# Patient Record
Sex: Female | Born: 1950
Health system: Southern US, Community
[De-identification: ages and names within clinical notes are randomized; demographics above are authoritative.]

## PROBLEM LIST (undated history)

## (undated) DIAGNOSIS — M7660 Achilles tendinitis, unspecified leg: Secondary | ICD-10-CM

## (undated) DIAGNOSIS — M1990 Unspecified osteoarthritis, unspecified site: Secondary | ICD-10-CM

## (undated) DIAGNOSIS — F32A Depression, unspecified: Secondary | ICD-10-CM

## (undated) DIAGNOSIS — M545 Low back pain, unspecified: Secondary | ICD-10-CM

## (undated) DIAGNOSIS — K219 Gastro-esophageal reflux disease without esophagitis: Secondary | ICD-10-CM

## (undated) DIAGNOSIS — J45909 Unspecified asthma, uncomplicated: Secondary | ICD-10-CM

## (undated) DIAGNOSIS — G4733 Obstructive sleep apnea (adult) (pediatric): Secondary | ICD-10-CM

## (undated) DIAGNOSIS — E662 Morbid (severe) obesity with alveolar hypoventilation: Secondary | ICD-10-CM

## (undated) DIAGNOSIS — E785 Hyperlipidemia, unspecified: Secondary | ICD-10-CM

## (undated) DIAGNOSIS — I10 Essential (primary) hypertension: Secondary | ICD-10-CM

## (undated) DIAGNOSIS — Z8669 Personal history of other diseases of the nervous system and sense organs: Secondary | ICD-10-CM

## (undated) DIAGNOSIS — F329 Major depressive disorder, single episode, unspecified: Secondary | ICD-10-CM

## (undated) DIAGNOSIS — F419 Anxiety disorder, unspecified: Secondary | ICD-10-CM

## (undated) HISTORY — DX: Unspecified osteoarthritis, unspecified site: M19.90

## (undated) HISTORY — DX: Major depressive disorder, single episode, unspecified: F32.9

## (undated) HISTORY — DX: Morbid (severe) obesity with alveolar hypoventilation: E66.2

## (undated) HISTORY — DX: Low back pain, unspecified: M54.50

## (undated) HISTORY — DX: Unspecified asthma, uncomplicated: J45.909

## (undated) HISTORY — DX: Hyperlipidemia, unspecified: E78.5

## (undated) HISTORY — DX: Depression, unspecified: F32.A

## (undated) HISTORY — DX: Low back pain: M54.5

## (undated) HISTORY — DX: Achilles tendinitis, unspecified leg: M76.60

## (undated) HISTORY — DX: Anxiety disorder, unspecified: F41.9

## (undated) HISTORY — DX: Gastro-esophageal reflux disease without esophagitis: K21.9

## (undated) HISTORY — PX: TUBAL LIGATION: SHX77

## (undated) HISTORY — DX: Essential (primary) hypertension: I10

## (undated) HISTORY — DX: Obstructive sleep apnea (adult) (pediatric): G47.33

---

## 1998-04-27 ENCOUNTER — Ambulatory Visit (HOSPITAL_COMMUNITY): Admission: RE | Admit: 1998-04-27 | Discharge: 1998-04-27 | Payer: Self-pay | Admitting: Family Medicine

## 1998-05-01 ENCOUNTER — Ambulatory Visit (HOSPITAL_COMMUNITY): Admission: RE | Admit: 1998-05-01 | Discharge: 1998-05-01 | Payer: Self-pay | Admitting: Family Medicine

## 2000-11-12 ENCOUNTER — Emergency Department (HOSPITAL_COMMUNITY): Admission: EM | Admit: 2000-11-12 | Discharge: 2000-11-12 | Payer: Self-pay | Admitting: *Deleted

## 2001-06-20 ENCOUNTER — Other Ambulatory Visit: Admission: RE | Admit: 2001-06-20 | Discharge: 2001-06-20 | Payer: Self-pay | Admitting: Family Medicine

## 2002-05-22 ENCOUNTER — Encounter: Payer: Self-pay | Admitting: Family Medicine

## 2002-05-22 ENCOUNTER — Ambulatory Visit (HOSPITAL_COMMUNITY): Admission: RE | Admit: 2002-05-22 | Discharge: 2002-05-22 | Payer: Self-pay | Admitting: Family Medicine

## 2002-07-24 ENCOUNTER — Emergency Department (HOSPITAL_COMMUNITY): Admission: EM | Admit: 2002-07-24 | Discharge: 2002-07-24 | Payer: Self-pay | Admitting: *Deleted

## 2002-07-24 ENCOUNTER — Encounter: Payer: Self-pay | Admitting: Emergency Medicine

## 2004-07-25 HISTORY — PX: CARDIAC CATHETERIZATION: SHX172

## 2004-12-21 ENCOUNTER — Encounter (INDEPENDENT_AMBULATORY_CARE_PROVIDER_SITE_OTHER): Payer: Self-pay | Admitting: Internal Medicine

## 2004-12-21 ENCOUNTER — Ambulatory Visit (HOSPITAL_COMMUNITY): Admission: RE | Admit: 2004-12-21 | Discharge: 2004-12-21 | Payer: Self-pay | Admitting: Family Medicine

## 2005-03-16 ENCOUNTER — Ambulatory Visit: Payer: Self-pay | Admitting: Family Medicine

## 2005-03-24 ENCOUNTER — Encounter (INDEPENDENT_AMBULATORY_CARE_PROVIDER_SITE_OTHER): Payer: Self-pay | Admitting: Internal Medicine

## 2005-04-04 ENCOUNTER — Encounter (INDEPENDENT_AMBULATORY_CARE_PROVIDER_SITE_OTHER): Payer: Self-pay | Admitting: Internal Medicine

## 2005-04-04 ENCOUNTER — Ambulatory Visit: Payer: Self-pay | Admitting: *Deleted

## 2005-04-04 ENCOUNTER — Ambulatory Visit: Payer: Self-pay | Admitting: Family Medicine

## 2005-04-04 ENCOUNTER — Inpatient Hospital Stay (HOSPITAL_COMMUNITY): Admission: EM | Admit: 2005-04-04 | Discharge: 2005-04-08 | Payer: Self-pay | Admitting: Emergency Medicine

## 2005-04-07 ENCOUNTER — Encounter (INDEPENDENT_AMBULATORY_CARE_PROVIDER_SITE_OTHER): Payer: Self-pay | Admitting: Internal Medicine

## 2005-04-07 ENCOUNTER — Ambulatory Visit: Payer: Self-pay | Admitting: Cardiology

## 2005-04-13 ENCOUNTER — Ambulatory Visit: Payer: Self-pay | Admitting: *Deleted

## 2005-04-15 ENCOUNTER — Ambulatory Visit: Payer: Self-pay | Admitting: Family Medicine

## 2005-04-18 ENCOUNTER — Ambulatory Visit: Payer: Self-pay | Admitting: Critical Care Medicine

## 2005-05-10 ENCOUNTER — Ambulatory Visit: Payer: Self-pay | Admitting: Psychiatry

## 2005-05-15 ENCOUNTER — Ambulatory Visit: Admission: RE | Admit: 2005-05-15 | Discharge: 2005-05-15 | Payer: Self-pay | Admitting: Critical Care Medicine

## 2005-05-25 ENCOUNTER — Ambulatory Visit: Payer: Self-pay | Admitting: Pulmonary Disease

## 2005-05-26 ENCOUNTER — Ambulatory Visit: Payer: Self-pay | Admitting: Critical Care Medicine

## 2005-06-13 ENCOUNTER — Ambulatory Visit: Payer: Self-pay | Admitting: Psychiatry

## 2005-06-28 ENCOUNTER — Ambulatory Visit: Payer: Self-pay | Admitting: Critical Care Medicine

## 2005-07-04 ENCOUNTER — Ambulatory Visit: Payer: Self-pay | Admitting: Psychiatry

## 2005-08-05 ENCOUNTER — Ambulatory Visit: Payer: Self-pay | Admitting: Critical Care Medicine

## 2005-08-08 ENCOUNTER — Ambulatory Visit: Payer: Self-pay | Admitting: Pulmonary Disease

## 2005-08-08 ENCOUNTER — Encounter (INDEPENDENT_AMBULATORY_CARE_PROVIDER_SITE_OTHER): Payer: Self-pay | Admitting: Internal Medicine

## 2005-08-08 ENCOUNTER — Ambulatory Visit: Admission: RE | Admit: 2005-08-08 | Discharge: 2005-08-08 | Payer: Self-pay | Admitting: Pulmonary Disease

## 2005-08-17 ENCOUNTER — Ambulatory Visit: Payer: Self-pay | Admitting: Pulmonary Disease

## 2005-08-29 ENCOUNTER — Ambulatory Visit: Payer: Self-pay | Admitting: Psychiatry

## 2005-08-30 ENCOUNTER — Encounter (INDEPENDENT_AMBULATORY_CARE_PROVIDER_SITE_OTHER): Payer: Self-pay | Admitting: Internal Medicine

## 2005-08-30 ENCOUNTER — Emergency Department (HOSPITAL_COMMUNITY): Admission: EM | Admit: 2005-08-30 | Discharge: 2005-08-30 | Payer: Self-pay | Admitting: Emergency Medicine

## 2005-09-06 ENCOUNTER — Ambulatory Visit: Payer: Self-pay | Admitting: Family Medicine

## 2005-09-14 ENCOUNTER — Encounter (INDEPENDENT_AMBULATORY_CARE_PROVIDER_SITE_OTHER): Payer: Self-pay | Admitting: Internal Medicine

## 2005-09-17 ENCOUNTER — Encounter (INDEPENDENT_AMBULATORY_CARE_PROVIDER_SITE_OTHER): Payer: Self-pay | Admitting: Internal Medicine

## 2005-09-19 ENCOUNTER — Ambulatory Visit: Payer: Self-pay | Admitting: Orthopedic Surgery

## 2005-09-21 ENCOUNTER — Ambulatory Visit: Payer: Self-pay | Admitting: Critical Care Medicine

## 2005-10-05 ENCOUNTER — Ambulatory Visit: Payer: Self-pay | Admitting: Family Medicine

## 2005-11-03 ENCOUNTER — Encounter (INDEPENDENT_AMBULATORY_CARE_PROVIDER_SITE_OTHER): Payer: Self-pay | Admitting: Internal Medicine

## 2005-11-07 ENCOUNTER — Encounter (INDEPENDENT_AMBULATORY_CARE_PROVIDER_SITE_OTHER): Payer: Self-pay | Admitting: Internal Medicine

## 2005-11-15 ENCOUNTER — Ambulatory Visit: Payer: Self-pay | Admitting: Internal Medicine

## 2006-01-03 ENCOUNTER — Ambulatory Visit: Payer: Self-pay | Admitting: Internal Medicine

## 2006-01-09 ENCOUNTER — Ambulatory Visit: Payer: Self-pay | Admitting: Critical Care Medicine

## 2006-03-17 ENCOUNTER — Telehealth (INDEPENDENT_AMBULATORY_CARE_PROVIDER_SITE_OTHER): Payer: Self-pay | Admitting: Internal Medicine

## 2006-03-29 ENCOUNTER — Encounter (INDEPENDENT_AMBULATORY_CARE_PROVIDER_SITE_OTHER): Payer: Self-pay | Admitting: Internal Medicine

## 2006-03-30 ENCOUNTER — Ambulatory Visit (HOSPITAL_COMMUNITY): Admission: RE | Admit: 2006-03-30 | Discharge: 2006-03-30 | Payer: Self-pay | Admitting: Internal Medicine

## 2006-03-30 ENCOUNTER — Encounter (INDEPENDENT_AMBULATORY_CARE_PROVIDER_SITE_OTHER): Payer: Self-pay | Admitting: Internal Medicine

## 2006-04-04 ENCOUNTER — Ambulatory Visit: Payer: Self-pay | Admitting: Internal Medicine

## 2006-04-05 ENCOUNTER — Encounter (INDEPENDENT_AMBULATORY_CARE_PROVIDER_SITE_OTHER): Payer: Self-pay | Admitting: Internal Medicine

## 2006-04-10 ENCOUNTER — Telehealth (INDEPENDENT_AMBULATORY_CARE_PROVIDER_SITE_OTHER): Payer: Self-pay | Admitting: Internal Medicine

## 2006-04-18 ENCOUNTER — Ambulatory Visit: Payer: Self-pay | Admitting: Internal Medicine

## 2006-04-20 ENCOUNTER — Ambulatory Visit (HOSPITAL_COMMUNITY): Payer: Self-pay | Admitting: Psychiatry

## 2006-04-25 ENCOUNTER — Emergency Department (HOSPITAL_COMMUNITY): Admission: EM | Admit: 2006-04-25 | Discharge: 2006-04-25 | Payer: Self-pay | Admitting: Emergency Medicine

## 2006-04-27 ENCOUNTER — Telehealth (INDEPENDENT_AMBULATORY_CARE_PROVIDER_SITE_OTHER): Payer: Self-pay | Admitting: Internal Medicine

## 2006-08-28 ENCOUNTER — Encounter: Payer: Self-pay | Admitting: Internal Medicine

## 2006-08-28 DIAGNOSIS — F329 Major depressive disorder, single episode, unspecified: Secondary | ICD-10-CM

## 2006-08-28 DIAGNOSIS — M1990 Unspecified osteoarthritis, unspecified site: Secondary | ICD-10-CM

## 2006-08-28 DIAGNOSIS — E785 Hyperlipidemia, unspecified: Secondary | ICD-10-CM | POA: Insufficient documentation

## 2006-08-28 DIAGNOSIS — J439 Emphysema, unspecified: Secondary | ICD-10-CM

## 2006-08-28 DIAGNOSIS — I10 Essential (primary) hypertension: Secondary | ICD-10-CM | POA: Insufficient documentation

## 2006-08-28 DIAGNOSIS — F411 Generalized anxiety disorder: Secondary | ICD-10-CM

## 2006-08-28 DIAGNOSIS — K219 Gastro-esophageal reflux disease without esophagitis: Secondary | ICD-10-CM | POA: Insufficient documentation

## 2006-08-28 DIAGNOSIS — F419 Anxiety disorder, unspecified: Secondary | ICD-10-CM | POA: Insufficient documentation

## 2006-08-28 DIAGNOSIS — M545 Low back pain: Secondary | ICD-10-CM

## 2006-09-05 ENCOUNTER — Emergency Department (HOSPITAL_COMMUNITY): Admission: EM | Admit: 2006-09-05 | Discharge: 2006-09-05 | Payer: Self-pay | Admitting: Emergency Medicine

## 2006-12-05 ENCOUNTER — Encounter (INDEPENDENT_AMBULATORY_CARE_PROVIDER_SITE_OTHER): Payer: Self-pay | Admitting: Internal Medicine

## 2006-12-06 ENCOUNTER — Telehealth (INDEPENDENT_AMBULATORY_CARE_PROVIDER_SITE_OTHER): Payer: Self-pay | Admitting: Internal Medicine

## 2006-12-27 ENCOUNTER — Ambulatory Visit: Payer: Self-pay | Admitting: Critical Care Medicine

## 2007-01-04 ENCOUNTER — Ambulatory Visit: Payer: Self-pay | Admitting: Internal Medicine

## 2007-01-29 ENCOUNTER — Ambulatory Visit: Payer: Self-pay | Admitting: Pulmonary Disease

## 2007-02-21 ENCOUNTER — Ambulatory Visit: Payer: Self-pay | Admitting: Cardiovascular Disease

## 2007-02-22 ENCOUNTER — Ambulatory Visit (HOSPITAL_COMMUNITY): Admission: RE | Admit: 2007-02-22 | Discharge: 2007-02-22 | Payer: Self-pay | Admitting: Cardiovascular Disease

## 2007-02-22 ENCOUNTER — Ambulatory Visit: Payer: Self-pay | Admitting: Cardiology

## 2007-04-30 ENCOUNTER — Encounter (INDEPENDENT_AMBULATORY_CARE_PROVIDER_SITE_OTHER): Payer: Self-pay | Admitting: Internal Medicine

## 2007-06-21 ENCOUNTER — Emergency Department (HOSPITAL_COMMUNITY): Admission: EM | Admit: 2007-06-21 | Discharge: 2007-06-21 | Payer: Self-pay | Admitting: Emergency Medicine

## 2007-07-26 ENCOUNTER — Encounter: Payer: Self-pay | Admitting: Family Medicine

## 2007-10-29 ENCOUNTER — Encounter: Payer: Self-pay | Admitting: Internal Medicine

## 2007-10-29 ENCOUNTER — Ambulatory Visit: Payer: Self-pay | Admitting: Internal Medicine

## 2007-10-30 ENCOUNTER — Telehealth (INDEPENDENT_AMBULATORY_CARE_PROVIDER_SITE_OTHER): Payer: Self-pay | Admitting: *Deleted

## 2007-10-30 LAB — CONVERTED CEMR LAB
Albumin: 4 g/dL (ref 3.5–5.2)
BUN: 7 mg/dL (ref 6–23)
Calcium: 9.1 mg/dL (ref 8.4–10.5)
Creatinine, Ser: 0.76 mg/dL (ref 0.40–1.20)
Glucose, Bld: 102 mg/dL — ABNORMAL HIGH (ref 70–99)
HDL: 44 mg/dL (ref >39)
Sodium: 144 meq/L (ref 135–145)
Total CHOL/HDL Ratio: 6.8
Triglycerides: 213 mg/dL — ABNORMAL HIGH (ref <150)

## 2007-11-01 ENCOUNTER — Ambulatory Visit (HOSPITAL_COMMUNITY): Admission: RE | Admit: 2007-11-01 | Discharge: 2007-11-01 | Payer: Self-pay | Admitting: Internal Medicine

## 2007-11-06 ENCOUNTER — Telehealth (INDEPENDENT_AMBULATORY_CARE_PROVIDER_SITE_OTHER): Payer: Self-pay | Admitting: *Deleted

## 2007-11-12 ENCOUNTER — Encounter (INDEPENDENT_AMBULATORY_CARE_PROVIDER_SITE_OTHER): Payer: Self-pay | Admitting: Internal Medicine

## 2007-11-15 ENCOUNTER — Ambulatory Visit: Payer: Self-pay | Admitting: Internal Medicine

## 2007-11-20 ENCOUNTER — Telehealth (INDEPENDENT_AMBULATORY_CARE_PROVIDER_SITE_OTHER): Payer: Self-pay | Admitting: *Deleted

## 2007-11-30 ENCOUNTER — Ambulatory Visit: Payer: Self-pay | Admitting: Internal Medicine

## 2007-12-10 ENCOUNTER — Ambulatory Visit: Payer: Self-pay | Admitting: Internal Medicine

## 2007-12-11 ENCOUNTER — Encounter (INDEPENDENT_AMBULATORY_CARE_PROVIDER_SITE_OTHER): Payer: Self-pay | Admitting: Internal Medicine

## 2008-03-04 ENCOUNTER — Telehealth (INDEPENDENT_AMBULATORY_CARE_PROVIDER_SITE_OTHER): Payer: Self-pay | Admitting: *Deleted

## 2008-03-17 ENCOUNTER — Ambulatory Visit: Payer: Self-pay | Admitting: Internal Medicine

## 2008-03-18 ENCOUNTER — Ambulatory Visit: Payer: Self-pay | Admitting: Critical Care Medicine

## 2008-03-19 ENCOUNTER — Encounter: Payer: Self-pay | Admitting: Critical Care Medicine

## 2008-03-28 ENCOUNTER — Encounter: Payer: Self-pay | Admitting: Critical Care Medicine

## 2008-03-28 ENCOUNTER — Telehealth (INDEPENDENT_AMBULATORY_CARE_PROVIDER_SITE_OTHER): Payer: Self-pay | Admitting: *Deleted

## 2008-03-28 ENCOUNTER — Telehealth: Payer: Self-pay | Admitting: Critical Care Medicine

## 2008-03-28 ENCOUNTER — Ambulatory Visit: Payer: Self-pay | Admitting: Critical Care Medicine

## 2008-05-08 ENCOUNTER — Ambulatory Visit: Payer: Self-pay | Admitting: Critical Care Medicine

## 2008-05-08 DIAGNOSIS — G4733 Obstructive sleep apnea (adult) (pediatric): Secondary | ICD-10-CM

## 2008-06-03 ENCOUNTER — Ambulatory Visit: Payer: Self-pay | Admitting: Pulmonary Disease

## 2008-06-04 ENCOUNTER — Encounter (INDEPENDENT_AMBULATORY_CARE_PROVIDER_SITE_OTHER): Payer: Self-pay | Admitting: Internal Medicine

## 2008-06-09 ENCOUNTER — Ambulatory Visit: Admission: RE | Admit: 2008-06-09 | Discharge: 2008-06-09 | Payer: Self-pay | Admitting: Pulmonary Disease

## 2008-06-09 ENCOUNTER — Encounter: Payer: Self-pay | Admitting: Pulmonary Disease

## 2008-06-16 ENCOUNTER — Emergency Department (HOSPITAL_COMMUNITY): Admission: EM | Admit: 2008-06-16 | Discharge: 2008-06-16 | Payer: Self-pay | Admitting: Emergency Medicine

## 2008-06-16 ENCOUNTER — Ambulatory Visit: Payer: Self-pay | Admitting: Pulmonary Disease

## 2008-06-16 DIAGNOSIS — E662 Morbid (severe) obesity with alveolar hypoventilation: Secondary | ICD-10-CM | POA: Insufficient documentation

## 2008-06-16 DIAGNOSIS — E678 Other specified hyperalimentation: Secondary | ICD-10-CM

## 2008-06-17 ENCOUNTER — Ambulatory Visit: Payer: Self-pay | Admitting: Internal Medicine

## 2008-06-18 LAB — CONVERTED CEMR LAB
BUN: 9 mg/dL (ref 6–23)
CO2: 29 meq/L (ref 19–32)
Calcium: 9.2 mg/dL (ref 8.4–10.5)
Chloride: 99 meq/L (ref 96–112)
Creatinine, Ser: 0.91 mg/dL (ref 0.40–1.20)
Glucose, Bld: 86 mg/dL (ref 70–99)

## 2008-07-02 ENCOUNTER — Encounter: Payer: Self-pay | Admitting: Pulmonary Disease

## 2008-10-13 ENCOUNTER — Ambulatory Visit: Payer: Self-pay | Admitting: Internal Medicine

## 2008-10-13 DIAGNOSIS — B372 Candidiasis of skin and nail: Secondary | ICD-10-CM | POA: Insufficient documentation

## 2008-10-28 ENCOUNTER — Ambulatory Visit: Payer: Self-pay | Admitting: Internal Medicine

## 2008-10-28 DIAGNOSIS — M7660 Achilles tendinitis, unspecified leg: Secondary | ICD-10-CM

## 2009-01-12 ENCOUNTER — Ambulatory Visit: Payer: Self-pay | Admitting: Internal Medicine

## 2009-01-13 ENCOUNTER — Encounter (INDEPENDENT_AMBULATORY_CARE_PROVIDER_SITE_OTHER): Payer: Self-pay | Admitting: Internal Medicine

## 2009-01-13 LAB — CONVERTED CEMR LAB
AST: 13 units/L (ref 0–37)
BUN: 11 mg/dL (ref 6–23)
Calcium: 8.8 mg/dL (ref 8.4–10.5)
Creatinine, Ser: 0.86 mg/dL (ref 0.40–1.20)
HDL: 43 mg/dL (ref >39)
LDL Cholesterol: 158 mg/dL — ABNORMAL HIGH (ref 0–99)
Sodium: 142 meq/L (ref 135–145)
Total CHOL/HDL Ratio: 5.4
VLDL: 32 mg/dL (ref 0–40)

## 2009-06-08 ENCOUNTER — Telehealth (INDEPENDENT_AMBULATORY_CARE_PROVIDER_SITE_OTHER): Payer: Self-pay | Admitting: *Deleted

## 2009-07-15 ENCOUNTER — Ambulatory Visit: Payer: Self-pay | Admitting: Critical Care Medicine

## 2009-07-15 ENCOUNTER — Telehealth (INDEPENDENT_AMBULATORY_CARE_PROVIDER_SITE_OTHER): Payer: Self-pay | Admitting: *Deleted

## 2009-09-17 ENCOUNTER — Emergency Department (HOSPITAL_COMMUNITY): Admission: EM | Admit: 2009-09-17 | Discharge: 2009-09-17 | Payer: Self-pay | Admitting: Emergency Medicine

## 2009-11-03 ENCOUNTER — Telehealth (INDEPENDENT_AMBULATORY_CARE_PROVIDER_SITE_OTHER): Payer: Self-pay | Admitting: *Deleted

## 2009-12-01 ENCOUNTER — Ambulatory Visit: Payer: Self-pay | Admitting: Pulmonary Disease

## 2010-04-23 ENCOUNTER — Emergency Department (HOSPITAL_COMMUNITY)
Admission: EM | Admit: 2010-04-23 | Discharge: 2010-04-23 | Payer: Self-pay | Source: Home / Self Care | Admitting: Emergency Medicine

## 2010-07-25 ENCOUNTER — Emergency Department (HOSPITAL_COMMUNITY)
Admission: EM | Admit: 2010-07-25 | Discharge: 2010-07-25 | Payer: Self-pay | Source: Home / Self Care | Admitting: Emergency Medicine

## 2010-08-15 ENCOUNTER — Encounter: Payer: Self-pay | Admitting: Internal Medicine

## 2010-08-16 ENCOUNTER — Encounter: Payer: Self-pay | Admitting: Internal Medicine

## 2010-08-24 ENCOUNTER — Ambulatory Visit: Admit: 2010-08-24 | Payer: Self-pay | Admitting: Critical Care Medicine

## 2010-08-24 NOTE — Miscellaneous (Signed)
Summary: [facility] Mast   Imported By: Bubba Hales 12/08/2009 10:05:56  _____________________________________________________________________  External Attachment:    Type:   Image     Comment:   External Document

## 2010-08-24 NOTE — Letter (Signed)
Summary: rpc chart  rpc chart   Imported By: Eliezer Mccoy 05/10/2010 10:39:24  _____________________________________________________________________  External Attachment:    Type:   Image     Comment:   External Document

## 2010-08-24 NOTE — Letter (Signed)
Summary: CMN request for PAP/[facility]  CMN request for PAP/[facility]   Imported By: Phillis Knack 12/04/2009 08:53:05  _____________________________________________________________________  External Attachment:    Type:   Image     Comment:   External Document
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
## 2010-08-24 NOTE — Progress Notes (Signed)
Summary: Appt needed with Dr. Halford Chessman for OSA- pt returned call  Phone Note Outgoing Call   Call placed by: Francesca Jewett CMA,  November 03, 2009 8:03 AM Call placed to: Patient Summary of Call: Patient last seen 05/2008 for OSA and will need an appt with Dr. Halford Chessman. CMN received from [place].  ATC x1. Unable to leave a msg. Will try again later.Francesca Jewett [facility]  November 03, 2009 11:25 AM Initial call taken by: Francesca Jewett CMA,  November 03, 2009 8:03 AM  Follow-up for Phone Call        PT RETURNED CALL. I HAVE SCHEDULED A F/U APPT W/ VS FOR 12/01/09. Cooper Render, CNA  November 03, 2009 3:08 PM

## 2010-08-25 ENCOUNTER — Telehealth: Payer: Self-pay | Admitting: Critical Care Medicine

## 2010-09-01 NOTE — Progress Notes (Signed)
Summary: nos appt  Phone Note Call from Patient   Caller: [email or web address]  Call For: wright Summary of Call: Rsc nos from 1/31 to 3/14. Initial call taken by: Netta Neat,  August 25, 2010 9:34 AM

## 2010-10-06 ENCOUNTER — Encounter: Payer: Self-pay | Admitting: Critical Care Medicine

## 2010-10-06 ENCOUNTER — Ambulatory Visit (INDEPENDENT_AMBULATORY_CARE_PROVIDER_SITE_OTHER): Payer: Medicare Other | Admitting: Critical Care Medicine

## 2010-10-06 DIAGNOSIS — G4733 Obstructive sleep apnea (adult) (pediatric): Secondary | ICD-10-CM

## 2010-10-06 DIAGNOSIS — J449 Chronic obstructive pulmonary disease, unspecified: Secondary | ICD-10-CM

## 2010-10-06 DIAGNOSIS — J4489 Other specified chronic obstructive pulmonary disease: Secondary | ICD-10-CM

## 2010-10-06 NOTE — Patient Instructions (Addendum)
No change in medications. Return in        6 months        

## 2010-10-06 NOTE — Progress Notes (Unsigned)
  Subjective:    Patient ID: [name], female    DOB: 07/26/1950, 60 y.o.   MRN: [id number]  HPI [name] is a 60 year old African American female with a history of obstructive sleep apnea, chronic obstructive lung disease, asthmatic bronchitis.  No real chest pain.  Dyspnea is at baseline.  No real mucus.  Cough is dry.  No real wheeze.  No real edema.   Review of Systems ROS     Constitutional:   No weight loss, night sweats,  Fevers, chills, fatigue, lassitude. HEENT:   No headaches,  Difficulty swallowing,  Tooth/dental problems,  Sore throat,                No sneezing, itching, ear ache, nasal congestion, post nasal drip,   CV:  No chest pain,  Orthopnea, PND, swelling in lower extremities, anasarca, dizziness, palpitations  GI  No heartburn, indigestion, abdominal pain, nausea, vomiting, diarrhea, change in bowel habits, loss of appetite  Resp: Notes  shortness of breath with exertion but not  at rest.  No excess mucus, no productive cough,  Occ. non-productive cough,  No coughing up of blood.  No change in color of mucus.  No wheezing.  No chest wall deformity  Skin: no rash or lesions.  GU: no dysuria, change in color of urine, no urgency or frequency.  No flank pain.  MS:  Notes  joint pain  But no  swelling.  No decreased range of motion.  No back pain.  Psych:  No change in mood or affect. No depression or anxiety.  No memory loss.                    Objective:   Physical Exam   Gen: Pleasant, well-nourished, in no distress,  normal affect  ENT: No lesions,  mouth clear,  oropharynx clear, no postnasal drip  Neck: No JVD, no TMG, no carotid bruits  Lungs: No use of accessory muscles, no dullness to percussion, clear without rales or rhonchi  Cardiovascular: RRR, heart sounds normal, no murmur or gallops, no peripheral edema  Abdomen: soft and NT, no HSM,  BS normal  Musculoskeletal: No deformities, no cyanosis or  clubbing  Neuro: alert, non focal  Skin: Warm, no lesions or rashes      Assessment & Plan:

## 2010-10-11 ENCOUNTER — Emergency Department (HOSPITAL_COMMUNITY)
Admission: EM | Admit: 2010-10-11 | Discharge: 2010-10-12 | Disposition: A | Discharge status: Home / Self Care | Payer: Medicare Other | Attending: Emergency Medicine | Admitting: Emergency Medicine

## 2010-10-11 ENCOUNTER — Emergency Department (HOSPITAL_COMMUNITY): Payer: Medicare Other

## 2010-10-11 DIAGNOSIS — I10 Essential (primary) hypertension: Secondary | ICD-10-CM | POA: Insufficient documentation

## 2010-10-11 DIAGNOSIS — F411 Generalized anxiety disorder: Secondary | ICD-10-CM | POA: Insufficient documentation

## 2010-10-11 DIAGNOSIS — K219 Gastro-esophageal reflux disease without esophagitis: Secondary | ICD-10-CM | POA: Insufficient documentation

## 2010-10-11 DIAGNOSIS — M7989 Other specified soft tissue disorders: Secondary | ICD-10-CM | POA: Insufficient documentation

## 2010-10-11 DIAGNOSIS — L02619 Cutaneous abscess of unspecified foot: Secondary | ICD-10-CM | POA: Insufficient documentation

## 2010-10-11 DIAGNOSIS — L03119 Cellulitis of unspecified part of limb: Secondary | ICD-10-CM | POA: Insufficient documentation

## 2010-10-11 DIAGNOSIS — J449 Chronic obstructive pulmonary disease, unspecified: Secondary | ICD-10-CM | POA: Insufficient documentation

## 2010-10-11 DIAGNOSIS — Z862 Personal history of diseases of the blood and blood-forming organs and certain disorders involving the immune mechanism: Secondary | ICD-10-CM | POA: Insufficient documentation

## 2010-10-11 DIAGNOSIS — J4489 Other specified chronic obstructive pulmonary disease: Secondary | ICD-10-CM | POA: Insufficient documentation

## 2010-10-11 DIAGNOSIS — B353 Tinea pedis: Secondary | ICD-10-CM | POA: Insufficient documentation

## 2010-10-11 DIAGNOSIS — M79609 Pain in unspecified limb: Secondary | ICD-10-CM | POA: Insufficient documentation

## 2010-10-11 DIAGNOSIS — Z8639 Personal history of other endocrine, nutritional and metabolic disease: Secondary | ICD-10-CM | POA: Insufficient documentation

## 2010-10-11 LAB — DIFFERENTIAL
Basophils Absolute: 0.1 10*3/uL (ref 0.0–0.1)
Eosinophils Relative: 6 % — ABNORMAL HIGH (ref 0–5)
Lymphocytes Relative: 36 % (ref 12–46)
Lymphs Abs: 3.1 10*3/uL (ref 0.7–4.0)
Monocytes Absolute: 0.7 10*3/uL (ref 0.1–1.0)
Neutro Abs: 4.4 10*3/uL (ref 1.7–7.7)

## 2010-10-11 LAB — CBC
HCT: 39.6 % (ref 36.0–46.0)
Hemoglobin: 13.4 g/dL (ref 12.0–15.0)
MCHC: 33.8 g/dL (ref 30.0–36.0)
MCV: 86.7 fL (ref 78.0–100.0)
RDW: 14.3 % (ref 11.5–15.5)

## 2010-10-11 LAB — BASIC METABOLIC PANEL
Chloride: 104 mEq/L (ref 96–112)
GFR calc Af Amer: >60 mL/min (ref >60)
Potassium: 4 mEq/L (ref 3.5–5.1)
Sodium: 140 mEq/L (ref 135–145)

## 2010-10-12 NOTE — Assessment & Plan Note (Signed)
Summary: Pulmonary OV:  See EPIC note for documentation   Copy to:  Asencion Noble Primary Provider/Referring Provider:  Elsie Lincoln   History of Present Illness: 60 yo female with OSA, OHS on CPAP 7 cm and 2 liters oxygen.  She has a nasal mask.  She goes to bed at 10pm, and wakes up at 6 am.  She is using her mask for the whole night.  She uses the bathroom once at night.  She feels good in the morning.  She will sometimes get tired in the afternoon, but not as bad as before she started CPAP.  She is not having any problems with her mask.  October 06, 2010 3:27 PM See EPIC documentation for note today  Clinical Reports Reviewed:  PFT's:  03/28/2008: DLCO %Predicted:  32 FEF 25/75 %Predicted:  75 FEV1 %Predicted:  70 FVC %Predicted:  63 Post Spirometry FEF 25/75 %Predicted:  82 Post Spirometry FEV1 %Predicted:  67 Post Spirometry FVC %Predicted:  57 RV %Predicted:  48 TLC %Predicted:  59   Allergies: No Known Drug Allergies  Past Pulmonary History:  Pulmonary History: Pulmonary Function Test  Date: 03/28/2008 Height (in.): 65 Gender: Female  Pre-Spirometry  FVC     Value: 2.05 L/min   Pred: 3.23 L/min     % Pred: 63 % FEV1     Value: 1.69 L     Pred: 2.40 L     % Pred: 70 % FEV1/FVC   Value: 83 %     Pred: 74 %     FEF 25-75   Value: 2.02 L/min   Pred: 2.71 L/min     % Pred: 75 %  Post-Spirometry  FVC     Value: 1.85 L/min   Pred: 3.23 L/min     % Pred: 57 % FEV1     Value: 1.60 L     Pred: 2.40 L     % Pred: 67 % FEV1/FVC   Value: 87 %     Pred: 74 %     FEF 25-75   Value: 2.23 L/min   Pred: 2.71 L/min     % Pred: 82 %  Lung Volumes  TLC     Value: 3.03 L   % Pred: 59 % RV     Value: 0.92 L   % Pred: 48 % DLCO     Value: 9.0 %   % Pred: 32 % DLCO/VA   Value: 4.84 %   % Pred: 127 %  Comments:  severe restrictive defect and severe reduction in diffusing capacity   Other Orders: Est. Patient Level III [id number]) Spirometry w/Graph (94010)

## 2010-10-14 ENCOUNTER — Emergency Department (HOSPITAL_COMMUNITY): Payer: Medicare Other

## 2010-10-14 ENCOUNTER — Inpatient Hospital Stay (HOSPITAL_COMMUNITY)
Admission: RE | Admit: 2010-10-14 | Discharge: 2010-10-15 | DRG: 554 | Disposition: A | Discharge status: Home / Self Care | Payer: Medicare Other | Source: Ambulatory Visit | Attending: Internal Medicine | Admitting: Internal Medicine

## 2010-10-14 DIAGNOSIS — M109 Gout, unspecified: Principal | ICD-10-CM | POA: Diagnosis present

## 2010-10-14 DIAGNOSIS — J449 Chronic obstructive pulmonary disease, unspecified: Secondary | ICD-10-CM | POA: Diagnosis present

## 2010-10-14 DIAGNOSIS — I10 Essential (primary) hypertension: Secondary | ICD-10-CM | POA: Diagnosis present

## 2010-10-14 DIAGNOSIS — J4489 Other specified chronic obstructive pulmonary disease: Secondary | ICD-10-CM | POA: Diagnosis present

## 2010-10-14 DIAGNOSIS — K219 Gastro-esophageal reflux disease without esophagitis: Secondary | ICD-10-CM | POA: Diagnosis present

## 2010-10-14 DIAGNOSIS — F411 Generalized anxiety disorder: Secondary | ICD-10-CM | POA: Diagnosis present

## 2010-10-14 LAB — BASIC METABOLIC PANEL
BUN: 6 mg/dL (ref 6–23)
CO2: 30 mEq/L (ref 19–32)
Chloride: 102 mEq/L (ref 96–112)
Creatinine, Ser: 0.84 mg/dL (ref 0.4–1.2)
GFR calc Af Amer: >60 mL/min (ref >60)
Potassium: 3.8 mEq/L (ref 3.5–5.1)

## 2010-10-14 LAB — CBC
Hemoglobin: 12.9 g/dL (ref 12.0–15.0)
MCH: 28.7 pg (ref 26.0–34.0)
MCV: 87.3 fL (ref 78.0–100.0)
RBC: 4.5 MIL/uL (ref 3.87–5.11)
WBC: 8.7 10*3/uL (ref 4.0–10.5)

## 2010-10-14 LAB — DIFFERENTIAL
Lymphs Abs: 2.4 10*3/uL (ref 0.7–4.0)
Monocytes Relative: 10 % (ref 3–12)
Neutro Abs: 5 10*3/uL (ref 1.7–7.7)
Neutrophils Relative %: 58 % (ref 43–77)

## 2010-10-16 NOTE — H&P (Signed)
NAME:  [name], [name]             ACCOUNT NO.:  [id number]  MEDICAL RECORD NO.:  [id number]           PATIENT TYPE:  E  LOCATION:  APED                          FACILITY:  [facility]  PHYSICIAN:  Orvan Falconer, MD           DATE OF BIRTH:  February 01, 1951  DATE OF ADMISSION:  10/14/2010 DATE OF DISCHARGE:  LH                             HISTORY & PHYSICAL   PRIMARY CARE PHYSICIAN:  Sherrilee Gilles. Gerarda Fraction, MD  ADVANCE DIRECTIVE:  Full code.  REASON FOR ADMISSION:  Swelling of the right foot.  HISTORY OF PRESENT ILLNESS:  This is a 60 year old female with history of COPD, sleep apnea, hypertension, anxiety, gout, GERD, presented to the emergency room with complaint of right first MTP joint pain, swelling, and redness.  She was diagnosed with cellulitis by primary care physician and was given Augmentin.  She has been compliant with this medication, but the swelling has gone up.  The swelling has gotten to the ankle and extended toward the lateral malleolus as well.  Workup in emergency room showed normal white count of 8700, and that she is afebrile.  Her creatinine is normal at 0.84.  Her chest x-ray is clear. EKG showed normal sinus rhythm without any acute ST-T changes. Hospitalist was asked to admit the patient for suspicious of cellulitis.  PAST MEDICAL HISTORY:  COPD, hypertension and anxiety, GERD, gout, tendonitis, sleep apnea on 12-cm of CPAP machine.  FAMILY HISTORY:  Thyroid problem.  ALLERGIES:  No known drug allergies.  CURRENT MEDICATIONS:  Augmentin twice a day.  REVIEW OF SYSTEMS:  She has no fever, chills, nausea, vomiting, chest pain, or shortness of breath.  PHYSICAL EXAMINATION:  VITAL SIGNS:  Blood pressure 159/81, pulse of 71, respiratory rate of 20, temp 98.6. GENERAL:  She is alert and oriented and she is in no apparent distress. She has facial symmetry.  Speech is fluent.  Tongue is midline.  Uvula elevated with phonation. CARDIAC:  Revealed S1 and S2 regular.  I did  not hear any murmur, rub, or gallop. LUNGS:  Clear bilaterally without any rales. ABDOMEN:  Obese, nondistended, nontender. EXTREMITIES:  Show 1+ nonpitting edema.  She has swelling and pain with redness localized mainly of the first MTP joint.  She has swelling over the lateral malleolus and slight redness.  She has no severe erythema. The exam is consistent more with gout and cellulitis. NEUROLOGICAL AND PSYCHIATRIC:  Unremarkable as well.  OBJECTIVE FINDINGS:  Chest x-ray, low-volume without acute disease. Serum sodium 139, potassium 3.8, creatinine 0.84, glucose of 97, white count 8700, hemoglobin 12.9, platelet count of 245,000.  No uric acid performed.  IMPRESSION:  This is a 60 year old female who presented with acute swelling of the first metatarsophalangeal joint.  I think this is acute gout that is untreated.  The reason why Augmentin did not help her because I did not think she had cellulitis in the first place.  She was given vancomycin, but we will discontinue that.  We will treat her with indomethacin 50 mg t.i.d. with food along with Solu-Cortef 100 mg IV q.8 hours x3 dosages.  She will be given intravenous fluid.  Her blood pressures is elevated, and I will give her Diovan 160 mg as she had taken this before.  We will give low-dose colchicine at 0.3 mg b.i.d. Because of the acute nature of her disease, allopurinol will not be started at this time.  She is stable, will be admitted to [facility] Team 2.  She is a full code.     Orvan Falconer, MD     PL/MEDQ  D:  10/15/2010  T:  10/15/2010  Job:  [id number]  Electronically Signed by Orvan Falconer  on 10/16/2010 09:29:53 PM

## 2010-10-16 NOTE — Discharge Summary (Signed)
  NAME:  [name], [name]             ACCOUNT NO.:  [id number]  MEDICAL RECORD NO.:  [id number]           PATIENT TYPE:  I  LOCATION:  [id number]                          FACILITY:  [facility]  PHYSICIAN:  Doree Albee, M.D.DATE OF BIRTH:  01-04-1951  DATE OF ADMISSION:  10/14/2010 DATE OF DISCHARGE:  03/23/2012LH                              DISCHARGE SUMMARY   PRIMARY CARE PHYSICIAN:  Sherrilee Gilles. Gerarda Fraction, MD.  FINAL DISCHARGE DIAGNOSES: 1. Acute left first metatarsophalangeal joint gout. 2. Chronic obstructive pulmonary disease, stable. 3. Hypertension. 4. Anxiety. 5. Gastroesophageal reflux disease.  CONDITION ON DISCHARGE:  Stable.  MEDICATIONS ON DISCHARGE: 1. Colchicine 0.3 mg b.i.d. for 5 days. 2. Indomethacin 50 mg t.i.d. for 10 days. 3. Omeprazole 20 mg daily. 4. Aspirin 81 mg daily. 5. Continue and finish course of Augmentin 875 mg b.i.d. 6. Oxycodone/acetaminophen 5/325 one to two tablets every 6 hours     p.r.n.  HISTORY:  This 60 year old lady was admitted yesterday with swelling of the left foot and particularly the first MTP joint.  Please see initial history and physical examination done by Dr. Raynelle Chary.  HOSPITAL PROGRESS:  The patient was admitted and was put on intravenous steroids as well as indomethacin and colchicine.  Today in the morning, she feels much improved.  I agree that this looks like gout as opposed to cellulitis.  I think it is reasonable to finish the course of Augmentin that she was prescribed by the primary care physician, but also I am going to send her home on indomethacin and colchicine.  She says she does not tolerate steroids very well orally and this could certainly be an option if other two measures did not help.  PHYSICAL EXAMINATION:  GENERAL:  She looks well.  She is not in acute pain. VITAL SIGNS:  Temperature 98.1, blood pressure 145/78, pulse 80, saturation 96% on 2 liters of oxygen. HEART:  Heart sounds are present and  normal. LUNGS:  Lung fields clear. EXTREMITIES:  Examination of left foot shows typical gout in the first metatarsophalangeal joint.  LAB WORK:  Shows hemoglobin 12.9, white blood cell count 8.7, platelets 245.  Sodium 139, potassium 3.8, bicarbonate 30, BUN 6, creatinine 0.84.  DISPOSITION:  The patient is stable to be discharged home with the above medications and she will follow up with her primary care physician early next week.     Doree Albee, M.D.     NCG/MEDQ  D:  10/15/2010  T:  10/15/2010  Job:  [id number]  cc:   Sherrilee Gilles. Gerarda Fraction, MD Fax: [phone]  Electronically Signed by Hurshel Party M.D. on 10/16/2010 05:36:27 PM

## 2010-10-20 LAB — CULTURE, BLOOD (ROUTINE X 2)

## 2010-12-07 NOTE — Assessment & Plan Note (Signed)
[facility] OFFICE NOTE   NAME:Thinnes, MAJEL MELO                    MRN:          [id number]  DATE:02/21/2007                            DOB:          31-Aug-1950    REFERRING PHYSICIAN:  Burnett Harry. Joya Gaskins, MD, [facility]   [name] is a 60 year old patient referred by Dr. Joya Gaskins for chest  pain.   The patient sees Dr. Joya Gaskins for significant sleep apnea.  She has been  on home O2 over the last couple of years.  She has a very sedentary  lifestyle.  She is a previous smoker having quit in 2006.   The patient describes atypical chest pain.  It is sharp pain in the  center of her chest.  It is worse when she rotates her upper body and  can sometimes be worse when she coughs.   In regards to her breathing it is at baseline.  She is supposed to wear  her oxygen continuously but does not.  I do not have any recent PFT's or  ABG's on the patient.   The patient has previously had a Myoview in September 2006.   It was read as abnormal with moderate anterior wall defect.  It was done  with dobutamine due to the patient's COPD.  However, she then had a  followup heart catheterization done in September 2006 by Dr. Olevia Perches.   At the time she had totally normal coronary arteries with an EF 60%.   Note was made that she had moderate elevation in her PA pressures of  62/27 and with a wedge pressure of 20 indicating primary lung disease.   This has not been followed up.   The patient's chest pain has not been particularly progressive.  It has  been constant over the last two months.  It is not associated with  diaphoresis, syncope or palpitations.  There is no radiation.  There is  nothing that she can do to make it better other than stop the twisting  motion of her upper torso.   She has not tried nonsteroidals for it and does not take nitroglycerin.   REVIEW OF SYSTEMS:  Otherwise, negative.   PAST MEDICAL  HISTORY:  Primarily remarkable for sleep apnea and COPD,  previous smoking, hypertension, and previous tubal ligations.   ALLERGIES:  She has no known allergies.   SOCIAL HISTORY:  Her mother is still alive.  Her father died at age 79  of double pneumonia.  She had a brother who died in his 39s of diabetes.   The patient is happily married.  She has three children.  She is  extremely sedentary.  She was told not to do much exercise due to her  lung problems.  She primarily reads and watches TV.  She is  significantly overweight.  She quit smoking in 2006 and does not drink.   MEDICATIONS:  1. Atrovent 2 puffs q.i.d.  2. Nebulizers at home.  3. Oxygen at home.  She is not aware of the exact liters per minute.  4. Diovan 160.  5. Aspirin a day.   PHYSICAL  EXAMINATION:  GENERAL:  A middle-aged black female in no  distress.  Affect is appropriate.  VITAL SIGNS:  Weight is 266, respiratory rate 14, blood pressure 120/80,  pulse 72 and regular.  HEENT:  Normal.  NECK:  Supple.  There is no lymphadenopathy, no thyromegaly, no JVP  elevation, no bruits.  LUNGS:  Clear with no active wheezing and decreased diaphragmatic  motion.  HEART:  There is an S1, S2, distant heart sounds.  PMI is not palpable.  ABDOMEN:  Bowel sounds are positive.  There is no tenderness, no  hepatosplenomegaly, no hepatojugular reflux,  no AAA, no bruits.  EXTREMITIES:  Distal pulses are intact.  Femorals are +3 bilaterally and  deep.  There are no bruits.  PT's are +2.  There is trace lower  extremity edema bilaterally.  NEUROLOGICAL:  Nonfocal.  There are no focal abnormalities and no  muscular weakness.   Her EKG shows sinus rhythm with occasional PVC's, nonspecific ST-T wave  changes.   IMPRESSION:  1. Chest pain atypical not likely to be coronary disease, previously      normal catheterization in 2006.  I do not think that the patient      should have another stress test.  Her symptoms are very  typical.      She had a false positive Myoview before, and it had to be done with      dobutamine.  Clearly she is likely to have another false positive      Myoview, and I do not think she needs a heart catheterization.  2. Occasional premature ventricular contractions likely related to      chronic hypoxemia and her sleep apnea.  No evidence of malignant      arrhythmia.  No palpitations or syncope.  At this time I will just      watch this.  If she were to develop any palpitations she could have      a CardioNet.  3. Hypertension.  Continue low salt diet.  She needs to lose weight.      Continue Diovan at 160 a day.  Follow up with Dr. Jonna Munro, her      primary care M.D.  4. Sleep apnea and chronic obstructive pulmonary disease.  Follow up      with Dr. Joya Gaskins.  Continue home O2.  We will make sure that she      gets her Pneumovax and influenza vaccines.  5. Chest pain and dyspnea.  In regards to this I think it is      reasonable for her to have a 2D echocardiogram.  In particular I      would like to see if there are signs of increasing pulmonary      hypertension.  Her PA pressures were rather high during      catheterization in 2006 in the 60 range.  If there is any evidence      of cor pulmonale, I would not hesitate to refer her for repeat      heart catheterization as her pulmonary status may benefit from      trials of pulmonary vasodilators.  Outside of this, however, I do      not think that she needs any invasive evaluation for her atypical      chest pain.  6. Further recommendation will be based on the results of her      echocardiogram.     Collier Salina C. Johnsie Cancel, MD, [facility]  Electronically Signed  PCN/MedQ  DD: 02/21/2007  DT: 02/22/2007  Job #: [id number]   cc:   Burnett Harry. Joya Gaskins, MD, [facility]  Weston Settle, M.D.

## 2010-12-07 NOTE — Assessment & Plan Note (Signed)
[facility]                             PULMONARY OFFICE NOTE   NAME:[name], [name]                    MRN:          [id number]  DATE:01/29/2007                            DOB:          01-Jul-1951    I saw [name] today in followup for her obstructive sleep apnea.   I had last seen her on August 17, 2005, after she had undergone a CPAP  titration study and she was titrated to a CPAP setting of 7, with  reduction in apnea hypopnea index to 1.1. At this pressure setting, she  was observed in both REM sleep and supine sleep.   She says that she was doing well with this and her main problem with use  of her CPAP machine was that her home care company did not provide her  with a long enough tubing and with the arrangement of her bed furniture,  her machine would fall off of the bed or her mask would become  disconnected any time she tried to move. As a result, she stopped using  the machine approximately 6 months ago. She continued to use  supplemental oxygen at 2 liters at night, but still is having problems  with snoring and difficulty sleeping at night as well as excessive  daytime sleepiness during the day. In addition, she says she has gained  approximately 30 pounds. She said however, otherwise she is not having  any problems with using the mask or the machine itself, although she was  apparently not provided with a humidifier. She was using [employer] previously, but has since been switched to [employer] for  her oxygen supplies.   CURRENT MEDICATIONS:  1. Diovan 160 mg daily.  2. Lipitor once daily.  3. Oxygen 2 liters at night.  4. Protonix 40 mg daily.  5. Albuterol nebulizer every 4-6 hours as needed.   PHYSICAL EXAMINATION:  She is 272 pounds, blood pressure is 132/76,  heart rate is 74, temperature 99.3, oxygen saturation is 95% on room  air.  HEENT: There is no sinus tenderness. There is no nasal discharge. She  has a Mallampati 4 airway. There is no lymphadenopathy.  HEART: Was S1, S2.  CHEST: With fine rales throughout the bases, but no wheezing.  ABDOMEN: Obese, soft and nontender.  EXTREMITIES: There was 1+ leg edema.   IMPRESSION:  Severe obstructive sleep apnea. She had been on continuous  positive airway pressure (CPAP) therapy and apparently the only problem  she had was just not having adequate length for her tubing. The other  concern that I have right now that with her gaining weight her sleep may  be more severe. What I will do is re-establish her on continuous  positive airway pressure (CPAP) therapy with appropriate equipment as  well as with a heated humidifier. She appears to like the full face mask  and I will continue her on this for now. I will also have her undergo an  auto CPAP titration study for two weeks and then certainly if she would  need to have any further adjustments  in her CPAP setting. I had again  emphasized to her the importance of diet, exercise and weight reduction  as well as avoidance of alcohol and sedatives. Driving precautions were  re-emphasized to her as well.   I will followup with her in approximately 6-8 weeks.     Chesley Mires, MD  Electronically Signed    VS/MedQ  DD: 01/29/2007  DT: 01/30/2007  Job #: [id number]   cc:   Burnett Harry. Joya Gaskins, MD, [facility]  Nat Christen, M.D.

## 2010-12-07 NOTE — Assessment & Plan Note (Signed)
[facility]                             PULMONARY OFFICE NOTE   [name], [name]                    MRN:          [id number]  DATE:12/27/2006                            DOB:          [birth year]    Ms. Shipes is a 60 year old African American female with a history of  obstructive sleep apnea, chronic obstructive lung disease, asthmatic  bronchitis.   She is only on oxygen 2 liters at h.s. with CPAP at 7 cm h.s., Protonix  40 mg daily, Spiriva daily, Diovan 160 mg daily, Lipitor daily.   She is complaining of increased shortness of breath with exertion.   PHYSICAL EXAMINATION:  Temperature 98.0, blood pressure 120/86, pulse  70, saturation was 88% on room air.  CHEST: Showed distant breath sounds with prolonged expiratory phase. No  wheeze or rhonchi.  CARDIAC: Showed a regular rate and rhythm without S3. Normal S1, S2.  ABDOMEN: Protuberant.  EXTREMITIES: Showed no edema or clubbing.  SKIN: Was clear.   Spirometry showed significant restrictive defect with FEF 25/75, also  low at 47% predicted.   IMPRESSION:  Is that of combined restrictive defect due to obesity and  obstructive defect due to asthmatic bronchitis with chronic emphysema  and hypoxemia, severe sleep apnea.   PLAN:  Is to maintain continuous positive airway pressure (CPAP) as is  and increase oxygen to 2 liters 24 hours a day and begin Advair 250/50  one spray b.i.d. and discontinue Spiriva and see the patient back in  followup in six weeks.     Burnett Harry Joya Gaskins, MD, [facility]  Electronically Signed    PEW/MedQ  DD: 12/27/2006  DT: 12/27/2006  Job #: [id number]   cc:   Nat Christen, M.D.

## 2010-12-07 NOTE — Assessment & Plan Note (Signed)
[facility]                             PULMONARY OFFICE NOTE   NAME:[name], [name]                    MRN:          [id number]  DATE:01/04/2007                            DOB:          1951-04-09    HISTORY:  A 60 year old black female who is followed by Dr. Joya [name] with  a diagnosis of COPD with an asthmatic component.  She quit smoking in  September of 2006 and comes in today with PFTs done as recently as June  4 which indicate truncation of the expiratory loop with suspected GERD  as well per Dr. Bettina [name] notes.  However, she is not taking Protonix  consistently and when she remembers to take it, it is clearly well after  she has eaten.  She has increasing symptoms of sore throat and  congestion with a hacking cough that is minimally productive.  This  worsened after she started Advair, better since she stopped the Advair  and now is back on Spiriva.   She also reports daytime hypersomnolence.  She uses oxygen at bedtime  but feels headache and sluggishness when she wakes up and actually has  excessive daytime hypersomnolence.  She is supposed to be on CPAP but  can not tolerate any usage, oxygen 2 liters at bedtime instead.  She  denies any pleuritic pain, purulent sputum, chest pain, orthopnea, PND,  or leg swelling.   PHYSICAL EXAMINATION:  She is an obese, ambulatory black female who had  a difficult time answering any questions I asked her in a  straightforward fashion.  She is afebrile, normal vital signs, with a  weight of 269 pounds which is up about 20 pounds over the last year.  HEENT:  Remarkable for mild turbinate edema, oropharynx clear.  NECK:  Supple without cervical adenopathy, tenderness, trachea is  midline, no thyromegaly.  LUNGS:  Fields are completely clear bilaterally to auscultation and  percussion although inspiratory and expiratory time are quite short.  Regular rhythm without murmur, gallop, rub.  ABDOMEN:  Soft,  benign.  EXTREMITIES:  Warm without calf tenderness, cyanosis, clubbing, edema.   IMPRESSION:  1. I am not convinced that she actually has a reversible airflow      pattern based on her history, and also how vague she was about how      she takes her medicines.  I would rather her just use albuterol      p.r.n. and then tell us if she notices a difference before and      after and at this point stop all inhalers based on the most recent      pulmonary function test that was obtained on June 4 which shows a      normal flow volume pattern in the effort independent portion of the      curve and truncation in the early portion of the curve c/w and      upper airway source.  Miminizing her inhalers may improve her upper      airway dysfunction as well.  2. Her greatest concern presently is not dyspnea on exertion or cough,  but daytime hypersomnolence and is related to using oxygen at      bedtime without continuous positive airway pressure.  That was not      Dr. Bettina [name] intention and since she can not tolerate the      continuous positive airway pressure setting as they are I am going      to make an appointment for her to see Dr. Halford [name] as soon as possible      to try to troubleshoot what we can do to help this patient be more      compliant with her continuous positive airway pressure.   Of critical importance of course is weight loss which this patient has  not yet addressed having gained another 20 pounds over the last year.  I  will defer this issue, however, to her primary physician Dr. Truett [name].     [name]. [name] Novas, MD, [facility]  Electronically Signed    MBW/MedQ  DD: 01/04/2007  DT: 01/04/2007  Job #: [id number]   cc:   [name], M.D.

## 2010-12-07 NOTE — Procedures (Signed)
NAME:  [name], [name]             ACCOUNT NO.:  [id number]   MEDICAL RECORD NO.:  [id number]          PATIENT TYPE:  OUT   LOCATION:  SLEEP CENTER                 FACILITY:  [facility]   PHYSICIAN:  Chesley Mires, MD        DATE OF BIRTH:  03/05/1951   DATE OF STUDY:  06/09/2008                            NOCTURNAL POLYSOMNOGRAM   REFERRING PHYSICIAN:  Chesley Mires, MD   INDICATION FOR STUDY:  [name] is a 60 year old female who has a  previous history of obstructive sleep apnea.  She was not able to  tolerate CPAP therapy.  She is referred to the Sleep Lab for a repeat  CPAP titration study.   Height is 5 feet 5 inches, weight is 270 pounds, and BMI is 45.   EPWORTH SLEEPINESS SCORE:  16.   MEDICATIONS:  Ambien, Nexium, and albuterol.  The patient took an Ambien  on the night of study.   SLEEP ARCHITECTURE:  Total recording time was 510 minutes, total sleep  time was 380 minutes, sleep efficiency was 74%, sleep latency was 19  minutes, and REM latency was 69 minutes.  The patient was observed in  all stages of sleep and the patient slept in both the supine and non-  supine position.   RESPIRATORY DATA:  The average respiratory rate was 15.  The patient was  titrated from a CPAP pressure setting of 5 to 12 cm of water.  At a CPAP  pressure setting of 12 cm of water, the apnea-hypopnea index was reduced  to 1.1.  At this pressure setting, the patient was observed in REM sleep  and supine sleep.   OXYGEN DATA:  The baseline oxygenation was 93%.  The oxygen saturation  nadir was 80%.  The patient appeared to have episodes of oxygen  desaturation in the absence of respiratory events and as a result, she  was started on supplemental oxygen at 1 liter in addition to the CPAP at  12 cm of water.  With this combination, she appeared to have  stabilization of her oxygenation.   CARDIAC DATA:  The average heart rate was 69 and the rhythm strip showed  normal sinus rhythm with occasional  PVCs.   MOVEMENT-PARASOMNIA:  The patient had zero rest room trips.  The  periodic limb movement index was zero.   IMPRESSIONS-RECOMMENDATIONS:  The patient was titrated to a continuous  positive airway pressure setting of 12 cm of water with good control of  her obstructive sleep apnea.  At this pressure, she was observed in REM  sleep and supine sleep.   The patient had persistent episodes of oxygen desaturation in the  absence of respiratory events, particularly during REM sleep, which is  consistent with hypoventilation.  This was alleviated with combination  of CPAP therapy and supplemental oxygen at 1 liter.   In addition to diet, exercise, and weight reduction, the patient should  be started on CPAP at 12 cm of water with the use of 1 liter of  supplemental oxygen and then monitored for her clinical response.      Chesley Mires, MD  Diplomat, American Board of Sleep Medicine  Electronically Signed     VS/MEDQ  D:  06/16/2008 16:43:25  T:  06/17/2008 06:50:28  Job:  [id number]

## 2010-12-07 NOTE — Assessment & Plan Note (Signed)
[facility]                             PULMONARY OFFICE NOTE   NAME:[name], [name]                    MRN:          [id number]  DATE:12/27/2006                            DOB:          10-23-1950    CANCELLED     [place] Joya Gaskins, MD, [facility]  Electronically Signed    PEW/MedQ  DD: 12/28/2006  DT: 12/28/2006  Job #: [id number]

## 2010-12-07 NOTE — Procedures (Signed)
NAME:  [name], [name]             ACCOUNT NO.:  [id number]   MEDICAL RECORD NO.:  [id number]          PATIENT TYPE:  OUT   LOCATION:  RAD                           FACILITY:  [facility]   PHYSICIAN:  Cristopher Estimable. Lattie Haw, MD, FACCDATE OF BIRTH:  31-Jul-1950   DATE OF PROCEDURE:  DATE OF DISCHARGE:                                ECHOCARDIOGRAM   CLINICAL DATA:  This is a 60 year old woman with chest pain,  hypertension and dyspnea.  M-mode aorta 2.5, left atrium 4.3, septum  1.1, posterior wall 1.2, LV diastole 4.3, LV systole 3.2.   IMPRESSION:  1. Technically suboptimal, but adequate echocardiographic study.  2. Left atrial size at the upper limit of normal to mildly increased;      normal right atrium.  Right ventricle not ideally imaged, but is      grossly normal.  3. Normal pulmonic valve and proximal pulmonary artery.  4. Normal proximal ascending aorta; aortic valve is grossly normal.  5. Normal mitral valve.  6. Normal left ventricular size; not all myocardial segments      adequately imaged - no areas of akinesis nor dyskinesis.  Overall      LV systolic function appears normal.      Cristopher Estimable. Lattie Haw, MD, [facility]  Electronically Signed     RMR/MEDQ  D:  02/22/2007  T:  02/23/2007  Job:  [id number]

## 2010-12-10 NOTE — Consult Note (Signed)
NAME:  [name], [name]             ACCOUNT NO.:  [id number]   MEDICAL RECORD NO.:  [id number]          PATIENT TYPE:  INP   LOCATION:  A222                          FACILITY:  [facility]   PHYSICIAN:  Scarlett Presto, M.D.   DATE OF BIRTH:  1951-07-05   DATE OF CONSULTATION:  DATE OF DISCHARGE:                                   CONSULTATION   PRIMARY CARE PHYSICIAN:  Shann Medal. Kizzie Furnish, M.D.   CARDIOLOGIST:  Scarlett Presto, M.D.   CHIEF COMPLAINT:  COPD exacerbation with EKG changes.   HISTORY OF PRESENT ILLNESS:  This is a 60 year old African-American female  with a history of anxiety, hypertension, and increased shortness of breath  over the last 5 days.  She went to see her primary care physician this  morning and was sent here due to decreased oxygen saturation.  The patient's  oxygen saturation when she reached the ED were 89%.  The patient states that  she had been having some shortness of breath and left lower abdominal pain  for approximately 5 days and has complained of positive yellow and white  sputum production.   PAST MEDICAL HISTORY:  1.  COPD.  2.  Depression.  3.  Hypertension.  4.  Hypercholesterolemia.   The patient was prescribed to take a statin but had stopped it due to  muscle spasm.  The patient had an echocardiogram today which is pending.   MEDICATIONS AT HOME:  1.  Prozac 20 mg daily.  2.  Diovan, unknown dose.  3.  A statin which patient stopped taking some time ago.  4.  ASA 81 mg daily.   INPATIENT MEDICATIONS:  1.  Levaquin 750 mg IV daily.  2.  Solu-Medrol 60 mg IV q.8 h.  3.  Albuterol nebulizer 2.5 mg q.4 h. p.r.n.  4.  Atrovent nebulizer 0.5 mg q.4 h.  5.  Lovenox 40 mg subcutaneously daily.  6.  ASA 81 mg daily.  7.  Prozac 20 mg p.o. daily.  8.  Morphine 1 to 2 mg IV p.r.n. pain.   ALLERGIES:  No known drug allergies.   SOCIAL HISTORY:  The patient lives in [place] with her husband and is  disabled due to nerves. The patient states she  quit tobacco abuse but upon  questioning says that was today but was a previous heavy smoker.  The  patient denies any alcohol use and states that she has no specific diet.  The patient states that her exercise includes walking.  The patient denies  any recreational drugs or herbal medication use.   FAMILY HISTORY:  Includes heart trouble for her mother, also gallbladder  disorder.  Father deceased with several pneumonia.  Sibling: One brother  with diabetes mellitus.   REVIEW OF SYSTEMS:  Significant for chills, rash on her face, unknown cause.  Positive shortness of breath, PND, occasional palpitations, coughing, no  wheezing.  The patient also is postmenopausal.  The patient states she has  been having some weakness in her right leg and some depression.  MUSCULOSKELETAL:  Positive for arthritic pain which she states is in most of  her joints.  GI:  Positive for GERD symptoms and abdominal pain which she  states were for approximately 3 days.  The patient also states she has heat  and cold intolerance.  All other systems are negative except as noted.   PHYSICAL EXAMINATION:  VITAL SIGNS:  The patient is currently afebrile with  a pulse of 90, respiratory rate 22, saturation 87% on room air.  GENERAL:  Shortness of breath with breathing treatment upon exam.  HEENT:  Pupils equal, round, and reactive to light.  EOMI.  Sclerae clear.  NECK:  Supple without lymphadenopathy, JVD, bruit.  CARDIOVASCULAR:  S1, S2 without murmurs, rubs, or gallops.  Pulses 2+ and  equal without bruits.  LUNGS:  Bilaterally decreased breath sounds with some wheezing.  SKIN:  No rashes or lesions.  ABDOMEN:  Obese, nontender, soft, positive bowel sounds.  EXTREMITIES:  No clubbing or cyanosis.  Positive for 1+ edema bilaterally.  MUSCULOSKELETAL: No joint deformity, effusion.  No spine or CVA tenderness.  NEUROLOGIC:  Alert and oriented x3.  Cranial nerves II-XII grossly intact.  Strength 5/5 all  extremities.   Chest x-ray shows no active cardiopulmonary disease.   EKG: Normal sinus rhythm with a rate of 79 with nonspecific ST changes.   Labs show a white blood cell count of 8.9, hemoglobin 15, hematocrit 45,  platelet count 318.  Sodium 140, potassium 4.3, chloride 100, CO2 35, BUN 5,  creatinine 0.8, glucose 88, total bilirubin 0.6, AST 15, ALT 12, alkaline  phosphatase 74.  CK-MB and troponin point-of-care both negative.  D-dimer is  0.26.  BNP less than 30.  PT 13.7, INR 1.   ASSESSMENT:  1.  Chest pain, atypical.  The patient was status post chronic obstructive      pulmonary disease exacerbation, question related to the chronic      obstructive pulmonary disease exacerbation or related to heart.      Echocardiogram was pending.  Cardiac enzymes point-of-care negative.      Will cycle cardiac enzymes to rule out myocardial infarction.  Currently      on 81 mg aspirin and Lovenox.  If cardiac enzymes are negative, we plan      for dobutamine stress test in the a.m. which will be a 2-day stress test      due to patient's habitus.  2.  Tobacco abuse.  The patient needs tobacco abuse counseling.  3.  Chronic obstructive pulmonary disease exacerbation.  4.  Hyperlipidemia.  We will start patient back on Lipitor 20 mg nightly.  5.  Hypertension.  The patient remains on Diovan.     ______________________________  April Humphrey, NP      Scarlett Presto, M.D.  Electronically Signed    AH/MEDQ  D:  04/04/2005  T:  04/04/2005  Job:  [id number]   cc:   Shann Medal. Kizzie Furnish, MD  Fax: [phone]   Scarlett Presto, M.D.  Fax: [phone]

## 2010-12-10 NOTE — Cardiovascular Report (Signed)
NAME:  [name], [name]             ACCOUNT NO.:  [id number]   MEDICAL RECORD NO.:  [id number]          PATIENT TYPE:  INP   LOCATION:  2041                         FACILITY:  [facility]   PHYSICIAN:  Eustace Quail, M.D. [facility] DATE OF BIRTH:  Jan 11, 1951   DATE OF PROCEDURE:  04/07/2005  DATE OF DISCHARGE:                              CARDIAC CATHETERIZATION   CLINICAL HISTORY:  Ms. Semar is 60 years old and has known severe chronic  obstructive pulmonary disease.  She was admitted with chest pain as well as  shortness of breath.  She underwent a Cardiolite scan which suggested two  vessel ischemia.  Dr. Wilhemina Cash saw her in consultation and recommended  evaluation with coronary angiography.   PROCEDURE:  Left heart catheterization was performed percutaneously via the  right femoral artery using an arterial sheath and 6 French preformed  coronary catheters.  Right heart catheterization was performed  percutaneously via the right femoral vein using a venous sheath and Swan-  Ganz thermodilution catheter.  A distal aortogram was performed to rule out  renovascular causes for hypertension.  The right femoral artery was closed  with Angio-Seal at the end of the procedure.  The patient tolerated the  procedure well and left the laboratory in satisfactory condition.   RESULTS:  The right atrial pressure was 20 mean.  The pulmonary artery pressure was  62/27 with a mean of 44.  The pulmonary wedge pressure was 21 mean.  The  left ventricular pressure was 179/31.  The aortic pressure was 179/94 with a  mean of 127.  The cardiac output/cardiac index by FICK was 4/2  liters/minute/squared.   The left main coronary artery was free of significant disease.   The left anterior descending artery gave rise to two diagonal branches and a  septal perforator.  These and the LAD proper were free of significant  disease.   The circumflex artery gave rise to a ramus branch, a marginal branch, and  three  posterolateral branches.  These vessels were free of significant  disease.   The right coronary artery is a moderate size vessel that gave rise to a  right ventricular branch and a posterior descending branch.  These vessels  were free of significant disease.   The left ventriculogram performed in the RAO projection showed good wall  motion with no areas of hypokinesis.  The estimated ejection fraction was  60%.   A distal aortogram was performed which showed patent renal arteries and no  significant aortoiliac obstruction.   CONCLUSION:  1.  Normal coronary angiography and left ventricular wall motion.  2.  Moderate elevation of pulmonary artery pressure.   RECOMMENDATIONS:  In view of these findings, I think the recent chest pain  is probably noncardiac and the recent Cardiolite Myoview scan is probably  false positive reading.  Will plan reassurance.  I will plan continued  treatment of her pulmonary disease.  I will arrange follow up with Dr.  Wilhemina Cash in the next couple of weeks and then with Dr. Kizzie Furnish after that and Dr.  Wilhemina Cash and Dr. Kizzie Furnish can decide if further pulmonary consultation is  needed.           ______________________________  Eustace Quail, M.D. [facility]     BB/MEDQ  D:  04/07/2005  T:  04/07/2005  Job:  [id number]   cc:   Shann Medal. Kizzie Furnish, MD  Fax: [phone]   Scarlett Presto, M.D.  Fax: [phone]

## 2010-12-10 NOTE — Group Therapy Note (Signed)
NAME:  [name], [name]             ACCOUNT NO.:  [id number]   MEDICAL RECORD NO.:  [id number]          PATIENT TYPE:  INP   LOCATION:  A222                          FACILITY:  [facility]   PHYSICIAN:  Edward L. Luan Pulling, M.D.DATE OF BIRTH:  Jul 31, 1950   DATE OF PROCEDURE:  04/05/2005  DATE OF DISCHARGE:                                   PROGRESS NOTE   SUBJECTIVE:  [name] says she is feeling better.  She confirms that she  was not on any medications for breathing at home and I suspect that is  because at that point she really had not discussed this at length with her  doctor and the diagnosis has not been made.  She does say that she is  feeling better today and has no new complaints.   PHYSICAL EXAMINATION:  VITAL SIGNS:  Her physical examination shows that her  temperature is 97.1, pulse 78, respirations 20, blood pressure 144/89, O2  saturations 91% on 2 L.  CHEST:  Her chest is clearer.  HEART:  Regular.   On 28% O2, her pH was 7.32, pCO2 of 56, pO2 of 75.  She is better  oxygenated.  Her white count is 13,100.  Electrolytes are about the same.   ASSESSMENT:  She is improving.   PLAN:  I would continue with nebulizer treatments and her other treatments  at this point and I would go ahead and schedule her for a pulmonary function  test.  As mentioned previous, I would probably have that done as an  outpatient when she is more ambulatory and up and moving, but we do need to  confirm where she stands as far as her lung disease is concerned.      Edward L. Luan Pulling, M.D.  Electronically Signed     ELH/MEDQ  D:  04/05/2005  T:  04/05/2005  Job:  [id number]

## 2010-12-10 NOTE — Procedures (Signed)
NAME:  [name], [name]             ACCOUNT NO.:  [id number]   MEDICAL RECORD NO.:  [id number]          PATIENT TYPE:  OUT   LOCATION:  SLEEP CENTER                  FACILITY:  [facility]   PHYSICIAN:  Chesley Mires, M.D.      DATE OF BIRTH:  1950/11/23   DATE OF STUDY:  08/08/2005                              NOCTURNAL POLYSOMNOGRAM   REFERRING PHYSICIAN:  Dr. Chesley Mires.   INDICATION FOR STUDY:  The patient returns to the sleep lab for CPAP  titration.   EPWORTH SLEEPINESS SCORE:  16   MEDICATIONS:  Prozac and Spiriva.   SLEEP ARCHITECTURE:  Total recording time was 388.5 minutes.  Total sleep  time as 227.5 minutes.  Sleep efficiency was 59%, which is reduced.  The  patient was observed in all stages of sleep, although there was reduction in  the amount of slow wave sleep and REM sleep.  Sleep latency was 83.5  minutes, which was prolonged.  REM latency was 148 minutes after sleep  onset, which was within normal limits.  The patient was observed in both the  supine and non-supine positions.   RESPIRATORY DATA:  The patient was titrated from a CPAP respiratory setting  of 5 to 7 cmH20.  At 7 cmH20, the apnea/hypopnea index was reduced to 1.1  and the patient was observed in both the supine position and REM at this  pressure setting and oxygenation stabilized.  The patient had breakthrough  snoring towards the end of the study, although this appeared to be due to  mask leak.   CARDIAC DATA:  EKG shows a normal sinus rhythm.   MOVEMENT-PARASOMNIA:  The periodic limb movement index was 0.   IMPRESSIONS-RECOMMENDATIONS:  This was a continuous positive airway pressure  titration study at a continuous positive airway pressure setting of 7 cmH20.  The apnea/hypopnea index was reduced to 1.1.  Snoring was eliminated and  oxygenation stabilized, and the patient was observed in both rapid eye  movement sleep and the supine position at this pressure setting.  Therefore,  the patient should  be started on a continuous positive airway pressure of 7  and then monitored for clinical response.  Of note also is that the patient  had periods particularly in rapid eye movement sleep where her oxygenation  was below 90%, even without having other respiratory events;  therefore, clinical correlation would be required for this as well to  determine any underlying cause for persistent hypoxemia.      Chesley Mires, M.D.  Diplomat, [employer] of Sleep Medicine  Electronically Signed     VS/MEDQ  D:  08/10/2005 09:55:41  T:  08/10/2005 10:39:03  Job:  [id number]

## 2010-12-10 NOTE — H&P (Signed)
NAME:  [name], [name]             ACCOUNT NO.:  [id number]   MEDICAL RECORD NO.:  [id number]          PATIENT TYPE:  INP   LOCATION:  A222                          FACILITY:  [facility]   PHYSICIAN:  Audria Nine, M.D.DATE OF BIRTH:  1951-07-12   DATE OF ADMISSION:  04/04/2005  DATE OF DISCHARGE:  LH                                HISTORY & PHYSICAL   PRIMARY CARE PHYSICIAN:  Mitzi Hansen B. Kizzie Furnish, MD   ADMISSION DIAGNOSES:  1.  Chronic obstructive pulmonary disease exacerbation.  2.  Hypercarbic respiratory failure.  3.  Hypoxic respiratory failure.  4.  Poorly controlled hypertension.   CHIEF COMPLAINT:  Shortness of breath and cough of 5 days duration.   HISTORY OF PRESENT ILLNESS:  [name] is a 60 year old African-American  female who was referred by her primary care physician, Dr. Kizzie Furnish, to the  emergency room.  He was concerned about her shortness of breath and hypoxia  in the office with saturations in the mid 80s on pulse oximetry.  The  patient gives a 5-day history of shortness of breath that is worse with  exertion.  She is also having cough productive of yellowish to whitish  sputum.  She denies any hemoptysis.  She had no pleuritic chest pain.  The  patient reports no fevers but says she may have had some chills and rigors.  Over time, she seems to have been coughing more, particularly in the  morning, productive of sputum.  She also reports wheezing, worse recently  but has been happening over the past couple of years.  The patient has never  been given a formal diagnosis of emphysema or COPD as far as she is aware.  She has never had a pulmonary function test done.  She has not had any  nausea or vomiting or abdominal pain but does report some left flank pain  which is vaguely described.  She denies any frequency, urgency, or dysuria.  Her weight has been fairly stable.   Evaluation in the emergency room here revealed that she was in significant  shortness of breath  with hypoxia.  Blood gas  showed elevated PCO2. The  patient received albuterol and Atrovent nebulizers, Solu-Medrol, and a dose  of Levaquin with improvement in her symptoms.  The patient currently feels  slightly better than when she first came in.  She is being admitted now for  further evaluation and management.   REVIEW OF SYSTEMS:  A 10-point Review of Systems is otherwise negative  except as mentioned in History of Present Illness above.   PAST MEDICAL HISTORY:  1.  Anxiety.  2.  Depression.  3.  Hypertension.   PAST SURGICAL HISTORY:  None.   CURRENT MEDICATIONS:  1.  Prozac 20 mg p.o. daily.  2.  Diovan 160 mg daily.   ALLERGIES:  No known drug allergies.   FAMILY HISTORY:  Positive for diabetes in mother who died from  complications.  Father died from heart failure. She has numerous uncles and  aunts with diabetes.  She had another sibling who passed away in his 1s;  the cause of  death is unclear.   SOCIAL HISTORY:  She is married, has 2 children, lives with her husband.  She has been smoking since age 80 and smokes 5 to 6 cigarettes a day.  The  patient has approximately a 20 to 30-pack-year history of smoking.  She  denies alcohol and no illicit drug use.   She is on disability and does not work.  She is on disability because of  severe depression and anxiety symptoms.   PHYSICAL EXAMINATION:  GENERAL:  She is alert, in mild respiratory distress.  VITAL SIGNS:  Blood pressure 145/87, temperature 98 degrees F, pulse 80,  respiratory rate 22, oxygen saturation 86% on room air.  HEENT:  Normocephalic and atraumatic.  Oral mucosa was dry.  No exudates  were noted.  NECK:  Supple.  No JVD or lymphadenopathy.  LUNGS:  Bilateral expiratory wheezes with diffuse rhonchi.  HEART:  S1, S2 regular.  No S3, S4, gallops, or rubs.  ABDOMEN:  Soft, nontender.  Bowel sounds were positive.  No masses palpable.  EXTREMITIES:  Bilateral trace pitting edema.  No calf induration  or  tenderness was noted.  CNS:  Exam grossly intact with no focal deficits.   LABORATORY DATA:  Blood gas obtained on room air showed a pH of 7.324, PCO2  of 64.3, PO2 of 56, bicarbonate of 82.5.  This suggests a respiratory  acidosis which seems to be chronic with some metabolic alkalosis  compensation.   White blood cell count 8.9, hemoglobin 15, hematocrit 45, platelet count  218.  Eosinophils were 90%, basophils 2%.   D-dimer was negative at 0.26.  PT 13.7, INR 1.0.  Sodium 140, potassium 4.3,  chloride 100, CO2 35, glucose 88, BUN 5, creatinine 0.8.  Liver function  test was normal.  Urinalysis was negative.  Urine microscopy was also  unremarkable.   A 12-lead EKG shows normal sinus rhythm and T wave inversion in II, III, and  aVF and also V3 to V6.   Chest x-ray revealed no acute cardiopulmonary process.   Cardiac enzymes were negative.   ASSESSMENT AND PLAN:  [name] is a 60 year old African-American female  presenting with progressive shortness of breath, cough productive of  yellowish sputum, and wheezing of 5 days duration.  With her history of  smoking, symptoms, physical examination, and her blood gas, it is likely  that this represents chronic obstructive pulmonary disease, especially  chronic bronchitis.   The patient is in acute exacerbation at this time.  Will admit and continue  on Levaquin 750 mg IV daily.  Will also continue on steroid therapy., Medrol  60 mg IV q6h .  Will schedule her nebulizers for q.4 h. and q.2 h. p.r.n.   The patient will need followup evaluation of COPD, and I will request the  pulmonologist, Dr. Luan Pulling, to see her.   She will need a pulmonary function test down the line.   She also has abnormal EKG findings.  I am not sure of what significance they  are, but I will obtain a 2-dimensional echocardiogram on her and ask cardiology to see her.  If she has not had a recent lipid profile, I will  obtain one.   Deep vein  thrombosis prophylaxis will be with Lovenox and gastrointestinal  prophylaxis with Protonix.   I will monitor her closely on 2A and will institute BiPAP if her respiratory  status does not improve.  I will repeat a blood gas on her at 6 p.m.  It is noted that urinalysis is negative.  Her D-dimer is negative, and her B-  natruretic peptide was also negative, essentially ruling out possibility of  congestive heart failure and pulmonary embolism.   I will start her on aspirin 81 mg once a day.   I will resume her Diovan at this time and see if her blood pressure will  improve while in house.  If it does not, we may adjust the dose or introduce  an additional agent.   Code Status:  She is a full code.   I have discussed the above plan with her, and she verbalized full  understanding.  I will also resume her Prozac at this time.      Audria Nine, M.D.  Electronically Signed     AM/MEDQ  D:  04/04/2005  T:  04/04/2005  Job:  [id number]   cc:   Shann Medal. Kizzie Furnish, MD  Fax: [phone]

## 2010-12-10 NOTE — Procedures (Signed)
NAME:  [name], [name]             ACCOUNT NO.:  [id number]   MEDICAL RECORD NO.:  [id number]          PATIENT TYPE:  OUT   LOCATION:  SLEEP LAB                     FACILITY:  [facility]   PHYSICIAN:  Danton Sewer, M.D. [facility] DATE OF BIRTH:  12-21-1950   DATE OF STUDY:  05/15/2005                              NOCTURNAL POLYSOMNOGRAM   REFERRING PHYSICIAN:  Dr. Asencion Noble.   DATE OF STUDY:  May 15, 2005.   INDICATION FOR STUDY:  Hypersomnia with sleep apnea.   EPWORTH SCORE:  20.   SLEEP ARCHITECTURE:  The patient had total sleep time of 263 minutes with  decreased slow wave sleep and REM. Sleep onset latency was very prolonged at  71 minutes, and REM onset was normal. Sleep efficiency was very poor at 67%.   RESPIRATORY DATA:  The patient was found to have 86 hypopneas and 64 apneas  for a respiratory disturbance index of 34 events per hour. These occurred in  all position but were clearly worse during REM. There was very loud snoring  noted.   OXYGEN DATA:  The patient was found to have O2 desaturation as low as 59%  with the obstructive events. She noted that she was on 3 liters of nasal  cannula at home; however, the technician started her out at 1 liter and  increased to 2 liters because of the severe desaturations. These continued  to be an issue because of the patient's obstructive events even on 2 liters  of oxygen.   CARDIAC DATA:  No clinically significant cardiac arrhythmias.   MOVEMENT/PARASOMNIA:  None.   IMPRESSION/RECOMMENDATIONS:  1.  Moderate obstructive sleep apnea with a respiratory disturbance index of      34 events per hour and O2 desaturation as low as 59% during REM. Given      this degree of sleep apnea and the patient's obesity, I would highly      recommend weight loss coupled with C-PAP as the mainstay of treatment.  2.  Significant O2 desaturation associated with the patient's obstructive      events. It is unclear whether she will need  supplemental oxygen once she      is on optimal C-PAP. Clinical correlation is suggested.                                            ______________________________  Danton Sewer, M.D. [facility]  Diplomate, [employer] Board of Sleep  Medicine     KC/MEDQ  D:  05/25/2005 16:26:02  T:  05/25/2005 23:27:41  Job:  [id number]

## 2010-12-10 NOTE — Consult Note (Signed)
NAME:  [name], [name]             ACCOUNT NO.:  [id number]   MEDICAL RECORD NO.:  [id number]          PATIENT TYPE:  INP   LOCATION:  A222                          FACILITY:  [facility]   PHYSICIAN:  Edward L. Luan Pulling, M.D.DATE OF BIRTH:  1950-10-01   DATE OF CONSULTATION:  04/04/2005  DATE OF DISCHARGE:                                   CONSULTATION   MEDICAL CONSULTATION:   DATE OF CONSULTATION:  April 04, 2005   PRIMARY CARE PHYSICIAN:  Patient of the hospitalists.   REASON FOR CONSULTATION:  COPD.   HISTORY:  [name] is a 60 year old who has a history of COPD  longstanding.  She was in her usual state of fairly poor health at home,  went to see her regular physician Dr. Kizzie Furnish who evaluated her and felt that  she needed to be admitted and sent her to the hospital.  She was noted to  have hypoxia-hypercarbia and shortness of breath.  She said she had been  sick for about a week.  She has had some cough bringing up some discolored  sputum.  When she saw Dr. Kizzie Furnish, her O2 was in the mid 80s on a pulse  oximeter.  She has not had any fever but there is some question of chills.  She has not had any chest pain.  She has been wheezing and as mentioned been  short of breath.   PAST MEDICAL HISTORY:  Her past medical history is positive for  anxiety/depression, hypertension and COPD, although she says she is not sure  that anybody has ever told her that she had that.   MEDICATIONS ON ADMISSION:  1.  Prozac 20 mg daily.  2.  Diovan 160 mg daily.   ALLERGIES:  She has no known drug allergies.   FAMILY HISTORY:  Her mother died in her 0000000 of complications of diabetes;  her father died from heart failure; there are multiple other family members  who have had diabetes.   SOCIAL HISTORY:  She has about a 30 to 40 pack-year smoking history.  She  does not use any alcohol.  She is on disability because of her depression  and has not had any industrial exposures as far as she  knows.   PHYSICAL EXAMINATION:  GENERAL:  She is awake and alert, does not appear to  be in any distress now.  VITAL SIGNS:  Her temperature is 97.3, pulse is 90, respirations 22, blood  pressure 137/80, O2 saturation is 90% on 2 L.  HEENT:  Her pupils are reactive, her nose and throat are clear, her mucous  membranes are slightly dry.  CHEST:  Her chest is actually fairly clear and she does not look to be  laboring now.  Her heart is regular.  I do not hear a gallop.  ABDOMEN:  Her abdomen is soft without masses.  EXTREMITIES:  Extremities showed no edema.  CNS:  Central nervous system examination grossly intact.   LABORATORY WORK:  pH of 7.324, pCO2 of 64, pO2 of 56 on room air.  Her white  count is 8900, hemoglobin  15, neutrophils 44%, lymphocytes 40%, eosinophils  9%.  CO2 is 35 suggesting chronic CO2 retention.  Other electrolytes are  normal.  Chest x-ray read as no acute cardiopulmonary process.   ASSESSMENT:  She has chronic obstructive pulmonary disease exacerbation  clearly with hypoxic-hypercarbic respiratory failure.  She has improved  greatly since being admitted and overall has done better.  Apparently she  has not had a previous diagnosis of chronic obstructive pulmonary disease so  she may not be on much in the way of medications at home.  As best I can  tell the only medicines are those listed.  Considering that she may have a  remarkable improvement with treatment I would plan to continue treatment.  I  agree she is going to need a pulmonary function test probably as an  outpatient.      Edward L. Luan Pulling, M.D.  Electronically Signed     ELH/MEDQ  D:  04/04/2005  T:  04/04/2005  Job:  [id number]   cc:   Dr. Kizzie Furnish

## 2010-12-10 NOTE — Procedures (Signed)
NAME:  [name], [name]             ACCOUNT NO.:  [id number]   MEDICAL RECORD NO.:  [id number]          PATIENT TYPE:  INP   LOCATION:  A222                          FACILITY:  [facility]   PHYSICIAN:  Scarlett Presto, M.D.   DATE OF BIRTH:  05-12-1951   DATE OF PROCEDURE:  04/05/2005  DATE OF DISCHARGE:                                    STRESS TEST   DOBUTAMINE STRESS TEST:   TEST:  Dobutamine Myoview performed by April Humphrey, NP.   INDICATIONS:  Shortness of breath and weakness and fatigue.   SYMPTOMS:  The patient was extremely fatigued during the test and especially  with exercising.  Arrhythmia:  None noted during the test.  ST depression  during recovery.  Reason for stopping the test was that patient was unable  to tolerate and became hypotensive.   COMMENTS:  We titrated the dobutamine up to 40 mic.  Heart rate target was  not reached.  Atropine 0.5 mg was given.  The patient became hypotensive.  Heart rate reached 130-135.  Heart rate target was 140.  We injected the  tracer at that point.  We did notice ischemia more during the recovery  phase.  Resting heart rate was 76.  Resting systolic blood pressure is  128/88, target heart rate was 140, maximum predicted heart rate was 165,  percentile heart rate was 82, worst case ST slope was -5, worst case ST  level was -1.7, maximum heart rate was [id number], maximum systolic blood pressure  was 178/80, total time 119 minutes and 4 seconds.  Mets achieved was 1.     ______________________________  April Humphrey, NP      Scarlett Presto, M.D.  Electronically Signed    AH/MEDQ  D:  04/05/2005  T:  04/05/2005  Job:  [id number]

## 2010-12-10 NOTE — Discharge Summary (Signed)
NAME:  [name], [name]             ACCOUNT NO.:  [id number]   MEDICAL RECORD NO.:  [id number]          PATIENT TYPE:  INP   LOCATION:  2041                         FACILITY:  [facility]   PHYSICIAN:  Eustace Quail, M.D. [facility] DATE OF BIRTH:  1950/09/03   DATE OF ADMISSION:  04/04/2005  DATE OF DISCHARGE:  04/08/2005                                 DISCHARGE SUMMARY   PRIMARY CARDIOLOGIST:  Scarlett Presto, M.D.   PRIMARY CARE PHYSICIAN:  Shann Medal. Kizzie Furnish, M.D.   PRINCIPAL DIAGNOSIS:  Shortness of breath.   OTHER DIAGNOSES:  1.  Chronic obstructive pulmonary disease.  2.  Depression.  3.  Hypertension.  4.  Hypercholesterolemia.   PROCEDURE:  Left heart cardiac catheterization revealing normal coronary  arteries with EF of 60% and elevated pulmonary artery pressure.   ALLERGIES:  No known drug allergies.   HISTORY OF PRESENT ILLNESS:  A 60 year old African American female with  prior history of COPD, hypertension, hyperlipidemia, as well as anxiety who  saw her primary care physician on April 04, 2005 with complaints of  worsening shortness of breath.  She was noted to have decreased oxygen  saturations as well as yellow-white sputum for five days.  She was sent to  the [facility] Emergency Department, was saturating 89%.  She was admitted  to [facility] where she ruled out for MI and was treated for COPD  exacerbation with Levaquin, Solu-Medrol and nebulizers.  She was felt to  have an abnormal EKG and subsequently underwent a Myoview study which was  suspicious or suggestive of two-vessel ischemia with inferior LAD reversible  changes.  EF was 62%.  She was transferred to [facility]  September  13 for further evaluation and cardiac catheterization.   HOSPITAL COURSE:  Mrs. Funches underwent left heart cardiac catheterization  on September 14 by Dr. Eustace Quail revealing normal coronary arteries with  an EF of 60%.  Right heart pressures measured an RA of 20, PA 62/27  with a  mean of 44, PCWP of 22.  It was felt that she could be medically managed and  follow up with Dr. Wilhemina Cash in two weeks following discharge.  Following her  procedure, she did have some oozing at the groin site which was used for  catheterization and was advised that she maintain bed rest for the remainder  of the night.  On September 15, she was discharged home in satisfactory  condition.   LABORATORY DATA:  Hemoglobin 13.6, hematocrit 41, WBC 12.9, platelets 309.  Sodium 144, potassium 4.2, chloride 102, CO2 33, BUN 17, creatinine 0.9,  glucose 133, total bilirubin 0.7, alk phos 60, AST 14, ALT 12, total protein  7, albumin 3.4, calcium 8.8, hemoglobin A1C 6.  Total cholesterol 260,  triglycerides 91, HDL 60, LDL 138.   DISPOSITION:  The patient is being discharged home in good condition.   FOLLOW UP:  She will follow up with Dr. Scarlett Presto in [place] on  September 20 at 1:30 p.m.  She is asked to follow up with her primary care  physician, Dr. Kizzie Furnish, three to four weeks.  DISCHARGE MEDICATIONS:  1.  Prozac 20 mg daily.  2.  Aspirin 81 mg daily.  3.  Diovan 160 mg daily.  4.  Albuterol two puffs inhaled q.4h. p.r.n.  5.  Atrovent two puffs inhaled q.i.d.   OUTSTANDING LABORATORY STUDIES:  None.   Discharge encounter was 30 minutes.      Rogelia Mire, NP    ______________________________  Eustace Quail, M.D. [facility]    CRB/MEDQ  D:  06/29/2005  T:  06/30/2005  Job:  [id number]

## 2010-12-10 NOTE — Group Therapy Note (Signed)
NAME:  [name], [name]             ACCOUNT NO.:  [id number]   MEDICAL RECORD NO.:  [id number]          PATIENT TYPE:  INP   LOCATION:  A222                          FACILITY:  [facility]   PHYSICIAN:  Edward L. Luan Pulling, M.D.DATE OF BIRTH:  1950/10/23   DATE OF PROCEDURE:  04/06/2005  DATE OF DISCHARGE:                                   PROGRESS NOTE   SUBJECTIVE:  [name] says she is feeling much better and has been told  that she may be able to go home today. She has no other new complaints.   OBJECTIVE:  GENERAL:  Her physical examination today shows that her she is  awake and alert.  VITAL SIGNS:  Her temperature 98.3, pulse 68, respirations 20, blood  pressure 134/67, O2 saturation 95% on 2 liters.  CHEST:  Her chest is much clearer that it has been. She looks pretty  comfortable.   ASSESSMENT:  She seems to be doing better.   PLAN:  I think it is okay letter go home. She will need to have a pulmonary  function test probably done as an outpatient. She will need a nebulizer. She  may need O2 and then a follow-up.      Edward L. Luan Pulling, M.D.  Electronically Signed     ELH/MEDQ  D:  04/06/2005  T:  04/06/2005  Job:  [id number]

## 2010-12-10 NOTE — Procedures (Signed)
NAME:  [name], [name]             ACCOUNT NO.:  [id number]   MEDICAL RECORD NO.:  [id number]          PATIENT TYPE:  INP   LOCATION:  A222                          FACILITY:  [facility]   PHYSICIAN:  Scarlett Presto, M.D.   DATE OF BIRTH:  05/15/1951   DATE OF PROCEDURE:  04/04/2005  DATE OF DISCHARGE:                                  ECHOCARDIOGRAM   HISTORY OF PRESENT ILLNESS:  Mrs. Vandolah is a 60 year old African American  female with a COPD exacerbation and abnormal electrocardiogram.  Technical  quality of this study is difficult.   M-MODE TRACINGS:  Aorta 27 mm.   Left atrium 39 mm.   Septum 12 mm.   Posterior wall 13 mm.   Left ventricular diastolic dimension 44 mm.   Left ventricular systolic dimension 30 mm.   A 2D AND DOPPLER IMAGING:  The left ventricle is normal size with vigorous  LV systolic function, estimated ejection fraction 65-70%.  No obvious wall  motion abnormalities are seen.  There is mild left ventricular hypertrophy  which appears to be concentric.   Right ventricle is normal size with normal systolic function.   Both atria appear to be normal size.  Aortic valve is not well seen, but  does not appear to have significant stenosis or regurgitation.  The mitral  valve is morphologically unremarkable with no stenosis or regurgitation.  The tricuspid valve has trace regurgitation.   There is no obvious pericardial effusion.  The inferior vena cava and  ascending aorta were not well seen.      Scarlett Presto, M.D.  Electronically Signed     JH/MEDQ  D:  04/04/2005  T:  04/05/2005  Job:  [id number]

## 2010-12-22 ENCOUNTER — Emergency Department (HOSPITAL_COMMUNITY)
Admission: EM | Admit: 2010-12-22 | Discharge: 2010-12-22 | Disposition: A | Discharge status: Home / Self Care | Payer: Medicare Other | Attending: Emergency Medicine | Admitting: Emergency Medicine

## 2010-12-22 DIAGNOSIS — K219 Gastro-esophageal reflux disease without esophagitis: Secondary | ICD-10-CM | POA: Insufficient documentation

## 2010-12-22 DIAGNOSIS — J449 Chronic obstructive pulmonary disease, unspecified: Secondary | ICD-10-CM | POA: Insufficient documentation

## 2010-12-22 DIAGNOSIS — J4489 Other specified chronic obstructive pulmonary disease: Secondary | ICD-10-CM | POA: Insufficient documentation

## 2010-12-22 DIAGNOSIS — M7989 Other specified soft tissue disorders: Secondary | ICD-10-CM | POA: Insufficient documentation

## 2010-12-22 DIAGNOSIS — R609 Edema, unspecified: Secondary | ICD-10-CM | POA: Insufficient documentation

## 2010-12-22 DIAGNOSIS — I10 Essential (primary) hypertension: Secondary | ICD-10-CM | POA: Insufficient documentation

## 2010-12-22 LAB — HEPATIC FUNCTION PANEL
Alkaline Phosphatase: 77 U/L (ref 39–117)
Bilirubin, Direct: 0.1 mg/dL (ref 0.0–0.3)
Total Bilirubin: 0.3 mg/dL (ref 0.3–1.2)

## 2010-12-22 LAB — DIFFERENTIAL
Basophils Absolute: 0 10*3/uL (ref 0.0–0.1)
Eosinophils Relative: 0 % (ref 0–5)
Lymphocytes Relative: 12 % (ref 12–46)
Lymphs Abs: 1.4 10*3/uL (ref 0.7–4.0)
Monocytes Absolute: 0.6 10*3/uL (ref 0.1–1.0)
Monocytes Relative: 5 % (ref 3–12)

## 2010-12-22 LAB — CBC
HCT: 37.2 % (ref 36.0–46.0)
Hemoglobin: 12.3 g/dL (ref 12.0–15.0)
MCH: 28.5 pg (ref 26.0–34.0)
MCHC: 33.1 g/dL (ref 30.0–36.0)
MCV: 86.3 fL (ref 78.0–100.0)
RDW: 14 % (ref 11.5–15.5)

## 2010-12-22 LAB — BASIC METABOLIC PANEL
BUN: 14 mg/dL (ref 6–23)
CO2: 29 mEq/L (ref 19–32)
Calcium: 9.3 mg/dL (ref 8.4–10.5)
Creatinine, Ser: 0.82 mg/dL (ref 0.4–1.2)
GFR calc non Af Amer: >60 mL/min (ref >60)
Glucose, Bld: 212 mg/dL — ABNORMAL HIGH (ref 70–99)

## 2010-12-22 LAB — GLUCOSE, CAPILLARY: Glucose-Capillary: 214 mg/dL — ABNORMAL HIGH (ref 70–99)

## 2011-01-14 ENCOUNTER — Telehealth: Payer: Self-pay | Admitting: Critical Care Medicine

## 2011-01-14 MED ORDER — FUROSEMIDE 20 MG PO TABS: 20.0000 mg | ORAL_TABLET | Freq: Every day | ORAL | Status: DC

## 2011-01-14 NOTE — Telephone Encounter (Signed)
Spoke with pt and notified of recs per RA.  Pt verbalized understanding. Rx sent to pharmacy and appt with TP sched for 01/18/11 at 9:15 am.

## 2011-01-14 NOTE — Telephone Encounter (Signed)
Lasix 20 mg daily x 5ds OV next wk with TP or PW

## 2011-01-14 NOTE — Telephone Encounter (Signed)
Pt of PW-c/o bilateral ankle edema x 5 days, warm to touch, increased cough. Swelling goes down at night. Was on Lasix the first of the month x 5 days after leaving hospital. Offered to come in to be seen, pt declined stating she does not have the gas to come in. Advised she was informed her BP has been normal. NKDA. Please advise. Thanks. Will forward to "doc of the day".

## 2011-01-17 ENCOUNTER — Encounter: Payer: Self-pay | Admitting: Adult Health

## 2011-01-18 ENCOUNTER — Ambulatory Visit: Payer: Medicare Other | Admitting: Adult Health

## 2011-01-28 ENCOUNTER — Ambulatory Visit (INDEPENDENT_AMBULATORY_CARE_PROVIDER_SITE_OTHER): Payer: Medicare Other | Admitting: Adult Health

## 2011-01-28 ENCOUNTER — Encounter: Payer: Self-pay | Admitting: Adult Health

## 2011-01-28 DIAGNOSIS — J449 Chronic obstructive pulmonary disease, unspecified: Secondary | ICD-10-CM

## 2011-01-28 DIAGNOSIS — E678 Other specified hyperalimentation: Secondary | ICD-10-CM

## 2011-01-28 DIAGNOSIS — J4489 Other specified chronic obstructive pulmonary disease: Secondary | ICD-10-CM

## 2011-01-28 DIAGNOSIS — R609 Edema, unspecified: Secondary | ICD-10-CM

## 2011-01-28 MED ORDER — FUROSEMIDE 20 MG PO TABS: 20.0000 mg | ORAL_TABLET | Freq: Every day | ORAL | Status: DC

## 2011-01-28 NOTE — Patient Instructions (Addendum)
Restart Lasix 20mg  2 tabs daily for 3 days then 1 daily .  Low salt diet  Keep legs elevated.  Support hose  I will call with labs and xray results  We are setting you up for a ultrasound of your legs follow up Dr. Joya Gaskins  In 3-4 weeks and  As needed   Please contact office for sooner follow up if symptoms do not improve or worsen or seek emergency care   We are sending referral for primary care doctor in [place]

## 2011-01-28 NOTE — Assessment & Plan Note (Addendum)
Lower bilateral extremity edema suspect is related to venous insufficiency Will need to check xray , labs w/ cbc, bnp, bmet  Set up for venous doppler   Plan Restart Lasix 20mg  2 tabs daily for 3 days then 1 daily .  Low salt diet  Keep legs elevated.  Support hose  I will call with labs and xray results  We are setting you up for a ultrasound of your legs follow up Dr. Joya Gaskins  In 3-4 weeks and  As needed   Please contact office for sooner follow up if symptoms do not improve or worsen or seek emergency care

## 2011-01-28 NOTE — Progress Notes (Signed)
I agree with this plan of care and thank you

## 2011-01-28 NOTE — Assessment & Plan Note (Signed)
Appears compensated without flare  Cont on spiriva  follow up Dr. Joya Gaskins  In 4 weeks.

## 2011-01-28 NOTE — Assessment & Plan Note (Signed)
Cont on nocturnal CPAP  Check labs and xray to monitor for vol. Overload.

## 2011-01-28 NOTE — Progress Notes (Signed)
  Subjective:    Patient ID: [name], female    DOB: 1950-10-29, 60 y.o.   MRN: [id number]  HPI 60 -year-old [place] American female with a history of obstructive sleep apnea, chronic obstructive lung disease, asthmatic bronchitis.   01/28/2011 Acute OV  Complains of Both legs x 2  month- only took 5 days of lasix  From ER  . She states that her breathing is the same- no better or worse since last visit in march of this year. Seen in ER 12/22/10 for leg swelling. Given lasix for 5 days which helped but does not have any more lasix. Labs were essentially unremarkable. She does not have a PCP. Says her dyspnea is at baseline. Continues on Spiriva daily.  Has OSA/OHS on nocturnal CPAP. No calf pain, fever or redness.    Review of Systems Constitutional:   No  weight loss, night sweats,  Fevers, chills, fatigue, or  lassitude.  HEENT:   No headaches,  Difficulty swallowing,  Tooth/dental problems, or  Sore throat,                No sneezing, itching, ear ache, nasal congestion, post nasal drip,   CV:  No chest pain,  Orthopnea, PND,   anasarca, dizziness, palpitations, syncope.   GI  No heartburn, indigestion, abdominal pain, nausea, vomiting, diarrhea, change in bowel habits, loss of appetite, bloody stools.   Resp:   No excess mucus, no productive cough,  No non-productive cough,  No coughing up of blood.  No change in color of mucus.  No wheezing.  No chest wall deformity  Skin: no rash or lesions.  GU: no dysuria, change in color of urine, no urgency or frequency.  No flank pain, no hematuria   MS:  No joint pain or swelling.  No decreased range of motion.     Psych:  No change in mood or affect. No depression or anxiety.            Objective:   Physical Exam GEN: A/Ox3; pleasant , NAD, morbidly obese   HEENT:  [place]/AT,  EACs-clear, TMs-wnl, NOSE-clear, THROAT-clear, no lesions, no postnasal drip or exudate noted.   NECK:  Supple w/ fair ROM; no JVD; normal carotid  impulses w/o bruits; no thyromegaly or nodules palpated; no lymphadenopathy.  RESP  Clear  P & A; diminshed in bases w/o, wheezes/ rales/ or rhonchi.no accessory muscle use, no dullness to percussion  CARD:  RRR, no m/r/g  , 2+ peripheral edema, venous insufficiency changes pulses intact, no cyanosis or clubbing. Neg homans sign  GI:   Soft & nt; nml bowel sounds; no organomegaly or masses detected.  Musco: Warm bil, no deformities or joint swelling noted.   Neuro: alert, no focal deficits noted.    Skin: Warm, no lesions or rashes          Assessment & Plan:

## 2011-01-31 ENCOUNTER — Encounter (INDEPENDENT_AMBULATORY_CARE_PROVIDER_SITE_OTHER): Payer: Medicare Other | Admitting: *Deleted

## 2011-01-31 ENCOUNTER — Other Ambulatory Visit: Payer: Self-pay | Admitting: *Deleted

## 2011-01-31 ENCOUNTER — Encounter: Payer: Medicare Other | Admitting: *Deleted

## 2011-01-31 DIAGNOSIS — R609 Edema, unspecified: Secondary | ICD-10-CM

## 2011-02-03 ENCOUNTER — Telehealth: Payer: Self-pay | Admitting: Adult Health

## 2011-02-03 ENCOUNTER — Encounter: Payer: Self-pay | Admitting: Adult Health

## 2011-02-03 ENCOUNTER — Telehealth: Payer: Self-pay | Admitting: Critical Care Medicine

## 2011-02-03 NOTE — Telephone Encounter (Signed)
Spoke with Randall Hiss and he will refax form to triage fax #. Will await form, then forward msg to [place] so she may follow-up.

## 2011-02-03 NOTE — Telephone Encounter (Signed)
Crystal have you seen this? Please advise and if not I can get them to re-fax, thanks!

## 2011-02-03 NOTE — Telephone Encounter (Signed)
7.9.12 doppler results received > negative for DVT.  Also, pt did not go for either her labs or cxr that were ordered at 7.6.12 ov w/ TP.  ATC pt, line rang multiple times with NA and no option to LM.  WCB.

## 2011-02-03 NOTE — Telephone Encounter (Signed)
I don't think I've seen these.  I do not have them out here with me.

## 2011-02-04 NOTE — Telephone Encounter (Signed)
Form given to crystal. [place] Bing, CMA

## 2011-02-04 NOTE — Telephone Encounter (Signed)
Form signed by PW bc VS is out of  Office for several weeks.  However, it looks like pt sees Dr. Halford Chessman for sleep but has not seen him since 11/2009.  Pt will need to schedule f/u with Dr. Halford Chessman.

## 2011-02-04 NOTE — Telephone Encounter (Signed)
Form faxed by Alida.  Called [employer], spoke with Baxter Flattery.  She is aware form was completed by PW but pt sees VS for sleep.  She is aware pt will need f/u with [facility] for additional paperwork to be completed.  She verbalized understanding of this and will inform Randall Hiss.    Called pt's home # - NA and unable to leave message Called pt's cell # - [email or web address] to scheduled sleep f/u with Dr. Halford Chessman.

## 2011-02-07 NOTE — Telephone Encounter (Signed)
ATCx2. [place] Bing, CMA

## 2011-02-08 ENCOUNTER — Telehealth: Payer: Self-pay | Admitting: Critical Care Medicine

## 2011-02-08 NOTE — Telephone Encounter (Signed)
Duplicate message. Jennifer Castillo, CMA  

## 2011-02-08 NOTE — Telephone Encounter (Signed)
Pt appt set to see Dr. Halford Chessman on 03-14-11 at 10am. Pt aware.[place] Bing, CMA

## 2011-02-08 NOTE — Telephone Encounter (Signed)
I advised the pt of doppler results. I also advised she was supposed to get a cxr and labs done the day of her appt. Pt states she wasn't told that she needed them that day. I advised the order is placed and for her to come by and get them at her convenience. [place] Bing, CMA

## 2011-02-10 ENCOUNTER — Encounter: Payer: Medicare Other | Admitting: Cardiology

## 2011-03-01 ENCOUNTER — Ambulatory Visit: Payer: Medicare Other | Admitting: Critical Care Medicine

## 2011-03-14 ENCOUNTER — Ambulatory Visit: Payer: Medicare Other | Admitting: Pulmonary Disease

## 2011-03-15 ENCOUNTER — Ambulatory Visit: Payer: Medicare Other | Admitting: Critical Care Medicine

## 2011-03-21 ENCOUNTER — Encounter: Payer: Self-pay | Admitting: Family Medicine

## 2011-03-21 ENCOUNTER — Ambulatory Visit: Payer: Medicare Other | Admitting: Family Medicine

## 2011-03-31 ENCOUNTER — Ambulatory Visit: Payer: Medicare Other | Admitting: Pulmonary Disease

## 2011-04-01 ENCOUNTER — Ambulatory Visit (INDEPENDENT_AMBULATORY_CARE_PROVIDER_SITE_OTHER): Payer: Medicare Other | Admitting: Family Medicine

## 2011-04-01 ENCOUNTER — Other Ambulatory Visit: Payer: Self-pay | Admitting: Family Medicine

## 2011-04-01 ENCOUNTER — Encounter: Payer: Self-pay | Admitting: Family Medicine

## 2011-04-01 VITALS — BP 126/70 | HR 72 | Resp 16 | Ht 66.0 in | Wt 271.0 lb

## 2011-04-01 DIAGNOSIS — I10 Essential (primary) hypertension: Secondary | ICD-10-CM

## 2011-04-01 DIAGNOSIS — R7302 Impaired glucose tolerance (oral): Secondary | ICD-10-CM | POA: Insufficient documentation

## 2011-04-01 DIAGNOSIS — J449 Chronic obstructive pulmonary disease, unspecified: Secondary | ICD-10-CM

## 2011-04-01 DIAGNOSIS — Z1239 Encounter for other screening for malignant neoplasm of breast: Secondary | ICD-10-CM

## 2011-04-01 DIAGNOSIS — R7309 Other abnormal glucose: Secondary | ICD-10-CM

## 2011-04-01 DIAGNOSIS — E785 Hyperlipidemia, unspecified: Secondary | ICD-10-CM

## 2011-04-01 DIAGNOSIS — L219 Seborrheic dermatitis, unspecified: Secondary | ICD-10-CM | POA: Insufficient documentation

## 2011-04-01 DIAGNOSIS — Z139 Encounter for screening, unspecified: Secondary | ICD-10-CM

## 2011-04-01 MED ORDER — NYSTATIN-TRIAMCINOLONE 100000-0.1 UNIT/GM-% EX CREA: TOPICAL_CREAM | Freq: Two times a day (BID) | CUTANEOUS | Status: DC

## 2011-04-01 NOTE — Patient Instructions (Signed)
For your face -use the cream twice a day for the next 2 weeks  For your breathing if this gets worse call Dr. Joya Gaskins  Continue the other medications Get your flu shot done at the lung doctor! Get your labs done-- do not eat after midnight  Schedule a physical for a PAP Smear  We will set you up for a Mammogram

## 2011-04-01 NOTE — Progress Notes (Signed)
  Subjective:    Patient ID: [name], female    DOB: 09/01/1950, 60 y.o.   MRN: [id number]  HPI Pt here to establish care, medications and history   Pulmonary- Dr. Asencion Noble Sleep Apnea/COPD- Dr. Halford Chessman Previous PCP- [facility], prior Dr. Truett Mainland  Dry skin on face- always has dryness around nose area, this has been a problem for years, was using a cream which helped COPD- wears 1 liter at oxygen at bedtime, was on nebulizers and inhalers OSA- wears CPAP at bedtime   Leg swelling- taking Lasix , gets worse in warmer weather, no history of heart failure or liver failure, no MI  Hyperlipidemia- on pravastatin  HTN- no mediations currently  PAP Smear overdue Mammogram Overdue     Review of Systems    GEN- denies fatigue, fever, weight loss,weakness, recent illness HEENT- + eye drainage- clear, change in vision, nasal discharge, CVS- denies chest pain, palpitations, +leg swelling RESP- denies SOB, +cough- non productive  denies wheeze ABD- denies N/V, change in stools, abd pain GU- denies dysuria, hematuria, dribbling, incontinence MSK- + joint pain, occ muscle aches in back,        Objective:   Physical Exam  GEN- NAD, alert and oriented x3 HEENT- PERRL, EOMI, , MMM, oropharynx clear, endentulous top row  Neck- Supple, no thryomegaly, no carotid bruit CVS- RRR, no murmur RESP-CTAB, decreased BS at bases  EXT- trace pedal  edema Pulses- Radial, DP- 2+ Skin- erythematous rash on chin, cheeks, around nares and forehead, small maculopapular lesions on chin        Assessment & Plan:

## 2011-04-03 NOTE — Assessment & Plan Note (Signed)
Obtain A1C. 

## 2011-04-03 NOTE — Assessment & Plan Note (Addendum)
Continue statin, check FLP

## 2011-04-03 NOTE — Assessment & Plan Note (Signed)
F/u with Pulmonary

## 2011-04-03 NOTE — Assessment & Plan Note (Signed)
Trial of fungal/steroid cream pt was prescribed in the past

## 2011-04-03 NOTE — Assessment & Plan Note (Signed)
Goal < 140/90, will monitor, no meds currently

## 2011-04-04 ENCOUNTER — Ambulatory Visit (HOSPITAL_COMMUNITY): Admission: RE | Admit: 2011-04-04 | Payer: Medicare Other | Source: Ambulatory Visit

## 2011-04-12 ENCOUNTER — Telehealth: Payer: Self-pay | Admitting: *Deleted

## 2011-04-13 NOTE — Telephone Encounter (Signed)
Called patient, no answer 

## 2011-04-13 NOTE — Telephone Encounter (Signed)
Please let her know, the cream she had was on file, it has both a steroid and fungal agent. She should use this

## 2011-04-14 ENCOUNTER — Ambulatory Visit: Payer: Medicare Other | Admitting: Pulmonary Disease

## 2011-04-14 MED ORDER — FLUTICASONE PROPIONATE 0.05 % EX CREA: TOPICAL_CREAM | Freq: Two times a day (BID) | CUTANEOUS | Status: DC

## 2011-04-14 NOTE — Telephone Encounter (Signed)
Patient states she cannot use the nystatin cream on her face. States dermatology recommends the fluticasone cream. Will you write that one?

## 2011-04-14 NOTE — Telephone Encounter (Signed)
Sent flucticasone

## 2011-04-18 ENCOUNTER — Telehealth: Payer: Self-pay | Admitting: Family Medicine

## 2011-04-19 MED ORDER — FLUTICASONE PROPIONATE 0.05 % EX CREA: TOPICAL_CREAM | Freq: Two times a day (BID) | CUTANEOUS | Status: DC

## 2011-04-19 NOTE — Telephone Encounter (Signed)
Tried to call [facility] but was kept on hold for over 5 minutes. Resent the med again electronically

## 2011-05-03 LAB — BASIC METABOLIC PANEL
BUN: 10
Chloride: 104
Creatinine, Ser: 0.94
GFR calc Af Amer: >60
GFR calc non Af Amer: >60
Potassium: 3.9

## 2011-05-03 LAB — APTT: aPTT: 39 — ABNORMAL HIGH

## 2011-05-03 LAB — DIFFERENTIAL
Basophils Absolute: 0.1
Eosinophils Relative: 4
Lymphocytes Relative: 34
Monocytes Absolute: 0.7

## 2011-05-03 LAB — CBC
HCT: 36.5
Hemoglobin: 12.2
MCHC: 33.5
RDW: 15.2

## 2011-05-06 ENCOUNTER — Ambulatory Visit (HOSPITAL_COMMUNITY)
Admission: RE | Admit: 2011-05-06 | Discharge: 2011-05-06 | Disposition: A | Discharge status: Home / Self Care | Payer: Medicare Other | Source: Ambulatory Visit | Attending: Family Medicine | Admitting: Family Medicine

## 2011-05-06 DIAGNOSIS — Z1231 Encounter for screening mammogram for malignant neoplasm of breast: Secondary | ICD-10-CM | POA: Insufficient documentation

## 2011-05-06 DIAGNOSIS — Z139 Encounter for screening, unspecified: Secondary | ICD-10-CM

## 2011-05-07 LAB — LIPID PANEL
Cholesterol: 242 mg/dL — ABNORMAL HIGH (ref 0–200)
Triglycerides: 223 mg/dL — ABNORMAL HIGH (ref <150)
VLDL: 45 mg/dL — ABNORMAL HIGH (ref 0–40)

## 2011-05-07 LAB — HEMOGLOBIN A1C: Mean Plasma Glucose: 126 mg/dL — ABNORMAL HIGH (ref <117)

## 2011-05-07 LAB — TSH: TSH: 2.603 u[IU]/mL (ref 0.350–4.500)

## 2011-05-09 ENCOUNTER — Encounter: Payer: Medicare Other | Admitting: Family Medicine

## 2011-05-09 ENCOUNTER — Encounter: Payer: Self-pay | Admitting: Family Medicine

## 2011-05-12 ENCOUNTER — Ambulatory Visit: Payer: Medicare Other | Admitting: Pulmonary Disease

## 2012-01-12 ENCOUNTER — Encounter (HOSPITAL_COMMUNITY): Payer: Self-pay

## 2012-01-12 ENCOUNTER — Telehealth: Payer: Self-pay | Admitting: Family Medicine

## 2012-01-12 ENCOUNTER — Emergency Department (HOSPITAL_COMMUNITY)
Admission: EM | Admit: 2012-01-12 | Discharge: 2012-01-12 | Disposition: A | Discharge status: Home / Self Care | Payer: Medicare Other | Attending: Emergency Medicine | Admitting: Emergency Medicine

## 2012-01-12 DIAGNOSIS — I10 Essential (primary) hypertension: Secondary | ICD-10-CM | POA: Insufficient documentation

## 2012-01-12 DIAGNOSIS — E785 Hyperlipidemia, unspecified: Secondary | ICD-10-CM | POA: Insufficient documentation

## 2012-01-12 DIAGNOSIS — Z87891 Personal history of nicotine dependence: Secondary | ICD-10-CM | POA: Insufficient documentation

## 2012-01-12 DIAGNOSIS — K219 Gastro-esophageal reflux disease without esophagitis: Secondary | ICD-10-CM | POA: Insufficient documentation

## 2012-01-12 DIAGNOSIS — J449 Chronic obstructive pulmonary disease, unspecified: Secondary | ICD-10-CM | POA: Insufficient documentation

## 2012-01-12 DIAGNOSIS — M1990 Unspecified osteoarthritis, unspecified site: Secondary | ICD-10-CM | POA: Insufficient documentation

## 2012-01-12 DIAGNOSIS — J4489 Other specified chronic obstructive pulmonary disease: Secondary | ICD-10-CM | POA: Insufficient documentation

## 2012-01-12 DIAGNOSIS — G4733 Obstructive sleep apnea (adult) (pediatric): Secondary | ICD-10-CM | POA: Insufficient documentation

## 2012-01-12 DIAGNOSIS — M109 Gout, unspecified: Secondary | ICD-10-CM

## 2012-01-12 MED ORDER — COLCHICINE 0.6 MG PO TABS: 0.6000 mg | ORAL_TABLET | Freq: Every day | ORAL | Status: DC

## 2012-01-12 MED ORDER — COLCHICINE 0.6 MG PO TABS
1.2000 mg | ORAL_TABLET | Freq: Once | ORAL | Status: AC
  Administered 2012-01-12: 1.2 mg via ORAL
  Filled 2012-01-12: qty 2

## 2012-01-12 MED ORDER — INDOMETHACIN 50 MG PO CAPS: 50.0000 mg | ORAL_CAPSULE | Freq: Two times a day (BID) | ORAL | Status: DC

## 2012-01-12 MED ORDER — HYDROCODONE-ACETAMINOPHEN 7.5-325 MG PO TABS: 1.0000 | ORAL_TABLET | Freq: Four times a day (QID) | ORAL | Status: AC | PRN

## 2012-01-12 MED ORDER — MORPHINE SULFATE 4 MG/ML IJ SOLN
INTRAMUSCULAR | Status: AC
  Filled 2012-01-12: qty 2

## 2012-01-12 MED ORDER — MORPHINE SULFATE 2 MG/ML IJ SOLN
8.0000 mg | Freq: Once | INTRAMUSCULAR | Status: AC
  Administered 2012-01-12: 8 mg via INTRAMUSCULAR
  Filled 2012-01-12: qty 1

## 2012-01-12 NOTE — Discharge Instructions (Signed)
Arthritis - Gout Gout is caused by uric acid crystals forming in the joint. The big toe, foot, ankle, and knee are the joints most often affected.Often uric acid levels in the blood are also elevated. Symptoms develop rapidly, usually over hours. A joint fluid exam may be needed to prove the diagnosis. Acute gout episodes may follow a minor injury, surgery, illness or medication change. Gout occurs more often in men. It tends to be an inherited (passed down from a family member) condition. Your chance of having gout is increased if you take certain medications. These include water pills (diuretics), aspirin, niacin, and cyclosporine. Kidney disease and alcohol consumption may also increase your chances of getting gout. Months or years can pass between gout attacks.  Treatment includes ice, rest and raising the affected limb until the swelling and pain are better.Anti-inflammatory medicine usually brings about dramatic relief of pain, redness and swelling within 2 to 3 days. Other medications can also be effective. Increase your fluid intake. Do not drink alcohol or eat liver, sweetbreads, or sardines. Long-term gout management may require medicine to lower blood uric acid levels, or changing or stopping diuretics. Please see your caregiver if your condition is not better after 1 to 2 days of treatment.  SEEK IMMEDIATE MEDICAL CARE IF:  You have more serious symptoms such as a fever, skin rash, diarrhea, vomiting, or other joint pains. Document Released: 08/18/2004 Document Revised: 06/30/2011 Document Reviewed: 09/01/2008 [facility] Patient Information 2012 Jette.

## 2012-01-12 NOTE — Telephone Encounter (Signed)
Patient aware and will make appt for the first of the month when she has some money

## 2012-01-12 NOTE — Telephone Encounter (Signed)
States she can't come in until the first of the month when she gets paid and she couldn't afford the gout med sent in. Any other options?

## 2012-01-12 NOTE — Telephone Encounter (Signed)
Prescription for indomethacin sent instead, twice a day with food, if she can not get this try aleve twice a day it wont work as well but should help some

## 2012-01-12 NOTE — Telephone Encounter (Signed)
Please let her know she needs to come in for a visit, she has not been seen since September of 2013, please see if she can come in tomorrow. I will send the gout medicine

## 2012-01-12 NOTE — ED Notes (Signed)
Pt c/o rt foot pain since yesterday and denies any injury. Pt states she has a hx of gout.

## 2012-01-12 NOTE — ED Provider Notes (Signed)
Medical screening examination/treatment/procedure(s) were performed by non-physician practitioner and as supervising physician I was immediately available for consultation/collaboration.   Trisha Mangle, MD 01/12/12 [phone]

## 2012-01-12 NOTE — ED Provider Notes (Signed)
History     CSN: [id number]  Arrival date & time 01/12/12  1735   First MD Initiated Contact with Patient 01/12/12 1833      Chief Complaint  Patient presents with  . Gout    (Consider location/radiation/quality/duration/timing/severity/associated sxs/prior treatment) HPI Comments: Patient states she has a history of gout that usually bothers the right foot. Patient states that since June 19 she's been having increasing pain in the right first toe and right mid foot. She states this pain is similar to the gout attacks she has had in the past. The patient had medications called in for her gout earlier today. The patient states she has not been by to pick those up because of the expense. She states however she cannot take the pain and presents to the emergency department for assistance with this pain.  The history is provided by the patient.    Past Medical History  Diagnosis Date  . Obesity hypoventilation syndrome   . Obstructive sleep apnea   . Osteoarthritis   . Low back pain   . HTN (hypertension)   . Hyperlipidemia   . Hyperlipidemia   . GERD (gastroesophageal reflux disease)   . Depression   . COPD (chronic obstructive pulmonary disease)   . Anxiety   . Gout   . Achilles tendinitis     Past Surgical History  Procedure Date  . Cardiac catheterization 2006  . Tubal ligation     Family History  Problem Relation Age of Onset  . Heart disease Mother   . Thyroid disease Mother   . Heart failure Father   . Diabetes Brother   . Crohn's disease Daughter     History  Substance Use Topics  . Smoking status: Former Smoker -- 1.5 packs/day for 30 years    Types: Cigarettes    Quit date: 03/25/2005  . Smokeless tobacco: Never Used  . Alcohol Use: No    OB History    Grav Para Term Preterm Abortions TAB SAB Ect Mult Living                  Review of Systems  Allergies  Review of patient's allergies indicates no known allergies.  Home Medications    Current Outpatient Rx  Name Route Sig Dispense Refill  . ASPIRIN 81 MG PO TABS Oral Take 81 mg by mouth daily.      Marland Kitchen FLUTICASONE PROPIONATE 0.05 % EX CREA Topical Apply topically 2 (two) times daily. 30 g 0  . INDOMETHACIN 50 MG PO CAPS Oral Take 1 capsule (50 mg total) by mouth 2 (two) times daily with a meal. 20 capsule 0  . PRAVASTATIN SODIUM 40 MG PO TABS Oral Take 40 mg by mouth every morning.     Marland Kitchen COLCHICINE 0.6 MG PO TABS Oral Take 1 tablet (0.6 mg total) by mouth daily. 6 tablet 0  . HYDROCODONE-ACETAMINOPHEN 7.5-325 MG PO TABS Oral Take 1 tablet by mouth every 6 (six) hours as needed for pain. 20 tablet 0    BP 147/85  Pulse 64  Temp 98.5 F (36.9 C) (Oral)  Resp 16  Ht 5' 5.5" (1.664 m)  Wt 270 lb (122.471 kg)  BMI 44.25 kg/m2  SpO2 99%  Physical Exam  Nursing note and vitals reviewed. Constitutional: She is oriented to person, place, and time. She appears well-developed and well-nourished.  Non-toxic appearance.  HENT:  Head: Normocephalic.  Right Ear: Tympanic membrane and external ear normal.  Left Ear: Tympanic membrane  and external ear normal.  Eyes: EOM and lids are normal. Pupils are equal, round, and reactive to light.  Neck: Normal range of motion. Neck supple. Carotid bruit is not present.  Cardiovascular: Normal rate, regular rhythm, normal heart sounds, intact distal pulses and normal pulses.   Pulmonary/Chest: Breath sounds normal. No respiratory distress.  Abdominal: Soft. Bowel sounds are normal. There is no tenderness. There is no guarding.  Musculoskeletal: Normal range of motion.       Pain to palpation or movement of the first MP joint of the right foot. Pain with movement of the mid foot. Pain with attempted movement of the right ankle. No hot joints appreciated. No swelling or joint effusion appreciated.  Lymphadenopathy:       Head (right side): No submandibular adenopathy present.       Head (left side): No submandibular adenopathy present.     She has no cervical adenopathy.  Neurological: She is alert and oriented to person, place, and time. She has normal strength. No cranial nerve deficit or sensory deficit.  Skin: Skin is warm and dry.  Psychiatric: She has a normal mood and affect. Her speech is normal.    ED Course  Procedures (including critical care time)  Labs Reviewed - No data to display No results found.   1. Gout, arthritis       MDM  I have reviewed nursing notes, vital signs, and all appropriate lab and imaging results for this patient. Patient has history of gout, and feels that she is having another" gout attack". The patient was given an injection of morphine, and a dose of colchicine here in the department. Prescription for culture seen and Norco given to the patient. The patient is to followup with the physician office next week.       Lenox Ahr, [place] 01/12/12 1910

## 2012-01-12 NOTE — ED Notes (Signed)
Pt alert & oriented x4, stable gait. Pt given discharge instructions, paperwork & prescription(s). Patient instructed to stop at the registration desk to finish any additional paperwork. pt verbalized understanding. Pt left department w/ no further questions.  

## 2012-01-12 NOTE — Telephone Encounter (Signed)
Please advise 

## 2012-01-12 NOTE — ED Notes (Signed)
Pt has gout to right foot, pain since yesterday --pmd called med in for treatment but pt can't afford it

## 2012-01-13 NOTE — Telephone Encounter (Signed)
Patient aware.

## 2012-01-25 ENCOUNTER — Ambulatory Visit (INDEPENDENT_AMBULATORY_CARE_PROVIDER_SITE_OTHER): Payer: Medicare Other | Admitting: Family Medicine

## 2012-01-25 ENCOUNTER — Encounter: Payer: Self-pay | Admitting: Family Medicine

## 2012-01-25 VITALS — BP 126/88 | HR 78 | Resp 18 | Ht 66.0 in | Wt 264.0 lb

## 2012-01-25 DIAGNOSIS — E785 Hyperlipidemia, unspecified: Secondary | ICD-10-CM

## 2012-01-25 DIAGNOSIS — G4733 Obstructive sleep apnea (adult) (pediatric): Secondary | ICD-10-CM

## 2012-01-25 DIAGNOSIS — R2689 Other abnormalities of gait and mobility: Secondary | ICD-10-CM

## 2012-01-25 DIAGNOSIS — J449 Chronic obstructive pulmonary disease, unspecified: Secondary | ICD-10-CM

## 2012-01-25 DIAGNOSIS — E678 Other specified hyperalimentation: Secondary | ICD-10-CM

## 2012-01-25 DIAGNOSIS — W19XXXA Unspecified fall, initial encounter: Secondary | ICD-10-CM

## 2012-01-25 DIAGNOSIS — J4489 Other specified chronic obstructive pulmonary disease: Secondary | ICD-10-CM

## 2012-01-25 DIAGNOSIS — M109 Gout, unspecified: Secondary | ICD-10-CM

## 2012-01-25 DIAGNOSIS — R42 Dizziness and giddiness: Secondary | ICD-10-CM

## 2012-01-25 DIAGNOSIS — R7302 Impaired glucose tolerance (oral): Secondary | ICD-10-CM

## 2012-01-25 DIAGNOSIS — R7309 Other abnormal glucose: Secondary | ICD-10-CM

## 2012-01-25 DIAGNOSIS — R269 Unspecified abnormalities of gait and mobility: Secondary | ICD-10-CM

## 2012-01-25 DIAGNOSIS — I10 Essential (primary) hypertension: Secondary | ICD-10-CM

## 2012-01-25 LAB — LIPID PANEL
HDL: 40 mg/dL (ref >39)
LDL Cholesterol: 176 mg/dL — ABNORMAL HIGH (ref 0–99)
Total CHOL/HDL Ratio: 6.2 Ratio
VLDL: 31 mg/dL (ref 0–40)

## 2012-01-25 LAB — COMPREHENSIVE METABOLIC PANEL
ALT: 13 U/L (ref 0–35)
CO2: 30 mEq/L (ref 19–32)
Calcium: 9.4 mg/dL (ref 8.4–10.5)
Chloride: 102 mEq/L (ref 96–112)
Creat: 0.95 mg/dL (ref 0.50–1.10)
Total Protein: 8 g/dL (ref 6.0–8.3)

## 2012-01-25 LAB — CBC WITH DIFFERENTIAL/PLATELET
Basophils Relative: 1 % (ref 0–1)
Eosinophils Absolute: 0.3 10*3/uL (ref 0.0–0.7)
Eosinophils Relative: 5 % (ref 0–5)
Lymphs Abs: 3.1 10*3/uL (ref 0.7–4.0)
MCH: 28.5 pg (ref 26.0–34.0)
MCHC: 32.4 g/dL (ref 30.0–36.0)
MCV: 88 fL (ref 78.0–100.0)
Monocytes Absolute: 0.5 10*3/uL (ref 0.1–1.0)
Monocytes Relative: 8 % (ref 3–12)
WBC: 6.5 10*3/uL (ref 4.0–10.5)

## 2012-01-25 LAB — VITAMIN B12: Vitamin B-12: 450 pg/mL (ref 211–911)

## 2012-01-25 MED ORDER — ATORVASTATIN CALCIUM 20 MG PO TABS: 20.0000 mg | ORAL_TABLET | Freq: Every day | ORAL | Status: DC

## 2012-01-25 MED ORDER — COLCHICINE 0.6 MG PO TABS: 0.6000 mg | ORAL_TABLET | Freq: Every day | ORAL | Status: DC

## 2012-01-25 NOTE — Assessment & Plan Note (Signed)
Will restart colchicine once a day for gout pain

## 2012-01-25 NOTE — Patient Instructions (Signed)
Get the bloodwork done today We will schedule an MRI of your head Take the  Medicine for the gout F/U in 6 weeks

## 2012-01-25 NOTE — Progress Notes (Signed)
  Subjective:    Patient ID: [name], female    DOB: 1951/03/14, 61 y.o.   MRN: [id number]  HPI Patient presents for chronic routine followup. She has not been in the office since August of last year. She states she's had difficulty with transportation. She has not been on any of her medications since 2012 as well. She was recently evaluated in emergency room for gout of the right great toe. She completed 7 days worth of culture seen but continues to have pain and swelling. She also notes that she has fallen multiple times over the past month she feels like her balance is off. She occasionally has dizzy episodes. She's not taking any other herbs or vitamins over-the-counter. She continues to wear her oxygen and CPAP at night but otherwise her breathing has been very good.   Review of Systems - per above   GEN- denies fatigue, fever, weight loss,weakness, recent illness HEENT- denies eye drainage, change in vision, nasal discharge, CVS- denies chest pain, palpitations RESP- denies SOB, cough, wheeze ABD- denies N/V, change in stools, abd pain GU- denies dysuria, hematuria, dribbling, incontinence MSK- denies joint pain, muscle aches, injury Neuro- denies headache, +dizziness, denies  syncope, seizure activity      Objective:   Physical Exam GEN- NAD, alert and oriented x3, obese  HEENT- PERRL, EOMI, non injected sclera, pink conjunctiva, MMM, oropharynx clear, fundoscopic exam benign  Neck- Supple,  CVS- RRR, no murmur RESP-CTAB ABD-NABS,soft,NT,ND EXT- mild swelling Right great toe, TTP decreased ROM, no warmth, feet dirty  With debri on soles  Monofilament-normal exam, normal propioception Neuro-CNII-XII in tact, normal finger to nose, neg rhomberg, normal gait  Pulses- Radial, DP- 2+        Assessment & Plan:

## 2012-01-25 NOTE — Assessment & Plan Note (Signed)
Unchanged she continues his CPAP and benefits from this. CPAP obtained from [facility]

## 2012-01-25 NOTE — Assessment & Plan Note (Signed)
Her breathing has been stable. She's not on any medications currently.

## 2012-01-25 NOTE — Assessment & Plan Note (Signed)
Patient has had recurrent falls. However no signs of vertigo. She has not been seen in quite some time. I will start with baseline workup as well as A1c and B12. I will send for MRI of brain. Question if she is having hypoxic episodes as she does have COPD as well as hypoventilation syndrome. Neurological exam reassuring

## 2012-01-25 NOTE — Assessment & Plan Note (Signed)
Her last A1C was 6.0%, will repeat with her new symptoms of feeling off balance

## 2012-01-25 NOTE — Addendum Note (Signed)
Addended by: Vic Blackbird F on: 01/25/2012 11:19 PM   Modules accepted: Orders

## 2012-01-25 NOTE — Assessment & Plan Note (Signed)
She has not seen pulmonary recently. She's doing well on her CPAP.

## 2012-01-25 NOTE — Assessment & Plan Note (Signed)
Doing well without any antihypertensives I will check her labs

## 2012-01-25 NOTE — Assessment & Plan Note (Signed)
Check fasting lipid panel and I will decide on dose of pravastatin needed

## 2012-02-01 ENCOUNTER — Telehealth: Payer: Self-pay | Admitting: Family Medicine

## 2012-02-03 ENCOUNTER — Telehealth: Payer: Self-pay | Admitting: Family Medicine

## 2012-02-06 ENCOUNTER — Ambulatory Visit (HOSPITAL_COMMUNITY)
Admission: RE | Admit: 2012-02-06 | Discharge: 2012-02-06 | Disposition: A | Discharge status: Home / Self Care | Payer: Medicare Other | Source: Ambulatory Visit | Attending: Family Medicine | Admitting: Family Medicine

## 2012-02-06 ENCOUNTER — Ambulatory Visit (HOSPITAL_COMMUNITY): Payer: Medicare Other

## 2012-02-06 DIAGNOSIS — R2689 Other abnormalities of gait and mobility: Secondary | ICD-10-CM

## 2012-02-06 DIAGNOSIS — R42 Dizziness and giddiness: Secondary | ICD-10-CM

## 2012-02-06 DIAGNOSIS — W19XXXA Unspecified fall, initial encounter: Secondary | ICD-10-CM

## 2012-02-06 DIAGNOSIS — R269 Unspecified abnormalities of gait and mobility: Secondary | ICD-10-CM | POA: Insufficient documentation

## 2012-02-08 ENCOUNTER — Telehealth: Payer: Self-pay | Admitting: Family Medicine

## 2012-02-08 DIAGNOSIS — Q048 Other specified congenital malformations of brain: Secondary | ICD-10-CM

## 2012-02-08 DIAGNOSIS — R296 Repeated falls: Secondary | ICD-10-CM

## 2012-02-08 NOTE — Telephone Encounter (Signed)
Pt given results  

## 2012-02-28 ENCOUNTER — Ambulatory Visit (INDEPENDENT_AMBULATORY_CARE_PROVIDER_SITE_OTHER): Payer: Medicare Other | Admitting: Family Medicine

## 2012-02-28 ENCOUNTER — Encounter: Payer: Self-pay | Admitting: Family Medicine

## 2012-02-28 VITALS — BP 140/80 | HR 67 | Resp 16 | Ht 66.0 in | Wt 270.0 lb

## 2012-02-28 DIAGNOSIS — M549 Dorsalgia, unspecified: Secondary | ICD-10-CM

## 2012-02-28 MED ORDER — MELOXICAM 7.5 MG PO TABS: ORAL_TABLET | ORAL | Status: AC

## 2012-02-28 MED ORDER — CYCLOBENZAPRINE HCL 5 MG PO TABS: 5.0000 mg | ORAL_TABLET | Freq: Every evening | ORAL | Status: DC | PRN

## 2012-02-28 MED ORDER — KETOROLAC TROMETHAMINE 60 MG/2ML IM SOLN
60.0000 mg | Freq: Once | INTRAMUSCULAR | Status: AC
  Administered 2012-02-28: 60 mg via INTRAMUSCULAR

## 2012-02-28 NOTE — Patient Instructions (Addendum)
Take the anti-inflammatories- once a day Flexeril muscle relaxant at night If you do not improve in 4 weeks please call  F/U 3 months for regular appt   Back Pain, Adult Low back pain is very common. About 1 in 5 people have back pain. The cause of low back pain is rarely dangerous. The pain often gets better over time. About half of people with a sudden onset of back pain feel better in just 2 weeks. About 8 in 10 people feel better by 6 weeks.   CAUSES Some common causes of back pain include:  Strain of the muscles or ligaments supporting the spine.   Wear and tear (degeneration) of the spinal discs.   Arthritis.   Direct injury to the back.  DIAGNOSIS Most of the time, the direct cause of low back pain is not known. However, back pain can be treated effectively even when the exact cause of the pain is unknown. Answering your caregiver's questions about your overall health and symptoms is one of the most accurate ways to make sure the cause of your pain is not dangerous. If your caregiver needs more information, he or she may order lab work or imaging tests (X-rays or MRIs). However, even if imaging tests show changes in your back, this usually does not require surgery. HOME CARE INSTRUCTIONS For many people, back pain returns. Since low back pain is rarely dangerous, it is often a condition that people can learn to manage on their own.    Remain active. It is stressful on the back to sit or stand in one place. Do not sit, drive, or stand in one place for more than 30 minutes at a time. Take short walks on level surfaces as soon as pain allows. Try to increase the length of time you walk each day.   Do not stay in bed. Resting more than 1 or 2 days can delay your recovery.   Do not avoid exercise or work. Your body is made to move. It is not dangerous to be active, even though your back may hurt. Your back will likely heal faster if you return to being active before your pain is gone.    Pay attention to your body when you  bend and lift. Many people have less discomfort when lifting if they bend their knees, keep the load close to their bodies, and avoid twisting. Often, the most comfortable positions are those that put less stress on your recovering back.   Find a comfortable position to sleep. Use a firm mattress and lie on your side with your knees slightly bent. If you lie on your back, put a pillow under your knees.   Only take over-the-counter or prescription medicines as directed by your caregiver. Over-the-counter medicines to reduce pain and inflammation are often the most helpful. Your caregiver may prescribe muscle relaxant drugs. These medicines help dull your pain so you can more quickly return to your normal activities and healthy exercise.   Put ice on the injured area.   Put ice in a plastic bag.   Place a towel between your skin and the bag.   Leave the ice on for 15 to 20 minutes, 3 to 4 times a day for the first 2 to 3 days. After that, ice and heat may be alternated to reduce pain and spasms.   Ask your caregiver about trying back exercises and gentle massage. This may be of some benefit.   Avoid feeling anxious or stressed.  Stress increases muscle tension and can worsen back pain. It is important to recognize when you are anxious or stressed and learn ways to manage it. Exercise is a great option.  SEEK MEDICAL CARE IF:  You have pain that is not relieved with rest or medicine.   You have pain that does not improve in 1 week.   You have new symptoms.   You are generally not feeling well.  SEEK IMMEDIATE MEDICAL CARE IF:    You have pain that radiates from your back into your legs.   You develop new bowel or bladder control problems.   You have unusual weakness or numbness in your arms or legs.   You develop nausea or vomiting.   You develop abdominal pain.   You feel faint.  Document Released: 07/11/2005 Document Revised: 06/30/2011  Document Reviewed: 11/29/2010 [facility] Patient Information 2012 [place].

## 2012-03-02 NOTE — Telephone Encounter (Signed)
Patient is aware 

## 2012-03-04 DIAGNOSIS — M549 Dorsalgia, unspecified: Secondary | ICD-10-CM | POA: Insufficient documentation

## 2012-03-04 NOTE — Assessment & Plan Note (Signed)
Acute on chronic back pain, start NSAIDS, flexeril, if no improvement in 4 weeks, image

## 2012-03-04 NOTE — Progress Notes (Signed)
  Subjective:    Patient ID: [name], female    DOB: 1951-05-12, 61 y.o.   MRN: [id number]  HPI  Back pain x 24 hours, no specific injury, has had Back pain in the past, no meds taken, pain in lower back, no change in bowel or bladder, no weakness in legs,back pain 7/10    Review of Systems - per above     Objective:   Physical Exam GEN-NAD, alert and oriented x 3 Back- TTP lumbar spine and thoracic region, mild spasm in paraspinal, able to squat, pain with flexion and extension, able to walk on toes, neg SLR Hip- normal ER/IR Knee- good ROM,no effusion Neuro- motor equal bilat lower ext, sensation grossly in tact       Assessment & Plan:

## 2012-04-30 ENCOUNTER — Telehealth: Payer: Self-pay | Admitting: Family Medicine

## 2012-04-30 MED ORDER — FLUTICASONE PROPIONATE 0.05 % EX CREA: TOPICAL_CREAM | CUTANEOUS | Status: DC

## 2012-04-30 NOTE — Telephone Encounter (Signed)
Cream sent, please ask pt did she go to neurology appointment

## 2012-04-30 NOTE — Telephone Encounter (Signed)
Do you know what she is referring to or do I need to call and clarify?

## 2012-04-30 NOTE — Telephone Encounter (Signed)
Called pt no answer °

## 2012-05-01 NOTE — Telephone Encounter (Signed)
noted 

## 2012-05-01 NOTE — Telephone Encounter (Signed)
She said she had to reschedule her neurology appt

## 2012-05-14 ENCOUNTER — Other Ambulatory Visit: Payer: Self-pay | Admitting: Family Medicine

## 2012-05-14 DIAGNOSIS — IMO0001 Reserved for inherently not codable concepts without codable children: Secondary | ICD-10-CM

## 2012-05-22 ENCOUNTER — Ambulatory Visit (HOSPITAL_COMMUNITY)
Admission: RE | Admit: 2012-05-22 | Discharge: 2012-05-22 | Disposition: A | Discharge status: Home / Self Care | Payer: Medicare Other | Source: Ambulatory Visit | Attending: Family Medicine | Admitting: Family Medicine

## 2012-05-22 DIAGNOSIS — IMO0001 Reserved for inherently not codable concepts without codable children: Secondary | ICD-10-CM

## 2012-05-22 DIAGNOSIS — Z1231 Encounter for screening mammogram for malignant neoplasm of breast: Secondary | ICD-10-CM | POA: Insufficient documentation

## 2012-05-31 ENCOUNTER — Encounter: Payer: Self-pay | Admitting: Family Medicine

## 2012-05-31 ENCOUNTER — Ambulatory Visit: Payer: Medicare Other | Admitting: Family Medicine

## 2012-06-18 ENCOUNTER — Encounter: Payer: Self-pay | Admitting: Family Medicine

## 2012-06-18 ENCOUNTER — Telehealth: Payer: Self-pay | Admitting: Family Medicine

## 2012-06-18 ENCOUNTER — Ambulatory Visit (INDEPENDENT_AMBULATORY_CARE_PROVIDER_SITE_OTHER): Payer: Medicare Other | Admitting: Family Medicine

## 2012-06-18 VITALS — BP 162/80 | HR 70 | Resp 15 | Ht 66.0 in | Wt 270.1 lb

## 2012-06-18 DIAGNOSIS — J449 Chronic obstructive pulmonary disease, unspecified: Secondary | ICD-10-CM

## 2012-06-18 DIAGNOSIS — M549 Dorsalgia, unspecified: Secondary | ICD-10-CM

## 2012-06-18 DIAGNOSIS — I10 Essential (primary) hypertension: Secondary | ICD-10-CM

## 2012-06-18 DIAGNOSIS — J4489 Other specified chronic obstructive pulmonary disease: Secondary | ICD-10-CM

## 2012-06-18 DIAGNOSIS — E785 Hyperlipidemia, unspecified: Secondary | ICD-10-CM

## 2012-06-18 DIAGNOSIS — G4733 Obstructive sleep apnea (adult) (pediatric): Secondary | ICD-10-CM

## 2012-06-18 MED ORDER — PRAVASTATIN SODIUM 40 MG PO TABS: 40.0000 mg | ORAL_TABLET | Freq: Every evening | ORAL | Status: DC

## 2012-06-18 MED ORDER — LISINOPRIL 10 MG PO TABS: 10.0000 mg | ORAL_TABLET | Freq: Every day | ORAL | Status: DC

## 2012-06-18 NOTE — Assessment & Plan Note (Signed)
Much improved

## 2012-06-18 NOTE — Assessment & Plan Note (Signed)
BP has deteriorated from the past visit, will start her on lisinopril, advised to use [facility] for meds Will get [facility] to assist

## 2012-06-18 NOTE — Assessment & Plan Note (Signed)
She can not afford lipitor change back to pravastatin 40mg , per above

## 2012-06-18 NOTE — Progress Notes (Signed)
  Subjective:    Patient ID: [name], female    DOB: [birth year], 61 y.o.   MRN: [id number]  HPI  Patient here to follow chronic medical problems. Her back pain has improved since her last visit. She tells me today that she is unable to afford any of her medications. She's not been taking any medicines. She does have Medicare but is unable to afford to $3 co-pay. She still getting her oxygen however has not had money potatoes co-pays but it is still delivered. She's not followed up with her lung Doctor or any other specialist. She does get food stamps and this is the local food Banks. Has pain on side of left great toe  Review of Systems - per bove GEN- denies fatigue, fever, weight loss,weakness, recent illness HEENT- denies eye drainage, change in vision, nasal discharge, CVS- denies chest pain, palpitations RESP- denies SOB, cough, wheeze ABD- denies N/V, change in stools, abd pain GU- denies dysuria, hematuria, dribbling, incontinence MSK- +joint pain, muscle aches, injury Neuro- denies headache, dizziness, syncope, seizure activity       Objective:   Physical Exam  GEN- NAD, alert and oriented x3, obese  HEENT- PERRL, EOMI, non injected sclera, pink conjunctiva, MMM, oropharynx clear,  Neck- Supple,  CVS- RRR, no murmur RESP-CTAB ABD-NABS,soft,NT,ND EXT- TTP over left bunion Pulses- Radial, DP- 2+ Skin- dry flaky skin      Assessment & Plan:

## 2012-06-18 NOTE — Telephone Encounter (Signed)
Noted  

## 2012-06-18 NOTE — Patient Instructions (Addendum)
[facility] nurse will be calling to help with medications  Try to get the 2 pills- blood pressure and cholesterol, from [facility]- on $4 list Continue the aspirin If you have gout use 2 aleve  F/U 3 months

## 2012-06-18 NOTE — Assessment & Plan Note (Signed)
On CPAP , continues to benefit from this

## 2012-06-18 NOTE — Assessment & Plan Note (Signed)
Currently doing well, no hypoxia noted during day, continue oxygen, she will f/u with pulmonary when able, [facility] will be able to assist [place] with this

## 2012-07-13 ENCOUNTER — Ambulatory Visit: Payer: Medicare Other | Admitting: Critical Care Medicine

## 2012-07-19 ENCOUNTER — Emergency Department (HOSPITAL_COMMUNITY)
Admission: EM | Admit: 2012-07-19 | Discharge: 2012-07-19 | Disposition: A | Discharge status: Home / Self Care | Payer: Medicare Other | Attending: Emergency Medicine | Admitting: Emergency Medicine

## 2012-07-19 ENCOUNTER — Encounter (HOSPITAL_COMMUNITY): Payer: Self-pay | Admitting: Emergency Medicine

## 2012-07-19 ENCOUNTER — Emergency Department (HOSPITAL_COMMUNITY): Payer: Medicare Other

## 2012-07-19 DIAGNOSIS — Z8739 Personal history of other diseases of the musculoskeletal system and connective tissue: Secondary | ICD-10-CM | POA: Insufficient documentation

## 2012-07-19 DIAGNOSIS — M79646 Pain in unspecified finger(s): Secondary | ICD-10-CM

## 2012-07-19 DIAGNOSIS — E662 Morbid (severe) obesity with alveolar hypoventilation: Secondary | ICD-10-CM | POA: Insufficient documentation

## 2012-07-19 DIAGNOSIS — M2550 Pain in unspecified joint: Secondary | ICD-10-CM | POA: Insufficient documentation

## 2012-07-19 DIAGNOSIS — Z7982 Long term (current) use of aspirin: Secondary | ICD-10-CM | POA: Insufficient documentation

## 2012-07-19 DIAGNOSIS — Z8659 Personal history of other mental and behavioral disorders: Secondary | ICD-10-CM | POA: Insufficient documentation

## 2012-07-19 DIAGNOSIS — Z79899 Other long term (current) drug therapy: Secondary | ICD-10-CM | POA: Insufficient documentation

## 2012-07-19 DIAGNOSIS — E785 Hyperlipidemia, unspecified: Secondary | ICD-10-CM | POA: Insufficient documentation

## 2012-07-19 DIAGNOSIS — G4733 Obstructive sleep apnea (adult) (pediatric): Secondary | ICD-10-CM | POA: Insufficient documentation

## 2012-07-19 DIAGNOSIS — I10 Essential (primary) hypertension: Secondary | ICD-10-CM | POA: Insufficient documentation

## 2012-07-19 DIAGNOSIS — Z862 Personal history of diseases of the blood and blood-forming organs and certain disorders involving the immune mechanism: Secondary | ICD-10-CM | POA: Insufficient documentation

## 2012-07-19 DIAGNOSIS — Z8719 Personal history of other diseases of the digestive system: Secondary | ICD-10-CM | POA: Insufficient documentation

## 2012-07-19 DIAGNOSIS — J449 Chronic obstructive pulmonary disease, unspecified: Secondary | ICD-10-CM | POA: Insufficient documentation

## 2012-07-19 DIAGNOSIS — Z8639 Personal history of other endocrine, nutritional and metabolic disease: Secondary | ICD-10-CM | POA: Insufficient documentation

## 2012-07-19 DIAGNOSIS — M6740 Ganglion, unspecified site: Secondary | ICD-10-CM | POA: Insufficient documentation

## 2012-07-19 DIAGNOSIS — M1990 Unspecified osteoarthritis, unspecified site: Secondary | ICD-10-CM | POA: Insufficient documentation

## 2012-07-19 DIAGNOSIS — J4489 Other specified chronic obstructive pulmonary disease: Secondary | ICD-10-CM | POA: Insufficient documentation

## 2012-07-19 DIAGNOSIS — Z87891 Personal history of nicotine dependence: Secondary | ICD-10-CM | POA: Insufficient documentation

## 2012-07-19 DIAGNOSIS — M79609 Pain in unspecified limb: Secondary | ICD-10-CM | POA: Insufficient documentation

## 2012-07-19 MED ORDER — IBUPROFEN 800 MG PO TABS
800.0000 mg | ORAL_TABLET | Freq: Once | ORAL | Status: AC
  Administered 2012-07-19: 800 mg via ORAL
  Filled 2012-07-19: qty 1

## 2012-07-19 MED ORDER — HYDROCODONE-ACETAMINOPHEN 5-325 MG PO TABS: ORAL_TABLET | ORAL | Status: DC

## 2012-07-19 NOTE — ED Notes (Addendum)
Patient complaining of pain to left thumb, reports noticed knot in thumb this morning when she woke up. Denies injury.

## 2012-07-19 NOTE — ED Provider Notes (Signed)
History     CSN: [id number]  Arrival date & time 07/19/12  [id number]   First MD Initiated Contact with Patient 07/19/12 2104      Chief Complaint  Patient presents with  . Hand Pain    (Consider location/radiation/quality/duration/timing/severity/associated sxs/prior treatment) HPI Comments: Patient c/o pain and "knot" to her left thumb.  States she woke up this morning and noticed she was having pain to her thumb with movement and later noticed a raised area to the base of her thumb.  States the pain is worse with movement and resolves with rest.  She denies recent injury, redness, numbness or weakness of the thumb, or excessive warmth.  She also denies proximal tenderness.    Patient is a 61 y.o. female presenting with hand pain. The history is provided by the patient.  Hand Pain This is a new problem. The current episode started today. The problem occurs constantly. The problem has been unchanged. Associated symptoms include arthralgias. Pertinent negatives include no chills, fever, joint swelling, myalgias, nausea, neck pain, numbness, rash, swollen glands, visual change, vomiting or weakness. The symptoms are aggravated by bending (movement of the left thumb). She has tried nothing for the symptoms. The treatment provided no relief.    Past Medical History  Diagnosis Date  . Obesity hypoventilation syndrome   . Obstructive sleep apnea   . Osteoarthritis   . Low back pain   . HTN (hypertension)   . Hyperlipidemia   . Hyperlipidemia   . GERD (gastroesophageal reflux disease)   . Depression   . COPD (chronic obstructive pulmonary disease)   . Anxiety   . Gout   . Achilles tendinitis     Past Surgical History  Procedure Date  . Cardiac catheterization 2006  . Tubal ligation     Family History  Problem Relation Age of Onset  . Heart disease Mother   . Thyroid disease Mother   . Heart failure Father   . Diabetes Brother   . Crohn's disease Daughter     History    Substance Use Topics  . Smoking status: Former Smoker -- 1.5 packs/day for 30 years    Types: Cigarettes    Quit date: 03/25/2005  . Smokeless tobacco: Never Used  . Alcohol Use: No    OB History    Grav Para Term Preterm Abortions TAB SAB Ect Mult Living                  Review of Systems  Constitutional: Negative for fever and chills.  HENT: Negative for neck pain.   Gastrointestinal: Negative for nausea and vomiting.  Genitourinary: Negative for dysuria and difficulty urinating.  Musculoskeletal: Positive for arthralgias. Negative for myalgias and joint swelling.  Skin: Negative for color change, rash and wound.  Neurological: Negative for weakness and numbness.  All other systems reviewed and are negative.    Allergies  Review of patient's allergies indicates no known allergies.  Home Medications   Current Outpatient Rx  Name  Route  Sig  Dispense  Refill  . ASPIRIN 81 MG PO TABS   Oral   Take 81 mg by mouth daily.           Marland Kitchen FLUTICASONE PROPIONATE 0.05 % EX CREA      Apply to affected area 2 times daily   30 g   1   . LISINOPRIL 10 MG PO TABS   Oral   Take 1 tablet (10 mg total) by mouth daily.  30 tablet   4   . PRAVASTATIN SODIUM 40 MG PO TABS   Oral   Take 1 tablet (40 mg total) by mouth every evening.   30 tablet   4     BP 153/73  Pulse 64  Temp 97.9 F (36.6 C) (Oral)  Resp 18  Ht 5' 5.5" (1.664 m)  Wt 270 lb (122.471 kg)  BMI 44.25 kg/m2  SpO2 98%  Physical Exam  Nursing note and vitals reviewed. Constitutional: She is oriented to person, place, and time. She appears well-developed and well-nourished. No distress.  HENT:  Head: Normocephalic and atraumatic.  Cardiovascular: Normal rate, regular rhythm and normal heart sounds.   Pulmonary/Chest: Effort normal and breath sounds normal.  Musculoskeletal: She exhibits tenderness. She exhibits no edema.       Left hand: She exhibits tenderness. She exhibits normal range of  motion, no bony tenderness, normal two-point discrimination, normal capillary refill, no deformity, no laceration and no swelling. normal sensation noted. Normal strength noted.       Hands:      ttp at the base of the left thumb.  Small, movable papule present to same.  Radial pulse is brisk, distal sensation intact.  CR< 2 sec.  No bruising or bony deformity.  Patient has full ROM.  Neurological: She is alert and oriented to person, place, and time. She exhibits normal muscle tone. Coordination normal.  Skin: Skin is warm and dry.    ED Course  Procedures (including critical care time)  Labs Reviewed - No data to display Dg Finger Thumb Left  07/19/2012  *RADIOLOGY REPORT*  Clinical Data: Pain without trauma  LEFT THUMB 2+V  Comparison: None.  Findings: No radiodense foreign body. Negative for fracture, dislocation, or other acute abnormality.  Normal alignment and mineralization. No significant degenerative change.  Regional soft tissues unremarkable.  IMPRESSION:  Negative   Original Report Authenticated By: D. Wallace Going, MD       velcro thumb spica splint applied, pain improved, remains NV intact  MDM    Patient has a small palpable nodule in the proximal left thumb. No surrounding erythema, induration, or discoloration.  Likely ganglion cyst.   Pt agrees to follow-up with Dr. Aline Brochure.    Prescribed: norco #15   Bryn Perkin L. Braham, [place] 07/20/12 2154

## 2012-07-21 NOTE — ED Provider Notes (Signed)
Medical screening examination/treatment/procedure(s) were performed by non-physician practitioner and as supervising physician I was immediately available for consultation/collaboration.  Nat Christen, MD 07/21/12 0001

## 2012-07-30 ENCOUNTER — Telehealth: Payer: Self-pay | Admitting: Family Medicine

## 2012-07-31 ENCOUNTER — Telehealth: Payer: Self-pay

## 2012-07-31 ENCOUNTER — Ambulatory Visit: Payer: Medicare Other | Admitting: Critical Care Medicine

## 2012-07-31 NOTE — Telephone Encounter (Signed)
Rose from [facility] called- states that she has been out to see pt and BP has been very elevated (193/103) Pt refuses to get BP med because she doesn't have the money. [facility] called and got her help and her BP med was only $2.30 and nurse advised her to get it asap that it was very important to bring her BP down and pt still stated that she was not going to get it because she didn't have the money after the nurse had applied for assistance and was able to greatly reduce the price of med. She is currently on no meds. If she is still non compliant next week she will have to be discharged from [facility]

## 2012-08-01 NOTE — Telephone Encounter (Signed)
Velna Hatchet has spoke with the [facility] nurse about this patient

## 2012-08-06 NOTE — Telephone Encounter (Signed)
Noted, I have discussed with her importance of meds as well.

## 2012-08-08 ENCOUNTER — Ambulatory Visit: Payer: Medicare Other | Admitting: Orthopedic Surgery

## 2012-08-22 ENCOUNTER — Ambulatory Visit: Payer: Medicare Other | Admitting: Critical Care Medicine

## 2012-08-28 ENCOUNTER — Encounter: Payer: Self-pay | Admitting: Orthopedic Surgery

## 2012-08-28 ENCOUNTER — Ambulatory Visit (INDEPENDENT_AMBULATORY_CARE_PROVIDER_SITE_OTHER): Payer: Medicare Other | Admitting: Orthopedic Surgery

## 2012-08-28 VITALS — BP 150/96 | Ht 65.5 in | Wt 270.0 lb

## 2012-08-28 DIAGNOSIS — R2230 Localized swelling, mass and lump, unspecified upper limb: Secondary | ICD-10-CM

## 2012-08-28 DIAGNOSIS — R229 Localized swelling, mass and lump, unspecified: Secondary | ICD-10-CM

## 2012-08-28 NOTE — Patient Instructions (Addendum)
Left thumb surgery remove mass

## 2012-08-28 NOTE — H&P (Signed)
History and physical for surgical treatment of the left thumb removal of mass   Chief Complaint  Patient presents with  . Hand Pain    Left thumb pain, no injury. [facility] on 07-19-12.   The patient presents with a history of one month pain over the left thumb for which she went to the emergency room she had an x-ray and it did not show any major abnormalities she complains of a mass over the basilar joint of the left thumb with pain and tenderness some tingling and some catching. She is mainly having soreness but is interfering with her activities of daily living and she would like it removed  Review of systems includes a 14 system review she reported positive findings as redness and watering of the eyes COPD with wheezing and anxiety she reported all other systems as negative  Past Medical History  Diagnosis Date  . Obesity hypoventilation syndrome   . Obstructive sleep apnea   . Osteoarthritis   . Low back pain   . HTN (hypertension)   . Hyperlipidemia   . Hyperlipidemia   . GERD (gastroesophageal reflux disease)   . Depression   . COPD (chronic obstructive pulmonary disease)   . Anxiety   . Gout   . Achilles tendinitis     Past Surgical History  Procedure Date  . Cardiac catheterization 2006  . Tubal ligation     Family History  Problem Relation Age of Onset  . Heart disease Mother   . Thyroid disease Mother   . Heart failure Father   . Diabetes Brother   . Crohn's disease Daughter    History   Social History Narrative  . No narrative on file   Physical Exam(12)  Vital signs:   GENERAL: normal development   CDV: pulses are normal   Skin: normal  Lymph: nodes were not palpable/normal  Psychiatric: awake, alert and oriented  Neuro: normal sensation  Examination of the left thumb reveals a palpable mass in the subcutaneous region overlying the basilar joint of the thumb she also has tenderness and positive grind test in this area she has good  pinch normal extension power at the interphalangeal joint and metacarpophalangeal joint as well as basilar joint. Stability of the joint is normal although you can subluxate the base of the thumb out of the joint. Range of motion is otherwise normal  Impression Mass left thumb  Impression osteoarthritis basilar joint left thumb  The patient has been counseled that she will not get full pain relief but we can remove the mass she will have some pain at the basilar joint of the thumb and this will persist. She is still wanting to get the mass removed  We will arrange for excision of mass left thumb

## 2012-08-28 NOTE — Progress Notes (Signed)
Patient ID: [name], female   DOB: Feb 16, 1951, 62 y.o.   MRN: [id number] In the note section a full history and physical has been dictated in preparation for excision of mass left thumb  This is incorporated by reference

## 2012-09-05 ENCOUNTER — Telehealth: Payer: Self-pay | Admitting: Orthopedic Surgery

## 2012-09-05 NOTE — Telephone Encounter (Signed)
Per phone call to Cristopher Peru at [facility] at [phone]  No pre-authorization required for outpatient surgery for CPT 11420.

## 2012-09-06 ENCOUNTER — Encounter (HOSPITAL_COMMUNITY): Payer: Self-pay | Admitting: Pharmacy Technician

## 2012-09-07 NOTE — Patient Instructions (Addendum)
     [name]  09/07/2012   Your procedure is scheduled on:  09/14/2012  Report to [facility] at  94  AM.  Call this number if you have problems the morning of surgery: [phone]   Remember:   Do not eat food or drink liquids after midnight.   Take these medicines the morning of surgery with A SIP OF WATER: norco   Do not wear jewelry, make-up or nail polish.  Do not wear lotions, powders, or perfumes.  Do not shave 48 hours prior to surgery. Men may shave face and neck.  Do not bring valuables to the hospital.  Contacts, dentures or bridgework may not be worn into surgery.  Leave suitcase in the car. After surgery it may be brought to your room.  For patients admitted to the hospital, checkout time is 11:00 AM the day of discharge.   Patients discharged the day of surgery will not be allowed to drive  home.  Name and phone number of your driver: family  Special Instructions: Shower using CHG 2 nights before surgery and the night before surgery.  If you shower the day of surgery use CHG.  Use special wash - you have one bottle of CHG for all showers.  You should use approximately 1/3 of the bottle for each shower.   Please read over the following fact sheets that you were given: Pain Booklet, Coughing and Deep Breathing, MRSA Information, Surgical Site Infection Prevention, Anesthesia Post-op Instructions and Care and Recovery After Surgery  PATIENT INSTRUCTIONS POST-ANESTHESIA  IMMEDIATELY FOLLOWING SURGERY:  Do not drive or operate machinery for the first twenty four hours after surgery.  Do not make any important decisions for twenty four hours after surgery or while taking narcotic pain medications or sedatives.  If you develop intractable nausea and vomiting or a severe headache please notify your doctor immediately.  FOLLOW-UP:  Please make an appointment with your surgeon as instructed. You do not need to follow up with anesthesia unless specifically instructed to do  so.  WOUND CARE INSTRUCTIONS (if applicable):  Keep a dry clean dressing on the anesthesia/puncture wound site if there is drainage.  Once the wound has quit draining you may leave it open to air.  Generally you should leave the bandage intact for twenty four hours unless there is drainage.  If the epidural site drains for more than 36-48 hours please call the anesthesia department.  QUESTIONS?:  Please feel free to call your physician or the hospital operator if you have any questions, and they will be happy to assist you.

## 2012-09-10 ENCOUNTER — Encounter (HOSPITAL_COMMUNITY)
Admission: RE | Admit: 2012-09-10 | Discharge: 2012-09-10 | Disposition: A | Discharge status: Home / Self Care | Payer: Medicare Other | Source: Ambulatory Visit | Attending: Orthopedic Surgery | Admitting: Orthopedic Surgery

## 2012-09-10 ENCOUNTER — Encounter (HOSPITAL_COMMUNITY): Payer: Self-pay

## 2012-09-10 LAB — BASIC METABOLIC PANEL
BUN: 11 mg/dL (ref 6–23)
Calcium: 9.2 mg/dL (ref 8.4–10.5)
Chloride: 103 mEq/L (ref 96–112)
Creatinine, Ser: 0.93 mg/dL (ref 0.50–1.10)
GFR calc Af Amer: 74 mL/min — ABNORMAL LOW (ref >90)
GFR calc non Af Amer: 64 mL/min — ABNORMAL LOW (ref >90)

## 2012-09-10 LAB — SURGICAL PCR SCREEN: Staphylococcus aureus: NEGATIVE

## 2012-09-14 ENCOUNTER — Ambulatory Visit (HOSPITAL_COMMUNITY)
Admission: RE | Admit: 2012-09-14 | Discharge: 2012-09-14 | Disposition: A | Discharge status: Home / Self Care | Payer: Medicare Other | Source: Ambulatory Visit | Attending: Orthopedic Surgery | Admitting: Orthopedic Surgery

## 2012-09-14 ENCOUNTER — Encounter (HOSPITAL_COMMUNITY): Payer: Self-pay | Admitting: *Deleted

## 2012-09-14 ENCOUNTER — Encounter (HOSPITAL_COMMUNITY): Payer: Self-pay | Admitting: Anesthesiology

## 2012-09-14 ENCOUNTER — Encounter (HOSPITAL_COMMUNITY)
Admission: RE | Disposition: A | Discharge status: Home / Self Care | Payer: Self-pay | Source: Ambulatory Visit | Attending: Orthopedic Surgery

## 2012-09-14 ENCOUNTER — Ambulatory Visit (HOSPITAL_COMMUNITY): Payer: Medicare Other | Admitting: Anesthesiology

## 2012-09-14 DIAGNOSIS — J449 Chronic obstructive pulmonary disease, unspecified: Secondary | ICD-10-CM | POA: Insufficient documentation

## 2012-09-14 DIAGNOSIS — Z01810 Encounter for preprocedural cardiovascular examination: Secondary | ICD-10-CM | POA: Insufficient documentation

## 2012-09-14 DIAGNOSIS — I10 Essential (primary) hypertension: Secondary | ICD-10-CM | POA: Insufficient documentation

## 2012-09-14 DIAGNOSIS — M6740 Ganglion, unspecified site: Secondary | ICD-10-CM | POA: Insufficient documentation

## 2012-09-14 DIAGNOSIS — J4489 Other specified chronic obstructive pulmonary disease: Secondary | ICD-10-CM | POA: Insufficient documentation

## 2012-09-14 SURGERY — EXCISION METACARPAL MASS
Anesthesia: Regional | Site: Thumb | Laterality: Left | Wound class: Clean

## 2012-09-14 MED ORDER — DEXTROSE 5 % IV SOLN
3.0000 g | INTRAVENOUS | Status: DC
  Administered 2012-09-14: 3 g via INTRAVENOUS
  Filled 2012-09-14: qty 3000

## 2012-09-14 MED ORDER — CEFAZOLIN SODIUM 1-5 GM-% IV SOLN: 1.0000 g | Freq: Once | INTRAVENOUS | Status: DC

## 2012-09-14 MED ORDER — LIDOCAINE HCL (PF) 0.5 % IJ SOLN
INTRAMUSCULAR | Status: DC | PRN
  Administered 2012-09-14: 50 mL via INTRAVENOUS

## 2012-09-14 MED ORDER — ONDANSETRON HCL 4 MG/2ML IJ SOLN: 4.0000 mg | Freq: Once | INTRAMUSCULAR | Status: DC | PRN

## 2012-09-14 MED ORDER — FENTANYL CITRATE 0.05 MG/ML IJ SOLN
INTRAMUSCULAR | Status: AC
  Filled 2012-09-14: qty 2

## 2012-09-14 MED ORDER — MIDAZOLAM HCL 2 MG/2ML IJ SOLN
INTRAMUSCULAR | Status: AC
  Filled 2012-09-14: qty 2

## 2012-09-14 MED ORDER — CEFAZOLIN SODIUM 1-5 GM-% IV SOLN
INTRAVENOUS | Status: AC
  Filled 2012-09-14: qty 50

## 2012-09-14 MED ORDER — FENTANYL CITRATE 0.05 MG/ML IJ SOLN
INTRAMUSCULAR | Status: DC | PRN
  Administered 2012-09-14 (×2): 50 ug via INTRAVENOUS

## 2012-09-14 MED ORDER — PROPOFOL INFUSION 10 MG/ML OPTIME
INTRAVENOUS | Status: DC | PRN
  Administered 2012-09-14: 35 ug/kg/min via INTRAVENOUS

## 2012-09-14 MED ORDER — PROPOFOL 10 MG/ML IV EMUL
INTRAVENOUS | Status: AC
  Filled 2012-09-14: qty 20

## 2012-09-14 MED ORDER — HYDROCODONE-ACETAMINOPHEN 5-325 MG PO TABS: 1.0000 | ORAL_TABLET | Freq: Four times a day (QID) | ORAL | Status: DC | PRN

## 2012-09-14 MED ORDER — LACTATED RINGERS IV SOLN
INTRAVENOUS | Status: DC
  Administered 2012-09-14: 1000 mL via INTRAVENOUS

## 2012-09-14 MED ORDER — MIDAZOLAM HCL 2 MG/2ML IJ SOLN
1.0000 mg | INTRAMUSCULAR | Status: DC | PRN
  Administered 2012-09-14: 2 mg via INTRAVENOUS

## 2012-09-14 MED ORDER — CEFAZOLIN SODIUM-DEXTROSE 2-3 GM-% IV SOLR: 2.0000 g | Freq: Once | INTRAVENOUS | Status: DC

## 2012-09-14 MED ORDER — FENTANYL CITRATE 0.05 MG/ML IJ SOLN
25.0000 ug | INTRAMUSCULAR | Status: DC | PRN
  Administered 2012-09-14: 50 ug via INTRAVENOUS

## 2012-09-14 MED ORDER — LIDOCAINE HCL (PF) 0.5 % IJ SOLN
INTRAMUSCULAR | Status: AC
  Filled 2012-09-14: qty 50

## 2012-09-14 MED ORDER — CEFAZOLIN SODIUM-DEXTROSE 2-3 GM-% IV SOLR
INTRAVENOUS | Status: AC
  Filled 2012-09-14: qty 50

## 2012-09-14 MED ORDER — BUPIVACAINE HCL (PF) 0.25 % IJ SOLN
INTRAMUSCULAR | Status: DC | PRN
  Administered 2012-09-14: 10 mL

## 2012-09-14 MED ORDER — BUPIVACAINE HCL (PF) 0.25 % IJ SOLN
INTRAMUSCULAR | Status: AC
  Filled 2012-09-14: qty 30

## 2012-09-14 MED ORDER — CHLORHEXIDINE GLUCONATE 4 % EX LIQD: 60.0000 mL | Freq: Once | CUTANEOUS | Status: DC

## 2012-09-14 MED ORDER — SODIUM CHLORIDE 0.9 % IR SOLN
Status: DC | PRN
  Administered 2012-09-14: 1000 mL

## 2012-09-14 SURGICAL SUPPLY — 47 items
BAG HAMPER (MISCELLANEOUS) ×2 IMPLANT
BANDAGE ELASTIC 3 VELCRO ST LF (GAUZE/BANDAGES/DRESSINGS) ×2 IMPLANT
BANDAGE ESMARK 4X12 BL STRL LF (DISPOSABLE) IMPLANT
BANDAGE GAUZE ELAST BULKY 4 IN (GAUZE/BANDAGES/DRESSINGS) ×2 IMPLANT
BLADE SURG 15 STRL LF DISP TIS (BLADE) ×1 IMPLANT
BLADE SURG 15 STRL SS (BLADE) ×2
BNDG CMPR 12X4 ELC STRL LF (DISPOSABLE) ×1
BNDG COHESIVE 4X5 TAN NS LF (GAUZE/BANDAGES/DRESSINGS) ×2 IMPLANT
BNDG ESMARK 4X12 BLUE STRL LF (DISPOSABLE) ×2
CHLORAPREP W/TINT 26ML (MISCELLANEOUS) ×2 IMPLANT
CLOTH BEACON ORANGE TIMEOUT ST (SAFETY) ×2 IMPLANT
COVER LIGHT HANDLE STERIS (MISCELLANEOUS) ×4 IMPLANT
CUFF TOURNIQUET SINGLE 18IN (TOURNIQUET CUFF) ×2 IMPLANT
DECANTER SPIKE VIAL GLASS SM (MISCELLANEOUS) ×2 IMPLANT
DRESSING TELFA 8X3 (GAUZE/BANDAGES/DRESSINGS) ×1 IMPLANT
DRSG XEROFORM 1X8 (GAUZE/BANDAGES/DRESSINGS) ×2 IMPLANT
ELECT NDL TIP 2.8 STRL (NEEDLE) ×1 IMPLANT
ELECT NEEDLE TIP 2.8 STRL (NEEDLE) ×2 IMPLANT
ELECT REM PT RETURN 9FT ADLT (ELECTROSURGICAL) ×2
ELECTRODE REM PT RTRN 9FT ADLT (ELECTROSURGICAL) ×1 IMPLANT
FORMALIN 10 PREFIL 120ML (MISCELLANEOUS) ×2 IMPLANT
GLOVE BIOGEL PI IND STRL 7.0 (GLOVE) IMPLANT
GLOVE BIOGEL PI INDICATOR 7.0 (GLOVE) ×1
GLOVE OPTIFIT SS 6.5 STRL BRWN (GLOVE) ×1 IMPLANT
GLOVE SKINSENSE NS SZ8.0 LF (GLOVE) ×1
GLOVE SKINSENSE STRL SZ8.0 LF (GLOVE) ×1 IMPLANT
GLOVE SS N UNI LF 8.5 STRL (GLOVE) ×2 IMPLANT
GOWN STRL REIN XL XLG (GOWN DISPOSABLE) ×6 IMPLANT
HAND ALUMI LG (SOFTGOODS) IMPLANT
HAND ALUMI XLG (SOFTGOODS) IMPLANT
KIT ROOM TURNOVER APOR (KITS) ×2 IMPLANT
MANIFOLD NEPTUNE II (INSTRUMENTS) ×2 IMPLANT
NDL HYPO 21X1.5 SAFETY (NEEDLE) ×1 IMPLANT
NEEDLE HYPO 21X1.5 SAFETY (NEEDLE) ×2 IMPLANT
NS IRRIG 1000ML POUR BTL (IV SOLUTION) ×2 IMPLANT
PACK BASIC LIMB (CUSTOM PROCEDURE TRAY) ×2 IMPLANT
PAD ARMBOARD 7.5X6 YLW CONV (MISCELLANEOUS) ×2 IMPLANT
PAD CAST 4YDX4 CTTN HI CHSV (CAST SUPPLIES) IMPLANT
PADDING CAST COTTON 4X4 STRL (CAST SUPPLIES) ×2
SET BASIN LINEN APH (SET/KITS/TRAYS/PACK) ×2 IMPLANT
SPONGE GAUZE 4X4 12PLY (GAUZE/BANDAGES/DRESSINGS) ×2 IMPLANT
STRIP CLOSURE SKIN 1/2X4 (GAUZE/BANDAGES/DRESSINGS) ×2 IMPLANT
SUT ETHILON 3 0 FSL (SUTURE) ×2 IMPLANT
SUT MON AB 2-0 SH 27 (SUTURE) ×2
SUT MON AB 2-0 SH27 (SUTURE) ×1 IMPLANT
SUT PROLENE 3 0 PS 2 (SUTURE) ×1 IMPLANT
SYR CONTROL 10ML LL (SYRINGE) ×2 IMPLANT

## 2012-09-14 NOTE — Anesthesia Procedure Notes (Signed)
Anesthesia Regional Block:  Bier block (IV Regional)  Pre-Anesthetic Checklist: ,, timeout performed, Correct Patient, Correct Site, Correct Laterality, Correct Procedure,, site marked, surgical consent,, at surgeon's request Needles:  Injection technique: Single-shot  Needle Type: Other      Needle Gauge: 20 and 20 G    Additional Needles: Bier block (IV Regional)  Nerve Stimulator or Paresthesia:   Additional Responses:  Pulse checked post tourniquet inflation. IV NSL discontinued post injection. Narrative:  Start time: 09/14/2012 7:52 AM  Performed by: Personally  Resident/CRNA: Santiago Glad Mael Delap,CRNA  Bier block (IV Regional)

## 2012-09-14 NOTE — Transfer of Care (Signed)
Immediate Anesthesia Transfer of Care Note  Patient: [name]  Procedure(s) Performed: Procedure(s): EXCISION METACARPAL MASS (Left)  Patient Location: PACU  Anesthesia Type:Bier block  Level of Consciousness: awake, alert  and oriented  Airway & Oxygen Therapy: Patient Spontanous Breathing  Post-op Assessment: Report given to PACU RN  Post vital signs: Reviewed and stable  Complications: No apparent anesthesia complications

## 2012-09-14 NOTE — Anesthesia Postprocedure Evaluation (Signed)
  Anesthesia Post-op Note  Patient: [name]  Procedure(s) Performed: Procedure(s): EXCISION METACARPAL MASS (Left)  Patient Location: PACU  Anesthesia Type:Bier block  Level of Consciousness: awake, alert  and oriented  Airway and Oxygen Therapy: Patient Spontanous Breathing  Post-op Pain: none  Post-op Assessment: Post-op Vital signs reviewed, Patient's Cardiovascular Status Stable, Respiratory Function Stable, Patent Airway and No signs of Nausea or vomiting  Post-op Vital Signs: Reviewed and stable  Complications: No apparent anesthesia complications

## 2012-09-14 NOTE — H&P (View-Only) (Signed)
History and physical for surgical treatment of the left thumb removal of mass   Chief Complaint  Patient presents with  . Hand Pain    Left thumb pain, no injury. [facility] on 07-19-12.   The patient presents with a history of one month pain over the left thumb for which she went to the emergency room she had an x-ray and it did not show any major abnormalities she complains of a mass over the basilar joint of the left thumb with pain and tenderness some tingling and some catching. She is mainly having soreness but is interfering with her activities of daily living and she would like it removed  Review of systems includes a 14 system review she reported positive findings as redness and watering of the eyes COPD with wheezing and anxiety she reported all other systems as negative  Past Medical History  Diagnosis Date  . Obesity hypoventilation syndrome   . Obstructive sleep apnea   . Osteoarthritis   . Low back pain   . HTN (hypertension)   . Hyperlipidemia   . Hyperlipidemia   . GERD (gastroesophageal reflux disease)   . Depression   . COPD (chronic obstructive pulmonary disease)   . Anxiety   . Gout   . Achilles tendinitis     Past Surgical History  Procedure Date  . Cardiac catheterization 2006  . Tubal ligation     Family History  Problem Relation Age of Onset  . Heart disease Mother   . Thyroid disease Mother   . Heart failure Father   . Diabetes Brother   . Crohn's disease Daughter    History   Social History Narrative  . No narrative on file   Physical Exam(12)  Vital signs:   GENERAL: normal development   CDV: pulses are normal   Skin: normal  Lymph: nodes were not palpable/normal  Psychiatric: awake, alert and oriented  Neuro: normal sensation  Examination of the left thumb reveals a palpable mass in the subcutaneous region overlying the basilar joint of the thumb she also has tenderness and positive grind test in this area she has good  pinch normal extension power at the interphalangeal joint and metacarpophalangeal joint as well as basilar joint. Stability of the joint is normal although you can subluxate the base of the thumb out of the joint. Range of motion is otherwise normal  Impression Mass left thumb  Impression osteoarthritis basilar joint left thumb  The patient has been counseled that she will not get full pain relief but we can remove the mass she will have some pain at the basilar joint of the thumb and this will persist. She is still wanting to get the mass removed  We will arrange for excision of mass left thumb

## 2012-09-14 NOTE — Brief Op Note (Signed)
09/14/2012  8:31 AM  PATIENT:  [name]  62 y.o. female  PRE-OPERATIVE DIAGNOSIS:  mass left thumb  POST-OPERATIVE DIAGNOSIS:  ganglion cyst , left hand  PROCEDURE:  Procedure(s): EXCISION METACARPAL MASS (Left)  Findings 1.5 cm ganglion cyst appeared to be coming from the [facility] joint of the base of the left thumb  Details of procedure the patient was identified in the preop area and the surgical site was confirmed, chart update completed surgical site marked.  The patient was taken to the operating room for a Bier block.  The limb was then prepped and draped sterilely. A longitudinal incision was made over the mass is at the cutaneous tissue was divided bluntly the mass was encountered. The mass was consistent with a ganglion cyst. The thenar branch of the radial sensory nerve was protected the mass was excised. The wound was irrigated. Hemostasis was obtained with electrocautery. The wound was closed with 2-0 Monocryl interrupted sutures and a running 3-0 nylon suture. A radial sensory nerve block was performed. Sterile dressing applied. Tourniquet released. Color the hand normal. Patient taken recovery room.  SURGEON:  Surgeon(s) and Role:    * Carole Civil, MD - Primary  PHYSICIAN ASSISTANT:   ASSISTANTS: none   ANESTHESIA:   regional  EBL:  Total I/O In: 100 [I.V.:100] Out: 0   BLOOD ADMINISTERED:none  DRAINS: none   LOCAL MEDICATIONS USED:  0.25% MARCAINE   10cc plain   SPECIMEN:  Source of Specimen:  left thumb CMC JOINT  DISPOSITION OF SPECIMEN:  PATHOLOGY  COUNTS:  YES  TOURNIQUET:   Total Tourniquet Time Documented: Upper Arm (Left) - 38 minutes Total: Upper Arm (Left) - 38 minutes   DICTATION: .Viviann Spare Dictation  PLAN OF CARE: Discharge to home after PACU  PATIENT DISPOSITION:  PACU - hemodynamically stable.   Delay start of Pharmacological VTE agent (>24hrs) due to surgical blood loss or risk of bleeding: not applicable

## 2012-09-14 NOTE — Anesthesia Preprocedure Evaluation (Signed)
Anesthesia Evaluation  Patient identified by MRN, date of birth, ID band Patient awake    Reviewed: Allergy & Precautions, H&P , NPO status , Patient's Chart, lab work & pertinent test results  Airway Mallampati: II TM Distance: >3 FB Neck ROM: Full    Dental  (+) Edentulous Upper   Pulmonary sleep apnea , COPD   Pulmonary exam normal       Cardiovascular hypertension, Pt. on medications Rhythm:Regular Rate:Normal     Neuro/Psych PSYCHIATRIC DISORDERS Anxiety Depression  Neuromuscular disease    GI/Hepatic Neg liver ROS, GERD-  Medicated,  Endo/Other  Morbid obesity  Renal/GU negative Renal ROS     Musculoskeletal  (+) Arthritis -, Osteoarthritis,    Abdominal (+) + obese,  Abdomen: soft.    Peds  Hematology negative hematology ROS (+)   Anesthesia Other Findings   Reproductive/Obstetrics                           Anesthesia Physical Anesthesia Plan  ASA: III  Anesthesia Plan: Bier Block   Post-op Pain Management:    Induction:   Airway Management Planned: Nasal Cannula  Additional Equipment:   Intra-op Plan:   Post-operative Plan:   Informed Consent: I have reviewed the patients History and Physical, chart, labs and discussed the procedure including the risks, benefits and alternatives for the proposed anesthesia with the patient or authorized representative who has indicated his/her understanding and acceptance.     Plan Discussed with: CRNA  Anesthesia Plan Comments:         Anesthesia Quick Evaluation

## 2012-09-14 NOTE — Interval H&P Note (Signed)
History and Physical Interval Note:  09/14/2012 7:22 AM  [name]  has presented today for surgery, with the diagnosis of mass left thumb  The various methods of treatment have been discussed with the patient and family. After consideration of risks, benefits and other options for treatment, the patient has consented to  Procedure(s): EXCISION METACARPAL MASS (Left) as a surgical intervention .  The patient's history has been reviewed, patient examined, no change in status, stable for surgery.  I have reviewed the patient's chart and labs.  Questions were answered to the patient's satisfaction.     Arther Abbott

## 2012-09-14 NOTE — Op Note (Signed)
09/14/2012  8:31 AM  PATIENT:  [name]  62 y.o. female  PRE-OPERATIVE DIAGNOSIS:  mass left thumb  POST-OPERATIVE DIAGNOSIS:  ganglion cyst , left hand  PROCEDURE:  Procedure(s): EXCISION METACARPAL MASS (Left)  Findings 1.5 cm ganglion cyst appeared to be coming from the [facility] joint of the base of the left thumb  Details of procedure the patient was identified in the preop area and the surgical site was confirmed, chart update completed surgical site marked.  The patient was taken to the operating room for a Bier block.  The limb was then prepped and draped sterilely. A longitudinal incision was made over the mass is at the cutaneous tissue was divided bluntly the mass was encountered. The mass was consistent with a ganglion cyst. The thenar branch of the radial sensory nerve was protected the mass was excised. The wound was irrigated. Hemostasis was obtained with electrocautery. The wound was closed with 2-0 Monocryl interrupted sutures and a running 3-0 nylon suture. A radial sensory nerve block was performed. Sterile dressing applied. Tourniquet released. Color the hand normal. Patient taken recovery room.  SURGEON:  Surgeon(s) and Role:    * Carole Civil, MD - Primary  PHYSICIAN ASSISTANT:   ASSISTANTS: none   ANESTHESIA:   regional  EBL:  Total I/O In: 100 [I.V.:100] Out: 0   BLOOD ADMINISTERED:none  DRAINS: none   LOCAL MEDICATIONS USED:  0.25% MARCAINE   10cc plain   SPECIMEN:  Source of Specimen:  left thumb CMC JOINT  DISPOSITION OF SPECIMEN:  PATHOLOGY  COUNTS:  YES  TOURNIQUET:   Total Tourniquet Time Documented: Upper Arm (Left) - 38 minutes Total: Upper Arm (Left) - 38 minutes   DICTATION: .Viviann Spare Dictation  PLAN OF CARE: Discharge to home after PACU  PATIENT DISPOSITION:  PACU - hemodynamically stable.   Delay start of Pharmacological VTE agent (>24hrs) due to surgical blood loss or risk of bleeding: not applicable

## 2012-09-18 ENCOUNTER — Encounter: Payer: Self-pay | Admitting: Family Medicine

## 2012-09-18 ENCOUNTER — Ambulatory Visit (INDEPENDENT_AMBULATORY_CARE_PROVIDER_SITE_OTHER): Payer: Medicare Other | Admitting: Family Medicine

## 2012-09-18 VITALS — BP 160/82 | HR 78 | Resp 18 | Ht 66.0 in | Wt 281.1 lb

## 2012-09-18 DIAGNOSIS — J4489 Other specified chronic obstructive pulmonary disease: Secondary | ICD-10-CM

## 2012-09-18 DIAGNOSIS — J449 Chronic obstructive pulmonary disease, unspecified: Secondary | ICD-10-CM

## 2012-09-18 DIAGNOSIS — I10 Essential (primary) hypertension: Secondary | ICD-10-CM

## 2012-09-18 DIAGNOSIS — E785 Hyperlipidemia, unspecified: Secondary | ICD-10-CM

## 2012-09-18 MED ORDER — TIOTROPIUM BROMIDE MONOHYDRATE 18 MCG IN CAPS: 18.0000 ug | ORAL_CAPSULE | Freq: Every day | RESPIRATORY_TRACT | Status: DC

## 2012-09-18 MED ORDER — AMLODIPINE BESYLATE 5 MG PO TABS: 5.0000 mg | ORAL_TABLET | Freq: Every day | ORAL | Status: DC

## 2012-09-18 NOTE — Patient Instructions (Signed)
Start the new blood pressure medication norvasc once a day Try to get the spiriva  Check on the food stamps  F/U 1 month for blood pressure

## 2012-09-18 NOTE — Assessment & Plan Note (Signed)
I spent a considerable amount of time discussing with her the importance of blood pressure control and the possibility of heart attack, stroke kidney disease if her blood pressure is elevated. She states that she understood this but does not want to take medications that cause her leg cramps. I reviewed her medication she has been on lisinopril and Maxzide. Will try her on amlodipine, I will avoid beta blockers because she does have COPD

## 2012-09-18 NOTE — Assessment & Plan Note (Signed)
Was doing well per pt on Spiriva, she wants to go back on the medication, I am not sure she can afford it, states she has supplemental insurance now, previous copay $30 She hopefully will f/u with pulmonary Continues to use oxygen with CPAP at bedtime

## 2012-09-18 NOTE — Assessment & Plan Note (Signed)
She is not taking statin drug, though needed, will see if I can get her to be complaint with BP medications first

## 2012-09-18 NOTE — Progress Notes (Signed)
  Subjective:    Patient ID: [name], female    DOB: [birth year], 62 y.o.   MRN: [id number]  HPI  Patient here to followup high blood pressure. She was also found to have ganglion cyst of her left arm and is status post surgical removal. She's only taking her pain medication and aspirin at this time. Previously she states she would not take her blood pressure medications because she cannot afford them now she states every time she takes any blood pressure medicine he gives her leg cramps which is why she is noncompliant. She's been using her CPAP however is not using Spiriva because of cost but it did help her. She is scheduled to followup with her lung doctor.  [facility] is working with pt  Review of Systems  GEN- denies fatigue, fever, weight loss,weakness, recent illness HEENT- denies eye drainage, change in vision, nasal discharge, CVS- denies chest pain, palpitations RESP- denies SOB, cough, wheeze ABD- denies N/V, change in stools, abd pain GU- denies dysuria, hematuria, dribbling, incontinence MSK- denies joint pain, muscle aches, injury Neuro- denies headache, dizziness, syncope, seizure activity      Objective:   Physical Exam GEN- NAD, alert and oriented x3 HEENT- PERRL, EOMI, non injected sclera, pink conjunctiva, MMM, oropharynx clear CVS- RRR, no murmur RESP-CTAB EXT- No edema, left arm wrapped Pulses- Radial, DP- 2+        Assessment & Plan:

## 2012-09-20 ENCOUNTER — Ambulatory Visit (INDEPENDENT_AMBULATORY_CARE_PROVIDER_SITE_OTHER): Payer: Medicare Other | Admitting: Orthopedic Surgery

## 2012-09-20 VITALS — BP 140/78 | Ht 65.5 in | Wt 270.0 lb

## 2012-09-20 DIAGNOSIS — M6740 Ganglion, unspecified site: Secondary | ICD-10-CM

## 2012-09-20 NOTE — Progress Notes (Signed)
Patient ID: [name], female   DOB: Jun 06, 1951, 62 y.o.   MRN: [id number] Chief Complaint  Patient presents with  . Follow-up    post op #1 left thumb DOS 09/14/12    BP 140/78  Ht 5' 5.5" (1.664 m)  Wt 270 lb (122.471 kg)  BMI 44.23 kg/m2  Mass removed from left thumb suture line looks good path report states ganglion cyst  Followup for nurse to remove sutures

## 2012-09-26 ENCOUNTER — Ambulatory Visit (INDEPENDENT_AMBULATORY_CARE_PROVIDER_SITE_OTHER): Payer: Medicare Other | Admitting: Orthopedic Surgery

## 2012-09-26 VITALS — BP 180/100 | Ht 65.5 in | Wt 270.0 lb

## 2012-09-26 DIAGNOSIS — M6740 Ganglion, unspecified site: Secondary | ICD-10-CM

## 2012-09-26 NOTE — Progress Notes (Signed)
Patient ID: [name], female   DOB: [birth year], 62 y.o.   MRN: [id number] Chief Complaint  Patient presents with  . Follow-up    Sutures out    Sutures removed   Steri strips applied   Suture line seemed wet  1 wk wound check

## 2012-10-03 ENCOUNTER — Ambulatory Visit: Payer: Medicare Other | Admitting: Critical Care Medicine

## 2012-10-04 ENCOUNTER — Ambulatory Visit (INDEPENDENT_AMBULATORY_CARE_PROVIDER_SITE_OTHER): Payer: Medicare Other | Admitting: Orthopedic Surgery

## 2012-10-04 VITALS — Ht 65.5 in | Wt 273.0 lb

## 2012-10-04 DIAGNOSIS — M6740 Ganglion, unspecified site: Secondary | ICD-10-CM

## 2012-10-04 MED ORDER — CEPHALEXIN 500 MG PO CAPS: 500.0000 mg | ORAL_CAPSULE | Freq: Two times a day (BID) | ORAL | Status: DC

## 2012-10-04 NOTE — Progress Notes (Signed)
Patient ID: [name], female   DOB: 02-20-1951, 62 y.o.   MRN: [id number] Chief Complaint  Patient presents with  . Follow-up    1 week recheck on left thumb wound. DOS 09-14-12.    The patient's wound is slightly tender slightly swollen no erythema no drainage I think she should on antibiotic salve placed her on Keflex 500 mg twice a day for 10 days with a two-week followup check wound

## 2012-10-09 ENCOUNTER — Encounter: Payer: Self-pay | Admitting: Orthopedic Surgery

## 2012-10-15 ENCOUNTER — Ambulatory Visit: Payer: Medicare Other | Admitting: Family Medicine

## 2012-10-18 ENCOUNTER — Ambulatory Visit (INDEPENDENT_AMBULATORY_CARE_PROVIDER_SITE_OTHER): Payer: Medicare Other | Admitting: Orthopedic Surgery

## 2012-10-18 VITALS — BP 150/88 | Ht 65.5 in | Wt 273.0 lb

## 2012-10-18 DIAGNOSIS — M6740 Ganglion, unspecified site: Secondary | ICD-10-CM

## 2012-10-18 NOTE — Patient Instructions (Signed)
activities as tolerated 

## 2012-10-18 NOTE — Progress Notes (Signed)
Patient ID: [name], female   DOB: 1951/02/10, 62 y.o.   MRN: [id number] Chief Complaint  Patient presents with  . Follow-up    Post op left thumb surgery DOS 09/14/12    Ganglion cyst   BP 150/88  Ht 5' 5.5" (1.664 m)  Wt 273 lb (123.832 kg)  BMI 44.72 kg/m2  Recheck left thumb status post removal of ganglion cyst, we brought her back to check the wound she does complain of some numbness on the radial side of the thumb which is most likely sensory nerve related.  The wound is clean dry and intact  Patient can oppose the fingers and the thumb nicely.  Followup as needed

## 2013-02-25 ENCOUNTER — Ambulatory Visit (INDEPENDENT_AMBULATORY_CARE_PROVIDER_SITE_OTHER): Payer: Medicare Other | Admitting: Family Medicine

## 2013-02-25 ENCOUNTER — Encounter: Payer: Self-pay | Admitting: Family Medicine

## 2013-02-25 VITALS — BP 150/88 | HR 72 | Temp 97.4°F | Resp 18 | Ht 65.5 in | Wt 284.0 lb

## 2013-02-25 DIAGNOSIS — R7302 Impaired glucose tolerance (oral): Secondary | ICD-10-CM

## 2013-02-25 DIAGNOSIS — E785 Hyperlipidemia, unspecified: Secondary | ICD-10-CM

## 2013-02-25 DIAGNOSIS — R609 Edema, unspecified: Secondary | ICD-10-CM

## 2013-02-25 DIAGNOSIS — E669 Obesity, unspecified: Secondary | ICD-10-CM | POA: Insufficient documentation

## 2013-02-25 DIAGNOSIS — J449 Chronic obstructive pulmonary disease, unspecified: Secondary | ICD-10-CM

## 2013-02-25 DIAGNOSIS — B353 Tinea pedis: Secondary | ICD-10-CM

## 2013-02-25 DIAGNOSIS — R7309 Other abnormal glucose: Secondary | ICD-10-CM

## 2013-02-25 DIAGNOSIS — I10 Essential (primary) hypertension: Secondary | ICD-10-CM

## 2013-02-25 LAB — CBC WITH DIFFERENTIAL/PLATELET
Basophils Absolute: 0 10*3/uL (ref 0.0–0.1)
Eosinophils Absolute: 0.4 10*3/uL (ref 0.0–0.7)
Eosinophils Relative: 6 % — ABNORMAL HIGH (ref 0–5)
HCT: 38.9 % (ref 36.0–46.0)
MCH: 28.8 pg (ref 26.0–34.0)
MCV: 89 fL (ref 78.0–100.0)
Monocytes Absolute: 0.5 10*3/uL (ref 0.1–1.0)
Platelets: 261 10*3/uL (ref 150–400)
RDW: 14.7 % (ref 11.5–15.5)

## 2013-02-25 LAB — COMPREHENSIVE METABOLIC PANEL
ALT: 11 U/L (ref 0–35)
AST: 13 U/L (ref 0–37)
Albumin: 3.8 g/dL (ref 3.5–5.2)
Alkaline Phosphatase: 69 U/L (ref 39–117)
Potassium: 4.2 mEq/L (ref 3.5–5.3)
Sodium: 137 mEq/L (ref 135–145)
Total Protein: 7.1 g/dL (ref 6.0–8.3)

## 2013-02-25 LAB — HEMOGLOBIN A1C
Hgb A1c MFr Bld: 5.5 % (ref <5.7)
Mean Plasma Glucose: 111 mg/dL (ref <117)

## 2013-02-25 MED ORDER — CLOTRIMAZOLE-BETAMETHASONE 1-0.05 % EX CREA: TOPICAL_CREAM | Freq: Two times a day (BID) | CUTANEOUS | Status: DC

## 2013-02-25 MED ORDER — HYDROCHLOROTHIAZIDE 25 MG PO TABS: 25.0000 mg | ORAL_TABLET | Freq: Every day | ORAL | Status: DC

## 2013-02-25 NOTE — Patient Instructions (Addendum)
Start the new blood pressure once a day in the morning  Use the cream on your feet twice a day We will call with lab results F/U 4 weeks for blood pressure

## 2013-02-25 NOTE — Assessment & Plan Note (Signed)
Discussed proper foot care. Start her on Lotrisone

## 2013-02-25 NOTE — Progress Notes (Signed)
  Subjective:    Patient ID: [name], female    DOB: 07-Mar-1951, 62 y.o.   MRN: [id number]  HPI  Patient here to follow chronic medical problems. She's been followed by [facility]. She never started the amlodipine prescribed at her last visit for blood pressure but she states she does not need it. She denies any chest pain, shortness of breath. She does get some leg swelling throughout the day. She is due for fasting labs but is not fasting today. She's no longer using any inhalers has an appointment to see her pulmonologist next month. She's still using her CPAP machine  Review of Systems   GEN- denies fatigue, fever, weight loss,weakness, recent illness HEENT- denies eye drainage, change in vision, nasal discharge, CVS- denies chest pain, palpitations RESP- denies SOB, cough, wheeze ABD- denies N/V, change in stools, abd pain GU- denies dysuria, hematuria, dribbling, incontinence MSK- denies joint pain, muscle aches, injury Neuro- denies headache, dizziness, syncope, seizure activity      Objective:   Physical Exam GEN- NAD, alert and oriented x3, clothes are stained with food and dirt HEENT- PERRL, EOMI, non injected sclera, pink conjunctiva, MMM, oropharynx clear Neck- Supple, no JVD, no bruit CVS- RRR, no murmur RESP-CTAB ABD-NABS,soft,NT,ND, no hepatomegaly EXT- +pedal edema Skin- bilat feet hyperpigementation with peeling skin , maceration between toes bilat Pulses- Radial, DP- 2+        Assessment & Plan:

## 2013-02-25 NOTE — Assessment & Plan Note (Signed)
Recheck A1c 

## 2013-02-25 NOTE — Assessment & Plan Note (Signed)
Unfortunately I cannot get her to come in on a regular basis to have labs done. I will obtain a direct LDL today she does have known hyperlipidemia but is not on any medications because of noncompliance

## 2013-02-25 NOTE — Assessment & Plan Note (Signed)
Mild peripheral edema. She has had on and off for some time now. We'll try to treat with hydrochlorothiazide she was on Lasix in the past per cardiology note

## 2013-02-25 NOTE — Assessment & Plan Note (Signed)
Blood pressure elevated today. I will start her on hydrochlorothiazide this will also help with some of her leg swelling

## 2013-02-25 NOTE — Assessment & Plan Note (Signed)
She declines any medications will have her followup with pulmonary

## 2013-02-26 MED ORDER — SIMVASTATIN 10 MG PO TABS: 10.0000 mg | ORAL_TABLET | Freq: Every day | ORAL | Status: DC

## 2013-02-26 NOTE — Addendum Note (Signed)
Addended by: Vic Blackbird F on: 02/26/2013 10:18 PM   Modules accepted: Orders

## 2013-03-04 ENCOUNTER — Telehealth: Payer: Self-pay | Admitting: Family Medicine

## 2013-03-04 MED ORDER — POTASSIUM CHLORIDE ER 10 MEQ PO TBCR: 10.0000 meq | EXTENDED_RELEASE_TABLET | Freq: Every day | ORAL | Status: DC

## 2013-03-04 NOTE — Telephone Encounter (Signed)
Please have her try taking the HCTZ in the morning with potassium pill that I have sent, this should stop the cramping

## 2013-03-04 NOTE — Telephone Encounter (Signed)
No answer at this time

## 2013-03-05 NOTE — Telephone Encounter (Signed)
No answer at this time

## 2013-03-06 NOTE — Telephone Encounter (Signed)
Pt aware of message

## 2013-03-20 ENCOUNTER — Encounter: Payer: Self-pay | Admitting: Critical Care Medicine

## 2013-03-20 ENCOUNTER — Ambulatory Visit (INDEPENDENT_AMBULATORY_CARE_PROVIDER_SITE_OTHER): Payer: Medicare Other | Admitting: Critical Care Medicine

## 2013-03-20 VITALS — BP 138/90 | HR 87 | Temp 97.6°F | Ht 65.0 in | Wt 278.8 lb

## 2013-03-20 DIAGNOSIS — J438 Other emphysema: Secondary | ICD-10-CM

## 2013-03-20 DIAGNOSIS — J449 Chronic obstructive pulmonary disease, unspecified: Secondary | ICD-10-CM

## 2013-03-20 DIAGNOSIS — J439 Emphysema, unspecified: Secondary | ICD-10-CM

## 2013-03-20 MED ORDER — TIOTROPIUM BROMIDE MONOHYDRATE 18 MCG IN CAPS: 18.0000 ug | ORAL_CAPSULE | Freq: Every day | RESPIRATORY_TRACT | Status: DC

## 2013-03-20 NOTE — Assessment & Plan Note (Signed)
Gold stage B. COPD with prior FEV1 58% of predicted Exertional hypoxemia documented on current exam Need to resume bronchodilator therapy on schedule basis Plan Start spiriva Obtain lung function tests at [facility] to update lung function Stay on cpap  and oxygen 1L QHS

## 2013-03-20 NOTE — Patient Instructions (Addendum)
Start spiriva Obtain lung function tests at [facility] on cpap See patient care coordinator for pt assistance program for spiriva Return 4 months Remember flu vaccine this fall

## 2013-03-20 NOTE — Progress Notes (Signed)
  Subjective:    Patient ID: [name], female    DOB: [birth year], 62 y.o.   MRN: [id number]  HPI  61 -year-old [place] American female with a history of obstructive sleep apnea, chronic obstructive lung disease, asthmatic bronchitis.   03/20/2013 Chief Complaint  Patient presents with  . Follow-up    last seen 2012.  Has good days and bad days - has wheezing, chest tightness, nonprod cough, and tires easily.    Not seen since 2012. Has good and bad days.  Dyspnea with exertion.  Pt has gotten off all inhalers.   No real mucus, dry cough.  Gets tired easily. Occ chest pain PFTs 2012 FeV1 63% Gold B.  Pt on cpap 12 with 1L oxygen QHS  Review of Systems 11 pt ros neg except as above in hpi    Objective:   Physical Exam  Filed Vitals:   03/20/13 1029  BP: 138/90  Pulse: 87  Temp: 97.6 F (36.4 C)  TempSrc: Oral  Height: 5\' 5"  (1.651 m)  Weight: 278 lb 12.8 oz (126.463 kg)  SpO2: 96%    Gen: Pleasant, well-nourished, in no distress,  normal affect  ENT: No lesions,  mouth clear,  oropharynx clear, no postnasal drip  Neck: No JVD, no TMG, no carotid bruits  Lungs: No use of accessory muscles, no dullness to percussion, distant breath sounds without wheezes or rhonchi  Cardiovascular: RRR, heart sounds normal, no murmur or gallops, no peripheral edema  Abdomen: soft and NT, no HSM,  BS normal  Musculoskeletal: No deformities, no cyanosis or clubbing  Neuro: alert, non focal  Skin: Warm, no lesions or rashes  No results found.       Assessment & Plan:   COPD with emphysema gold stage C Gold stage B. COPD with prior FEV1 58% of predicted Exertional hypoxemia documented on current exam Need to resume bronchodilator therapy on schedule basis Plan Start spiriva Obtain lung function tests at [facility] to update lung function Stay on cpap  and oxygen 1L QHS   Updated Medication List Outpatient Encounter Prescriptions as of 03/20/2013  Medication Sig  Dispense Refill  . aspirin 325 MG tablet Take 325 mg by mouth as needed for pain.      . clotrimazole-betamethasone (LOTRISONE) cream Apply topically 2 (two) times daily. To feet for 2 weeks  45 g  0  . hydrochlorothiazide (HYDRODIURIL) 25 MG tablet Take 1 tablet (25 mg total) by mouth daily.  30 tablet  6  . NON FORMULARY CPAP and 1 lpm oxygen qhs      . potassium chloride (K-DUR) 10 MEQ tablet Take 1 tablet (10 mEq total) by mouth daily. With HCTZ  30 tablet  3  . tiotropium (SPIRIVA HANDIHALER) 18 MCG inhalation capsule Place 1 capsule (18 mcg total) into inhaler and inhale daily.  30 capsule  12  . tiotropium (SPIRIVA) 18 MCG inhalation capsule Place 1 capsule (18 mcg total) into inhaler and inhale daily.  20 capsule  0  . [DISCONTINUED] simvastatin (ZOCOR) 10 MG tablet Take 1 tablet (10 mg total) by mouth at bedtime.  30 tablet  3   No facility-administered encounter medications on file as of 03/20/2013.

## 2013-03-26 ENCOUNTER — Ambulatory Visit: Payer: Medicare Other | Admitting: Family Medicine

## 2013-03-27 ENCOUNTER — Ambulatory Visit (HOSPITAL_COMMUNITY)
Admission: RE | Admit: 2013-03-27 | Discharge: 2013-03-27 | Disposition: A | Discharge status: Home / Self Care | Payer: Medicare Other | Source: Ambulatory Visit | Attending: Critical Care Medicine | Admitting: Critical Care Medicine

## 2013-03-27 DIAGNOSIS — R0609 Other forms of dyspnea: Secondary | ICD-10-CM | POA: Insufficient documentation

## 2013-03-27 DIAGNOSIS — R0902 Hypoxemia: Secondary | ICD-10-CM | POA: Insufficient documentation

## 2013-03-27 DIAGNOSIS — J439 Emphysema, unspecified: Secondary | ICD-10-CM

## 2013-03-27 DIAGNOSIS — R0989 Other specified symptoms and signs involving the circulatory and respiratory systems: Secondary | ICD-10-CM | POA: Insufficient documentation

## 2013-03-27 MED ORDER — ALBUTEROL SULFATE (5 MG/ML) 0.5% IN NEBU
2.5000 mg | INHALATION_SOLUTION | Freq: Once | RESPIRATORY_TRACT | Status: AC
  Administered 2013-03-27: 2.5 mg via RESPIRATORY_TRACT

## 2013-04-02 ENCOUNTER — Ambulatory Visit (INDEPENDENT_AMBULATORY_CARE_PROVIDER_SITE_OTHER): Payer: Medicare Other | Admitting: Family Medicine

## 2013-04-02 VITALS — BP 138/90 | HR 80 | Temp 97.9°F | Resp 18 | Wt 278.0 lb

## 2013-04-02 DIAGNOSIS — J439 Emphysema, unspecified: Secondary | ICD-10-CM

## 2013-04-02 DIAGNOSIS — J438 Other emphysema: Secondary | ICD-10-CM

## 2013-04-02 DIAGNOSIS — I10 Essential (primary) hypertension: Secondary | ICD-10-CM

## 2013-04-02 NOTE — Patient Instructions (Addendum)
Continue current medications We will call [facility] about your spiriva and cholesterol pill F/U 4 months

## 2013-04-03 ENCOUNTER — Encounter: Payer: Self-pay | Admitting: Family Medicine

## 2013-04-03 NOTE — Assessment & Plan Note (Signed)
Improved BP, continue meds

## 2013-04-03 NOTE — Progress Notes (Signed)
  Subjective:    Patient ID: [name], female    DOB: February 17, 1951, 62 y.o.   MRN: [id number]  HPI Pt here to f/u HTN, last visit started on HCTZ, potassium called in due to leg cramps, no resolved Seen by pulmonary note reviewed, placed on Spiriva but unable to afford the copay of $45 a month   Review of Systems   GEN- denies fatigue, fever, weight loss,weakness, recent illness HEENT- denies eye drainage, change in vision, nasal discharge, CVS- denies chest pain, palpitations RESP- denies SOB, cough, wheeze Neuro- denies headache, dizziness, syncope, seizure activity      Objective:   Physical Exam  GEN- NAD, alert and oriented x3 CVS- RRR, no murmur RESP-CTAB EXT- trace pedal edema Pulses- Radial 2+       Assessment & Plan:

## 2013-04-03 NOTE — Assessment & Plan Note (Addendum)
Given samples of spiriva from office Will contact [facility] to see how they can assist pt, she was given some type of form that needs to be completed to help with assistance with Spiriva

## 2013-04-08 NOTE — Procedures (Signed)
NAME:  [name], [name]             ACCOUNT NO.:  [id number]  MEDICAL RECORD NO.:  [id number]  LOCATION:                            FACILITY:  [facility]  PHYSICIAN:  Milferd Ansell L. Luan Pulling, M.D.DATE OF BIRTH:  [birth year]  DATE OF PROCEDURE: DATE OF DISCHARGE:                           PULMONARY FUNCTION TEST   REASON FOR PULMONARY FUNCTION TESTING:  COPD.  1. Spirometry shows a moderate ventilatory defect without definite     airflow obstruction on spirometry. 2. Lung volumes show a mild reduction of total lung capacity. 3. DLCO is moderately reduced. 4. Airway resistance is normal. 5. There is significant bronchodilator improvement. 6. She may have something like an interstitial lung disease rather     than COPD and clinical correlation is suggested.     [name] L. Luan Pulling, M.D.     ELH/MEDQ  D:  04/04/2013  T:  04/05/2013  Job:  [id number]  cc:   Burnett Harry. Joya Gaskins, MD, FCCP [address] [place] [address]

## 2013-04-10 LAB — PULMONARY FUNCTION TEST

## 2013-04-27 ENCOUNTER — Emergency Department (HOSPITAL_COMMUNITY): Payer: Medicare Other

## 2013-04-27 ENCOUNTER — Encounter (HOSPITAL_COMMUNITY): Payer: Self-pay

## 2013-04-27 ENCOUNTER — Emergency Department (HOSPITAL_COMMUNITY)
Admission: EM | Admit: 2013-04-27 | Discharge: 2013-04-27 | Disposition: A | Discharge status: Home / Self Care | Payer: Medicare Other | Attending: Emergency Medicine | Admitting: Emergency Medicine

## 2013-04-27 DIAGNOSIS — Z8639 Personal history of other endocrine, nutritional and metabolic disease: Secondary | ICD-10-CM | POA: Insufficient documentation

## 2013-04-27 DIAGNOSIS — Z9861 Coronary angioplasty status: Secondary | ICD-10-CM | POA: Insufficient documentation

## 2013-04-27 DIAGNOSIS — M25572 Pain in left ankle and joints of left foot: Secondary | ICD-10-CM

## 2013-04-27 DIAGNOSIS — Z8659 Personal history of other mental and behavioral disorders: Secondary | ICD-10-CM | POA: Insufficient documentation

## 2013-04-27 DIAGNOSIS — J4489 Other specified chronic obstructive pulmonary disease: Secondary | ICD-10-CM | POA: Insufficient documentation

## 2013-04-27 DIAGNOSIS — M25476 Effusion, unspecified foot: Secondary | ICD-10-CM | POA: Insufficient documentation

## 2013-04-27 DIAGNOSIS — Z862 Personal history of diseases of the blood and blood-forming organs and certain disorders involving the immune mechanism: Secondary | ICD-10-CM | POA: Insufficient documentation

## 2013-04-27 DIAGNOSIS — J449 Chronic obstructive pulmonary disease, unspecified: Secondary | ICD-10-CM | POA: Insufficient documentation

## 2013-04-27 DIAGNOSIS — Z8719 Personal history of other diseases of the digestive system: Secondary | ICD-10-CM | POA: Insufficient documentation

## 2013-04-27 DIAGNOSIS — Z87891 Personal history of nicotine dependence: Secondary | ICD-10-CM | POA: Insufficient documentation

## 2013-04-27 DIAGNOSIS — M25473 Effusion, unspecified ankle: Secondary | ICD-10-CM | POA: Insufficient documentation

## 2013-04-27 DIAGNOSIS — Z7982 Long term (current) use of aspirin: Secondary | ICD-10-CM | POA: Insufficient documentation

## 2013-04-27 DIAGNOSIS — E662 Morbid (severe) obesity with alveolar hypoventilation: Secondary | ICD-10-CM | POA: Insufficient documentation

## 2013-04-27 DIAGNOSIS — M109 Gout, unspecified: Secondary | ICD-10-CM | POA: Insufficient documentation

## 2013-04-27 DIAGNOSIS — I10 Essential (primary) hypertension: Secondary | ICD-10-CM | POA: Insufficient documentation

## 2013-04-27 DIAGNOSIS — Z79899 Other long term (current) drug therapy: Secondary | ICD-10-CM | POA: Insufficient documentation

## 2013-04-27 DIAGNOSIS — M25579 Pain in unspecified ankle and joints of unspecified foot: Secondary | ICD-10-CM | POA: Insufficient documentation

## 2013-04-27 DIAGNOSIS — Z8669 Personal history of other diseases of the nervous system and sense organs: Secondary | ICD-10-CM | POA: Insufficient documentation

## 2013-04-27 MED ORDER — IBUPROFEN 800 MG PO TABS
800.0000 mg | ORAL_TABLET | Freq: Once | ORAL | Status: AC
  Administered 2013-04-27: 800 mg via ORAL
  Filled 2013-04-27: qty 1

## 2013-04-27 MED ORDER — HYDROCODONE-ACETAMINOPHEN 5-325 MG PO TABS: 1.0000 | ORAL_TABLET | ORAL | Status: DC | PRN

## 2013-04-27 MED ORDER — HYDROCODONE-ACETAMINOPHEN 5-325 MG PO TABS
1.0000 | ORAL_TABLET | Freq: Once | ORAL | Status: AC
  Administered 2013-04-27: 1 via ORAL
  Filled 2013-04-27: qty 1

## 2013-04-27 MED ORDER — IBUPROFEN 600 MG PO TABS: 600.0000 mg | ORAL_TABLET | Freq: Three times a day (TID) | ORAL | Status: DC | PRN

## 2013-04-27 NOTE — ED Notes (Signed)
I believe that I have tendonitis in my left ankle I have had it before and it feels the same per pt.

## 2013-04-30 ENCOUNTER — Encounter: Payer: Self-pay | Admitting: Family Medicine

## 2013-04-30 ENCOUNTER — Ambulatory Visit (INDEPENDENT_AMBULATORY_CARE_PROVIDER_SITE_OTHER): Payer: Medicare Other | Admitting: Family Medicine

## 2013-04-30 VITALS — BP 130/70 | HR 78 | Temp 97.5°F | Resp 18 | Wt 278.0 lb

## 2013-04-30 DIAGNOSIS — Z23 Encounter for immunization: Secondary | ICD-10-CM

## 2013-04-30 DIAGNOSIS — M109 Gout, unspecified: Secondary | ICD-10-CM

## 2013-04-30 DIAGNOSIS — M25572 Pain in left ankle and joints of left foot: Secondary | ICD-10-CM

## 2013-04-30 DIAGNOSIS — M25579 Pain in unspecified ankle and joints of unspecified foot: Secondary | ICD-10-CM

## 2013-04-30 MED ORDER — COLCHICINE 0.6 MG PO TABS: 0.6000 mg | ORAL_TABLET | Freq: Every day | ORAL | Status: DC

## 2013-04-30 NOTE — ED Provider Notes (Signed)
CSN: [id number]     Arrival date & time 04/27/13  2027 History   First MD Initiated Contact with Patient 04/27/13 2046     Chief Complaint  Patient presents with  . Ankle Pain   (Consider location/radiation/quality/duration/timing/severity/associated sxs/prior Treatment) Patient is a 62 y.o. female presenting with ankle pain. The history is provided by the patient.  Ankle Pain Location:  Ankle Injury: no   Ankle location:  L ankle Pain details:    Quality:  Aching   Radiates to:  Does not radiate   Severity:  Moderate   Onset quality:  Gradual   Duration:  2 days   Timing:  Constant   Progression:  Worsening Chronicity:  New Dislocation: no   Prior injury to area:  No Relieved by:  None tried Worsened by:  Activity and bearing weight Ineffective treatments: aspirin. Associated symptoms: swelling   Associated symptoms: no back pain, no fever, no numbness and no tingling     Past Medical History  Diagnosis Date  . Obesity hypoventilation syndrome   . Obstructive sleep apnea   . Osteoarthritis   . Low back pain   . HTN (hypertension)   . Hyperlipidemia   . GERD (gastroesophageal reflux disease)   . Depression   . COPD (chronic obstructive pulmonary disease)   . Anxiety   . Gout   . Achilles tendinitis    Past Surgical History  Procedure Laterality Date  . Cardiac catheterization  2006  . Tubal ligation     Family History  Problem Relation Age of Onset  . Heart disease Mother   . Thyroid disease Mother   . Heart failure Father   . Diabetes Brother   . Crohn's disease Daughter    History  Substance Use Topics  . Smoking status: Former Smoker -- 1.50 packs/day for 30 years    Types: Cigarettes    Quit date: 03/25/2005  . Smokeless tobacco: Never Used  . Alcohol Use: No   OB History   Grav Para Term Preterm Abortions TAB SAB Ect Mult Living                 Review of Systems  Constitutional: Negative for fever.  Musculoskeletal: Positive for joint  swelling and arthralgias. Negative for back pain.  Skin: Negative for wound.  Neurological: Negative for weakness and numbness.    Allergies  Review of patient's allergies indicates no known allergies.  Home Medications   Current Outpatient Rx  Name  Route  Sig  Dispense  Refill  . aspirin 325 MG tablet   Oral   Take 325 mg by mouth as needed for pain.         . clotrimazole-betamethasone (LOTRISONE) cream   Topical   Apply topically 2 (two) times daily. To feet for 2 weeks   45 g   0   . colchicine 0.6 MG tablet   Oral   Take 1 tablet (0.6 mg total) by mouth daily.   30 tablet   0   . hydrochlorothiazide (HYDRODIURIL) 25 MG tablet   Oral   Take 1 tablet (25 mg total) by mouth daily.   30 tablet   6   . HYDROcodone-acetaminophen (NORCO/VICODIN) 5-325 MG per tablet   Oral   Take 1 tablet by mouth every 4 (four) hours as needed for pain.   20 tablet   0   . ibuprofen (ADVIL,MOTRIN) 600 MG tablet   Oral   Take 1 tablet (600 mg total) by  mouth every 8 (eight) hours as needed for pain.   30 tablet   0   . NON FORMULARY      CPAP and 1 lpm oxygen qhs         . potassium chloride (K-DUR) 10 MEQ tablet   Oral   Take 1 tablet (10 mEq total) by mouth daily. With HCTZ   30 tablet   3   . tiotropium (SPIRIVA HANDIHALER) 18 MCG inhalation capsule   Inhalation   Place 1 capsule (18 mcg total) into inhaler and inhale daily.   30 capsule   12   . tiotropium (SPIRIVA) 18 MCG inhalation capsule   Inhalation   Place 1 capsule (18 mcg total) into inhaler and inhale daily.   20 capsule   0    BP 128/54  Pulse 70  Temp(Src) 98.5 F (36.9 C)  Resp 20  Ht 5' 5.5" (1.664 m)  Wt 270 lb (122.471 kg)  BMI 44.23 kg/m2  SpO2 100% Physical Exam  Nursing note and vitals reviewed. Constitutional: She appears well-developed and well-nourished.  HENT:  Head: Normocephalic.  Cardiovascular: Normal rate and intact distal pulses.  Exam reveals no decreased pulses.    Pulses:      Dorsalis pedis pulses are 2+ on the right side, and 2+ on the left side.       Posterior tibial pulses are 2+ on the right side, and 2+ on the left side.  Musculoskeletal: She exhibits edema and tenderness.       Left ankle: She exhibits swelling. She exhibits normal range of motion and normal pulse. Tenderness. Lateral malleolus tenderness found. No proximal fibula tenderness found. Achilles tendon normal.  Neurological: She is alert. No sensory deficit.  Skin: Skin is warm, dry and intact.    ED Course  Procedures (including critical care time) Labs Review Labs Reviewed - No data to display Imaging Review No results found.  MDM   1. Ankle joint pain, left    Suspect strain of joint, doubt gout flair given no erythema or increased warmth.  However,  She was prescribed ibuprofen in the event this is an early flare.  Hydrocodone.  Encouraged elevation,  Heat, asp provided which helped her pain.  F/u with pcp if sx not improving over the next week.  Patients labs and/or radiological studies were viewed and considered during the medical decision making and disposition process.     Evalee Jefferson, PA-C 04/30/13 2355

## 2013-04-30 NOTE — Progress Notes (Signed)
  Subjective:    Patient ID: [name], female    DOB: 10-28-1950, 62 y.o.   MRN: [id number]  HPI  Patient here to followup ER visit for left ankle pain. She woke up Saturday with severe ankle pain and swelling. She went to the emergency room x-ray was done which shows soft tissue swelling ligaments were intact and she has not had any injury. She was given anti-inflammatories as well as hydrocodone for pain which is helps some. She tried soaking in Epsom salts however this did not help. She does have a history of gout  Review of Systems  GEN- denies fatigue, fever, weight loss,weakness, recent illness MSK- + joint pain, muscle aches, injury        Objective:   Physical Exam  GEN-NAD,alert and oriented x 3 MSK- Left ankle, swelling at ankle joint, TTP over lateral malleous and posterior aspect of foot, Good ROM, ligaments in tact, Right ankle - trace pedal edema, good ROM      Assessment & Plan:

## 2013-04-30 NOTE — Assessment & Plan Note (Signed)
Based on symptoms I am concerned for gout, she is on HCTZ, which she has done very well with for BP and peripheral edema I will check Uric acid I am going to ahead and start colchicine which she has done well with in the past

## 2013-04-30 NOTE — Assessment & Plan Note (Signed)
Reviewed ER records and xray I will treat for gout based on history Continue hydrocodone

## 2013-04-30 NOTE — Patient Instructions (Addendum)
Flu shot given Uric acid pill  Colchicine - take 2 tablets x 1 dose today, then 1 tablet twice a day for gout for the next week  F/U as previous

## 2013-05-01 LAB — URIC ACID: Uric Acid, Serum: 11.7 mg/dL — ABNORMAL HIGH (ref 2.4–7.0)

## 2013-05-01 MED ORDER — LISINOPRIL 10 MG PO TABS: 10.0000 mg | ORAL_TABLET | Freq: Every day | ORAL | Status: DC

## 2013-05-01 NOTE — Addendum Note (Signed)
Addended by: Vic Blackbird F on: 05/01/2013 09:00 AM   Modules accepted: Orders

## 2013-05-03 NOTE — ED Provider Notes (Signed)
Medical screening examination/treatment/procedure(s) were performed by non-physician practitioner and as supervising physician I was immediately available for consultation/collaboration.  Nat Christen, MD 05/03/13 2237

## 2013-05-10 ENCOUNTER — Telehealth: Payer: Self-pay | Admitting: Family Medicine

## 2013-05-10 NOTE — Telephone Encounter (Signed)
She wants something called in for her cold

## 2013-06-03 ENCOUNTER — Emergency Department (HOSPITAL_COMMUNITY)
Admission: EM | Admit: 2013-06-03 | Discharge: 2013-06-03 | Disposition: A | Discharge status: Home / Self Care | Payer: Medicare Other | Attending: Emergency Medicine | Admitting: Emergency Medicine

## 2013-06-03 ENCOUNTER — Encounter (HOSPITAL_COMMUNITY): Payer: Self-pay | Admitting: Emergency Medicine

## 2013-06-03 DIAGNOSIS — M542 Cervicalgia: Secondary | ICD-10-CM | POA: Insufficient documentation

## 2013-06-03 DIAGNOSIS — R599 Enlarged lymph nodes, unspecified: Secondary | ICD-10-CM | POA: Insufficient documentation

## 2013-06-03 DIAGNOSIS — Z792 Long term (current) use of antibiotics: Secondary | ICD-10-CM | POA: Insufficient documentation

## 2013-06-03 DIAGNOSIS — Z8669 Personal history of other diseases of the nervous system and sense organs: Secondary | ICD-10-CM | POA: Insufficient documentation

## 2013-06-03 DIAGNOSIS — J4489 Other specified chronic obstructive pulmonary disease: Secondary | ICD-10-CM | POA: Insufficient documentation

## 2013-06-03 DIAGNOSIS — J449 Chronic obstructive pulmonary disease, unspecified: Secondary | ICD-10-CM | POA: Insufficient documentation

## 2013-06-03 DIAGNOSIS — R590 Localized enlarged lymph nodes: Secondary | ICD-10-CM

## 2013-06-03 DIAGNOSIS — Z8719 Personal history of other diseases of the digestive system: Secondary | ICD-10-CM | POA: Insufficient documentation

## 2013-06-03 DIAGNOSIS — Z9861 Coronary angioplasty status: Secondary | ICD-10-CM | POA: Insufficient documentation

## 2013-06-03 DIAGNOSIS — Z87891 Personal history of nicotine dependence: Secondary | ICD-10-CM | POA: Insufficient documentation

## 2013-06-03 DIAGNOSIS — M1990 Unspecified osteoarthritis, unspecified site: Secondary | ICD-10-CM | POA: Insufficient documentation

## 2013-06-03 DIAGNOSIS — I10 Essential (primary) hypertension: Secondary | ICD-10-CM | POA: Insufficient documentation

## 2013-06-03 DIAGNOSIS — F3289 Other specified depressive episodes: Secondary | ICD-10-CM | POA: Insufficient documentation

## 2013-06-03 DIAGNOSIS — K029 Dental caries, unspecified: Secondary | ICD-10-CM | POA: Insufficient documentation

## 2013-06-03 DIAGNOSIS — E662 Morbid (severe) obesity with alveolar hypoventilation: Secondary | ICD-10-CM | POA: Insufficient documentation

## 2013-06-03 DIAGNOSIS — F329 Major depressive disorder, single episode, unspecified: Secondary | ICD-10-CM | POA: Insufficient documentation

## 2013-06-03 DIAGNOSIS — IMO0002 Reserved for concepts with insufficient information to code with codable children: Secondary | ICD-10-CM | POA: Insufficient documentation

## 2013-06-03 DIAGNOSIS — Z79899 Other long term (current) drug therapy: Secondary | ICD-10-CM | POA: Insufficient documentation

## 2013-06-03 DIAGNOSIS — M109 Gout, unspecified: Secondary | ICD-10-CM | POA: Insufficient documentation

## 2013-06-03 DIAGNOSIS — F411 Generalized anxiety disorder: Secondary | ICD-10-CM | POA: Insufficient documentation

## 2013-06-03 LAB — BASIC METABOLIC PANEL
BUN: 11 mg/dL (ref 6–23)
Calcium: 9.4 mg/dL (ref 8.4–10.5)
Creatinine, Ser: 0.85 mg/dL (ref 0.50–1.10)
GFR calc Af Amer: 83 mL/min — ABNORMAL LOW (ref >90)
GFR calc non Af Amer: 71 mL/min — ABNORMAL LOW (ref >90)
Glucose, Bld: 80 mg/dL (ref 70–99)
Potassium: 3.8 mEq/L (ref 3.5–5.1)

## 2013-06-03 LAB — CBC WITH DIFFERENTIAL/PLATELET
Eosinophils Absolute: 0.5 10*3/uL (ref 0.0–0.7)
Eosinophils Relative: 6 % — ABNORMAL HIGH (ref 0–5)
HCT: 37 % (ref 36.0–46.0)
Hemoglobin: 12.2 g/dL (ref 12.0–15.0)
Lymphs Abs: 2.6 10*3/uL (ref 0.7–4.0)
MCH: 29.5 pg (ref 26.0–34.0)
MCV: 89.6 fL (ref 78.0–100.0)
Monocytes Absolute: 0.7 10*3/uL (ref 0.1–1.0)
Monocytes Relative: 8 % (ref 3–12)
RBC: 4.13 MIL/uL (ref 3.87–5.11)
RDW: 14.4 % (ref 11.5–15.5)

## 2013-06-03 MED ORDER — CLINDAMYCIN HCL 300 MG PO CAPS: 300.0000 mg | ORAL_CAPSULE | Freq: Four times a day (QID) | ORAL | Status: DC

## 2013-06-03 MED ORDER — CLINDAMYCIN HCL 150 MG PO CAPS
300.0000 mg | ORAL_CAPSULE | Freq: Once | ORAL | Status: AC
  Administered 2013-06-03: 300 mg via ORAL
  Filled 2013-06-03: qty 2

## 2013-06-03 NOTE — ED Notes (Signed)
Swollen lymphnode of rt side of neck, noticed it this morming, Tender to palp

## 2013-06-05 NOTE — ED Provider Notes (Signed)
CSN: [id number]     Arrival date & time 06/03/13  1740 History   First MD Initiated Contact with Patient 06/03/13 1748     Chief Complaint  Patient presents with  . Lymphadenopathy   (Consider location/radiation/quality/duration/timing/severity/associated sxs/prior Treatment) HPI Comments: [name] is a 62 y.o. female who presents to the Emergency Department complaining of swollen area to the right side of her neck.  States the area is tender to touch.  She also c/o a tender area to her right lower gums, but denies dental pain.    Nothing makes the symptoms better.  She denies difficulty swallowing, fever, chills, sore throat, or weight loss.   The history is provided by the patient.    Past Medical History  Diagnosis Date  . Obesity hypoventilation syndrome   . Obstructive sleep apnea   . Osteoarthritis   . Low back pain   . HTN (hypertension)   . Hyperlipidemia   . GERD (gastroesophageal reflux disease)   . Depression   . COPD (chronic obstructive pulmonary disease)   . Anxiety   . Gout   . Achilles tendinitis    Past Surgical History  Procedure Laterality Date  . Tubal ligation    . Cardiac catheterization  2006   Family History  Problem Relation Age of Onset  . Heart disease Mother   . Thyroid disease Mother   . Heart failure Father   . Diabetes Brother   . Crohn's disease Daughter    History  Substance Use Topics  . Smoking status: Former Smoker -- 1.50 packs/day for 30 years    Types: Cigarettes    Quit date: 03/25/2005  . Smokeless tobacco: Never Used  . Alcohol Use: No   OB History   Grav Para Term Preterm Abortions TAB SAB Ect Mult Living                 Review of Systems  Constitutional: Negative for fever, chills, activity change and appetite change.  HENT: Negative for congestion, dental problem, ear pain, facial swelling, sore throat, trouble swallowing and voice change.   Eyes: Negative for pain and visual disturbance.  Respiratory:  Negative for shortness of breath.   Musculoskeletal: Positive for neck pain. Negative for neck stiffness.  Neurological: Negative for dizziness, facial asymmetry and headaches.  Hematological: Positive for adenopathy.  All other systems reviewed and are negative.    Allergies  Review of patient's allergies indicates no known allergies.  Home Medications   Current Outpatient Rx  Name  Route  Sig  Dispense  Refill  . aspirin 325 MG tablet   Oral   Take 325 mg by mouth as needed for pain.         . clotrimazole-betamethasone (LOTRISONE) cream   Topical   Apply 1 application topically 2 (two) times daily as needed. To be applied to the feet as needed         . colchicine 0.6 MG tablet   Oral   Take 1 tablet (0.6 mg total) by mouth daily.   30 tablet   0   . fluticasone (CUTIVATE) 0.05 % cream   Topical   Apply 1 application topically daily.          Marland Kitchen HYDROcodone-acetaminophen (NORCO/VICODIN) 5-325 MG per tablet   Oral   Take 1 tablet by mouth every 4 (four) hours as needed for pain.   20 tablet   0   . ibuprofen (ADVIL,MOTRIN) 600 MG tablet   Oral  Take 1 tablet (600 mg total) by mouth every 8 (eight) hours as needed for pain.   30 tablet   0   . lisinopril (PRINIVIL,ZESTRIL) 10 MG tablet   Oral   Take 1 tablet (10 mg total) by mouth daily.   30 tablet   3   . NON FORMULARY      at bedtime. CPAP and 1 lpm oxygen qhs         . tiotropium (SPIRIVA) 18 MCG inhalation capsule   Inhalation   Place 1 capsule (18 mcg total) into inhaler and inhale daily.   20 capsule   0   . clindamycin (CLEOCIN) 300 MG capsule   Oral   Take 1 capsule (300 mg total) by mouth 4 (four) times daily.   28 capsule   0    BP 163/63  Pulse 70  Temp(Src) 98.7 F (37.1 C) (Oral)  Resp 18  Ht 5' 5.5" (1.664 m)  Wt 270 lb (122.471 kg)  BMI 44.23 kg/m2  SpO2 97% Physical Exam  Nursing note and vitals reviewed. Constitutional: She is oriented to person, place, and  time. She appears well-developed and well-nourished. No distress.  HENT:  Head: Normocephalic and atraumatic.  Right Ear: Tympanic membrane and ear canal normal.  Left Ear: Tympanic membrane and ear canal normal.  Mouth/Throat: Uvula is midline, oropharynx is clear and moist and mucous membranes are normal. No trismus in the jaw. Dental caries present. No dental abscesses or uvula swelling.  Multiple dental caries of the lower teeth with pustule present at the lower right lateral incisor.  No facial swelling, obvious dental abscess, trismus, or sublingual abnml.    Neck: Normal range of motion and phonation normal. Neck supple. No mass and no thyromegaly present.  Cardiovascular: Normal rate, regular rhythm and normal heart sounds.   No murmur heard. Pulmonary/Chest: Effort normal and breath sounds normal.  Musculoskeletal: Normal range of motion.  Lymphadenopathy:    She has no cervical adenopathy.       Right: No supraclavicular adenopathy present.       Left: No supraclavicular adenopathy present.  Nickel sized, single, firm, movable nodule to the right cervical area.  No erythema.    Neurological: She is alert and oriented to person, place, and time. She exhibits normal muscle tone. Coordination normal.  Skin: Skin is warm and dry.    ED Course  Procedures (including critical care time) Labs Review Labs Reviewed  CBC WITH DIFFERENTIAL - Abnormal; Notable for the following:    Eosinophils Relative 6 (*)    All other components within normal limits  BASIC METABOLIC PANEL - Abnormal; Notable for the following:    GFR calc non Af Amer 71 (*)    GFR calc Af Amer 83 (*)    All other components within normal limits   Imaging Review No results found.  EKG Interpretation   None       MDM   1. Dental caries   2. Lymphadenopathy, cervical     Patient is well appearing, VSS.  Has a single, firm nodule to right cervical region.  Labs wnml, pt has multiple dental caries and  possibly enlg lymph node secondary to dental dz.  Will prescribe clindamycin and pt also advised of importance of close f/u with her PMD if sx's not improving.  She verbalized understanding of care plan and agrees. She appears stable for d/c    Dondrea Clendenin L. Vanessa Dolores, PA-C 06/05/13 1146

## 2013-06-06 NOTE — ED Provider Notes (Signed)
Medical screening examination/treatment/procedure(s) were performed by non-physician practitioner and as supervising physician I was immediately available for consultation/collaboration.  EKG Interpretation   None       Rolland Porter, MD, Abram Sander   Janice Norrie, MD 06/06/13 [phone]

## 2013-06-28 ENCOUNTER — Telehealth: Payer: Self-pay | Admitting: *Deleted

## 2013-06-28 MED ORDER — FUROSEMIDE 40 MG PO TABS: 40.0000 mg | ORAL_TABLET | Freq: Every day | ORAL | Status: DC

## 2013-06-28 NOTE — Telephone Encounter (Signed)
Med refilled and pt aware  

## 2013-06-28 NOTE — Telephone Encounter (Signed)
Rose from [facility] called letting you know that she had went to visit Ms. Brazill and saw that she had plus 2 edema in her right leg and plus 3 in her left leg, wants to know what you wanted to do. States she was on HCTZ but was taken off it.

## 2013-06-28 NOTE — Telephone Encounter (Signed)
Start lasix 40mg  once a day for the next week, schedule her an appt for next week, she needs to be seen HCTZ was stopped because it caused her gout flares   Lasix 40 mg 1 po daily #30 R 1

## 2013-07-05 ENCOUNTER — Ambulatory Visit (INDEPENDENT_AMBULATORY_CARE_PROVIDER_SITE_OTHER): Payer: Medicare Other | Admitting: Family Medicine

## 2013-07-05 VITALS — BP 130/80 | HR 76 | Temp 98.0°F | Resp 18 | Ht 63.0 in | Wt 284.0 lb

## 2013-07-05 DIAGNOSIS — R600 Localized edema: Secondary | ICD-10-CM

## 2013-07-05 DIAGNOSIS — R0602 Shortness of breath: Secondary | ICD-10-CM

## 2013-07-05 DIAGNOSIS — I10 Essential (primary) hypertension: Secondary | ICD-10-CM

## 2013-07-05 DIAGNOSIS — R609 Edema, unspecified: Secondary | ICD-10-CM

## 2013-07-05 MED ORDER — HYDROCODONE-ACETAMINOPHEN 5-325 MG PO TABS: 1.0000 | ORAL_TABLET | ORAL | Status: DC | PRN

## 2013-07-05 NOTE — Patient Instructions (Signed)
Take water pill in the morning and 6pm, starting today- through Monday  We will all Monday to see how you legs are doing Handicap form completed Labs will be done- we will call with results

## 2013-07-06 ENCOUNTER — Encounter: Payer: Self-pay | Admitting: Family Medicine

## 2013-07-06 LAB — COMPREHENSIVE METABOLIC PANEL
ALT: 11 U/L (ref 0–35)
AST: 13 U/L (ref 0–37)
Albumin: 4.1 g/dL (ref 3.5–5.2)
Alkaline Phosphatase: 63 U/L (ref 39–117)
BUN: 14 mg/dL (ref 6–23)
Calcium: 9.2 mg/dL (ref 8.4–10.5)
Chloride: 103 mEq/L (ref 96–112)
Creat: 0.86 mg/dL (ref 0.50–1.10)
Glucose, Bld: 64 mg/dL — ABNORMAL LOW (ref 70–99)
Total Bilirubin: 0.4 mg/dL (ref 0.3–1.2)

## 2013-07-06 LAB — CBC WITH DIFFERENTIAL/PLATELET
Basophils Relative: 1 % (ref 0–1)
HCT: 38 % (ref 36.0–46.0)
Hemoglobin: 12.7 g/dL (ref 12.0–15.0)
Lymphs Abs: 2.9 10*3/uL (ref 0.7–4.0)
MCHC: 33.4 g/dL (ref 30.0–36.0)
MCV: 86.2 fL (ref 78.0–100.0)
Monocytes Absolute: 0.6 10*3/uL (ref 0.1–1.0)
Monocytes Relative: 10 % (ref 3–12)
Neutro Abs: 2.4 10*3/uL (ref 1.7–7.7)
Neutrophils Relative %: 38 % — ABNORMAL LOW (ref 43–77)
RBC: 4.41 MIL/uL (ref 3.87–5.11)

## 2013-07-06 LAB — BRAIN NATRIURETIC PEPTIDE: Brain Natriuretic Peptide: <2 pg/mL (ref 0.0–100.0)

## 2013-07-06 NOTE — Assessment & Plan Note (Signed)
In the past she was on Lasix for leg swelling. She will continue 40 mg. Over the weekend I will increase her to Lasix 40 mg twice a day. Her labs will be checked today. I will also obtain an echocardiogram she does not have any known cardiac history other she does have sleep apnea as well as COPD

## 2013-07-06 NOTE — Progress Notes (Signed)
   Subjective:    Patient ID: [name], female    DOB: [birth year], 62 y.o.   MRN: [id number]  HPI  Patient here secondary to significant leg swelling. Her [facility] nurse was at her home last week and noticed 2-3+ edema bilaterally. She was taken off her hydrochlorothiazide secondary to increased gouty attacks. She was started on lisinopril instead. She occasionally gets shortness of breath but denies chest pain. She said the swelling just came on her. She was seen in the emergency room a month ago secondary to dental caries with lymphadenopathy which resolved with use of antibiotics.  She's been on Lasix 40 mg for the past week and states that her legs are much improved though she still has some swelling. Review of Systems  GEN- denies fatigue, fever, weight loss,weakness, recent illness HEENT- denies eye drainage, change in vision, nasal discharge, CVS- denies chest pain, palpitations RESP- + SOB, denies cough, wheeze ABD- denies N/V, change in stools, abd pain GU- denies dysuria, hematuria, dribbling, incontinence MSK- denies joint pain, muscle aches, injury Neuro- denies headache, dizziness, syncope, seizure activity      Objective:   Physical Exam GEN- NAD, alert and oriented x3 HEENT- PERRL, EOMI, non injected sclera, pink conjunctiva, MMM, oropharynx clear Neck- Supple, no JVD CVS- RRR, no murmur RESP-CTAB EXT- 1+  Edema LLE, pedal edema RLE Pulses- Radial, DP- 2+        Assessment & Plan:

## 2013-07-06 NOTE — Assessment & Plan Note (Signed)
Blood pressure is well controlled 

## 2013-07-16 ENCOUNTER — Ambulatory Visit (HOSPITAL_COMMUNITY)
Admission: RE | Admit: 2013-07-16 | Discharge: 2013-07-16 | Disposition: A | Discharge status: Home / Self Care | Payer: Medicare Other | Source: Ambulatory Visit | Attending: Family Medicine | Admitting: Family Medicine

## 2013-07-16 DIAGNOSIS — R609 Edema, unspecified: Secondary | ICD-10-CM | POA: Insufficient documentation

## 2013-07-16 DIAGNOSIS — R0602 Shortness of breath: Secondary | ICD-10-CM

## 2013-07-16 DIAGNOSIS — Z6841 Body Mass Index (BMI) 40.0 and over, adult: Secondary | ICD-10-CM | POA: Insufficient documentation

## 2013-07-16 DIAGNOSIS — J449 Chronic obstructive pulmonary disease, unspecified: Secondary | ICD-10-CM | POA: Insufficient documentation

## 2013-07-16 DIAGNOSIS — R0609 Other forms of dyspnea: Secondary | ICD-10-CM | POA: Insufficient documentation

## 2013-07-16 DIAGNOSIS — I10 Essential (primary) hypertension: Secondary | ICD-10-CM | POA: Insufficient documentation

## 2013-07-16 DIAGNOSIS — E785 Hyperlipidemia, unspecified: Secondary | ICD-10-CM | POA: Insufficient documentation

## 2013-07-16 DIAGNOSIS — E669 Obesity, unspecified: Secondary | ICD-10-CM | POA: Insufficient documentation

## 2013-07-16 DIAGNOSIS — J4489 Other specified chronic obstructive pulmonary disease: Secondary | ICD-10-CM | POA: Insufficient documentation

## 2013-07-16 DIAGNOSIS — R0989 Other specified symptoms and signs involving the circulatory and respiratory systems: Secondary | ICD-10-CM | POA: Insufficient documentation

## 2013-07-16 NOTE — Progress Notes (Signed)
*  PRELIMINARY RESULTS* Echocardiogram 2D Echocardiogram has been performed.  [place], [place] 07/16/2013, 11:16 AM

## 2013-07-29 ENCOUNTER — Ambulatory Visit: Payer: Medicare Other | Admitting: Critical Care Medicine

## 2013-08-02 ENCOUNTER — Encounter: Payer: Self-pay | Admitting: Family Medicine

## 2013-08-02 ENCOUNTER — Ambulatory Visit (INDEPENDENT_AMBULATORY_CARE_PROVIDER_SITE_OTHER): Payer: Medicare HMO | Admitting: Family Medicine

## 2013-08-02 VITALS — BP 120/84 | HR 78 | Temp 98.3°F | Resp 18 | Ht 64.0 in | Wt 280.0 lb

## 2013-08-02 DIAGNOSIS — Z1211 Encounter for screening for malignant neoplasm of colon: Secondary | ICD-10-CM

## 2013-08-02 DIAGNOSIS — R609 Edema, unspecified: Secondary | ICD-10-CM

## 2013-08-02 DIAGNOSIS — J439 Emphysema, unspecified: Secondary | ICD-10-CM

## 2013-08-02 DIAGNOSIS — G4733 Obstructive sleep apnea (adult) (pediatric): Secondary | ICD-10-CM

## 2013-08-02 DIAGNOSIS — E785 Hyperlipidemia, unspecified: Secondary | ICD-10-CM

## 2013-08-02 DIAGNOSIS — I10 Essential (primary) hypertension: Secondary | ICD-10-CM

## 2013-08-02 DIAGNOSIS — J438 Other emphysema: Secondary | ICD-10-CM

## 2013-08-02 DIAGNOSIS — M1990 Unspecified osteoarthritis, unspecified site: Secondary | ICD-10-CM

## 2013-08-02 MED ORDER — FUROSEMIDE 40 MG PO TABS: 40.0000 mg | ORAL_TABLET | Freq: Every day | ORAL | Status: DC

## 2013-08-02 MED ORDER — HYDROCODONE-ACETAMINOPHEN 5-325 MG PO TABS: 1.0000 | ORAL_TABLET | ORAL | Status: DC | PRN

## 2013-08-02 NOTE — Progress Notes (Signed)
   Subjective:    Patient ID: [name], female    DOB: [birth year], 63 y.o.   MRN: [id number]  HPI  Patient here to followup leg edema. She was seen 4 weeks ago started on Lasix she has lost 4 pounds since that visit. Her recent cell labs were unremarkable she had 2-D echo which did not show a significant heart failure. She requests a refill on her pain medication which she uses for her foot as well as her back.  COPD-she is unable to afford her Spiriva therefore has been out recently. She has a followup appointment on January 19 with pulmonary. She does continue to get short of breath with minimal exertion at times. She is using her CPAP for her obstructive sleep apnea  She agrees to colonoscopy  Review of Systems  GEN- denies fatigue, fever, weight loss,weakness, recent illness HEENT- denies eye drainage, change in vision, nasal discharge, CVS- denies chest pain, palpitations RESP- denies SOB, cough, wheeze ABD- denies N/V, change in stools, abd pain GU- denies dysuria, hematuria, dribbling, incontinence MSK- + joint pain, muscle aches, injury Neuro- denies headache, dizziness, syncope, seizure activity      Objective:   Physical Exam GEN- NAD, alert and oriented x3 HEENT- PERRL, EOMI, non injected sclera, pink conjunctiva, MMM, oropharynx clear Neck- Supple,  CVS- RRR, no murmur RESP-CTAB EXT- Pedal edema Pulses- Radial, DP- 2+        Assessment & Plan:

## 2013-08-02 NOTE — Patient Instructions (Signed)
Continue current medications Follow-up with Lung doctor Referral for colonoscopy  F/U 3 months for Physical with PAP Smear

## 2013-08-02 NOTE — Assessment & Plan Note (Signed)
Well controlled with medication change

## 2013-08-02 NOTE — Assessment & Plan Note (Signed)
Uncontrolled, but declines medications

## 2013-08-02 NOTE — Assessment & Plan Note (Signed)
Leg edema is much improved. She will continue Lasix 40 mg once a day.

## 2013-08-02 NOTE — Assessment & Plan Note (Signed)
Spiriva given in office, f/u pulmonary

## 2013-08-02 NOTE — Assessment & Plan Note (Signed)
Hydrocodone refilled.  

## 2013-08-02 NOTE — Assessment & Plan Note (Signed)
F/u with pulmonary she continues to benefit from CPAP Use

## 2013-08-05 ENCOUNTER — Encounter (INDEPENDENT_AMBULATORY_CARE_PROVIDER_SITE_OTHER): Payer: Self-pay | Admitting: *Deleted

## 2013-08-12 ENCOUNTER — Encounter: Payer: Self-pay | Admitting: Critical Care Medicine

## 2013-08-12 ENCOUNTER — Ambulatory Visit (INDEPENDENT_AMBULATORY_CARE_PROVIDER_SITE_OTHER): Payer: Medicare HMO | Admitting: Critical Care Medicine

## 2013-08-12 VITALS — BP 136/78 | HR 75 | Temp 97.6°F | Ht 65.5 in | Wt 280.6 lb

## 2013-08-12 DIAGNOSIS — J438 Other emphysema: Secondary | ICD-10-CM

## 2013-08-12 DIAGNOSIS — J439 Emphysema, unspecified: Secondary | ICD-10-CM

## 2013-08-12 MED ORDER — TIOTROPIUM BROMIDE MONOHYDRATE 18 MCG IN CAPS: 18.0000 ug | ORAL_CAPSULE | Freq: Every day | RESPIRATORY_TRACT | Status: DC

## 2013-08-12 NOTE — Progress Notes (Signed)
Subjective:    Patient ID: [name], female    DOB: Jul 27, 1950, 63 y.o.   MRN: [id number]  HPI   5 -year-old [place] American female with a history of obstructive sleep apnea, chronic obstructive lung disease, asthmatic bronchitis.   08/12/2013 Chief Complaint  Patient presents with  . 4 month follow up    Breathing is unchanged.  Has DOE,  some wheezing, chest tightness occas, and nonprod cough.    Has good and bad days. Cough is dry.  Notes doe and wheezing. Notes some chest tightness.  On cpap at night with oxygen.   Pt denies any significant sore throat, nasal congestion or excess secretions, fever, chills, sweats, unintended weight loss, pleurtic or exertional chest pain, orthopnea PND, or leg swelling Pt denies any increase in rescue therapy over baseline, denies waking up needing it or having any early am or nocturnal exacerbations of coughing/wheezing/or dyspnea. Pt also denies any obvious fluctuation in symptoms with  weather or environmental change or other alleviating or aggravating factors    Review of Systems  11 pt ros neg except as above in hpi    Objective:   Physical Exam   Filed Vitals:   08/12/13 1124  BP: 136/78  Pulse: 75  Temp: 97.6 F (36.4 C)  TempSrc: Oral  Height: 5' 5.5" (1.664 m)  Weight: 280 lb 9.6 oz (127.279 kg)  SpO2: 95%    Gen: Pleasant, well-nourished, in no distress,  normal affect  ENT: No lesions,  mouth clear,  oropharynx clear, no postnasal drip  Neck: No JVD, no TMG, no carotid bruits  Lungs: No use of accessory muscles, no dullness to percussion, distant breath sounds without wheezes or rhonchi  Cardiovascular: RRR, heart sounds normal, no murmur or gallops, no peripheral edema  Abdomen: soft and NT, no HSM,  BS normal  Musculoskeletal: No deformities, no cyanosis or clubbing  Neuro: alert, non focal  Skin: Warm, no lesions or rashes  No results found.       Assessment & Plan:   COPD with emphysema  gold stage B Gold B Copd stable at present Stable PFTs Plan No change in inhaled or maintenance medications. Return in  6 months    Updated Medication List Outpatient Encounter Prescriptions as of 08/12/2013  Medication Sig  . aspirin 325 MG tablet Take 325 mg by mouth daily.   . clotrimazole-betamethasone (LOTRISONE) cream Apply 1 application topically 2 (two) times daily as needed. To be applied to the feet as needed  . colchicine 0.6 MG tablet Take 0.6 mg by mouth daily as needed.  . fluticasone (CUTIVATE) 0.05 % cream Apply 1 application topically daily.   . furosemide (LASIX) 40 MG tablet Take 1 tablet (40 mg total) by mouth daily.  Marland Kitchen HYDROcodone-acetaminophen (NORCO/VICODIN) 5-325 MG per tablet Take 1 tablet by mouth every 4 (four) hours as needed.  Marland Kitchen lisinopril (PRINIVIL,ZESTRIL) 10 MG tablet Take 1 tablet (10 mg total) by mouth daily.  . NON FORMULARY at bedtime. CPAP and 1 lpm oxygen qhs  . tiotropium (SPIRIVA) 18 MCG inhalation capsule Place 1 capsule (18 mcg total) into inhaler and inhale daily.  . [DISCONTINUED] colchicine 0.6 MG tablet Take 1 tablet (0.6 mg total) by mouth daily.  . [DISCONTINUED] tiotropium (SPIRIVA) 18 MCG inhalation capsule Place 1 capsule (18 mcg total) into inhaler and inhale daily.  . [DISCONTINUED] ibuprofen (ADVIL,MOTRIN) 600 MG tablet Take 1 tablet (600 mg total) by mouth every 8 (eight) hours as needed for pain.

## 2013-08-12 NOTE — Patient Instructions (Signed)
Stay on Spiriva Return 6 months

## 2013-08-12 NOTE — Assessment & Plan Note (Signed)
Gold B Copd stable at present Stable PFTs Plan No change in inhaled or maintenance medications. Return in  6 months

## 2013-09-03 ENCOUNTER — Emergency Department (HOSPITAL_COMMUNITY)
Admission: EM | Admit: 2013-09-03 | Discharge: 2013-09-03 | Disposition: A | Discharge status: Home / Self Care | Payer: Medicare HMO | Attending: Emergency Medicine | Admitting: Emergency Medicine

## 2013-09-03 ENCOUNTER — Encounter (HOSPITAL_COMMUNITY): Payer: Self-pay | Admitting: Emergency Medicine

## 2013-09-03 ENCOUNTER — Other Ambulatory Visit: Payer: Self-pay | Admitting: Family Medicine

## 2013-09-03 ENCOUNTER — Emergency Department (HOSPITAL_COMMUNITY): Payer: Medicare HMO

## 2013-09-03 DIAGNOSIS — G4733 Obstructive sleep apnea (adult) (pediatric): Secondary | ICD-10-CM | POA: Insufficient documentation

## 2013-09-03 DIAGNOSIS — Z79899 Other long term (current) drug therapy: Secondary | ICD-10-CM | POA: Insufficient documentation

## 2013-09-03 DIAGNOSIS — Z8659 Personal history of other mental and behavioral disorders: Secondary | ICD-10-CM | POA: Insufficient documentation

## 2013-09-03 DIAGNOSIS — E785 Hyperlipidemia, unspecified: Secondary | ICD-10-CM | POA: Insufficient documentation

## 2013-09-03 DIAGNOSIS — I10 Essential (primary) hypertension: Secondary | ICD-10-CM | POA: Insufficient documentation

## 2013-09-03 DIAGNOSIS — M25579 Pain in unspecified ankle and joints of unspecified foot: Secondary | ICD-10-CM

## 2013-09-03 DIAGNOSIS — IMO0002 Reserved for concepts with insufficient information to code with codable children: Secondary | ICD-10-CM | POA: Insufficient documentation

## 2013-09-03 DIAGNOSIS — E662 Morbid (severe) obesity with alveolar hypoventilation: Secondary | ICD-10-CM | POA: Insufficient documentation

## 2013-09-03 DIAGNOSIS — M109 Gout, unspecified: Secondary | ICD-10-CM | POA: Insufficient documentation

## 2013-09-03 DIAGNOSIS — Z8719 Personal history of other diseases of the digestive system: Secondary | ICD-10-CM | POA: Insufficient documentation

## 2013-09-03 DIAGNOSIS — Z7982 Long term (current) use of aspirin: Secondary | ICD-10-CM | POA: Insufficient documentation

## 2013-09-03 DIAGNOSIS — M1990 Unspecified osteoarthritis, unspecified site: Secondary | ICD-10-CM | POA: Insufficient documentation

## 2013-09-03 DIAGNOSIS — J449 Chronic obstructive pulmonary disease, unspecified: Secondary | ICD-10-CM | POA: Insufficient documentation

## 2013-09-03 DIAGNOSIS — Z9889 Other specified postprocedural states: Secondary | ICD-10-CM | POA: Insufficient documentation

## 2013-09-03 DIAGNOSIS — J4489 Other specified chronic obstructive pulmonary disease: Secondary | ICD-10-CM | POA: Insufficient documentation

## 2013-09-03 DIAGNOSIS — Z87891 Personal history of nicotine dependence: Secondary | ICD-10-CM | POA: Insufficient documentation

## 2013-09-03 MED ORDER — HYDROCODONE-ACETAMINOPHEN 5-325 MG PO TABS: 2.0000 | ORAL_TABLET | ORAL | Status: DC | PRN

## 2013-09-03 MED ORDER — HYDROCODONE-ACETAMINOPHEN 5-325 MG PO TABS
2.0000 | ORAL_TABLET | Freq: Once | ORAL | Status: AC
  Administered 2013-09-03: 2 via ORAL
  Filled 2013-09-03: qty 2

## 2013-09-03 MED ORDER — COLCHICINE 0.6 MG PO TABS
1.2000 mg | ORAL_TABLET | Freq: Once | ORAL | Status: AC
  Administered 2013-09-03: 1.2 mg via ORAL
  Filled 2013-09-03: qty 2

## 2013-09-03 NOTE — Discharge Instructions (Signed)
Ankle Pain Ankle pain is a common symptom. The bones, cartilage, tendons, and muscles of the ankle joint perform a lot of work each day. The ankle joint holds your body weight and allows you to move around. Ankle pain can occur on either side or back of 1 or both ankles. Ankle pain may be sharp and burning or dull and aching. There may be tenderness, stiffness, redness, or warmth around the ankle. The pain occurs more often when a person walks or puts pressure on the ankle. CAUSES  There are many reasons ankle pain can develop. It is important to work with your caregiver to identify the cause since many conditions can impact the bones, cartilage, muscles, and tendons. Causes for ankle pain include:  Injury, including a break (fracture), sprain, or strain often due to a fall, sports, or a high-impact activity.  Swelling (inflammation) of a tendon (tendonitis).  Achilles tendon rupture.  Ankle instability after repeated sprains and strains.  Poor foot alignment.  Pressure on a nerve (tarsal tunnel syndrome).  Arthritis in the ankle or the lining of the ankle.  Crystal formation in the ankle (gout or pseudogout). DIAGNOSIS  A diagnosis is based on your medical history, your symptoms, results of your physical exam, and results of diagnostic tests. Diagnostic tests may include X-ray exams or a computerized magnetic scan (magnetic resonance imaging, MRI). TREATMENT  Treatment will depend on the cause of your ankle pain and may include:  Keeping pressure off the ankle and limiting activities.  Using crutches or other walking support (a cane or brace).  Using rest, ice, compression, and elevation.  Participating in physical therapy or home exercises.  Wearing shoe inserts or special shoes.  Losing weight.  Taking medications to reduce pain or swelling or receiving an injection.  Undergoing surgery. HOME CARE INSTRUCTIONS   Only take over-the-counter or prescription medicines for  pain, discomfort, or fever as directed by your caregiver.  Put ice on the injured area.  Put ice in a plastic bag.  Place a towel between your skin and the bag.  Leave the ice on for 15-20 minutes at a time, 03-04 times a day.  Keep your leg raised (elevated) when possible to lessen swelling.  Avoid activities that cause ankle pain.  Follow specific exercises as directed by your caregiver.  Record how often you have ankle pain, the location of the pain, and what it feels like. This information may be helpful to you and your caregiver.  Ask your caregiver about returning to work or sports and whether you should drive.  Follow up with your caregiver for further examination, therapy, or testing as directed. SEEK MEDICAL CARE IF:   Pain or swelling continues or worsens beyond 1 week.  You have an oral temperature above 102 F (38.9 C).  You are feeling unwell or have chills.  You are having an increasingly difficult time with walking.  You have loss of sensation or other new symptoms.  You have questions or concerns. MAKE SURE YOU:   Understand these instructions.  Will watch your condition.  Will get help right away if you are not doing well or get worse. Document Released: 12/29/2009 Document Revised: 10/03/2011 Document Reviewed: 12/29/2009 [facility] Patient Information 2014 [place]. Gout Gout is an inflammatory arthritis caused by a buildup of uric acid crystals in the joints. Uric acid is a chemical that is normally present in the blood. When the level of uric acid in the blood is too high it can  form crystals that deposit in your joints and tissues. This causes joint redness, soreness, and swelling (inflammation). Repeat attacks are common. Over time, uric acid crystals can form into masses (tophi) near a joint, destroying bone and causing disfigurement. Gout is treatable and often preventable. CAUSES  The disease begins with elevated levels of uric acid in the  blood. Uric acid is produced by your body when it breaks down a naturally found substance called purines. Certain foods you eat, such as meats and fish, contain high amounts of purines. Causes of an elevated uric acid level include:  Being passed down from parent to child (heredity).  Diseases that cause increased uric acid production (such as obesity, psoriasis, and certain cancers).  Excessive alcohol use.  Diet, especially diets rich in meat and seafood.  Medicines, including certain cancer-fighting medicines (chemotherapy), water pills (diuretics), and aspirin.  Chronic kidney disease. The kidneys are no longer able to remove uric acid well.  Problems with metabolism. Conditions strongly associated with gout include:  Obesity.  High blood pressure.  High cholesterol.  Diabetes. Not everyone with elevated uric acid levels gets gout. It is not understood why some people get gout and others do not. Surgery, joint injury, and eating too much of certain foods are some of the factors that can lead to gout attacks. SYMPTOMS   An attack of gout comes on quickly. It causes intense pain with redness, swelling, and warmth in a joint.  Fever can occur.  Often, only one joint is involved. Certain joints are more commonly involved:  Base of the big toe.  Knee.  Ankle.  Wrist.  Finger. Without treatment, an attack usually goes away in a few days to weeks. Between attacks, you usually will not have symptoms, which is different from many other forms of arthritis. DIAGNOSIS  Your caregiver will suspect gout based on your symptoms and exam. In some cases, tests may be recommended. The tests may include:  Blood tests.  Urine tests.  X-rays.  Joint fluid exam. This exam requires a needle to remove fluid from the joint (arthrocentesis). Using a microscope, gout is confirmed when uric acid crystals are seen in the joint fluid. TREATMENT  There are two phases to gout treatment:  treating the sudden onset (acute) attack and preventing attacks (prophylaxis).  Treatment of an Acute Attack.  Medicines are used. These include anti-inflammatory medicines or steroid medicines.  An injection of steroid medicine into the affected joint is sometimes necessary.  The painful joint is rested. Movement can worsen the arthritis.  You may use warm or cold treatments on painful joints, depending which works best for you.  Treatment to Prevent Attacks.  If you suffer from frequent gout attacks, your caregiver may advise preventive medicine. These medicines are started after the acute attack subsides. These medicines either help your kidneys eliminate uric acid from your body or decrease your uric acid production. You may need to stay on these medicines for a very long time.  The early phase of treatment with preventive medicine can be associated with an increase in acute gout attacks. For this reason, during the first few months of treatment, your caregiver may also advise you to take medicines usually used for acute gout treatment. Be sure you understand your caregiver's directions. Your caregiver may make several adjustments to your medicine dose before these medicines are effective.  Discuss dietary treatment with your caregiver or dietitian. Alcohol and drinks high in sugar and fructose and foods such as meat, poultry,  and seafood can increase uric acid levels. Your caregiver or dietician can advise you on drinks and foods that should be limited. HOME CARE INSTRUCTIONS   Do not take aspirin to relieve pain. This raises uric acid levels.  Only take over-the-counter or prescription medicines for pain, discomfort, or fever as directed by your caregiver.  Rest the joint as much as possible. When in bed, keep sheets and blankets off painful areas.  Keep the affected joint raised (elevated).  Apply warm or cold treatments to painful joints. Use of warm or cold treatments depends on  which works best for you.  Use crutches if the painful joint is in your leg.  Drink enough fluids to keep your urine clear or pale yellow. This helps your body get rid of uric acid. Limit alcohol, sugary drinks, and fructose drinks.  Follow your dietary instructions. Pay careful attention to the amount of protein you eat. Your daily diet should emphasize fruits, vegetables, whole grains, and fat-free or low-fat milk products. Discuss the use of coffee, vitamin C, and cherries with your caregiver or dietician. These may be helpful in lowering uric acid levels.  Maintain a healthy body weight. SEEK MEDICAL CARE IF:   You develop diarrhea, vomiting, or any side effects from medicines.  You do not feel better in 24 hours, or you are getting worse. SEEK IMMEDIATE MEDICAL CARE IF:   Your joint becomes suddenly more tender, and you have chills or a fever. MAKE SURE YOU:   Understand these instructions.  Will watch your condition.  Will get help right away if you are not doing well or get worse. Document Released: 07/08/2000 Document Revised: 11/05/2012 Document Reviewed: 02/22/2012 [facility] Patient Information 2014 [place].   Follow-up with primary MD as needed Rest and elevate ankle Take meds as prescribed

## 2013-09-03 NOTE — ED Provider Notes (Signed)
CSN: [id number]     Arrival date & time 09/03/13  1648 History   First MD Initiated Contact with Patient 09/03/13 1723     Chief Complaint  Patient presents with  . Foot Pain     (Consider location/radiation/quality/duration/timing/severity/associated sxs/prior Treatment) Patient is a 63 y.o. female presenting with ankle pain. No language interpreter was used.  Ankle Pain Location:  Ankle Injury: no   Ankle location:  R ankle Pain details:    Quality:  Aching   Radiates to:  Does not radiate Associated symptoms: swelling   Associated symptoms: no back pain, no fatigue, no fever, no numbness and no tingling   Associated symptoms comment:  Difficulty bearing weight on right foot  Pt is a 63 year old female who presents with right ankle and foot pain for the last 24 hours. She denies any injury. She reports that she has had swelling and difficulty bearing weight on her right foot. She denies numbness or tingling in her foot. She reports a history of gout and reports that she usually has flare-ups in her toes.   Past Medical History  Diagnosis Date  . Obesity hypoventilation syndrome   . Obstructive sleep apnea   . Osteoarthritis   . Low back pain   . HTN (hypertension)   . Hyperlipidemia   . GERD (gastroesophageal reflux disease)   . Depression   . COPD (chronic obstructive pulmonary disease)   . Anxiety   . Gout   . Achilles tendinitis    Past Surgical History  Procedure Laterality Date  . Tubal ligation    . Cardiac catheterization  2006   Family History  Problem Relation Age of Onset  . Heart disease Mother   . Thyroid disease Mother   . Heart failure Father   . Diabetes Brother   . Crohn's disease Daughter    History  Substance Use Topics  . Smoking status: Former Smoker -- 1.50 packs/day for 30 years    Types: Cigarettes    Quit date: 03/25/2005  . Smokeless tobacco: Never Used  . Alcohol Use: No   OB History   Grav Para Term Preterm Abortions TAB SAB  Ect Mult Living   3 3 3       3      Review of Systems  Constitutional: Negative for fever, chills and fatigue.  Musculoskeletal: Positive for arthralgias and joint swelling. Negative for back pain.  All other systems reviewed and are negative.      Allergies  Review of patient's allergies indicates no known allergies.  Home Medications   Current Outpatient Rx  Name  Route  Sig  Dispense  Refill  . aspirin 325 MG tablet   Oral   Take 325 mg by mouth daily.          . clotrimazole-betamethasone (LOTRISONE) cream   Topical   Apply 1 application topically 2 (two) times daily as needed. To be applied to the feet as needed         . colchicine 0.6 MG tablet      TAKE 1 TABLET BY MOUTH EVERY DAY   30 tablet   1   . fluticasone (CUTIVATE) 0.05 % cream   Topical   Apply 1 application topically daily.          . furosemide (LASIX) 40 MG tablet   Oral   Take 1 tablet (40 mg total) by mouth daily.   30 tablet   3   . HYDROcodone-acetaminophen (NORCO/VICODIN)  5-325 MG per tablet   Oral   Take 1 tablet by mouth every 4 (four) hours as needed.   45 tablet   0   . lisinopril (PRINIVIL,ZESTRIL) 10 MG tablet   Oral   Take 1 tablet (10 mg total) by mouth daily.   30 tablet   3   . NON FORMULARY      at bedtime. CPAP and 1 lpm oxygen qhs         . tiotropium (SPIRIVA) 18 MCG inhalation capsule   Inhalation   Place 1 capsule (18 mcg total) into inhaler and inhale daily.   30 capsule   6    BP 152/61  Pulse 72  Temp(Src) 97.7 F (36.5 C) (Oral)  Resp 20  Ht 5\' 5"  (1.651 m)  Wt 270 lb (122.471 kg)  BMI 44.93 kg/m2  SpO2 100% Physical Exam  Nursing note and vitals reviewed. Constitutional: She is oriented to person, place, and time. She appears well-developed and well-nourished. No distress.  HENT:  Head: Normocephalic and atraumatic.  Eyes: EOM are normal.  Neck: Normal range of motion. Neck supple.  Cardiovascular: Normal rate, regular rhythm and  normal heart sounds.   Pulmonary/Chest: Effort normal and breath sounds normal. No respiratory distress. She has no wheezes.  Musculoskeletal:       Right ankle: She exhibits decreased range of motion and swelling. She exhibits no deformity. Tenderness. Lateral malleolus tenderness found.  Tenderness to right lateral malleolus and anterior foot. TTP of right great toe. Mild edema and warmth, no erythema. Limited ROM due to pain and discomfort. No numbness or tingling. 2+ pulses, capillary refill <3secs.    Neurological: She is alert and oriented to person, place, and time.  Skin: Skin is warm and dry.  Psychiatric: Her behavior is normal. Judgment and thought content normal.    ED Course  Procedures (including critical care time) Labs Review Labs Reviewed - No data to display Imaging Review No results found.  EKG Interpretation   None       MDM   Final diagnoses:  Ankle pain  Gout    Difficulty with weight bearing right ankle/foot. Warmth in ankle and TTP in ankle and right great toe. Probable beginning of gout flare-up. Colchicine given here in ER and pt reports that she has some at home as well. Elevate foot at home and wear ace wrap for support. Hydrocodone prescription for more intense pain. No numbness or tingling, good strength and sensation. Pulses 2+. Decreased ROM due to pain. No erythema or signs of cellulitis.     Elisha Headland, NP 09/03/13 1757

## 2013-09-03 NOTE — Telephone Encounter (Signed)
ok 

## 2013-09-03 NOTE — ED Notes (Signed)
Pt seen and evaluated by EDNP for initial assessment. 

## 2013-09-03 NOTE — ED Notes (Signed)
Patient c/o right foot pain that started yesterday. Patient denies any known injury.  Large amount of swelling noted in right ankle. Patient denies hx of CHF or diabetes.

## 2013-09-03 NOTE — Telephone Encounter (Signed)
?   Ok to refill, last refill 04/2013, last ov 08/02/13

## 2013-09-05 NOTE — ED Provider Notes (Signed)
Medical screening examination/treatment/procedure(s) were performed by non-physician practitioner and as supervising physician I was immediately available for consultation/collaboration.  EKG Interpretation   None        Mervin Kung, MD 09/05/13 [phone]

## 2013-09-06 ENCOUNTER — Other Ambulatory Visit: Payer: Self-pay | Admitting: *Deleted

## 2013-09-11 ENCOUNTER — Telehealth: Payer: Self-pay | Admitting: *Deleted

## 2013-09-11 DIAGNOSIS — Z1231 Encounter for screening mammogram for malignant neoplasm of breast: Secondary | ICD-10-CM

## 2013-09-12 MED FILL — Hydrocodone-Acetaminophen Tab 5-325 MG: ORAL | Qty: 6 | Status: AC

## 2013-09-12 NOTE — Telephone Encounter (Signed)
Encounter for Mammogram

## 2013-09-16 ENCOUNTER — Encounter (INDEPENDENT_AMBULATORY_CARE_PROVIDER_SITE_OTHER): Payer: Self-pay | Admitting: *Deleted

## 2013-09-16 ENCOUNTER — Other Ambulatory Visit (INDEPENDENT_AMBULATORY_CARE_PROVIDER_SITE_OTHER): Payer: Self-pay | Admitting: *Deleted

## 2013-09-16 ENCOUNTER — Telehealth (INDEPENDENT_AMBULATORY_CARE_PROVIDER_SITE_OTHER): Payer: Self-pay | Admitting: *Deleted

## 2013-09-16 DIAGNOSIS — Z1211 Encounter for screening for malignant neoplasm of colon: Secondary | ICD-10-CM

## 2013-09-16 MED ORDER — PEG 3350-KCL-NA BICARB-NACL 420 G PO SOLR
4000.0000 mL | Freq: Once | ORAL | Status: DC
Start: 2013-09-16 — End: 2013-09-30

## 2013-09-16 NOTE — Telephone Encounter (Signed)
Patient needs trilyte 

## 2013-09-20 ENCOUNTER — Ambulatory Visit (HOSPITAL_COMMUNITY): Payer: Medicare HMO

## 2013-09-26 ENCOUNTER — Ambulatory Visit (HOSPITAL_COMMUNITY)
Admission: RE | Admit: 2013-09-26 | Discharge: 2013-09-26 | Disposition: A | Discharge status: Home / Self Care | Payer: Medicare HMO | Source: Ambulatory Visit | Attending: Family Medicine | Admitting: Family Medicine

## 2013-09-26 DIAGNOSIS — Z1231 Encounter for screening mammogram for malignant neoplasm of breast: Secondary | ICD-10-CM | POA: Insufficient documentation

## 2013-09-30 ENCOUNTER — Ambulatory Visit (INDEPENDENT_AMBULATORY_CARE_PROVIDER_SITE_OTHER): Payer: Medicare HMO | Admitting: Family Medicine

## 2013-09-30 ENCOUNTER — Encounter: Payer: Self-pay | Admitting: Family Medicine

## 2013-09-30 VITALS — BP 136/78 | HR 88 | Temp 98.4°F | Resp 18 | Ht 64.0 in | Wt 278.0 lb

## 2013-09-30 DIAGNOSIS — M549 Dorsalgia, unspecified: Secondary | ICD-10-CM

## 2013-09-30 DIAGNOSIS — E785 Hyperlipidemia, unspecified: Secondary | ICD-10-CM

## 2013-09-30 DIAGNOSIS — M25559 Pain in unspecified hip: Secondary | ICD-10-CM

## 2013-09-30 DIAGNOSIS — L720 Epidermal cyst: Secondary | ICD-10-CM

## 2013-09-30 DIAGNOSIS — L723 Sebaceous cyst: Secondary | ICD-10-CM

## 2013-09-30 LAB — LIPID PANEL
Cholesterol: 300 mg/dL — ABNORMAL HIGH (ref 0–200)
HDL: 45 mg/dL (ref >39)
LDL CALC: 218 mg/dL — AB (ref 0–99)
Total CHOL/HDL Ratio: 6.7 Ratio
Triglycerides: 186 mg/dL — ABNORMAL HIGH (ref <150)
VLDL: 37 mg/dL (ref 0–40)

## 2013-09-30 MED ORDER — HYDROCODONE-ACETAMINOPHEN 5-325 MG PO TABS: 1.0000 | ORAL_TABLET | ORAL | Status: DC | PRN

## 2013-09-30 MED ORDER — METHOCARBAMOL 500 MG PO TABS: 500.0000 mg | ORAL_TABLET | Freq: Two times a day (BID) | ORAL | Status: DC | PRN

## 2013-09-30 NOTE — Patient Instructions (Signed)
Referral to surgery for cyst removal Get Xray of Back and HIp Use pain medication and muscle relaxer F/u as previous

## 2013-09-30 NOTE — Progress Notes (Signed)
Patient ID: [name], female   DOB: [birth year], 63 y.o.   MRN: [id number]   Subjective:    Patient ID: [name], female    DOB: 1950/10/04, 63 y.o.   MRN: [id number]  Patient presents for R side pain  patient here with right lower back pain and hip pain. She states this is been going on for the past 3 weeks. No change in bowel or bladder. Denies any dysuria or abdominal pain. No recent falls , OTC NSAIDS did not help She also has a cyst that has been on her back for some time but has been increasing in size it is uncomfortable wearing her bra lays on it.    Review Of Systems:  GEN- denies fatigue, fever, weight loss,weakness, recent illness HEENT- denies eye drainage, change in vision, nasal discharge, CVS- denies chest pain, palpitations RESP- denies SOB, cough, wheeze ABD- denies N/V, change in stools, abd pain GU- denies dysuria, hematuria, dribbling, incontinence MSK-+ joint pain, muscle aches, injury Neuro- denies headache, dizziness, syncope, seizure activity      Objective:    BP 136/78  Pulse 88  Temp(Src) 98.4 F (36.9 C)  Resp 18  Ht 5\' 4"  (1.626 m)  Wt 278 lb (126.1 kg)  BMI 47.70 kg/m2 GEN- NAD, alert and oriented x3 CVS- RRR, no murmur RESP-CTAB ABD-NABS,soft,NT,ND MSK- Spine TTP lumbar spine and right paraspinals, neg SLR, pain with ROM Right Hip Neuro- Strength equal bilat LE, DTR symmetric, normal tone EXT- trace edema Skin- large epidermal cyst center of back- lower thoracic region Pulses- Radial 2+        Assessment & Plan:      Problem List Items Addressed This Visit   HYPERLIPIDEMIA - Primary   Relevant Medications      ZETIA 10 MG PO TABS   Other Relevant Orders      Lipid panel (Completed)   Epidermal cyst     Increasing size there does not appear to be acutely infected. We'll send to Gen. surgery to have this removed    Back pain     No red flags on exam but I am concerned that she has osteoarthritis of the spine  likely some in her right hip as well. I will obtain images. I've given her hydrocodone to use as well as muscle relaxer short-term    Relevant Medications      methocarbamol (ROBAXIN) tablet      HYDROcodone-acetaminophen (NORCO/VICODIN) 5-325 MG per tablet   Other Relevant Orders      DG Lumbar Spine Complete      DG Hip Bilateral W/Pelvis    Other Visit Diagnoses   Hip pain        Relevant Orders       DG Hip Bilateral W/Pelvis       Note: This dictation was prepared with Dragon dictation along with smaller phrase technology. Any transcriptional errors that result from this process are unintentional.

## 2013-10-01 DIAGNOSIS — L720 Epidermal cyst: Secondary | ICD-10-CM | POA: Insufficient documentation

## 2013-10-01 MED ORDER — EZETIMIBE 10 MG PO TABS: 10.0000 mg | ORAL_TABLET | Freq: Every day | ORAL | Status: DC

## 2013-10-01 NOTE — Assessment & Plan Note (Signed)
Increasing size there does not appear to be acutely infected. We'll send to Gen. surgery to have this removed

## 2013-10-01 NOTE — Assessment & Plan Note (Signed)
No red flags on exam but I am concerned that she has osteoarthritis of the spine likely some in her right hip as well. I will obtain images. I've given her hydrocodone to use as well as muscle relaxer short-term

## 2013-10-08 ENCOUNTER — Telehealth: Payer: Self-pay | Admitting: *Deleted

## 2013-10-08 ENCOUNTER — Telehealth: Payer: Self-pay | Admitting: Family Medicine

## 2013-10-08 NOTE — Telephone Encounter (Signed)
Okay to send referral for Podiatry --- Nail

## 2013-10-08 NOTE — Telephone Encounter (Signed)
Change to to simvastatin 20mg  at bedtime

## 2013-10-08 NOTE — Telephone Encounter (Signed)
Call back number is [phone] The medication that was called in for her cholesterol was 135$ and it is to expensive for the pt can she have something else

## 2013-10-08 NOTE — Telephone Encounter (Signed)
Pt states has a toenail that needs to be removed, has been done before but now it has come back real thick and wants to know if you can refer her podiatry someone in [place] who can remove her nail..please advise!

## 2013-10-09 MED ORDER — SIMVASTATIN 20 MG PO TABS: 20.0000 mg | ORAL_TABLET | Freq: Every day | ORAL | Status: DC

## 2013-10-09 NOTE — Telephone Encounter (Signed)
Call placed to patient and patient made aware.   Prescription sent to pharmacy.  

## 2013-10-10 ENCOUNTER — Other Ambulatory Visit: Payer: Self-pay | Admitting: Family Medicine

## 2013-10-10 DIAGNOSIS — L600 Ingrowing nail: Secondary | ICD-10-CM

## 2013-10-10 NOTE — Telephone Encounter (Signed)
Referral placed.

## 2013-10-11 ENCOUNTER — Telehealth (INDEPENDENT_AMBULATORY_CARE_PROVIDER_SITE_OTHER): Payer: Self-pay | Admitting: *Deleted

## 2013-10-11 NOTE — Telephone Encounter (Signed)
  Procedure: tcs  Reason/Indication:  screening  Has patient had this procedure before?  no  If so, when, by whom and where?    Is there a family history of colon cancer?  no  Who?  What age when diagnosed?    Is patient diabetic?   no      Does patient have prosthetic heart valve?  no  Do you have a pacemaker?  no  Has patient ever had endocarditis? no  Has patient had joint replacement within last 12 months?  no  Does patient tend to be constipated or take laxatives? ocassionally  Is patient on Coumadin, Plavix and/or Aspirin? yes  Medications: see epic  Allergies: nkda  Medication Adjustment: asa 2 days  Procedure date & time: 11/07/13

## 2013-10-14 NOTE — Telephone Encounter (Signed)
agree

## 2013-10-18 ENCOUNTER — Telehealth (INDEPENDENT_AMBULATORY_CARE_PROVIDER_SITE_OTHER): Payer: Self-pay | Admitting: *Deleted

## 2013-10-18 NOTE — Telephone Encounter (Signed)
[employer] auth# [id number] -- [place] -- 11/07/13 - 02/05/14

## 2013-10-23 ENCOUNTER — Encounter (HOSPITAL_COMMUNITY): Payer: Self-pay

## 2013-10-29 ENCOUNTER — Ambulatory Visit (INDEPENDENT_AMBULATORY_CARE_PROVIDER_SITE_OTHER): Payer: Commercial Managed Care - HMO

## 2013-10-29 VITALS — BP 119/65 | HR 69 | Resp 16 | Ht 65.5 in | Wt 270.0 lb

## 2013-10-29 DIAGNOSIS — M7660 Achilles tendinitis, unspecified leg: Secondary | ICD-10-CM

## 2013-10-29 DIAGNOSIS — M79673 Pain in unspecified foot: Secondary | ICD-10-CM

## 2013-10-29 DIAGNOSIS — M79609 Pain in unspecified limb: Secondary | ICD-10-CM

## 2013-10-29 DIAGNOSIS — R609 Edema, unspecified: Secondary | ICD-10-CM

## 2013-10-29 DIAGNOSIS — M7730 Calcaneal spur, unspecified foot: Secondary | ICD-10-CM

## 2013-10-29 DIAGNOSIS — B351 Tinea unguium: Secondary | ICD-10-CM

## 2013-10-29 DIAGNOSIS — L600 Ingrowing nail: Secondary | ICD-10-CM

## 2013-10-29 MED ORDER — HYDROCODONE-ACETAMINOPHEN 5-325 MG PO TABS: 1.0000 | ORAL_TABLET | ORAL | Status: DC | PRN

## 2013-10-29 NOTE — Progress Notes (Signed)
Subjective:    Patient ID: [name], female    DOB: 1950-08-15, 63 y.o.   MRN: [id number]  HPI Comments: "I have a heel problem and a toenail that is hurting me."  Patient c/o aching posterior heel left for about 1 week. The area is swollen and extremely painful when pressed. Tried soaking-helped some. PCP gave pain meds. Keeps covered with bandaid to cushion. Shoes rub and make walking uncomfortable.  Also, c/o thick, log, discolored toenail, 1st right, for several weeks. She had the toenail removed before, but now has grown back and pressing into her 2nd toe. Keeps bandaid over to protect 2nd toe.  Foot Pain Associated symptoms include a rash.      Review of Systems  HENT: Positive for sinus pressure.   Eyes: Positive for itching.  Respiratory: Positive for wheezing.   Cardiovascular: Positive for leg swelling.  Musculoskeletal: Positive for back pain and gait problem.  Skin: Positive for rash.  All other systems reviewed and are negative.       Objective:   Physical Exam Lower extremity exam as follows pedal pulses palpable DP plus one over 4 PT plus one over 4 bilateral Refill time 3 seconds all digits skin temperature warm turgor normal there is mild edema +1 bilateral no rubor pallor mild varicosities noted neurologically epicritic and proprioceptive sensations intact and symmetric bilateral there is pain on palpation posterior heel area and left heel there is prominence the Achilles tendon and retrocalcaneal hypertrophy noted on the left foot. Right foot has thickening proptosis and lateral deviation of the hallux nail plate patient previously avulsed nail procedures the nails thick brittle crumbly friable discolored and darkened consistent with onychomycosis the right hallux is painful tender symptomatic and growing into the adjacent second digit to. X-rays revealed this time well-developed retrocalcaneal spur no signs of fracture or a small inferior to heal spurring  of the left foot no osseous abnormalities no other cyst or tumors noted on palpation left there is some intact Achilles tendon strength there is no sign of rupture are definite tenderness on direct lateral compression Achilles tendon insertion and retrocalcaneal spur osteophyte. Patient been taking medication for pain thoroughly wearing a pair slip on sandals cannot tolerate anything of the heel the remainder orthopedic exam unremarkable rectus foot mild digital contractures noted on the right foot there is again thickening proptosis of nails normal plantar response DTRs not listed       Assessment & Plan:  Assessment #1 Achilles tendinitis/tendinosis retrocalcaneal spurring left heel. Painful tender symptomatic been going on for several weeks and proptosis the past at this time patient placed in air fracture walker for mobilization also prescription for hydrocodone for pain is also dispensed did recommend warm compress ice pack to posterior heel area 2 or 3 times every day as instructed.  Assessment #2 ingrowing nail with onychomycosis right great toenail all remaining nails also show thickening. The proptosis incurvation friability of nails debridement this time over the right hallux nail painful tender symptomatic no signs of secondary infection however there severe deformity of nail painful tender with enclosed shoe wear and ambulation my recommendation per patient request systems nail excision and phenol matricectomy a local anesthetic block administered total of 3 cc 50-50 mixture of 2% Xylocaine plain and 0.5% Marcaine plain to the right great toe. Betadine prep performed the nail plate is avulsed phenol matricectomy followed by alcohol wash Silvadene cream and gauze dressing applied to the right great toe. Will do daily soaking  in Betadine or Epsom salts as instructed Neosporin and Band-Aid application daily after soaking also recommended undergo acetaminophen for pain I get soaking his stressed and  apply Neosporin or Polysporin for lumbar equipment recheck in 2 weeks for followup and nail check  Harriet Masson DPM

## 2013-10-29 NOTE — Patient Instructions (Signed)
ICE INSTRUCTIONS  Apply ice or cold pack to the affected area at least 3 times a day for 10-15 minutes each time.  You should also use ice after prolonged activity or vigorous exercise.  Do not apply ice longer than 20 minutes at one time.  Always keep a cloth between your skin and the ice pack to prevent burns.  Being consistent and following these instructions will help control your symptoms.  We suggest you purchase a gel ice pack because they are reusable and do bit leak.  Some of them are designed to wrap around the area.  Use the method that works best for you.  Here are some other suggestions for icing.   Use a frozen bag of peas or corn-inexpensive and molds well to your body, usually stays frozen for 10 to 20 minutes.  Wet a towel with cold water and squeeze out the excess until it's damp.  Place in a bag in the freezer for 20 minutes. Then remove and use.  Recommend alternate hot and cold compresses to put to the back of your left heel every day apply warm compress for 10 minutes the ice pack for 10 minutes repeat 2 or 3 times every evening     Betadine Soak Instructions  Purchase an 8 oz. bottle of BETADINE solution (Povidone)  THE DAY AFTER THE PROCEDURE  Place 1 tablespoon of betadine solution in a quart of warm tap water.  Submerge your foot or feet with outer bandage intact for the initial soak; this will allow the bandage to become moist and wet for easy lift off.  Once you remove your bandage, continue to soak in the solution for 20 minutes.  This soak should be done twice a day.  Next, remove your foot or feet from solution, blot dry the affected area and cover.  You may use a band aid large enough to cover the area or use gauze and tape.  Apply other medications to the area as directed by the doctor such as cortisporin otic solution (ear drops) or neosporin.  IF YOUR SKIN BECOMES IRRITATED WHILE USING THESE INSTRUCTIONS, IT IS OKAY TO SWITCH TO EPSOM SALTS AND WATER OR WHITE  VINEGAR AND WATER.

## 2013-10-30 ENCOUNTER — Other Ambulatory Visit: Payer: Self-pay | Admitting: Family Medicine

## 2013-10-31 NOTE — Telephone Encounter (Signed)
Refill appropriate and filled per protocol. 

## 2013-11-01 ENCOUNTER — Other Ambulatory Visit: Payer: Medicare HMO | Admitting: Family Medicine

## 2013-11-06 ENCOUNTER — Emergency Department (HOSPITAL_COMMUNITY)
Admission: EM | Admit: 2013-11-06 | Discharge: 2013-11-06 | Disposition: A | Discharge status: Home / Self Care | Payer: Medicare HMO | Attending: Emergency Medicine | Admitting: Emergency Medicine

## 2013-11-06 ENCOUNTER — Encounter (INDEPENDENT_AMBULATORY_CARE_PROVIDER_SITE_OTHER): Payer: Self-pay | Admitting: *Deleted

## 2013-11-06 ENCOUNTER — Emergency Department (HOSPITAL_COMMUNITY): Payer: Medicare HMO

## 2013-11-06 ENCOUNTER — Telehealth: Payer: Self-pay | Admitting: *Deleted

## 2013-11-06 ENCOUNTER — Encounter (HOSPITAL_COMMUNITY): Payer: Self-pay | Admitting: Emergency Medicine

## 2013-11-06 DIAGNOSIS — Z8719 Personal history of other diseases of the digestive system: Secondary | ICD-10-CM | POA: Insufficient documentation

## 2013-11-06 DIAGNOSIS — E662 Morbid (severe) obesity with alveolar hypoventilation: Secondary | ICD-10-CM | POA: Insufficient documentation

## 2013-11-06 DIAGNOSIS — Z87891 Personal history of nicotine dependence: Secondary | ICD-10-CM | POA: Insufficient documentation

## 2013-11-06 DIAGNOSIS — M109 Gout, unspecified: Secondary | ICD-10-CM | POA: Insufficient documentation

## 2013-11-06 DIAGNOSIS — IMO0002 Reserved for concepts with insufficient information to code with codable children: Secondary | ICD-10-CM | POA: Insufficient documentation

## 2013-11-06 DIAGNOSIS — I10 Essential (primary) hypertension: Secondary | ICD-10-CM | POA: Insufficient documentation

## 2013-11-06 DIAGNOSIS — J4489 Other specified chronic obstructive pulmonary disease: Secondary | ICD-10-CM | POA: Insufficient documentation

## 2013-11-06 DIAGNOSIS — M1990 Unspecified osteoarthritis, unspecified site: Secondary | ICD-10-CM | POA: Insufficient documentation

## 2013-11-06 DIAGNOSIS — E785 Hyperlipidemia, unspecified: Secondary | ICD-10-CM | POA: Insufficient documentation

## 2013-11-06 DIAGNOSIS — Z8659 Personal history of other mental and behavioral disorders: Secondary | ICD-10-CM | POA: Insufficient documentation

## 2013-11-06 DIAGNOSIS — Z7982 Long term (current) use of aspirin: Secondary | ICD-10-CM | POA: Insufficient documentation

## 2013-11-06 DIAGNOSIS — M7730 Calcaneal spur, unspecified foot: Secondary | ICD-10-CM | POA: Insufficient documentation

## 2013-11-06 DIAGNOSIS — J449 Chronic obstructive pulmonary disease, unspecified: Secondary | ICD-10-CM | POA: Insufficient documentation

## 2013-11-06 DIAGNOSIS — Z79899 Other long term (current) drug therapy: Secondary | ICD-10-CM | POA: Insufficient documentation

## 2013-11-06 MED ORDER — DICLOFENAC SODIUM 75 MG PO TBEC: 75.0000 mg | DELAYED_RELEASE_TABLET | Freq: Two times a day (BID) | ORAL | Status: DC

## 2013-11-06 MED ORDER — OXYCODONE-ACETAMINOPHEN 5-325 MG PO TABS: 1.0000 | ORAL_TABLET | Freq: Four times a day (QID) | ORAL | Status: DC | PRN

## 2013-11-06 MED ORDER — OXYCODONE-ACETAMINOPHEN 5-325 MG PO TABS
1.0000 | ORAL_TABLET | Freq: Once | ORAL | Status: AC
  Administered 2013-11-06: 1 via ORAL
  Filled 2013-11-06: qty 1

## 2013-11-06 MED ORDER — KETOROLAC TROMETHAMINE 60 MG/2ML IM SOLN
60.0000 mg | Freq: Once | INTRAMUSCULAR | Status: AC
  Administered 2013-11-06: 60 mg via INTRAMUSCULAR
  Filled 2013-11-06: qty 2

## 2013-11-06 NOTE — Telephone Encounter (Signed)
Returned call to patient.   States that her R foot is swollen. States that foot has started swelling over weekend.   Reports that she has been seen by podiatrist for L foot heel spurs, but now she is having pain in R foot.   Reports that she is in a great deal of pain and has used ice pack and pain medication to ease pain. States that ice and medication help a little bit.   MD please advise.

## 2013-11-06 NOTE — Discharge Instructions (Signed)
Heel Spur A heel spur is a hook of bone that can form on the calcaneus (the heel bone and the largest bone of the foot). Heel spurs are often associated with plantar fasciitis and usually come in people who have had the problem for an extended period of time. The cause of the relationship is unknown. The pain associated with them is thought to be caused by an inflammation (soreness and redness) of the plantar fascia rather than the spur itself. The plantar fascia is a thick fibrous like tissue that runs from the calcaneus (heel bone) to the ball of the foot. This strong, tight tissue helps maintain the arch of your foot. It helps distribute the weight across your foot as you walk or run. Stresses placed on the plantar fascia can be tremendous. When it is inflamed normal activities become painful. Pain is worse in the morning after sleeping. After sleeping the plantar fascia is tight. The first movements stretch the fascia and this causes pain. As the tendon loosens, the pain usually gets better. It often returns with too much standing or walking.  About 70% of patients with plantar fasciitis have a heel spur. About half of people without foot pain also have heel spurs. DIAGNOSIS  The diagnosis of a heel spur is made by X-ray. The X-ray shows a hook of bone protruding from the bottom of the calcaneus at the point where the plantar fascia is attached to the heel bone.  TREATMENT  It is necessary to find out what is causing the stretching of the plantar fascia. If the cause is over-pronation (flat feet), orthotics and proper foot ware may help.  Stretching exercises, losing weight, wearing shoes that have a cushioned heel that absorbs shock, and elevating the heel with the use of a heel cradle, heel cup, or orthotics may all help. Heel cradles and heel cups provide extra comfort and cushion to the heel, and reduce the amount of shock to the sore area. AVOIDING THE PAIN OF PLANTAR FASCIITIS AND HEEL  SPURS  Consult a sports medicine professional before beginning a new exercise program.  Walking programs offer a good workout. There is a lower chance of overuse injuries common to the runners. There is less impact and less jarring of the joints.  Begin all new exercise programs slowly. If problems or pains develop, decrease the amount of time or distance until you are at a comfortable level.  Wear good shoes and replace them regularly.  Stretch your foot and the heel cords at the back of the ankle (Achilles tendons) both before and after exercise.  Run or exercise on even surfaces that are not hard. For example, asphalt is better than pavement.  Do not run barefoot on hard surfaces.  If using a treadmill, vary the incline.  Do not continue to workout if you have foot or joint problems. Seek professional help if they do not improve. HOME CARE INSTRUCTIONS   Avoid activities that cause you pain until you recover.  Use ice or cold packs to the problem or painful areas after working out.  Only take over-the-counter or prescription medicines for pain, discomfort, or fever as directed by your caregiver.  Soft shoe inserts or athletic shoes with air or gel sole cushions may be helpful.  If problems continue or become more severe, consult a sports medicine caregiver. Cortisone is a potent anti-inflammatory medication that may be injected into the painful area. You can discuss this treatment with your caregiver. MAKE SURE YOU:  Understand these instructions.  Will watch your condition.  Will get help right away if you are not doing well or get worse. Document Released: 08/17/2005 Document Revised: 10/03/2011 Document Reviewed: 10/19/2005 [facility] Patient Information 2014 [place].

## 2013-11-06 NOTE — Telephone Encounter (Signed)
Please have pt, take lasix twice a day for the next 3 days, to help with swelling She can also take aleve or ibuprofen with food twice a day  Keep taking pain medication Make appt with me or foot doctor if not better

## 2013-11-06 NOTE — Telephone Encounter (Signed)
Message copied by Sheral Flow on Wed Nov 06, 2013 10:11 AM ------      Message from: Devoria Glassing      Created: Wed Nov 06, 2013  9:07 AM       Rose from [facility] calling to talk with you about this patient please call her at [phone] ------

## 2013-11-06 NOTE — Telephone Encounter (Signed)
Returned call to [facility] with [facility]   Reports that patient called in regards to bowel prep for colonoscopy this morning. Reports that she is unsure of process. Advised to call GI in regards to prep, but generally bowel prep needs more than one day.   Also reports that she has increased pain and swelling to R heel.   Advised that patient would need to call office to discuss foot issues.

## 2013-11-06 NOTE — ED Provider Notes (Signed)
CSN: [id number]     Arrival date & time 11/06/13  35 History   First MD Initiated Contact with Patient 11/06/13 1616     Chief Complaint  Patient presents with  . Foot Pain     (Consider location/radiation/quality/duration/timing/severity/associated sxs/prior Treatment) Patient is a 63 y.o. female presenting with lower extremity pain. The history is provided by the patient.  Foot Pain This is a new problem. Episode onset: 4 days. The problem occurs constantly. The problem has been gradually worsening. Associated symptoms include arthralgias. Pertinent negatives include no chills, fever, joint swelling, nausea, numbness, rash, sore throat, vomiting or weakness. The symptoms are aggravated by standing and walking. She has tried NSAIDs for the symptoms. The treatment provided moderate relief.    Patient reports pain to her right heel for four days.  States pain is worse with weight bearing.  She reports hx of heel spur to the left foot and states this pain is similar. She also has mild swelling to her foot and states pain radiates to the front of her foot when standing.  She has taken OTC medications w/o relief.  She denies redness, open wounds, rash or calf pain.    Past Medical History  Diagnosis Date  . Obesity hypoventilation syndrome   . Obstructive sleep apnea   . Osteoarthritis   . Low back pain   . HTN (hypertension)   . Hyperlipidemia   . GERD (gastroesophageal reflux disease)   . Depression   . COPD (chronic obstructive pulmonary disease)   . Anxiety   . Gout   . Achilles tendinitis    Past Surgical History  Procedure Laterality Date  . Tubal ligation    . Cardiac catheterization  2006   Family History  Problem Relation Age of Onset  . Heart disease Mother   . Thyroid disease Mother   . Heart failure Father   . Diabetes Brother   . Crohn's disease Daughter    History  Substance Use Topics  . Smoking status: Former Smoker -- 1.50 packs/day for 30 years   Types: Cigarettes    Quit date: 03/25/2005  . Smokeless tobacco: Never Used  . Alcohol Use: No   OB History   Grav Para Term Preterm Abortions TAB SAB Ect Mult Living   3 3 3       3      Review of Systems  Constitutional: Negative for fever and chills.  HENT: Negative for sore throat.   Gastrointestinal: Negative for nausea and vomiting.  Genitourinary: Negative for dysuria and difficulty urinating.  Musculoskeletal: Positive for arthralgias. Negative for back pain and joint swelling.       Right heel pain  Skin: Negative for color change, rash and wound.  Neurological: Negative for weakness and numbness.  All other systems reviewed and are negative.     Allergies  Review of patient's allergies indicates no known allergies.  Home Medications   Prior to Admission medications   Medication Sig Start Date End Date Taking? Authorizing Provider  aspirin 325 MG tablet Take 325 mg by mouth daily.     Historical Provider, MD  clotrimazole-betamethasone (LOTRISONE) cream Apply 1 application topically 2 (two) times daily as needed. To be applied to the feet as needed    Historical Provider, MD  colchicine 0.6 MG tablet TAKE 1 TABLET BY MOUTH EVERY DAY 09/03/13   Alycia Rossetti, MD  diclofenac (VOLTAREN) 75 MG EC tablet Take 1 tablet (75 mg total) by mouth 2 (two) times  daily. Take with food 11/06/13   Daevon Holdren L. Lijah Bourque, PA-C  fluticasone (CUTIVATE) 0.05 % cream Apply 1 application topically daily.  03/28/13   Historical Provider, MD  furosemide (LASIX) 40 MG tablet Take 1 tablet (40 mg total) by mouth daily. 08/02/13   Alycia Rossetti, MD  HYDROcodone-acetaminophen (NORCO/VICODIN) 5-325 MG per tablet Take 1 tablet by mouth every 4 (four) hours as needed for moderate pain. 10/29/13   Harriet Masson, DPM  lisinopril (PRINIVIL,ZESTRIL) 10 MG tablet TAKE 1 TABLET BY MOUTH DAILY    Alycia Rossetti, MD  methocarbamol (ROBAXIN) 500 MG tablet Take 1 tablet (500 mg total) by mouth 2 (two) times daily  as needed for muscle spasms. 09/30/13   Alycia Rossetti, MD  NON FORMULARY at bedtime. CPAP and 1 lpm oxygen qhs    Historical Provider, MD  oxyCODONE-acetaminophen (PERCOCET/ROXICET) 5-325 MG per tablet Take 1 tablet by mouth every 6 (six) hours as needed for severe pain. 11/06/13   Xzayvion Vaeth L. Ryla Cauthon, PA-C  simvastatin (ZOCOR) 20 MG tablet Take 1 tablet (20 mg total) by mouth at bedtime. 10/09/13   Alycia Rossetti, MD  tiotropium (SPIRIVA) 18 MCG inhalation capsule Place 1 capsule (18 mcg total) into inhaler and inhale daily. 08/12/13   Elsie Stain, MD   BP 126/56  Pulse 69  Temp(Src) 98.2 F (36.8 C) (Oral)  Resp 18  Ht 5\' 6"  (1.676 m)  Wt 281 lb (127.461 kg)  BMI 45.38 kg/m2  SpO2 98% Physical Exam  Nursing note and vitals reviewed. Constitutional: She is oriented to person, place, and time. She appears well-developed and well-nourished. No distress.  HENT:  Head: Normocephalic and atraumatic.  Cardiovascular: Normal rate, regular rhythm, normal heart sounds and intact distal pulses.   No murmur heard. Pulmonary/Chest: Effort normal and breath sounds normal. No respiratory distress.  Musculoskeletal: She exhibits edema and tenderness.  Localized ttp of the posterior right foot at insertion of the Achilles tendon.  No step off deformity of the tendon.  mild to moderate STS of the dorsal foot.  ROM is preserved.  DP pulse is palpable , distal sensation intact.  No erythema, abrasion, bruising or bony deformity.  No proximal tenderness. No calf pain or edema  Neurological: She is alert and oriented to person, place, and time. She exhibits normal muscle tone. Coordination normal.  Skin: Skin is warm and dry.    ED Course  Procedures (including critical care time) Labs Review Labs Reviewed - No data to display  Imaging Review Dg Foot Complete Right  11/06/2013   CLINICAL DATA:  Right foot pain distally and at plantar surface for 3 days, no known injury  EXAM: RIGHT FOOT COMPLETE -  3+ VIEW  COMPARISON:  04/23/2010  FINDINGS: Mild osseous demineralization.  Degenerative changes at first MTP joint with joint space narrowing and spur formation.  Remain joint spaces preserved.  Large Achilles insertion calcaneal spur.  Diffuse soft tissue swelling greatest at dorsum of foot.  No definite fracture, dislocation or bone destruction.  IMPRESSION: Degenerative changes first MTP joint.  Large Achilles insertion calcaneal spur.  Soft tissue swelling without acute osseous findings.   Electronically Signed   By: Lavonia Dana M.D.   On: 11/06/2013 14:50     EKG Interpretation None      MDM   Final diagnoses:  Heel spur    Patient with ttp at the site of the calcaneal spur.  NV intact.  No clinical signs of cellulitis of foot.  Pt sees [facility] and has a previously scheduled appt for later this month.   Post-op shoe applied.  Pt agrees to elevate, ice and close f/u with podiatry  Rx's for percocet #15 and diclofenac.  Pt appears stable for d/c  Brookelle Pellicane L. Vanessa Ferguson, PA-C 11/06/13 1715

## 2013-11-06 NOTE — Telephone Encounter (Signed)
Message copied by Sheral Flow on Wed Nov 06, 2013 10:45 AM ------      Message from: Devoria Glassing      Created: Wed Nov 06, 2013 10:28 AM       Patient is returning your call [phone] ------

## 2013-11-06 NOTE — Telephone Encounter (Signed)
Call placed to patient. No answer. 

## 2013-11-06 NOTE — ED Notes (Signed)
Pt co rt foot swelling with pain x4 days, pain started in back of heel. Denies injury.

## 2013-11-06 NOTE — ED Notes (Signed)
Pt c/o pain to r heel and top of foot.  Swelling noted to both feet but r worse than left.  Denies injury. Pedal pulse present.  Pt reports had toenail removed from r great toe a few days ago and says has not caused her any problems.  Yellowish color noted to nail bed of great toe.  Pt requesting to stay in wheelchair, states hurts to put weight on foot.

## 2013-11-07 NOTE — ED Provider Notes (Signed)
Medical screening examination/treatment/procedure(s) were performed by non-physician practitioner and as supervising physician I was immediately available for consultation/collaboration.   EKG Interpretation None        Tanna Furry, MD 11/07/13 0028

## 2013-11-11 NOTE — Telephone Encounter (Signed)
Placed call to patient and informed her of MD recommendation.   Patient states that swelling is caused by bone spurs in heels and she is taking anti-inflammatory NSAID's.  States she will F/U with podiatrist on 11/19/2013.

## 2013-11-12 ENCOUNTER — Ambulatory Visit (INDEPENDENT_AMBULATORY_CARE_PROVIDER_SITE_OTHER): Payer: Commercial Managed Care - HMO

## 2013-11-12 VITALS — BP 161/77 | HR 73 | Resp 18 | Ht 65.5 in | Wt 280.0 lb

## 2013-11-12 DIAGNOSIS — R609 Edema, unspecified: Secondary | ICD-10-CM

## 2013-11-12 DIAGNOSIS — M79673 Pain in unspecified foot: Secondary | ICD-10-CM

## 2013-11-12 DIAGNOSIS — M79609 Pain in unspecified limb: Secondary | ICD-10-CM

## 2013-11-12 DIAGNOSIS — M7660 Achilles tendinitis, unspecified leg: Secondary | ICD-10-CM

## 2013-11-12 DIAGNOSIS — M7730 Calcaneal spur, unspecified foot: Secondary | ICD-10-CM

## 2013-11-12 NOTE — Progress Notes (Signed)
   Subjective:    Patient ID: [name], female    DOB: 1951/05/23, 63 y.o.   MRN: [id number]  HPI Comments: N heel pain L right posterior heel D 11/03/2013 Sunday O C swelling, and pain A worse in the morning [place], prescribed Voltaren, injected pain medication into buttock, and gave a surgical shoe.  Foot Pain      Review of Systems  All other systems reviewed and are negative.  no new systemic findings or changes noted     Objective:   Physical Exam Neurovascular status is intact and unchanged pedal pulses palpable epicritic and progressive sensations intact right hallux nail that has healed and resolved there is no discharge or drainage doing well the left posterior heel has improved no pain or effusion symptoms noted hours and she was here last and developed heel pain of her right heel now posterior right heel areas painful symptomatic presents with copies of x-rays from [facility] depicting a large posterior spur and osteophyte this is consistent correlates with a similar spurring on her left heel. At this time there is pain on palpation pain on dorsiflexion of the foot she is wearing a Darco shoe however I advised her she can use her air fracture boot there was using her left foot to immobilize the right one is well to maintain the boot for the next 3-4 weeks as instructed. She to remove it and were Darco shoe only for driving otherwise maintain the cam walker at all other times.       Assessment & Plan:  Assessments #1 resolved are improved Achilles tendinitis and retrocalcaneal spurring left heel next  Assessment #2 is improved her resolving AP nail procedure right great toe no discharge or drainage no pain tenderness discomfort may discontinue treatments of the nail at this time.  Assessment #3 new problem of Achilles tendinitis and retrocalcaneal spurring of the right posterior heel patient is seen at any time hospital and given  a steroid injection and anti-inflammatory patient is also placed in a Darco shoe at this time my recommendation is to use the air fracture boot which she currently has for her left foot using on her right foot to mobilize the right heel and Achilles tendon patient will be recheck in one month if fails to resolve or improve maintain pain medications as needed patient was asked to share planus/indications plenty of medication at home followup in one month if no improvement  Harriet Masson DPM

## 2013-11-12 NOTE — Patient Instructions (Signed)
Achilles Tendinitis Achilles tendinitis is inflammation of the tough, cord-like band that attaches the lower muscles of your leg to your heel (Achilles tendon). It is usually caused by overusing the tendon and joint involved.  CAUSES Achilles tendinitis can happen because of:  A sudden increase in exercise or activity (such as running).  Doing the same exercises or activities (such as jumping) over and over.  Not warming up calf muscles before exercising.  Exercising in shoes that are worn out or not made for exercise.  Having arthritis or a bone growth on the back of the heel bone. This can rub against the tendon and hurt the tendon. SIGNS AND SYMPTOMS The most common symptoms are:  Pain in the back of the leg, just above the heel. The pain usually gets worse with exercise and better with rest.  Stiffness or soreness in the back of the leg, especially in the morning.  Swelling of the skin over the Achilles tendon.  Trouble standing on tiptoe. Sometimes, an Achilles tendon tears (ruptures). Symptoms of an Achilles tendon rupture can include:  Sudden, severe pain in the back of the leg.  Trouble putting weight on the foot or walking normally. DIAGNOSIS Achilles tendinitis will be diagnosed based on symptoms and a physical examination. An X-ray may be done to check if another condition is causing your symptoms. An MRI may be ordered if your health care provider suspects you may have completely torn your tendon, which is called an Achilles tendon rupture.  TREATMENT  Achilles tendinitis usually gets better over time. It can take weeks to months to heal completely. Treatment focuses on treating the symptoms and helping the injury heal. HOME CARE INSTRUCTIONS   Rest your Achilles tendon and avoid activities that cause pain.  Apply ice to the injured area:  Put ice in a plastic bag.  Place a towel between your skin and the bag.  Leave the ice on for 20 minutes, 2 3 times a  day  Try to avoid using the tendon (other than gentle range of motion) while the tendon is painful. Do not resume use until instructed by your health care provider. Then begin use gradually. Do not increase use to the point of pain. If pain does develop, decrease use and continue the above measures. Gradually increase activities that do not cause discomfort until you achieve normal use.  Do exercises to make your calf muscles stronger and more flexible. Your health care provider or physical therapist can recommend exercises for you to do.  Wrap your ankle with an elastic bandage or other wrap. This can help keep your tendon from moving too much. Your health care provider will show you how to wrap your ankle correctly.  Only take over-the-counter or prescription medicines for pain, discomfort, or fever as directed by your health care provider. SEEK MEDICAL CARE IF:   Your pain and swelling increase or pain is uncontrolled with medicines.  You develop new, unexplained symptoms or your symptoms get worse.  You are unable to move your toes or foot.  You develop warmth and swelling in your foot.  You have an unexplained temperature. MAKE SURE YOU:   Understand these instructions.  Will watch your condition.  Will get help right away if you are not doing well or get worse. Document Released: 04/20/2005 Document Revised: 05/01/2013 Document Reviewed: 02/20/2013 [facility] Patient Information 2014 [place].  Maintain ice pack to the back of the heel and maintain air fracture boot for the right heel  for 3-4 weeks until the right foot: Is down and improves just like the left

## 2013-11-19 ENCOUNTER — Encounter: Payer: Self-pay | Admitting: Family Medicine

## 2013-11-19 ENCOUNTER — Ambulatory Visit (INDEPENDENT_AMBULATORY_CARE_PROVIDER_SITE_OTHER): Payer: Commercial Managed Care - HMO | Admitting: Family Medicine

## 2013-11-19 VITALS — BP 136/90 | HR 74 | Temp 98.1°F | Resp 18 | Ht 64.0 in | Wt 290.0 lb

## 2013-11-19 DIAGNOSIS — I10 Essential (primary) hypertension: Secondary | ICD-10-CM

## 2013-11-19 DIAGNOSIS — Z124 Encounter for screening for malignant neoplasm of cervix: Secondary | ICD-10-CM

## 2013-11-19 DIAGNOSIS — M1990 Unspecified osteoarthritis, unspecified site: Secondary | ICD-10-CM

## 2013-11-19 DIAGNOSIS — Z0000 Encounter for general adult medical examination without abnormal findings: Secondary | ICD-10-CM

## 2013-11-19 DIAGNOSIS — E785 Hyperlipidemia, unspecified: Secondary | ICD-10-CM

## 2013-11-19 DIAGNOSIS — Z780 Asymptomatic menopausal state: Secondary | ICD-10-CM

## 2013-11-19 LAB — CBC WITH DIFFERENTIAL/PLATELET
BASOS PCT: 1 % (ref 0–1)
Basophils Absolute: 0.1 10*3/uL (ref 0.0–0.1)
Eosinophils Absolute: 0.5 10*3/uL (ref 0.0–0.7)
Eosinophils Relative: 7 % — ABNORMAL HIGH (ref 0–5)
HCT: 37.6 % (ref 36.0–46.0)
Hemoglobin: 12.2 g/dL (ref 12.0–15.0)
Lymphocytes Relative: 35 % (ref 12–46)
Lymphs Abs: 2.4 10*3/uL (ref 0.7–4.0)
MCH: 28.2 pg (ref 26.0–34.0)
MCHC: 32.4 g/dL (ref 30.0–36.0)
MCV: 87 fL (ref 78.0–100.0)
Monocytes Absolute: 0.5 10*3/uL (ref 0.1–1.0)
Monocytes Relative: 8 % (ref 3–12)
NEUTROS PCT: 49 % (ref 43–77)
Neutro Abs: 3.3 10*3/uL (ref 1.7–7.7)
PLATELETS: 271 10*3/uL (ref 150–400)
RBC: 4.32 MIL/uL (ref 3.87–5.11)
RDW: 14.7 % (ref 11.5–15.5)
WBC: 6.8 10*3/uL (ref 4.0–10.5)

## 2013-11-19 LAB — BASIC METABOLIC PANEL
BUN: 11 mg/dL (ref 6–23)
CO2: 31 meq/L (ref 19–32)
CREATININE: 0.85 mg/dL (ref 0.50–1.10)
Calcium: 8.7 mg/dL (ref 8.4–10.5)
Chloride: 103 mEq/L (ref 96–112)
Glucose, Bld: 86 mg/dL (ref 70–99)
Potassium: 4.5 mEq/L (ref 3.5–5.3)
SODIUM: 142 meq/L (ref 135–145)

## 2013-11-19 MED ORDER — HYDROCODONE-ACETAMINOPHEN 7.5-325 MG PO TABS: 1.0000 | ORAL_TABLET | Freq: Four times a day (QID) | ORAL | Status: DC | PRN

## 2013-11-19 NOTE — Assessment & Plan Note (Signed)
Blood pressure looks okay continue current medications 

## 2013-11-19 NOTE — Progress Notes (Signed)
Patient ID: [name], female   DOB: [birth year], 63 y.o.   MRN: [id number] Subjective:   Patient presents for Medicare Annual/Subsequent preventive examination.  No specific concerns today. She reports her pain medications be refilled she was seen by the emergency room as well as podiatry for her heel pain she is noted to have large heel spurs bilaterally which they're holding off on surgery secondary to risks and complications from surgery. She is taking hydrocodone as prescribed has one pill left. She's taking all of her other medications with exception of the simvastatin states she does not have enough money to get this at this time. Colonoscopy has been scheduled for June her mammogram has been done. She's not had a bone density     Review Past Medical/Family/Social: Per EMR   Risk Factors  Current exercise habits: None Dietary issues discussed: Yes  Cardiac risk factors: Obesity (BMI >= 30 kg/m2).   Depression Screen  (Note: if answer to either of the following is "Yes", a more complete depression screening is indicated)  Over the past two weeks, have you felt down, depressed or hopeless? No Over the past two weeks, have you felt little interest or pleasure in doing things? No Have you lost interest or pleasure in daily life? No Do you often feel hopeless? No Do you cry easily over simple problems? No   Activities of Daily Living  In your present state of health, do you have any difficulty performing the following activities?:  Driving? No  Managing money? No  Feeding yourself? No  Getting from bed to chair? No  Climbing a flight of stairs? YES  Preparing food and eating?: No  Bathing or showering? No  Getting dressed: No  Getting to the toilet? No  Using the toilet:No  Moving around from place to place: No  In the past year have you fallen or had a near fall?:No  Are you sexually active? No  Do you have more than one partner? No   Hearing Difficulties: No  Do  you often ask people to speak up or repeat themselves? No  Do you experience ringing or noises in your ears? No Do you have difficulty understanding soft or whispered voices? No  Do you feel that you have a problem with memory? No Do you often misplace items? No  Do you feel safe at home? Yes  Cognitive Testing  Alert? Yes Normal Appearance?Yes  Oriented to person? Yes Place? Yes  Time? Yes  Recall of three objects? Yes  Can perform simple calculations? Yes  Displays appropriate judgment?Yes  Can read the correct time from a watch face?Yes   List the Names of Other Physician/Practitioners you currently use: Podiatry    Screening Tests / Date Colonoscopy   -pending                  Zostavax - script given Mammogram UTD Tetanus/tdap- UTD  ROS: GEN- denies fatigue, fever, weight loss,weakness, recent illness HEENT- denies eye drainage, change in vision, nasal discharge, CVS- denies chest pain, palpitations RESP- denies SOB, cough, wheeze ABD- denies N/V, change in stools, abd pain GU- denies dysuria, hematuria, dribbling, incontinence MSK- + joint pain, muscle aches, injury Neuro- denies headache, dizziness, syncope, seizure activity   GEN- NAD, alert and oriented x3 HEENT- PERRL, EOMI, non injected sclera, pink conjunctiva, MMM, oropharynx clear, TM clear bilat Neck- supple, no thyromegaly Breast- normal symmetry, no nipple inversion,no nipple drainage, no nodules or lumps felt Nodes- no axillary nodes  CVS- RRR, no murmur RESP-CTAB ABD-NABS,soft,NT,ND GU- normal external genitalia, vaginal mucosa pink and moist, cervix visualized no growth- atrophy noted, no blood form os, minimal thin clear discharge, no CMT, no ovarian masses, uterus difficult to palpate due to habitus, urethra normal position, rectum normal tone no external lesions, FOBT neg EXT- No edema Pulses- Radial, DP- 2+     Assessment:    Annual wellness medicare exam   Plan:    During the course of  the visit the patient was educated and counseled about appropriate screening and preventive services including:  Screening mammography  Colorectal cancer screening  Shingles vaccine. Prescription given to that she can get the vaccine at the pharmacy or Medicare part D.    Diet review for nutrition referral? Yes ____ Not Indicated __x__  Patient Instructions (the written plan) was given to the patient.  Medicare Attestation  I have personally reviewed:  The patient's medical and social history  Their use of alcohol, tobacco or illicit drugs  Their current medications and supplements  The patient's functional ability including ADLs,fall risks, home safety risks, cognitive, and hearing and visual impairment  Diet and physical activities  Evidence for depression or mood disorders  The patient's weight, height, BMI, and visual acuity have been recorded in the chart. I have made referrals, counseling, and provided education to the patient based on review of the above and I have provided the patient with a written personalized care plan for preventive services.

## 2013-11-19 NOTE — Assessment & Plan Note (Signed)
Finances are difficult for her chart he hasTHN working with her. Advised her to start a cholesterol

## 2013-11-19 NOTE — Patient Instructions (Signed)
I recommend eye visit once a year I recommend dental visit every 6 months Goal is to  Exercise 30 minutes 5 days a week We will send a letter with lab results  Colonoscopy to be done Bone Density to be set up Start the cholesterol medication- simvastatin F/U 4 months

## 2013-11-19 NOTE — Assessment & Plan Note (Signed)
Colonoscopy pending Bone Density to be done Given script for shingles vaccine

## 2013-11-19 NOTE — Assessment & Plan Note (Signed)
Heel spurs, OA, chronic back pain Given script for hydrocodone 7.5mg  prn

## 2013-11-20 LAB — PAP THINPREP ASCUS RFLX HPV RFLX TYPE

## 2013-11-21 ENCOUNTER — Encounter: Payer: Self-pay | Admitting: *Deleted

## 2013-11-26 ENCOUNTER — Other Ambulatory Visit: Payer: Self-pay | Admitting: Family Medicine

## 2013-11-26 ENCOUNTER — Other Ambulatory Visit: Payer: Self-pay | Admitting: *Deleted

## 2013-11-26 MED ORDER — FLUTICASONE PROPIONATE 0.05 % EX CREA: 1.0000 "application " | TOPICAL_CREAM | Freq: Every day | CUTANEOUS | Status: DC

## 2013-11-26 NOTE — Telephone Encounter (Signed)
Refill appropriate and filled per protocol. 

## 2013-11-27 ENCOUNTER — Telehealth: Payer: Self-pay | Admitting: Critical Care Medicine

## 2013-11-27 DIAGNOSIS — J439 Emphysema, unspecified: Secondary | ICD-10-CM

## 2013-11-27 NOTE — Telephone Encounter (Signed)
Attempted to call pt but no answer and no machine.  Will try back later.

## 2013-11-28 NOTE — Telephone Encounter (Signed)
I spoke with the pt and she states that [employer] is no longer accepting her insurance. I spoke with [place] [facility] and she states [facility] will accept [employer]. Verified with the pt that she wears 1 liter oxygen at bedtime. Order sent. [place] Bing, CMA

## 2013-12-03 ENCOUNTER — Other Ambulatory Visit: Payer: Self-pay | Admitting: Family Medicine

## 2013-12-03 ENCOUNTER — Telehealth: Payer: Self-pay | Admitting: Critical Care Medicine

## 2013-12-03 NOTE — Telephone Encounter (Signed)
Ok to refill?  Prescribed on 09/30/2013 for short term relief of back pain.

## 2013-12-03 NOTE — Telephone Encounter (Signed)
Spoke with Wa[place]a. She is faxing this over to triage. Will await fax

## 2013-12-03 NOTE — Telephone Encounter (Signed)
Spoke with pt. She reports [name] is going to send [place] a fax to sign for her O2.  I called [facility] to see what they are needing. Received message the office is currently closed and received on call rep. I advised [facility] after lunch

## 2013-12-03 NOTE — Telephone Encounter (Signed)
okay

## 2013-12-03 NOTE — Telephone Encounter (Signed)
Fax has been received and placed in PW look at. Will forward to [place] and PW so they are aware.

## 2013-12-03 NOTE — Telephone Encounter (Signed)
Prescription sent to pharmacy.

## 2013-12-04 NOTE — Telephone Encounter (Signed)
noted 

## 2013-12-05 NOTE — Telephone Encounter (Signed)
Form signed by PW.  I have faxed it back to [place]. Pt aware. Arbie Cookey with [facility] aware.

## 2013-12-10 ENCOUNTER — Telehealth (INDEPENDENT_AMBULATORY_CARE_PROVIDER_SITE_OTHER): Payer: Self-pay | Admitting: *Deleted

## 2013-12-10 NOTE — Telephone Encounter (Signed)
agree

## 2013-12-10 NOTE — Telephone Encounter (Signed)
  Procedure: tcs  Reason/Indication:  screening  Has patient had this procedure before?  no  If so, when, by whom and where?    Is there a family history of colon cancer?  no  Who?  What age when diagnosed?    Is patient diabetic?   no      Does patient have prosthetic heart valve?  no  Do you have a pacemaker?  no  Has patient ever had endocarditis? no  Has patient had joint replacement within last 12 months?  no  Does patient tend to be constipated or take laxatives? ocassionally  Is patient on Coumadin, Plavix and/or Aspirin? yes  Medications: see EPIC  Allergies: nkda  Medication Adjustment: asa 2 days  Procedure date & time: 12/25/13 at 730

## 2013-12-24 ENCOUNTER — Other Ambulatory Visit (INDEPENDENT_AMBULATORY_CARE_PROVIDER_SITE_OTHER): Payer: Self-pay | Admitting: *Deleted

## 2013-12-24 DIAGNOSIS — Z1211 Encounter for screening for malignant neoplasm of colon: Secondary | ICD-10-CM

## 2013-12-25 ENCOUNTER — Encounter (HOSPITAL_COMMUNITY): Payer: Self-pay

## 2013-12-25 ENCOUNTER — Telehealth: Payer: Self-pay | Admitting: Family Medicine

## 2013-12-25 ENCOUNTER — Ambulatory Visit (HOSPITAL_COMMUNITY)
Admission: RE | Admit: 2013-12-25 | Discharge: 2013-12-25 | Disposition: A | Discharge status: Home / Self Care | Payer: Medicare HMO | Source: Ambulatory Visit | Attending: Internal Medicine | Admitting: Internal Medicine

## 2013-12-25 ENCOUNTER — Encounter (HOSPITAL_COMMUNITY)
Admission: RE | Disposition: A | Discharge status: Home / Self Care | Payer: Self-pay | Source: Ambulatory Visit | Attending: Internal Medicine

## 2013-12-25 DIAGNOSIS — I10 Essential (primary) hypertension: Secondary | ICD-10-CM | POA: Insufficient documentation

## 2013-12-25 DIAGNOSIS — J4489 Other specified chronic obstructive pulmonary disease: Secondary | ICD-10-CM | POA: Insufficient documentation

## 2013-12-25 DIAGNOSIS — K620 Anal polyp: Secondary | ICD-10-CM | POA: Insufficient documentation

## 2013-12-25 DIAGNOSIS — Z6841 Body Mass Index (BMI) 40.0 and over, adult: Secondary | ICD-10-CM | POA: Insufficient documentation

## 2013-12-25 DIAGNOSIS — Z87891 Personal history of nicotine dependence: Secondary | ICD-10-CM | POA: Insufficient documentation

## 2013-12-25 DIAGNOSIS — D126 Benign neoplasm of colon, unspecified: Secondary | ICD-10-CM

## 2013-12-25 DIAGNOSIS — K621 Rectal polyp: Secondary | ICD-10-CM

## 2013-12-25 DIAGNOSIS — M1990 Unspecified osteoarthritis, unspecified site: Secondary | ICD-10-CM | POA: Insufficient documentation

## 2013-12-25 DIAGNOSIS — J449 Chronic obstructive pulmonary disease, unspecified: Secondary | ICD-10-CM | POA: Insufficient documentation

## 2013-12-25 DIAGNOSIS — F329 Major depressive disorder, single episode, unspecified: Secondary | ICD-10-CM | POA: Insufficient documentation

## 2013-12-25 DIAGNOSIS — Z1211 Encounter for screening for malignant neoplasm of colon: Secondary | ICD-10-CM

## 2013-12-25 DIAGNOSIS — Z7982 Long term (current) use of aspirin: Secondary | ICD-10-CM | POA: Insufficient documentation

## 2013-12-25 DIAGNOSIS — G4733 Obstructive sleep apnea (adult) (pediatric): Secondary | ICD-10-CM | POA: Insufficient documentation

## 2013-12-25 DIAGNOSIS — Z79899 Other long term (current) drug therapy: Secondary | ICD-10-CM | POA: Insufficient documentation

## 2013-12-25 DIAGNOSIS — E662 Morbid (severe) obesity with alveolar hypoventilation: Secondary | ICD-10-CM | POA: Insufficient documentation

## 2013-12-25 DIAGNOSIS — E785 Hyperlipidemia, unspecified: Secondary | ICD-10-CM | POA: Insufficient documentation

## 2013-12-25 DIAGNOSIS — F411 Generalized anxiety disorder: Secondary | ICD-10-CM | POA: Insufficient documentation

## 2013-12-25 DIAGNOSIS — K219 Gastro-esophageal reflux disease without esophagitis: Secondary | ICD-10-CM | POA: Insufficient documentation

## 2013-12-25 DIAGNOSIS — F3289 Other specified depressive episodes: Secondary | ICD-10-CM | POA: Insufficient documentation

## 2013-12-25 DIAGNOSIS — M109 Gout, unspecified: Secondary | ICD-10-CM | POA: Insufficient documentation

## 2013-12-25 HISTORY — PX: COLONOSCOPY: SHX5424

## 2013-12-25 SURGERY — COLONOSCOPY
Anesthesia: Moderate Sedation

## 2013-12-25 MED ORDER — MEPERIDINE HCL 50 MG/ML IJ SOLN
INTRAMUSCULAR | Status: AC
  Filled 2013-12-25: qty 1

## 2013-12-25 MED ORDER — SODIUM CHLORIDE 0.9 % IV SOLN
INTRAVENOUS | Status: DC
  Administered 2013-12-25: 08:00:00 via INTRAVENOUS

## 2013-12-25 MED ORDER — MIDAZOLAM HCL 5 MG/5ML IJ SOLN
INTRAMUSCULAR | Status: DC | PRN
  Administered 2013-12-25: 1 mg via INTRAVENOUS
  Administered 2013-12-25: 2 mg via INTRAVENOUS

## 2013-12-25 MED ORDER — SIMETHICONE 40 MG/0.6ML PO SUSP
ORAL | Status: AC
  Filled 2013-12-25: qty 0.6

## 2013-12-25 MED ORDER — STERILE WATER FOR IRRIGATION IR SOLN
Status: DC | PRN
  Administered 2013-12-25: 08:00:00

## 2013-12-25 MED ORDER — HYDROCODONE-ACETAMINOPHEN 7.5-325 MG PO TABS: 1.0000 | ORAL_TABLET | Freq: Four times a day (QID) | ORAL | Status: DC | PRN

## 2013-12-25 MED ORDER — MIDAZOLAM HCL 5 MG/5ML IJ SOLN
INTRAMUSCULAR | Status: DC
Start: 2013-12-25 — End: 2013-12-25
  Filled 2013-12-25: qty 10

## 2013-12-25 MED ORDER — MEPERIDINE HCL 50 MG/ML IJ SOLN
INTRAMUSCULAR | Status: DC | PRN
  Administered 2013-12-25: 25 mg via INTRAVENOUS
  Administered 2013-12-25: 15 mg via INTRAVENOUS

## 2013-12-25 NOTE — Telephone Encounter (Signed)
Prescription printed and patient made aware to come to office to pick up.  

## 2013-12-25 NOTE — H&P (Signed)
[name] is an 63 y.o. female.   Chief Complaint: Patient is  here for colonoscopy. HPI: Patient is 63 -year-old [place] American female who is here for screening colonoscopy. Patient denies rectal bleeding or change in her bowel habits. She has sporadic periumblical but it does not last long. This is patient's first exam. Family history is negative for CRC.  Past Medical History  Diagnosis Date  . Obesity hypoventilation syndrome   . Obstructive sleep apnea   . Osteoarthritis   . Low back pain   . HTN (hypertension)   . Hyperlipidemia   . GERD (gastroesophageal reflux disease)   . Depression   . COPD (chronic obstructive pulmonary disease)   . Anxiety   . Gout   . Achilles tendinitis     Past Surgical History  Procedure Laterality Date  . Tubal ligation    . Cardiac catheterization  2006    Family History  Problem Relation Age of Onset  . Heart disease Mother   . Thyroid disease Mother   . Heart failure Father   . Diabetes Brother   . Crohn's disease Daughter    Social History:  reports that she quit smoking about 8 years ago. Her smoking use included Cigarettes. She has a 45 pack-year smoking history. She has never used smokeless tobacco. She reports that she does not drink alcohol or use illicit drugs.  Allergies: No Known Allergies  Medications Prior to Admission  Medication Sig Dispense Refill  . aspirin 325 MG tablet Take 325 mg by mouth daily.       . colchicine 0.6 MG tablet TAKE 1 TABLET BY MOUTH EVERY DAY  30 tablet  1  . fluticasone (CUTIVATE) 0.05 % cream Apply 1 application topically daily.  30 g  2  . furosemide (LASIX) 40 MG tablet Take 1 tablet (40 mg total) by mouth daily.  30 tablet  3  . HYDROcodone-acetaminophen (NORCO) 7.5-325 MG per tablet Take 1 tablet by mouth every 6 (six) hours as needed for moderate pain.  45 tablet  0  . lisinopril (PRINIVIL,ZESTRIL) 10 MG tablet TAKE 1 TABLET BY MOUTH DAILY  30 tablet  5  . methocarbamol (ROBAXIN)  500 MG tablet TAKE 1 TABLET BY MOUTH TWICE DAILY AS NEEDED FOR MUSCLE SPASMS  30 tablet  0  . NON FORMULARY at bedtime. CPAP and 1 lpm oxygen qhs      . tiotropium (SPIRIVA) 18 MCG inhalation capsule Place 1 capsule (18 mcg total) into inhaler and inhale daily.  30 capsule  6  . clotrimazole-betamethasone (LOTRISONE) cream Apply 1 application topically 2 (two) times daily as needed. To be applied to the feet as needed      . simvastatin (ZOCOR) 20 MG tablet Take 1 tablet (20 mg total) by mouth at bedtime.  90 tablet  3    No results found for this or any previous visit (from the past 48 hour(s)). No results found.  ROS  Blood pressure 138/65, pulse 90, temperature 98.5 F (36.9 C), temperature source Oral, resp. rate 21, height 5\' 6"  (1.676 m), weight 280 lb (127.007 kg), SpO2 95.00%. Physical Exam  Constitutional:  Well-developed obese African American female in NAD  HENT:  Mouth/Throat: Oropharynx is clear and moist.  Eyes: Conjunctivae are normal. No scleral icterus.  Neck: No thyromegaly present.  Cardiovascular: Normal rate, regular rhythm and normal heart sounds.   No murmur heard. Respiratory: Effort normal and breath sounds normal.  GI: Soft. She exhibits no distension  and no mass. There is no tenderness.  Musculoskeletal: Edema:  trace edema around ankles.  Lymphadenopathy:    She has no cervical adenopathy.  Neurological: She is alert.  Skin: Skin is warm and dry.     Assessment/Plan Average risk screening colonoscopy.  Rogene Houston 12/25/2013, 7:43 AM

## 2013-12-25 NOTE — Telephone Encounter (Signed)
Ok to refill??  Last office visit/ refill 11/19/2013.

## 2013-12-25 NOTE — Discharge Instructions (Signed)
Resume aspirin on 12/27/2013. Resume other medications as before. Resume usual diet. No driving for 24 hours. Physician will call with biopsy results.  Colonoscopy Care After These instructions give you information on caring for yourself after your procedure. Your doctor may also give you more specific instructions. Call your doctor if you have any problems or questions after your procedure. HOME CARE  Take it easy for the next 24 hours.  Rest.  Walk or use warm packs on your belly (abdomen) if you have belly cramping or gas.  Do not drive for 24 hours.  You may shower.  Do not sign important papers or use machinery for 24 hours.  Drink enough fluids to keep your pee (urine) clear or pale yellow.  Resume your normal diet. Avoid heavy or fried foods.  Avoid alcohol.  Continue taking your normal medicines.  Only take medicine as told by your doctor. Do not take aspirin. If you had growths (polyps) removed:  Do not take aspirin.  Do not drink alcohol for 7 days or as told by your doctor.  Eat a soft diet for 24 hours. GET HELP RIGHT AWAY IF:  You have a fever.  You pass clumps of tissue (blood clots) or fill the toilet with blood.  You have belly pain that gets worse and medicine does not help.  Your belly is puffy (swollen).  You feel sick to your stomach (nauseous) or throw up (vomit). MAKE SURE YOU:  Understand these instructions.  Will watch your condition.  Will get help right away if you are not doing well or get worse. Document Released: 08/13/2010 Document Revised: 10/03/2011 Document Reviewed: 03/18/2013 [facility] Patient Information 2014 [place].   Colon Polyps Polyps are lumps of extra tissue growing inside the body. Polyps can grow in the large intestine (colon). Most colon polyps are noncancerous (benign). However, some colon polyps can become cancerous over time. Polyps that are larger than a pea may be harmful. To be safe, caregivers  remove and test all polyps. CAUSES  Polyps form when mutations in the genes cause your cells to grow and divide even though no more tissue is needed. RISK FACTORS There are a number of risk factors that can increase your chances of getting colon polyps. They include:  Being older than 50 years.  Family history of colon polyps or colon cancer.  Long-term colon diseases, such as colitis or Crohn disease.  Being overweight.  Smoking.  Being inactive.  Drinking too much alcohol. SYMPTOMS  Most small polyps do not cause symptoms. If symptoms are present, they may include:  Blood in the stool. The stool may look dark red or black.  Constipation or diarrhea that lasts longer than 1 week. DIAGNOSIS People often do not know they have polyps until their caregiver finds them during a regular checkup. Your caregiver can use 4 tests to check for polyps:  Digital rectal exam. The caregiver wears gloves and feels inside the rectum. This test would find polyps only in the rectum.  Barium enema. The caregiver puts a liquid called barium into your rectum before taking X-rays of your colon. Barium makes your colon look white. Polyps are dark, so they are easy to see in the X-ray pictures.  Sigmoidoscopy. A thin, flexible tube (sigmoidoscope) is placed into your rectum. The sigmoidoscope has a light and tiny camera in it. The caregiver uses the sigmoidoscope to look at the last third of your colon.  Colonoscopy. This test is like sigmoidoscopy, but the caregiver  looks at the entire colon. This is the most common method for finding and removing polyps. TREATMENT  Any polyps will be removed during a sigmoidoscopy or colonoscopy. The polyps are then tested for cancer. PREVENTION  To help lower your risk of getting more colon polyps:  Eat plenty of fruits and vegetables. Avoid eating fatty foods.  Do not smoke.  Avoid drinking alcohol.  Exercise every day.  Lose weight if recommended by your  caregiver.  Eat plenty of calcium and folate. Foods that are rich in calcium include milk, cheese, and broccoli. Foods that are rich in folate include chickpeas, kidney beans, and spinach. HOME CARE INSTRUCTIONS Keep all follow-up appointments as directed by your caregiver. You may need periodic exams to check for polyps. SEEK MEDICAL CARE IF: You notice bleeding during a bowel movement. Document Released: 04/06/2004 Document Revised: 10/03/2011 Document Reviewed: 09/20/2011 [facility] Patient Information 2014 [place].

## 2013-12-25 NOTE — Telephone Encounter (Signed)
[phone]  Pt is needing a refill on HYDROcodone-acetaminophen (NORCO) 7.5-325 MG per tablet

## 2013-12-25 NOTE — Op Note (Addendum)
COLONOSCOPY PROCEDURE REPORT  PATIENT:  [name]  MR#:  [id number] Birthdate:  09/25/1950, 63 y.o., female Endoscopist:  Dr. Rogene Houston, MD Referred By:  Dr. Vic Blackbird, MD Procedure Date: 12/25/2013  Procedure:   Colonoscopy  Indications: Patient is 63 year old African female who is undergoing average risk screening colonoscopy.  Informed Consent:  The procedure and risks were reviewed with the patient and informed consent was obtained.  Medications:  Demerol 37.5 mg IV Versed 3 mg IV  Description of procedure:  After a digital rectal exam was performed, that colonoscope was advanced from the anus through the rectum and colon to the area of the cecum, ileocecal valve and appendiceal orifice. The cecum was deeply intubated. These structures were well-seen and photographed for the record. From the level of the cecum and ileocecal valve, the scope was slowly and cautiously withdrawn. The mucosal surfaces were carefully surveyed utilizing scope tip to flexion to facilitate fold flattening as needed. The scope was pulled down into the rectum where a thorough exam including retroflexion was performed.  Findings:   Prep fair to satisfactory. Two small polyps were cold snared and submitted together. One was located at splenic flexure and the second one and descending colon. Edge of p[olyp removed  at descending colon removed using cold biopsy forceps. 6 mm polyp cold snared rectosigmoid junction. Normal rectal mucosa and anal rectal junction.   Therapeutic/Diagnostic Maneuvers Performed:  See above  Complications:  None  Cecal Withdrawal Time:  19 minutes  Impression:  Examination performed to cecum. Two small polyps removed using cold snare and submitted together(splenic flexure and descending colon). 6 mm polyp cold snared from rectosigmoid junction.  Recommendations:  Standard instructions given. I will contact patient with biopsy results and further  recommendations.  Rogene Houston  12/25/2013 8:26 AM  CC: Dr. Vic Blackbird, MD & Dr. Rayne Du ref. provider found

## 2013-12-25 NOTE — Telephone Encounter (Signed)
Okay to refill? 

## 2013-12-26 ENCOUNTER — Encounter (HOSPITAL_COMMUNITY): Payer: Self-pay | Admitting: Internal Medicine

## 2014-01-14 ENCOUNTER — Telehealth: Payer: Self-pay | Admitting: *Deleted

## 2014-01-14 NOTE — Telephone Encounter (Signed)
Tell her to stop the lisinopril for 1 week, call us if the cough improves, then we will try another blood pressure medication Also could be allergies

## 2014-01-14 NOTE — Telephone Encounter (Signed)
Call placed to patient to inquire about cough.   Reports that she has had non-productive cough x1 month. States that she is abel to cough up thick sputum sometimes, but it is always clear. Denies any other S/Sx of infection or acute distress.   She believes one of her BP medications is causing the cough and would like to change the medication.   MD please advise.

## 2014-01-14 NOTE — Telephone Encounter (Signed)
Message copied by Sheral Flow on Tue Jan 14, 2014 10:22 AM ------      Message from: Devoria Glassing      Created: Tue Jan 14, 2014  9:48 AM       [phone]      PATIENT IS CALLING TO GET HER BLOOD PRESSURE MED CHANGED, SHE SAID IT IS MAKING HER COUGH A LOT  ------

## 2014-01-14 NOTE — Telephone Encounter (Signed)
Call placed to patient and patient made aware.  

## 2014-01-20 ENCOUNTER — Telehealth: Payer: Self-pay | Admitting: *Deleted

## 2014-01-20 ENCOUNTER — Encounter: Payer: Self-pay | Admitting: Family Medicine

## 2014-01-20 NOTE — Telephone Encounter (Signed)
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 Pt will get PNA shot at next visit.

## 2014-01-22 ENCOUNTER — Telehealth: Payer: Self-pay | Admitting: *Deleted

## 2014-01-22 MED ORDER — FUROSEMIDE 40 MG PO TABS: 40.0000 mg | ORAL_TABLET | Freq: Every day | ORAL | Status: DC

## 2014-01-22 MED ORDER — METHOCARBAMOL 500 MG PO TABS: ORAL_TABLET | ORAL | Status: DC

## 2014-01-22 NOTE — Telephone Encounter (Signed)
Call placed to patient.   States that her cough has resolved since she has been off of the Lisinopril.   Requested MD to prescribe new type of HTN medication.   States that she has no way to monitor BP, and she is not interested in obtaining a BP cuff.   MD please advise.

## 2014-01-22 NOTE — Telephone Encounter (Signed)
Message copied by Sheral Flow on Wed Jan 22, 2014 11:43 AM ------      Message from: [name]      Created: Wed Jan 22, 2014 11:23 AM      Regarding: Medication       Contact: [phone]       Pt is needing to speak to you about her medications that she is on and what she was taken off of  ------

## 2014-01-22 NOTE — Telephone Encounter (Signed)
Call returned to patient.   No answer, no VM.

## 2014-01-23 MED ORDER — LOSARTAN POTASSIUM 50 MG PO TABS: 50.0000 mg | ORAL_TABLET | Freq: Every day | ORAL | Status: DC

## 2014-01-23 NOTE — Telephone Encounter (Signed)
Call placed to patient and patient made aware.   Prescription sent to pharmacy.  

## 2014-01-23 NOTE — Telephone Encounter (Signed)
Recommend losartan 50 mg poqday recheck bp here in 1 month with labs.

## 2014-01-29 ENCOUNTER — Telehealth: Payer: Self-pay | Admitting: Family Medicine

## 2014-01-29 DIAGNOSIS — J439 Emphysema, unspecified: Secondary | ICD-10-CM

## 2014-01-29 MED ORDER — HYDROCODONE-ACETAMINOPHEN 7.5-325 MG PO TABS: 1.0000 | ORAL_TABLET | Freq: Four times a day (QID) | ORAL | Status: DC | PRN

## 2014-01-29 NOTE — Telephone Encounter (Signed)
Okay to do another referral- COPD,OSA Okay to refill meds

## 2014-01-29 NOTE — Telephone Encounter (Signed)
Call returned to patient.   States that she needs new referral to Pulmonologist d/t [employer]. Has been seeing Dr. Joya Gaskins across from [facility].   Also requested refill on Hydrocodone.   Ok to refill??  Last office visit 11/19/2013.  Last refill 12/25/2013.

## 2014-01-29 NOTE — Telephone Encounter (Signed)
Call placed to patient and patient made aware.  Prescription printed and patient made aware to come to office to pick up.

## 2014-01-29 NOTE — Telephone Encounter (Signed)
Patient needs refill on her pain medication and has some questions about if she needs a referral too please call her back at [phone]

## 2014-02-06 ENCOUNTER — Emergency Department (HOSPITAL_COMMUNITY): Payer: Medicare HMO

## 2014-02-06 ENCOUNTER — Encounter (HOSPITAL_COMMUNITY): Payer: Self-pay | Admitting: Emergency Medicine

## 2014-02-06 ENCOUNTER — Emergency Department (HOSPITAL_COMMUNITY)
Admission: EM | Admit: 2014-02-06 | Discharge: 2014-02-06 | Disposition: A | Discharge status: Home / Self Care | Payer: Medicare HMO | Attending: Emergency Medicine | Admitting: Emergency Medicine

## 2014-02-06 DIAGNOSIS — I10 Essential (primary) hypertension: Secondary | ICD-10-CM | POA: Insufficient documentation

## 2014-02-06 DIAGNOSIS — J4489 Other specified chronic obstructive pulmonary disease: Secondary | ICD-10-CM | POA: Insufficient documentation

## 2014-02-06 DIAGNOSIS — Z87891 Personal history of nicotine dependence: Secondary | ICD-10-CM | POA: Insufficient documentation

## 2014-02-06 DIAGNOSIS — Z79899 Other long term (current) drug therapy: Secondary | ICD-10-CM | POA: Insufficient documentation

## 2014-02-06 DIAGNOSIS — Z8659 Personal history of other mental and behavioral disorders: Secondary | ICD-10-CM | POA: Insufficient documentation

## 2014-02-06 DIAGNOSIS — IMO0002 Reserved for concepts with insufficient information to code with codable children: Secondary | ICD-10-CM | POA: Insufficient documentation

## 2014-02-06 DIAGNOSIS — M25579 Pain in unspecified ankle and joints of unspecified foot: Secondary | ICD-10-CM | POA: Insufficient documentation

## 2014-02-06 DIAGNOSIS — E662 Morbid (severe) obesity with alveolar hypoventilation: Secondary | ICD-10-CM | POA: Insufficient documentation

## 2014-02-06 DIAGNOSIS — M25572 Pain in left ankle and joints of left foot: Secondary | ICD-10-CM

## 2014-02-06 DIAGNOSIS — Z8719 Personal history of other diseases of the digestive system: Secondary | ICD-10-CM | POA: Insufficient documentation

## 2014-02-06 DIAGNOSIS — J449 Chronic obstructive pulmonary disease, unspecified: Secondary | ICD-10-CM | POA: Insufficient documentation

## 2014-02-06 DIAGNOSIS — M109 Gout, unspecified: Secondary | ICD-10-CM | POA: Insufficient documentation

## 2014-02-06 MED ORDER — KETOROLAC TROMETHAMINE 60 MG/2ML IM SOLN
60.0000 mg | Freq: Once | INTRAMUSCULAR | Status: AC
  Administered 2014-02-06: 60 mg via INTRAMUSCULAR
  Filled 2014-02-06: qty 2

## 2014-02-06 MED ORDER — NAPROXEN 500 MG PO TABS: 500.0000 mg | ORAL_TABLET | Freq: Two times a day (BID) | ORAL | Status: DC

## 2014-02-06 MED ORDER — PREDNISONE 10 MG PO TABS: ORAL_TABLET | ORAL | Status: DC

## 2014-02-06 NOTE — ED Notes (Signed)
Patient with no complaints at this time. Respirations even and unlabored. Skin warm/dry. Discharge instructions reviewed with patient at this time. Patient given opportunity to voice concerns/ask questions. Patient discharged at this time and left Emergency Department via wheelchair. 

## 2014-02-06 NOTE — ED Provider Notes (Signed)
CSN: [id number]     Arrival date & time 02/06/14  0849 History   First MD Initiated Contact with Patient 02/06/14 [phone]     No chief complaint on file.    (Consider location/radiation/quality/duration/timing/severity/associated sxs/prior Treatment) Patient is a 63 y.o. female presenting with lower extremity pain. The history is provided by the patient.  Foot Pain This is a recurrent problem. The current episode started in the past 7 days. The problem occurs constantly. The problem has been gradually worsening. Associated symptoms include arthralgias. Pertinent negatives include no chills, fever, numbness, rash, vomiting or weakness. The symptoms are aggravated by standing and walking. She has tried nothing for the symptoms. The treatment provided no relief.   Patient with hx of bilateral, recurrent foot pain secondary to heel spurs.  Patient reports pain to dorsal left foot for several days.  She states that she normally has pain to the heel area, but pain to the dorsal foot is new.  She also noticed swelling and bruising to the top of her foot.  She denies known injury, numbness or weakness to her foot.  She also denies any pain or swelling proximal to the ankle.  Pt is not a diabetic and has seen [facility] [place] previously.     Past Medical History  Diagnosis Date  . Obesity hypoventilation syndrome   . Obstructive sleep apnea   . Osteoarthritis   . Low back pain   . HTN (hypertension)   . Hyperlipidemia   . GERD (gastroesophageal reflux disease)   . Depression   . COPD (chronic obstructive pulmonary disease)   . Anxiety   . Gout   . Achilles tendinitis    Past Surgical History  Procedure Laterality Date  . Tubal ligation    . Cardiac catheterization  2006  . Colonoscopy N/A 12/25/2013    Procedure: COLONOSCOPY;  Surgeon: Rogene Houston, MD;  Location: [facility];  Service: Endoscopy;  Laterality: N/A;  730-rescheduled Ann notified pt   Family History  Problem Relation Age  of Onset  . Heart disease Mother   . Thyroid disease Mother   . Heart failure Father   . Diabetes Brother   . Crohn's disease Daughter    History  Substance Use Topics  . Smoking status: Former Smoker -- 1.50 packs/day for 30 years    Types: Cigarettes    Quit date: 03/25/2005  . Smokeless tobacco: Never Used  . Alcohol Use: No   OB History   Grav Para Term Preterm Abortions TAB SAB Ect Mult Living   3 3 3       3      Review of Systems  Constitutional: Negative for fever and chills.  Gastrointestinal: Negative for vomiting.  Genitourinary: Negative for dysuria and difficulty urinating.  Musculoskeletal: Positive for arthralgias. Negative for back pain.       Swelling and pain to the top of the left foot and ankle.  Skin: Negative for color change, rash and wound.  Neurological: Negative for dizziness, weakness and numbness.  All other systems reviewed and are negative.     Allergies  Review of patient's allergies indicates no known allergies.  Home Medications   Prior to Admission medications   Medication Sig Start Date End Date Taking? Authorizing Provider  aspirin 325 MG tablet Take 325 mg by mouth daily.     Historical Provider, MD  clotrimazole-betamethasone (LOTRISONE) cream Apply 1 application topically 2 (two) times daily as needed. To be applied to the feet  as needed    Historical Provider, MD  colchicine 0.6 MG tablet TAKE 1 TABLET BY MOUTH EVERY DAY 09/03/13   Alycia Rossetti, MD  fluticasone (CUTIVATE) 0.05 % cream Apply 1 application topically daily. 11/26/13   Alycia Rossetti, MD  furosemide (LASIX) 40 MG tablet Take 1 tablet (40 mg total) by mouth daily. 01/22/14   Alycia Rossetti, MD  HYDROcodone-acetaminophen (NORCO) 7.5-325 MG per tablet Take 1 tablet by mouth every 6 (six) hours as needed for moderate pain. 01/29/14   Alycia Rossetti, MD  losartan (COZAAR) 50 MG tablet Take 1 tablet (50 mg total) by mouth daily. 01/23/14   Susy Frizzle, MD   methocarbamol (ROBAXIN) 500 MG tablet TAKE 1 TABLET BY MOUTH TWICE DAILY AS NEEDED FOR MUSCLE SPASMS 01/22/14   Alycia Rossetti, MD  NON FORMULARY at bedtime. CPAP and 1 lpm oxygen qhs    Historical Provider, MD  simvastatin (ZOCOR) 20 MG tablet Take 1 tablet (20 mg total) by mouth at bedtime. 10/09/13   Alycia Rossetti, MD  tiotropium (SPIRIVA) 18 MCG inhalation capsule Place 1 capsule (18 mcg total) into inhaler and inhale daily. 08/12/13   Elsie Stain, MD   There were no vitals taken for this visit. Physical Exam  Nursing note and vitals reviewed. Constitutional: She is oriented to person, place, and time. She appears well-developed and well-nourished. No distress.  HENT:  Head: Normocephalic and atraumatic.  Cardiovascular: Normal rate, regular rhythm, normal heart sounds and intact distal pulses.   No murmur heard. Pulmonary/Chest: Effort normal and breath sounds normal. No respiratory distress.  Musculoskeletal: She exhibits edema and tenderness.  Localized ttp of the left dorsal foot and distal ankle.  Small area of bruising also present.  ROM is preserved.  DP pulse is brisk,distal sensation intact.  No erythema, excessive warmth,  abrasion, or bony deformity.  No proximal tenderness.  Compartments of the left LE are soft  Neurological: She is alert and oriented to person, place, and time. She exhibits normal muscle tone. Coordination normal.  Skin: Skin is warm and dry.    ED Course  Procedures (including critical care time) Labs Review Labs Reviewed - No data to display  Imaging Review Dg Ankle Complete Left  02/06/2014   CLINICAL DATA:  Pain and swelling.  EXAM: LEFT ANKLE COMPLETE - 3+ VIEW  COMPARISON:  Left foot 02/06/2014  FINDINGS: Areas of lucency throughout the soft tissues could represent edema. The left ankle is located without an acute fracture. There is spurring along the plantar aspect of the calcaneus and along the insertion of the Achilles tendon. Degenerative  changes along the dorsal aspect of the midfoot. Patient may have a flatfoot deformity.  IMPRESSION: No acute bone abnormality within the left ankle.  Degenerative changes in the foot and spurring at the calcaneus.   Electronically Signed   By: Markus Daft M.D.   On: 02/06/2014 09:39   Dg Foot Complete Left  02/06/2014   CLINICAL DATA:  Left foot pain and swelling  EXAM: LEFT FOOT - COMPLETE 3+ VIEW  COMPARISON:  None.  FINDINGS: No acute fracture or dislocation is noted. A well corticated bony density is noted adjacent to the first cuneiform consistent with prior avulsion fracture. Calcaneal spurs are noted. Degenerative changes in the tarsal bones are seen. Generalized soft tissue swelling is seen  IMPRESSION: Soft tissue changes without acute bony abnormality.   Electronically Signed   By: Linus Mako.D.  On: 02/06/2014 09:40     EKG Interpretation None      MDM   Final diagnoses:  Arthralgia of foot, left    In review of the patient's medical charts, pt had hydrocodone 7.5 mg tablets, #45 refilled on 01/29/14 by PMD.  She had XR of left foot in April by [facility] that showed significant arthropathy retrocalcaneal spurring osteophytes as well as dorsal spurring first metatarsal and cuneiforms.    Pt is feeling better after IM Toradol  Today's XR results reviewed and discussed with pt.  She requests a post op shoe for comfort.  No concerning sx's for fx or infectious process.  I have advised pt to use her cane for walking, elevate her foot when possible and arrange f/u with her podiatrist for recheck. Pt agrees to plan and appears stable for d/c      Loyce Klasen L. Ercell Razon, PA-C 02/06/14 1019

## 2014-02-06 NOTE — ED Notes (Signed)
Pt c/o pain in top of left foot for the past few days.  Denies injury.  Foot and ankle swollen bilaterally.  Pt reports has chronic pain in r foot from bone spurs.

## 2014-02-06 NOTE — Discharge Instructions (Signed)

## 2014-02-07 NOTE — ED Provider Notes (Signed)
Medical screening examination/treatment/procedure(s) were performed by non-physician practitioner and as supervising physician I was immediately available for consultation/collaboration.   EKG Interpretation None       Babette Relic, MD 02/07/14 1757

## 2014-02-14 ENCOUNTER — Emergency Department (HOSPITAL_COMMUNITY)
Admission: EM | Admit: 2014-02-14 | Discharge: 2014-02-14 | Disposition: A | Discharge status: Home / Self Care | Payer: Medicare HMO | Attending: Emergency Medicine | Admitting: Emergency Medicine

## 2014-02-14 ENCOUNTER — Encounter (HOSPITAL_COMMUNITY): Payer: Self-pay | Admitting: Emergency Medicine

## 2014-02-14 DIAGNOSIS — Z87891 Personal history of nicotine dependence: Secondary | ICD-10-CM | POA: Insufficient documentation

## 2014-02-14 DIAGNOSIS — J449 Chronic obstructive pulmonary disease, unspecified: Secondary | ICD-10-CM | POA: Diagnosis not present

## 2014-02-14 DIAGNOSIS — Z79899 Other long term (current) drug therapy: Secondary | ICD-10-CM | POA: Insufficient documentation

## 2014-02-14 DIAGNOSIS — Z8719 Personal history of other diseases of the digestive system: Secondary | ICD-10-CM | POA: Diagnosis not present

## 2014-02-14 DIAGNOSIS — Z7982 Long term (current) use of aspirin: Secondary | ICD-10-CM | POA: Diagnosis not present

## 2014-02-14 DIAGNOSIS — J4489 Other specified chronic obstructive pulmonary disease: Secondary | ICD-10-CM | POA: Insufficient documentation

## 2014-02-14 DIAGNOSIS — Z791 Long term (current) use of non-steroidal anti-inflammatories (NSAID): Secondary | ICD-10-CM | POA: Insufficient documentation

## 2014-02-14 DIAGNOSIS — M109 Gout, unspecified: Secondary | ICD-10-CM | POA: Insufficient documentation

## 2014-02-14 DIAGNOSIS — M25579 Pain in unspecified ankle and joints of unspecified foot: Secondary | ICD-10-CM | POA: Insufficient documentation

## 2014-02-14 DIAGNOSIS — IMO0002 Reserved for concepts with insufficient information to code with codable children: Secondary | ICD-10-CM | POA: Diagnosis not present

## 2014-02-14 DIAGNOSIS — Z8659 Personal history of other mental and behavioral disorders: Secondary | ICD-10-CM | POA: Diagnosis not present

## 2014-02-14 DIAGNOSIS — I10 Essential (primary) hypertension: Secondary | ICD-10-CM | POA: Diagnosis not present

## 2014-02-14 MED ORDER — SULFAMETHOXAZOLE-TMP DS 800-160 MG PO TABS
1.0000 | ORAL_TABLET | Freq: Once | ORAL | Status: AC
  Administered 2014-02-14: 1 via ORAL
  Filled 2014-02-14: qty 1

## 2014-02-14 MED ORDER — HYDROMORPHONE HCL PF 1 MG/ML IJ SOLN
1.0000 mg | Freq: Once | INTRAMUSCULAR | Status: AC
  Administered 2014-02-14: 1 mg via INTRAMUSCULAR
  Filled 2014-02-14: qty 1

## 2014-02-14 MED ORDER — SULFAMETHOXAZOLE-TMP DS 800-160 MG PO TABS: 1.0000 | ORAL_TABLET | Freq: Two times a day (BID) | ORAL | Status: DC

## 2014-02-14 MED ORDER — KETOROLAC TROMETHAMINE 60 MG/2ML IM SOLN
60.0000 mg | Freq: Once | INTRAMUSCULAR | Status: AC
  Administered 2014-02-14: 60 mg via INTRAMUSCULAR
  Filled 2014-02-14: qty 2

## 2014-02-14 MED ORDER — NAPROXEN 500 MG PO TABS: 500.0000 mg | ORAL_TABLET | Freq: Two times a day (BID) | ORAL | Status: DC

## 2014-02-14 MED ORDER — HYDROCODONE-ACETAMINOPHEN 7.5-325 MG PO TABS: 1.0000 | ORAL_TABLET | Freq: Four times a day (QID) | ORAL | Status: DC | PRN

## 2014-02-14 NOTE — ED Provider Notes (Signed)
CSN: [id number]     Arrival date & time 02/14/14  1411 History   First MD Initiated Contact with Patient 02/14/14 1431     Chief Complaint  Patient presents with  . Foot Pain     (Consider location/radiation/quality/duration/timing/severity/associated sxs/prior Treatment) HPI Comments: [name] is a 63 y.o. Female presenting with left foot pain which flared up again since her last visit here on 7/16.  She has a history of gout and also has a heel spur and bunion on this foot causing chronic pain for which she is followed at the [facility].  Her pain is worst at her left mtp joint and is worsened with weight bearing and palpation.  She has redness and swelling across her foot dorsum which is mildly tender.  She denies any new injury.  She has been using warm epsom salt soaks without relief but has dried her skin.  She denies fevers or chills. She takes colchicine daily and recently ran out of her hydrocodone.     The history is provided by the patient.    Past Medical History  Diagnosis Date  . Obesity hypoventilation syndrome   . Obstructive sleep apnea   . Osteoarthritis   . Low back pain   . HTN (hypertension)   . Hyperlipidemia   . GERD (gastroesophageal reflux disease)   . Depression   . COPD (chronic obstructive pulmonary disease)   . Anxiety   . Gout   . Achilles tendinitis    Past Surgical History  Procedure Laterality Date  . Tubal ligation    . Cardiac catheterization  2006  . Colonoscopy N/A 12/25/2013    Procedure: COLONOSCOPY;  Surgeon: Rogene Houston, MD;  Location: [facility];  Service: Endoscopy;  Laterality: N/A;  730-rescheduled Ann notified pt   Family History  Problem Relation Age of Onset  . Heart disease Mother   . Thyroid disease Mother   . Heart failure Father   . Diabetes Brother   . Crohn's disease Daughter    History  Substance Use Topics  . Smoking status: Former Smoker -- 1.50 packs/day for 30 years    Types:  Cigarettes    Quit date: 03/25/2005  . Smokeless tobacco: Never Used  . Alcohol Use: No   OB History   Grav Para Term Preterm Abortions TAB SAB Ect Mult Living   3 3 3       3      Review of Systems  Constitutional: Negative for fever.  Musculoskeletal: Positive for arthralgias and joint swelling. Negative for myalgias.  Skin: Positive for color change.  Neurological: Negative for weakness and numbness.      Allergies  Review of patient's allergies indicates no known allergies.  Home Medications   Prior to Admission medications   Medication Sig Start Date End Date Taking? Authorizing Provider  aspirin EC 325 MG tablet Take 325 mg by mouth daily.   Yes Historical Provider, MD  colchicine 0.6 MG tablet Take 0.6 mg by mouth daily.   Yes Historical Provider, MD  furosemide (LASIX) 40 MG tablet Take 1 tablet (40 mg total) by mouth daily. 01/22/14  Yes Alycia Rossetti, MD  HYDROcodone-acetaminophen (NORCO) 7.5-325 MG per tablet Take 1 tablet by mouth every 6 (six) hours as needed for moderate pain. 01/29/14  Yes Alycia Rossetti, MD  losartan (COZAAR) 50 MG tablet Take 1 tablet (50 mg total) by mouth daily. 01/23/14  Yes Susy Frizzle, MD  methocarbamol (ROBAXIN)  500 MG tablet Take 500 mg by mouth 2 (two) times daily as needed for muscle spasms.   Yes Historical Provider, MD  naproxen (NAPROSYN) 500 MG tablet Take 1 tablet (500 mg total) by mouth 2 (two) times daily. 02/06/14  Yes Tammy L. Triplett, PA-C  clotrimazole-betamethasone (LOTRISONE) cream Apply 1 application topically 2 (two) times daily as needed. To be applied to the feet as needed    Historical Provider, MD  fluticasone (CUTIVATE) 0.05 % cream Apply 1 application topically daily. 11/26/13   Alycia Rossetti, MD  HYDROcodone-acetaminophen (NORCO) 7.5-325 MG per tablet Take 1 tablet by mouth every 6 (six) hours as needed for moderate pain. 02/14/14   Evalee Jefferson, PA-C  naproxen (NAPROSYN) 500 MG tablet Take 1 tablet (500 mg total)  by mouth 2 (two) times daily. 02/14/14   Evalee Jefferson, PA-C  NON FORMULARY at bedtime. CPAP and 1 lpm oxygen qhs    Historical Provider, MD  sulfamethoxazole-trimethoprim (BACTRIM DS) 800-160 MG per tablet Take 1 tablet by mouth 2 (two) times daily. 02/14/14   Evalee Jefferson, PA-C   BP 123/62  Pulse 93  Temp(Src) 98.1 F (36.7 C) (Oral)  Resp 18  SpO2 95% Physical Exam  Constitutional: She appears well-developed and well-nourished.  HENT:  Head: Atraumatic.  Neck: Normal range of motion.  Cardiovascular:  Pulses equal bilaterally  Musculoskeletal: She exhibits tenderness.       Left foot: She exhibits tenderness and swelling. She exhibits normal capillary refill and no deformity.  ttp with edema medial dorsal left foot and along left great toe at mtp joint.  Dorsal foot is mildly tender and erythematous.  She has cracked fissure between great and 2nd toes.  Dark thickened great toenail c/w onychomycosis,  Toe and distal foot scaly skin which she states is from dry skin from frequent epsom salt soaks.  Dorsal pedis pulse intact.  Distal sensation normal.   Neurological: She is alert. She has normal strength. She displays normal reflexes. No sensory deficit.  Skin: Skin is warm and dry.  Psychiatric: She has a normal mood and affect.    ED Course  Procedures (including critical care time) Labs Review Labs Reviewed - No data to display  Imaging Review No results found.   EKG Interpretation None      MDM   Final diagnoses:  Acute gout of left foot, unspecified cause    Patient with chronic foot pain secondary to heel spurs, but also with history of gout.  Suspect gouty flare, although patient has risk factors for cellulitis given scaly, cracked skin and hyperkeratotic nail changes consistent with onychomycosis.  She will be prescribed naproxen, encouraged to continue taking colchicine,  Will add bactrim for possible early cellulitis.  Pt is not diabetic.  She was advised elevation,   Warm compresses.  Prescribed hydrocodone 7.5 mg on 01/29/14, has run out of this medicine.  Prescribed a few for her, but advised she needs to see her pcp for further pain management, and for consideration of tx of her fungal toenail infection.  She expresses she has appt in 2 weeks with her.  Advised closer f/u if she develops any worsened sx.     Evalee Jefferson, PA-C 02/14/14 [phone]

## 2014-02-14 NOTE — Discharge Instructions (Signed)
Gout Gout is an inflammatory arthritis caused by a buildup of uric acid crystals in the joints. Uric acid is a chemical that is normally present in the blood. When the level of uric acid in the blood is too high it can form crystals that deposit in your joints and tissues. This causes joint redness, soreness, and swelling (inflammation). Repeat attacks are common. Over time, uric acid crystals can form into masses (tophi) near a joint, destroying bone and causing disfigurement. Gout is treatable and often preventable. CAUSES  The disease begins with elevated levels of uric acid in the blood. Uric acid is produced by your body when it breaks down a naturally found substance called purines. Certain foods you eat, such as meats and fish, contain high amounts of purines. Causes of an elevated uric acid level include:  Being passed down from parent to child (heredity).  Diseases that cause increased uric acid production (such as obesity, psoriasis, and certain cancers).  Excessive alcohol use.  Diet, especially diets rich in meat and seafood.  Medicines, including certain cancer-fighting medicines (chemotherapy), water pills (diuretics), and aspirin.  Chronic kidney disease. The kidneys are no longer able to remove uric acid well.  Problems with metabolism. Conditions strongly associated with gout include:  Obesity.  High blood pressure.  High cholesterol.  Diabetes. Not everyone with elevated uric acid levels gets gout. It is not understood why some people get gout and others do not. Surgery, joint injury, and eating too much of certain foods are some of the factors that can lead to gout attacks. SYMPTOMS   An attack of gout comes on quickly. It causes intense pain with redness, swelling, and warmth in a joint.  Fever can occur.  Often, only one joint is involved. Certain joints are more commonly involved:  Base of the big toe.  Knee.  Ankle.  Wrist.  Finger. Without  treatment, an attack usually goes away in a few days to weeks. Between attacks, you usually will not have symptoms, which is different from many other forms of arthritis. DIAGNOSIS  Your caregiver will suspect gout based on your symptoms and exam. In some cases, tests may be recommended. The tests may include:  Blood tests.  Urine tests.  X-rays.  Joint fluid exam. This exam requires a needle to remove fluid from the joint (arthrocentesis). Using a microscope, gout is confirmed when uric acid crystals are seen in the joint fluid. TREATMENT  There are two phases to gout treatment: treating the sudden onset (acute) attack and preventing attacks (prophylaxis).  Treatment of an Acute Attack.  Medicines are used. These include anti-inflammatory medicines or steroid medicines.  An injection of steroid medicine into the affected joint is sometimes necessary.  The painful joint is rested. Movement can worsen the arthritis.  You may use warm or cold treatments on painful joints, depending which works best for you.  Treatment to Prevent Attacks.  If you suffer from frequent gout attacks, your caregiver may advise preventive medicine. These medicines are started after the acute attack subsides. These medicines either help your kidneys eliminate uric acid from your body or decrease your uric acid production. You may need to stay on these medicines for a very long time.  The early phase of treatment with preventive medicine can be associated with an increase in acute gout attacks. For this reason, during the first few months of treatment, your caregiver may also advise you to take medicines usually used for acute gout treatment. Be sure you  understand your caregiver's directions. Your caregiver may make several adjustments to your medicine dose before these medicines are effective.  Discuss dietary treatment with your caregiver or dietitian. Alcohol and drinks high in sugar and fructose and foods  such as meat, poultry, and seafood can increase uric acid levels. Your caregiver or dietitian can advise you on drinks and foods that should be limited. HOME CARE INSTRUCTIONS   Do not take aspirin to relieve pain. This raises uric acid levels.  Only take over-the-counter or prescription medicines for pain, discomfort, or fever as directed by your caregiver.  Rest the joint as much as possible. When in bed, keep sheets and blankets off painful areas.  Keep the affected joint raised (elevated).  Apply warm or cold treatments to painful joints. Use of warm or cold treatments depends on which works best for you.  Use crutches if the painful joint is in your leg.  Drink enough fluids to keep your urine clear or pale yellow. This helps your body get rid of uric acid. Limit alcohol, sugary drinks, and fructose drinks.  Follow your dietary instructions. Pay careful attention to the amount of protein you eat. Your daily diet should emphasize fruits, vegetables, whole grains, and fat-free or low-fat milk products. Discuss the use of coffee, vitamin C, and cherries with your caregiver or dietitian. These may be helpful in lowering uric acid levels.  Maintain a healthy body weight. SEEK MEDICAL CARE IF:   You develop diarrhea, vomiting, or any side effects from medicines.  You do not feel better in 24 hours, or you are getting worse. SEEK IMMEDIATE MEDICAL CARE IF:   Your joint becomes suddenly more tender, and you have chills or a fever. MAKE SURE YOU:   Understand these instructions.  Will watch your condition.  Will get help right away if you are not doing well or get worse. Document Released: 07/08/2000 Document Revised: 11/25/2013 Document Reviewed: 02/22/2012 [facility] Patient Information 2015 [place], [place]. This information is not intended to replace advice given to you by your health care provider. Make sure you discuss any questions you have with your health care provider.   I  suspect you have a flare up of your gout, but cannot rule a possible skin infection, so you are being given an antibiotic as well.  Elevate and apply warm compresses as much as possible.  You may take the hydrocodone prescribed for pain relief.  This will make you drowsy - do not drive within 4 hours of taking this medication.  Take your entire course of the antibiotics.  Discuss with your doctor being treated for your toenail fungal infection.

## 2014-02-15 NOTE — ED Provider Notes (Signed)
Medical screening examination/treatment/procedure(s) were performed by non-physician practitioner and as supervising physician I was immediately available for consultation/collaboration.   Dorie Rank, MD 02/15/14 [phone]

## 2014-02-26 ENCOUNTER — Ambulatory Visit (INDEPENDENT_AMBULATORY_CARE_PROVIDER_SITE_OTHER): Payer: Commercial Managed Care - HMO | Admitting: Family Medicine

## 2014-02-26 ENCOUNTER — Encounter: Payer: Self-pay | Admitting: Family Medicine

## 2014-02-26 VITALS — BP 144/86 | HR 72 | Temp 98.0°F | Resp 14 | Ht 65.0 in | Wt 278.0 lb

## 2014-02-26 DIAGNOSIS — R609 Edema, unspecified: Secondary | ICD-10-CM

## 2014-02-26 DIAGNOSIS — M1990 Unspecified osteoarthritis, unspecified site: Secondary | ICD-10-CM

## 2014-02-26 DIAGNOSIS — M7732 Calcaneal spur, left foot: Secondary | ICD-10-CM

## 2014-02-26 DIAGNOSIS — I10 Essential (primary) hypertension: Secondary | ICD-10-CM

## 2014-02-26 DIAGNOSIS — M10072 Idiopathic gout, left ankle and foot: Secondary | ICD-10-CM

## 2014-02-26 DIAGNOSIS — J438 Other emphysema: Secondary | ICD-10-CM

## 2014-02-26 DIAGNOSIS — E785 Hyperlipidemia, unspecified: Secondary | ICD-10-CM

## 2014-02-26 DIAGNOSIS — M109 Gout, unspecified: Secondary | ICD-10-CM

## 2014-02-26 DIAGNOSIS — G4733 Obstructive sleep apnea (adult) (pediatric): Secondary | ICD-10-CM

## 2014-02-26 DIAGNOSIS — R7302 Impaired glucose tolerance (oral): Secondary | ICD-10-CM

## 2014-02-26 DIAGNOSIS — R7309 Other abnormal glucose: Secondary | ICD-10-CM

## 2014-02-26 DIAGNOSIS — M7730 Calcaneal spur, unspecified foot: Secondary | ICD-10-CM

## 2014-02-26 DIAGNOSIS — J439 Emphysema, unspecified: Secondary | ICD-10-CM

## 2014-02-26 LAB — BASIC METABOLIC PANEL
BUN: 17 mg/dL (ref 6–23)
CHLORIDE: 102 meq/L (ref 96–112)
CO2: 29 mEq/L (ref 19–32)
Calcium: 9.2 mg/dL (ref 8.4–10.5)
Creat: 1.23 mg/dL — ABNORMAL HIGH (ref 0.50–1.10)
GLUCOSE: 84 mg/dL (ref 70–99)
Potassium: 4.8 mEq/L (ref 3.5–5.3)
SODIUM: 137 meq/L (ref 135–145)

## 2014-02-26 LAB — HEMOGLOBIN A1C
HEMOGLOBIN A1C: 5.9 % — AB (ref <5.7)
Mean Plasma Glucose: 123 mg/dL — ABNORMAL HIGH (ref <117)

## 2014-02-26 LAB — URIC ACID: Uric Acid, Serum: 7.9 mg/dL — ABNORMAL HIGH (ref 2.4–7.0)

## 2014-02-26 MED ORDER — NIACIN ER (ANTIHYPERLIPIDEMIC) 500 MG PO TBCR: 500.0000 mg | EXTENDED_RELEASE_TABLET | Freq: Every day | ORAL | Status: DC

## 2014-02-26 MED ORDER — ALLOPURINOL 100 MG PO TABS: 100.0000 mg | ORAL_TABLET | Freq: Every day | ORAL | Status: DC

## 2014-02-26 MED ORDER — CLOTRIMAZOLE-BETAMETHASONE 1-0.05 % EX CREA: 1.0000 "application " | TOPICAL_CREAM | Freq: Two times a day (BID) | CUTANEOUS | Status: DC | PRN

## 2014-02-26 MED ORDER — HYDROCODONE-ACETAMINOPHEN 7.5-325 MG PO TABS: 1.0000 | ORAL_TABLET | Freq: Four times a day (QID) | ORAL | Status: DC | PRN

## 2014-02-26 NOTE — Assessment & Plan Note (Signed)
Blood pressure elevated however no medications taken today for now we will continue the low started as well as the Lasix

## 2014-02-26 NOTE — Assessment & Plan Note (Signed)
Trial of Niaspan for cholesterol she declines statin drugs she is unable to afford Zetia

## 2014-02-26 NOTE — Assessment & Plan Note (Signed)
Compression hose for 20-30 mmhg

## 2014-02-26 NOTE — Patient Instructions (Signed)
Start allopurinol once a day for gout Pain medication refilled Use the niaspan at bedtime for cholesterol We will cal with labs Bone Density to be set up  Referral to Pulmonary and Foot doctor Cream refilled F/U 3 months

## 2014-02-26 NOTE — Progress Notes (Signed)
Patient ID: [name], female   DOB: 1951/01/15, 63 y.o.   MRN: [id number]   Subjective:    Patient ID: [name], female    DOB: 1950/12/20, 63 y.o.   MRN: [id number]  Patient presents for Foot pain  patient here to followup ER visit for foot pain she has known heel spurs and arthritis in her foot she needs a new referral to podiatry she also has known underlying gout. She was treated for possible cellulitis that she had a lot of ear edema and warmth across the top of her left foot as well this has resolved she still has some soreness especially when she walks. Her x-ray showed the arthritis and the bone spurs.  COPD her COPD has been stable but she requires a new referral to go back to her lung Dr.    Review Of Systems:  GEN- denies fatigue, fever, weight loss,weakness, recent illness HEENT- denies eye drainage, change in vision, nasal discharge, CVS- denies chest pain, palpitations RESP- denies SOB, cough, wheeze ABD- denies N/V, change in stools, abd pain GU- denies dysuria, hematuria, dribbling, incontinence MSK- + joint pain, muscle aches, injury Neuro- denies headache, dizziness, syncope, seizure activity       Objective:    BP 144/86  Pulse 72  Temp(Src) 98 F (36.7 C) (Oral)  Resp 14  Ht 5\' 5"  (1.651 m)  Wt 278 lb (126.1 kg)  BMI 46.26 kg/m2 GEN- NAD, alert and oriented x3 HEENT- PERRL, EOMI, non injected sclera, pink conjunctiva, MMM, oropharynx clear CVS- RRR, no murmur RESP-CTAB EXT- bilat pedal  Edema, TTP top of left foot, no erythema, TTP left heel, mild swelling of left great toe Pulses- Radial, DP- 2+        Assessment & Plan:      Problem List Items Addressed This Visit   OSTEOARTHRITIS     Pain medication refills Referral back to podiatry    Relevant Medications      allopurinol (ZYLOPRIM) tablet      HYDROcodone-acetaminophen (NORCO) 7.5-325 MG per tablet   HYPERTENSION      Blood pressure elevated however no medications  taken today for now we will continue the low started as well as the Lasix    Relevant Medications      niacin (NIASPAN) CR tablet   HYPERLIPIDEMIA   Relevant Medications      niacin (NIASPAN) CR tablet   Gout - Primary     She's had severe episodes of gout here recently. She agrees to start allopurinol once a day. We'll use a culture seen for breakthrough episodes    Relevant Orders      Uric Acid (Completed)      Basic metabolic panel (Completed)   Glucose intolerance (impaired glucose tolerance)   Relevant Orders      Hemoglobin A1c   Edema     Compression hose for 20-30 mmhg    COPD with emphysema gold stage B      Note: This dictation was prepared with Dragon dictation along with smaller phrase technology. Any transcriptional errors that result from this process are unintentional.

## 2014-02-26 NOTE — Assessment & Plan Note (Signed)
Pain medication refills Referral back to podiatry

## 2014-02-26 NOTE — Assessment & Plan Note (Signed)
She's had severe episodes of gout here recently. She agrees to start allopurinol once a day. We'll use a culture seen for breakthrough episodes

## 2014-03-03 ENCOUNTER — Other Ambulatory Visit: Payer: Self-pay | Admitting: Family Medicine

## 2014-03-03 NOTE — Telephone Encounter (Signed)
okay

## 2014-03-03 NOTE — Telephone Encounter (Signed)
Ok to refill??  Last office visit 11/29/2013.

## 2014-03-04 NOTE — Telephone Encounter (Signed)
Prescription sent to pharmacy.

## 2014-03-12 ENCOUNTER — Emergency Department (HOSPITAL_COMMUNITY)
Admission: EM | Admit: 2014-03-12 | Discharge: 2014-03-12 | Disposition: A | Discharge status: Home / Self Care | Payer: Medicare HMO | Attending: Emergency Medicine | Admitting: Emergency Medicine

## 2014-03-12 ENCOUNTER — Encounter (HOSPITAL_COMMUNITY): Payer: Self-pay | Admitting: Emergency Medicine

## 2014-03-12 DIAGNOSIS — Z79899 Other long term (current) drug therapy: Secondary | ICD-10-CM | POA: Insufficient documentation

## 2014-03-12 DIAGNOSIS — J449 Chronic obstructive pulmonary disease, unspecified: Secondary | ICD-10-CM | POA: Insufficient documentation

## 2014-03-12 DIAGNOSIS — IMO0002 Reserved for concepts with insufficient information to code with codable children: Secondary | ICD-10-CM | POA: Diagnosis not present

## 2014-03-12 DIAGNOSIS — Z8659 Personal history of other mental and behavioral disorders: Secondary | ICD-10-CM | POA: Diagnosis not present

## 2014-03-12 DIAGNOSIS — Z87891 Personal history of nicotine dependence: Secondary | ICD-10-CM | POA: Insufficient documentation

## 2014-03-12 DIAGNOSIS — Z862 Personal history of diseases of the blood and blood-forming organs and certain disorders involving the immune mechanism: Secondary | ICD-10-CM | POA: Diagnosis not present

## 2014-03-12 DIAGNOSIS — Z8719 Personal history of other diseases of the digestive system: Secondary | ICD-10-CM | POA: Diagnosis not present

## 2014-03-12 DIAGNOSIS — Z8639 Personal history of other endocrine, nutritional and metabolic disease: Secondary | ICD-10-CM | POA: Insufficient documentation

## 2014-03-12 DIAGNOSIS — Z7982 Long term (current) use of aspirin: Secondary | ICD-10-CM | POA: Diagnosis not present

## 2014-03-12 DIAGNOSIS — M79672 Pain in left foot: Secondary | ICD-10-CM

## 2014-03-12 DIAGNOSIS — M79609 Pain in unspecified limb: Secondary | ICD-10-CM | POA: Insufficient documentation

## 2014-03-12 DIAGNOSIS — M1990 Unspecified osteoarthritis, unspecified site: Secondary | ICD-10-CM | POA: Diagnosis not present

## 2014-03-12 DIAGNOSIS — M109 Gout, unspecified: Secondary | ICD-10-CM | POA: Insufficient documentation

## 2014-03-12 DIAGNOSIS — Z791 Long term (current) use of non-steroidal anti-inflammatories (NSAID): Secondary | ICD-10-CM | POA: Insufficient documentation

## 2014-03-12 DIAGNOSIS — J4489 Other specified chronic obstructive pulmonary disease: Secondary | ICD-10-CM | POA: Insufficient documentation

## 2014-03-12 DIAGNOSIS — I10 Essential (primary) hypertension: Secondary | ICD-10-CM | POA: Diagnosis not present

## 2014-03-12 MED ORDER — SULFAMETHOXAZOLE-TRIMETHOPRIM 800-160 MG PO TABS: 1.0000 | ORAL_TABLET | Freq: Two times a day (BID) | ORAL | Status: DC

## 2014-03-12 MED ORDER — NAPROXEN 500 MG PO TABS: 500.0000 mg | ORAL_TABLET | Freq: Two times a day (BID) | ORAL | Status: DC

## 2014-03-12 MED ORDER — HYDROMORPHONE HCL PF 1 MG/ML IJ SOLN
1.0000 mg | Freq: Once | INTRAMUSCULAR | Status: AC
  Administered 2014-03-12: 1 mg via INTRAMUSCULAR
  Filled 2014-03-12: qty 1

## 2014-03-12 MED ORDER — KETOROLAC TROMETHAMINE 60 MG/2ML IM SOLN
60.0000 mg | Freq: Once | INTRAMUSCULAR | Status: AC
  Administered 2014-03-12: 60 mg via INTRAMUSCULAR
  Filled 2014-03-12: qty 2

## 2014-03-12 NOTE — ED Notes (Signed)
PA at bedside. Pt reporting that she has an appt with "foot doctor" in September.

## 2014-03-12 NOTE — ED Provider Notes (Signed)
CSN: [id number]     Arrival date & time 03/12/14  1008 History   First MD Initiated Contact with Patient 03/12/14 1015     Chief Complaint  Patient presents with  . Foot Pain     (Consider location/radiation/quality/duration/timing/severity/associated sxs/prior Treatment) HPI  [name] is a 63 y.o. female who presents to the Emergency Department complaining of recurrent left foot pain that began yesterday.  C/o pain mainly to the dorsal foot and medial left great toe.  Pain is worse with weight bearing and improves at rest.  Patient has h/o frequent gout flares, but states this pain is different and she has been taking allopurinol since she saw her PMD on 02/26/14 and was told that her "gout level" was elevated.  She denies fever, chills, N/V, increased swelling of her foot, pain proximal to the foot, or recent injury.  She states that last time she had this, she had an infection to her foot.  She reports having an appt with her foot doctor in early September  Past Medical History  Diagnosis Date  . Obesity hypoventilation syndrome   . Obstructive sleep apnea   . Osteoarthritis   . Low back pain   . HTN (hypertension)   . Hyperlipidemia   . GERD (gastroesophageal reflux disease)   . Depression   . COPD (chronic obstructive pulmonary disease)   . Anxiety   . Gout   . Achilles tendinitis    Past Surgical History  Procedure Laterality Date  . Tubal ligation    . Cardiac catheterization  2006  . Colonoscopy N/A 12/25/2013    Procedure: COLONOSCOPY;  Surgeon: Rogene Houston, MD;  Location: [facility];  Service: Endoscopy;  Laterality: N/A;  730-rescheduled Ann notified pt   Family History  Problem Relation Age of Onset  . Heart disease Mother   . Thyroid disease Mother   . Heart failure Father   . Diabetes Brother   . Crohn's disease Daughter    History  Substance Use Topics  . Smoking status: Former Smoker -- 1.50 packs/day for 30 years    Types: Cigarettes   Quit date: 03/25/2005  . Smokeless tobacco: Never Used  . Alcohol Use: No   OB History   Grav Para Term Preterm Abortions TAB SAB Ect Mult Living   3 3 3       3      Review of Systems  Constitutional: Negative for fever and chills.  Respiratory: Negative for shortness of breath.   Cardiovascular: Negative for chest pain.  Gastrointestinal: Negative for nausea and vomiting.  Genitourinary: Negative for dysuria and difficulty urinating.  Musculoskeletal: Positive for arthralgias and joint swelling. Negative for back pain and neck pain.  Skin: Negative for color change and wound.  Psychiatric/Behavioral: The patient is not nervous/anxious.   All other systems reviewed and are negative.     Allergies  Review of patient's allergies indicates no known allergies.  Home Medications   Prior to Admission medications   Medication Sig Start Date End Date Taking? Authorizing Provider  allopurinol (ZYLOPRIM) 100 MG tablet Take 1 tablet (100 mg total) by mouth daily. 02/26/14   Alycia Rossetti, MD  aspirin EC 325 MG tablet Take 325 mg by mouth daily.    Historical Provider, MD  clotrimazole-betamethasone (LOTRISONE) cream Apply 1 application topically 2 (two) times daily as needed. To be applied to the feet as needed 02/26/14   Alycia Rossetti, MD  colchicine 0.6 MG tablet Take 0.6  mg by mouth daily.    Historical Provider, MD  fluticasone (CUTIVATE) 0.05 % cream Apply 1 application topically daily. 11/26/13   Alycia Rossetti, MD  furosemide (LASIX) 40 MG tablet Take 1 tablet (40 mg total) by mouth daily. 01/22/14   Alycia Rossetti, MD  HYDROcodone-acetaminophen (NORCO) 7.5-325 MG per tablet Take 1 tablet by mouth every 6 (six) hours as needed for moderate pain. 02/26/14   Alycia Rossetti, MD  losartan (COZAAR) 50 MG tablet Take 1 tablet (50 mg total) by mouth daily. 01/23/14   Susy Frizzle, MD  methocarbamol (ROBAXIN) 500 MG tablet TAKE 1 TABLET BY MOUTH TWICE DAILY AS NEEDED FOR MUSCLE SPASMS     Alycia Rossetti, MD  naproxen (NAPROSYN) 500 MG tablet Take 1 tablet (500 mg total) by mouth 2 (two) times daily. 02/06/14   Carin Shipp L. Jonee Lamore, PA-C  niacin (NIASPAN) 500 MG CR tablet Take 1 tablet (500 mg total) by mouth at bedtime. For cholesterol 02/26/14   Alycia Rossetti, MD  NON FORMULARY at bedtime. CPAP and 1 lpm oxygen qhs    Historical Provider, MD   BP 139/64  Pulse 83  Temp(Src) 98.2 F (36.8 C) (Oral)  Resp 22  Ht 5\' 6"  (1.676 m)  Wt 270 lb (122.471 kg)  BMI 43.60 kg/m2  SpO2 94% Physical Exam  Nursing note and vitals reviewed. Constitutional: She is oriented to person, place, and time. She appears well-developed and well-nourished. No distress.  HENT:  Head: Normocephalic and atraumatic.  Cardiovascular: Normal rate, regular rhythm, normal heart sounds and intact distal pulses.   No murmur heard. Pulmonary/Chest: Effort normal and breath sounds normal. No respiratory distress. She exhibits no tenderness.  Musculoskeletal: She exhibits edema and tenderness.  Mild erythema and edema of the medial aspect of the left great toe and dorsal foot.  hind foot is dry and scaly and pt has chronic appearing fungal infection to the toenails.  DP pulse is brisk, distal sensation intact.  No abrasion, bruising or bony deformity or open wounds to the foot.Marland Kitchen  No proximal tenderness or edema  Neurological: She is alert and oriented to person, place, and time. She exhibits normal muscle tone. Coordination normal.  Skin: Skin is warm and dry.    ED Course  Procedures (including critical care time) Labs Review Labs Reviewed - No data to display  Imaging Review No results found.   EKG Interpretation None      MDM   Final diagnoses:  Foot pain, left    Previous ed charts reviewed.    Pt is well appearing. Non-toxic  Has frequent ed visits for chronic foot pain and gout.  Has appt with her podiatrist early September.   ttp of the great toe with area of localized erythema.  I  will treat with naprosyn and bactrim.  Pain improved prior to d/c.  Pt has vicodin and allopurinol at home.  Appears stable for d/c    Nesreen Albano L. Vanessa [place], PA-C 03/13/14 2219

## 2014-03-12 NOTE — ED Notes (Addendum)
Pt states left foot pain since yesterday. No known injury. Pt states unable to bear weight. Pt states last time this happened, she had an ifection in her foot. Pt also states she has a podiatrist. Pt also has hx of gout.

## 2014-03-14 NOTE — ED Provider Notes (Signed)
Medical screening examination/treatment/procedure(s) were performed by non-physician practitioner and as supervising physician I was immediately available for consultation/collaboration.   EKG Interpretation None        Courtney F Horton, MD 03/14/14 1446 

## 2014-03-24 ENCOUNTER — Ambulatory Visit: Payer: Commercial Managed Care - HMO | Admitting: Family Medicine

## 2014-03-27 ENCOUNTER — Telehealth: Payer: Self-pay | Admitting: Family Medicine

## 2014-03-27 NOTE — Telephone Encounter (Signed)
[phone]  Pt is needing a refill on her HYDROcodone-acetaminophen (NORCO) 7.5-325 MG per tablet

## 2014-03-27 NOTE — Telephone Encounter (Signed)
Ok to refill??  Last office visit/ refill 02/26/2014.

## 2014-03-28 ENCOUNTER — Ambulatory Visit (INDEPENDENT_AMBULATORY_CARE_PROVIDER_SITE_OTHER): Payer: Medicare HMO | Admitting: Podiatry

## 2014-03-28 ENCOUNTER — Ambulatory Visit (INDEPENDENT_AMBULATORY_CARE_PROVIDER_SITE_OTHER): Payer: Medicare HMO

## 2014-03-28 VITALS — BP 209/99 | HR 66 | Resp 17 | Ht 66.0 in | Wt 280.0 lb

## 2014-03-28 DIAGNOSIS — M19079 Primary osteoarthritis, unspecified ankle and foot: Secondary | ICD-10-CM

## 2014-03-28 DIAGNOSIS — M79609 Pain in unspecified limb: Secondary | ICD-10-CM

## 2014-03-28 DIAGNOSIS — M79673 Pain in unspecified foot: Secondary | ICD-10-CM

## 2014-03-28 DIAGNOSIS — M79672 Pain in left foot: Secondary | ICD-10-CM

## 2014-03-28 DIAGNOSIS — B351 Tinea unguium: Secondary | ICD-10-CM

## 2014-03-28 MED ORDER — HYDROCODONE-ACETAMINOPHEN 7.5-325 MG PO TABS: 1.0000 | ORAL_TABLET | Freq: Four times a day (QID) | ORAL | Status: DC | PRN

## 2014-03-28 NOTE — Telephone Encounter (Signed)
Prescription printed and patient made aware to come to office to pick up.  

## 2014-03-28 NOTE — Telephone Encounter (Signed)
Okay to refill? 

## 2014-03-28 NOTE — Progress Notes (Signed)
   Subjective:    Patient ID: [name], female    DOB: 10/17/1950, 63 y.o.   MRN: [id number]  HPI Comments: [age]-year-old female presents the office today with complaints of left foot pain which has been ongoing for approximately one month. He states that she went to the emergency room last month for the foot pain for which she was prescribed Bactrim and naproxen. She states that the outside aspect of her left foot was swollen, red, painful. She states that the pain has improved as well as swelling. Also states that the redness is no longer present and his left a darkened area over the area. She states that she completed the course of the Bactrim however she does not take naproxen as it upsets her stomach. She is able to ambulate without difficulty. He is no injury to the area. Also states that she has had a history of gout in the left big toe for which he takes allopurinol for. She was last seen a couple months ago in the office by Dr. Blenda Mounts for retrocalcaneal exostosis and Achilles tendinitis. Today she has no complaints of pain over these areas.Lastly, she states she has noticed discoloration and thickening to her nails which are symptomatic in shoegear. Denies any drainage, erythema from the area.  No other complaints at this time. Denies any systemic complaints such as fevers, chills, nausea, vomiting.  Foot Pain Associated symptoms include arthralgias and fatigue.      Review of Systems  Constitutional: Positive for fatigue.  Musculoskeletal: Positive for arthralgias, back pain and gait problem.  All other systems reviewed and are negative.      Objective:   Physical Exam AAO x3, NAD ambulating with a cane DP/PT pulses palpable b/l. CRT < 3sec To sensation intact with Derrel Nip monofilament, vibratory sensation intact, Achilles tendon reflex intact. Left foot mild edema over the lateral aspect. There is slightly darkened area from the resolving cellulitis spot. There is  currently no erythema, cellulitis, fluctuance, crepitus, open lesions. There is no increased warmth to the area. Mild tenderness over the dorsal midfoot. No pinpoint bony tenderness and no pain with vibratory sensation over the area. Foot exostosis as well as retrocalcaneal exostosis.  No pain along the calcaneus or along the Achilles tendon. ROM decreased in the ankle/subtalar joint, no pain with ROM.  Crease in medial arch height bilaterally, point is present to Nails are hypertrophic, dystrophic, elongated without any evidence of surrounding erythema or drainage. No leg pain/warmth/edema        Assessment & Plan:  63 year old female with likely result infection as the patient responded to antibiotics, midfoot pain over the dorsal exostosis -Currently the patient states that the areas which she went to the emergency room for are improved. -X-Rays were obtained and reviewed with the patient. -Conservative versus surgical intervention were discussed including alternatives, risks, complications. -Recommended supportive shoe gear and orthotics to help support her foot structure. -Nails sharply debrided [id number] without complications. -Discussed with her at great length that if her foot were to get more swollen, painful, red call the office immediately. -Followup as needed. Call with any questions or concerns or any change in symptoms.

## 2014-04-01 ENCOUNTER — Emergency Department (HOSPITAL_COMMUNITY)
Admission: EM | Admit: 2014-04-01 | Discharge: 2014-04-01 | Disposition: A | Discharge status: Home / Self Care | Payer: Medicare HMO | Attending: Emergency Medicine | Admitting: Emergency Medicine

## 2014-04-01 ENCOUNTER — Encounter (HOSPITAL_COMMUNITY): Payer: Self-pay | Admitting: Emergency Medicine

## 2014-04-01 ENCOUNTER — Emergency Department (HOSPITAL_COMMUNITY): Payer: Medicare HMO

## 2014-04-01 DIAGNOSIS — J4489 Other specified chronic obstructive pulmonary disease: Secondary | ICD-10-CM | POA: Insufficient documentation

## 2014-04-01 DIAGNOSIS — Z79899 Other long term (current) drug therapy: Secondary | ICD-10-CM | POA: Insufficient documentation

## 2014-04-01 DIAGNOSIS — Z7982 Long term (current) use of aspirin: Secondary | ICD-10-CM | POA: Insufficient documentation

## 2014-04-01 DIAGNOSIS — M109 Gout, unspecified: Secondary | ICD-10-CM | POA: Diagnosis not present

## 2014-04-01 DIAGNOSIS — IMO0002 Reserved for concepts with insufficient information to code with codable children: Secondary | ICD-10-CM | POA: Diagnosis not present

## 2014-04-01 DIAGNOSIS — M1990 Unspecified osteoarthritis, unspecified site: Secondary | ICD-10-CM | POA: Insufficient documentation

## 2014-04-01 DIAGNOSIS — Z87891 Personal history of nicotine dependence: Secondary | ICD-10-CM | POA: Insufficient documentation

## 2014-04-01 DIAGNOSIS — I10 Essential (primary) hypertension: Secondary | ICD-10-CM | POA: Insufficient documentation

## 2014-04-01 DIAGNOSIS — M25569 Pain in unspecified knee: Secondary | ICD-10-CM | POA: Insufficient documentation

## 2014-04-01 DIAGNOSIS — Z8719 Personal history of other diseases of the digestive system: Secondary | ICD-10-CM | POA: Insufficient documentation

## 2014-04-01 DIAGNOSIS — Z8659 Personal history of other mental and behavioral disorders: Secondary | ICD-10-CM | POA: Diagnosis not present

## 2014-04-01 DIAGNOSIS — Z8669 Personal history of other diseases of the nervous system and sense organs: Secondary | ICD-10-CM | POA: Diagnosis not present

## 2014-04-01 DIAGNOSIS — Z9889 Other specified postprocedural states: Secondary | ICD-10-CM | POA: Diagnosis not present

## 2014-04-01 DIAGNOSIS — E662 Morbid (severe) obesity with alveolar hypoventilation: Secondary | ICD-10-CM | POA: Diagnosis not present

## 2014-04-01 DIAGNOSIS — Z791 Long term (current) use of non-steroidal anti-inflammatories (NSAID): Secondary | ICD-10-CM | POA: Diagnosis not present

## 2014-04-01 DIAGNOSIS — J449 Chronic obstructive pulmonary disease, unspecified: Secondary | ICD-10-CM | POA: Insufficient documentation

## 2014-04-01 DIAGNOSIS — M25561 Pain in right knee: Secondary | ICD-10-CM

## 2014-04-01 MED ORDER — PREDNISONE 10 MG PO TABS: 20.0000 mg | ORAL_TABLET | Freq: Two times a day (BID) | ORAL | Status: DC

## 2014-04-01 MED ORDER — OXYCODONE-ACETAMINOPHEN 5-325 MG PO TABS
1.0000 | ORAL_TABLET | Freq: Once | ORAL | Status: AC
  Administered 2014-04-01: 1 via ORAL
  Filled 2014-04-01: qty 1

## 2014-04-01 NOTE — ED Notes (Signed)
Right knee pain x 5 days.  Denies injury.

## 2014-04-01 NOTE — Discharge Instructions (Signed)
Continue your hydrocodone for pain and take the medication we give you as directed. Follow up with your doctor. Return here as needed.

## 2014-04-01 NOTE — ED Provider Notes (Signed)
CSN: [id number]     Arrival date & time 04/01/14  0746 History   First MD Initiated Contact with Patient 04/01/14 [phone]     Chief Complaint  Patient presents with  . Knee Pain     (Consider location/radiation/quality/duration/timing/severity/associated sxs/prior Treatment) Patient is a 63 y.o. female presenting with knee pain. The history is provided by the patient.  Knee Pain Location:  Knee Time since incident:  5 days Injury: no   Knee location:  R knee Pain details:    Quality:  Throbbing   Radiates to:  Does not radiate   Severity:  Severe   Onset quality:  Gradual   Timing:  Constant   Progression:  Worsening Chronicity:  New Dislocation: no   Foreign body present:  No foreign bodies Prior injury to area:  No Relieved by:  Nothing Worsened by:  Bearing weight and activity Ineffective treatments:  NSAIDs (hydrocodone) Associated symptoms: decreased ROM and swelling   Risk factors: obesity    [name] is a 63 y.o. female who presents to the ED with right knee pain that started 5 days ago. She does not remember any injury to the knee. She does have a history of gout but it is usually in her feet.  Past Medical History  Diagnosis Date  . Obesity hypoventilation syndrome   . Obstructive sleep apnea   . Osteoarthritis   . Low back pain   . HTN (hypertension)   . Hyperlipidemia   . GERD (gastroesophageal reflux disease)   . Depression   . COPD (chronic obstructive pulmonary disease)   . Anxiety   . Gout   . Achilles tendinitis    Past Surgical History  Procedure Laterality Date  . Tubal ligation    . Cardiac catheterization  2006  . Colonoscopy N/A 12/25/2013    Procedure: COLONOSCOPY;  Surgeon: Rogene Houston, MD;  Location: [facility];  Service: Endoscopy;  Laterality: N/A;  730-rescheduled Ann notified pt   Family History  Problem Relation Age of Onset  . Heart disease Mother   . Thyroid disease Mother   . Heart failure Father   . Diabetes  Brother   . Crohn's disease Daughter    History  Substance Use Topics  . Smoking status: Former Smoker -- 1.50 packs/day for 30 years    Types: Cigarettes    Quit date: 03/25/2005  . Smokeless tobacco: Never Used  . Alcohol Use: No   OB History   Grav Para Term Preterm Abortions TAB SAB Ect Mult Living   3 3 3       3      Review of Systems Negative except as stated in HPI   Allergies  Review of patient's allergies indicates no known allergies.  Home Medications   Prior to Admission medications   Medication Sig Start Date End Date Taking? Authorizing Provider  allopurinol (ZYLOPRIM) 100 MG tablet Take 1 tablet (100 mg total) by mouth daily. 02/26/14   Alycia Rossetti, MD  aspirin EC 325 MG tablet Take 325 mg by mouth daily.    Historical Provider, MD  clotrimazole-betamethasone (LOTRISONE) cream Apply 1 application topically 2 (two) times daily as needed. To be applied to the feet as needed 02/26/14   Alycia Rossetti, MD  fluticasone (CUTIVATE) 0.05 % cream Apply 1 application topically daily. 11/26/13   Alycia Rossetti, MD  furosemide (LASIX) 40 MG tablet Take 1 tablet (40 mg total) by mouth daily. 01/22/14   Modena Nunnery  [place], MD  HYDROcodone-acetaminophen (NORCO) 7.5-325 MG per tablet Take 1 tablet by mouth every 6 (six) hours as needed for moderate pain. 03/28/14   Alycia Rossetti, MD  losartan (COZAAR) 50 MG tablet Take 1 tablet (50 mg total) by mouth daily. 01/23/14   Susy Frizzle, MD  methocarbamol (ROBAXIN) 500 MG tablet Take 500 mg by mouth 2 (two) times daily as needed for muscle spasms.    Historical Provider, MD  naproxen (NAPROSYN) 500 MG tablet Take 1 tablet (500 mg total) by mouth 2 (two) times daily. 03/12/14   Tammy L. Triplett, PA-C  NON FORMULARY at bedtime. CPAP and 1 lpm oxygen qhs    Historical Provider, MD  sulfamethoxazole-trimethoprim (SEPTRA DS) 800-160 MG per tablet Take 1 tablet by mouth 2 (two) times daily. For 10 days 03/12/14   Tammy L. Triplett, PA-C    BP 165/78  Pulse 71  Temp(Src) 98.3 F (36.8 C) (Oral)  Resp 18  Ht 5\' 5"  (1.651 m)  Wt 275 lb (124.739 kg)  BMI 45.76 kg/m2  SpO2 96% Physical Exam  Nursing note and vitals reviewed. Constitutional: She is oriented to person, place, and time. She appears well-developed and well-nourished.  HENT:  Head: Normocephalic.  Eyes: EOM are normal.  Neck: Neck supple.  Cardiovascular: Normal rate.   Pulmonary/Chest: Effort normal.  Musculoskeletal:       Right knee: She exhibits swelling. She exhibits no deformity, no laceration and no erythema. Decreased range of motion: due to pain. Tenderness found.       Legs: Pedal pulse 2+, adequate circulation, good touch sensation. Pain with palpation of the anterior knee and with flexion.   Neurological: She is alert and oriented to person, place, and time. No cranial nerve deficit.  Skin: Skin is warm and dry.  Psychiatric: She has a normal mood and affect. Her behavior is normal.    ED Course  Procedures  Pain management, knee immobilizer, follow up with PCP MDM  Dg Knee Complete 4 Views Right  04/01/2014   CLINICAL DATA:  Right knee pain  EXAM: RIGHT KNEE - COMPLETE 4+ VIEW  COMPARISON:  None.  FINDINGS: There is no evidence of fracture, dislocation, or joint effusion. Minimal osteoarthritic changes of the medial and lateral femorotibial compartments. Enthesopathic changes at the quadriceps tendon insertion and patellar tendon origin. Soft tissues are unremarkable. There is peripheral vascular atherosclerotic disease.  IMPRESSION: No acute osseous injury of the right knee.   Electronically Signed   By: Kathreen Devoid   On: 04/01/2014 08:29   63 y.o. female with right knee pain without known injury. Hx of gout. Will treat with steroids and knee immobilizer. She will follow up with her PCP. Stable for discharge without neurovascular deficits.  Discussed with the patient and all questioned fully answered.    Medication List    TAKE these  medications       predniSONE 10 MG tablet  Commonly known as:  DELTASONE  Take 2 tablets (20 mg total) by mouth 2 (two) times daily with a meal.      ASK your doctor about these medications       allopurinol 100 MG tablet  Commonly known as:  ZYLOPRIM  Take 1 tablet (100 mg total) by mouth daily.     aspirin EC 325 MG tablet  Take 325 mg by mouth daily.     clotrimazole-betamethasone cream  Commonly known as:  LOTRISONE  Apply 1 application topically 2 (two) times daily as  needed. To be applied to the feet as needed     fluticasone 0.05 % cream  Commonly known as:  CUTIVATE  Apply 1 application topically daily.     furosemide 40 MG tablet  Commonly known as:  LASIX  Take 1 tablet (40 mg total) by mouth daily.     HYDROcodone-acetaminophen 7.5-325 MG per tablet  Commonly known as:  NORCO  Take 1 tablet by mouth every 6 (six) hours as needed for moderate pain.     losartan 50 MG tablet  Commonly known as:  COZAAR  Take 1 tablet (50 mg total) by mouth daily.     methocarbamol 500 MG tablet  Commonly known as:  ROBAXIN  Take 500 mg by mouth 2 (two) times daily as needed for muscle spasms.     naproxen 500 MG tablet  Commonly known as:  NAPROSYN  Take 1 tablet (500 mg total) by mouth 2 (two) times daily.     NON FORMULARY  at bedtime. CPAP and 1 lpm oxygen qhs     sulfamethoxazole-trimethoprim 800-160 MG per tablet  Commonly known as:  SEPTRA DS  Take 1 tablet by mouth 2 (two) times daily. For 10 days            [facility], [place] 04/01/14 [id number]

## 2014-04-02 ENCOUNTER — Ambulatory Visit (INDEPENDENT_AMBULATORY_CARE_PROVIDER_SITE_OTHER): Payer: Medicare HMO | Admitting: Critical Care Medicine

## 2014-04-02 ENCOUNTER — Other Ambulatory Visit: Payer: Self-pay | Admitting: Family Medicine

## 2014-04-02 ENCOUNTER — Encounter: Payer: Self-pay | Admitting: Critical Care Medicine

## 2014-04-02 VITALS — BP 146/80 | HR 71 | Temp 97.6°F | Ht 65.5 in | Wt 273.6 lb

## 2014-04-02 DIAGNOSIS — Z23 Encounter for immunization: Secondary | ICD-10-CM

## 2014-04-02 DIAGNOSIS — J438 Other emphysema: Secondary | ICD-10-CM

## 2014-04-02 DIAGNOSIS — J432 Centrilobular emphysema: Secondary | ICD-10-CM

## 2014-04-02 MED ORDER — TIOTROPIUM BROMIDE MONOHYDRATE 18 MCG IN CAPS: 18.0000 ug | ORAL_CAPSULE | Freq: Every day | RESPIRATORY_TRACT | Status: DC

## 2014-04-02 NOTE — Progress Notes (Signed)
Subjective:    Patient ID: [name], female    DOB: 1950-09-02, 63 y.o.   MRN: [id number]  HPI   63 -year-old [place] American female with a history of obstructive sleep apnea, chronic obstructive lung disease, asthmatic bronchitis.   04/02/2014 Chief Complaint  Patient presents with  . Follow-up    Coughing more on some days than others - nonprod.  SOB is at baseline.  No wheezing or chest tightness/pain.  Pt has been to ED 5 times in 6 months.  Going d/t pain in R knee, also L foot pain and gout. PCP is [facility].  Sees foot Dr who says go to ED.  Notes more cough some days.  Now on pain med and pred high dose for knee pain Not now on any inhalers . Cannot afford spiriva.    Review of Systems  11 pt ros neg except as above in hpi    Objective:   Physical Exam   Filed Vitals:   04/02/14 1102  BP: 146/80  Pulse: 71  Temp: 97.6 F (36.4 C)  TempSrc: Oral  Height: 5' 5.5" (1.664 m)  Weight: 273 lb 9.6 oz (124.104 kg)  SpO2: 95%    Gen: Pleasant, obese in no distress,  normal affect  ENT: No lesions,  mouth clear,  oropharynx clear, no postnasal drip  Neck: No JVD, no TMG, no carotid bruits  Lungs: No use of accessory muscles, no dullness to percussion, distant breath sounds without wheezes or rhonchi  Cardiovascular: RRR, heart sounds normal, no murmur or gallops, no peripheral edema  Abdomen: soft and NT, no HSM,  BS normal  Musculoskeletal: No deformities, no cyanosis or clubbing  Neuro: alert, non focal  Skin: Warm, no lesions or rashes  No results found.       Assessment & Plan:   COPD with emphysema gold stage B COPD gold stage B.  patient has not been using inhaled medications on regular basis Plan Flu vaccine was given Resume Spiriva , use samples Stay on cpap and oxygen at night Return 6 months    Morbid obesity We discussed dietary options at this visit Updated Medication List Outpatient Encounter Prescriptions as of 04/02/2014   Medication Sig  . allopurinol (ZYLOPRIM) 100 MG tablet Take 1 tablet (100 mg total) by mouth daily.  Marland Kitchen aspirin EC 325 MG tablet Take 325 mg by mouth daily.  . clotrimazole-betamethasone (LOTRISONE) cream Apply 1 application topically 2 (two) times daily as needed. To be applied to the feet as needed  . fluticasone (CUTIVATE) 0.05 % cream Apply 1 application topically daily.  . furosemide (LASIX) 40 MG tablet Take 1 tablet (40 mg total) by mouth daily.  Marland Kitchen HYDROcodone-acetaminophen (NORCO) 7.5-325 MG per tablet Take 1 tablet by mouth every 6 (six) hours as needed for moderate pain.  Marland Kitchen losartan (COZAAR) 50 MG tablet Take 1 tablet (50 mg total) by mouth daily.  . methocarbamol (ROBAXIN) 500 MG tablet Take 500 mg by mouth 2 (two) times daily as needed for muscle spasms.  . naproxen (NAPROSYN) 500 MG tablet Take 1 tablet (500 mg total) by mouth 2 (two) times daily.  . NON FORMULARY at bedtime. CPAP and 1 lpm oxygen qhs  . predniSONE (DELTASONE) 10 MG tablet Take 2 tablets (20 mg total) by mouth 2 (two) times daily with a meal.  . tiotropium (SPIRIVA HANDIHALER) 18 MCG inhalation capsule Place 1 capsule (18 mcg total) into inhaler and inhale daily.  . [DISCONTINUED] sulfamethoxazole-trimethoprim (SEPTRA DS) 800-160  MG per tablet Take 1 tablet by mouth 2 (two) times daily. For 10 days

## 2014-04-02 NOTE — Telephone Encounter (Signed)
ok 

## 2014-04-02 NOTE — ED Provider Notes (Signed)
Medical screening examination/treatment/procedure(s) were conducted as a shared visit with non-physician practitioner(s) and myself.  I personally evaluated the patient during the encounter.   EKG Interpretation None     X-ray right knee shows no fracture. Will treat for inflammatory flare. Referral to orthopedics.  Nat Christen, MD 04/02/14 [phone]

## 2014-04-02 NOTE — Patient Instructions (Signed)
Flu vaccine was given Resume Spiriva , use samples Stay on cpap and oxygen at night Return 6 months

## 2014-04-02 NOTE — Telephone Encounter (Signed)
?   OK to Refill  

## 2014-04-03 NOTE — Telephone Encounter (Signed)
Prescription sent to pharmacy.

## 2014-04-03 NOTE — Assessment & Plan Note (Signed)
COPD gold stage B.  patient has not been using inhaled medications on regular basis Plan Flu vaccine was given Resume Spiriva , use samples Stay on cpap and oxygen at night Return 6 months

## 2014-04-09 ENCOUNTER — Ambulatory Visit (INDEPENDENT_AMBULATORY_CARE_PROVIDER_SITE_OTHER): Payer: Medicare HMO | Admitting: Family Medicine

## 2014-04-09 ENCOUNTER — Encounter: Payer: Self-pay | Admitting: Family Medicine

## 2014-04-09 VITALS — BP 132/68 | HR 72 | Temp 97.7°F | Resp 16 | Ht 64.5 in | Wt 278.0 lb

## 2014-04-09 DIAGNOSIS — M1711 Unilateral primary osteoarthritis, right knee: Secondary | ICD-10-CM

## 2014-04-09 DIAGNOSIS — R7302 Impaired glucose tolerance (oral): Secondary | ICD-10-CM

## 2014-04-09 DIAGNOSIS — M1710 Unilateral primary osteoarthritis, unspecified knee: Secondary | ICD-10-CM

## 2014-04-09 DIAGNOSIS — R7309 Other abnormal glucose: Secondary | ICD-10-CM

## 2014-04-09 DIAGNOSIS — M179 Osteoarthritis of knee, unspecified: Secondary | ICD-10-CM | POA: Insufficient documentation

## 2014-04-09 MED ORDER — FUROSEMIDE 40 MG PO TABS: 40.0000 mg | ORAL_TABLET | Freq: Every day | ORAL | Status: DC

## 2014-04-09 MED ORDER — ALLOPURINOL 100 MG PO TABS: 100.0000 mg | ORAL_TABLET | Freq: Every day | ORAL | Status: DC

## 2014-04-09 MED ORDER — NAPROXEN 500 MG PO TABS: 500.0000 mg | ORAL_TABLET | Freq: Two times a day (BID) | ORAL | Status: DC

## 2014-04-09 NOTE — Assessment & Plan Note (Signed)
Gout is much improved with allopurinol , no recent flares

## 2014-04-09 NOTE — Assessment & Plan Note (Signed)
Mild osteoarthritis of the right knee. Discuss possible steroid injection by her pain is much improved now she would like to hold off. We will hold orthopedics as well as I do not think there is a lot they can do at this time. Weight loss would benefit her. She will continue the Naprosyn as needed

## 2014-04-09 NOTE — Progress Notes (Signed)
Patient ID: [name], female   DOB: [birth year], 63 y.o.   MRN: [id number]   Subjective:    Patient ID: [name], female    DOB: 1950-09-29, 63 y.o.   MRN: [id number]  Patient presents for Hospital F/U and Referral    Patient here for hospital followup she was seen secondary to right knee pain which she's had on and off x-ray was done which showed mild arthritis she does not have any significant effusion she was given prednisone for a few days chart he had hydrocodone as well as some Naprosyn. She started taking the Naprosyn this morning states that her knee pain is much improved she does not have any pain. She was wondering if she needed a referral to orthopedist but will like to hold off. She was seen by her pulmonologist yesterday she is back on her Spiriva for her COPD.  Gallagher gout has been under very good control she's not had any problems with her foot recently she is taking the allopurinol as prescribed. She does state that she would like to be elicited in a [employer] research study and they will be sending me some paperwork    Review Of Systems:  GEN- denies fatigue, fever, weight loss,weakness, recent illness HEENT- denies eye drainage, change in vision, nasal discharge, CVS- denies chest pain, palpitations RESP- denies SOB, cough, wheeze ABD- denies N/V, change in stools, abd pain GU- denies dysuria, hematuria, dribbling, incontinence MSK- + joint pain, muscle aches, injury Neuro- denies headache, dizziness, syncope, seizure activity       Objective:    BP 132/68  Pulse 72  Temp(Src) 97.7 F (36.5 C) (Oral)  Resp 16  Ht 5' 4.5" (1.638 m)  Wt 278 lb (126.1 kg)  BMI 47.00 kg/m2 GEN- NAD, alert and oriented x3 CVS- RRR, no murmur RESP-CTAB MSK- Bilat knee normal inspection, no effusion, Right knee- NT, Fair ROM EXT- trace pedal edema Pulses- Radial, DP- 2+        Assessment & Plan:      Problem List Items Addressed This Visit   None      Note: This dictation was prepared with Dragon dictation along with smaller phrase technology. Any transcriptional errors that result from this process are unintentional.

## 2014-04-09 NOTE — Patient Instructions (Signed)
Continue naprosyn twice a day for your knee Continue all other medications Call if you have knee pain again F/U November as scheduled

## 2014-04-11 ENCOUNTER — Telehealth: Payer: Self-pay | Admitting: *Deleted

## 2014-04-11 NOTE — Telephone Encounter (Signed)
Pt has appt for DEXA scan on Sept 22 at 330pm at [facility] in [place] pt is aware.

## 2014-04-15 ENCOUNTER — Other Ambulatory Visit (HOSPITAL_COMMUNITY): Payer: Medicare HMO

## 2014-04-17 ENCOUNTER — Ambulatory Visit (HOSPITAL_COMMUNITY)
Admission: RE | Admit: 2014-04-17 | Discharge: 2014-04-17 | Disposition: A | Discharge status: Home / Self Care | Payer: Medicare HMO | Source: Ambulatory Visit | Attending: Family Medicine | Admitting: Family Medicine

## 2014-04-17 DIAGNOSIS — Z780 Asymptomatic menopausal state: Secondary | ICD-10-CM

## 2014-04-21 ENCOUNTER — Telehealth: Payer: Self-pay | Admitting: Family Medicine

## 2014-04-21 ENCOUNTER — Encounter: Payer: Self-pay | Admitting: *Deleted

## 2014-04-21 NOTE — Telephone Encounter (Signed)
Call placed to patient. No answer. No VM.  

## 2014-04-21 NOTE — Telephone Encounter (Signed)
Patient left message with [place] regarding [employer] not covering one of her prescriptions, i did not understand which medication it was please call her back at [phone]

## 2014-04-22 ENCOUNTER — Telehealth: Payer: Self-pay | Admitting: Family Medicine

## 2014-04-22 MED ORDER — HYDROCODONE-ACETAMINOPHEN 7.5-325 MG PO TABS: 1.0000 | ORAL_TABLET | Freq: Four times a day (QID) | ORAL | Status: DC | PRN

## 2014-04-22 NOTE — Telephone Encounter (Signed)
Ok to refill??  Last office visit 04/09/2014.  Last refill 03/28/2014.

## 2014-04-22 NOTE — Telephone Encounter (Signed)
Prescription printed and patient made aware to come to office to pick up.  

## 2014-04-22 NOTE — Telephone Encounter (Signed)
Okay to refill? 

## 2014-04-22 NOTE — Telephone Encounter (Signed)
Call returned to patient.   Reports that she received letter from [facility] stating that Robaxin is no longer covered by insurance.   MD please advise.

## 2014-04-22 NOTE — Telephone Encounter (Signed)
[phone]  Pt is needing a refill on HYDROcodone-acetaminophen (NORCO) 7.5-325 MG per tablet

## 2014-04-23 ENCOUNTER — Ambulatory Visit (INDEPENDENT_AMBULATORY_CARE_PROVIDER_SITE_OTHER): Payer: Medicare HMO | Admitting: Family Medicine

## 2014-04-23 ENCOUNTER — Encounter: Payer: Self-pay | Admitting: *Deleted

## 2014-04-23 ENCOUNTER — Encounter: Payer: Self-pay | Admitting: Family Medicine

## 2014-04-23 VITALS — BP 138/84 | HR 68 | Temp 97.6°F | Resp 14 | Ht 64.5 in | Wt 278.0 lb

## 2014-04-23 DIAGNOSIS — M1990 Unspecified osteoarthritis, unspecified site: Secondary | ICD-10-CM

## 2014-04-23 MED ORDER — TIZANIDINE HCL 4 MG PO TABS: 4.0000 mg | ORAL_TABLET | Freq: Two times a day (BID) | ORAL | Status: DC | PRN

## 2014-04-23 NOTE — Progress Notes (Signed)
Patient ID: [name], female   DOB: 1950/10/24, 63 y.o.   MRN: [id number]   Subjective:    Patient ID: [name], female    DOB: 06-17-1951, 63 y.o.   MRN: [id number]  Patient presents for R Knee Pain  patient here with recurrent right knee pain. She was in the ER about 2 weeks ago I saw her for followup visit x-ray chest some arthritis in the medial and lateral compartments. She was doing fairly well the Naprosyn but last night she had increased pain even while she was sleeping she did not notice any significant swelling but the knee is been stiff and she would like to try the steroid injection.     Review Of Systems:  GEN- denies fatigue, fever, weight loss,weakness, recent illness HEENT- denies eye drainage, change in vision, nasal discharge, CVS- denies chest pain, palpitations RESP- denies SOB, cough, wheeze MSK- + joint pain, muscle aches, injury Neuro- denies headache, dizziness, syncope, seizure activity       Objective:    BP 138/84  Pulse 68  Temp(Src) 97.6 F (36.4 C) (Oral)  Resp 14  Ht 5' 4.5" (1.638 m)  Wt 278 lb (126.1 kg)  BMI 47.00 kg/m2 GEN- NAD, alert and oriented x3 MSK- Bilat knee normal inspection, no effusion, Right knee- NT, Fair ROM, no effusion, no warmth EXT- trace pedal edema  Procedure- Knee injection- Right Knee Procedure explained to patient questions answered benefits and risks discussed Verbal consent obtained. Antiseptic-Betadine Injection- Kenalog 40mg - 1 ml, Lidocaine 1%42ml  , Marcaine 1% 66ml Minimal blood loss Patient tolerated procedure well Bandage applied         Assessment & Plan:      Problem List Items Addressed This Visit   None      Note: This dictation was prepared with Dragon dictation along with smaller phrase technology. Any transcriptional errors that result from this process are unintentional.

## 2014-04-23 NOTE — Telephone Encounter (Signed)
See if she still needs muscle relaxer, they may cover Zanaflex 4mg  BID prn  Medicare has now taken most muscle relaxers off of the formulary, if they dont cover zanaflex she has to pay out of pocket

## 2014-04-23 NOTE — Assessment & Plan Note (Signed)
OA of knee- steroid injection, pain meds refilled

## 2014-04-23 NOTE — Telephone Encounter (Signed)
Patient in office to pick up prescription and have knee injection.   Reports that she continues to require muscle relaxant.   Zanaflex sent to pharmacy.

## 2014-04-23 NOTE — Patient Instructions (Signed)
F/U as previous Use ICE Take pain medication  Knee Injection Joint injections are shots. Your caregiver will place a needle into your knee joint. The needle is used to put medicine into the joint. These shots can be used to help treat different painful knee conditions such as osteoarthritis, bursitis, local flare-ups of rheumatoid arthritis, and pseudogout. Anti-inflammatory medicines such as corticosteroids and anesthetics are the most common medicines used for joint and soft tissue injections.  PROCEDURE  The skin over the kneecap will be cleaned with an antiseptic solution.  Your caregiver will inject a small amount of a local anesthetic (a medicine like Novocaine) just under the skin in the area that was cleaned.  After the area becomes numb, a second injection is done. This second injection usually includes an anesthetic and an anti-inflammatory medicine called a steroid or cortisone. The needle is carefully placed in between the kneecap and the knee, and the medicine is injected into the joint space.  After the injection is done, the needle is removed. Your caregiver may place a bandage over the injection site. The whole procedure takes no more than a couple of minutes. BEFORE THE PROCEDURE  Wash all of the skin around the entire knee area. Try to remove any loose, scaling skin. There is no other specific preparation necessary unless advised otherwise by your caregiver. LET YOUR CAREGIVER KNOW ABOUT:   Allergies.  Medications taken including herbs, eye drops, over the counter medications, and creams.  Use of steroids (by mouth or creams).  Possible pregnancy, if applicable.  Previous problems with anesthetics or Novocaine.  History of blood clots (thrombophlebitis).  History of bleeding or blood problems.  Previous surgery.  Other health problems. RISKS AND COMPLICATIONS Side effects from cortisone shots are rare. They include:   Slight bruising of the skin.  Shrinkage  of the normal fatty tissue under the skin where the shot was given.  Increase in pain after the shot.  Infection.  Weakening of tendons or tendon rupture.  Allergic reaction to the medicine.  Diabetics may have a temporary increase in their blood sugar after a shot.  Cortisone can temporarily weaken the immune system. While receiving these shots, you should not get certain vaccines. Also, avoid contact with anyone who has chickenpox or measles. Especially if you have never had these diseases or have not been previously immunized. Your immune system may not be strong enough to fight off the infection while the cortisone is in your system. AFTER THE PROCEDURE   You can go home after the procedure.  You may need to put ice on the joint 15-20 minutes every 3 or 4 hours until the pain goes away.  You may need to put an elastic bandage on the joint. HOME CARE INSTRUCTIONS   Only take over-the-counter or prescription medicines for pain, discomfort, or fever as directed by your caregiver.  You should avoid stressing the joint. Unless advised otherwise, avoid activities that put a lot of pressure on a knee joint, such as:  Jogging.  Bicycling.  Recreational climbing.  Hiking.  Laying down and elevating the leg/knee above the level of your heart can help to minimize swelling. SEEK MEDICAL CARE IF:   You have repeated or worsening swelling.  There is drainage from the puncture area.  You develop red streaking that extends above or below the site where the needle was inserted. SEEK IMMEDIATE MEDICAL CARE IF:   You develop a fever.  You have pain that gets worse even though  you are taking pain medicine.  The area is red and warm, and you have trouble moving the joint. MAKE SURE YOU:   Understand these instructions.  Will watch your condition.  Will get help right away if you are not doing well or get worse. Document Released: 10/02/2006 Document Revised: 10/03/2011 Document  Reviewed: 06/29/2007 [facility] Patient Information 2015 [place], [place]. This information is not intended to replace advice given to you by your health care provider. Make sure you discuss any questions you have with your health care provider.

## 2014-05-07 ENCOUNTER — Telehealth: Payer: Self-pay | Admitting: Critical Care Medicine

## 2014-05-07 DIAGNOSIS — J432 Centrilobular emphysema: Secondary | ICD-10-CM

## 2014-05-07 NOTE — Telephone Encounter (Signed)
Pt states she received a letter from [facility] stating she is required to have a blood gas study done plus an Oxygen test-pt uses uses O2 at night. Pt aware that I will send message to PW to approval patient having ABG done and order ONO.

## 2014-05-08 NOTE — Telephone Encounter (Signed)
Attempted to call pt but no machine and no answer.  Will try back later 

## 2014-05-08 NOTE — Telephone Encounter (Signed)
i am ok for abg on RA and ONO on RA

## 2014-05-08 NOTE — Telephone Encounter (Signed)
Spoke with patient-- aware of rec's per PW Orders placed.   Will send to [facility] to ensure patient gets scheduled for both tests-- ONO & ABG . Please advise thanks.

## 2014-05-08 NOTE — Telephone Encounter (Signed)
Pt returned call [phone]

## 2014-05-09 ENCOUNTER — Telehealth: Payer: Self-pay | Admitting: Family Medicine

## 2014-05-09 NOTE — Telephone Encounter (Signed)
Pt called and appt scheduled

## 2014-05-09 NOTE — Telephone Encounter (Signed)
Patient states she is having a menstrual cycle.  started with moderately heavy vaginal bleeding yesterday has seen a few small clots.  Denies any pain or discomfort.  Please advise.

## 2014-05-09 NOTE — Telephone Encounter (Signed)
Please schedule for a visit for GYN exam

## 2014-05-11 ENCOUNTER — Emergency Department (HOSPITAL_COMMUNITY)
Admission: EM | Admit: 2014-05-11 | Discharge: 2014-05-12 | Disposition: A | Discharge status: Home / Self Care | Payer: Medicare HMO | Attending: Emergency Medicine | Admitting: Emergency Medicine

## 2014-05-11 ENCOUNTER — Emergency Department (HOSPITAL_COMMUNITY): Payer: Medicare HMO

## 2014-05-11 ENCOUNTER — Encounter (HOSPITAL_COMMUNITY): Payer: Self-pay | Admitting: Emergency Medicine

## 2014-05-11 DIAGNOSIS — Z87891 Personal history of nicotine dependence: Secondary | ICD-10-CM | POA: Diagnosis not present

## 2014-05-11 DIAGNOSIS — N939 Abnormal uterine and vaginal bleeding, unspecified: Secondary | ICD-10-CM | POA: Diagnosis not present

## 2014-05-11 DIAGNOSIS — F329 Major depressive disorder, single episode, unspecified: Secondary | ICD-10-CM | POA: Diagnosis not present

## 2014-05-11 DIAGNOSIS — J449 Chronic obstructive pulmonary disease, unspecified: Secondary | ICD-10-CM | POA: Diagnosis not present

## 2014-05-11 DIAGNOSIS — E662 Morbid (severe) obesity with alveolar hypoventilation: Secondary | ICD-10-CM | POA: Insufficient documentation

## 2014-05-11 DIAGNOSIS — I10 Essential (primary) hypertension: Secondary | ICD-10-CM | POA: Insufficient documentation

## 2014-05-11 DIAGNOSIS — M109 Gout, unspecified: Secondary | ICD-10-CM | POA: Diagnosis not present

## 2014-05-11 DIAGNOSIS — Z8719 Personal history of other diseases of the digestive system: Secondary | ICD-10-CM | POA: Diagnosis not present

## 2014-05-11 DIAGNOSIS — K429 Umbilical hernia without obstruction or gangrene: Secondary | ICD-10-CM | POA: Diagnosis not present

## 2014-05-11 DIAGNOSIS — D259 Leiomyoma of uterus, unspecified: Secondary | ICD-10-CM

## 2014-05-11 DIAGNOSIS — M1990 Unspecified osteoarthritis, unspecified site: Secondary | ICD-10-CM | POA: Insufficient documentation

## 2014-05-11 DIAGNOSIS — Z79899 Other long term (current) drug therapy: Secondary | ICD-10-CM | POA: Diagnosis not present

## 2014-05-11 DIAGNOSIS — Z9889 Other specified postprocedural states: Secondary | ICD-10-CM | POA: Diagnosis not present

## 2014-05-11 DIAGNOSIS — E785 Hyperlipidemia, unspecified: Secondary | ICD-10-CM | POA: Diagnosis not present

## 2014-05-11 DIAGNOSIS — F419 Anxiety disorder, unspecified: Secondary | ICD-10-CM | POA: Diagnosis not present

## 2014-05-11 DIAGNOSIS — Z7982 Long term (current) use of aspirin: Secondary | ICD-10-CM | POA: Diagnosis not present

## 2014-05-11 LAB — CBC WITH DIFFERENTIAL/PLATELET
BASOS ABS: 0 10*3/uL (ref 0.0–0.1)
BASOS PCT: 0 % (ref 0–1)
Band Neutrophils: 0 % (ref 0–10)
Blasts: 0 %
Eosinophils Absolute: 0.1 10*3/uL (ref 0.0–0.7)
Eosinophils Relative: 1 % (ref 0–5)
HEMATOCRIT: 37.6 % (ref 36.0–46.0)
HEMOGLOBIN: 12.8 g/dL (ref 12.0–15.0)
Lymphocytes Relative: 34 % (ref 12–46)
Lymphs Abs: 3.9 10*3/uL (ref 0.7–4.0)
MCH: 29.7 pg (ref 26.0–34.0)
MCHC: 34 g/dL (ref 30.0–36.0)
MCV: 87.2 fL (ref 78.0–100.0)
METAMYELOCYTES PCT: 0 %
MONO ABS: 0.3 10*3/uL (ref 0.1–1.0)
MONOS PCT: 3 % (ref 3–12)
MYELOCYTES: 0 %
Neutro Abs: 7.3 10*3/uL (ref 1.7–7.7)
Neutrophils Relative %: 62 % (ref 43–77)
Platelets: 287 10*3/uL (ref 150–400)
Promyelocytes Absolute: 0 %
RBC: 4.31 MIL/uL (ref 3.87–5.11)
RDW: 14.6 % (ref 11.5–15.5)
WBC: 11.6 10*3/uL — AB (ref 4.0–10.5)
nRBC: 0 /100 WBC

## 2014-05-11 LAB — BASIC METABOLIC PANEL
ANION GAP: 11 (ref 5–15)
BUN: 14 mg/dL (ref 6–23)
CHLORIDE: 101 meq/L (ref 96–112)
CO2: 28 mEq/L (ref 19–32)
CREATININE: 0.89 mg/dL (ref 0.50–1.10)
Calcium: 9.6 mg/dL (ref 8.4–10.5)
GFR calc non Af Amer: 67 mL/min — ABNORMAL LOW (ref >90)
GFR, EST AFRICAN AMERICAN: 78 mL/min — AB (ref >90)
Glucose, Bld: 101 mg/dL — ABNORMAL HIGH (ref 70–99)
POTASSIUM: 4.3 meq/L (ref 3.7–5.3)
Sodium: 140 mEq/L (ref 137–147)

## 2014-05-11 MED ORDER — IOHEXOL 300 MG/ML  SOLN
100.0000 mL | Freq: Once | INTRAMUSCULAR | Status: AC | PRN
  Administered 2014-05-11: 100 mL via INTRAVENOUS

## 2014-05-11 MED ORDER — MEGESTROL ACETATE 40 MG PO TABS
40.0000 mg | ORAL_TABLET | Freq: Once | ORAL | Status: AC
  Administered 2014-05-12: 40 mg via ORAL
  Filled 2014-05-11: qty 1

## 2014-05-11 MED ORDER — MEGESTROL ACETATE 40 MG PO TABS
ORAL_TABLET | ORAL | Status: AC
  Filled 2014-05-11: qty 1

## 2014-05-11 MED ORDER — MEGESTROL ACETATE 20 MG PO TABS: ORAL_TABLET | ORAL | Status: DC

## 2014-05-11 MED ORDER — IOHEXOL 300 MG/ML  SOLN
50.0000 mL | Freq: Once | INTRAMUSCULAR | Status: AC | PRN
  Administered 2014-05-11: 50 mL via ORAL

## 2014-05-11 NOTE — ED Provider Notes (Signed)
CSN: [id number]     Arrival date & time 05/11/14  2015 History   First MD Initiated Contact with Patient 05/11/14 2047     Chief Complaint  Patient presents with  . Vaginal Bleeding     (Consider location/radiation/quality/duration/timing/severity/associated sxs/prior Treatment) Patient is a 63 y.o. female presenting with vaginal bleeding. The history is provided by the patient.  Vaginal Bleeding Quality:  Bright red Severity:  Moderate Onset quality:  Gradual Duration:  4 days Timing:  Constant Progression:  Worsening Chronicity:  New Menstrual history:  Postmenopausal Possible pregnancy: no   Context: spontaneously   Relieved by:  None tried Worsened by:  Nothing tried Ineffective treatments:  None tried Associated symptoms: abdominal pain    [name] is a 63 y.o. female who presents to the ED with vaginal bleeding that started 4 days ago. She states the bleeding is heavier than a period. She had a similar episode about a month ago but other than that has not had a period in 15 years. She did have a pap smear with her PCP a few months ago that was normal. She has felt a little tired but denies fever, chills, nausea, vomiting or other problems. She does report abdominal pain.   Past Medical History  Diagnosis Date  . Obesity hypoventilation syndrome   . Obstructive sleep apnea   . Osteoarthritis   . Low back pain   . HTN (hypertension)   . Hyperlipidemia   . GERD (gastroesophageal reflux disease)   . Depression   . COPD (chronic obstructive pulmonary disease)   . Anxiety   . Gout   . Achilles tendinitis    Past Surgical History  Procedure Laterality Date  . Tubal ligation    . Cardiac catheterization  2006  . Colonoscopy N/A 12/25/2013    Procedure: COLONOSCOPY;  Surgeon: Rogene Houston, MD;  Location: [facility];  Service: Endoscopy;  Laterality: N/A;  730-rescheduled Ann notified pt   Family History  Problem Relation Age of Onset  . Heart disease  Mother   . Thyroid disease Mother   . Heart failure Father   . Diabetes Brother   . Crohn's disease Daughter    History  Substance Use Topics  . Smoking status: Former Smoker -- 1.50 packs/day for 30 years    Types: Cigarettes    Quit date: 03/25/2005  . Smokeless tobacco: Never Used  . Alcohol Use: No   OB History   Grav Para Term Preterm Abortions TAB SAB Ect Mult Living   3 3 3       3      Review of Systems  Gastrointestinal: Positive for abdominal pain.  Genitourinary: Positive for vaginal bleeding.  all other systems negative    Allergies  Review of patient's allergies indicates no known allergies.  Home Medications   Prior to Admission medications   Medication Sig Start Date End Date Taking? Authorizing Provider  allopurinol (ZYLOPRIM) 100 MG tablet Take 1 tablet (100 mg total) by mouth daily. 04/09/14   Alycia Rossetti, MD  aspirin EC 325 MG tablet Take 325 mg by mouth daily.    Historical Provider, MD  clotrimazole-betamethasone (LOTRISONE) cream Apply 1 application topically 2 (two) times daily as needed. To be applied to the feet as needed 02/26/14   Alycia Rossetti, MD  fluticasone (CUTIVATE) 0.05 % cream Apply 1 application topically daily. 11/26/13   Alycia Rossetti, MD  furosemide (LASIX) 40 MG tablet Take 1 tablet (40 mg  total) by mouth daily. 04/09/14   Alycia Rossetti, MD  HYDROcodone-acetaminophen (NORCO) 7.5-325 MG per tablet Take 1 tablet by mouth every 6 (six) hours as needed for moderate pain. 04/22/14   Alycia Rossetti, MD  losartan (COZAAR) 50 MG tablet Take 1 tablet (50 mg total) by mouth daily. 01/23/14   Susy Frizzle, MD  naproxen (NAPROSYN) 500 MG tablet Take 1 tablet (500 mg total) by mouth 2 (two) times daily. 04/09/14   Alycia Rossetti, MD  NON FORMULARY at bedtime. CPAP and 1 lpm oxygen qhs    Historical Provider, MD  tiotropium (SPIRIVA HANDIHALER) 18 MCG inhalation capsule Place 1 capsule (18 mcg total) into inhaler and inhale daily. 04/02/14    Elsie Stain, MD  tiZANidine (ZANAFLEX) 4 MG tablet Take 1 tablet (4 mg total) by mouth 2 (two) times daily as needed for muscle spasms. 04/23/14   Alycia Rossetti, MD   BP 134/65  Pulse 80  Temp(Src) 98.2 F (36.8 C) (Oral)  Resp 16  Ht 5\' 6"  (1.676 m)  Wt 275 lb (124.739 kg)  BMI 44.41 kg/m2  SpO2 96% Physical Exam  Nursing note and vitals reviewed. Constitutional: She is oriented to person, place, and time. She appears well-developed and well-nourished. No distress.  HENT:  Head: Normocephalic and atraumatic.  Eyes: Conjunctivae and EOM are normal.  Neck: Neck supple.  Cardiovascular: Normal rate.   Pulmonary/Chest: Effort normal.  Abdominal: Soft. Bowel sounds are normal. There is tenderness in the right lower quadrant and left lower quadrant. There is no CVA tenderness. A hernia is present.  Umbilical hernia  Genitourinary:  External genitalia without lesions, moderate blood vaginal vault. Cervix without lesions. Tender on exam. Uterus enlarged and irregular.    Musculoskeletal: Normal range of motion.  Neurological: She is alert and oriented to person, place, and time. No cranial nerve deficit.  Skin: Skin is warm and dry.  Psychiatric: She has a normal mood and affect. Her behavior is normal.    ED Course  Procedures (including critical care time) Labs Review Results for orders placed during the hospital encounter of 05/11/14 (from the past 24 hour(s))  CBC WITH DIFFERENTIAL     Status: Abnormal   Collection Time    05/11/14  9:01 PM      Result Value Ref Range   WBC 11.6 (*) 4.0 - 10.5 K/uL   RBC 4.31  3.87 - 5.11 MIL/uL   Hemoglobin 12.8  12.0 - 15.0 g/dL   HCT 37.6  36.0 - 46.0 %   MCV 87.2  78.0 - 100.0 fL   MCH 29.7  26.0 - 34.0 pg   MCHC 34.0  30.0 - 36.0 g/dL   RDW 14.6  11.5 - 15.5 %   Platelets 287  150 - 400 K/uL   Neutrophils Relative % 62  43 - 77 %   Lymphocytes Relative 34  12 - 46 %   Monocytes Relative 3  3 - 12 %   Eosinophils Relative 1   0 - 5 %   Basophils Relative 0  0 - 1 %   Band Neutrophils 0  0 - 10 %   Metamyelocytes Relative 0     Myelocytes 0     Promyelocytes Absolute 0     Blasts 0     nRBC 0  0 /100 WBC   Neutro Abs 7.3  1.7 - 7.7 K/uL   Lymphs Abs 3.9  0.7 - 4.0 K/uL  Monocytes Absolute 0.3  0.1 - 1.0 K/uL   Eosinophils Absolute 0.1  0.0 - 0.7 K/uL   Basophils Absolute 0.0  0.0 - 0.1 K/uL  BASIC METABOLIC PANEL     Status: Abnormal   Collection Time    05/11/14  9:01 PM      Result Value Ref Range   Sodium 140  137 - 147 mEq/L   Potassium 4.3  3.7 - 5.3 mEq/L   Chloride 101  96 - 112 mEq/L   CO2 28  19 - 32 mEq/L   Glucose, Bld 101 (*) 70 - 99 mg/dL   BUN 14  6 - 23 mg/dL   Creatinine, Ser 0.89  0.50 - 1.10 mg/dL   Calcium 9.6  8.4 - 10.5 mg/dL   GFR calc non Af Amer 67 (*) >90 mL/min   GFR calc Af Amer 78 (*) >90 mL/min   Anion gap 11  5 - 15    Ct Abdomen Pelvis W Contrast  05/11/2014   CLINICAL DATA:  Large amount of vaginal bleeding for 4 days. Similar episode a month ago. No pain.  EXAM: CT ABDOMEN AND PELVIS WITH CONTRAST  TECHNIQUE: Multidetector CT imaging of the abdomen and pelvis was performed using the standard protocol following bolus administration of intravenous contrast.  CONTRAST:  38mL OMNIPAQUE IOHEXOL 300 MG/ML SOLN, 159mL OMNIPAQUE IOHEXOL 300 MG/ML SOLN  COMPARISON:  None.  FINDINGS: Minimal atelectasis in the lung bases. Cystic lesion in the subcutaneous fat over the lower chest likely represent a sebaceous cyst.  Diffuse fatty infiltration of the liver. The gallbladder, spleen, pancreas, adrenal glands, kidneys, abdominal aorta, inferior vena cava, and retroperitoneal lymph nodes are unremarkable. Mild prominence of gonadal veins bilaterally. Stomach, small bowel, and colon are unremarkable for degree of distention. Contrast material flows through to the colon without evidence of obstruction. No free air or free fluid in the abdomen. Moderate-sized umbilical hernia containing fat.   Pelvis: Enlarged nodular appearance of the uterus with calcification consistent with fibroids. The endometrium appears somewhat prominent and may be fluid-filled. Consider ultrasound for further evaluation. No abnormal adnexal masses or pelvic lymphadenopathy. Appendix is normal. Rectosigmoid colon is decompressed. No evidence of diverticulitis. No free or loculated pelvic fluid collections. Degenerative changes in the lumbar spine. No destructive bone lesions.  IMPRESSION: Diffuse fatty infiltration of the liver. Moderate-sized umbilical hernia containing fat. No bowel obstruction. Fibroid uterus. Endometrium appears prominent and may be fluid filled. Consider ultrasound for evaluation.   Electronically Signed   By: Lucienne Capers M.D.   On: 05/11/2014 23:19   Consult with Dr. Ihor Dow. Will start patient on Megace and have her follow up with GYN.  MDM  63 y.o. female with abnormal vaginal bleeding x 4 days and abdominal cramping. Will start on Megace and she will call Dr. Johnnye Sima office for a follow up appointment. She will return for worsening symptoms. Stable for discharge and is not orthostatic or anemic. I have reviewed this patient's vital signs, nurses notes, appropriate labs and imaging.  I have discussed findings and plan of care with the patient and she voices understanding and agrees with the plan.    Medication List    TAKE these medications       megestrol 20 MG tablet  Commonly known as:  MEGACE  - Take two tablets (40 mg) twice a day for three days,   - then take two tablets (40 mg) once a day      ASK your doctor about  these medications       allopurinol 100 MG tablet  Commonly known as:  ZYLOPRIM  Take 1 tablet (100 mg total) by mouth daily.     aspirin EC 325 MG tablet  Take 325 mg by mouth daily.     clotrimazole-betamethasone cream  Commonly known as:  LOTRISONE  Apply 1 application topically 2 (two) times daily as needed. To be applied to the feet as  needed     fluticasone 0.05 % cream  Commonly known as:  CUTIVATE  Apply 1 application topically daily.     furosemide 40 MG tablet  Commonly known as:  LASIX  Take 1 tablet (40 mg total) by mouth daily.     HYDROcodone-acetaminophen 7.5-325 MG per tablet  Commonly known as:  NORCO  Take 1 tablet by mouth every 6 (six) hours as needed for moderate pain.     losartan 50 MG tablet  Commonly known as:  COZAAR  Take 1 tablet (50 mg total) by mouth daily.     naproxen 500 MG tablet  Commonly known as:  NAPROSYN  Take 1 tablet (500 mg total) by mouth 2 (two) times daily.     NON FORMULARY  at bedtime. CPAP and 1 lpm oxygen qhs     tiotropium 18 MCG inhalation capsule  Commonly known as:  SPIRIVA HANDIHALER  Place 1 capsule (18 mcg total) into inhaler and inhale daily.     tiZANidine 4 MG tablet  Commonly known as:  ZANAFLEX  Take 1 tablet (4 mg total) by mouth 2 (two) times daily as needed for muscle spasms.          [facility] Bunnie Pion, [place] 05/12/14 [phone]

## 2014-05-11 NOTE — ED Notes (Signed)
Pt reports bright red vaginal bleeding that started last Thursday, states is passing some clots as well, denies pain

## 2014-05-11 NOTE — Discharge Instructions (Signed)
Call Dr. Johnnye Sima office tomorrow for a follow up appointment.   Abnormal Uterine Bleeding Abnormal uterine bleeding can affect women at various stages in life, including teenagers, women in their reproductive years, pregnant women, and women who have reached menopause. Several kinds of uterine bleeding are considered abnormal, including:  Bleeding or spotting between periods.   Bleeding after sexual intercourse.   Bleeding that is heavier or more than normal.   Periods that last longer than usual.  Bleeding after menopause.  Many cases of abnormal uterine bleeding are minor and simple to treat, while others are more serious. Any type of abnormal bleeding should be evaluated by your health care provider. Treatment will depend on the cause of the bleeding. HOME CARE INSTRUCTIONS Monitor your condition for any changes. The following actions may help to alleviate any discomfort you are experiencing:  Avoid the use of tampons and douches as directed by your health care provider.  Change your pads frequently. You should get regular pelvic exams and Pap tests. Keep all follow-up appointments for diagnostic tests as directed by your health care provider.  SEEK MEDICAL CARE IF:   Your bleeding lasts more than 1 week.   You feel dizzy at times.  SEEK IMMEDIATE MEDICAL CARE IF:   You pass out.   You are changing pads every 15 to 30 minutes.   You have abdominal pain.  You have a fever.   You become sweaty or weak.   You are passing large blood clots from the vagina.   You start to feel nauseous and vomit. MAKE SURE YOU:   Understand these instructions.  Will watch your condition.  Will get help right away if you are not doing well or get worse. Document Released: 07/11/2005 Document Revised: 07/16/2013 Document Reviewed: 02/07/2013 [facility] Patient Information 2015 [place], [place]. This information is not intended to replace advice given to you by your health care  provider. Make sure you discuss any questions you have with your health care provider.  Fibroids Fibroids are lumps (tumors) that can occur any place in a woman's body. These lumps are not cancerous. Fibroids vary in size, weight, and where they grow. HOME CARE  Do not take aspirin.  Write down the number of pads or tampons you use during your period. Tell your doctor. This can help determine the best treatment for you. GET HELP RIGHT AWAY IF:  You have pain in your lower belly (abdomen) that is not helped with medicine.  You have cramps that are not helped with medicine.  You have more bleeding between or during your period.  You feel lightheaded or pass out (faint).  Your lower belly pain gets worse. MAKE SURE YOU:  Understand these instructions.  Will watch your condition.  Will get help right away if you are not doing well or get worse. Document Released: 08/13/2010 Document Revised: 10/03/2011 Document Reviewed: 08/13/2010 [facility] Patient Information 2015 [place], [place]. This information is not intended to replace advice given to you by your health care provider. Make sure you discuss any questions you have with your health care provider.

## 2014-05-12 ENCOUNTER — Ambulatory Visit (HOSPITAL_COMMUNITY)
Admission: RE | Admit: 2014-05-12 | Discharge: 2014-05-12 | Disposition: A | Discharge status: Home / Self Care | Payer: Medicare HMO | Source: Ambulatory Visit | Attending: Critical Care Medicine | Admitting: Critical Care Medicine

## 2014-05-12 ENCOUNTER — Telehealth: Payer: Self-pay | Admitting: *Deleted

## 2014-05-12 DIAGNOSIS — J432 Centrilobular emphysema: Secondary | ICD-10-CM | POA: Insufficient documentation

## 2014-05-12 LAB — BLOOD GAS, ARTERIAL
ACID-BASE EXCESS: 2.4 mmol/L — AB (ref 0.0–2.0)
Bicarbonate: 26.7 mEq/L — ABNORMAL HIGH (ref 20.0–24.0)
Drawn by: 25788
O2 SAT: 92.2 %
PCO2 ART: 43.7 mmHg (ref 35.0–45.0)
PO2 ART: 66.8 mmHg — AB (ref 80.0–100.0)
Patient temperature: 37
TCO2: 23.3 mmol/L (ref 0–100)
pH, Arterial: 7.404 (ref 7.350–7.450)

## 2014-05-12 NOTE — ED Provider Notes (Signed)
Medical screening examination/treatment/procedure(s) were conducted as a shared visit with non-physician practitioner(s) or resident  and myself.  I personally evaluated the patient during the encounter and agree with the findings.   I have personally reviewed any xrays and/ or EKG's with the provider and I agree with interpretation.     Patient presents to ED with vaginal bleeding for past 4 days. Reports similar prior episode a month ago for 2 days. Denies pain, syncope. No history of hysterectomy, no blood thinners. No fevers or chills. Abdomen soft, no focal tenderness, no guarding, patient well-appearing.  Discussed screening blood work, CT abdomen pelvis to look for signs of new vaginal bleeding and close outpatient followup with obstetrics and gynecology.  Vaginal bleeding, Fibroids   Mariea Clonts, MD 05/12/14 [phone]

## 2014-05-12 NOTE — Telephone Encounter (Signed)
Pt called stating that pt was in ED over weekend and they stated that she needed to see her OB/GYN for follow up for vaginal bleeding, orginally pt had appt already set up for Wednesday oct 21 at [facility], pts husband has requested her to be seen sooner than Wednesday, I called [facility] to see if can get patient a sooner appointment than wedneday. Appointment has been made for Tuesday Oct 21 at 3p with Dr. Elonda Husky. Pt has been notified and agrees with this appt.

## 2014-05-13 ENCOUNTER — Encounter: Payer: Self-pay | Admitting: Obstetrics & Gynecology

## 2014-05-13 ENCOUNTER — Ambulatory Visit: Payer: Self-pay | Admitting: Family Medicine

## 2014-05-13 ENCOUNTER — Ambulatory Visit (INDEPENDENT_AMBULATORY_CARE_PROVIDER_SITE_OTHER): Payer: Medicare HMO

## 2014-05-13 ENCOUNTER — Ambulatory Visit (INDEPENDENT_AMBULATORY_CARE_PROVIDER_SITE_OTHER): Payer: Medicare HMO | Admitting: Obstetrics & Gynecology

## 2014-05-13 ENCOUNTER — Other Ambulatory Visit: Payer: Self-pay | Admitting: Obstetrics & Gynecology

## 2014-05-13 VITALS — BP 122/80 | Ht 66.0 in | Wt 274.0 lb

## 2014-05-13 DIAGNOSIS — R938 Abnormal findings on diagnostic imaging of other specified body structures: Secondary | ICD-10-CM

## 2014-05-13 DIAGNOSIS — N950 Postmenopausal bleeding: Secondary | ICD-10-CM

## 2014-05-13 DIAGNOSIS — R9389 Abnormal findings on diagnostic imaging of other specified body structures: Secondary | ICD-10-CM

## 2014-05-13 NOTE — Progress Notes (Signed)
Patient ID: [name], female   DOB: [birth year], 63 y.o.   MRN: [id number] Pt went in to ER with post menopausal bleeding was placed on megestrol at my request Had CT which revealed a thickened endometrium Here today for a endometrial sampling biopsy  Endometrial Biopsy Procedure Note  Pre-operative Diagnosis: Post menopausal bleeding and thickened endometrium  Post-operative Diagnosis: same  Indications: postmenopausal bleeding and thickened endometrium  Procedure Details   Urine pregnancy test was not done.  The risks (including infection, bleeding, pain, and uterine perforation) and benefits of the procedure were explained to the patient and Written informed consent was obtained.  Antibiotic prophylaxis against endocarditis was not indicated.   The patient was placed in the dorsal lithotomy position.  Bimanual exam showed the uterus to be in the neutral position.  A Graves' speculum inserted in the vagina, and the cervix prepped with povidone iodine.  Endocervical curettage with a Kevorkian curette was not performed.   A sharp tenaculum was applied to the anterior lip of the cervix for stabilization.  A sterile uterine sound was used to sound the uterus to a depth of 6cm.  A Pipelle endometrial aspirator was used to sample the endometrium.  Sample was sent for pathologic examination.  Condition: Stable  Complications: None  Plan:  The patient was advised to call for any fever or for prolonged or severe pain or bleeding. She was advised to use OTC ibuprofen as needed for mild to moderate pain. She was advised to avoid vaginal intercourse for 48 hours or until the bleeding has completely stopped.  Attending Physician Documentation: I was present for or participated in the entire procedure, including opening and closing.  We will see her back in 1 week to go over her results

## 2014-05-14 ENCOUNTER — Ambulatory Visit: Payer: Medicare HMO | Admitting: Obstetrics and Gynecology

## 2014-05-19 ENCOUNTER — Telehealth: Payer: Self-pay | Admitting: Critical Care Medicine

## 2014-05-19 DIAGNOSIS — J449 Chronic obstructive pulmonary disease, unspecified: Secondary | ICD-10-CM

## 2014-05-19 NOTE — Telephone Encounter (Signed)
Order faxed to [facility] to repeat ONO on cpap and oxygen at 2 liters. Rhonda J Cobb

## 2014-05-19 NOTE — Telephone Encounter (Signed)
Please put in order for this thanks libby

## 2014-05-19 NOTE — Telephone Encounter (Signed)
Called, spoke with pt.  Informed her of below results and recs per Dr. Joya Gaskins.  She verbalized understanding and is aware to use 2 lpm o2 and cpap during repeat ONO.  She voiced no further questions or concerns at this time and is to call back if anything further is needed.  PCCs, order is in.  Thank you.

## 2014-05-19 NOTE — Telephone Encounter (Signed)
RA ONO abnormal sats low 70% range Pt needs to stay on oxygen but need to repeat ono on 2L cpap

## 2014-05-20 ENCOUNTER — Ambulatory Visit: Payer: Medicare HMO | Admitting: Obstetrics & Gynecology

## 2014-05-20 ENCOUNTER — Other Ambulatory Visit: Payer: Self-pay | Admitting: Obstetrics & Gynecology

## 2014-05-20 MED ORDER — MEGESTROL ACETATE 40 MG PO TABS: 40.0000 mg | ORAL_TABLET | Freq: Every day | ORAL | Status: DC

## 2014-05-22 ENCOUNTER — Telehealth: Payer: Self-pay | Admitting: Family Medicine

## 2014-05-22 NOTE — Telephone Encounter (Signed)
Okay to refill? 

## 2014-05-22 NOTE — Telephone Encounter (Signed)
[phone]  Pt is needing a refill on HYDROcodone-acetaminophen (NORCO) 7.5-325 MG per tablet

## 2014-05-22 NOTE — Telephone Encounter (Signed)
Ok to refill??  Last office visit 04/23/2014.  Last refill 04/22/2014.

## 2014-05-23 MED ORDER — HYDROCODONE-ACETAMINOPHEN 7.5-325 MG PO TABS: 1.0000 | ORAL_TABLET | Freq: Four times a day (QID) | ORAL | Status: DC | PRN

## 2014-05-23 NOTE — Telephone Encounter (Signed)
Rx printed and pt aware

## 2014-05-26 ENCOUNTER — Encounter: Payer: Self-pay | Admitting: Obstetrics & Gynecology

## 2014-06-02 ENCOUNTER — Ambulatory Visit (INDEPENDENT_AMBULATORY_CARE_PROVIDER_SITE_OTHER): Payer: Medicare HMO | Admitting: Family Medicine

## 2014-06-02 ENCOUNTER — Encounter: Payer: Self-pay | Admitting: Family Medicine

## 2014-06-02 VITALS — BP 126/70 | HR 72 | Temp 97.2°F | Resp 16 | Ht 66.0 in | Wt 270.0 lb

## 2014-06-02 DIAGNOSIS — J432 Centrilobular emphysema: Secondary | ICD-10-CM

## 2014-06-02 DIAGNOSIS — N950 Postmenopausal bleeding: Secondary | ICD-10-CM | POA: Insufficient documentation

## 2014-06-02 DIAGNOSIS — I10 Essential (primary) hypertension: Secondary | ICD-10-CM

## 2014-06-02 LAB — CBC
HCT: 35.3 % — ABNORMAL LOW (ref 36.0–46.0)
Hemoglobin: 11.4 g/dL — ABNORMAL LOW (ref 12.0–15.0)
MCH: 28.4 pg (ref 26.0–34.0)
MCHC: 32.3 g/dL (ref 30.0–36.0)
MCV: 88 fL (ref 78.0–100.0)
Platelets: 340 10*3/uL (ref 150–400)
RBC: 4.01 MIL/uL (ref 3.87–5.11)
RDW: 15.1 % (ref 11.5–15.5)
WBC: 8.8 10*3/uL (ref 4.0–10.5)

## 2014-06-02 NOTE — Progress Notes (Signed)
Patient ID: [name], female   DOB: 1951-05-15, 63 y.o.   MRN: [id number]   Subjective:    Patient ID: [name], female    DOB: Dec 16, 1950, 63 y.o.   MRN: [id number]  Patient presents for 3 month F/U and Hosopital F/U patient here to follow-up hospital visit. She was in the ER secondary to postmenopausal bleeding she went to the ER today coming in for an office visit. She was started on Megace she was seen by GYN ultrasound was done which showed some small fibroids as well as a very thickened endometrium she had endometrial biopsy but I did not have the results for this. She was supposed to follow-up with GYN however she cannot afford the $40 co-pay therefore has not. She is still taking Megace not sure she is on 20 mg of the 40 mg. She denies any significant cramping with the bleeding she does take her muscle relaxer which helps. She was also seen by pulmonary and she had a home oxygen study done which shows that she does need her CPAP   Review Of Systems:  GEN- denies fatigue, fever, weight loss,weakness, recent illness HEENT- denies eye drainage, change in vision, nasal discharge, CVS- denies chest pain, palpitations RESP- denies SOB, cough, wheeze ABD- denies N/V, change in stools, abd pain GU- denies dysuria, hematuria, dribbling, incontinence MSK- + joint pain, muscle aches, injury Neuro- denies headache, dizziness, syncope, seizure activity       Objective:    BP 126/70 mmHg  Pulse 72  Temp(Src) 97.2 F (36.2 C) (Oral)  Resp 16  Ht 5\' 6"  (1.676 m)  Wt 270 lb (122.471 kg)  BMI 43.60 kg/m2 GEN- NAD, alert and oriented x3 HEENT- PERRL, EOMI, non injected sclera, pink conjunctiva, MMM, oropharynx clear CVS- RRR, no murmur RESP-occasional wheeze, otherwise clear EXT- No edema Pulses- Radial 2+        Assessment & Plan:      Problem List Items Addressed This Visit    None    Visit Diagnoses    Post-menopausal bleeding    -  Primary    Relevant  Orders       CBC       Note: This dictation was prepared with Dragon dictation along with smaller phrase technology. Any transcriptional errors that result from this process are unintentional.

## 2014-06-02 NOTE — Assessment & Plan Note (Signed)
Followed by pulmonary. She is using Spiriva taken for a sample of this in the office today she has some difficulty keeping her inhaler secondary to the cost Her flu shot is up-to-date

## 2014-06-02 NOTE — Assessment & Plan Note (Signed)
Postmenopausal bleeding noted. She was seen by GYN however unable to afford continue seeing them. I will follow with her office regarding the results of her endometrial biopsy and see what the next step is. For now she will continue on the Megace 40 mg once a day this is starting to slow her bleeding and she is now down to mostly spotting. I will check a CBC today

## 2014-06-02 NOTE — Assessment & Plan Note (Signed)
Her blood pressure looks goodoff of medication she is on Lasix for leg swelling

## 2014-06-02 NOTE — Patient Instructions (Signed)
Continue the megace 40mg  once a day  We will call with pathology results from your biopsy We will call with lab results F/U 3 months

## 2014-06-05 ENCOUNTER — Telehealth: Payer: Self-pay | Admitting: Critical Care Medicine

## 2014-06-05 DIAGNOSIS — J439 Emphysema, unspecified: Secondary | ICD-10-CM

## 2014-06-05 NOTE — Telephone Encounter (Signed)
Received ONO results from 05/26/2014. Pt had ONO on 2lpm with cpap. Per Dr. Joya Gaskins, pt needs to increase o2 to 4lpm with cpap.   He would like to repeat ONO on the 4lpm AND cpap.  Called, spoke with pt.  Discussed ONO results and recs per Dr. Joya Gaskins.  She verbalized understanding and is in agreement with plan.  Pt aware order placed for repeat ONO and will receive a call from DME to schedule this.  Pt is to call office if she doesn't hear from DME in the next 2-3 days or if she has any further questions or concerns.

## 2014-06-09 ENCOUNTER — Telehealth: Payer: Self-pay | Admitting: *Deleted

## 2014-06-09 ENCOUNTER — Telehealth: Payer: Self-pay | Admitting: Critical Care Medicine

## 2014-06-09 NOTE — Telephone Encounter (Signed)
Spoke with the pt  She is calling to check the status of ONO with [facility]  I see order, but can not tell if anything has been done with it Will forward to [facility] to take care of this Thanks

## 2014-06-10 NOTE — Telephone Encounter (Signed)
Spoke to [email or web address]  she is going to call pt today, pt is aware Joellen Jersey

## 2014-06-10 NOTE — Telephone Encounter (Signed)
i signed them off from my in basket already, it isn't in my in basket still, like a couple of weeks ago, they were normal but with proliferative changes meaning she needs to remain of the progesterone and see me back in 6 months  Probably need to callo pathology and have them put them in or send so we can scan in

## 2014-06-10 NOTE — Telephone Encounter (Addendum)
Pt informed of WNL results from endometrial biopsy from 05/14/2014. Pt to f/u with Dr. Elonda Husky in 6 mos. Pt verbalized understanding.

## 2014-06-11 ENCOUNTER — Telehealth: Payer: Self-pay | Admitting: Family Medicine

## 2014-06-11 NOTE — Telephone Encounter (Signed)
Call placed to patient.   Reports that she has noted small amount of brownish red colored blood to stools.   Denies weakness or pain.   States that she only sees blood in the morning, and it resolves during the day.   MD made aware and advised to schedule for OV.  Appointment scheduled.

## 2014-06-11 NOTE — Telephone Encounter (Signed)
[phone] Patient is calling to talk about blood still being in her stool

## 2014-06-13 ENCOUNTER — Telehealth: Payer: Self-pay | Admitting: Family Medicine

## 2014-06-13 NOTE — Telephone Encounter (Signed)
Call pt her biopsy results were benign, I spoke with GYN She needs to continue the Megace 40mg  once a day for the next 3 months then we will try to lower her dose

## 2014-06-13 NOTE — Telephone Encounter (Signed)
-----   Message from Florian Buff, MD sent at 06/12/2014 10:46 AM EST ----- Regarding: RE: Post menopausal bleeding  HEY TC  Yeah I don't know exactly why it did not get digitally placed, but in any event i have a note which reflects that it returned as benign but with proliferative changes which will require on ggoing suppression with progesterone agents.  I would do megace for 3-6 months then try to switch over to medroxyprogesterone 10 mg daily thereafter to see if that is enough to keep her from bleeding.  Sonogram needs to be repeated in a year to evaluate the endometrial stripe.  Thanks  Lurena Joiner ----- Message -----    From: Alycia Rossetti, MD    Sent: 06/02/2014  11:14 AM      To: Alycia Rossetti, MD, Florian Buff, MD Subject: Post menopausal bleeding                        Hello,   You saw Blinda after and ER visit for post menopausal bleeding, [place] and endometrial biopsy was done? I dont see the results of the biopsy, however she is unable to afford to continue seeing you. Is there anything I can do to assist you with her.She is still on Megace 40mg  once a day.   -- [facility]

## 2014-06-13 NOTE — Telephone Encounter (Signed)
Call placed to patient. No answer. No VM.  

## 2014-06-16 ENCOUNTER — Other Ambulatory Visit: Payer: Self-pay | Admitting: Family Medicine

## 2014-06-16 NOTE — Telephone Encounter (Signed)
Continue taking the hormone at 40mg  of megace, keep the appt

## 2014-06-16 NOTE — Telephone Encounter (Signed)
Call placed to patient and patient made aware.   States that she continues to have slight spotting. Reports that she thought she was having some rectal bleeding last week and scheduled an appointment to see MD.   States that upon further investigation, the spotting was vaginal.   Requested MD to advise.

## 2014-06-17 ENCOUNTER — Encounter: Payer: Self-pay | Admitting: Obstetrics & Gynecology

## 2014-06-17 NOTE — Telephone Encounter (Signed)
Refill appropriate and filled per protocol. 

## 2014-06-17 NOTE — Telephone Encounter (Signed)
Call placed to patient and patient made aware.  

## 2014-06-18 ENCOUNTER — Other Ambulatory Visit: Payer: Self-pay | Admitting: Family Medicine

## 2014-06-18 ENCOUNTER — Ambulatory Visit (INDEPENDENT_AMBULATORY_CARE_PROVIDER_SITE_OTHER): Payer: Medicare HMO | Admitting: Family Medicine

## 2014-06-18 ENCOUNTER — Encounter: Payer: Self-pay | Admitting: Family Medicine

## 2014-06-18 VITALS — BP 126/74 | HR 72 | Temp 98.4°F | Resp 16 | Ht 66.0 in | Wt 270.0 lb

## 2014-06-18 DIAGNOSIS — N950 Postmenopausal bleeding: Secondary | ICD-10-CM

## 2014-06-18 LAB — WET PREP FOR TRICH, YEAST, CLUE
Trich, Wet Prep: NONE SEEN
Yeast Wet Prep HPF POC: NONE SEEN

## 2014-06-18 MED ORDER — HYDROCODONE-ACETAMINOPHEN 7.5-325 MG PO TABS: 1.0000 | ORAL_TABLET | Freq: Four times a day (QID) | ORAL | Status: DC | PRN

## 2014-06-18 MED ORDER — METRONIDAZOLE 500 MG PO TABS: 500.0000 mg | ORAL_TABLET | Freq: Two times a day (BID) | ORAL | Status: DC

## 2014-06-18 NOTE — Progress Notes (Signed)
Patient ID: [name], female   DOB: 1950-12-15, 63 y.o.   MRN: [id number]   Subjective:    Patient ID: [name], female    DOB: 1950-10-21, 63 y.o.   MRN: [id number]  Patient presents for F/U Bleeding  Patient here with bleeding. She has had recent vaginal bleeding seen by GYN she had thickened endometrium Biopsy done which showed benign findings. She's been placed on Megace 40 mg which she states the bleeding has decreased since starting the medication. She'll also noted to have 2 fibroids. She called last week finished thought she was having rectal bleeding and severe vaginal bleeding she felt a knot come up on her right side and in her left that went away and then she noticed some bleeding in her underpants that she thought it may of been rectal. She does not have any history of any hemorrhoidal pain. Denies any abdominal pain currently.  She also asked for samples of Spiriva  we only have Spiriva respimat therefore this was given to her with instructions to puffs once a day   Review Of Systems:  GEN- denies fatigue, fever, weight loss,weakness, recent illness HEENT- denies eye drainage, change in vision, nasal discharge, CVS- denies chest pain, palpitations RESP- denies SOB, cough, wheeze ABD- denies N/V, change in stools, abd pain GU- denies dysuria, hematuria, dribbling, incontinence MSK- denies joint pain, muscle aches, injury Neuro- denies headache, dizziness, syncope, seizure activity       Objective:    BP 126/74 mmHg  Pulse 72  Temp(Src) 98.4 F (36.9 C) (Oral)  Resp 16  Ht 5\' 6"  (1.676 m)  Wt 270 lb (122.471 kg)  BMI 43.60 kg/m2 GEN- NAD, alert and oriented x3 CVS- RRR, no murmur RESP-CTAB ABD-NABS,soft,NT,ND GU- normal external genitalia, vaginal mucosa pink and moist, cervix visualized no growth, + dark brown blood form os, + discharge, no CMT, no ovarian masses, uterus normal size Rectum- normal tone, Soft stool in vault, FOBT negative        Assessment & Plan:      Problem List Items Addressed This Visit    Post-menopausal bleeding - Primary   Relevant Orders      WET PREP FOR [name], YEAST, CLUE      Note: This dictation was prepared with Dragon dictation along with smaller phrase technology. Any transcriptional errors that result from this process are unintentional.

## 2014-06-18 NOTE — Assessment & Plan Note (Signed)
seen by GYN, no rectal involvement, discharge noted, wet prep done. Continue Megace, repeat US in 6 months

## 2014-06-18 NOTE — Progress Notes (Signed)
No answer at either mobile or cell number, will try again later.

## 2014-06-18 NOTE — Patient Instructions (Signed)
Continue megace as prescribed once a day Spiriva Respimat take 2 puffs once a day  F/U as previous

## 2014-07-21 ENCOUNTER — Other Ambulatory Visit: Payer: Self-pay | Admitting: Family Medicine

## 2014-07-21 MED ORDER — HYDROCODONE-ACETAMINOPHEN 7.5-325 MG PO TABS: 1.0000 | ORAL_TABLET | Freq: Four times a day (QID) | ORAL | Status: DC | PRN

## 2014-07-21 NOTE — Telephone Encounter (Signed)
Patient's husband has appointment with dr Buelah Manis today and would like to know if she could pick up her pain medication when she is here today  [phone]

## 2014-07-21 NOTE — Telephone Encounter (Signed)
Refill done.  

## 2014-07-21 NOTE — Telephone Encounter (Signed)
Ok to refill??  Last office visit/ refill 06/18/2014.

## 2014-08-22 ENCOUNTER — Telehealth: Payer: Self-pay | Admitting: Family Medicine

## 2014-08-22 MED ORDER — HYDROCODONE-ACETAMINOPHEN 7.5-325 MG PO TABS: 1.0000 | ORAL_TABLET | Freq: Four times a day (QID) | ORAL | Status: DC | PRN

## 2014-08-22 NOTE — Telephone Encounter (Signed)
[phone]  PT is needing a refill on HYDROcodone-acetaminophen (NORCO) 7.5-325 MG per tablet

## 2014-08-22 NOTE — Telephone Encounter (Signed)
rx ready and pt aware 

## 2014-08-22 NOTE — Telephone Encounter (Signed)
Okay to refill? 

## 2014-08-22 NOTE — Telephone Encounter (Signed)
LRF 12/28 #45.  LOV 06/18/14.  OK refill?

## 2014-08-28 ENCOUNTER — Other Ambulatory Visit: Payer: Self-pay | Admitting: Family Medicine

## 2014-08-28 DIAGNOSIS — Z1231 Encounter for screening mammogram for malignant neoplasm of breast: Secondary | ICD-10-CM

## 2014-09-09 ENCOUNTER — Ambulatory Visit (INDEPENDENT_AMBULATORY_CARE_PROVIDER_SITE_OTHER): Payer: Medicare HMO | Admitting: Family Medicine

## 2014-09-09 ENCOUNTER — Encounter: Payer: Self-pay | Admitting: Family Medicine

## 2014-09-09 ENCOUNTER — Ambulatory Visit: Payer: Medicare HMO | Admitting: Family Medicine

## 2014-09-09 VITALS — BP 160/88 | HR 78 | Temp 98.6°F | Resp 18 | Ht 66.0 in | Wt 272.0 lb

## 2014-09-09 DIAGNOSIS — I10 Essential (primary) hypertension: Secondary | ICD-10-CM

## 2014-09-09 DIAGNOSIS — F4321 Adjustment disorder with depressed mood: Secondary | ICD-10-CM | POA: Diagnosis not present

## 2014-09-09 DIAGNOSIS — J439 Emphysema, unspecified: Secondary | ICD-10-CM | POA: Diagnosis not present

## 2014-09-09 MED ORDER — LORAZEPAM 1 MG PO TABS: 1.0000 mg | ORAL_TABLET | Freq: Two times a day (BID) | ORAL | Status: DC | PRN

## 2014-09-09 NOTE — Patient Instructions (Signed)
Take the ativan twice a day, you can cut it in half and take a whole tablet before bedtime Continue all other medications F/U 4 weeks- Fasting

## 2014-09-09 NOTE — Progress Notes (Signed)
Patient ID: [name], female   DOB: 06/28/1951, 64 y.o.   MRN: [id number]   Subjective:    Patient ID: [name], female    DOB: 08/15/1950, 64 y.o.   MRN: [id number]  Patient presents for 3 month F/U and Anxiety  patient here for follow-up. Unfortunately her husband passed away yesterday morning. She has had a very rough past few days because of his severe illness which resulted in his passing. She has not slept well the past couple weeks since he has been in and out of the hospital. She does have some support at home from her daughter and her son. She needs something to help calm her nerves. The funeral be this Saturday. She has been taking all of her medicines as prescribed except she is out of her Spiriva and request sample of this.    Review Of Systems:  GEN- denies fatigue, fever, weight loss,weakness, recent illness HEENT- denies eye drainage, change in vision, nasal discharge, CVS- denies chest pain, palpitations RESP- denies SOB, cough, wheeze ABD- denies N/V, change in stools, abd pain GU- denies dysuria, hematuria, dribbling, incontinence MSK- + joint pain, muscle aches, injury Neuro- denies headache, dizziness, syncope, seizure activity       Objective:    BP 160/88 mmHg  Pulse 78  Temp(Src) 98.6 F (37 C) (Oral)  Resp 18  Ht 5\' 6"  (1.676 m)  Wt 272 lb (123.378 kg)  BMI 43.92 kg/m2 GEN- NAD, alert and oriented x3 CVS- RRR, no murmur RESP-few scattered wheeze Psych- tearful, grief stricken, not overly anxious, no SI EXT- No edema Pulses- Radial 2+        Assessment & Plan:      Problem List Items Addressed This Visit    None    Visit Diagnoses    Grief reaction    -  Primary    Will give ativan 1mg , she can take break in half if needed, support for family       Note: This dictation was prepared with Dragon dictation along with smaller phrase technology. Any transcriptional errors that result from this process are unintentional.

## 2014-09-09 NOTE — Assessment & Plan Note (Signed)
She was given 2 boxes of Spiriva capsules

## 2014-09-09 NOTE — Assessment & Plan Note (Signed)
Bp is elevated son today however typically well-controlled I think this is due to her grief I will not change her medication today

## 2014-09-19 ENCOUNTER — Telehealth: Payer: Self-pay | Admitting: Family Medicine

## 2014-09-19 MED ORDER — HYDROCODONE-ACETAMINOPHEN 7.5-325 MG PO TABS: 1.0000 | ORAL_TABLET | Freq: Four times a day (QID) | ORAL | Status: DC | PRN

## 2014-09-19 NOTE — Telephone Encounter (Signed)
Okay to refill? 

## 2014-09-19 NOTE — Telephone Encounter (Signed)
Patient would like refill on her hydrocodone  [phone]

## 2014-09-19 NOTE — Telephone Encounter (Signed)
Prescription printed and patient made aware to come to office to pick up.  

## 2014-09-19 NOTE — Telephone Encounter (Signed)
Ok to refill??  Last office visit 09/08/2014.  Last refill 08/22/2014.

## 2014-09-29 ENCOUNTER — Ambulatory Visit (HOSPITAL_COMMUNITY)
Admission: RE | Admit: 2014-09-29 | Discharge: 2014-09-29 | Disposition: A | Discharge status: Home / Self Care | Payer: Medicare HMO | Source: Ambulatory Visit | Attending: Family Medicine | Admitting: Family Medicine

## 2014-09-29 DIAGNOSIS — Z1231 Encounter for screening mammogram for malignant neoplasm of breast: Secondary | ICD-10-CM | POA: Diagnosis not present

## 2014-10-07 ENCOUNTER — Ambulatory Visit (INDEPENDENT_AMBULATORY_CARE_PROVIDER_SITE_OTHER): Payer: Medicare HMO | Admitting: Family Medicine

## 2014-10-07 ENCOUNTER — Encounter: Payer: Self-pay | Admitting: Family Medicine

## 2014-10-07 VITALS — BP 168/96 | HR 88 | Temp 98.1°F | Resp 20 | Wt 270.0 lb

## 2014-10-07 DIAGNOSIS — J439 Emphysema, unspecified: Secondary | ICD-10-CM | POA: Diagnosis not present

## 2014-10-07 DIAGNOSIS — I10 Essential (primary) hypertension: Secondary | ICD-10-CM

## 2014-10-07 DIAGNOSIS — E785 Hyperlipidemia, unspecified: Secondary | ICD-10-CM

## 2014-10-07 DIAGNOSIS — R7302 Impaired glucose tolerance (oral): Secondary | ICD-10-CM

## 2014-10-07 LAB — CBC WITH DIFFERENTIAL/PLATELET
BASOS PCT: 1 % (ref 0–1)
Basophils Absolute: 0.1 10*3/uL (ref 0.0–0.1)
EOS ABS: 0.4 10*3/uL (ref 0.0–0.7)
EOS PCT: 5 % (ref 0–5)
HCT: 40.6 % (ref 36.0–46.0)
Hemoglobin: 13.3 g/dL (ref 12.0–15.0)
LYMPHS ABS: 2.7 10*3/uL (ref 0.7–4.0)
Lymphocytes Relative: 34 % (ref 12–46)
MCH: 28.6 pg (ref 26.0–34.0)
MCHC: 32.8 g/dL (ref 30.0–36.0)
MCV: 87.3 fL (ref 78.0–100.0)
MONOS PCT: 8 % (ref 3–12)
MPV: 9.1 fL (ref 8.6–12.4)
Monocytes Absolute: 0.6 10*3/uL (ref 0.1–1.0)
Neutro Abs: 4.1 10*3/uL (ref 1.7–7.7)
Neutrophils Relative %: 52 % (ref 43–77)
Platelets: 307 10*3/uL (ref 150–400)
RBC: 4.65 MIL/uL (ref 3.87–5.11)
RDW: 15 % (ref 11.5–15.5)
WBC: 7.8 10*3/uL (ref 4.0–10.5)

## 2014-10-07 LAB — COMPREHENSIVE METABOLIC PANEL
ALT: 11 U/L (ref 0–35)
AST: 12 U/L (ref 0–37)
Albumin: 3.6 g/dL (ref 3.5–5.2)
Alkaline Phosphatase: 70 U/L (ref 39–117)
BILIRUBIN TOTAL: 0.6 mg/dL (ref 0.2–1.2)
BUN: 9 mg/dL (ref 6–23)
CO2: 27 mEq/L (ref 19–32)
CREATININE: 1.01 mg/dL (ref 0.50–1.10)
Calcium: 9 mg/dL (ref 8.4–10.5)
Chloride: 104 mEq/L (ref 96–112)
GLUCOSE: 97 mg/dL (ref 70–99)
Potassium: 4.5 mEq/L (ref 3.5–5.3)
Sodium: 138 mEq/L (ref 135–145)
Total Protein: 7.3 g/dL (ref 6.0–8.3)

## 2014-10-07 LAB — HEMOGLOBIN A1C
Hgb A1c MFr Bld: 5.9 % — ABNORMAL HIGH (ref <5.7)
Mean Plasma Glucose: 123 mg/dL — ABNORMAL HIGH (ref <117)

## 2014-10-07 LAB — LIPID PANEL
Cholesterol: 257 mg/dL — ABNORMAL HIGH (ref 0–200)
HDL: 41 mg/dL — ABNORMAL LOW (ref >=46)
LDL CALC: 187 mg/dL — AB (ref 0–99)
Total CHOL/HDL Ratio: 6.3 Ratio
Triglycerides: 145 mg/dL (ref <150)
VLDL: 29 mg/dL (ref 0–40)

## 2014-10-07 MED ORDER — HYDROCODONE-ACETAMINOPHEN 7.5-325 MG PO TABS: 1.0000 | ORAL_TABLET | Freq: Four times a day (QID) | ORAL | Status: DC | PRN

## 2014-10-07 NOTE — Assessment & Plan Note (Signed)
Continue with her current dose It did provide her with the sample. she now has Medicaid as well as some supplements to help with her medication so hopefully she'll be to keep her inhalers on a regular basis

## 2014-10-07 NOTE — Assessment & Plan Note (Signed)
Blood pressure is well-controlled and change medication

## 2014-10-07 NOTE — Assessment & Plan Note (Signed)
Check lipid panel  

## 2014-10-07 NOTE — Progress Notes (Signed)
Patient ID: [name], female   DOB: Sep 07, 1950, 64 y.o.   MRN: [id number]   Subjective:    Patient ID: [name], female    DOB: 13-Sep-1950, 64 y.o.   MRN: [id number]  Patient presents for 1 month follow up  patient is here for interim follow-up on her chronic medical problems in for fasting labs. Her husband passed away at her last visit. She's been using Ativan at bedtime to help her sleep. She has some support from her children. She states in general she is doing okay.   she is taking all of her medications as prescribed and she has no specific concerns today    Review Of Systems:  GEN- denies fatigue, fever, weight loss,weakness, recent illness HEENT- denies eye drainage, change in vision, nasal discharge, CVS- denies chest pain, palpitations RESP- denies SOB, cough, wheeze ABD- denies N/V, change in stools, abd pain GU- denies dysuria, hematuria, dribbling, incontinence MSK- denies joint pain, muscle aches, injury Neuro- denies headache, dizziness, syncope, seizure activity       Objective:    BP 168/96 mmHg  Pulse 88  Temp(Src) 98.1 F (36.7 C) (Oral)  Resp 20  Wt 270 lb (122.471 kg) GEN- NAD, alert and oriented x3 HEENT- PERRL, EOMI, non injected sclera, pink conjunctiva, MMM, oropharynx clear Neck- Supple CVS- RRR, no murmur RESP-CTAB EXT- No edema Pulses- Radial, DP- 2+        Assessment & Plan:      Problem List Items Addressed This Visit      Unprioritized   Morbid obesity   Hyperlipidemia   Relevant Orders   Lipid panel   Glucose intolerance (impaired glucose tolerance)   Relevant Orders   Hemoglobin A1c   Essential hypertension - Primary   Relevant Orders   CBC with Differential/Platelet   Comprehensive metabolic panel      Note: This dictation was prepared with Dragon dictation along with smaller phrase technology. Any transcriptional errors that result from this process are unintentional.

## 2014-10-07 NOTE — Patient Instructions (Signed)
Continue current medications We will call with lab results  F/U 3 months  

## 2014-10-07 NOTE — Assessment & Plan Note (Signed)
We'll recheck her A1c she does have obesity is higher risk for developing diabetes mellitus. Of note regarding her grief she can continue the Ativan at bedtime. We also sent a shingles vaccine to the pharmacy

## 2014-10-08 ENCOUNTER — Telehealth: Payer: Self-pay | Admitting: Family Medicine

## 2014-10-08 NOTE — Telephone Encounter (Signed)
[facility] called and order given for pt to receive Zostavax.

## 2014-10-10 ENCOUNTER — Other Ambulatory Visit: Payer: Self-pay | Admitting: *Deleted

## 2014-10-10 MED ORDER — ATORVASTATIN CALCIUM 10 MG PO TABS: 10.0000 mg | ORAL_TABLET | Freq: Every day | ORAL | Status: DC

## 2014-10-24 ENCOUNTER — Encounter: Payer: Self-pay | Admitting: Family Medicine

## 2014-11-12 ENCOUNTER — Other Ambulatory Visit: Payer: Self-pay | Admitting: Family Medicine

## 2014-11-12 ENCOUNTER — Encounter: Payer: Self-pay | Admitting: Critical Care Medicine

## 2014-11-12 ENCOUNTER — Ambulatory Visit (INDEPENDENT_AMBULATORY_CARE_PROVIDER_SITE_OTHER): Payer: Medicare HMO | Admitting: Critical Care Medicine

## 2014-11-12 VITALS — BP 122/78 | HR 92 | Temp 98.1°F | Ht 65.0 in | Wt 276.4 lb

## 2014-11-12 DIAGNOSIS — J439 Emphysema, unspecified: Secondary | ICD-10-CM

## 2014-11-12 DIAGNOSIS — Z23 Encounter for immunization: Secondary | ICD-10-CM | POA: Diagnosis not present

## 2014-11-12 MED ORDER — TIOTROPIUM BROMIDE MONOHYDRATE 18 MCG IN CAPS: 18.0000 ug | ORAL_CAPSULE | Freq: Every day | RESPIRATORY_TRACT | Status: DC

## 2014-11-12 NOTE — Progress Notes (Signed)
  Subjective:    Patient ID: [name], female    DOB: [birth year], 64 y.o.   MRN: [id number]  HPI  63 y.o. African American female with a history of obstructive sleep apnea, chronic obstructive lung disease, asthmatic bronchitis.   11/12/2014 Chief Complaint  Patient presents with  . Follow-up    lost husband in February 2016, feeling very fatigued. slight cough.   Pt lost her husband 08/2014. Also lost sister in [place], and cousin all at once. Pt with min cough.  Pt still with dyspnea, feels tired.  No real wheeze.  No mucus to expectorate. Now getting spiriva with medicaid. On oxygen with cpap at [facility].  This patient continues to use and benefit from her oxygen therapy and has re qualified at this visit for continued oxygen therapy   Review of Systems 11 pt ros neg except as above in hpi    Objective:   Physical Exam  Filed Vitals:   11/12/14 1013  BP: 122/78  Pulse: 92  Temp: 98.1 F (36.7 C)  TempSrc: Oral  Height: 5\' 5"  (1.651 m)  Weight: 276 lb 6.4 oz (125.374 kg)  SpO2: 95%    Gen: Pleasant, obese in no distress,  normal affect  ENT: No lesions,  mouth clear,  oropharynx clear, no postnasal drip  Neck: No JVD, no TMG, no carotid bruits  Lungs: No use of accessory muscles, no dullness to percussion, distant breath sounds without wheezes or rhonchi  Cardiovascular: RRR, heart sounds normal, no murmur or gallops, no peripheral edema  Abdomen: soft and NT, no HSM,  BS normal  Musculoskeletal: No deformities, no cyanosis or clubbing  Neuro: alert, non focal  Skin: Warm, no lesions or rashes  No results found.       Assessment & Plan:   COPD with emphysema gold stage B Gold B copd stable Plan No change in inhaled or maintenance medications. prevnar 13

## 2014-11-12 NOTE — Assessment & Plan Note (Signed)
Gold B copd stable Plan No change in inhaled or maintenance medications. prevnar 13

## 2014-11-12 NOTE — Patient Instructions (Signed)
No change in medications. Return in           6 months Refills on spiriva sent Prevnar 13 vaccine given

## 2014-11-13 NOTE — Telephone Encounter (Signed)
LRF 09/09/14 #45 + 1.  LOV 10/07/14  OK refill?

## 2014-11-14 ENCOUNTER — Telehealth: Payer: Self-pay | Admitting: Family Medicine

## 2014-11-14 MED ORDER — HYDROCODONE-ACETAMINOPHEN 7.5-325 MG PO TABS: 1.0000 | ORAL_TABLET | Freq: Four times a day (QID) | ORAL | Status: DC | PRN

## 2014-11-14 NOTE — Telephone Encounter (Signed)
Ok to refill Hydrocodone??  Last office visit/ refill 10/07/2014.   Returned call to patient.   Reports that she would like to refill Ativan x1 month, but would like to only use at bedtime.   Ok to refill Ativan??

## 2014-11-14 NOTE — Telephone Encounter (Signed)
Okay to refill give 3 

## 2014-11-14 NOTE — Telephone Encounter (Signed)
rx called in

## 2014-11-14 NOTE — Telephone Encounter (Signed)
Ativan has been called to pharmacy.   Prescription printed and patient made aware to come to office to pick up.

## 2014-11-14 NOTE — Telephone Encounter (Signed)
Okay to refill both I already recived something from Shelby about the ativan

## 2014-11-14 NOTE — Telephone Encounter (Signed)
Patient is calling to get refill on hydrocodone and also wants to ask questions about her anxiety medication  [phone]

## 2014-11-24 ENCOUNTER — Encounter: Payer: Self-pay | Admitting: Family Medicine

## 2014-12-15 ENCOUNTER — Telehealth: Payer: Self-pay | Admitting: Family Medicine

## 2014-12-15 MED ORDER — HYDROCODONE-ACETAMINOPHEN 7.5-325 MG PO TABS: 1.0000 | ORAL_TABLET | Freq: Four times a day (QID) | ORAL | Status: DC | PRN

## 2014-12-15 NOTE — Telephone Encounter (Signed)
Patient returned call and made aware.

## 2014-12-15 NOTE — Telephone Encounter (Signed)
Patient calling to get refill on hydrocodone  [phone]

## 2014-12-15 NOTE — Telephone Encounter (Signed)
Okay to refill? 

## 2014-12-15 NOTE — Telephone Encounter (Signed)
Prescription printed.   Call placed to patient. No answer. No VM. 

## 2014-12-15 NOTE — Telephone Encounter (Signed)
Ok to refill??  Last office visit 10/07/2014.  Last refill 11/14/2014.

## 2014-12-19 ENCOUNTER — Other Ambulatory Visit: Payer: Self-pay | Admitting: Family Medicine

## 2014-12-19 NOTE — Telephone Encounter (Signed)
Lorazepam was just refilled last month + 2 refills.  pharmacy called and order clarified

## 2015-01-09 ENCOUNTER — Ambulatory Visit: Payer: Medicare HMO | Admitting: Family Medicine

## 2015-01-13 ENCOUNTER — Other Ambulatory Visit: Payer: Self-pay | Admitting: Family Medicine

## 2015-01-13 NOTE — Telephone Encounter (Signed)
Refill appropriate and filled per protocol. 

## 2015-01-16 ENCOUNTER — Telehealth: Payer: Self-pay | Admitting: Family Medicine

## 2015-01-16 MED ORDER — HYDROCODONE-ACETAMINOPHEN 7.5-325 MG PO TABS: 1.0000 | ORAL_TABLET | Freq: Four times a day (QID) | ORAL | Status: DC | PRN

## 2015-01-16 NOTE — Telephone Encounter (Signed)
Ok to refill??  Last office visit 10/07/2014.  Last refill 12/15/2014.

## 2015-01-16 NOTE — Telephone Encounter (Signed)
Patient calling to get refill on her hydrocodone  [phone]

## 2015-01-16 NOTE — Telephone Encounter (Signed)
Prescription printed and patient made aware to come to office to pick up.   Patient states that she will pick up on Monday.

## 2015-01-16 NOTE — Telephone Encounter (Signed)
Okay to refill? 

## 2015-02-16 ENCOUNTER — Telehealth: Payer: Self-pay | Admitting: Family Medicine

## 2015-02-16 ENCOUNTER — Other Ambulatory Visit: Payer: Self-pay | Admitting: Family Medicine

## 2015-02-16 MED ORDER — HYDROCODONE-ACETAMINOPHEN 7.5-325 MG PO TABS: 1.0000 | ORAL_TABLET | Freq: Four times a day (QID) | ORAL | Status: DC | PRN

## 2015-02-16 NOTE — Telephone Encounter (Signed)
Patient calling for refill on her hydrocodone  [phone]

## 2015-02-16 NOTE — Telephone Encounter (Signed)
Prescription printed and patient made aware to come to office to pick up on 02/16/2015 after 2pm.   Patient states that she has no money to pay bill, but she does have Medicare and Medicaid and is concerned that she should not have a bill at all.   Advised to discuss with office manager when she comes to pick up prescription.

## 2015-02-16 NOTE — Telephone Encounter (Signed)
Medication filled x1 with no refills.   Requires office visit before any further refills can be given.   Call placed to patient and patient made aware.

## 2015-02-16 NOTE — Telephone Encounter (Signed)
Okay to refill reschedule appt

## 2015-02-16 NOTE — Telephone Encounter (Signed)
Ok to refill??  Last office visit 10/07/2014. (of note, NS on 01/09/2015)  Last refill 01/16/2015.

## 2015-02-24 ENCOUNTER — Ambulatory Visit (INDEPENDENT_AMBULATORY_CARE_PROVIDER_SITE_OTHER): Payer: Medicare HMO | Admitting: Family Medicine

## 2015-02-24 ENCOUNTER — Encounter: Payer: Self-pay | Admitting: Family Medicine

## 2015-02-24 VITALS — BP 130/78 | HR 76 | Temp 98.6°F | Resp 12 | Ht 65.0 in | Wt 274.0 lb

## 2015-02-24 DIAGNOSIS — M159 Polyosteoarthritis, unspecified: Secondary | ICD-10-CM

## 2015-02-24 DIAGNOSIS — M150 Primary generalized (osteo)arthritis: Secondary | ICD-10-CM

## 2015-02-24 DIAGNOSIS — F32A Depression, unspecified: Secondary | ICD-10-CM

## 2015-02-24 DIAGNOSIS — I10 Essential (primary) hypertension: Secondary | ICD-10-CM | POA: Diagnosis not present

## 2015-02-24 DIAGNOSIS — F329 Major depressive disorder, single episode, unspecified: Secondary | ICD-10-CM | POA: Diagnosis not present

## 2015-02-24 DIAGNOSIS — M7730 Calcaneal spur, unspecified foot: Secondary | ICD-10-CM | POA: Diagnosis not present

## 2015-02-24 DIAGNOSIS — E785 Hyperlipidemia, unspecified: Secondary | ICD-10-CM | POA: Diagnosis not present

## 2015-02-24 DIAGNOSIS — M8949 Other hypertrophic osteoarthropathy, multiple sites: Secondary | ICD-10-CM

## 2015-02-24 LAB — CBC WITH DIFFERENTIAL/PLATELET
Basophils Absolute: 0.1 10*3/uL (ref 0.0–0.1)
Basophils Relative: 1 % (ref 0–1)
Eosinophils Absolute: 0.5 10*3/uL (ref 0.0–0.7)
Eosinophils Relative: 6 % — ABNORMAL HIGH (ref 0–5)
HCT: 36.9 % (ref 36.0–46.0)
Hemoglobin: 12.5 g/dL (ref 12.0–15.0)
LYMPHS PCT: 38 % (ref 12–46)
Lymphs Abs: 3 10*3/uL (ref 0.7–4.0)
MCH: 30.4 pg (ref 26.0–34.0)
MCHC: 33.9 g/dL (ref 30.0–36.0)
MCV: 89.8 fL (ref 78.0–100.0)
MPV: 9.2 fL (ref 8.6–12.4)
Monocytes Absolute: 0.6 10*3/uL (ref 0.1–1.0)
Monocytes Relative: 8 % (ref 3–12)
Neutro Abs: 3.8 10*3/uL (ref 1.7–7.7)
Neutrophils Relative %: 47 % (ref 43–77)
Platelets: 278 10*3/uL (ref 150–400)
RBC: 4.11 MIL/uL (ref 3.87–5.11)
RDW: 15.3 % (ref 11.5–15.5)
WBC: 8 10*3/uL (ref 4.0–10.5)

## 2015-02-24 LAB — COMPREHENSIVE METABOLIC PANEL
ALT: 26 U/L (ref 6–29)
AST: 12 U/L (ref 10–35)
Albumin: 3.6 g/dL (ref 3.6–5.1)
Alkaline Phosphatase: 88 U/L (ref 33–130)
BILIRUBIN TOTAL: 0.7 mg/dL (ref 0.2–1.2)
BUN: 11 mg/dL (ref 7–25)
CHLORIDE: 106 mmol/L (ref 98–110)
CO2: 27 mmol/L (ref 20–31)
CREATININE: 0.88 mg/dL (ref 0.50–0.99)
Calcium: 8.9 mg/dL (ref 8.6–10.4)
Glucose, Bld: 93 mg/dL (ref 70–99)
POTASSIUM: 4.5 mmol/L (ref 3.5–5.3)
Sodium: 140 mmol/L (ref 135–146)
Total Protein: 6.7 g/dL (ref 6.1–8.1)

## 2015-02-24 LAB — LIPID PANEL
CHOL/HDL RATIO: 4.6 ratio (ref <=5.0)
CHOLESTEROL: 152 mg/dL (ref 125–200)
HDL: 33 mg/dL — ABNORMAL LOW (ref >=46)
LDL CALC: 94 mg/dL (ref <130)
Triglycerides: 127 mg/dL (ref <150)
VLDL: 25 mg/dL (ref <30)

## 2015-02-24 MED ORDER — LORAZEPAM 1 MG PO TABS: 1.0000 mg | ORAL_TABLET | Freq: Two times a day (BID) | ORAL | Status: DC | PRN

## 2015-02-24 MED ORDER — TRAZODONE HCL 50 MG PO TABS: 25.0000 mg | ORAL_TABLET | Freq: Every evening | ORAL | Status: DC | PRN

## 2015-02-24 NOTE — Assessment & Plan Note (Signed)
OA multiple joints, including knees, also has Heels spurs which due to finances and she is a widow unable to undergo a surgery, with 8 week recuperation Continue Norco, will try to get her an Aide to assist with cleaning, cooking, things around the home, unable to stand long periods of time

## 2015-02-24 NOTE — Progress Notes (Signed)
Patient ID: [name], female   DOB: [birth year], 64 y.o.   MRN: [id number]   Subjective:    Patient ID: [name], female    DOB: 12-08-1950, 64 y.o.   MRN: [id number]  Patient presents for Medication Review/ Refill  Pt here to f/u medications, no particular concerns. She is living alone since husband died, her children help with some lawn work otherwise she is on her own. She does get food stamps. Her chronic pain keeps her from doing things around the home. She asks for an Aide to help her with cooking and cleaning. She is taking all her medications, but still does not sleep well at night. She starts thinking about "Fred".  Doing well with Spiriva, also using CPAP as prescribed     Review Of Systems:  GEN- denies fatigue, fever, weight loss,weakness, recent illness HEENT- denies eye drainage, change in vision, nasal discharge, CVS- denies chest pain, palpitations RESP- denies SOB, cough, wheeze ABD- denies N/V, change in stools, abd pain GU- denies dysuria, hematuria, dribbling, incontinence MSK- +joint pain, muscle aches, injury Neuro- denies headache, dizziness, syncope, seizure activity       Objective:    BP 130/78 mmHg  Pulse 76  Temp(Src) 98.6 F (37 C) (Oral)  Resp 12  Ht 5\' 5"  (1.651 m)  Wt 274 lb (124.286 kg)  BMI 45.60 kg/m2 GEN- NAD, alert and oriented x3 HEENT- PERRL, EOMI, non injected sclera, pink conjunctiva, MMM, oropharynx clear Neck- Supple, no thyromegaly CVS- RRR, no murmur RESP-CTAB EXT- trace pedal edema Skin- thickned toenails, dry flaky feet, dirt on soles bilat  Pulses- Radial, DP- 2+        Assessment & Plan:      Problem List Items Addressed This Visit    None      Note: This dictation was prepared with Dragon dictation along with smaller phrase technology. Any transcriptional errors that result from this process are unintentional.

## 2015-02-24 NOTE — Patient Instructions (Signed)
New pill trazodone to help with sleep and depression- take 1/2 tablet first Continue all other meds We will call with lab results PCS form to help you get an Aide F/U 3 months

## 2015-02-24 NOTE — Assessment & Plan Note (Signed)
Well controlled 

## 2015-02-24 NOTE — Assessment & Plan Note (Signed)
Depressed mood since husband passed, also with insomnia, she will continue ativan as needed, but I will add Trazodone, to decrease use of benzo, as this is now becoming a long term medication

## 2015-03-14 ENCOUNTER — Other Ambulatory Visit: Payer: Self-pay | Admitting: Family Medicine

## 2015-03-16 ENCOUNTER — Other Ambulatory Visit: Payer: Self-pay | Admitting: Family Medicine

## 2015-03-16 NOTE — Telephone Encounter (Signed)
Refill appropriate and filled per protocol. 

## 2015-03-19 ENCOUNTER — Telehealth: Payer: Self-pay | Admitting: Family Medicine

## 2015-03-19 MED ORDER — HYDROCODONE-ACETAMINOPHEN 7.5-325 MG PO TABS: 1.0000 | ORAL_TABLET | Freq: Four times a day (QID) | ORAL | Status: DC | PRN

## 2015-03-19 NOTE — Telephone Encounter (Signed)
Prescription printed and patient made aware to come to office to pick up on 03/20/2015.

## 2015-03-19 NOTE — Telephone Encounter (Signed)
Patient called requesting prescription on HYDROcodone-acetaminophen ([place]) 7.5-325 MG

## 2015-03-19 NOTE — Telephone Encounter (Signed)
Okay to refill? 

## 2015-03-19 NOTE — Telephone Encounter (Signed)
Ok to refill??  Last office visit 02/24/2015.  Last refill 02/16/2015.

## 2015-03-23 ENCOUNTER — Telehealth: Payer: Self-pay | Admitting: Family Medicine

## 2015-03-23 NOTE — Telephone Encounter (Signed)
Routed disability forms to sabrina on 03-23-15

## 2015-03-24 NOTE — Telephone Encounter (Signed)
I received Disability forms on this pt and I contacted her and pt states that she has been on disability since "33" for bleeding disorders, rare type of arthritis, Depression,etc (says Dr. Keturah Barre knows rest).  Pt is aware of the 20 dollar form fee  I filled out provider information and routed to Dr. Buelah Manis.

## 2015-03-26 NOTE — Telephone Encounter (Signed)
Received ppw back on desk, contacted pt and is aware are ready and states she will come to office to pick up forms and to pay the form fee.  Pt copy is in folder up front, copies have been made.

## 2015-04-07 ENCOUNTER — Telehealth: Payer: Self-pay | Admitting: *Deleted

## 2015-04-07 ENCOUNTER — Other Ambulatory Visit: Payer: Self-pay | Admitting: *Deleted

## 2015-04-07 MED ORDER — HYDROCODONE-ACETAMINOPHEN 7.5-325 MG PO TABS: 1.0000 | ORAL_TABLET | Freq: Four times a day (QID) | ORAL | Status: DC | PRN

## 2015-04-07 NOTE — Telephone Encounter (Signed)
Okay to refill? 

## 2015-04-07 NOTE — Telephone Encounter (Signed)
Prescription printed and patient made aware to come to office to pick up on 04/10/2015.

## 2015-04-07 NOTE — Addendum Note (Signed)
Addended by: Sheral Flow on: 04/07/2015 05:20 PM   Modules accepted: Orders

## 2015-04-07 NOTE — Telephone Encounter (Signed)
Noted prescription for Norco is due.   Ok to refill??  Last office visit 02/24/2015  Last refill 03/19/2015.

## 2015-04-24 ENCOUNTER — Other Ambulatory Visit: Payer: Self-pay | Admitting: Family Medicine

## 2015-05-05 NOTE — Telephone Encounter (Signed)
I have refiled the 2 office visits to Medicaid. Patient doesn't have a balance here.

## 2015-05-11 ENCOUNTER — Ambulatory Visit: Payer: Medicare HMO | Admitting: Pulmonary Disease

## 2015-05-14 ENCOUNTER — Ambulatory Visit (INDEPENDENT_AMBULATORY_CARE_PROVIDER_SITE_OTHER): Payer: Medicare HMO | Admitting: Pulmonary Disease

## 2015-05-14 ENCOUNTER — Telehealth: Payer: Self-pay | Admitting: Pulmonary Disease

## 2015-05-14 ENCOUNTER — Encounter: Payer: Self-pay | Admitting: Pulmonary Disease

## 2015-05-14 VITALS — BP 140/80 | HR 75 | Ht 65.0 in | Wt 271.2 lb

## 2015-05-14 DIAGNOSIS — J984 Other disorders of lung: Secondary | ICD-10-CM

## 2015-05-14 DIAGNOSIS — J45909 Unspecified asthma, uncomplicated: Secondary | ICD-10-CM | POA: Diagnosis not present

## 2015-05-14 DIAGNOSIS — E662 Morbid (severe) obesity with alveolar hypoventilation: Secondary | ICD-10-CM | POA: Diagnosis not present

## 2015-05-14 DIAGNOSIS — Z23 Encounter for immunization: Secondary | ICD-10-CM

## 2015-05-14 DIAGNOSIS — Z9989 Dependence on other enabling machines and devices: Secondary | ICD-10-CM

## 2015-05-14 DIAGNOSIS — K219 Gastro-esophageal reflux disease without esophagitis: Secondary | ICD-10-CM

## 2015-05-14 DIAGNOSIS — G4733 Obstructive sleep apnea (adult) (pediatric): Secondary | ICD-10-CM

## 2015-05-14 MED ORDER — RANITIDINE HCL 150 MG PO TABS: 150.0000 mg | ORAL_TABLET | Freq: Every day | ORAL | Status: DC

## 2015-05-14 NOTE — Patient Instructions (Signed)
1. Continue taking your Spiriva as prescribed. 2. I'm starting you on Zantac at night to help with your heartburn. Please avoid eating or drinking excessively within 2-3 hours of bedtime. 3. We will be performing breathing tests at your follow-up appointment to see how your lungs are working. 4. I will see you back in 3 months but please call my office if you have any new breathing problems before your next appointment.

## 2015-05-14 NOTE — Telephone Encounter (Signed)
Called and spoke to pt. Pt needing 3 month OV with [facility]or, with 6MWT and PFT. OV and 6MWT scheduled for 08/11/2015 and PFT scheduled at [facility] on 08/07/15 at 1000. Pt verbalized understanding and denied any further questions or concerns at this time.

## 2015-05-14 NOTE — Progress Notes (Signed)
Subjective:    Patient ID: [name], female    DOB: [birth year], 64 y.o.   MRN: [id number]  C.C.:  Follow-up for Asthma, Restrictive Lung Disease, OSA/OHS, & GERD.  HPI Asthma: She reports no new dyspnea. Does have occasional dyspnea on exertion. Has no stairs in her home. She reports she walks intermittently for exercise and hasn't really measured how far she can go. She reports a mild, intermittent cough that is nonproductive. She denies any history of recurrent bronchitis or pneumonia. Currently on Spiriva once daily. No wheezing. No exacerbations since last appointment. She does feel that Spiriva has helped her breathing significantly. She does routinely have sinus congestion, especially in the Spring.  Restrictive Lung Disease: Mild based on previous lung volumes. Likely secondary to obesity. Patient has never had CT imaging of her pulmonary parenchyma. No rashes or bruising.   OSA/OHS:  Currently on CPAP therapy religiously. She uses a nasal mask. Denies any daytime napping. She reports good quality of sleep.  GERD:  She denies any regular medication. She reports once a week symptoms of reflux. She does wake up with morning brash water taste 2-3 mornings a week.  Review of Systems  She reports intermittent chest tightness that is very brief. No frank pleurisy. No fever, chills, or sweats. She reports pain mostly in her feet from bone spurs & gout. Denies any hand stiffness.   No Known Allergies  Current Outpatient Prescriptions on File Prior to Visit  Medication Sig Dispense Refill  . allopurinol (ZYLOPRIM) 100 MG tablet TAKE 1 TABLET BY MOUTH EVERY DAY 30 tablet 0  . aspirin EC 325 MG tablet Take 325 mg by mouth daily.    Marland Kitchen atorvastatin (LIPITOR) 10 MG tablet TAKE 1 TABLET BY MOUTH EVERY DAY 30 tablet 3  . clotrimazole-betamethasone (LOTRISONE) cream Apply 1 application topically 2 (two) times daily as needed. To be applied to the feet as needed 45 g 3  . fluticasone  (CUTIVATE) 0.05 % cream APPLY EXTERNALLY TO THE AFFECTED AREA DAILY 30 g 3  . furosemide (LASIX) 40 MG tablet Take 1 tablet (40 mg total) by mouth daily. 90 tablet 1  . HYDROcodone-acetaminophen (NORCO) 7.5-325 MG per tablet Take 1 tablet by mouth every 6 (six) hours as needed for moderate pain. 45 tablet 0  . LORazepam (ATIVAN) 1 MG tablet Take 1 tablet (1 mg total) by mouth 2 (two) times daily as needed. for anxiety 45 tablet 2  . megestrol (MEGACE) 40 MG tablet Take 1 tablet (40 mg total) by mouth daily. 30 tablet 11  . naproxen (NAPROSYN) 500 MG tablet Take 1 tablet (500 mg total) by mouth 2 (two) times daily. 60 tablet 3  . NON FORMULARY at bedtime. CPAP and 1 lpm oxygen qhs    . tiotropium (SPIRIVA HANDIHALER) 18 MCG inhalation capsule Place 1 capsule (18 mcg total) into inhaler and inhale daily. 30 capsule 11  . traZODone (DESYREL) 50 MG tablet TAKE 1/2 TO 1 TABLET(25 TO 50 MG) BY MOUTH AT BEDTIME AS NEEDED FOR SLEEP 90 tablet 3   No current facility-administered medications on file prior to visit.    Past Medical History  Diagnosis Date  . Obesity hypoventilation syndrome ([place])   . Obstructive sleep apnea   . Osteoarthritis   . Low back pain   . HTN (hypertension)   . Hyperlipidemia   . GERD (gastroesophageal reflux disease)   . Depression   . Anxiety   . Gout   . Achilles tendinitis   .  Asthma     Past Surgical History  Procedure Laterality Date  . Tubal ligation    . Cardiac catheterization  2006  . Colonoscopy N/A 12/25/2013    Procedure: COLONOSCOPY;  Surgeon: Rogene Houston, MD;  Location: [facility];  Service: Endoscopy;  Laterality: N/A;  730-rescheduled Ann notified pt    Family History  Problem Relation Age of Onset  . Heart disease Mother   . Thyroid disease Mother   . Heart failure Father   . Lung disease Father   . Diabetes Brother   . Crohn's disease Daughter     Social History   Social History  . Marital Status: Married    Spouse Name: N/A    . Number of Children: N/A  . Years of Education: N/A   Occupational History  . disabled    Social History Main Topics  . Smoking status: Former Smoker -- 2.00 packs/day for 36 years    Types: Cigarettes    Start date: 06/25/1969    Quit date: 03/25/2005  . Smokeless tobacco: Never Used  . Alcohol Use: No  . Drug Use: No  . Sexual Activity: No   Other Topics Concern  . None   Social History Narrative   Originally from [place]. She has always lived in [place]. 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 No pets currently. No bird, mold, or hot tub exposure.    Objective:   Physical Exam Blood pressure 140/80, pulse 75, height 5\' 5"  (1.651 m), weight 271 lb 3.2 oz (123.016 kg), SpO2 98 %. General:  Awake. Alert. No acute distress. Morbidly obese female Integument: Warm and dry. No rash on exposed skin. No bruising. HEENT:  Moist mucus membranes. No oral ulcers. No scleral injection or icterus. PERRL. Cardiovascular:  Regular rate & rhythm. Trace edema lower extremities. Pulmonary:  Good aeration & clear to auscultation bilaterally. Symmetric chest wall expansion. No accessory muscle use on room air . Abdomen: Soft. Normal bowel sounds. Protuberant. Musculoskeletal:  Normal bulk and tone. Hand grip strength 5/5 bilaterally. No joint deformity or effusion appreciated.  PFT 04/10/13:  FVC 1.61 L (59%) FEV1 1.14 L (66%) FEV1/FVC 0.88 FEF 25-75 2.08 L (102%) positive bronchodilator response TLC 4.10 L (77%) RV 97% ERV 43% DLCO uncorrected 57%   LABS 02/24/15 CBC: 8.0/12.5/36.9/278 BMP: 140/4.5/106/27/11/0.88/93/8.9 LFT: 3.6/6.7/0.7/88/12/26  05/12/14 ABG:  7.40 / 44 / 66 / 92%    Assessment & Plan:   64 year old morbidly obese female with known underlying OSA/OHS. Patient reports excellent compliance with home CPAP therapy and quality of sleep. I reviewed the patient's prior pulmonary function testing which did show mild restriction as well as a  significant bronchodilator response but no evidence of airways obstruction. This would be more consistent with underlying asthma and given her history of seasonal allergies I suspect she represents more of the asthmatic phenotype. She denies any prior history of recurrent bronchitis or infection. The patient did also previously had a substantially decreased carbon dioxide diffusion capacity. Her pulmonary function testing will need to be reassessed to establish where she is at in terms of airways obstruction. She seems to have a symptomatic response to treatment with Spiriva and I will be continuing the inhaler at this time. She does have ongoing intermittent reflux which will likely benefit from medication therapy. I instructed patient to contact my office if she had any new breathing problems before next appointment.   1. Mild, intermittent asthma: Continuing Spiriva at this  time given symptomatic benefit. Checking full pulmonary function testing as well as 6 minute walk test at next appointment. 2. OSA/OHS: Continuing on CPAP therapy indefinitely. We'll controlled. 3. Restrictive lung disease: Mild on prior lung volumes from 2014. Repeating full pulmonary function testing along with 6 prolonged test at follow-up appointment. Holding on further chest imaging with CT scanning until this is complete. Suspect secondary to morbid obesity. 4. GERD: Initiating treatment with Zantac 150 mg by mouth daily at bedtime. Recommended avoiding excessive by mouth intake within 2-3 hours of bedtime. 5. Health maintenance: Patient received Prevnar vaccine at last appointment. Administering influenza vaccine today. 6. Follow-up: Patient will return to clinic in 3 months or sooner if needed.

## 2015-05-19 ENCOUNTER — Telehealth: Payer: Self-pay | Admitting: Family Medicine

## 2015-05-19 MED ORDER — HYDROCODONE-ACETAMINOPHEN 7.5-325 MG PO TABS: 1.0000 | ORAL_TABLET | Freq: Four times a day (QID) | ORAL | Status: DC | PRN

## 2015-05-19 NOTE — Telephone Encounter (Signed)
Prescription printed and patient made aware to come to office to pick up on 05/19/2015 after 2pm.

## 2015-05-19 NOTE — Telephone Encounter (Signed)
Pharmacy: pick up   Medication: HYDROcodone-acetaminophen (NORCO) 7.5-325 MG  Qty:  Sig: Take 1 tablet by mouth every 6 (six) hours as needed for moderate pain.  Physician: Dr. Buelah Manis  Patient's phone number: [phone]

## 2015-05-19 NOTE — Telephone Encounter (Signed)
Okay to refill? 

## 2015-05-21 ENCOUNTER — Other Ambulatory Visit: Payer: Self-pay | Admitting: Obstetrics & Gynecology

## 2015-05-22 ENCOUNTER — Other Ambulatory Visit: Payer: Self-pay | Admitting: Family Medicine

## 2015-05-22 MED ORDER — MEGESTROL ACETATE 40 MG PO TABS: 40.0000 mg | ORAL_TABLET | Freq: Every day | ORAL | Status: DC

## 2015-05-22 NOTE — Telephone Encounter (Signed)
Okay to refill? 

## 2015-05-22 NOTE — Telephone Encounter (Signed)
Given by OBGYN for postmenopausal bleeding.   Ok to refill?

## 2015-05-22 NOTE — Telephone Encounter (Signed)
Prescription sent to pharmacy.

## 2015-05-22 NOTE — Telephone Encounter (Signed)
PHARMACY: [facility]  MEDICATION: MEGESTROL   QTY:    SIG:    PHYSICIAN: [place]   PT. PHONE #: [phone]

## 2015-05-29 ENCOUNTER — Ambulatory Visit (INDEPENDENT_AMBULATORY_CARE_PROVIDER_SITE_OTHER): Payer: Medicare HMO | Admitting: Family Medicine

## 2015-05-29 ENCOUNTER — Encounter: Payer: Self-pay | Admitting: Family Medicine

## 2015-05-29 VITALS — BP 140/82 | HR 88 | Temp 97.4°F | Resp 16 | Ht 65.0 in | Wt 272.0 lb

## 2015-05-29 DIAGNOSIS — F32A Depression, unspecified: Secondary | ICD-10-CM

## 2015-05-29 DIAGNOSIS — F329 Major depressive disorder, single episode, unspecified: Secondary | ICD-10-CM

## 2015-05-29 DIAGNOSIS — I10 Essential (primary) hypertension: Secondary | ICD-10-CM | POA: Diagnosis not present

## 2015-05-29 DIAGNOSIS — M159 Polyosteoarthritis, unspecified: Secondary | ICD-10-CM

## 2015-05-29 DIAGNOSIS — M150 Primary generalized (osteo)arthritis: Secondary | ICD-10-CM | POA: Diagnosis not present

## 2015-05-29 DIAGNOSIS — M8949 Other hypertrophic osteoarthropathy, multiple sites: Secondary | ICD-10-CM

## 2015-05-29 NOTE — Assessment & Plan Note (Signed)
Trazodone has helped significantly, will continue with 1 tablet at bedtime, d/c ativan

## 2015-05-29 NOTE — Progress Notes (Signed)
Patient ID: [name], female   DOB: [birth year], 64 y.o.   MRN: [id number]   Subjective:    Patient ID: [name], female    DOB: 07-23-1951, 64 y.o.   MRN: [id number]  Patient presents for 3 month F/U follow-up medications. She was seen by her pulmonologist recently who is concerned that she may have restrictive lung disease is due to pulmonary emphysema she has some testing that is coming up ( reviewed Dr. Milinda Hirschfeld Note). She is still on her inhalers as well as her CPAP. She states that her breathing is good right now.  She is still taking pain medicine as needed for her heel spurs and osteoarthritis she also has a nurse aide with personal care services and this is helped her mood especially with her husband dying a few months ago. She is taking the trazodone and this helped with her mood as well as her sleep. She is no longer requiring Ativan    Review Of Systems:  GEN- denies fatigue, fever, weight loss,weakness, recent illness HEENT- denies eye drainage, change in vision, nasal discharge, CVS- denies chest pain, palpitations RESP- denies SOB, cough, wheeze ABD- denies N/V, change in stools, abd pain GU- denies dysuria, hematuria, dribbling, incontinence MSK- + joint pain, muscle aches, injury Neuro- denies headache, dizziness, syncope, seizure activity       Objective:    BP 140/82 mmHg  Pulse 88  Temp(Src) 97.4 F (36.3 C) (Oral)  Resp 16  Ht 5\' 5"  (1.651 m)  Wt 272 lb (123.378 kg)  BMI 45.26 kg/m2 GEN- NAD, alert and oriented x3 HEENT- PERRL, EOMI, non injected sclera, pink conjunctiva, MMM, oropharynx clear CVS- RRR, no murmur RESP-CTAB EXT- trace edema Psych- smiling, normal affect and mood, not depressed appearing  Pulses- Radial, DP- 2+        Assessment & Plan:      Problem List Items Addressed This Visit    Osteoarthritis - Primary   Morbid obesity ([place])   Essential hypertension   Depression      Note: This dictation was prepared with  Dragon dictation along with smaller phrase technology. Any transcriptional errors that result from this process are unintentional.

## 2015-05-29 NOTE — Assessment & Plan Note (Signed)
Controlled with lasix, discussed salt restriction, avoiding fried foods Normal labs 3 months ago

## 2015-05-29 NOTE — Assessment & Plan Note (Signed)
Continue with hydrocodone for this and her heel spurs

## 2015-05-29 NOTE — Patient Instructions (Signed)
Continue current medications F/U 4 months

## 2015-06-12 ENCOUNTER — Telehealth: Payer: Self-pay | Admitting: *Deleted

## 2015-06-12 MED ORDER — HYDROCODONE-ACETAMINOPHEN 7.5-325 MG PO TABS: 1.0000 | ORAL_TABLET | Freq: Four times a day (QID) | ORAL | Status: DC | PRN

## 2015-06-12 NOTE — Telephone Encounter (Signed)
Prescription printed and patient made aware to come to office to pick up on 06/15/2015. 

## 2015-06-12 NOTE — Telephone Encounter (Signed)
okay

## 2015-06-12 NOTE — Telephone Encounter (Signed)
Noted prescription for Norco due.  Ok to refill??  Last office visit 05/29/2015.  Last refill 05/19/2015.

## 2015-07-14 ENCOUNTER — Encounter: Payer: Self-pay | Admitting: Family Medicine

## 2015-07-14 ENCOUNTER — Ambulatory Visit (INDEPENDENT_AMBULATORY_CARE_PROVIDER_SITE_OTHER): Payer: Medicare HMO | Admitting: Family Medicine

## 2015-07-14 VITALS — BP 132/74 | HR 78 | Temp 98.0°F | Resp 14 | Ht 65.0 in | Wt 264.0 lb

## 2015-07-14 DIAGNOSIS — I10 Essential (primary) hypertension: Secondary | ICD-10-CM | POA: Diagnosis not present

## 2015-07-14 DIAGNOSIS — M1711 Unilateral primary osteoarthritis, right knee: Secondary | ICD-10-CM

## 2015-07-14 DIAGNOSIS — E785 Hyperlipidemia, unspecified: Secondary | ICD-10-CM | POA: Diagnosis not present

## 2015-07-14 DIAGNOSIS — R7302 Impaired glucose tolerance (oral): Secondary | ICD-10-CM

## 2015-07-14 DIAGNOSIS — Z0000 Encounter for general adult medical examination without abnormal findings: Secondary | ICD-10-CM | POA: Diagnosis not present

## 2015-07-14 LAB — LIPID PANEL
CHOL/HDL RATIO: 4.5 ratio (ref <=5.0)
CHOLESTEROL: 157 mg/dL (ref 125–200)
HDL: 35 mg/dL — ABNORMAL LOW (ref >=46)
LDL Cholesterol: 94 mg/dL (ref <130)
Triglycerides: 139 mg/dL (ref <150)
VLDL: 28 mg/dL (ref <30)

## 2015-07-14 LAB — CBC WITH DIFFERENTIAL/PLATELET
Basophils Absolute: 0.1 10*3/uL (ref 0.0–0.1)
Basophils Relative: 1 % (ref 0–1)
Eosinophils Absolute: 0.4 10*3/uL (ref 0.0–0.7)
Eosinophils Relative: 5 % (ref 0–5)
HEMATOCRIT: 39.7 % (ref 36.0–46.0)
HEMOGLOBIN: 13 g/dL (ref 12.0–15.0)
LYMPHS PCT: 38 % (ref 12–46)
Lymphs Abs: 2.7 10*3/uL (ref 0.7–4.0)
MCH: 29.8 pg (ref 26.0–34.0)
MCHC: 32.7 g/dL (ref 30.0–36.0)
MCV: 91.1 fL (ref 78.0–100.0)
MONO ABS: 0.8 10*3/uL (ref 0.1–1.0)
MONOS PCT: 11 % (ref 3–12)
MPV: 9.6 fL (ref 8.6–12.4)
NEUTROS PCT: 45 % (ref 43–77)
Neutro Abs: 3.2 10*3/uL (ref 1.7–7.7)
Platelets: 288 10*3/uL (ref 150–400)
RBC: 4.36 MIL/uL (ref 3.87–5.11)
RDW: 14.2 % (ref 11.5–15.5)
WBC: 7.2 10*3/uL (ref 4.0–10.5)

## 2015-07-14 LAB — COMPREHENSIVE METABOLIC PANEL
ALBUMIN: 4 g/dL (ref 3.6–5.1)
ALT: 9 U/L (ref 6–29)
AST: 11 U/L (ref 10–35)
Alkaline Phosphatase: 84 U/L (ref 33–130)
BILIRUBIN TOTAL: 0.7 mg/dL (ref 0.2–1.2)
BUN: 11 mg/dL (ref 7–25)
CO2: 24 mmol/L (ref 20–31)
Calcium: 9.2 mg/dL (ref 8.6–10.4)
Chloride: 103 mmol/L (ref 98–110)
Creat: 0.9 mg/dL (ref 0.50–0.99)
Glucose, Bld: 85 mg/dL (ref 70–99)
Potassium: 4.4 mmol/L (ref 3.5–5.3)
Sodium: 138 mmol/L (ref 135–146)
Total Protein: 7.2 g/dL (ref 6.1–8.1)

## 2015-07-14 LAB — HEMOGLOBIN A1C
HEMOGLOBIN A1C: 5.5 % (ref <5.7)
MEAN PLASMA GLUCOSE: 111 mg/dL (ref <117)

## 2015-07-14 MED ORDER — HYDROCODONE-ACETAMINOPHEN 7.5-325 MG PO TABS: 1.0000 | ORAL_TABLET | Freq: Four times a day (QID) | ORAL | Status: DC | PRN

## 2015-07-14 NOTE — Assessment & Plan Note (Signed)
Well controlled 

## 2015-07-14 NOTE — Patient Instructions (Signed)
Continue current medications We will call with lab results  F/U 4 months  

## 2015-07-14 NOTE — Assessment & Plan Note (Signed)
Check A1c her weight is improving

## 2015-07-14 NOTE — Assessment & Plan Note (Signed)
Pain medication refilled

## 2015-07-14 NOTE — Progress Notes (Signed)
Patient ID: [name], female   DOB: 11/21/1950, 64 y.o.   MRN: [id number] Subjective:   Patient presents for Medicare Annual/Subsequent preventive examination.  Patient here for wellness exam. She has no specific concerns today. Her medications are reviewed she did request a refill on her hydrocodone which she uses for her arthritis knee pain for pain. She still has had a CNA which helps her out throughout the week. Review Past Medical/Family/Social: Per EMR   Risk Factors  Current exercise habits: None  Dietary issues discussed: Yes  Cardiac risk factors: Obesity (BMI >= 30 kg/m2). HTn, glucose intolerance hyperlipidemia  Depression Screen --- current treatment for depression with trazodone (Note: if answer to either of the following is "Yes", a more complete depression screening is indicated)  Over the past two weeks, have you felt down, depressed or hopeless? No Over the past two weeks, have you felt little interest or pleasure in doing things? No Have you lost interest or pleasure in daily life? No Do you often feel hopeless? No Do you cry easily over simple problems? No   Activities of Daily Living  In your present state of health, do you have any difficulty performing the following activities?:  Driving? No  Managing money? No  Feeding yourself? No  Getting from bed to chair? No  Climbing a flight of stairs? yes  Preparing food and eating?:Yes Bathing or showering? No  Getting dressed: No  Getting to the toilet? No  Using the toilet:No  Moving around from place to place: No  In the past year have you fallen or had a near fall? yes Are you sexually active? No  Do you have more than one partner? No   Hearing Difficulties: No  Do you often ask people to speak up or repeat themselves? No  Do you experience ringing or noises in your ears? No Do you have difficulty understanding soft or whispered voices? No  Do you feel that you have a problem with memory? Yes Do you  often misplace items? No  Do you feel safe at home? Yes  Cognitive Testing  Alert? Yes Normal Appearance?Yes  Oriented to person? Yes Place? Yes  Time? Yes  Recall of three objects? Yes  Can perform simple calculations? Yes  Displays appropriate judgment?Yes  Can read the correct time from a watch face?Yes   List the Names of Other Physician/Practitioners you currently use:  Pulmonary   Screening Tests / Date Colonoscopy       UTD              Zostavax  - unable to afford  Mammogram  UTD Influenza Vaccine  UTD Tetanus/tdap UTD Pneumonia- UTD  ROS: GEN- denies fatigue, fever, weight loss,weakness, recent illness HEENT- denies eye drainage, change in vision, nasal discharge, CVS- denies chest pain, palpitations RESP- denies SOB, cough, wheeze ABD- denies N/V, change in stools, abd pain GU- denies dysuria, hematuria, dribbling, incontinence MSK- denies joint pain, muscle aches, injury Neuro- denies headache, dizziness, syncope, seizure activity  PHYSICAL: GEN- NAD, alert and oriented x3 HEENT- PERRL, EOMI, non injected sclera, pink conjunctiva, MMM, oropharynx clear Neck- Supple, no thryomegaly, no bruit  CVS- RRR, no murmur RESP-CTAB ABD-NABS, soft, NT,ND EXT- Trace pedal edema Pulses- Radial 2+   Assessment:    Annual wellness medicare exam   Plan:    During the course of the visit the patient was educated and counseled about appropriate screening and preventive services including:  .   CPE done- improved  weight loss, immunizations UTD, fasting labs today  Diet review for nutrition referral? Yes ____ Not Indicated __x__  Patient Instructions (the written plan) was given to the patient.  Medicare Attestation  I have personally reviewed:  The patient's medical and social history  Their use of alcohol, tobacco or illicit drugs  Their current medications and supplements  The patient's functional ability including ADLs,fall risks, home safety risks, cognitive,  and hearing and visual impairment  Diet and physical activities  Evidence for depression or mood disorders  The patient's weight, height, BMI, and visual acuity have been recorded in the chart. I have made referrals, counseling, and provided education to the patient based on review of the above and I have provided the patient with a written personalized care plan for preventive services.

## 2015-07-15 ENCOUNTER — Encounter: Payer: Self-pay | Admitting: *Deleted

## 2015-07-16 ENCOUNTER — Other Ambulatory Visit: Payer: Self-pay | Admitting: Family Medicine

## 2015-07-16 NOTE — Telephone Encounter (Signed)
Refill appropriate and filled per protocol. 

## 2015-08-03 ENCOUNTER — Emergency Department (HOSPITAL_COMMUNITY)
Admission: EM | Admit: 2015-08-03 | Discharge: 2015-08-03 | Disposition: A | Discharge status: Home / Self Care | Payer: Medicare HMO | Attending: Emergency Medicine | Admitting: Emergency Medicine

## 2015-08-03 ENCOUNTER — Encounter (HOSPITAL_COMMUNITY): Payer: Self-pay | Admitting: Emergency Medicine

## 2015-08-03 ENCOUNTER — Other Ambulatory Visit: Payer: Self-pay | Admitting: Family Medicine

## 2015-08-03 DIAGNOSIS — F329 Major depressive disorder, single episode, unspecified: Secondary | ICD-10-CM | POA: Diagnosis not present

## 2015-08-03 DIAGNOSIS — R000 Tachycardia, unspecified: Secondary | ICD-10-CM | POA: Insufficient documentation

## 2015-08-03 DIAGNOSIS — J45909 Unspecified asthma, uncomplicated: Secondary | ICD-10-CM | POA: Insufficient documentation

## 2015-08-03 DIAGNOSIS — K219 Gastro-esophageal reflux disease without esophagitis: Secondary | ICD-10-CM | POA: Insufficient documentation

## 2015-08-03 DIAGNOSIS — F419 Anxiety disorder, unspecified: Secondary | ICD-10-CM | POA: Insufficient documentation

## 2015-08-03 DIAGNOSIS — L02212 Cutaneous abscess of back [any part, except buttock]: Secondary | ICD-10-CM | POA: Insufficient documentation

## 2015-08-03 DIAGNOSIS — Z87891 Personal history of nicotine dependence: Secondary | ICD-10-CM | POA: Diagnosis not present

## 2015-08-03 DIAGNOSIS — I10 Essential (primary) hypertension: Secondary | ICD-10-CM | POA: Diagnosis not present

## 2015-08-03 DIAGNOSIS — M109 Gout, unspecified: Secondary | ICD-10-CM | POA: Diagnosis not present

## 2015-08-03 DIAGNOSIS — E662 Morbid (severe) obesity with alveolar hypoventilation: Secondary | ICD-10-CM | POA: Diagnosis not present

## 2015-08-03 DIAGNOSIS — Z79899 Other long term (current) drug therapy: Secondary | ICD-10-CM | POA: Diagnosis not present

## 2015-08-03 DIAGNOSIS — Z7982 Long term (current) use of aspirin: Secondary | ICD-10-CM | POA: Insufficient documentation

## 2015-08-03 DIAGNOSIS — M1990 Unspecified osteoarthritis, unspecified site: Secondary | ICD-10-CM | POA: Insufficient documentation

## 2015-08-03 DIAGNOSIS — E785 Hyperlipidemia, unspecified: Secondary | ICD-10-CM | POA: Insufficient documentation

## 2015-08-03 DIAGNOSIS — Z7952 Long term (current) use of systemic steroids: Secondary | ICD-10-CM | POA: Diagnosis not present

## 2015-08-03 MED ORDER — SULFAMETHOXAZOLE-TRIMETHOPRIM 800-160 MG PO TABS: 1.0000 | ORAL_TABLET | Freq: Two times a day (BID) | ORAL | Status: AC

## 2015-08-03 MED ORDER — LIDOCAINE-EPINEPHRINE (PF) 1 %-1:200000 IJ SOLN
10.0000 mL | Freq: Once | INTRAMUSCULAR | Status: AC
  Administered 2015-08-03: 10 mL via INTRADERMAL
  Filled 2015-08-03: qty 10

## 2015-08-03 MED ORDER — POVIDONE-IODINE 10 % EX SOLN
CUTANEOUS | Status: AC
  Filled 2015-08-03: qty 118

## 2015-08-03 NOTE — ED Notes (Signed)
Wound covered with nonadherent dressing and 4x4

## 2015-08-03 NOTE — ED Notes (Signed)
Pt reports abscess on left mid back x1year, abscess burst on Friday evening, pt unable to tell nature of pus.  Pt was to see Dr. Arnoldo Morale on 1/24 to be seen for this.  Pt alert and oriented.

## 2015-08-03 NOTE — ED Provider Notes (Signed)
CSN: [id number]     Arrival date & time 08/03/15  1011 History  By signing my name below, I, Emmanuella Mensah, attest that this documentation has been prepared under the direction and in the presence of Elnora Morrison, MD. Electronically Signed: Judithann Sauger, ED Scribe. 08/03/2015. 12:17 PM.    Chief Complaint  Patient presents with  . Abscess   The history is provided by the patient. No language interpreter was used.   HPI Comments: [name] is a 65 y.o. female who presents to the Emergency Department complaining of a persistent moderate burning area of swelling onset one year ago. She states that the area began draining 3 days ago but is not sure of the content of the drainage. No alleviating factors noted. She states that she has informed her PCP about these symptoms but nothing was done at that time. Pt denies any fever or chills.      Past Medical History  Diagnosis Date  . Obesity hypoventilation syndrome ([place])   . Obstructive sleep apnea   . Osteoarthritis   . Low back pain   . HTN (hypertension)   . Hyperlipidemia   . GERD (gastroesophageal reflux disease)   . Depression   . Anxiety   . Gout   . Achilles tendinitis   . Asthma    Past Surgical History  Procedure Laterality Date  . Tubal ligation    . Cardiac catheterization  2006  . Colonoscopy N/A 12/25/2013    Procedure: COLONOSCOPY;  Surgeon: Rogene Houston, MD;  Location: [facility];  Service: Endoscopy;  Laterality: N/A;  730-rescheduled Ann notified pt   Family History  Problem Relation Age of Onset  . Heart disease Mother   . Thyroid disease Mother   . Heart failure Father   . Lung disease Father   . Diabetes Brother   . Crohn's disease Daughter    Social History  Substance Use Topics  . Smoking status: Former Smoker -- 2.00 packs/day for 36 years    Types: Cigarettes    Start date: 06/25/1969    Quit date: 03/25/2005  . Smokeless tobacco: Never Used  . Alcohol Use: No   OB History     Gravida Para Term Preterm AB TAB SAB Ectopic Multiple Living   3 3 3       3      Review of Systems  Constitutional: Negative for fever and chills.  Skin:       Burning area of swelling  All other systems reviewed and are negative.     Allergies  Review of patient's allergies indicates no known allergies.  Home Medications   Prior to Admission medications   Medication Sig Start Date End Date Taking? Authorizing Provider  allopurinol (ZYLOPRIM) 100 MG tablet TAKE 1 TABLET BY MOUTH EVERY DAY 03/16/15  Yes Alycia Rossetti, MD  aspirin EC 325 MG tablet Take 325 mg by mouth daily.   Yes Historical Provider, MD  atorvastatin (LIPITOR) 10 MG tablet TAKE 1 TABLET BY MOUTH EVERY DAY 07/16/15  Yes Alycia Rossetti, MD  fluticasone (CUTIVATE) 0.05 % cream APPLY EXTERNALLY TO THE AFFECTED AREA DAILY 03/16/15  Yes Alycia Rossetti, MD  HYDROcodone-acetaminophen (NORCO) 7.5-325 MG tablet Take 1 tablet by mouth every 6 (six) hours as needed for moderate pain. 07/14/15  Yes Alycia Rossetti, MD  megestrol (MEGACE) 40 MG tablet Take 1 tablet (40 mg total) by mouth daily. 05/22/15  Yes Alycia Rossetti, MD  NON Mervyn Gay  at bedtime. CPAP and 1 lpm oxygen qhs   Yes Historical Provider, MD  ranitidine (ZANTAC) 150 MG tablet Take 1 tablet (150 mg total) by mouth at bedtime. 05/14/15  Yes Javier Glazier, MD  tiotropium (SPIRIVA HANDIHALER) 18 MCG inhalation capsule Place 1 capsule (18 mcg total) into inhaler and inhale daily. 11/12/14  Yes Elsie Stain, MD  traZODone (DESYREL) 50 MG tablet TAKE 1/2 TO 1 TABLET(25 TO 50 MG) BY MOUTH AT BEDTIME AS NEEDED FOR SLEEP 04/24/15  Yes Alycia Rossetti, MD  clotrimazole-betamethasone (LOTRISONE) cream Apply 1 application topically 2 (two) times daily as needed. To be applied to the feet as needed Patient not taking: Reported on 08/03/2015 02/26/14   Alycia Rossetti, MD  furosemide (LASIX) 40 MG tablet Take 1 tablet (40 mg total) by mouth daily. Patient not taking:  Reported on 08/03/2015 04/09/14   Alycia Rossetti, MD  naproxen (NAPROSYN) 500 MG tablet Take 1 tablet (500 mg total) by mouth 2 (two) times daily. Patient not taking: Reported on 08/03/2015 04/09/14   Alycia Rossetti, MD   BP 156/60 mmHg  Pulse 100  Temp(Src) 97.6 F (36.4 C) (Oral)  Resp 18  Wt 264 lb (119.75 kg)  SpO2 99% Physical Exam  Constitutional: She is oriented to person, place, and time. She appears well-developed and well-nourished. No distress.  HENT:  Head: Normocephalic and atraumatic.  Mouth/Throat: Oropharynx is clear and moist.  Eyes: Conjunctivae and EOM are normal.  Neck: Neck supple. No tracheal deviation present.  Cardiovascular: Normal rate.   Mild tachycardia  Pulmonary/Chest: Effort normal. No respiratory distress.  Musculoskeletal: Normal range of motion.  Neurological: She is alert and oriented to person, place, and time.  Skin: Skin is warm and dry.  4 cm diameter and 2.5 cm deep swelling on left paraspinal lower thoracic region, tender, mild warmth, no significant swelling, mild induration  Psychiatric: She has a normal mood and affect. Her behavior is normal.  Nursing note and vitals reviewed.   ED Course  Procedures (including critical care time) EMERGENCY DEPARTMENT US SOFT TISSUE INTERPRETATION "Study: Limited Soft Tissue Ultrasound"  INDICATIONS: Pain and Soft tissue infection Multiple views of the body part were obtained in real-time with a multi-frequency linear probe PERFORMED BY:  Myself IMAGES ARCHIVED?: Yes SIDE:Left BODY PART:Upper back FINDINGS: Abcess present INTERPRETATION:  Abcess present   CPT: Neck [id number]  Upper extremity [id number]  Axilla [id number]  Chest wall [id number]  Beast [id number]  Upper back [id number]  Lower back [id number]  Abdominal wall [id number]  Pelvic wall [id number]  Lower extremity [id number]  Other soft tissue [id number]   DIAGNOSTIC STUDIES: Oxygen Saturation is 99% on RA, normal by my  interpretation.    COORDINATION OF CARE: 10:24 AM- Pt advised of plan for treatment and pt agrees. Pt will receive an I&D for the abscess on her back.   INCISION AND DRAINAGE PROCEDURE NOTE: Patient identification was confirmed and verbal consent was obtained. This procedure was performed by Elnora Morrison, MD at 12:00 PM. Site: left paraspinal lower thoracic region Sterile procedures observed Needle size: 18 gauge Anesthetic used (type and amt): 1% Xylocaine  Blade size: 11 Drainage: 10 cc Complexity: Complex No packing used Site anesthetized, incision made over site, wound drained and explored loculations, rinsed with copious amounts of normal saline, wound packed with sterile gauze, covered with dry, sterile dressing.  Pt tolerated procedure well without complications.  Instructions for care discussed verbally and pt provided with additional written instructions for  homecare and f/u.   MDM   Final diagnoses:  None    Clinical abscess, ultrasound performed to confirm size. Incision and drainage without difficulty significant. On drainage. Discussed close follow-up outpatient and reasons to return.  Results and differential diagnosis were discussed with the patient/parent/guardian. Xrays were independently reviewed by myself.  Close follow up outpatient was discussed, comfortable with the plan.   Medications  povidone-iodine (BETADINE) 10 % external solution (not administered)  lidocaine-EPINEPHrine (XYLOCAINE-EPINEPHrine) 1 %-1:200000 (PF) injection 10 mL (10 mLs Intradermal Given by Other 08/03/15 1227)    Filed Vitals:   08/03/15 1020 08/03/15 1229  BP: 156/60 167/73  Pulse: 100 79  Temp: 97.6 F (36.4 C) 98.3 F (36.8 C)  TempSrc: Oral Oral  Resp: 18 16  Weight: 264 lb (119.75 kg)   SpO2: 99% 98%    Final diagnoses:  Back abscess    \  Elnora Morrison, MD 08/03/15 1254

## 2015-08-03 NOTE — Discharge Instructions (Signed)
Soak your abscess once to twice daily. Take home pain medicines as needed. Start taking Bactrim antibiotic if he develop redness, worsening pain, fevers or other concerns.  If you were given medicines take as directed.  If you are on coumadin or contraceptives realize their levels and effectiveness is altered by many different medicines.  If you have any reaction (rash, tongues swelling, other) to the medicines stop taking and see a physician.    If your blood pressure was elevated in the ER make sure you follow up for management with a primary doctor or return for chest pain, shortness of breath or stroke symptoms.  Please follow up as directed and return to the ER or see a physician for new or worsening symptoms.  Thank you. Filed Vitals:   08/03/15 1020 08/03/15 1229  BP: 156/60 167/73  Pulse: 100 79  Temp: 97.6 F (36.4 C) 98.3 F (36.8 C)  TempSrc: Oral Oral  Resp: 18 16  Weight: 264 lb (119.75 kg)   SpO2: 99% 98%

## 2015-08-03 NOTE — Telephone Encounter (Signed)
Ok to refill?  Of note, patient also has Norco.

## 2015-08-03 NOTE — Telephone Encounter (Signed)
okay

## 2015-08-03 NOTE — Telephone Encounter (Signed)
Prescription sent to pharmacy.

## 2015-08-07 ENCOUNTER — Encounter (HOSPITAL_COMMUNITY): Payer: Medicare HMO

## 2015-08-10 ENCOUNTER — Ambulatory Visit (HOSPITAL_COMMUNITY)
Admission: RE | Admit: 2015-08-10 | Discharge: 2015-08-10 | Disposition: A | Discharge status: Home / Self Care | Payer: Medicare HMO | Source: Ambulatory Visit | Attending: Pulmonary Disease | Admitting: Pulmonary Disease

## 2015-08-10 DIAGNOSIS — J45909 Unspecified asthma, uncomplicated: Secondary | ICD-10-CM | POA: Diagnosis present

## 2015-08-10 LAB — PULMONARY FUNCTION TEST
DL/VA % PRED: 102 %
DL/VA: 5.04 ml/min/mmHg/L
DLCO unc % pred: 37 %
DLCO unc: 9.71 ml/min/mmHg
FEF 25-75 POST: 1.49 L/s
FEF 25-75 Pre: 2.05 L/sec
FEF2575-%CHANGE-POST: -27 %
FEF2575-%PRED-POST: 78 %
FEF2575-%Pred-Pre: 107 %
FEV1-%Change-Post: -7 %
FEV1-%PRED-PRE: 76 %
FEV1-%Pred-Post: 70 %
FEV1-PRE: 1.55 L
FEV1-Post: 1.44 L
FEV1FVC-%CHANGE-POST: 0 %
FEV1FVC-%PRED-PRE: 111 %
FEV6-%CHANGE-POST: -6 %
FEV6-%Pred-Post: 66 %
FEV6-%Pred-Pre: 71 %
FEV6-Post: 1.66 L
FEV6-Pre: 1.78 L
FEV6FVC-%Pred-Post: 103 %
FEV6FVC-%Pred-Pre: 103 %
FVC-%Change-Post: -6 %
FVC-%PRED-POST: 63 %
FVC-%PRED-PRE: 68 %
FVC-POST: 1.66 L
FVC-PRE: 1.78 L
POST FEV1/FVC RATIO: 86 %
POST FEV6/FVC RATIO: 100 %
PRE FEV1/FVC RATIO: 87 %
Pre FEV6/FVC Ratio: 100 %
RV % pred: 81 %
RV: 1.76 L
TLC % pred: 72 %
TLC: 3.76 L

## 2015-08-10 MED ORDER — ALBUTEROL SULFATE (2.5 MG/3ML) 0.083% IN NEBU
2.5000 mg | INHALATION_SOLUTION | Freq: Once | RESPIRATORY_TRACT | Status: AC
  Administered 2015-08-10: 2.5 mg via RESPIRATORY_TRACT

## 2015-08-11 ENCOUNTER — Encounter: Payer: Self-pay | Admitting: Pulmonary Disease

## 2015-08-11 ENCOUNTER — Ambulatory Visit (INDEPENDENT_AMBULATORY_CARE_PROVIDER_SITE_OTHER): Payer: Medicare HMO | Admitting: Pulmonary Disease

## 2015-08-11 VITALS — BP 122/70 | HR 77 | Ht 65.0 in | Wt 264.0 lb

## 2015-08-11 DIAGNOSIS — K219 Gastro-esophageal reflux disease without esophagitis: Secondary | ICD-10-CM

## 2015-08-11 DIAGNOSIS — J984 Other disorders of lung: Secondary | ICD-10-CM

## 2015-08-11 DIAGNOSIS — Z9989 Dependence on other enabling machines and devices: Secondary | ICD-10-CM

## 2015-08-11 DIAGNOSIS — R0600 Dyspnea, unspecified: Secondary | ICD-10-CM | POA: Diagnosis not present

## 2015-08-11 DIAGNOSIS — G4733 Obstructive sleep apnea (adult) (pediatric): Secondary | ICD-10-CM

## 2015-08-11 DIAGNOSIS — J4520 Mild intermittent asthma, uncomplicated: Secondary | ICD-10-CM

## 2015-08-11 MED ORDER — ALBUTEROL SULFATE HFA 108 (90 BASE) MCG/ACT IN AERS: 2.0000 | INHALATION_SPRAY | RESPIRATORY_TRACT | Status: DC | PRN

## 2015-08-11 MED ORDER — FLUTICASONE PROPIONATE 50 MCG/ACT NA SUSP: 1.0000 | Freq: Two times a day (BID) | NASAL | Status: DC

## 2015-08-11 NOTE — Patient Instructions (Signed)
1. You can stop using Spiriva. 2. We are sending you in a prescription for an albuterol inhaler to use as needed for any coughing or wheezing. 3. Continue using your other medications including Zantac (Ranitidine) as prescribed. 4. I'm starting you on Flonase to help with your sinus drainage & congestion. Remember to angle the tip away form the bridge of your nose to prevent nose bleeds. 5. We will do breathing tests at your next appointment to monitor your asthma. 6. I will see you back in 3 months but please call if you have any new breathing problems before then.  TESTS ORDERED: 1. Spirometry with bronchodilator challenge at next appointment

## 2015-08-11 NOTE — Progress Notes (Signed)
Subjective:    Patient ID: [name], female    DOB: 1951-02-06, 65 y.o.   MRN: [id number]  C.C.:  Follow-up for Asthma, Restrictive Lung Disease, OSA/OHS, & GERD.  HPI Asthma: Mild cough at present due to mild sinus drainage. Denies any wheezing. Currently is using Spiriva alone. Denies any rescue inhaler at home. She does wake up at night with some coughing but again seems to be due to her sinus drainage. She reports no change in her baseline dyspnea. No exacerbations since last appointment.   Restrictive Lung Disease: Mild based on lung volumes. Likely secondary to obesity. No evidence of desaturation on 6 minute walk test today.  OSA/OHS:  Currently on CPAP therapy. She is using nasal pillows. She is religiously using her device but reports it seems to be making her congestion worse.  GERD:  Started on Zantac at last appointment after counseling. Reports her reflux has improved significantly. No morning brash water taste.  Review of Systems  She denies any sinus pressure or pain. Denies any fever or sweats. Rare, mild chill. No chest pain or pressure.   No Known Allergies  Current Outpatient Prescriptions on File Prior to Visit  Medication Sig Dispense Refill  . allopurinol (ZYLOPRIM) 100 MG tablet TAKE 1 TABLET BY MOUTH EVERY DAY 30 tablet 0  . aspirin EC 325 MG tablet Take 325 mg by mouth daily.    Marland Kitchen atorvastatin (LIPITOR) 10 MG tablet TAKE 1 TABLET BY MOUTH EVERY DAY 90 tablet 3  . clotrimazole-betamethasone (LOTRISONE) cream Apply 1 application topically 2 (two) times daily as needed. To be applied to the feet as needed 45 g 3  . fluticasone (CUTIVATE) 0.05 % cream APPLY EXTERNALLY TO THE AFFECTED AREA DAILY 30 g 3  . HYDROcodone-acetaminophen (NORCO) 7.5-325 MG tablet Take 1 tablet by mouth every 6 (six) hours as needed for moderate pain. 45 tablet 0  . megestrol (MEGACE) 40 MG tablet Take 1 tablet (40 mg total) by mouth daily. 30 tablet 11  . naproxen (NAPROSYN) 500 MG  tablet TAKE 1 TABLET BY MOUTH TWICE DAILY (Patient taking differently: TAKE 1 TABLET BY MOUTH TWICE DAILY as needed) 60 tablet 0  . NON FORMULARY at bedtime. CPAP and 1 lpm oxygen qhs    . ranitidine (ZANTAC) 150 MG tablet Take 1 tablet (150 mg total) by mouth at bedtime. 30 tablet 3  . tiotropium (SPIRIVA HANDIHALER) 18 MCG inhalation capsule Place 1 capsule (18 mcg total) into inhaler and inhale daily. 30 capsule 11  . traZODone (DESYREL) 50 MG tablet TAKE 1/2 TO 1 TABLET(25 TO 50 MG) BY MOUTH AT BEDTIME AS NEEDED FOR SLEEP 90 tablet 3   No current facility-administered medications on file prior to visit.    Past Medical History  Diagnosis Date  . Obesity hypoventilation syndrome ([place])   . Obstructive sleep apnea   . Osteoarthritis   . Low back pain   . HTN (hypertension)   . Hyperlipidemia   . GERD (gastroesophageal reflux disease)   . Depression   . Anxiety   . Gout   . Achilles tendinitis   . Asthma     Past Surgical History  Procedure Laterality Date  . Tubal ligation    . Cardiac catheterization  2006  . Colonoscopy N/A 12/25/2013    Procedure: COLONOSCOPY;  Surgeon: Rogene Houston, MD;  Location: [facility];  Service: Endoscopy;  Laterality: N/A;  730-rescheduled Ann notified pt    Family History  Problem Relation  Age of Onset  . Heart disease Mother   . Thyroid disease Mother   . Heart failure Father   . Lung disease Father   . Diabetes Brother   . Crohn's disease Daughter     Social History   Social History  . Marital Status: Widowed    Spouse Name: N/A  . Number of Children: N/A  . Years of Education: N/A   Occupational History  . disabled    Social History Main Topics  . Smoking status: Former Smoker -- 2.00 packs/day for 36 years    Types: Cigarettes    Start date: 06/25/1969    Quit date: 03/25/2005  . Smokeless tobacco: Never Used  . Alcohol Use: No  . Drug Use: No  . Sexual Activity: No   Other Topics Concern  . None   Social  History Narrative   Originally from [place]. She has always lived in [place]. 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 No pets currently. No bird, mold, or hot tub exposure.    Objective:   Physical Exam BP 122/70 mmHg  Pulse 77  Ht 5\' 5"  (1.651 m)  Wt 264 lb (119.75 kg)  BMI 43.93 kg/m2  SpO2 97% General:  Obese female. No distress.Awake. Integument: Warm and dry. No rash on exposed skin. Healing skin abscess with dressing on her back. HEENT:  Moist mucus membranes. No oral ulcers. Moderate bilateral nasal turbinate swelling left greater than right. Cardiovascular:  Regular rate & rhythm. No edema. Normal S1 & S2. Pulmonary:  Good aeration & clear to auscultation bilaterally. Symmetric chest wall expansion. No accessory muscle use on room air . Abdomen: Soft. Normal bowel sounds. Protuberant. Musculoskeletal:  Normal bulk and tone. No joint deformity or effusion appreciated.  PFT 08/10/15:  FVC 1.78 L (68%) FEV1 1.55 L (76%) FEV1/FVC 0.87 FEF 25-75 2.05 L (107%) negative bronchodilator response TLC 3.76 L (72%) RV 81% ERV 17% DLCO uncorrected 37% 04/10/13:  FVC 1.61 L (59%) FEV1 1.14 L (66%) FEV1/FVC 0.88 FEF 25-75 2.08 L (102%) positive bronchodilator response TLC 4.10 L (77%) RV 97% ERV 43% DLCO uncorrected 57%   6MWT 08/10/14:  Walked 247.5 meters / Baseline Sat 100% on RA / Nadir Sat 97% on RA  LABS 02/24/15 CBC: 8.0/12.5/36.9/278 BMP: 140/4.5/106/27/11/0.88/93/8.9 LFT: 3.6/6.7/0.7/88/12/26  05/12/14 ABG:  7.40 / 44 / 66 / 92%    Assessment & Plan:   65 year old with underlying mild,intermittent asthma.Symptomatically the patient seems to be well-controlled at this time. Her improvement in spirometry today and resolution of significant bronchodilator response are likely secondary to better control of her reflux. I believe it is reasonable to consider removing Spiriva from her inhaler regimen. I do feel she may benefit from nasal steroid therapy given  the turbinate swelling and problems with her nasal pillows for CPAP therapy.i instructed the patient to tact my office if she had any new breathing problems before her next appointment. In reviewing her lung volume tests today she continues to exhibit mild restriction without a significant desaturation on her 6 minute walk test likely owing to her central obesity.  1. Mild, intermittent asthma: Stopping Spiriva. Prescribing Albuterol inhaler to use as needed. Repeat spirometry with bronchodilator challenge at next appointment to ensure maximal bronchodilation.  2. Restrictive lung disease: Mild. Likely secondary to obesity. No further imaging or testing at this time. 3. OSA/OHS: Continuing on CPAP therapy indefinitely. Starting Flonase 1 spray bid to help with nasal turbinate swelling. 4. GERD: Well-controlled.  Continuing Zantac qhs. 5. Health maintenance: Patient received Prevnar & Influenza Vaccines 2016. 6. Follow-up: Patient will return to clinic in 3 months or sooner if needed.

## 2015-08-11 NOTE — Progress Notes (Signed)
PFT & walk test reviewed.

## 2015-08-17 ENCOUNTER — Telehealth: Payer: Self-pay | Admitting: Family Medicine

## 2015-08-17 MED ORDER — HYDROCODONE-ACETAMINOPHEN 7.5-325 MG PO TABS: 1.0000 | ORAL_TABLET | Freq: Four times a day (QID) | ORAL | Status: DC | PRN

## 2015-08-17 NOTE — Telephone Encounter (Signed)
Patient requesting refill on her HYDROcodone-acetaminophen [facility]) 7.5-325  Call back # [phone]

## 2015-08-17 NOTE — Telephone Encounter (Signed)
Okay to refill? 

## 2015-08-17 NOTE — Telephone Encounter (Signed)
Prescription printed and patient made aware to come to office to pick up on 08/18/2015. 

## 2015-08-17 NOTE — Telephone Encounter (Signed)
Ok to refill??  Last office visit/ refill 07/14/2015. 

## 2015-08-27 ENCOUNTER — Other Ambulatory Visit: Payer: Self-pay | Admitting: Family Medicine

## 2015-08-27 DIAGNOSIS — Z1231 Encounter for screening mammogram for malignant neoplasm of breast: Secondary | ICD-10-CM

## 2015-09-08 ENCOUNTER — Other Ambulatory Visit: Payer: Self-pay | Admitting: Family Medicine

## 2015-09-08 NOTE — Telephone Encounter (Signed)
Refill appropriate and filled per protocol. 

## 2015-09-10 NOTE — Patient Instructions (Signed)
Your procedure is scheduled on: 09/17/2015  Report to [facility] at   740  AM.  Call this number if you have problems the morning of surgery: [phone]   Do not eat food or drink liquids :After Midnight.      Take these medicines the morning of surgery with A SIP OF WATER: allopurinol, hydrocodone, zantac.   Do not wear jewelry, make-up or nail polish.  Do not wear lotions, powders, or perfumes. You may wear deodorant.  Do not shave 48 hours prior to surgery.  Do not bring valuables to the hospital.  Contacts, dentures or bridgework may not be worn into surgery.  Leave suitcase in the car. After surgery it may be brought to your room.  For patients admitted to the hospital, checkout time is 11:00 AM the day of discharge.   Patients discharged the day of surgery will not be allowed to drive home.  :     Please read over the following fact sheets that you were given: Coughing and Deep Breathing, Surgical Site Infection Prevention, Anesthesia Post-op Instructions and Care and Recovery After Surgery    Cataract A cataract is a clouding of the lens of the eye. When a lens becomes cloudy, vision is reduced based on the degree and nature of the clouding. Many cataracts reduce vision to some degree. Some cataracts make people more near-sighted as they develop. Other cataracts increase glare. Cataracts that are ignored and become worse can sometimes look white. The white color can be seen through the pupil. CAUSES   Aging. However, cataracts may occur at any age, even in newborns.   Certain drugs.   Trauma to the eye.   Certain diseases such as diabetes.   Specific eye diseases such as chronic inflammation inside the eye or a sudden attack of a rare form of glaucoma.   Inherited or acquired medical problems.  SYMPTOMS   Gradual, progressive drop in vision in the affected eye.   Severe, rapid visual loss. This most often happens when trauma is the cause.  DIAGNOSIS  To detect a  cataract, an eye doctor examines the lens. Cataracts are best diagnosed with an exam of the eyes with the pupils enlarged (dilated) by drops.  TREATMENT  For an early cataract, vision may improve by using different eyeglasses or stronger lighting. If that does not help your vision, surgery is the only effective treatment. A cataract needs to be surgically removed when vision loss interferes with your everyday activities, such as driving, reading, or watching TV. A cataract may also have to be removed if it prevents examination or treatment of another eye problem. Surgery removes the cloudy lens and usually replaces it with a substitute lens (intraocular lens, IOL).  At a time when both you and your doctor agree, the cataract will be surgically removed. If you have cataracts in both eyes, only one is usually removed at a time. This allows the operated eye to heal and be out of danger from any possible problems after surgery (such as infection or poor wound healing). In rare cases, a cataract may be doing damage to your eye. In these cases, your caregiver may advise surgical removal right away. The vast majority of people who have cataract surgery have better vision afterward. HOME CARE INSTRUCTIONS  If you are not planning surgery, you may be asked to do the following:  Use different eyeglasses.   Use stronger or brighter lighting.   Ask your eye doctor about reducing your  medicine dose or changing medicines if it is thought that a medicine caused your cataract. Changing medicines does not make the cataract go away on its own.   Become familiar with your surroundings. Poor vision can lead to injury. Avoid bumping into things on the affected side. You are at a higher risk for tripping or falling.   Exercise extreme care when driving or operating machinery.   Wear sunglasses if you are sensitive to bright light or experiencing problems with glare.  SEEK IMMEDIATE MEDICAL CARE IF:   You have a  worsening or sudden vision loss.   You notice redness, swelling, or increasing pain in the eye.   You have a fever.  Document Released: 07/11/2005 Document Revised: 06/30/2011 Document Reviewed: 03/04/2011 [facility] Patient Information 2012 [place].PATIENT INSTRUCTIONS POST-ANESTHESIA  IMMEDIATELY FOLLOWING SURGERY:  Do not drive or operate machinery for the first twenty four hours after surgery.  Do not make any important decisions for twenty four hours after surgery or while taking narcotic pain medications or sedatives.  If you develop intractable nausea and vomiting or a severe headache please notify your doctor immediately.  FOLLOW-UP:  Please make an appointment with your surgeon as instructed. You do not need to follow up with anesthesia unless specifically instructed to do so.  WOUND CARE INSTRUCTIONS (if applicable):  Keep a dry clean dressing on the anesthesia/puncture wound site if there is drainage.  Once the wound has quit draining you may leave it open to air.  Generally you should leave the bandage intact for twenty four hours unless there is drainage.  If the epidural site drains for more than 36-48 hours please call the anesthesia department.  QUESTIONS?:  Please feel free to call your physician or the hospital operator if you have any questions, and they will be happy to assist you.

## 2015-09-11 ENCOUNTER — Other Ambulatory Visit: Payer: Self-pay | Admitting: *Deleted

## 2015-09-11 MED ORDER — RANITIDINE HCL 150 MG PO TABS: 150.0000 mg | ORAL_TABLET | Freq: Every day | ORAL | Status: DC

## 2015-09-14 ENCOUNTER — Encounter (HOSPITAL_COMMUNITY)
Admission: RE | Admit: 2015-09-14 | Discharge: 2015-09-14 | Disposition: A | Discharge status: Home / Self Care | Payer: Medicare HMO | Source: Ambulatory Visit | Attending: Ophthalmology | Admitting: Ophthalmology

## 2015-09-14 ENCOUNTER — Encounter (HOSPITAL_COMMUNITY): Payer: Self-pay

## 2015-09-14 ENCOUNTER — Other Ambulatory Visit: Payer: Self-pay

## 2015-09-14 DIAGNOSIS — Z791 Long term (current) use of non-steroidal anti-inflammatories (NSAID): Secondary | ICD-10-CM | POA: Diagnosis not present

## 2015-09-14 DIAGNOSIS — Z01810 Encounter for preprocedural cardiovascular examination: Secondary | ICD-10-CM | POA: Diagnosis not present

## 2015-09-14 DIAGNOSIS — M1991 Primary osteoarthritis, unspecified site: Secondary | ICD-10-CM | POA: Diagnosis not present

## 2015-09-14 DIAGNOSIS — Z7982 Long term (current) use of aspirin: Secondary | ICD-10-CM | POA: Diagnosis not present

## 2015-09-14 DIAGNOSIS — R0681 Apnea, not elsewhere classified: Secondary | ICD-10-CM | POA: Diagnosis not present

## 2015-09-14 DIAGNOSIS — Z7951 Long term (current) use of inhaled steroids: Secondary | ICD-10-CM | POA: Diagnosis not present

## 2015-09-14 DIAGNOSIS — Z6841 Body Mass Index (BMI) 40.0 and over, adult: Secondary | ICD-10-CM | POA: Diagnosis not present

## 2015-09-14 DIAGNOSIS — Z79899 Other long term (current) drug therapy: Secondary | ICD-10-CM | POA: Diagnosis not present

## 2015-09-14 DIAGNOSIS — K219 Gastro-esophageal reflux disease without esophagitis: Secondary | ICD-10-CM | POA: Diagnosis not present

## 2015-09-14 DIAGNOSIS — F418 Other specified anxiety disorders: Secondary | ICD-10-CM | POA: Diagnosis not present

## 2015-09-14 DIAGNOSIS — H25812 Combined forms of age-related cataract, left eye: Secondary | ICD-10-CM | POA: Diagnosis present

## 2015-09-14 DIAGNOSIS — J449 Chronic obstructive pulmonary disease, unspecified: Secondary | ICD-10-CM | POA: Diagnosis not present

## 2015-09-14 DIAGNOSIS — I10 Essential (primary) hypertension: Secondary | ICD-10-CM | POA: Diagnosis not present

## 2015-09-14 NOTE — Pre-Procedure Instructions (Signed)
Patient given information to sign up for my chart at home. 

## 2015-09-16 MED ORDER — CYCLOPENTOLATE-PHENYLEPHRINE OP SOLN OPTIME - NO CHARGE
OPHTHALMIC | Status: AC
  Filled 2015-09-16: qty 2

## 2015-09-16 MED ORDER — LIDOCAINE HCL (PF) 1 % IJ SOLN
INTRAMUSCULAR | Status: AC
  Filled 2015-09-16: qty 2

## 2015-09-16 MED ORDER — PHENYLEPHRINE HCL 2.5 % OP SOLN
OPHTHALMIC | Status: AC
  Filled 2015-09-16: qty 15

## 2015-09-16 MED ORDER — LIDOCAINE HCL 3.5 % OP GEL
OPHTHALMIC | Status: AC
  Filled 2015-09-16: qty 1

## 2015-09-16 MED ORDER — TETRACAINE HCL 0.5 % OP SOLN
OPHTHALMIC | Status: AC
  Filled 2015-09-16: qty 4

## 2015-09-16 MED ORDER — NEOMYCIN-POLYMYXIN-DEXAMETH 3.5-10000-0.1 OP SUSP
OPHTHALMIC | Status: AC
  Filled 2015-09-16: qty 5

## 2015-09-17 ENCOUNTER — Encounter (HOSPITAL_COMMUNITY): Payer: Self-pay | Admitting: *Deleted

## 2015-09-17 ENCOUNTER — Telehealth: Payer: Self-pay | Admitting: Family Medicine

## 2015-09-17 ENCOUNTER — Ambulatory Visit (HOSPITAL_COMMUNITY): Payer: Medicare HMO | Admitting: Anesthesiology

## 2015-09-17 ENCOUNTER — Ambulatory Visit (HOSPITAL_COMMUNITY)
Admission: RE | Admit: 2015-09-17 | Discharge: 2015-09-17 | Disposition: A | Discharge status: Home / Self Care | Payer: Medicare HMO | Source: Ambulatory Visit | Attending: Ophthalmology | Admitting: Ophthalmology

## 2015-09-17 ENCOUNTER — Encounter (HOSPITAL_COMMUNITY)
Admission: RE | Disposition: A | Discharge status: Home / Self Care | Payer: Self-pay | Source: Ambulatory Visit | Attending: Ophthalmology

## 2015-09-17 DIAGNOSIS — Z791 Long term (current) use of non-steroidal anti-inflammatories (NSAID): Secondary | ICD-10-CM | POA: Insufficient documentation

## 2015-09-17 DIAGNOSIS — Z7982 Long term (current) use of aspirin: Secondary | ICD-10-CM | POA: Insufficient documentation

## 2015-09-17 DIAGNOSIS — I10 Essential (primary) hypertension: Secondary | ICD-10-CM | POA: Diagnosis not present

## 2015-09-17 DIAGNOSIS — R0681 Apnea, not elsewhere classified: Secondary | ICD-10-CM | POA: Insufficient documentation

## 2015-09-17 DIAGNOSIS — M1991 Primary osteoarthritis, unspecified site: Secondary | ICD-10-CM | POA: Insufficient documentation

## 2015-09-17 DIAGNOSIS — J449 Chronic obstructive pulmonary disease, unspecified: Secondary | ICD-10-CM | POA: Diagnosis not present

## 2015-09-17 DIAGNOSIS — Z6841 Body Mass Index (BMI) 40.0 and over, adult: Secondary | ICD-10-CM | POA: Insufficient documentation

## 2015-09-17 DIAGNOSIS — Z01810 Encounter for preprocedural cardiovascular examination: Secondary | ICD-10-CM | POA: Diagnosis not present

## 2015-09-17 DIAGNOSIS — Z7951 Long term (current) use of inhaled steroids: Secondary | ICD-10-CM | POA: Insufficient documentation

## 2015-09-17 DIAGNOSIS — F418 Other specified anxiety disorders: Secondary | ICD-10-CM | POA: Insufficient documentation

## 2015-09-17 DIAGNOSIS — Z79899 Other long term (current) drug therapy: Secondary | ICD-10-CM | POA: Insufficient documentation

## 2015-09-17 DIAGNOSIS — H25812 Combined forms of age-related cataract, left eye: Secondary | ICD-10-CM | POA: Diagnosis not present

## 2015-09-17 DIAGNOSIS — K219 Gastro-esophageal reflux disease without esophagitis: Secondary | ICD-10-CM | POA: Insufficient documentation

## 2015-09-17 HISTORY — PX: CATARACT EXTRACTION W/PHACO: SHX586

## 2015-09-17 SURGERY — PHACOEMULSIFICATION, CATARACT, WITH IOL INSERTION
Anesthesia: Monitor Anesthesia Care | Site: Eye | Laterality: Left

## 2015-09-17 MED ORDER — FENTANYL CITRATE (PF) 100 MCG/2ML IJ SOLN
INTRAMUSCULAR | Status: AC
  Filled 2015-09-17: qty 2

## 2015-09-17 MED ORDER — EPINEPHRINE HCL 1 MG/ML IJ SOLN
INTRAMUSCULAR | Status: AC
  Filled 2015-09-17: qty 1

## 2015-09-17 MED ORDER — TETRACAINE HCL 0.5 % OP SOLN
1.0000 [drp] | OPHTHALMIC | Status: AC
  Administered 2015-09-17 (×3): 1 [drp] via OPHTHALMIC

## 2015-09-17 MED ORDER — CYCLOPENTOLATE-PHENYLEPHRINE 0.2-1 % OP SOLN
1.0000 [drp] | OPHTHALMIC | Status: AC
  Administered 2015-09-17 (×3): 1 [drp] via OPHTHALMIC

## 2015-09-17 MED ORDER — BSS IO SOLN
INTRAOCULAR | Status: DC | PRN
  Administered 2015-09-17: 15 mL

## 2015-09-17 MED ORDER — LACTATED RINGERS IV SOLN
INTRAVENOUS | Status: DC
  Administered 2015-09-17: 09:00:00 via INTRAVENOUS

## 2015-09-17 MED ORDER — NEOMYCIN-POLYMYXIN-DEXAMETH 3.5-10000-0.1 OP SUSP
OPHTHALMIC | Status: DC | PRN
  Administered 2015-09-17: 2 [drp] via OPHTHALMIC

## 2015-09-17 MED ORDER — POVIDONE-IODINE 5 % OP SOLN
OPHTHALMIC | Status: DC | PRN
  Administered 2015-09-17: 1 via OPHTHALMIC

## 2015-09-17 MED ORDER — FENTANYL CITRATE (PF) 100 MCG/2ML IJ SOLN
25.0000 ug | INTRAMUSCULAR | Status: AC
  Administered 2015-09-17 (×2): 25 ug via INTRAVENOUS

## 2015-09-17 MED ORDER — PHENYLEPHRINE HCL 2.5 % OP SOLN
1.0000 [drp] | OPHTHALMIC | Status: AC
Start: 2015-09-17 — End: 2015-09-17
  Administered 2015-09-17 (×3): 1 [drp] via OPHTHALMIC

## 2015-09-17 MED ORDER — EPINEPHRINE HCL 1 MG/ML IJ SOLN
INTRAOCULAR | Status: DC | PRN
  Administered 2015-09-17: 500 mL

## 2015-09-17 MED ORDER — LIDOCAINE HCL (PF) 1 % IJ SOLN
INTRAMUSCULAR | Status: DC | PRN
  Administered 2015-09-17: .6 mL

## 2015-09-17 MED ORDER — MIDAZOLAM HCL 2 MG/2ML IJ SOLN
INTRAMUSCULAR | Status: AC
Start: 2015-09-17 — End: 2015-09-17
  Filled 2015-09-17: qty 2

## 2015-09-17 MED ORDER — LIDOCAINE HCL 3.5 % OP GEL
1.0000 "application " | Freq: Once | OPHTHALMIC | Status: AC
  Administered 2015-09-17: 1 via OPHTHALMIC

## 2015-09-17 MED ORDER — PROVISC 10 MG/ML IO SOLN
INTRAOCULAR | Status: DC | PRN
  Administered 2015-09-17: 0.85 mL via INTRAOCULAR

## 2015-09-17 MED ORDER — MIDAZOLAM HCL 2 MG/2ML IJ SOLN
1.0000 mg | INTRAMUSCULAR | Status: DC | PRN
  Administered 2015-09-17: 2 mg via INTRAVENOUS

## 2015-09-17 SURGICAL SUPPLY — 14 items
CLOTH BEACON ORANGE TIMEOUT ST (SAFETY) ×2 IMPLANT
EYE SHIELD UNIVERSAL CLEAR (GAUZE/BANDAGES/DRESSINGS) ×2 IMPLANT
GLOVE BIOGEL PI IND STRL 6.5 (GLOVE) IMPLANT
GLOVE BIOGEL PI IND STRL 7.0 (GLOVE) IMPLANT
GLOVE BIOGEL PI INDICATOR 6.5 (GLOVE) ×2
GLOVE BIOGEL PI INDICATOR 7.0 (GLOVE) ×4
GOWN STRL REUS W/ TWL LRG LVL3 (GOWN DISPOSABLE) IMPLANT
GOWN STRL REUS W/TWL LRG LVL3 (GOWN DISPOSABLE) ×3
PAD ARMBOARD 7.5X6 YLW CONV (MISCELLANEOUS) ×2 IMPLANT
SIGHTPATH CAT PROC W REG LENS (Ophthalmic Related) ×3 IMPLANT
SYRINGE LUER LOK 1CC (MISCELLANEOUS) ×2 IMPLANT
TAPE SURG TRANSPORE 1 IN (GAUZE/BANDAGES/DRESSINGS) IMPLANT
TAPE SURGICAL TRANSPORE 1 IN (GAUZE/BANDAGES/DRESSINGS) ×2
WATER STERILE IRR 250ML POUR (IV SOLUTION) ×2 IMPLANT

## 2015-09-17 NOTE — Anesthesia Postprocedure Evaluation (Signed)
Anesthesia Post Note  Patient: [name]  Procedure(s) Performed: Procedure(s) (LRB): CATARACT EXTRACTION PHACO AND INTRAOCULAR LENS PLACEMENT LEFT EYE cde=8.58 (Left)  Patient location during evaluation: Short Stay Anesthesia Type: MAC Level of consciousness: awake and alert, oriented and patient cooperative Pain management: pain level controlled Vital Signs Assessment: post-procedure vital signs reviewed and stable Respiratory status: respiratory function stable Cardiovascular status: blood pressure returned to baseline Postop Assessment: no signs of nausea or vomiting Anesthetic complications: no    Last Vitals:  Filed Vitals:   09/17/15 0850 09/17/15 0855  BP: 140/69 154/67  Pulse:    Resp: 18 18    Last Pain: There were no vitals filed for this visit.               Dionte Blaustein J

## 2015-09-17 NOTE — H&P (Signed)
I have reviewed the H&P, the patient was re-examined, and I have identified no interval changes in medical condition and plan of care since the history and physical of record  

## 2015-09-17 NOTE — Transfer of Care (Signed)
Immediate Anesthesia Transfer of Care Note  Patient: [name]  Procedure(s) Performed: Procedure(s): CATARACT EXTRACTION PHACO AND INTRAOCULAR LENS PLACEMENT LEFT EYE cde=8.58 (Left)  Patient Location: Short Stay  Anesthesia Type:MAC  Level of Consciousness: awake, alert , oriented and patient cooperative  Airway & Oxygen Therapy: Patient Spontanous Breathing  Post-op Assessment: Report given to RN, Post -op Vital signs reviewed and stable and Patient moving all extremities  Post vital signs: Reviewed and stable  Last Vitals:  Filed Vitals:   09/17/15 0850 09/17/15 0855  BP: 140/69 154/67  Pulse:    Resp: 18 18    Complications: No apparent anesthesia complications

## 2015-09-17 NOTE — Telephone Encounter (Signed)
Pt is calling for a refill of Hydrocodone. [phone]

## 2015-09-17 NOTE — Op Note (Signed)
Date of Admission: 09/17/2015  Date of Surgery: 09/17/2015   Pre-Op Dx: Cataract Left Eye  Post-Op Dx: Senile Combined Cataract Left  Eye,  Dx Code [id number]  Surgeon: Tonny Branch, M.D.  Assistants: None  Anesthesia: Topical with MAC  Indications: Painless, progressive loss of vision with compromise of daily activities.  Surgery: Cataract Extraction with Intraocular lens Implant Left Eye  Discription: The patient had dilating drops and viscous lidocaine placed into the Left eye in the pre-op holding area. After transfer to the operating room, a time out was performed. The patient was then prepped and draped. Beginning with a 93 degree blade a paracentesis port was made at the surgeon's 2 o'clock position. The anterior chamber was then filled with 1% non-preserved lidocaine. This was followed by filling the anterior chamber with Provisc.  A 2.73mm keratome blade was used to make a clear corneal incision at the temporal limbus.  A bent cystatome needle was used to create a continuous tear capsulotomy. Hydrodissection was performed with balanced salt solution on a Fine canula. The lens nucleus was then removed using the phacoemulsification handpiece. Residual cortex was removed with the I&A handpiece. The anterior chamber and capsular bag were refilled with Provisc. A posterior chamber intraocular lens was placed into the capsular bag with it's injector. The implant was positioned with the Kuglan hook. The Provisc was then removed from the anterior chamber and capsular bag with the I&A handpiece. Stromal hydration of the main incision and paracentesis port was performed with BSS on a Fine canula. The wounds were tested for leak which was negative. The patient tolerated the procedure well. There were no operative complications. The patient was then transferred to the recovery room in stable condition.  Complications: None  Specimen: None  EBL: None  Prosthetic device: Hoya iSert 250, power 20.0 D, SN  [id number].

## 2015-09-17 NOTE — Anesthesia Preprocedure Evaluation (Signed)
Anesthesia Evaluation  Patient identified by MRN, date of birth, ID band Patient awake    Reviewed: Allergy & Precautions, H&P , NPO status , Patient's Chart, lab work & pertinent test results  Airway Mallampati: II  TM Distance: >3 FB Neck ROM: Full    Dental  (+) Edentulous Upper   Pulmonary asthma , sleep apnea , COPD, former smoker,    Pulmonary exam normal        Cardiovascular hypertension, Pt. on medications  Rhythm:Regular Rate:Normal     Neuro/Psych PSYCHIATRIC DISORDERS Anxiety Depression  Neuromuscular disease    GI/Hepatic Neg liver ROS, GERD  Medicated,  Endo/Other  Morbid obesity  Renal/GU negative Renal ROS     Musculoskeletal  (+) Arthritis , Osteoarthritis,    Abdominal (+) + obese,  Abdomen: soft.    Peds  Hematology negative hematology ROS (+)   Anesthesia Other Findings   Reproductive/Obstetrics                             Anesthesia Physical Anesthesia Plan  ASA: III  Anesthesia Plan: MAC   Post-op Pain Management:    Induction: Intravenous  Airway Management Planned: Nasal Cannula  Additional Equipment:   Intra-op Plan:   Post-operative Plan:   Informed Consent: I have reviewed the patients History and Physical, chart, labs and discussed the procedure including the risks, benefits and alternatives for the proposed anesthesia with the patient or authorized representative who has indicated his/her understanding and acceptance.     Plan Discussed with:   Anesthesia Plan Comments:         Anesthesia Quick Evaluation

## 2015-09-17 NOTE — Anesthesia Preprocedure Evaluation (Deleted)
Anesthesia Evaluation    Airway        Dental   Pulmonary former smoker,           Cardiovascular hypertension,      Neuro/Psych    GI/Hepatic   Endo/Other    Renal/GU      Musculoskeletal   Abdominal   Peds  Hematology   Anesthesia Other Findings   Reproductive/Obstetrics                             Anesthesia Physical Anesthesia Plan Anesthesia Quick Evaluation                                  Anesthesia Evaluation  Patient identified by MRN, date of birth, ID band Patient awake    Reviewed: Allergy & Precautions, H&P , NPO status , Patient's Chart, lab work & pertinent test results  Airway Mallampati: II TM Distance: >3 FB Neck ROM: Full    Dental  (+) Edentulous Upper   Pulmonary sleep apnea , COPD   Pulmonary exam normal       Cardiovascular hypertension, Pt. on medications Rhythm:Regular Rate:Normal     Neuro/Psych PSYCHIATRIC DISORDERS Anxiety Depression  Neuromuscular disease    GI/Hepatic Neg liver ROS, GERD-  Medicated,  Endo/Other  Morbid obesity  Renal/GU negative Renal ROS     Musculoskeletal  (+) Arthritis -, Osteoarthritis,    Abdominal (+) + obese,  Abdomen: soft.    Peds  Hematology negative hematology ROS (+)   Anesthesia Other Findings   Reproductive/Obstetrics                           Anesthesia Physical Anesthesia Plan  ASA: III  Anesthesia Plan: Bier Block   Post-op Pain Management:    Induction:   Airway Management Planned: Nasal Cannula  Additional Equipment:   Intra-op Plan:   Post-operative Plan:   Informed Consent: I have reviewed the patients History and Physical, chart, labs and discussed the procedure including the risks, benefits and alternatives for the proposed anesthesia with the patient or authorized representative who has indicated his/her understanding and acceptance.      Plan Discussed with: CRNA  Anesthesia Plan Comments:         Anesthesia Quick Evaluation

## 2015-09-17 NOTE — Discharge Instructions (Signed)

## 2015-09-18 ENCOUNTER — Encounter (HOSPITAL_COMMUNITY): Payer: Self-pay | Admitting: Ophthalmology

## 2015-09-18 MED ORDER — HYDROCODONE-ACETAMINOPHEN 7.5-325 MG PO TABS: 1.0000 | ORAL_TABLET | Freq: Four times a day (QID) | ORAL | Status: DC | PRN

## 2015-09-18 NOTE — Telephone Encounter (Signed)
Okay to refill? 

## 2015-09-18 NOTE — Telephone Encounter (Signed)
Ok to refill??  Last office visit 07/14/2015.  Last refill 08/17/2015.

## 2015-09-18 NOTE — Telephone Encounter (Signed)
Prescription printed and patient made aware to come to office to pick up after 2pm on 09/18/2015.

## 2015-09-28 ENCOUNTER — Encounter: Payer: Self-pay | Admitting: Family Medicine

## 2015-09-28 ENCOUNTER — Ambulatory Visit (INDEPENDENT_AMBULATORY_CARE_PROVIDER_SITE_OTHER): Payer: Medicare HMO | Admitting: Family Medicine

## 2015-09-28 VITALS — BP 148/68 | HR 78 | Temp 98.7°F | Resp 16 | Ht 65.0 in | Wt 262.0 lb

## 2015-09-28 DIAGNOSIS — E785 Hyperlipidemia, unspecified: Secondary | ICD-10-CM | POA: Diagnosis not present

## 2015-09-28 DIAGNOSIS — I10 Essential (primary) hypertension: Secondary | ICD-10-CM | POA: Diagnosis not present

## 2015-09-28 MED ORDER — AMLODIPINE BESYLATE 5 MG PO TABS: 5.0000 mg | ORAL_TABLET | Freq: Every day | ORAL | Status: DC

## 2015-09-28 NOTE — Assessment & Plan Note (Signed)
Review last set of labs the patient at bedside cholesterol is at goal

## 2015-09-28 NOTE — Progress Notes (Signed)
Patient ID: [name], female   DOB: 08-Sep-1950, 65 y.o.   MRN: [id number]    Subjective:    Patient ID: [name], female    DOB: May 03, 1951, 65 y.o.   MRN: [id number]  Patient presents for 4 month F/U  Patient for follow-up on her chronic medical problems. She's no particular concerns today. I noted that her blood pressures have been increasing between my last office visit in her specialist visits. She's not on any blood pressure medication. She still takes her pain medication as needed for her arthritis and her chronic foot issues.  She was seen by pulmonary for her restrictive lung disease and asthma bronchitis she has set up to see have another pulmonary function test next month she was also taken off of Spiriva so she only has albuterol as needed.  Review Of Systems:  GEN- denies fatigue, fever, weight loss,weakness, recent illness HEENT- denies eye drainage, change in vision, nasal discharge, CVS- denies chest pain, palpitations RESP- denies SOB, cough, wheeze ABD- denies N/V, change in stools, abd pain GU- denies dysuria, hematuria, dribbling, incontinence MSK-+jjoint pain, muscle aches, injury Neuro- denies headache, dizziness, syncope, seizure activity       Objective:    BP 148/68 mmHg  Pulse 78  Temp(Src) 98.7 F (37.1 C) (Oral)  Resp 16  Ht 5\' 5"  (1.651 m)  Wt 262 lb (118.842 kg)  BMI 43.60 kg/m2 GEN- NAD, alert and oriented x3 HEENT- PERRL, EOMI, non injected sclera, pink conjunctiva, MMM, oropharynx clear Neck- Supple, no thyromegaly CVS- RRR, no murmur RESP-CTAB EXT- No edema Pulses- Radial, DP- 2+        Assessment & Plan:      Problem List Items Addressed This Visit    Morbid obesity ([place])   Hyperlipidemia    Review last set of labs the patient at bedside cholesterol is at goal      Relevant Medications   amLODipine (NORVASC) 5 MG tablet   Essential hypertension - Primary    She had a recent fasting labs which looked good. Her  blood pressure continues to be elevated. Because of her gout we will IUs hydrochlorothiazide. In the past she also had cough with ACE inhibitor. Will put her on amlodipine 5 mg once a day she will follow-up in one month for blood pressure recheck      Relevant Medications   amLODipine (NORVASC) 5 MG tablet      Note: This dictation was prepared with Dragon dictation along with smaller phrase technology. Any transcriptional errors that result from this process are unintentional.

## 2015-09-28 NOTE — Assessment & Plan Note (Signed)
She had a recent fasting labs which looked good. Her blood pressure continues to be elevated. Because of her gout we will IUs hydrochlorothiazide. In the past she also had cough with ACE inhibitor. Will put her on amlodipine 5 mg once a day she will follow-up in one month for blood pressure recheck

## 2015-09-28 NOTE — Patient Instructions (Signed)
Start the blood pressure pill- amlodipine once a day  Continue current medications F/U 4 weeks for blood pressure check

## 2015-10-01 ENCOUNTER — Ambulatory Visit (HOSPITAL_COMMUNITY)
Admission: RE | Admit: 2015-10-01 | Discharge: 2015-10-01 | Disposition: A | Discharge status: Home / Self Care | Payer: Medicare HMO | Source: Ambulatory Visit | Attending: Family Medicine | Admitting: Family Medicine

## 2015-10-01 DIAGNOSIS — Z1231 Encounter for screening mammogram for malignant neoplasm of breast: Secondary | ICD-10-CM | POA: Diagnosis present

## 2015-10-16 ENCOUNTER — Telehealth: Payer: Self-pay | Admitting: Family Medicine

## 2015-10-16 MED ORDER — HYDROCODONE-ACETAMINOPHEN 7.5-325 MG PO TABS: 1.0000 | ORAL_TABLET | Freq: Four times a day (QID) | ORAL | Status: DC | PRN

## 2015-10-16 NOTE — Telephone Encounter (Signed)
Okay to refill? 

## 2015-10-16 NOTE — Telephone Encounter (Signed)
Prescription request HYDROcodone-acetaminophen (NORCO) 7.5-325 MG tablet  CB# [phone]

## 2015-10-16 NOTE — Telephone Encounter (Signed)
Prescription printed and patient made aware to come to office to pick up after 2 pm on 10/16/2015.

## 2015-10-16 NOTE — Telephone Encounter (Signed)
Ok to refill??  Last office visit 09/28/2015.  Last refill 09/18/2015.

## 2015-10-26 ENCOUNTER — Encounter: Payer: Self-pay | Admitting: Family Medicine

## 2015-10-26 ENCOUNTER — Ambulatory Visit (INDEPENDENT_AMBULATORY_CARE_PROVIDER_SITE_OTHER): Payer: Medicare HMO | Admitting: Family Medicine

## 2015-10-26 VITALS — BP 142/86 | HR 80 | Temp 98.2°F | Resp 16 | Ht 65.0 in | Wt 262.0 lb

## 2015-10-26 DIAGNOSIS — I10 Essential (primary) hypertension: Secondary | ICD-10-CM

## 2015-10-26 MED ORDER — AMLODIPINE BESYLATE 10 MG PO TABS: 10.0000 mg | ORAL_TABLET | Freq: Every day | ORAL | Status: DC

## 2015-10-26 MED ORDER — RANITIDINE HCL 150 MG PO TABS: 150.0000 mg | ORAL_TABLET | Freq: Every day | ORAL | Status: DC

## 2015-10-26 MED ORDER — ALLOPURINOL 100 MG PO TABS: 100.0000 mg | ORAL_TABLET | Freq: Every day | ORAL | Status: DC

## 2015-10-26 NOTE — Assessment & Plan Note (Signed)
BP still elevated, on recheck 148/90 Increase norvasc to 10mg  once a day

## 2015-10-26 NOTE — Progress Notes (Signed)
Patient ID: [name], female   DOB: 01-13-1951, 65 y.o.   MRN: [id number]   Subjective:    Patient ID: [name], female    DOB: [birth year], 65 y.o.   MRN: [id number]  Patient presents for 4 week F/U  Pt here for intermin F/U on BP, last visit started on norvasc 5mg  once a day . No SE of medication, feels well no concerns. Has not checked BP at home     Review Of Systems:  GEN- denies fatigue, fever, weight loss,weakness, recent illness HEENT- denies eye drainage, change in vision, nasal discharge, CVS- denies chest pain, palpitations RESP- denies SOB, cough, wheeze ABD- denies N/V, change in stools, abd pain Neuro- denies headache, dizziness, syncope, seizure activity       Objective:    BP 142/86 mmHg  Pulse 80  Temp(Src) 98.2 F (36.8 C) (Oral)  Resp 16  Ht 5\' 5"  (1.651 m)  Wt 262 lb (118.842 kg)  BMI 43.60 kg/m2 GEN- NAD, alert and oriented x3 CVS- RRR, no murmur RESP-CTAB EXT- No edema Pulses- Radial - 2+        Assessment & Plan:      Problem List Items Addressed This Visit    Essential hypertension - Primary    BP still elevated, on recheck 148/90 Increase norvasc to 10mg  once a day       Relevant Medications   amLODipine (NORVASC) 10 MG tablet      Note: This dictation was prepared with Dragon dictation along with smaller phrase technology. Any transcriptional errors that result from this process are unintentional.

## 2015-10-26 NOTE — Patient Instructions (Addendum)
Take 2 of your 5mg  amlodipine for your blood pressure, then get then new prescription Continue all other medications F/U 3 months

## 2015-11-17 ENCOUNTER — Telehealth: Payer: Self-pay | Admitting: Family Medicine

## 2015-11-17 MED ORDER — HYDROCODONE-ACETAMINOPHEN 7.5-325 MG PO TABS: 1.0000 | ORAL_TABLET | Freq: Four times a day (QID) | ORAL | Status: DC | PRN

## 2015-11-17 NOTE — Telephone Encounter (Signed)
Prescription printed and patient made aware to come to office to pick up after 4pm on 11/17/2015.

## 2015-11-17 NOTE — Telephone Encounter (Signed)
Okay to refill? 

## 2015-11-17 NOTE — Telephone Encounter (Signed)
PATIENT WOULD LIKE RX OF HER HYDROCODONE  [phone]

## 2015-11-17 NOTE — Telephone Encounter (Signed)
Ok to refill??  Last office visit 10/26/2015.  Last refill 10/16/2015.

## 2015-11-19 ENCOUNTER — Ambulatory Visit (INDEPENDENT_AMBULATORY_CARE_PROVIDER_SITE_OTHER): Payer: Medicare HMO | Admitting: Pulmonary Disease

## 2015-11-19 ENCOUNTER — Encounter: Payer: Self-pay | Admitting: Pulmonary Disease

## 2015-11-19 VITALS — BP 158/76 | HR 79 | Ht 65.0 in | Wt 262.0 lb

## 2015-11-19 DIAGNOSIS — E662 Morbid (severe) obesity with alveolar hypoventilation: Secondary | ICD-10-CM | POA: Diagnosis not present

## 2015-11-19 DIAGNOSIS — J4520 Mild intermittent asthma, uncomplicated: Secondary | ICD-10-CM

## 2015-11-19 DIAGNOSIS — G4733 Obstructive sleep apnea (adult) (pediatric): Secondary | ICD-10-CM | POA: Diagnosis not present

## 2015-11-19 DIAGNOSIS — J45909 Unspecified asthma, uncomplicated: Secondary | ICD-10-CM

## 2015-11-19 DIAGNOSIS — K219 Gastro-esophageal reflux disease without esophagitis: Secondary | ICD-10-CM

## 2015-11-19 DIAGNOSIS — J984 Other disorders of lung: Secondary | ICD-10-CM | POA: Diagnosis not present

## 2015-11-19 LAB — PULMONARY FUNCTION TEST
FEF 25-75 Post: 1.86 L/sec
FEF 25-75 Pre: 1.78 L/sec
FEF2575-%Change-Post: 4 %
FEF2575-%PRED-PRE: 93 %
FEF2575-%Pred-Post: 97 %
FEV1-%Change-Post: 0 %
FEV1-%PRED-PRE: 71 %
FEV1-%Pred-Post: 70 %
FEV1-POST: 1.43 L
FEV1-PRE: 1.44 L
FEV1FVC-%CHANGE-POST: 4 %
FEV1FVC-%Pred-Pre: 109 %
FEV6-%CHANGE-POST: -5 %
FEV6-%PRED-POST: 63 %
FEV6-%PRED-PRE: 67 %
FEV6-POST: 1.59 L
FEV6-PRE: 1.68 L
FEV6FVC-%PRED-POST: 103 %
FEV6FVC-%PRED-PRE: 103 %
FVC-%Change-Post: -5 %
FVC-%PRED-POST: 61 %
FVC-%Pred-Pre: 64 %
FVC-POST: 1.59 L
FVC-Pre: 1.68 L
POST FEV6/FVC RATIO: 100 %
PRE FEV1/FVC RATIO: 86 %
Post FEV1/FVC ratio: 90 %
Pre FEV6/FVC Ratio: 100 %

## 2015-11-19 NOTE — Progress Notes (Signed)
Subjective:    Patient ID: [name], female    DOB: [birth year], 65 y.o.   MRN: [id number]  C.C.:  Follow-up for Mild, Intermittent Asthma, Mild Restrictive Lung Disease, OSA/OHS, & GERD.  HPI Mild, Intermittent Asthma: Spiriva stopped at last appointment. She does feel like her breathing is doing better since stopping Spiriva. Reports coughing or wheezing once a week at most. No nocturnal awakenings with any breathing problems. She reports she uses her rescue inhaler maybe once or twice a day at most during times of high pollen.   Mild Restrictive Lung Disease: Likely secondary to obesity. No need for further testing.  OSA/OHS:  Currently on CPAP with nasal pillows. Started on Flonase at last appointment to help with nasal turbinate swelling. She reports she continues to use her CPAP nightly without any problems. Reports less sinus congestion since starting the Flonase. She reports good quality of sleep.  GERD:  No reflux, dyspepsia, or morning brash water taste. Reports she is compliant with her Zantac qhs.  Hypertension:  Denies any headaches. No focal vision loss. No focal weakness, numbness or tingling. No edema. Patient has not be checking her BP at home. Her Norvasc was increased from 5 to 10mg  daily at her last PCP appointment on 4/3. She denies any missed doses of her medication.  Initial blood pressure today was 200/100 but on recheck after resting by nurse had returned to a more normal range.  Review of Systems  No chest pain, tightness, or pressure. No fever, chills, or sweats. No fever, chills, or sweats.   No Known Allergies  Current Outpatient Prescriptions on File Prior to Visit  Medication Sig Dispense Refill  . albuterol (PROVENTIL HFA;VENTOLIN HFA) 108 (90 Base) MCG/ACT inhaler Inhale 2 puffs into the lungs every 4 (four) hours as needed for wheezing or shortness of breath. 1 Inhaler 3  . allopurinol (ZYLOPRIM) 100 MG tablet Take 1 tablet (100 mg total) by mouth  daily. 90 tablet 3  . amLODipine (NORVASC) 10 MG tablet Take 1 tablet (10 mg total) by mouth daily. 90 tablet 1  . aspirin EC 81 MG tablet Take 81 mg by mouth daily.    Marland Kitchen atorvastatin (LIPITOR) 10 MG tablet TAKE 1 TABLET BY MOUTH EVERY DAY 90 tablet 3  . clotrimazole-betamethasone (LOTRISONE) cream Apply 1 application topically 2 (two) times daily as needed. To be applied to the feet as needed 45 g 3  . fluticasone (CUTIVATE) 0.05 % cream APPLY EXTERNALLY TO THE AFFECTED AREA DAILY 30 g 3  . fluticasone (FLONASE) 50 MCG/ACT nasal spray Place 1 spray into both nostrils 2 (two) times daily. 16 g 3  . HYDROcodone-acetaminophen (NORCO) 7.5-325 MG tablet Take 1 tablet by mouth every 6 (six) hours as needed for moderate pain. 45 tablet 0  . megestrol (MEGACE) 40 MG tablet Take 1 tablet (40 mg total) by mouth daily. 30 tablet 11  . naproxen (NAPROSYN) 500 MG tablet TAKE 1 TABLET BY MOUTH TWICE DAILY (Patient taking differently: TAKE 1 TABLET BY MOUTH TWICE DAILY as needed) 60 tablet 0  . NON FORMULARY at bedtime. CPAP and 1 lpm oxygen qhs    . ranitidine (ZANTAC) 150 MG tablet Take 1 tablet (150 mg total) by mouth at bedtime. 90 tablet 3  . traZODone (DESYREL) 50 MG tablet TAKE 1/2 TO 1 TABLET(25 TO 50 MG) BY MOUTH AT BEDTIME AS NEEDED FOR SLEEP 90 tablet 3   No current facility-administered medications on file prior to visit.  Past Medical History  Diagnosis Date  . Obesity hypoventilation syndrome ([place])   . Obstructive sleep apnea   . Osteoarthritis   . Low back pain   . HTN (hypertension)   . Hyperlipidemia   . GERD (gastroesophageal reflux disease)   . Depression   . Anxiety   . Gout   . Achilles tendinitis   . Asthma     Past Surgical History  Procedure Laterality Date  . Tubal ligation    . Cardiac catheterization  2006  . Colonoscopy N/A 12/25/2013    Procedure: COLONOSCOPY;  Surgeon: Rogene Houston, MD;  Location: [facility];  Service: Endoscopy;  Laterality: N/A;   730-rescheduled Ann notified pt  . Cataract extraction w/phaco Left 09/17/2015    Procedure: CATARACT EXTRACTION PHACO AND INTRAOCULAR LENS PLACEMENT LEFT EYE cde=8.58;  Surgeon: Tonny Branch, MD;  Location: [facility] ORS;  Service: Ophthalmology;  Laterality: Left;    Family History  Problem Relation Age of Onset  . Heart disease Mother   . Thyroid disease Mother   . Heart failure Father   . Lung disease Father   . Diabetes Brother   . Crohn's disease Daughter     Social History   Social History  . Marital Status: Widowed    Spouse Name: N/A  . Number of Children: N/A  . Years of Education: N/A   Occupational History  . disabled    Social History Main Topics  . Smoking status: Former Smoker -- 2.00 packs/day for 36 years    Types: Cigarettes    Start date: 06/25/1969    Quit date: 03/25/2005  . Smokeless tobacco: Never Used  . Alcohol Use: No  . Drug Use: No  . Sexual Activity: No   Other Topics Concern  . None   Social History Narrative   Originally from [place]. She has always lived in [place]. 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 No pets currently. No bird, mold, or hot tub exposure.    Objective:   Physical Exam BP 158/76 mmHg  Pulse 79  Ht 5\' 5"  (1.651 m)  Wt 262 lb (118.842 kg)  BMI 43.60 kg/m2  SpO2 99% General:  Obese female. No distress. Alert. Integument: Warm and dry. No rash on exposed skin.  HEENT:  Moist mucus membranes. Unchanged moderate bilateral nasal turbinate swelling. Cardiovascular:  Regular rate & rhythm. No edema. Normal S1 & S2. Pulmonary:  Clear bilaterally to auscultation. Normal work of breathing on room air. Speaking in complete sentences. Abdomen: Soft. Normal bowel sounds. Protuberant. Musculoskeletal:  Normal bulk and tone. No joint deformity or effusion appreciated.  PFT 11/19/15: FVC 1.68 L (64%) FEV1 1.44 L (71%) FEV1/FVC 0.86 FEF 25-75 1.78 L (93%) negative bronchodilator response 08/10/15: FVC 1.78 L  (68%) FEV1 1.55 L (76%) FEV1/FVC 0.87 FEF 25-75 2.05 L (107%) negative bronchodilator response TLC 3.76 L (72%) RV 81% ERV 17% DLCO uncorrected 37% 04/10/13: FVC 1.61 L (59%) FEV1 1.14 L (66%) FEV1/FVC 0.88 FEF 25-75 2.08 L (102%) positive bronchodilator response TLC 4.10 L (77%) RV 97% ERV 43% DLCO uncorrected 57%   6MWT 08/11/15:  Walked 247.5 meters / Baseline Sat 100% on RA / Nadir Sat 97% on RA  LABS 02/24/15 CBC: 8.0/12.5/36.9/278 BMP: 140/4.5/106/27/11/0.88/93/8.9 LFT: 3.6/6.7/0.7/88/12/26  05/12/14 ABG:  7.40 / 44 / 66 / 92%    Assessment & Plan:   66 year old with underlying mild,intermittent asthma. Patient seems to have no significant worsening in her asthma control after  discontinuing Spiriva therapy. Her spirometry today remains stable going back to 2014 without a significant bronchodilator response which is also reassuring. She is using her rescue inhaler a little bit more frequently than what I would prefer or expect. Even so, I am not going to escalate her inhaled medication therapy at this time. She reports improving sinus congestion with the use of intranasal steroid therapy and this will be continued as well. Her reflux remains well-controlled on her current regimen of Zantac. The patient's blood pressure today immediately post her spirometry was significantly elevated but the patient has no symptoms that would be consistent with malignant hypertension and on repeat her blood pressure had returned to a more normal range. I instructed the patient to contact my office if she had any new breathing problems before her next appointment as I would be happy to see her sooner.  1. Mild, intermittent asthma: Continuing with her albuterol rescue inhaler. Holding on initiating additional medications at this time. Repeat spirometry with bronchodilator challenge next appointment. 2. Mild Restrictive lung disease: Likely secondary to obesity. No further imaging or testing at this  time. 3. OSA/OHS: Continuing on CPAP therapy indefinitely. Continuing Flonase. 4. GERD: Good control on Zantac. No changes. 5. Essential Hypertension: Currently on Norvasc 10 mg by mouth daily. Repeat blood pressure check significantly improved post spirometry. Defer to primary care physician on further medication adjustments and management. 6. Health maintenance: Patient received Prevnar April 2016 & Influenza Vaccine October 2016. Plan for Pneumovax at next appointment. 7. Follow-up: Patient will return to clinic in 3 months or sooner if needed.   Sonia Baller Ashok Cordia, M.D. [facility] Pulmonary [facility] Critical [facility] Pager:  [phone] After 3pm or if no response, call [phone] 11:22 AM 11/19/2015

## 2015-11-19 NOTE — Progress Notes (Signed)
Spirometry pre and post done today. 

## 2015-11-19 NOTE — Patient Instructions (Signed)
   Continue using your medications as prescribed.   Call my office if you have any new breathing problems or questions before your next appointment.  We will do a breathing test at your next appointment.  I will see back in 3 months or sooner if needed  TESTS ORDERED: 1. Spirometry with bronchodilator challenge at next appointment

## 2015-12-17 ENCOUNTER — Telehealth: Payer: Self-pay | Admitting: Family Medicine

## 2015-12-17 MED ORDER — HYDROCODONE-ACETAMINOPHEN 7.5-325 MG PO TABS: 1.0000 | ORAL_TABLET | Freq: Four times a day (QID) | ORAL | Status: DC | PRN

## 2015-12-17 NOTE — Telephone Encounter (Signed)
okay

## 2015-12-17 NOTE — Telephone Encounter (Signed)
Patient needs rx for her hydrocodone  [phone]

## 2015-12-17 NOTE — Telephone Encounter (Signed)
Prescription printed and patient made aware to come to office to pick up on 12/18/2015.

## 2015-12-17 NOTE — Telephone Encounter (Signed)
Ok to refill??  Last office visit 10/26/2015.  Last refill 11/17/2015.

## 2016-01-15 ENCOUNTER — Telehealth: Payer: Self-pay | Admitting: Family Medicine

## 2016-01-15 MED ORDER — HYDROCODONE-ACETAMINOPHEN 7.5-325 MG PO TABS: 1.0000 | ORAL_TABLET | Freq: Four times a day (QID) | ORAL | Status: DC | PRN

## 2016-01-15 NOTE — Telephone Encounter (Signed)
Okay to refill? 

## 2016-01-15 NOTE — Telephone Encounter (Signed)
Ok to refill??  Last office visit 10/26/2015.  Last refill 12/17/2015.

## 2016-01-15 NOTE — Telephone Encounter (Signed)
Prescription printed and patient made aware to come to office to pick up after 2pm on 01/15/2016.

## 2016-01-15 NOTE — Telephone Encounter (Signed)
Pt is requesting a refill of Hydrocodone 7.5-325 [phone]

## 2016-01-21 DIAGNOSIS — J449 Chronic obstructive pulmonary disease, unspecified: Secondary | ICD-10-CM | POA: Diagnosis not present

## 2016-01-27 ENCOUNTER — Ambulatory Visit (INDEPENDENT_AMBULATORY_CARE_PROVIDER_SITE_OTHER): Payer: Medicare Other | Admitting: Family Medicine

## 2016-01-27 ENCOUNTER — Encounter: Payer: Self-pay | Admitting: Family Medicine

## 2016-01-27 VITALS — BP 138/72 | HR 88 | Temp 98.3°F | Resp 14 | Ht 65.0 in | Wt 263.0 lb

## 2016-01-27 DIAGNOSIS — I10 Essential (primary) hypertension: Secondary | ICD-10-CM

## 2016-01-27 DIAGNOSIS — M10072 Idiopathic gout, left ankle and foot: Secondary | ICD-10-CM

## 2016-01-27 DIAGNOSIS — M150 Primary generalized (osteo)arthritis: Secondary | ICD-10-CM

## 2016-01-27 DIAGNOSIS — M159 Polyosteoarthritis, unspecified: Secondary | ICD-10-CM

## 2016-01-27 DIAGNOSIS — M8949 Other hypertrophic osteoarthropathy, multiple sites: Secondary | ICD-10-CM

## 2016-01-27 DIAGNOSIS — R609 Edema, unspecified: Secondary | ICD-10-CM

## 2016-01-27 LAB — BASIC METABOLIC PANEL
BUN: 7 mg/dL (ref 7–25)
CALCIUM: 9.1 mg/dL (ref 8.6–10.4)
CO2: 26 mmol/L (ref 20–31)
Chloride: 106 mmol/L (ref 98–110)
Creat: 0.92 mg/dL (ref 0.50–0.99)
GLUCOSE: 94 mg/dL (ref 70–99)
POTASSIUM: 4.2 mmol/L (ref 3.5–5.3)
SODIUM: 141 mmol/L (ref 135–146)

## 2016-01-27 MED ORDER — AMLODIPINE BESYLATE 5 MG PO TABS: 5.0000 mg | ORAL_TABLET | Freq: Every day | ORAL | Status: DC

## 2016-01-27 MED ORDER — LOSARTAN POTASSIUM 25 MG PO TABS
25.0000 mg | ORAL_TABLET | Freq: Every day | ORAL | Status: DC
Start: 2016-01-27 — End: 2016-02-10

## 2016-01-27 MED ORDER — HYDROCODONE-ACETAMINOPHEN 7.5-325 MG PO TABS: 1.0000 | ORAL_TABLET | Freq: Four times a day (QID) | ORAL | Status: DC | PRN

## 2016-01-27 NOTE — Patient Instructions (Signed)
Take 1/2 tablet of the amlopidine  Elevate your legs  Take the new blood pressure losartan  F/U 2 weeks for recheck

## 2016-01-27 NOTE — Progress Notes (Signed)
Patient ID: [name], female   DOB: 06/03/1951, 65 y.o.   MRN: [id number]   Subjective:    Patient ID: [name], female    DOB: [birth year], 65 y.o.   MRN: [id number]  Patient presents for 3 month F/U Patient for follow-up of chronic medical problems medications reviewed at bedside. At the last visit her amlodipine was increased to 10 mg for hypertension.She has noticed more swelling in her feet she has to cut her shoes because the swelling on top of her feet. He's been trying to elevate them. She continues to follow with pulmonary for her asthma She continues using her pain medication for her osteoarthritis in her heel spurs  Review Of Systems:  GEN- denies fatigue, fever, weight loss,weakness, recent illness HEENT- denies eye drainage, change in vision, nasal discharge, CVS- denies chest pain, palpitations RESP- denies SOB, cough, wheeze ABD- denies N/V, change in stools, abd pain GU- denies dysuria, hematuria, dribbling, incontinence MSK- denies joint pain, muscle aches, injury Neuro- denies headache, dizziness, syncope, seizure activity       Objective:    BP 138/72 mmHg  Pulse 88  Temp(Src) 98.3 F (36.8 C) (Oral)  Resp 14  Ht 5\' 5"  (1.651 m)  Wt 263 lb (119.296 kg)  BMI 43.77 kg/m2 GEN- NAD, alert and oriented x3 HEENT- PERRL, EOMI, non injected sclera, pink conjunctiva, MMM, oropharynx clear Neck- Supple, no jvd CVS- RRR, no murmur RESP-CTAB EXT- 1+ non ptting  Edema bilat R>l Pulses- Radial 2+, DP- 1+        Assessment & Plan:      Problem List Items Addressed This Visit    Osteoarthritis    Pain medication refilled for the month of July      Relevant Medications   HYDROcodone-acetaminophen (NORCO) 7.5-325 MG tablet   Morbid obesity ([place])   Gout    Doing well with allopurinol avoiding hydrochlorothiazide      Essential hypertension - Primary    Blood pressure is controlled however with her swelling will decrease her Norvasc back to 5  mg and add losartan 25 mg      Relevant Medications   losartan (COZAAR) 25 MG tablet   amLODipine (NORVASC) 5 MG tablet   Other Relevant Orders   Basic metabolic panel    Other Visit Diagnoses    Peripheral edema        worsned due to high dose Norvasc, decrease norvasc elevate feet, recheck in 2 weeks       Note: This dictation was prepared with Dragon dictation along with smaller phrase technology. Any transcriptional errors that result from this process are unintentional.

## 2016-01-27 NOTE — Assessment & Plan Note (Signed)
Blood pressure is controlled however with her swelling will decrease her Norvasc back to 5 mg and add losartan 25 mg

## 2016-01-27 NOTE — Assessment & Plan Note (Signed)
Doing well with allopurinol avoiding hydrochlorothiazide

## 2016-01-27 NOTE — Assessment & Plan Note (Signed)
Pain medication refilled for the month of July

## 2016-02-03 ENCOUNTER — Encounter: Payer: Self-pay | Admitting: *Deleted

## 2016-02-10 ENCOUNTER — Ambulatory Visit (INDEPENDENT_AMBULATORY_CARE_PROVIDER_SITE_OTHER): Payer: Medicare Other | Admitting: Family Medicine

## 2016-02-10 ENCOUNTER — Encounter: Payer: Self-pay | Admitting: Family Medicine

## 2016-02-10 VITALS — BP 168/80 | HR 80 | Temp 98.7°F | Resp 18 | Wt 262.0 lb

## 2016-02-10 DIAGNOSIS — I10 Essential (primary) hypertension: Secondary | ICD-10-CM | POA: Diagnosis not present

## 2016-02-10 DIAGNOSIS — R609 Edema, unspecified: Secondary | ICD-10-CM | POA: Diagnosis not present

## 2016-02-10 MED ORDER — LOSARTAN POTASSIUM 50 MG PO TABS: 50.0000 mg | ORAL_TABLET | Freq: Every day | ORAL | Status: DC

## 2016-02-10 MED ORDER — FUROSEMIDE 20 MG PO TABS: 20.0000 mg | ORAL_TABLET | Freq: Every day | ORAL | Status: DC

## 2016-02-10 NOTE — Patient Instructions (Addendum)
Take the lasix 20mg  once a day in the morning Increase losartan to 50mg  ( take 2 of the 25mg  once a day )  F/U 1 month

## 2016-02-10 NOTE — Assessment & Plan Note (Signed)
Increase losartan 50 mg once a day. We'll discontinue amlodipine altogether secondary to the fluid retention. If also given her Lasix 20 mg to take daily for swelling and blood pressure. We will recheck her renal function her follow-up visit in 4 weeks

## 2016-02-10 NOTE — Progress Notes (Signed)
Patient ID: [name], female   DOB: 1951-03-07, 65 y.o.   MRN: [id number]   Subjective:    Patient ID: [name], female    DOB: 25-Nov-1950, 65 y.o.   MRN: [id number]  Patient presents for 3 month F/U Is here for interim follow-up on her leg swelling. She did try to decrease her Norvasc to 5 mg she still has significant swelling. She stopped it altogether her swelling has gone down significantly. Her blood pressure however is quite elevated she is on losartan 25 mg once a day. No new concerns no chest pain or shortness of breath urinating as normal.    Review Of Systems:  GEN- denies fatigue, fever, weight loss,weakness, recent illness HEENT- denies eye drainage, change in vision, nasal discharge, CVS- denies chest pain, palpitations RESP- denies SOB, cough, wheeze ABD- denies N/V, change in stools, abd pain Neuro- denies headache, dizziness, syncope, seizure activity       Objective:    BP 168/80 mmHg  Pulse 80  Temp(Src) 98.7 F (37.1 C) (Oral)  Resp 18  Wt 262 lb (118.842 kg) GEN- NAD, alert and oriented x3 CVS- RRR, no murmur RESP-CTAB EXT- non pitting edema to mid shins much improved, Pulses- Radial 2+        Assessment & Plan:      Problem List Items Addressed This Visit    Peripheral edema   Essential hypertension - Primary    Increase losartan 50 mg once a day. We'll discontinue amlodipine altogether secondary to the fluid retention. If also given her Lasix 20 mg to take daily for swelling and blood pressure. We will recheck her renal function her follow-up visit in 4 weeks      Relevant Medications   losartan (COZAAR) 50 MG tablet   furosemide (LASIX) 20 MG tablet      Note: This dictation was prepared with Dragon dictation along with smaller phrase technology. Any transcriptional errors that result from this process are unintentional.

## 2016-02-18 ENCOUNTER — Encounter: Payer: Self-pay | Admitting: Family Medicine

## 2016-02-20 DIAGNOSIS — J449 Chronic obstructive pulmonary disease, unspecified: Secondary | ICD-10-CM | POA: Diagnosis not present

## 2016-02-22 ENCOUNTER — Ambulatory Visit (INDEPENDENT_AMBULATORY_CARE_PROVIDER_SITE_OTHER): Payer: Medicare Other | Admitting: Pulmonary Disease

## 2016-02-22 ENCOUNTER — Encounter: Payer: Self-pay | Admitting: Pulmonary Disease

## 2016-02-22 VITALS — BP 126/78 | HR 62 | Ht 65.0 in | Wt 260.0 lb

## 2016-02-22 DIAGNOSIS — J4520 Mild intermittent asthma, uncomplicated: Secondary | ICD-10-CM

## 2016-02-22 DIAGNOSIS — Z9989 Dependence on other enabling machines and devices: Secondary | ICD-10-CM

## 2016-02-22 DIAGNOSIS — J984 Other disorders of lung: Secondary | ICD-10-CM | POA: Diagnosis not present

## 2016-02-22 DIAGNOSIS — Z23 Encounter for immunization: Secondary | ICD-10-CM

## 2016-02-22 DIAGNOSIS — G4733 Obstructive sleep apnea (adult) (pediatric): Secondary | ICD-10-CM | POA: Diagnosis not present

## 2016-02-22 DIAGNOSIS — J45909 Unspecified asthma, uncomplicated: Secondary | ICD-10-CM | POA: Diagnosis not present

## 2016-02-22 DIAGNOSIS — K219 Gastro-esophageal reflux disease without esophagitis: Secondary | ICD-10-CM

## 2016-02-22 LAB — PULMONARY FUNCTION TEST
DL/VA % pred: 132 %
DL/VA: 6.54 ml/min/mmHg/L
DLCO UNC: 18.53 ml/min/mmHg
DLCO cor % pred: 79 %
DLCO cor: 20.28 ml/min/mmHg
DLCO unc % pred: 72 %
FEF 25-75 Post: 1.33 L/sec
FEF 25-75 Pre: 1.67 L/sec
FEF2575-%Change-Post: -20 %
FEF2575-%PRED-POST: 70 %
FEF2575-%Pred-Pre: 88 %
FEV1-%CHANGE-POST: -6 %
FEV1-%PRED-POST: 60 %
FEV1-%PRED-PRE: 65 %
FEV1-Post: 1.22 L
FEV1-Pre: 1.31 L
FEV1FVC-%CHANGE-POST: 5 %
FEV1FVC-%Pred-Pre: 110 %
FEV6-%Change-Post: -11 %
FEV6-%Pred-Post: 53 %
FEV6-%Pred-Pre: 60 %
FEV6-Post: 1.34 L
FEV6-Pre: 1.52 L
FEV6FVC-%Pred-Post: 103 %
FEV6FVC-%Pred-Pre: 103 %
FVC-%CHANGE-POST: -11 %
FVC-%PRED-PRE: 58 %
FVC-%Pred-Post: 51 %
FVC-POST: 1.34 L
FVC-Pre: 1.52 L
PRE FEV1/FVC RATIO: 86 %
Post FEV1/FVC ratio: 91 %
Post FEV6/FVC ratio: 100 %
Pre FEV6/FVC Ratio: 100 %

## 2016-02-22 NOTE — Progress Notes (Signed)
Test reviewed.  

## 2016-02-22 NOTE — Patient Instructions (Addendum)
   Continue using your albuterol inhaler as needed.  Continue using your Zantac.  Let me know if you have any new breathing problems before your next appointment and I will be happy to see you sooner.

## 2016-02-22 NOTE — Progress Notes (Signed)
Subjective:    Patient ID: [name], female    DOB: 12-10-1950, 65 y.o.   MRN: [id number]  C.C.:  Follow-up for Mild, Intermittent Asthma, Mild Restrictive Lung Disease, OSA/OHS, & GERD.  HPI Mild, Intermittent Asthma: Spiriva previously stopped. She denies any new breathing problems. No exacerbations since last appointment. She reports she is coughing intermittently but this is nonproductive. She does have rare, intermittent wheezing. Denies any nocturnal coughing or wheezing. She reports she coughs & has symptoms mostly in the morning. She is using her rescue inhaler 1-2 times per week.   Mild Restrictive Lung Disease: Likely secondary to obesity. No need for further testing.  OSA/OHS:  Currently on CPAP with nasal pillows. She reports she is sleeping all night with her machine. No daytime naps. She does feel like she gets good quality sleep. Still using Flonase with good results.   GERD:  On Zantac. No reflux, dyspepsia, or morning brash water taste. Compliant with medication.   Review of Systems  No chest pain or pressure. No fever, chills, or sweats. She reports she has had some sinus congestion this morning. Denies any drainage. No sinus pain.   No Known Allergies  Current Outpatient Prescriptions on File Prior to Visit  Medication Sig Dispense Refill  . albuterol (PROVENTIL HFA;VENTOLIN HFA) 108 (90 Base) MCG/ACT inhaler Inhale 2 puffs into the lungs every 4 (four) hours as needed for wheezing or shortness of breath. 1 Inhaler 3  . allopurinol (ZYLOPRIM) 100 MG tablet Take 1 tablet (100 mg total) by mouth daily. 90 tablet 3  . aspirin EC 81 MG tablet Take 81 mg by mouth daily.    Marland Kitchen atorvastatin (LIPITOR) 10 MG tablet TAKE 1 TABLET BY MOUTH EVERY DAY 90 tablet 3  . clotrimazole-betamethasone (LOTRISONE) cream Apply 1 application topically 2 (two) times daily as needed. To be applied to the feet as needed 45 g 3  . fluticasone (CUTIVATE) 0.05 % cream APPLY EXTERNALLY TO THE  AFFECTED AREA DAILY 30 g 3  . fluticasone (FLONASE) 50 MCG/ACT nasal spray Place 1 spray into both nostrils 2 (two) times daily. 16 g 3  . furosemide (LASIX) 20 MG tablet Take 1 tablet (20 mg total) by mouth daily. For swelling 30 tablet 3  . HYDROcodone-acetaminophen (NORCO) 7.5-325 MG tablet Take 1 tablet by mouth every 6 (six) hours as needed for moderate pain. 45 tablet 0  . losartan (COZAAR) 50 MG tablet Take 1 tablet (50 mg total) by mouth daily. 30 tablet 3  . megestrol (MEGACE) 40 MG tablet Take 1 tablet (40 mg total) by mouth daily. 30 tablet 11  . naproxen (NAPROSYN) 500 MG tablet TAKE 1 TABLET BY MOUTH TWICE DAILY (Patient taking differently: TAKE 1 TABLET BY MOUTH TWICE DAILY as needed) 60 tablet 0  . NON FORMULARY at bedtime. CPAP and 1 lpm oxygen qhs    . ranitidine (ZANTAC) 150 MG tablet Take 1 tablet (150 mg total) by mouth at bedtime. 90 tablet 3  . traZODone (DESYREL) 50 MG tablet TAKE 1/2 TO 1 TABLET(25 TO 50 MG) BY MOUTH AT BEDTIME AS NEEDED FOR SLEEP 90 tablet 3   No current facility-administered medications on file prior to visit.     Past Medical History:  Diagnosis Date  . Achilles tendinitis   . Anxiety   . Asthma   . Depression   . GERD (gastroesophageal reflux disease)   . Gout   . HTN (hypertension)   . Hyperlipidemia   . Low  back pain   . Obesity hypoventilation syndrome ([place])   . Obstructive sleep apnea   . Osteoarthritis     Past Surgical History:  Procedure Laterality Date  . CARDIAC CATHETERIZATION  2006  . CATARACT EXTRACTION W/PHACO Left 09/17/2015   Procedure: CATARACT EXTRACTION PHACO AND INTRAOCULAR LENS PLACEMENT LEFT EYE cde=8.58;  Surgeon: Tonny Branch, MD;  Location: [facility];  Service: Ophthalmology;  Laterality: Left;  . COLONOSCOPY N/A 12/25/2013   Procedure: COLONOSCOPY;  Surgeon: Rogene Houston, MD;  Location: [facility] SUITE;  Service: Endoscopy;  Laterality: N/A;  730-rescheduled Ann notified pt  . TUBAL LIGATION      Family History    Problem Relation Age of Onset  . Heart disease Mother   . Thyroid disease Mother   . Heart failure Father   . Lung disease Father   . Diabetes Brother   . Crohn's disease Daughter     Social History   Social History  . Marital status: Widowed    Spouse name: N/A  . Number of children: N/A  . Years of education: N/A   Occupational History  . disabled Unemployed   Social History Main Topics  . Smoking status: Former Smoker    Packs/day: 2.00    Years: 36.00    Types: Cigarettes    Start date: 06/25/1969    Quit date: 03/25/2005  . Smokeless tobacco: Never Used  . Alcohol use No  . Drug use: No  . Sexual activity: No   Other Topics Concern  . None   Social History Narrative   Originally from [place]. She has always lived in [place]. 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 No pets currently. No bird, mold, or hot tub exposure.    Objective:   Physical Exam BP 126/78 (BP Location: Left Arm, Cuff Size: Large)   Pulse 62   Ht 5\' 5"  (1.651 m)   Wt 260 lb (117.9 kg)   SpO2 98%   BMI 43.27 kg/m  General:  Obese female. Awake. Comfortable. Integument: Warm and dry. No rash on exposed skin.  HEENT:  Moist mucus membranes. Persistent bilateral nasal turbinate swelling. No oral ulcers. Cardiovascular:  Regular rate & rhythm. Trace edema bilateral lower extremities. Normal S1 & S2. Pulmonary:  Good aeration bilaterally. Normal work of breathing on room air. Clear on auscultation. Abdomen: Soft. Normal bowel sounds. Protuberant. Musculoskeletal:  Normal bulk and tone. No joint deformity or effusion appreciated.  PFT 02/22/16: FVC 1.52 L (58%) FEV1 1.31 L (65%) FEV1/FVC 0.86 FEF 25-75 1.67 L (88%) negative bronchodilator response                                                                     DLCO corrected 79% (hemoglobin 10.0) 11/19/15: FVC 1.68 L (64%) FEV1 1.44 L (71%) FEV1/FVC 0.86 FEF 25-75 1.78 L (93%) negative bronchodilator  response 08/10/15: FVC 1.78 L (68%) FEV1 1.55 L (76%) FEV1/FVC 0.87 FEF 25-75 2.05 L (107%) negative bronchodilator response TLC 3.76 L (72%) RV 81% ERV 17% DLCO uncorrected 37% 04/10/13: FVC 1.61 L (59%) FEV1 1.14 L (66%) FEV1/FVC 0.88 FEF 25-75 2.08 L (102%) positive bronchodilator response TLC 4.10 L (77%) RV 97% ERV 43% DLCO uncorrected 57%  6MWT 08/11/15:  Walked 247.5 meters / Baseline Sat 100% on RA / Nadir Sat 97% on RA  LABS 02/24/15 CBC: 8.0/12.5/36.9/278 BMP: 140/4.5/106/27/11/0.88/93/8.9 LFT: 3.6/6.7/0.7/88/12/26  05/12/14 ABG:  7.40 / 44 / 66 / 92%    Assessment & Plan:  65 year old with underlying mild,intermittent asthma.  patient's spirometry has declined since previous testing that I suspect this is not a function of her asthma given the decline in FVC and FEV1. Symptomatically her asthma seems to be well-controlled and in the absence of a significant bronchodilator response I do not feel the need to escalate inhaled medication therapy. She is compliant with her home CPAP for treatment of her OSA/OHS. Her reflexes also very well controlled at this time. I instructed the patient to notify my office if she had any new breathing problems or questions before her next appointment as I would be happy to see her sooner.   1. Mild, Intermittent Asthma: Continues to have minimal symptoms. Continuing to use albuterol inhaler as needed. 2. Mild Restrictive Lung Disease: Likely secondary to obesity. No further testing. 3. OSA/OHS: Continuing CPAP indefinitely. 4. GERD: Excellent control on Zantac. No changes. 5. Health Maintenance:  Status Post Prevnar April 2016 & Influenza Vaccine October 2016. Administering Pneumovax 23 today. 6. Follow-up: Patient will return to clinic in 6 months or sooner if needed.  Sonia Baller Ashok Cordia, M.D. [facility] Pager:  [phone] After 3pm or if no response, call [phone] 11:00 AM 02/22/16

## 2016-02-22 NOTE — Addendum Note (Signed)
Addended by: Inge Rise on: 02/22/2016 11:25 AM   Modules accepted: Orders

## 2016-03-17 ENCOUNTER — Telehealth: Payer: Self-pay | Admitting: *Deleted

## 2016-03-17 NOTE — Telephone Encounter (Signed)
Records indicate prescription refill appropriate for Hydrocodone.  Ok to refill??  Last office visit 01/27/2016.  Last refill 01/27/2016, but patient usually picks up prescription on 24/25 of month.

## 2016-03-18 ENCOUNTER — Encounter: Payer: Self-pay | Admitting: Family Medicine

## 2016-03-18 ENCOUNTER — Ambulatory Visit (INDEPENDENT_AMBULATORY_CARE_PROVIDER_SITE_OTHER): Payer: Medicare Other | Admitting: Family Medicine

## 2016-03-18 ENCOUNTER — Other Ambulatory Visit: Payer: Self-pay | Admitting: Family Medicine

## 2016-03-18 VITALS — BP 132/74 | HR 68 | Temp 97.8°F | Resp 14 | Ht 65.0 in | Wt 261.0 lb

## 2016-03-18 DIAGNOSIS — I10 Essential (primary) hypertension: Secondary | ICD-10-CM

## 2016-03-18 DIAGNOSIS — L729 Follicular cyst of the skin and subcutaneous tissue, unspecified: Secondary | ICD-10-CM

## 2016-03-18 DIAGNOSIS — R609 Edema, unspecified: Secondary | ICD-10-CM

## 2016-03-18 DIAGNOSIS — L089 Local infection of the skin and subcutaneous tissue, unspecified: Secondary | ICD-10-CM

## 2016-03-18 MED ORDER — LOSARTAN POTASSIUM 50 MG PO TABS: 50.0000 mg | ORAL_TABLET | Freq: Every day | ORAL | 3 refills | Status: DC

## 2016-03-18 MED ORDER — MEGESTROL ACETATE 40 MG PO TABS: 40.0000 mg | ORAL_TABLET | Freq: Every day | ORAL | 2 refills | Status: DC

## 2016-03-18 MED ORDER — ALLOPURINOL 100 MG PO TABS: 100.0000 mg | ORAL_TABLET | Freq: Every day | ORAL | 3 refills | Status: DC

## 2016-03-18 MED ORDER — HYDROCODONE-ACETAMINOPHEN 7.5-325 MG PO TABS: 1.0000 | ORAL_TABLET | Freq: Four times a day (QID) | ORAL | 0 refills | Status: DC | PRN

## 2016-03-18 MED ORDER — CEPHALEXIN 500 MG PO CAPS: 500.0000 mg | ORAL_CAPSULE | Freq: Two times a day (BID) | ORAL | 0 refills | Status: DC

## 2016-03-18 NOTE — Assessment & Plan Note (Signed)
Controlled on losartan 50mg  at bedtime

## 2016-03-18 NOTE — Progress Notes (Signed)
   Subjective:    Patient ID: [name], female    DOB: [birth year], 65 y.o.   MRN: [id number]  Patient presents for Follow-up (1 month- is not fasting) and Hard Knot to Chest (x2 months- has open area in between breasts, states that it is getting bigger- denies drainage, tenderness)  Patient here to follow-up at her last visit I changed her losartan to 50 mg we stopped the amlodipine because of leg swelling which this did improve her swelling. She is also on Lasix 20 mg without any difficulties. She states that her legs and feet feel much better.   She is concerned about a cyst that has been in the center of her chest for the past couple months she noticed some drainage from it this week she denies any tenderness no fever. She also requests a refill on her pain medicine   Review Of Systems:  GEN- denies fatigue, fever, weight loss,weakness, recent illness HEENT- denies eye drainage, change in vision, nasal discharge, CVS- denies chest pain, palpitations RESP- denies SOB, cough, wheeze ABD- denies N/V, change in stools, abd pain GU- denies dysuria, hematuria, dribbling, incontinence MSK- denies joint pain, muscle aches, injury Neuro- denies headache, dizziness, syncope, seizure activity       Objective:    BP 132/74 (BP Location: Left Arm, Patient Position: Sitting, Cuff Size: Large)   Pulse 68   Temp 97.8 F (36.6 C) (Oral)   Resp 14   Ht 5\' 5"  (1.651 m)   Wt 261 lb (118.4 kg)   BMI 43.43 kg/m  GEN- NAD, alert and oriented x3 HEENT- PERRL, EOMI, non injected sclera, pink conjunctiva, MMM, oropharynx clear CVS- RRR, no murmur RESP-CTAB Skin- center chest wall between breast- cyst with pus expressed from pit, no erythema  EXT-trace pedal  edema Pulses- Radial2+        Assessment & Plan:     Able to express large amount of pus from the cyst at bedside , no actual I and D done    Problem List Items Addressed This Visit    Peripheral edema    Continue lasix,  symptoms controlled      Essential hypertension    Controlled on losartan 50mg  at bedtime      Relevant Medications   losartan (COZAAR) 50 MG tablet    Other Visit Diagnoses    Infected cyst of skin    -  Primary   Treat wiht warm compreses and Keflex       Note: This dictation was prepared with Dragon dictation along with smaller phrase technology. Any transcriptional errors that result from this process are unintentional.

## 2016-03-18 NOTE — Telephone Encounter (Signed)
Already refilled during OV

## 2016-03-18 NOTE — Assessment & Plan Note (Signed)
Continue lasix, symptoms controlled

## 2016-03-18 NOTE — Patient Instructions (Signed)
F/U end of Dec for Physical Take antibiotics, warm compress Continue all other medications

## 2016-03-22 DIAGNOSIS — J449 Chronic obstructive pulmonary disease, unspecified: Secondary | ICD-10-CM | POA: Diagnosis not present

## 2016-03-26 ENCOUNTER — Encounter: Payer: Self-pay | Admitting: Family Medicine

## 2016-04-01 ENCOUNTER — Telehealth: Payer: Self-pay | Admitting: Family Medicine

## 2016-04-01 NOTE — Telephone Encounter (Signed)
Call placed to [facility]. Unable to determine what documentation patient is requesting.   Call placed to patient to inquire. [name].Marland Kitchen

## 2016-04-01 NOTE — Telephone Encounter (Signed)
Received return call from patient.   Reports that she was receiveing [employer] services, but her current company is not covered by [employer] (she had [employer] to begin with).   Ok to file new [employer] forms?

## 2016-04-01 NOTE — Telephone Encounter (Signed)
okay

## 2016-04-01 NOTE — Telephone Encounter (Signed)
Pt calling.  Says needs a new statement faxed to [employer] so she can continue to get aides in the home to assist her due to her medical conditions. If questions call pt or [facility] [phone].

## 2016-04-04 NOTE — Telephone Encounter (Signed)
Forms completed and faxed.  

## 2016-04-14 ENCOUNTER — Telehealth: Payer: Self-pay | Admitting: Family Medicine

## 2016-04-14 NOTE — Telephone Encounter (Signed)
Ok to refill??  Last office visit/ refill 03/18/2016.

## 2016-04-14 NOTE — Telephone Encounter (Signed)
Patient calling for rx for her hydrocodone  [phone]

## 2016-04-15 MED ORDER — HYDROCODONE-ACETAMINOPHEN 7.5-325 MG PO TABS: 1.0000 | ORAL_TABLET | Freq: Four times a day (QID) | ORAL | 0 refills | Status: DC | PRN

## 2016-04-15 NOTE — Telephone Encounter (Signed)
okay

## 2016-04-15 NOTE — Telephone Encounter (Signed)
Prescription printed and patient made aware to come to office to pick up after 2pm o n 04/15/2016.

## 2016-04-20 ENCOUNTER — Other Ambulatory Visit: Payer: Self-pay | Admitting: Family Medicine

## 2016-04-22 DIAGNOSIS — J449 Chronic obstructive pulmonary disease, unspecified: Secondary | ICD-10-CM | POA: Diagnosis not present

## 2016-05-05 ENCOUNTER — Encounter: Payer: Self-pay | Admitting: Family Medicine

## 2016-05-10 ENCOUNTER — Other Ambulatory Visit: Payer: Self-pay

## 2016-05-10 MED ORDER — ALBUTEROL SULFATE HFA 108 (90 BASE) MCG/ACT IN AERS: 2.0000 | INHALATION_SPRAY | RESPIRATORY_TRACT | 5 refills | Status: DC | PRN

## 2016-05-16 ENCOUNTER — Telehealth: Payer: Self-pay | Admitting: Family Medicine

## 2016-05-16 MED ORDER — HYDROCODONE-ACETAMINOPHEN 7.5-325 MG PO TABS: 1.0000 | ORAL_TABLET | Freq: Four times a day (QID) | ORAL | 0 refills | Status: DC | PRN

## 2016-05-16 NOTE — Telephone Encounter (Signed)
Patient calling to get rx for her hydrocodone [phone] (H)

## 2016-05-16 NOTE — Telephone Encounter (Signed)
okay

## 2016-05-16 NOTE — Telephone Encounter (Signed)
Prescription printed and patient made aware to come to office to pick up on 05/17/2016.

## 2016-05-16 NOTE — Telephone Encounter (Signed)
Ok to refill??  Last office visit 03/18/2016.  Last refill 04/15/2016.

## 2016-05-22 DIAGNOSIS — J449 Chronic obstructive pulmonary disease, unspecified: Secondary | ICD-10-CM | POA: Diagnosis not present

## 2016-06-07 ENCOUNTER — Other Ambulatory Visit: Payer: Self-pay | Admitting: Family Medicine

## 2016-06-13 ENCOUNTER — Telehealth: Payer: Self-pay | Admitting: *Deleted

## 2016-06-13 ENCOUNTER — Other Ambulatory Visit: Payer: Self-pay | Admitting: Family Medicine

## 2016-06-13 MED ORDER — HYDROCODONE-ACETAMINOPHEN 7.5-325 MG PO TABS: 1.0000 | ORAL_TABLET | Freq: Four times a day (QID) | ORAL | 0 refills | Status: DC | PRN

## 2016-06-13 NOTE — Telephone Encounter (Signed)
Please see prior message.   

## 2016-06-13 NOTE — Telephone Encounter (Signed)
Records indicate prescription refill appropriate for Hydrocodone.  Ok to refill??  Last office visit 03/18/2016.  Last refill 05/16/2016.

## 2016-06-13 NOTE — Telephone Encounter (Signed)
okay

## 2016-06-13 NOTE — Telephone Encounter (Signed)
Prescription printed and patient made aware to come to office to pick up on 06/14/2016.

## 2016-06-13 NOTE — Telephone Encounter (Signed)
Patient requesting a refill on her hydrocodone  CB# [phone]

## 2016-06-22 DIAGNOSIS — J449 Chronic obstructive pulmonary disease, unspecified: Secondary | ICD-10-CM | POA: Diagnosis not present

## 2016-06-30 ENCOUNTER — Encounter: Payer: Self-pay | Admitting: Family Medicine

## 2016-07-07 ENCOUNTER — Other Ambulatory Visit: Payer: Self-pay | Admitting: Family Medicine

## 2016-07-11 ENCOUNTER — Telehealth: Payer: Self-pay | Admitting: *Deleted

## 2016-07-11 MED ORDER — HYDROCODONE-ACETAMINOPHEN 7.5-325 MG PO TABS: 1.0000 | ORAL_TABLET | Freq: Four times a day (QID) | ORAL | 0 refills | Status: DC | PRN

## 2016-07-11 NOTE — Telephone Encounter (Signed)
Records indicate prescription refill appropriate for Norco.  Ok to refill??  Last office visit 03/18/2016.  Last refill 06/13/2016.

## 2016-07-11 NOTE — Telephone Encounter (Signed)
Prescription printed and patient made aware to come to office to pick up on 07/12/2016 per VM.

## 2016-07-11 NOTE — Telephone Encounter (Signed)
Okay to refill? 

## 2016-07-14 ENCOUNTER — Other Ambulatory Visit: Payer: Self-pay | Admitting: Family Medicine

## 2016-07-22 ENCOUNTER — Encounter: Payer: Medicare Other | Admitting: Family Medicine

## 2016-07-22 DIAGNOSIS — J449 Chronic obstructive pulmonary disease, unspecified: Secondary | ICD-10-CM | POA: Diagnosis not present

## 2016-07-25 HISTORY — PX: INTRAOCULAR LENS INSERTION: SHX110

## 2016-07-27 ENCOUNTER — Other Ambulatory Visit: Payer: Self-pay | Admitting: Family Medicine

## 2016-07-27 MED ORDER — TRAZODONE HCL 50 MG PO TABS: ORAL_TABLET | ORAL | 0 refills | Status: DC

## 2016-08-08 ENCOUNTER — Other Ambulatory Visit: Payer: Self-pay | Admitting: Family Medicine

## 2016-08-09 ENCOUNTER — Ambulatory Visit (INDEPENDENT_AMBULATORY_CARE_PROVIDER_SITE_OTHER): Payer: Medicare Other | Admitting: Family Medicine

## 2016-08-09 ENCOUNTER — Encounter: Payer: Self-pay | Admitting: Family Medicine

## 2016-08-09 VITALS — BP 130/78 | HR 88 | Temp 97.7°F | Resp 16 | Ht 65.0 in | Wt 262.0 lb

## 2016-08-09 DIAGNOSIS — L720 Epidermal cyst: Secondary | ICD-10-CM

## 2016-08-09 DIAGNOSIS — I10 Essential (primary) hypertension: Secondary | ICD-10-CM

## 2016-08-09 DIAGNOSIS — R7302 Impaired glucose tolerance (oral): Secondary | ICD-10-CM | POA: Diagnosis not present

## 2016-08-09 DIAGNOSIS — F329 Major depressive disorder, single episode, unspecified: Secondary | ICD-10-CM | POA: Diagnosis not present

## 2016-08-09 DIAGNOSIS — Z1159 Encounter for screening for other viral diseases: Secondary | ICD-10-CM

## 2016-08-09 DIAGNOSIS — Z23 Encounter for immunization: Secondary | ICD-10-CM

## 2016-08-09 DIAGNOSIS — E7800 Pure hypercholesterolemia, unspecified: Secondary | ICD-10-CM

## 2016-08-09 DIAGNOSIS — Z0000 Encounter for general adult medical examination without abnormal findings: Secondary | ICD-10-CM

## 2016-08-09 LAB — CBC WITH DIFFERENTIAL/PLATELET
BASOS PCT: 1 %
Basophils Absolute: 84 cells/uL (ref 0–200)
Eosinophils Absolute: 504 cells/uL — ABNORMAL HIGH (ref 15–500)
Eosinophils Relative: 6 %
HCT: 41.4 % (ref 35.0–45.0)
Hemoglobin: 13.5 g/dL (ref 12.0–15.0)
LYMPHS PCT: 37 %
Lymphs Abs: 3108 cells/uL (ref 850–3900)
MCH: 30.5 pg (ref 27.0–33.0)
MCHC: 32.6 g/dL (ref 32.0–36.0)
MCV: 93.5 fL (ref 80.0–100.0)
MONOS PCT: 8 %
MPV: 9.3 fL (ref 7.5–12.5)
Monocytes Absolute: 672 cells/uL (ref 200–950)
NEUTROS ABS: 4032 {cells}/uL (ref 1500–7800)
Neutrophils Relative %: 48 %
PLATELETS: 332 10*3/uL (ref 140–400)
RBC: 4.43 MIL/uL (ref 3.80–5.10)
RDW: 13.9 % (ref 11.0–15.0)
WBC: 8.4 10*3/uL (ref 3.8–10.8)

## 2016-08-09 LAB — LIPID PANEL
CHOL/HDL RATIO: 4.6 ratio (ref <5.0)
Cholesterol: 165 mg/dL (ref <200)
HDL: 36 mg/dL — AB (ref >50)
LDL Cholesterol: 89 mg/dL (ref <100)
Triglycerides: 198 mg/dL — ABNORMAL HIGH (ref <150)
VLDL: 40 mg/dL — ABNORMAL HIGH (ref <30)

## 2016-08-09 LAB — COMPREHENSIVE METABOLIC PANEL
ALT: 12 U/L (ref 6–29)
AST: 14 U/L (ref 10–35)
Albumin: 4 g/dL (ref 3.6–5.1)
Alkaline Phosphatase: 82 U/L (ref 33–130)
BILIRUBIN TOTAL: 0.5 mg/dL (ref 0.2–1.2)
BUN: 12 mg/dL (ref 7–25)
CALCIUM: 9.5 mg/dL (ref 8.6–10.4)
CHLORIDE: 104 mmol/L (ref 98–110)
CO2: 27 mmol/L (ref 20–31)
CREATININE: 1.02 mg/dL — AB (ref 0.50–0.99)
Glucose, Bld: 94 mg/dL (ref 70–99)
Potassium: 4.9 mmol/L (ref 3.5–5.3)
SODIUM: 140 mmol/L (ref 135–146)
TOTAL PROTEIN: 7.8 g/dL (ref 6.1–8.1)

## 2016-08-09 MED ORDER — TRAZODONE HCL 50 MG PO TABS: ORAL_TABLET | ORAL | 1 refills | Status: DC

## 2016-08-09 MED ORDER — HYDROCODONE-ACETAMINOPHEN 7.5-325 MG PO TABS: 1.0000 | ORAL_TABLET | Freq: Four times a day (QID) | ORAL | 0 refills | Status: DC | PRN

## 2016-08-09 MED ORDER — RANITIDINE HCL 150 MG PO TABS: 150.0000 mg | ORAL_TABLET | Freq: Every day | ORAL | 3 refills | Status: DC

## 2016-08-09 NOTE — Assessment & Plan Note (Signed)
Discussed healthy eating, her resources are limited often eats canned veggies/processed foods

## 2016-08-09 NOTE — Assessment & Plan Note (Signed)
Doing fairly well since husband passed, continue trazodone which also helps her sleep

## 2016-08-09 NOTE — Progress Notes (Signed)
Subjective:   Patient presents for Medicare Annual/Subsequent preventive examination.   Review Past Medical/Family/Social: Per EMR   Using CPAP/with oxygen    Feel cyst in center of back, painful over past month  feels well otherwise  Medications refilled Request refill on pain medicine  Was out of her trazodone helps with sleep and mood    Risk Factors  Current exercise habits: none  Dietary issues discussed: yes   Cardiac risk factors: Obesity (BMI >= 30 kg/m2).   Depression Screen  (Note: if answer to either of the following is "Yes", a more complete depression screening is indicated)  Over the past two weeks, have you felt down, depressed or hopeless? Yes  Over the past two weeks, have you felt little interest or pleasure in doing things? yes Have you lost interest or pleasure in daily life? No Do you often feel hopeless? No Do you cry easily over simple problems? No   Activities of Daily Living  In your present state of health, do you have any difficulty performing the following activities?:  Driving? No  Managing money? No  Feeding yourself? No  Getting from bed to chair? yes Climbing a flight of stairs? yes  Preparing food and eating?: No  Bathing or showering? No  Getting dressed: No  Getting to the toilet? No  Using the toilet:No  Moving around from place to place: No  In the past year have you fallen or had a near fall?:yes- due to knee  Are you sexually active? No  Do you have more than one partner? No   Hearing Difficulties: yes  Do you often ask people to speak up or repeat themselves? No  Do you experience ringing or noises in your ears? yes Do you have difficulty understanding soft or whispered voices? No  Do you feel that you have a problem with memory? No Do you often misplace items? yes Do you feel safe at home? Yes  Cognitive Testing  Alert? Yes Normal Appearance?Yes  Oriented to person? Yes Place? Yes  Time? Yes  Recall of three objects? Yes   Can perform simple calculations? Yes  Displays appropriate judgment?Yes  Can read the correct time from a watch face?Yes   List the Names of Other Physician/Practitioners you currently use:   Pulmonary - has appt Feb 7th   Screening Tests / Date Colonoscopy      UTD                Zostavax UTD Mammogram UTD  Influenza Vaccine - Flu shot due  Pneumonia- UTD  Tetanus/tdap- not covered  ROS: GEN- denies fatigue, fever, weight loss,weakness, recent illness HEENT- denies eye drainage, change in vision, nasal discharge, CVS- denies chest pain, palpitations RESP- denies SOB, cough, wheeze ABD- denies N/V, change in stools, abd pain GU- denies dysuria, hematuria, dribbling, incontinence MSK- + joint pain, muscle aches, injury Neuro- denies headache, dizziness, syncope, seizure activity  Physical: GEN- NAD, alert and oriented x3 HEENT- PERRL, EOMI, non injected sclera, pink conjunctiva, MMM, oropharynx clear Neck- Supple, no thryomegaly CVS- RRR, no murmur RESP-CTAB ABD-NABS,soft,NT,ND  Psych- normal affect and mood Skin- quarter size cyst with core at center, no erythema, no drainage  EXT- No edema Pulses- Radial, DP- 2+       Assessment:    Annual wellness medicare exam   Plan:    During the course of the visit the patient was educated and counseled about appropriate screening and preventive services include  Flu shot  Given  Screen + for depression. PHQ- 8  score of 12 (moderate depression).- see problem list   Diet review for nutrition referral? Yes ____ Not Indicated __x__  Patient Instructions (the written plan) was given to the patient.  Medicare Attestation  I have personally reviewed:  The patient's medical and social history  Their use of alcohol, tobacco or illicit drugs  Their current medications and supplements  The patient's functional ability including ADLs,fall risks, home safety risks, cognitive, and hearing and visual impairment  Diet and  physical activities  Evidence for depression or mood disorders  The patient's weight, height, BMI, and visual acuity have been recorded in the chart. I have made referrals, counseling, and provided education to the patient based on review of the above and I have provided the patient with a written personalized care plan for preventive services.

## 2016-08-09 NOTE — Assessment & Plan Note (Signed)
Recurrent cyst, send to surgery for removal

## 2016-08-09 NOTE — Patient Instructions (Addendum)
F/u 4 MONTHS  COntinue current medications  Referral to Dr. Arnoldo Morale for the cyst removal

## 2016-08-09 NOTE — Assessment & Plan Note (Signed)
Recheck A1C, labs non fasting today

## 2016-08-09 NOTE — Assessment & Plan Note (Signed)
Controlled, no change to medication Continue statin drug

## 2016-08-10 ENCOUNTER — Ambulatory Visit: Payer: Medicare Other | Admitting: Pulmonary Disease

## 2016-08-10 LAB — HEMOGLOBIN A1C
HEMOGLOBIN A1C: 5.5 % (ref <5.7)
MEAN PLASMA GLUCOSE: 111 mg/dL

## 2016-08-10 LAB — HEPATITIS C ANTIBODY: HCV AB: NEGATIVE

## 2016-08-18 ENCOUNTER — Encounter: Payer: Self-pay | Admitting: *Deleted

## 2016-08-22 DIAGNOSIS — J449 Chronic obstructive pulmonary disease, unspecified: Secondary | ICD-10-CM | POA: Diagnosis not present

## 2016-08-23 ENCOUNTER — Other Ambulatory Visit: Payer: Self-pay | Admitting: Family Medicine

## 2016-08-23 DIAGNOSIS — Z1231 Encounter for screening mammogram for malignant neoplasm of breast: Secondary | ICD-10-CM

## 2016-08-30 DIAGNOSIS — L723 Sebaceous cyst: Secondary | ICD-10-CM | POA: Diagnosis not present

## 2016-08-30 NOTE — H&P (Signed)
  NTS SOAP Note  Vital Signs:  Vitals as of: 7/0/1779: Systolic 390: Diastolic 68: Heart Rate 83: Temp 98.26F (Temporal): Height 71ft 1in: Weight 201Lbs 0 Ounces: BMI 37.98   BMI : 37.98 kg/m2  Subjective: This 66 year old female presents for of an enlarging sebaceous cyst on her back.  Referred by Dr. Buelah Manis.  It has drained in the past, seen in ER last year.  Started to swell and become tender to touch several weeks ago.  No drainage noted.  Review of Symptoms:  Constitutional:fatigue Head:negative Eyes:negative sinus problems Cardiovascular:negative Respiratory:wheezing Gastrointestinheartburn Genitourinary:negative back pain Hematolgic/Lymphatic:negative Allergic/Immunologic:negative   Past Medical History:Reviewed  Past Medical History  Surgical History: unremarkable Medical Problems: obesity, HTN, high cholesterol, asthma Allergies: nkda Medications: proventil, zyloprim, lipitor, flonase, norco, cozaar, megace, zantac, trazadone   Social History:Reviewed  Social History  Preferred Language: English Race:  Black or African American Ethnicity: Not Hispanic / Latino Age: 66 year Marital Status:  S Alcohol: no   Smoking Status: Former smoker reviewed on 08/30/2016 Started Date: 07/25/2004 Stopped Date:  Functional Status reviewed on mm/dd/yyyy ------------------------------------------------ Bathing: Normal Cooking: Normal Dressing: Normal Driving: Normal Eating: Normal Managing Meds: Normal Oral Care: Normal Shopping: Normal Toileting: Normal Transferring: Normal Walking: Normal Cognitive Status reviewed on mm/dd/yyyy ------------------------------------------------ Attention: Normal Decision Making: Normal Language: Normal Memory: Normal Motor: Normal Perception: Normal Problem Solving: Normal Visual and Spatial: Normal   Family History:Reviewed  Family Health History Family History is Unknown    Objective  Information: General:Well appearing, well nourished in no distress. 3cm sebaceous cyst with punctum present, left lower back.  No drainage noted.  Tender to touch.  No erythema noted. Head:Atraumatic; no masses; no abnormalities Neck:Supple without lymphadenopathy.  Heart:RRR, no murmur or gallop.  Normal S1, S2.  No S3, S4.  Lungs:CTA bilaterally, no wheezes, rhonchi, rales.  Breathing unlabored. [facility]berg/PA notes reviewed Assessment:Sebaceous cyst, back  Diagnoses: 706.2  L72.3 Epidermoid cyst of skin (Sebaceous cyst)  Procedures: 30092 - OFFICE OUTPATIENT NEW 20 MINUTES    Plan:  Scheduled for excision of sebaceous cyst, back on 09/12/16.   Patient Education:Alternative treatments to surgery were discussed with patient (and family).Risks and benefits  of procedure were fully explained to the patient (and family) who gave informed consent. Patient/family questions were addressed.  Follow-up:Pending Surgery

## 2016-08-31 ENCOUNTER — Encounter: Payer: Self-pay | Admitting: Pulmonary Disease

## 2016-08-31 ENCOUNTER — Ambulatory Visit (INDEPENDENT_AMBULATORY_CARE_PROVIDER_SITE_OTHER): Payer: Medicare Other | Admitting: Pulmonary Disease

## 2016-08-31 VITALS — BP 112/72 | HR 83 | Ht 65.0 in | Wt 263.8 lb

## 2016-08-31 DIAGNOSIS — K219 Gastro-esophageal reflux disease without esophagitis: Secondary | ICD-10-CM | POA: Diagnosis not present

## 2016-08-31 DIAGNOSIS — J4520 Mild intermittent asthma, uncomplicated: Secondary | ICD-10-CM

## 2016-08-31 DIAGNOSIS — G4733 Obstructive sleep apnea (adult) (pediatric): Secondary | ICD-10-CM

## 2016-08-31 DIAGNOSIS — Z9989 Dependence on other enabling machines and devices: Secondary | ICD-10-CM

## 2016-08-31 NOTE — Progress Notes (Signed)
Subjective:    Patient ID: [name], female    DOB: 1951/07/21, 66 y.o.   MRN: [id number]  C.C.:  Follow-up for Mild, Intermittent Asthma, OSA/OHS, GERD, & Mild Restrictive Lung Disease.  HPI Mild, Intermittent Asthma: Previously on Spiriva which was stopped. Spiriva previously stopped. She reports she has been coughing more frequently over the last month due to sinus congestion. Reports minimal wheezing. She reports her cough has been nonproductive. No exacerbations since last appointment. She uses her rescue inhaler 1-2 times per week.   OSA/OHS:  Currently prescribe CPAP therapy with nasal pillows. Reports she is using her CPAP religiously. She reports she needs new head gear.   GERD:  Prescribed Zantac. No morning brash water taste, reflux, or dyspepsia.   Mild Restrictive Lung Disease: Likely secondary to obesity. No need for further testing.  Review of Systems  Denies any sinus pain or pressure. No chest pain or pressure. No fever or chills.  No abdominal pain or nausea.   No Known Allergies  Current Outpatient Prescriptions on File Prior to Visit  Medication Sig Dispense Refill  . albuterol (PROVENTIL HFA;VENTOLIN HFA) 108 (90 Base) MCG/ACT inhaler Inhale 2 puffs into the lungs every 4 (four) hours as needed for wheezing or shortness of breath. 1 Inhaler 5  . allopurinol (ZYLOPRIM) 100 MG tablet Take 1 tablet (100 mg total) by mouth daily. 90 tablet 3  . aspirin EC 81 MG tablet Take 81 mg by mouth daily.    Marland Kitchen atorvastatin (LIPITOR) 10 MG tablet TAKE 1 TABLET BY MOUTH EVERY DAY 90 tablet 0  . clotrimazole-betamethasone (LOTRISONE) cream Apply 1 application topically 2 (two) times daily as needed. To be applied to the feet as needed 45 g 3  . fluticasone (CUTIVATE) 0.05 % cream APPLY EXTERNALLY TO THE AFFECTED AREA DAILY 30 g 3  . fluticasone (FLONASE) 50 MCG/ACT nasal spray Place 1 spray into both nostrils 2 (two) times daily. 16 g 3  . furosemide (LASIX) 20 MG tablet  TAKE 1 TABLET(20 MG) BY MOUTH DAILY FOR SWELLING 30 tablet 0  . HYDROcodone-acetaminophen (NORCO) 7.5-325 MG tablet Take 1 tablet by mouth every 6 (six) hours as needed for moderate pain. 45 tablet 0  . losartan (COZAAR) 50 MG tablet Take 1 tablet (50 mg total) by mouth daily. 90 tablet 3  . megestrol (MEGACE) 40 MG tablet Take 1 tablet (40 mg total) by mouth daily. 90 tablet 2  . naproxen (NAPROSYN) 500 MG tablet TAKE 1 TABLET BY MOUTH TWICE DAILY (Patient taking differently: TAKE 1 TABLET BY MOUTH TWICE DAILY as needed) 60 tablet 0  . NON FORMULARY at bedtime. CPAP and 1 lpm oxygen qhs    . ranitidine (ZANTAC) 150 MG tablet Take 1 tablet (150 mg total) by mouth at bedtime. 90 tablet 3  . traZODone (DESYREL) 50 MG tablet TAKE 1/2 TO 1 TABLET(25 TO 50 MG) BY MOUTH AT BEDTIME AS NEEDED FOR SLEEP 90 tablet 1   No current facility-administered medications on file prior to visit.     Past Medical History:  Diagnosis Date  . Achilles tendinitis   . Anxiety   . Asthma   . Depression   . GERD (gastroesophageal reflux disease)   . Gout   . HTN (hypertension)   . Hyperlipidemia   . Low back pain   . Obesity hypoventilation syndrome ([place])   . Obstructive sleep apnea   . Osteoarthritis     Past Surgical History:  Procedure Laterality Date  .  CARDIAC CATHETERIZATION  2006  . CATARACT EXTRACTION W/PHACO Left 09/17/2015   Procedure: CATARACT EXTRACTION PHACO AND INTRAOCULAR LENS PLACEMENT LEFT EYE cde=8.58;  Surgeon: Tonny Branch, MD;  Location: [facility];  Service: Ophthalmology;  Laterality: Left;  . COLONOSCOPY N/A 12/25/2013   Procedure: COLONOSCOPY;  Surgeon: Rogene Houston, MD;  Location: [facility] SUITE;  Service: Endoscopy;  Laterality: N/A;  730-rescheduled Ann notified pt  . TUBAL LIGATION      Family History  Problem Relation Age of Onset  . Heart disease Mother   . Thyroid disease Mother   . Heart failure Father   . Lung disease Father   . Diabetes Brother   . Crohn's disease  Daughter     Social History   Social History  . Marital status: Widowed    Spouse name: N/A  . Number of children: N/A  . Years of education: N/A   Occupational History  . disabled Unemployed   Social History Main Topics  . Smoking status: Former Smoker    Packs/day: 2.00    Years: 36.00    Types: Cigarettes    Start date: 06/25/1969    Quit date: 03/25/2005  . Smokeless tobacco: Never Used  . Alcohol use No  . Drug use: No  . Sexual activity: No   Other Topics Concern  . None   Social History Narrative   Originally from [place]. She has always lived in [place]. 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 No pets currently. No bird, mold, or hot tub exposure.    Objective:   Physical Exam BP 112/72 (BP Location: Right Arm, Patient Position: Sitting, Cuff Size: Normal)   Pulse 83   Ht 5\' 5"  (1.651 m)   Wt 263 lb 12.8 oz (119.7 kg)   SpO2 98%   BMI 43.90 kg/m   General:  Awake. Alert. No acute distress. He is female. Slightly malodorous. Integument:  Warm & dry. No rash on exposed skin.  Extremities:  No cyanosis or clubbing.  HEENT:  Moist mucus membranes. No oral ulcers. Poor dentition. No nasal turbinate swelling. Cardiovascular:  Regular rate. No edema. No appreciable JVD.  Pulmonary:  overall clear to auscultation and good aeration bilaterally. No accessory muscle use. Speaking in complete sentences.  Abdomen: Soft. Normal bowel sounds. Protuberant.  Musculoskeletal:  Normal bulk and tone. No joint deformity or effusion appreciated.  PFT 02/22/16: FVC 1.52 L (58%) FEV1 1.31 L (65%) FEV1/FVC 0.86 FEF 25-75 1.67 L (88%) negative bronchodilator response                                                                     DLCO corrected 79% (hemoglobin 10.0) 11/19/15: FVC 1.68 L (64%) FEV1 1.44 L (71%) FEV1/FVC 0.86 FEF 25-75 1.78 L (93%) negative bronchodilator response 08/10/15: FVC 1.78 L (68%) FEV1 1.55 L (76%) FEV1/FVC 0.87 FEF 25-75 2.05 L  (107%) negative bronchodilator response TLC 3.76 L (72%) RV 81% ERV 17% DLCO uncorrected 37% 04/10/13: FVC 1.61 L (59%) FEV1 1.14 L (66%) FEV1/FVC 0.88 FEF 25-75 2.08 L (102%) positive bronchodilator response TLC 4.10 L (77%) RV 97% ERV 43% DLCO uncorrected 57%   6MWT 08/11/15:  Walked 247.5 meters / Baseline Sat 100% on RA /  Nadir Sat 97% on RA  LABS 02/24/15 CBC: 8.0/12.5/36.9/278 BMP: 140/4.5/106/27/11/0.88/93/8.9 LFT: 3.6/6.7/0.7/88/12/26  05/12/14 ABG:  7.40 / 44 / 66 / 92%    Assessment & Plan:  65 y.o. female with mild, intermittent asthma, GERD, & OSA/OHS. Patient's mild intermittent cough is likely secondary to some mild postnasal drainage. Her asthma seems to be well-controlled at this time and therefore I feel it's results will to continue on her current inhaler regimen. She is adherent to her CPAP therapy although needs additional supplies. Reflux too seems to be well-controlled. I instructed the patient to contact my office if she needed to be seen sooner as the seasons and heat index change and possibly worsen her asthma.   1. Mild, Intermittent Asthma: Continuing albuterol inhaler as needed. No new inhalers at this time.  2. OSA/OHS: Continuing patient on CPAP. Recommended she contact her medical supply company for new headgear.  3. GERD: Well-controlled with Zantac. No changes at this time. 4. Health Maintenance:  S/P Pneumovax 14 February 2016, Prevnar April 2016 & Influenza Vaccine January 2018.  5. Follow-up: Patient will return to clinic in  6 months or sooner if needed.   Sonia Baller Ashok Cordia, M.D. [facility] Pager:  [phone] After 3pm or if no response, call [phone] 11:11 AM 08/31/16

## 2016-08-31 NOTE — Patient Instructions (Addendum)
   Continue using your medications as prescribed.  Call me if you have to use your rescue inhaler more frequently than you are now.  I will see you back in 6 months or sooner if needed.

## 2016-09-02 NOTE — Patient Instructions (Signed)
    [name]  09/02/2016     @PREFPERIOPPHARMACY @   Your procedure is scheduled on 09/12/2016.  Report to [facility] at 6:15 A.M.  Call this number if you have problems the morning of surgery:  [phone]   Remember:  Do not eat food or drink liquids after midnight.  Take these medicines the morning of surgery with A SIP OF WATER Albuterol inhaler, Allopurinol, Flonase, Hydrocodone if needed, Cozaar, Megace  Zantac   Do not wear jewelry, make-up or nail polish.  Do not wear lotions, powders, or perfumes, or deoderant.  Do not shave 48 hours prior to surgery.  Men may shave face and neck.  Do not bring valuables to the hospital.  [facility] is not responsible for any belongings or valuables.  Contacts, dentures or bridgework may not be worn into surgery.  Leave your suitcase in the car.  After surgery it may be brought to your room.  For patients admitted to the hospital, discharge time will be determined by your treatment team.  Patients discharged the day of surgery will not be allowed to drive home.    Please read over the following fact sheets that you were given. Surgical Site Infection Prevention and Anesthesia Post-op Instructions     PATIENT INSTRUCTIONS POST-ANESTHESIA  IMMEDIATELY FOLLOWING SURGERY:  Do not drive or operate machinery for the first twenty four hours after surgery.  Do not make any important decisions for twenty four hours after surgery or while taking narcotic pain medications or sedatives.  If you develop intractable nausea and vomiting or a severe headache please notify your doctor immediately.  FOLLOW-UP:  Please make an appointment with your surgeon as instructed. You do not need to follow up with anesthesia unless specifically instructed to do so.  WOUND CARE INSTRUCTIONS (if applicable):  Keep a dry clean dressing on the anesthesia/puncture wound site if there is drainage.  Once the wound has quit draining you may leave it open to air.   Generally you should leave the bandage intact for twenty four hours unless there is drainage.  If the epidural site drains for more than 36-48 hours please call the anesthesia department.  QUESTIONS?:  Please feel free to call your physician or the hospital operator if you have any questions, and they will be happy to assist you.

## 2016-09-05 ENCOUNTER — Other Ambulatory Visit: Payer: Self-pay | Admitting: Family Medicine

## 2016-09-06 ENCOUNTER — Other Ambulatory Visit: Payer: Self-pay

## 2016-09-06 ENCOUNTER — Encounter (HOSPITAL_COMMUNITY): Payer: Self-pay

## 2016-09-06 ENCOUNTER — Encounter (HOSPITAL_COMMUNITY)
Admission: RE | Admit: 2016-09-06 | Discharge: 2016-09-06 | Disposition: A | Discharge status: Home / Self Care | Payer: Medicare Other | Source: Ambulatory Visit | Attending: General Surgery | Admitting: General Surgery

## 2016-09-06 DIAGNOSIS — Z01812 Encounter for preprocedural laboratory examination: Secondary | ICD-10-CM | POA: Insufficient documentation

## 2016-09-06 DIAGNOSIS — Z01810 Encounter for preprocedural cardiovascular examination: Secondary | ICD-10-CM | POA: Insufficient documentation

## 2016-09-06 LAB — BASIC METABOLIC PANEL
ANION GAP: 9 (ref 5–15)
BUN: 11 mg/dL (ref 6–20)
CALCIUM: 9.1 mg/dL (ref 8.9–10.3)
CO2: 22 mmol/L (ref 22–32)
Chloride: 108 mmol/L (ref 101–111)
Creatinine, Ser: 0.92 mg/dL (ref 0.44–1.00)
Glucose, Bld: 106 mg/dL — ABNORMAL HIGH (ref 65–99)
Potassium: 4.6 mmol/L (ref 3.5–5.1)
Sodium: 139 mmol/L (ref 135–145)

## 2016-09-06 LAB — CBC WITH DIFFERENTIAL/PLATELET
BASOS ABS: 0 10*3/uL (ref 0.0–0.1)
BASOS PCT: 0 %
EOS ABS: 0.5 10*3/uL (ref 0.0–0.7)
EOS PCT: 6 %
HCT: 39.4 % (ref 36.0–46.0)
Hemoglobin: 13.2 g/dL (ref 12.0–15.0)
Lymphocytes Relative: 37 %
Lymphs Abs: 3.4 10*3/uL (ref 0.7–4.0)
MCH: 30.5 pg (ref 26.0–34.0)
MCHC: 33.5 g/dL (ref 30.0–36.0)
MCV: 91 fL (ref 78.0–100.0)
MONO ABS: 0.7 10*3/uL (ref 0.1–1.0)
Monocytes Relative: 8 %
Neutro Abs: 4.5 10*3/uL (ref 1.7–7.7)
Neutrophils Relative %: 49 %
PLATELETS: 270 10*3/uL (ref 150–400)
RBC: 4.33 MIL/uL (ref 3.87–5.11)
RDW: 14 % (ref 11.5–15.5)
WBC: 9.2 10*3/uL (ref 4.0–10.5)

## 2016-09-06 NOTE — Pre-Procedure Instructions (Signed)
Patient in for PAT. Asked her who would be staying with her or she would be staying with for 24 hours after surgery and she said, "No one, I am staying by myself." Discussed with patients risks of staying by herself for 24 hours after surgery at great length. She states her daughter is taking her home and dropping her off. I asked if she could stay with daughter or daughter could stay with her, and patient states' "i ain't staying down there with her and she ain't staying with me either. Explanined that along with postop risks that hospital policy is that we cannot do surgery and then send patient home by themselves. Abbie Sons, RN, Specialty Coordinator in to talk with patient and explained above information again to patient and her response was the same. Called Dr Arnoldo Morale to see if she could be done under local and he states she could not do this case under local and if patient refuses to have someone with her, she would have to be canceled. Spoke to patient and she still refuses to have someone with her and so case was canceled.

## 2016-09-06 NOTE — Pre-Procedure Instructions (Signed)
Patient came back to daysurgery at 1530 and states she has someone to stay with after surgery now. She states that Raynelle Fanning, her daughter-in-law is going to drive her to her home and she will stay there for 24 hours. Dr Arnoldo Morale notified and states it is ok to rebook the case at the same date and time. Proceeded with PAT.

## 2016-09-12 ENCOUNTER — Encounter (HOSPITAL_COMMUNITY): Payer: Self-pay | Admitting: Anesthesiology

## 2016-09-12 ENCOUNTER — Ambulatory Visit (HOSPITAL_COMMUNITY)
Admission: RE | Admit: 2016-09-12 | Discharge: 2016-09-12 | Disposition: A | Discharge status: Home / Self Care | Payer: Medicare Other | Source: Ambulatory Visit | Attending: General Surgery | Admitting: General Surgery

## 2016-09-12 ENCOUNTER — Ambulatory Visit (HOSPITAL_COMMUNITY): Payer: Medicare Other | Admitting: Anesthesiology

## 2016-09-12 ENCOUNTER — Encounter (HOSPITAL_COMMUNITY): Admission: RE | Payer: Self-pay | Source: Ambulatory Visit

## 2016-09-12 ENCOUNTER — Encounter (HOSPITAL_COMMUNITY)
Admission: RE | Disposition: A | Discharge status: Home / Self Care | Payer: Self-pay | Source: Ambulatory Visit | Attending: General Surgery

## 2016-09-12 ENCOUNTER — Ambulatory Visit (HOSPITAL_COMMUNITY): Admission: RE | Admit: 2016-09-12 | Payer: Medicare Other | Source: Ambulatory Visit | Admitting: General Surgery

## 2016-09-12 DIAGNOSIS — Z6837 Body mass index (BMI) 37.0-37.9, adult: Secondary | ICD-10-CM | POA: Diagnosis not present

## 2016-09-12 DIAGNOSIS — Z79899 Other long term (current) drug therapy: Secondary | ICD-10-CM | POA: Insufficient documentation

## 2016-09-12 DIAGNOSIS — L723 Sebaceous cyst: Secondary | ICD-10-CM | POA: Diagnosis not present

## 2016-09-12 DIAGNOSIS — K219 Gastro-esophageal reflux disease without esophagitis: Secondary | ICD-10-CM | POA: Diagnosis not present

## 2016-09-12 DIAGNOSIS — Z87891 Personal history of nicotine dependence: Secondary | ICD-10-CM | POA: Diagnosis not present

## 2016-09-12 DIAGNOSIS — I10 Essential (primary) hypertension: Secondary | ICD-10-CM | POA: Diagnosis not present

## 2016-09-12 DIAGNOSIS — E7800 Pure hypercholesterolemia, unspecified: Secondary | ICD-10-CM | POA: Insufficient documentation

## 2016-09-12 DIAGNOSIS — J449 Chronic obstructive pulmonary disease, unspecified: Secondary | ICD-10-CM | POA: Insufficient documentation

## 2016-09-12 HISTORY — PX: CYST EXCISION: SHX5701

## 2016-09-12 SURGERY — CYST REMOVAL
Anesthesia: General

## 2016-09-12 MED ORDER — FENTANYL CITRATE (PF) 100 MCG/2ML IJ SOLN
25.0000 ug | INTRAMUSCULAR | Status: AC
  Administered 2016-09-12 (×2): 25 ug via INTRAVENOUS

## 2016-09-12 MED ORDER — SODIUM CHLORIDE 0.9 % IR SOLN
Status: DC | PRN
  Administered 2016-09-12: 1000 mL

## 2016-09-12 MED ORDER — FENTANYL CITRATE (PF) 100 MCG/2ML IJ SOLN: 25.0000 ug | INTRAMUSCULAR | Status: DC | PRN

## 2016-09-12 MED ORDER — LIDOCAINE HCL (PF) 1 % IJ SOLN
INTRAMUSCULAR | Status: DC | PRN
  Administered 2016-09-12: 2 mL

## 2016-09-12 MED ORDER — LIDOCAINE HCL (PF) 1 % IJ SOLN
INTRAMUSCULAR | Status: AC
  Filled 2016-09-12: qty 30

## 2016-09-12 MED ORDER — POVIDONE-IODINE 10 % OINT PACKET
TOPICAL_OINTMENT | CUTANEOUS | Status: DC | PRN
  Administered 2016-09-12: 1 via TOPICAL

## 2016-09-12 MED ORDER — KETOROLAC TROMETHAMINE 30 MG/ML IJ SOLN
30.0000 mg | Freq: Once | INTRAMUSCULAR | Status: AC
  Administered 2016-09-12: 30 mg via INTRAVENOUS

## 2016-09-12 MED ORDER — PROPOFOL 500 MG/50ML IV EMUL
INTRAVENOUS | Status: DC | PRN
  Administered 2016-09-12: 75 ug/kg/min via INTRAVENOUS

## 2016-09-12 MED ORDER — CHLORHEXIDINE GLUCONATE CLOTH 2 % EX PADS: 6.0000 | MEDICATED_PAD | Freq: Once | CUTANEOUS | Status: DC

## 2016-09-12 MED ORDER — PROPOFOL 10 MG/ML IV BOLUS
INTRAVENOUS | Status: DC | PRN
Start: 2016-09-12 — End: 2016-09-12
  Administered 2016-09-12: 10 mg via INTRAVENOUS

## 2016-09-12 MED ORDER — POVIDONE-IODINE 10 % EX OINT
TOPICAL_OINTMENT | CUTANEOUS | Status: AC
  Filled 2016-09-12: qty 1

## 2016-09-12 MED ORDER — KETOROLAC TROMETHAMINE 30 MG/ML IJ SOLN
INTRAMUSCULAR | Status: AC
  Filled 2016-09-12: qty 1

## 2016-09-12 MED ORDER — MIDAZOLAM HCL 2 MG/2ML IJ SOLN
1.0000 mg | INTRAMUSCULAR | Status: AC
  Administered 2016-09-12: 2 mg via INTRAVENOUS

## 2016-09-12 MED ORDER — DEXTROSE 5 % IV SOLN
3.0000 g | INTRAVENOUS | Status: AC
  Administered 2016-09-12: 3 g via INTRAVENOUS
  Filled 2016-09-12: qty 3000

## 2016-09-12 MED ORDER — BUPIVACAINE HCL (PF) 0.5 % IJ SOLN
INTRAMUSCULAR | Status: AC
  Filled 2016-09-12: qty 30

## 2016-09-12 MED ORDER — LACTATED RINGERS IV SOLN
INTRAVENOUS | Status: DC
  Administered 2016-09-12: 1000 mL via INTRAVENOUS

## 2016-09-12 SURGICAL SUPPLY — 24 items
BAG HAMPER (MISCELLANEOUS) ×3 IMPLANT
CLOTH BEACON ORANGE TIMEOUT ST (SAFETY) ×3 IMPLANT
COVER LIGHT HANDLE STERIS (MISCELLANEOUS) ×6 IMPLANT
DECANTER SPIKE VIAL GLASS SM (MISCELLANEOUS) ×3 IMPLANT
ELECT REM PT RETURN 9FT ADLT (ELECTROSURGICAL) ×3
ELECTRODE REM PT RTRN 9FT ADLT (ELECTROSURGICAL) ×1 IMPLANT
GLOVE BIOGEL M 7.0 STRL (GLOVE) ×2 IMPLANT
GLOVE BIOGEL PI IND STRL 7.0 (GLOVE) ×1 IMPLANT
GLOVE BIOGEL PI INDICATOR 7.0 (GLOVE) ×4
GLOVE SURG SS PI 7.5 STRL IVOR (GLOVE) ×4 IMPLANT
GOWN STRL REUS W/TWL LRG LVL3 (GOWN DISPOSABLE) ×6 IMPLANT
KIT ROOM TURNOVER APOR (KITS) ×3 IMPLANT
MANIFOLD NEPTUNE II (INSTRUMENTS) ×3 IMPLANT
NDL HYPO 25X1 1.5 SAFETY (NEEDLE) ×1 IMPLANT
NEEDLE HYPO 25X1 1.5 SAFETY (NEEDLE) ×3 IMPLANT
NS IRRIG 1000ML POUR BTL (IV SOLUTION) ×3 IMPLANT
PACK MINOR (CUSTOM PROCEDURE TRAY) ×2 IMPLANT
PAD ARMBOARD 7.5X6 YLW CONV (MISCELLANEOUS) ×3 IMPLANT
SET BASIN LINEN APH (SET/KITS/TRAYS/PACK) ×3 IMPLANT
SPONGE GAUZE 2X2 8PLY STER LF (GAUZE/BANDAGES/DRESSINGS) ×2
SPONGE GAUZE 2X2 8PLY STRL LF (GAUZE/BANDAGES/DRESSINGS) ×2 IMPLANT
SUT PROLENE 3 0 PS 2 (SUTURE) ×4 IMPLANT
SYR CONTROL 10ML LL (SYRINGE) ×5 IMPLANT
TAPE CLOTH SURG 4X10 WHT LF (GAUZE/BANDAGES/DRESSINGS) ×2 IMPLANT

## 2016-09-12 NOTE — Transfer of Care (Signed)
Immediate Anesthesia Transfer of Care Note  Patient: [name]  Procedure(s) Performed: Procedure(s): EXCISION SEBACEOUS CYST, BACK (N/A)  Patient Location: PACU  Anesthesia Type:MAC  Level of Consciousness: awake, alert  and oriented  Airway & Oxygen Therapy: Patient Spontanous Breathing  Post-op Assessment: Report given to RN  Post vital signs: Reviewed and stable  Last Vitals:  Vitals:   09/12/16 0725 09/12/16 0730  BP:  130/80  Resp: (!) 38 (!) 29  Temp:      Last Pain: There were no vitals filed for this visit.       Complications: No apparent anesthesia complications

## 2016-09-12 NOTE — Discharge Instructions (Signed)
Epidermal Cyst Removal, Care After Introduction Refer to this sheet in the next few weeks. These instructions provide you with information about caring for yourself after your procedure. Your health care provider may also give you more specific instructions. Your treatment has been planned according to current medical practices, but problems sometimes occur. Call your health care provider if you have any problems or questions after your procedure. What can I expect after the procedure? After the procedure, it is common to have:  Soreness in the area where your cyst was removed.  Tightness or itching from your skin sutures. Follow these instructions at home:  Take medicines only as directed by your health care provider.  If you were prescribed an antibiotic medicine, finish all of it even if you start to feel better.  Use antibiotic ointment as directed by your health care provider. Follow the instructions carefully.  There are many different ways to close and cover an incision, including stitches (sutures), skin glue, and adhesive strips. Follow your health care provider's instructions about:  Incision care.  Bandage (dressing) changes and removal.  Incision closure removal.  Keep the bandage (dressing) dry until your health care provider says that it can be removed. Take sponge baths only. Ask your health care provider when you can start showering or taking a bath.  After your dressing is off, check your incision every day for signs of infection. Watch for:  Redness, swelling, or pain.  Fluid, blood, or pus.  You can return to your normal activities. Do not do anything that stretches or puts pressure on your incision.  You can return to your normal diet.  Keep all follow-up visits as directed by your health care provider. This is important. Contact a health care provider if:  You have a fever.  Your incision bleeds.  You have redness, swelling, or pain in the incision  area.  You have fluid, blood, or pus coming from your incision.  Your cyst comes back after surgery. This information is not intended to replace advice given to you by your health care provider. Make sure you discuss any questions you have with your health care provider. Document Released: 08/01/2014 Document Revised: 12/17/2015 Document Reviewed: 03/26/2014  2017 Elsevier

## 2016-09-12 NOTE — Anesthesia Preprocedure Evaluation (Addendum)
Anesthesia Evaluation  Patient identified by MRN, date of birth, ID band Patient awake    Reviewed: Allergy & Precautions, H&P , NPO status , Patient's Chart, lab work & pertinent test results  Airway Mallampati: II  TM Distance: >3 FB Neck ROM: Full    Dental  (+) Edentulous Upper, Poor Dentition   Pulmonary asthma , sleep apnea and Continuous Positive Airway Pressure Ventilation , COPD, former smoker,    Pulmonary exam normal        Cardiovascular hypertension, Pt. on medications  Rhythm:Regular Rate:Normal     Neuro/Psych PSYCHIATRIC DISORDERS Anxiety Depression  Neuromuscular disease    GI/Hepatic Neg liver ROS, GERD  Medicated and Controlled,  Endo/Other  Morbid obesity  Renal/GU negative Renal ROS     Musculoskeletal  (+) Arthritis , Osteoarthritis,    Abdominal (+) + obese,  Abdomen: soft.    Peds  Hematology negative hematology ROS (+)   Anesthesia Other Findings   Reproductive/Obstetrics                             Anesthesia Physical Anesthesia Plan  ASA: III  Anesthesia Plan: MAC   Post-op Pain Management:    Induction: Intravenous  Airway Management Planned:   Additional Equipment:   Intra-op Plan:   Post-operative Plan: Extubation in OR  Informed Consent: I have reviewed the patients History and Physical, chart, labs and discussed the procedure including the risks, benefits and alternatives for the proposed anesthesia with the patient or authorized representative who has indicated his/her understanding and acceptance.     Plan Discussed with:   Anesthesia Plan Comments:        Anesthesia Quick Evaluation

## 2016-09-12 NOTE — Anesthesia Postprocedure Evaluation (Signed)
Anesthesia Post Note  Patient: [name]  Procedure(s) Performed: Procedure(s) (LRB): EXCISION SEBACEOUS CYST, BACK (N/A)  Patient location during evaluation: PACU Anesthesia Type: MAC Level of consciousness: awake and alert and oriented Pain management: pain level controlled Vital Signs Assessment: post-procedure vital signs reviewed and stable Respiratory status: spontaneous breathing Cardiovascular status: blood pressure returned to baseline Postop Assessment: no signs of nausea or vomiting Anesthetic complications: no     Last Vitals:  Vitals:   09/12/16 0725 09/12/16 0730  BP:  130/80  Resp: (!) 38 (!) 29  Temp:      Last Pain: There were no vitals filed for this visit.               Petro Talent

## 2016-09-12 NOTE — H&P (View-Only) (Signed)
  NTS SOAP Note  Vital Signs:  Vitals as of: 0/03/8118: Systolic 147: Diastolic 68: Heart Rate 83: Temp 98.16F (Temporal): Height 68ft 1in: Weight 201Lbs 0 Ounces: BMI 37.98   BMI : 37.98 kg/m2  Subjective: This 66 year old female presents for of an enlarging sebaceous cyst on her back.  Referred by Dr. Buelah Manis.  It has drained in the past, seen in ER last year.  Started to swell and become tender to touch several weeks ago.  No drainage noted.  Review of Symptoms:  Constitutional:fatigue Head:negative Eyes:negative sinus problems Cardiovascular:negative Respiratory:wheezing Gastrointestinheartburn Genitourinary:negative back pain Hematolgic/Lymphatic:negative Allergic/Immunologic:negative   Past Medical History:Reviewed  Past Medical History  Surgical History: unremarkable Medical Problems: obesity, HTN, high cholesterol, asthma Allergies: nkda Medications: proventil, zyloprim, lipitor, flonase, norco, cozaar, megace, zantac, trazadone   Social History:Reviewed  Social History  Preferred Language: English Race:  Black or African American Ethnicity: Not Hispanic / Latino Age: 66 year Marital Status:  S Alcohol: no   Smoking Status: Former smoker reviewed on 08/30/2016 Started Date: 07/25/2004 Stopped Date:  Functional Status reviewed on mm/dd/yyyy ------------------------------------------------ Bathing: Normal Cooking: Normal Dressing: Normal Driving: Normal Eating: Normal Managing Meds: Normal Oral Care: Normal Shopping: Normal Toileting: Normal Transferring: Normal Walking: Normal Cognitive Status reviewed on mm/dd/yyyy ------------------------------------------------ Attention: Normal Decision Making: Normal Language: Normal Memory: Normal Motor: Normal Perception: Normal Problem Solving: Normal Visual and Spatial: Normal   Family History:Reviewed  Family Health History Family History is Unknown    Objective  Information: General:Well appearing, well nourished in no distress. 3cm sebaceous cyst with punctum present, left lower back.  No drainage noted.  Tender to touch.  No erythema noted. Head:Atraumatic; no masses; no abnormalities Neck:Supple without lymphadenopathy.  Heart:RRR, no murmur or gallop.  Normal S1, S2.  No S3, S4.  Lungs:CTA bilaterally, no wheezes, rhonchi, rales.  Breathing unlabored. [facility]/PA notes reviewed Assessment:Sebaceous cyst, back  Diagnoses: 706.2  L72.3 Epidermoid cyst of skin (Sebaceous cyst)  Procedures: 82956 - OFFICE OUTPATIENT NEW 20 MINUTES    Plan:  Scheduled for excision of sebaceous cyst, back on 09/12/16.   Patient Education:Alternative treatments to surgery were discussed with patient (and family).Risks and benefits  of procedure were fully explained to the patient (and family) who gave informed consent. Patient/family questions were addressed.  Follow-up:Pending Surgery

## 2016-09-12 NOTE — Interval H&P Note (Signed)
History and Physical Interval Note:  09/12/2016 7:11 AM  [name]  has presented today for surgery, with the diagnosis of sebaceous cyst on back  The various methods of treatment have been discussed with the patient and family. After consideration of risks, benefits and other options for treatment, the patient has consented to  Procedure(s): EXCISION SEBACEOUS CYST, BACK (N/A) as a surgical intervention .  The patient's history has been reviewed, patient examined, no change in status, stable for surgery.  I have reviewed the patient's chart and labs.  Questions were answered to the patient's satisfaction.     Aviva Signs A

## 2016-09-12 NOTE — Op Note (Signed)
Patient:  PAISLEY GRAJEDA  DOB:  Jan 01, 1951  MRN:  [id number]   Preop Diagnosis:  Sebaceous cyst, back  Postop Diagnosis:  Same  Procedure:  Excision of 3 cm sebaceous cyst, back  Surgeon:  Aviva Signs, M.D.  Anes:  Mac  Indications:  Patient is a 66 year old black female who presents with a recurrent sebaceous cyst on her back which has become tender and enlarging in size. The risks and benefits of the procedure including bleeding, infection, and recurrence of the cyst were fully explained to the patient, who gave informed consent.  Procedure note:  The patient was placed in the right lateral decubitus position after monitored anesthesia care was given. The back was prepped and draped using usual sterile technique with DuraPrep. Surgical site confirmation was performed. One percent Xylocaine was used for local anesthesia.  The cyst was located just to the left of the lower midline of the back. An elliptical incision was made around the punctum. The sebaceous cyst was removed in total without difficulty. The was disposed of. The bleeding was controlled using Bovie electrocautery. The wound was irrigated with normal saline. The incision was closed using 3-0 nylon interrupted sutures. Betadine ointment and a dry sterile dressing were applied.  All tape and needle counts were correct at the end of the procedure. The patient was transferred to PACU in stable condition.  Complications:  None  EBL:  Minimal  Specimen:  None

## 2016-09-13 ENCOUNTER — Telehealth: Payer: Self-pay | Admitting: Family Medicine

## 2016-09-13 MED ORDER — HYDROCODONE-ACETAMINOPHEN 7.5-325 MG PO TABS: 1.0000 | ORAL_TABLET | Freq: Four times a day (QID) | ORAL | 0 refills | Status: DC | PRN

## 2016-09-13 NOTE — Telephone Encounter (Signed)
Patient asking for rx for her hydrocodone  [phone]

## 2016-09-13 NOTE — Telephone Encounter (Signed)
Ok to refill??  Last office visit/ refill 08/09/2016.

## 2016-09-13 NOTE — Telephone Encounter (Signed)
Prescription printed.   Call placed to patient. No answer. No VM. 

## 2016-09-13 NOTE — Telephone Encounter (Signed)
Okay to refill? 

## 2016-09-14 ENCOUNTER — Encounter (HOSPITAL_COMMUNITY): Payer: Self-pay | Admitting: General Surgery

## 2016-09-14 NOTE — Telephone Encounter (Signed)
Patient in office and picked up prescription.

## 2016-09-21 DIAGNOSIS — J449 Chronic obstructive pulmonary disease, unspecified: Secondary | ICD-10-CM | POA: Diagnosis not present

## 2016-09-22 ENCOUNTER — Encounter (HOSPITAL_COMMUNITY): Payer: Self-pay | Admitting: General Surgery

## 2016-09-27 ENCOUNTER — Ambulatory Visit (INDEPENDENT_AMBULATORY_CARE_PROVIDER_SITE_OTHER): Payer: Medicare Other | Admitting: General Surgery

## 2016-09-27 ENCOUNTER — Encounter: Payer: Self-pay | Admitting: General Surgery

## 2016-09-27 VITALS — BP 189/65 | HR 83 | Temp 97.1°F | Resp 20 | Ht 65.0 in | Wt 266.0 lb

## 2016-09-27 DIAGNOSIS — Z09 Encounter for follow-up examination after completed treatment for conditions other than malignant neoplasm: Secondary | ICD-10-CM

## 2016-09-27 NOTE — Progress Notes (Signed)
Subjective:     [name]  No complaints, status post excision of sebaceous cyst, back. No fever or chills. No drainage from wound. Objective:    BP (!) 189/65   Pulse 83   Temp 97.1 F (36.2 C)   Resp 20   Ht 5\' 5"  (1.651 m)   Wt 266 lb (120.7 kg)   BMI 44.26 kg/m   General:  alert, cooperative and no distress  Back incision healing well. Sutures removed. No drainage or erythema noted.     Assessment:    Doing well postoperatively.    Plan:  Continue normal activity.

## 2016-10-03 ENCOUNTER — Ambulatory Visit (HOSPITAL_COMMUNITY)
Admission: RE | Admit: 2016-10-03 | Discharge: 2016-10-03 | Disposition: A | Discharge status: Home / Self Care | Payer: Medicare Other | Source: Ambulatory Visit | Attending: Family Medicine | Admitting: Family Medicine

## 2016-10-03 DIAGNOSIS — Z1231 Encounter for screening mammogram for malignant neoplasm of breast: Secondary | ICD-10-CM | POA: Insufficient documentation

## 2016-10-04 ENCOUNTER — Other Ambulatory Visit: Payer: Self-pay | Admitting: Family Medicine

## 2016-10-12 ENCOUNTER — Telehealth: Payer: Self-pay | Admitting: Family Medicine

## 2016-10-12 ENCOUNTER — Other Ambulatory Visit: Payer: Self-pay | Admitting: Family Medicine

## 2016-10-12 MED ORDER — HYDROCODONE-ACETAMINOPHEN 7.5-325 MG PO TABS: 1.0000 | ORAL_TABLET | Freq: Four times a day (QID) | ORAL | 0 refills | Status: DC | PRN

## 2016-10-12 NOTE — Telephone Encounter (Signed)
Ok to refill??  Last office visit 08/09/2016.  Last refill 09/13/2016.

## 2016-10-12 NOTE — Telephone Encounter (Signed)
Patient calling for rx hydrocodone

## 2016-10-12 NOTE — Telephone Encounter (Signed)
Prescription printed and patient made aware to come to office to pick up after 2pm on 10/12/2016.

## 2016-10-12 NOTE — Telephone Encounter (Signed)
okay

## 2016-10-20 DIAGNOSIS — J449 Chronic obstructive pulmonary disease, unspecified: Secondary | ICD-10-CM | POA: Diagnosis not present

## 2016-11-03 ENCOUNTER — Other Ambulatory Visit: Payer: Self-pay | Admitting: Family Medicine

## 2016-11-14 ENCOUNTER — Telehealth: Payer: Self-pay | Admitting: Family Medicine

## 2016-11-14 MED ORDER — HYDROCODONE-ACETAMINOPHEN 7.5-325 MG PO TABS: 1.0000 | ORAL_TABLET | Freq: Four times a day (QID) | ORAL | 0 refills | Status: DC | PRN

## 2016-11-14 NOTE — Telephone Encounter (Signed)
okay

## 2016-11-14 NOTE — Telephone Encounter (Signed)
Asking for rx for hydrocodone  [phone]

## 2016-11-14 NOTE — Telephone Encounter (Signed)
Ok to refill??  Last office visit 08/09/2016.  Last refill 10/12/2016.

## 2016-11-14 NOTE — Telephone Encounter (Signed)
Prescription printed.   Call placed to patient. No answer. No VM. 

## 2016-11-15 NOTE — Telephone Encounter (Signed)
Call placed to patient. No answer. No VM.  

## 2016-11-20 DIAGNOSIS — J449 Chronic obstructive pulmonary disease, unspecified: Secondary | ICD-10-CM | POA: Diagnosis not present

## 2016-11-21 ENCOUNTER — Encounter: Payer: Self-pay | Admitting: Family Medicine

## 2016-12-05 ENCOUNTER — Other Ambulatory Visit: Payer: Self-pay | Admitting: Family Medicine

## 2016-12-07 ENCOUNTER — Encounter: Payer: Self-pay | Admitting: Family Medicine

## 2016-12-07 ENCOUNTER — Ambulatory Visit (INDEPENDENT_AMBULATORY_CARE_PROVIDER_SITE_OTHER): Payer: Medicare Other | Admitting: Family Medicine

## 2016-12-07 VITALS — BP 142/82 | HR 86 | Temp 98.0°F | Resp 16 | Ht 65.0 in | Wt 273.0 lb

## 2016-12-07 DIAGNOSIS — M8949 Other hypertrophic osteoarthropathy, multiple sites: Secondary | ICD-10-CM

## 2016-12-07 DIAGNOSIS — R7302 Impaired glucose tolerance (oral): Secondary | ICD-10-CM | POA: Diagnosis not present

## 2016-12-07 DIAGNOSIS — E7800 Pure hypercholesterolemia, unspecified: Secondary | ICD-10-CM | POA: Diagnosis not present

## 2016-12-07 DIAGNOSIS — I10 Essential (primary) hypertension: Secondary | ICD-10-CM

## 2016-12-07 DIAGNOSIS — M159 Polyosteoarthritis, unspecified: Secondary | ICD-10-CM

## 2016-12-07 DIAGNOSIS — R609 Edema, unspecified: Secondary | ICD-10-CM

## 2016-12-07 DIAGNOSIS — M150 Primary generalized (osteo)arthritis: Secondary | ICD-10-CM

## 2016-12-07 MED ORDER — LOSARTAN POTASSIUM 50 MG PO TABS: 50.0000 mg | ORAL_TABLET | Freq: Every day | ORAL | 3 refills | Status: DC

## 2016-12-07 MED ORDER — FUROSEMIDE 20 MG PO TABS: ORAL_TABLET | ORAL | 2 refills | Status: DC

## 2016-12-07 MED ORDER — ATORVASTATIN CALCIUM 10 MG PO TABS: 10.0000 mg | ORAL_TABLET | Freq: Every day | ORAL | 2 refills | Status: DC

## 2016-12-07 MED ORDER — TRAZODONE HCL 50 MG PO TABS: ORAL_TABLET | ORAL | 2 refills | Status: DC

## 2016-12-07 MED ORDER — HYDROCODONE-ACETAMINOPHEN 7.5-325 MG PO TABS: 1.0000 | ORAL_TABLET | Freq: Four times a day (QID) | ORAL | 0 refills | Status: DC | PRN

## 2016-12-07 NOTE — Patient Instructions (Signed)
F/U 4 months  

## 2016-12-07 NOTE — Assessment & Plan Note (Signed)
Blood pressure looks okay today. No change in her medication. She will continue her Lasix daily as well for her peripheral edema. We discussed her diet as she is gaining weight. She has glucose intolerance. We'll recheck her labs the next visit. We discussed cutting out the sweets and the snacks and try to get herself more active and walking around outside.

## 2016-12-07 NOTE — Assessment & Plan Note (Signed)
Multiple joints involved. I refilled her hydrocodone no sign of any abuse or misuse

## 2016-12-07 NOTE — Progress Notes (Signed)
   Subjective:    Patient ID: [name], female    DOB: [birth year], 66 y.o.   MRN: [id number]  Patient presents for 4 month F/U (is not fasting) Patient to follow chronic medical problems. Medications reviewed. Since her last visit she had cyst removed by general surgery no sign of any malignancy.  Planning for cataract of right eye within the next few months   A1C has been normal for past 8 months, she is mornbidly obese, has gained 10lbs in the past 4 months ,has been eating of lots   Peripheral edema- takes her lasix dialy, needs refill  Chronic pain- continues to rake tylenol and norco as needed for her arthritis    Review Of Systems:  GEN- denies fatigue, fever, weight loss,weakness, recent illness HEENT- denies eye drainage, change in vision, nasal discharge, CVS- denies chest pain, palpitations RESP- denies SOB, cough, wheeze ABD- denies N/V, change in stools, abd pain GU- denies dysuria, hematuria, dribbling, incontinence MSK-+ joint pain, muscle aches, injury Neuro- denies headache, dizziness, syncope, seizure activity       Objective:    BP (!) 142/82   Pulse 86   Temp 98 F (36.7 C) (Oral)   Resp 16   Ht 5\' 5"  (1.651 m)   Wt 273 lb (123.8 kg)   SpO2 98%   BMI 45.43 kg/m  GEN- NAD, alert and oriented x3 HEENT- PERRL, EOMI, non injected sclera, pink conjunctiva, MMM, oropharynx clear Neck- Supple, no thyromegaly CVS- RRR, no murmur RESP-CTAB ABD-NABS,soft,NT,ND EXT- No edema Pulses- Radial, DP- 2+        Assessment & Plan:      Problem List Items Addressed This Visit    Peripheral edema   Osteoarthritis    Multiple joints involved. I refilled her hydrocodone no sign of any abuse or misuse      Relevant Medications   HYDROcodone-acetaminophen (NORCO) 7.5-325 MG tablet   Morbid obesity (HCC) - Primary   Hyperlipidemia   Relevant Medications   losartan (COZAAR) 50 MG tablet   furosemide (LASIX) 20 MG tablet   atorvastatin (LIPITOR) 10  MG tablet   Glucose intolerance (impaired glucose tolerance)   Essential hypertension    Blood pressure looks okay today. No change in her medication. She will continue her Lasix daily as well for her peripheral edema. We discussed her diet as she is gaining weight. She has glucose intolerance. We'll recheck her labs the next visit. We discussed cutting out the sweets and the snacks and try to get herself more active and walking around outside.      Relevant Medications   losartan (COZAAR) 50 MG tablet   furosemide (LASIX) 20 MG tablet   atorvastatin (LIPITOR) 10 MG tablet      Note: This dictation was prepared with Dragon dictation along with smaller phrase technology. Any transcriptional errors that result from this process are unintentional.

## 2016-12-16 ENCOUNTER — Other Ambulatory Visit: Payer: Self-pay | Admitting: Family Medicine

## 2016-12-20 DIAGNOSIS — J449 Chronic obstructive pulmonary disease, unspecified: Secondary | ICD-10-CM | POA: Diagnosis not present

## 2016-12-21 DIAGNOSIS — H25811 Combined forms of age-related cataract, right eye: Secondary | ICD-10-CM | POA: Diagnosis not present

## 2017-01-10 ENCOUNTER — Telehealth: Payer: Self-pay | Admitting: Family Medicine

## 2017-01-10 MED ORDER — HYDROCODONE-ACETAMINOPHEN 7.5-325 MG PO TABS: 1.0000 | ORAL_TABLET | Freq: Four times a day (QID) | ORAL | 0 refills | Status: DC | PRN

## 2017-01-10 NOTE — Addendum Note (Signed)
Addended by: Sheral Flow on: 01/10/2017 03:26 PM   Modules accepted: Orders

## 2017-01-10 NOTE — Telephone Encounter (Signed)
Pt needs refill on hydrocodone.  °

## 2017-01-10 NOTE — Telephone Encounter (Signed)
Ok to refill??  Last office visit/ refill 12/07/2016.

## 2017-01-10 NOTE — Telephone Encounter (Signed)
okay

## 2017-01-11 NOTE — Telephone Encounter (Signed)
Prescription printed and patient made aware to come to office to pick up on 01/11/2017.

## 2017-01-20 DIAGNOSIS — J449 Chronic obstructive pulmonary disease, unspecified: Secondary | ICD-10-CM | POA: Diagnosis not present

## 2017-02-10 ENCOUNTER — Telehealth: Payer: Self-pay | Admitting: Family Medicine

## 2017-02-10 MED ORDER — HYDROCODONE-ACETAMINOPHEN 7.5-325 MG PO TABS: 1.0000 | ORAL_TABLET | Freq: Four times a day (QID) | ORAL | 0 refills | Status: DC | PRN

## 2017-02-10 NOTE — Telephone Encounter (Signed)
Prescription printed and patient made aware to come to office to pick up on 02/13/2017.

## 2017-02-10 NOTE — Telephone Encounter (Signed)
Pt needs refill on hydrocodone.  °

## 2017-02-10 NOTE — Telephone Encounter (Signed)
Okay to refill? 

## 2017-02-10 NOTE — Telephone Encounter (Signed)
Ok to refill??  Last office visit 12/07/2016.  Last refill 01/10/2017.

## 2017-02-19 DIAGNOSIS — J449 Chronic obstructive pulmonary disease, unspecified: Secondary | ICD-10-CM | POA: Diagnosis not present

## 2017-02-23 DIAGNOSIS — H4902 Third [oculomotor] nerve palsy, left eye: Secondary | ICD-10-CM | POA: Diagnosis not present

## 2017-02-23 DIAGNOSIS — H532 Diplopia: Secondary | ICD-10-CM | POA: Diagnosis not present

## 2017-02-24 ENCOUNTER — Emergency Department (HOSPITAL_COMMUNITY): Payer: Medicare Other

## 2017-02-24 ENCOUNTER — Emergency Department (HOSPITAL_COMMUNITY)
Admission: EM | Admit: 2017-02-24 | Discharge: 2017-02-24 | Disposition: A | Discharge status: Home / Self Care | Payer: Medicare Other | Attending: Emergency Medicine | Admitting: Emergency Medicine

## 2017-02-24 ENCOUNTER — Encounter (HOSPITAL_COMMUNITY): Payer: Self-pay | Admitting: Emergency Medicine

## 2017-02-24 DIAGNOSIS — H538 Other visual disturbances: Secondary | ICD-10-CM | POA: Insufficient documentation

## 2017-02-24 DIAGNOSIS — Z79899 Other long term (current) drug therapy: Secondary | ICD-10-CM | POA: Diagnosis not present

## 2017-02-24 DIAGNOSIS — I10 Essential (primary) hypertension: Secondary | ICD-10-CM | POA: Diagnosis not present

## 2017-02-24 DIAGNOSIS — H579 Unspecified disorder of eye and adnexa: Secondary | ICD-10-CM | POA: Diagnosis not present

## 2017-02-24 DIAGNOSIS — Z87891 Personal history of nicotine dependence: Secondary | ICD-10-CM | POA: Diagnosis not present

## 2017-02-24 DIAGNOSIS — J45909 Unspecified asthma, uncomplicated: Secondary | ICD-10-CM | POA: Diagnosis not present

## 2017-02-24 DIAGNOSIS — H02402 Unspecified ptosis of left eyelid: Secondary | ICD-10-CM

## 2017-02-24 DIAGNOSIS — Z7982 Long term (current) use of aspirin: Secondary | ICD-10-CM | POA: Diagnosis not present

## 2017-02-24 LAB — COMPREHENSIVE METABOLIC PANEL
ALK PHOS: 71 U/L (ref 38–126)
ALT: 12 U/L — AB (ref 14–54)
AST: 25 U/L (ref 15–41)
Albumin: 3.5 g/dL (ref 3.5–5.0)
Anion gap: 10 (ref 5–15)
BILIRUBIN TOTAL: 1.2 mg/dL (ref 0.3–1.2)
BUN: 10 mg/dL (ref 6–20)
CALCIUM: 9.1 mg/dL (ref 8.9–10.3)
CO2: 24 mmol/L (ref 22–32)
CREATININE: 1.19 mg/dL — AB (ref 0.44–1.00)
Chloride: 106 mmol/L (ref 101–111)
GFR, EST AFRICAN AMERICAN: 54 mL/min — AB (ref >60)
GFR, EST NON AFRICAN AMERICAN: 46 mL/min — AB (ref >60)
Glucose, Bld: 123 mg/dL — ABNORMAL HIGH (ref 65–99)
Potassium: 4.1 mmol/L (ref 3.5–5.1)
Sodium: 140 mmol/L (ref 135–145)
TOTAL PROTEIN: 7.4 g/dL (ref 6.5–8.1)

## 2017-02-24 LAB — CBC WITH DIFFERENTIAL/PLATELET
Basophils Absolute: 0.1 10*3/uL (ref 0.0–0.1)
Basophils Relative: 1 %
Eosinophils Absolute: 0.5 10*3/uL (ref 0.0–0.7)
Eosinophils Relative: 6 %
HEMATOCRIT: 35.2 % — AB (ref 36.0–46.0)
Hemoglobin: 12.2 g/dL (ref 12.0–15.0)
LYMPHS PCT: 29 %
Lymphs Abs: 2.2 10*3/uL (ref 0.7–4.0)
MCH: 30.9 pg (ref 26.0–34.0)
MCHC: 34.7 g/dL (ref 30.0–36.0)
MCV: 89.1 fL (ref 78.0–100.0)
MONO ABS: 0.6 10*3/uL (ref 0.1–1.0)
MONOS PCT: 8 %
NEUTROS ABS: 4.2 10*3/uL (ref 1.7–7.7)
Neutrophils Relative %: 56 %
Platelets: 284 10*3/uL (ref 150–400)
RBC: 3.95 MIL/uL (ref 3.87–5.11)
RDW: 14.3 % (ref 11.5–15.5)
WBC: 7.5 10*3/uL (ref 4.0–10.5)

## 2017-02-24 LAB — TROPONIN I

## 2017-02-24 MED ORDER — GADOBENATE DIMEGLUMINE 529 MG/ML IV SOLN
20.0000 mL | Freq: Once | INTRAVENOUS | Status: AC | PRN
  Administered 2017-02-24: 20 mL via INTRAVENOUS

## 2017-02-24 NOTE — ED Notes (Signed)
Pt states she has had an improvement in vision in the L eye since being here. States vision is blurred.

## 2017-02-24 NOTE — ED Provider Notes (Signed)
[facility] DEPT Provider Note   CSN: [id number] Arrival date & time: 02/24/17  0904     History   Chief Complaint Chief Complaint  Patient presents with  . Blurred Vision    HPI [name] is a 66 y.o. female.  HPI  Pt was seen at 0925.  Per pt, c/o gradual onset and persistence of constant left eyelids "drooping" for the past 1 week. Has been associated with left eye blurred vision. Pt states she is able to open her left eyelids, "but they just shut closed again." Pt was evaluated at her Ophthalmologist yesterday and told to come to the ED for "a MRI because I might have had a stroke." Denies dysphagia, no facial droop, no slurred speech, no focal motor weakness, no tingling/numbness in extremities, no CP/SOB, no abd pain, no N/V/D, no fevers, no rash, no injury.    Past Medical History:  Diagnosis Date  . Achilles tendinitis   . Anxiety   . Asthma   . Depression   . GERD (gastroesophageal reflux disease)   . Gout   . HTN (hypertension)   . Hyperlipidemia   . Low back pain   . Obesity hypoventilation syndrome ([place])   . Obstructive sleep apnea   . Osteoarthritis     Patient Active Problem List   Diagnosis Date Noted  . Peripheral edema 02/10/2016  . Asthma 05/14/2015  . Heel spur 02/24/2015  . Depression 02/24/2015  . Post-menopausal bleeding 06/02/2014  . OA (osteoarthritis) of knee 04/09/2014  . Routine general medical examination at a health care facility 11/19/2013  . Epidermal cyst 10/01/2013  . Morbid obesity ([place]) 02/25/2013  . Back pain 03/04/2012  . Gout 01/25/2012  . Glucose intolerance (impaired glucose tolerance) 04/01/2011  . Edema 01/28/2011  . OBESITY HYPOVENTILATION SYNDROME 06/16/2008  . OBSTRUCTIVE SLEEP APNEA 05/08/2008  . Hyperlipidemia 08/28/2006  . Essential hypertension 08/28/2006  . GERD 08/28/2006  . Osteoarthritis 08/28/2006    Past Surgical History:  Procedure Laterality Date  . CARDIAC CATHETERIZATION  2006  .  CATARACT EXTRACTION W/PHACO Left 09/17/2015   Procedure: CATARACT EXTRACTION PHACO AND INTRAOCULAR LENS PLACEMENT LEFT EYE cde=8.58;  Surgeon: Tonny Branch, MD;  Location: [facility] ORS;  Service: Ophthalmology;  Laterality: Left;  . COLONOSCOPY N/A 12/25/2013   Procedure: COLONOSCOPY;  Surgeon: Rogene Houston, MD;  Location: [facility] ENDO SUITE;  Service: Endoscopy;  Laterality: N/A;  730-rescheduled Ann notified pt  . CYST EXCISION N/A 09/12/2016   Procedure: EXCISION SEBACEOUS CYST, BACK;  Surgeon: Aviva Signs, MD;  Location: [facility] ORS;  Service: General;  Laterality: N/A;  . TUBAL LIGATION      OB History    Gravida Para Term Preterm AB Living   3 3 3     3    SAB TAB Ectopic Multiple Live Births                   Home Medications    Prior to Admission medications   Medication Sig Start Date End Date Taking? Authorizing Provider  acetaminophen (TYLENOL) 325 MG tablet Take 650 mg by mouth every 6 (six) hours as needed for mild pain.    [provider]  albuterol (PROVENTIL HFA;VENTOLIN HFA) 108 (90 Base) MCG/ACT inhaler Inhale 2 puffs into the lungs every 4 (four) hours as needed for wheezing or shortness of breath. 05/10/16   Javier Glazier, MD  allopurinol (ZYLOPRIM) 100 MG tablet Take 1 tablet (100 mg total) by mouth daily. 03/18/16   Faison,  Modena Nunnery, MD  aspirin EC 81 MG tablet Take 81 mg by mouth daily.    [provider]  atorvastatin (LIPITOR) 10 MG tablet Take 1 tablet (10 mg total) by mouth daily. 12/07/16   Wright, Modena Nunnery, MD  clotrimazole-betamethasone (LOTRISONE) cream Apply 1 application topically 2 (two) times daily as needed. To be applied to the feet as needed 02/26/14   Vic Blackbird F, MD  fluticasone (CUTIVATE) 0.05 % cream APPLY EXTERNALLY TO THE AFFECTED AREA DAILY 03/16/15   Alycia Rossetti, MD  fluticasone [facility]) 50 MCG/ACT nasal spray Place 1 spray into both nostrils 2 (two) times daily. Patient taking differently: Place 1 spray into both nostrils 2  (two) times daily as needed for allergies.  08/11/15   Javier Glazier, MD  furosemide (LASIX) 20 MG tablet TAKE 1 TABLET(20 MG) BY MOUTH DAILY FOR SWELLING 12/07/16   Greenwood, Modena Nunnery, MD  HYDROcodone-acetaminophen [facility]) 7.5-325 MG tablet Take 1 tablet by mouth every 6 (six) hours as needed for moderate pain. 02/10/17   Alycia Rossetti, MD  losartan (COZAAR) 50 MG tablet Take 1 tablet (50 mg total) by mouth daily. 12/07/16   Alycia Rossetti, MD  megestrol (MEGACE) 40 MG tablet TAKE 1 TABLET(40 MG) BY MOUTH DAILY 12/16/16   Alycia Rossetti, MD  NON FORMULARY at bedtime. CPAP and 1 lpm oxygen qhs    [provider]  ranitidine (ZANTAC) 150 MG tablet Take 1 tablet (150 mg total) by mouth at bedtime. 08/09/16   Alycia Rossetti, MD  traZODone (DESYREL) 50 MG tablet TAKE 1/2 TO 1 TABLET(25 TO 50 MG) BY MOUTH AT BEDTIME AS NEEDED FOR SLEEP 12/07/16   Alycia Rossetti, MD    Family History Family History  Problem Relation Age of Onset  . Heart disease Mother   . Thyroid disease Mother   . Heart failure Father   . Lung disease Father   . Diabetes Brother   . Crohn's disease Daughter     Social History Social History  Substance Use Topics  . Smoking status: Former Smoker    Packs/day: 2.00    Years: 36.00    Types: Cigarettes    Start date: 06/25/1969    Quit date: 03/25/2005  . Smokeless tobacco: Never Used  . Alcohol use No     Allergies   Patient has no known allergies.   Review of Systems Review of Systems ROS: Statement: All systems negative except as marked or noted in the HPI; Constitutional: Negative for fever and chills. ; ; Eyes: Negative for eye pain, redness and discharge. ; ; ENMT: Negative for ear pain, hoarseness, nasal congestion, sinus pressure and sore throat. ; ; Cardiovascular: Negative for chest pain, palpitations, diaphoresis, dyspnea and peripheral edema. ; ; Respiratory: Negative for cough, wheezing and stridor. ; ; Gastrointestinal: Negative for  nausea, vomiting, diarrhea, abdominal pain, blood in stool, hematemesis, jaundice and rectal bleeding. . ; ; Genitourinary: Negative for dysuria, flank pain and hematuria. ; ; Musculoskeletal: Negative for back pain and neck pain. Negative for swelling and trauma.; ; Skin: Negative for pruritus, rash, abrasions, blisters, bruising and skin lesion.; ; Neuro: +left eyelids drooping, left eye blurred vision. Negative for headache, lightheadedness and neck stiffness. Negative for weakness, altered level of consciousness, altered mental status, extremity weakness, paresthesias, involuntary movement, seizure and syncope.       Physical Exam Updated Vital Signs BP (!) 150/71 (BP Location: Right Arm)   Pulse 80   Temp  98.4 F (36.9 C) (Oral)   Resp 20   Ht 5\' 5"  (1.651 m)   Wt 123.8 kg (273 lb)   SpO2 97%   BMI 45.43 kg/m   Physical Exam 0930: Physical examination:  Nursing notes reviewed; Vital signs and O2 SAT reviewed;  Constitutional: Well developed, Well nourished, Well hydrated, In no acute distress; Head:  Normocephalic, atraumatic; Eyes: EOMI, PERRL, No scleral icterus. No obvious hyphema or hypopyon.; ENMT: Mouth and pharynx normal, Mucous membranes moist; Neck: Supple, Full range of motion, No lymphadenopathy; Cardiovascular: Regular rate and rhythm, No gallop; Respiratory: Breath sounds clear & equal bilaterally, No wheezes.  Speaking full sentences with ease, Normal respiratory effort/excursion; Chest: Nontender, Movement normal; Abdomen: Soft, Nontender, Nondistended, Normal bowel sounds; Genitourinary: No CVA tenderness; Extremities: Pulses normal, No tenderness, No edema, No calf edema or asymmetry.; Neuro: AA&Ox3, +left ptosis, no edema, no erythema. Pt is able to open her left eyelids when asked. Major CN grossly intact. No facial droop. Speech clear. No gross focal motor or sensory deficits in extremities.; Skin: Color normal, Warm, Dry.   ED Treatments / Results  Labs (all labs  ordered are listed, but only abnormal results are displayed)   EKG  EKG Interpretation  Date/Time:  Friday February 24 2017 09:19:39 EDT Ventricular Rate:  79 PR Interval:    QRS Duration: 99 QT Interval:  361 QTC Calculation: 414 R Axis:   39 Text Interpretation:  Sinus rhythm Borderline T abnormalities, diffuse leads Baseline wander When compared with ECG of 09/06/2016 No significant change was found Confirmed by Francine Graven [phone]) on 02/24/2017 9:54:49 AM       Radiology   Procedures Procedures (including critical care time)  Medications Ordered in ED Medications - No data to display   Initial Impression / Assessment and Plan / ED Course  I have reviewed the triage vital signs and the nursing notes.  Pertinent labs & imaging results that were available during my care of the patient were reviewed by me and considered in my medical decision making (see chart for details).  MDM Reviewed: previous chart, vitals and nursing note Reviewed previous: labs and ECG Interpretation: labs, ECG, x-ray and MRI   Results for orders placed or performed during the hospital encounter of 02/24/17  CBC with Differential  Result Value Ref Range   WBC 7.5 4.0 - 10.5 K/uL   RBC 3.95 3.87 - 5.11 MIL/uL   Hemoglobin 12.2 12.0 - 15.0 g/dL   HCT 35.2 (L) 36.0 - 46.0 %   MCV 89.1 78.0 - 100.0 fL   MCH 30.9 26.0 - 34.0 pg   MCHC 34.7 30.0 - 36.0 g/dL   RDW 14.3 11.5 - 15.5 %   Platelets 284 150 - 400 K/uL   Neutrophils Relative % 56 %   Neutro Abs 4.2 1.7 - 7.7 K/uL   Lymphocytes Relative 29 %   Lymphs Abs 2.2 0.7 - 4.0 K/uL   Monocytes Relative 8 %   Monocytes Absolute 0.6 0.1 - 1.0 K/uL   Eosinophils Relative 6 %   Eosinophils Absolute 0.5 0.0 - 0.7 K/uL   Basophils Relative 1 %   Basophils Absolute 0.1 0.0 - 0.1 K/uL  Troponin I  Result Value Ref Range   Troponin I <0.03 <0.03 ng/mL  Comprehensive metabolic panel  Result Value Ref Range   Sodium 140 135 - 145 mmol/L    Potassium 4.1 3.5 - 5.1 mmol/L   Chloride 106 101 - 111 mmol/L   CO2 24  22 - 32 mmol/L   Glucose, Bld 123 (H) 65 - 99 mg/dL   BUN 10 6 - 20 mg/dL   Creatinine, Ser 1.19 (H) 0.44 - 1.00 mg/dL   Calcium 9.1 8.9 - 10.3 mg/dL   Total Protein 7.4 6.5 - 8.1 g/dL   Albumin 3.5 3.5 - 5.0 g/dL   AST 25 15 - 41 U/L   ALT 12 (L) 14 - 54 U/L   Alkaline Phosphatase 71 38 - 126 U/L   Total Bilirubin 1.2 0.3 - 1.2 mg/dL   GFR calc non Af Amer 46 (L) >60 mL/min   GFR calc Af Amer 54 (L) >60 mL/min   Anion gap 10 5 - 15   Dg Chest 2 View Result Date: 02/24/2017 CLINICAL DATA:  66 year old female with abnormal left eye vision and left eyelid drooping for 1 week. EXAM: CHEST  2 VIEW COMPARISON:  Chest radiographs 03/18/2008. FINDINGS: Mildly low lung volumes, but similar to 2009. Cardiac size at the upper limits of normal. Other mediastinal contours are within normal limits. Visualized tracheal air column is within normal limits. No pneumothorax, pleural effusion or confluent pulmonary opacity. No acute osseous abnormality identified. Negative visible bowel gas pattern. IMPRESSION: Negative.  No acute cardiopulmonary abnormality. Electronically Signed   By: Genevie Ann M.D.   On: 02/24/2017 10:08   Mr Brain W And Wo Contrast Result Date: 02/24/2017 CLINICAL DATA:  "Disorder of eyelid". Blurry vision in the left eye for 1 week. Reportedly recently told there was a stroke in the eye. Patient presents the ER for MRI. EXAM: MRI HEAD AND ORBITS WITHOUT AND WITH CONTRAST TECHNIQUE: Multiplanar, multiecho pulse sequences of the brain and surrounding structures were obtained without and with intravenous contrast. Multiplanar, multiecho pulse sequences of the orbits and surrounding structures were obtained including fat saturation techniques, before and after intravenous contrast administration. CONTRAST:  5mL MULTIHANCE GADOBENATE DIMEGLUMINE 529 MG/ML IV SOLN COMPARISON:  02/06/2012 FINDINGS: MRI HEAD FINDINGS Brain: No acute  infarction, hemorrhage, hydrocephalus, extra-axial collection or mass lesion. No findings along the optic pathways to explain the history. Clear suprasellar cistern. Normal brain volume. Few FLAIR hyperintensities in the cerebral white matter, often seen in asymptomatic patients of this age. Vascular: Major flow voids are preserved, including the left ICA. No abnormal enhancement in the cavernous sinus region. Skull and upper cervical spine: Negative for marrow lesion. MRI ORBITS FINDINGS Orbits: No masslike or inflammatory finding. Globes, optic nerves, orbital fat, extraocular muscles, vascular structures, and lacrimal glands are negative. Status post cataract resection on the left. Visualized sinuses: Clear Soft tissues: Negative. Limited intracranial: Clear suprasellar cistern. Normal position of the optic chiasm. No evidence of mass or inflammation at the optic canals. IMPRESSION: No explanation for symptoms. No infarct, compressive lesion, or neuritis findings. Electronically Signed   By: Monte Fantasia M.D.   On: 02/24/2017 12:11   Mr Rosealee Albee OQ Contrast Result Date: 02/24/2017 CLINICAL DATA:  "Disorder of eyelid". Blurry vision in the left eye for 1 week. Reportedly recently told there was a stroke in the eye. Patient presents the ER for MRI. EXAM: MRI HEAD AND ORBITS WITHOUT AND WITH CONTRAST TECHNIQUE: Multiplanar, multiecho pulse sequences of the brain and surrounding structures were obtained without and with intravenous contrast. Multiplanar, multiecho pulse sequences of the orbits and surrounding structures were obtained including fat saturation techniques, before and after intravenous contrast administration. CONTRAST:  26mL MULTIHANCE GADOBENATE DIMEGLUMINE 529 MG/ML IV SOLN COMPARISON:  02/06/2012 FINDINGS: MRI HEAD FINDINGS Brain: No acute  infarction, hemorrhage, hydrocephalus, extra-axial collection or mass lesion. No findings along the optic pathways to explain the history. Clear suprasellar  cistern. Normal brain volume. Few FLAIR hyperintensities in the cerebral white matter, often seen in asymptomatic patients of this age. Vascular: Major flow voids are preserved, including the left ICA. No abnormal enhancement in the cavernous sinus region. Skull and upper cervical spine: Negative for marrow lesion. MRI ORBITS FINDINGS Orbits: No masslike or inflammatory finding. Globes, optic nerves, orbital fat, extraocular muscles, vascular structures, and lacrimal glands are negative. Status post cataract resection on the left. Visualized sinuses: Clear Soft tissues: Negative. Limited intracranial: Clear suprasellar cistern. Normal position of the optic chiasm. No evidence of mass or inflammation at the optic canals. IMPRESSION: No explanation for symptoms. No infarct, compressive lesion, or neuritis findings. Electronically Signed   By: Monte Fantasia M.D.   On: 02/24/2017 12:11    1220:  Ophtho MD requested MRI brain and orbits; both reassuring. T/C to Ophtho Dr. Alanda Slim, case discussed, including:  HPI, pertinent PM/SHx, VS/PE, dx testing, ED course and treatment:  MRI reassuring, states OK to d/c pt, have her call the office and he will order further blood work to check for myasthenia gravis. Dx and testing, as well as d/w Ophtho MD, d/w pt.  Questions answered.  Verb understanding, agreeable to d/c home with outpt f/u.     Final Clinical Impressions(s) / ED Diagnoses   Final diagnoses:  None    New Prescriptions New Prescriptions   No medications on file     Francine Graven, DO 02/27/17 1400

## 2017-02-24 NOTE — ED Notes (Signed)
Pt in MRI at this time 

## 2017-02-24 NOTE — Discharge Instructions (Signed)
Take your usual prescriptions as previously directed.  Call your Ophthalmology doctor today to schedule a follow up appointment within the next 3 days.  Return to the Emergency Department immediately sooner if worsening.

## 2017-02-24 NOTE — ED Triage Notes (Signed)
Patient complaining of blurry vision from left eye x 1 week. States she went to Dr Alanda Slim yesterday and was told "I might have had a stroke in my eye so he wanted me to come to the ER today and get and MRI." Denies pain. States she had cataract surgery to left eye last year.

## 2017-02-27 DIAGNOSIS — G7000 Myasthenia gravis without (acute) exacerbation: Secondary | ICD-10-CM | POA: Diagnosis not present

## 2017-02-27 DIAGNOSIS — H4902 Third [oculomotor] nerve palsy, left eye: Secondary | ICD-10-CM | POA: Diagnosis not present

## 2017-02-27 DIAGNOSIS — H532 Diplopia: Secondary | ICD-10-CM | POA: Diagnosis not present

## 2017-03-01 DIAGNOSIS — H532 Diplopia: Secondary | ICD-10-CM | POA: Diagnosis not present

## 2017-03-13 ENCOUNTER — Telehealth: Payer: Self-pay | Admitting: Family Medicine

## 2017-03-13 MED ORDER — HYDROCODONE-ACETAMINOPHEN 7.5-325 MG PO TABS: 1.0000 | ORAL_TABLET | Freq: Four times a day (QID) | ORAL | 0 refills | Status: DC | PRN

## 2017-03-13 NOTE — Telephone Encounter (Signed)
Okay to refill? 

## 2017-03-13 NOTE — Telephone Encounter (Signed)
Ok to refill Hydrocodone??  Last office visit 12/07/2016.  Last refill 02/10/2017.

## 2017-03-13 NOTE — Telephone Encounter (Signed)
Prescription printed and patient made aware to come to office to pick up after 2pm on 03/13/2017.

## 2017-03-13 NOTE — Telephone Encounter (Signed)
New Message   *STAT* If patient is at the pharmacy, call can be transferred to refill team.   1. Which medications need to be refilled? (please list name of each medication and dose if known)  hydrocodeone 7.5-325 mg tablet every 6 hours for pain as needed  2. Which pharmacy/location (including street and city if local pharmacy) is medication to be sent to? [facility] on [address] in [place], [place]  3. Do they need a 30 day or 90 day supply? 30 day supply

## 2017-03-14 NOTE — Progress Notes (Signed)
Subjective:    Patient ID: [name], female    DOB: [birth year], 66 y.o.   MRN: [id number]  C.C.:  Follow-up for Mild, Intermittent Asthma, OSA/OHS, GERD, & Mild Restrictive Lung Disease.  HPI Mild, intermittent asthma: Previously stopped Spiriva. Prescribed an albuterol inhaler to use as needed. At last appointment patient was using her rescue inhaler 1-2 times per week. She reports her breathing is doing "good". Intermittent coughing spells but nothing that is persistent. She has had some wheezing occasionally. She used her rescued inhaler yesterday but cannot recall the time prior to that. No exacerbations since last appointment.   OSA/OHS: Prescribed CPAP therapy with nasal pillows. She reports she is compliant with her CPAP. Reports good quality of sleep. She is sleeping from 10p to 8a.   GERD: Prescribed Zantac. No reflux or dyspepsia. No morning brash water taste. Compliant with medication.   Mild Restrictive Lung Disease: Likely secondary to obesity. No need for further testing.  Review of Systems  She reports she does have occasional chest tightness. No chest pain or pressure. No abdominal pain, nausea or emesis. No palpitations. She has had some near syncope at times. No rashes or bruising. Reports some eye swelling and redness on the left but none in the right. Reports she doesn't really have problems with vision out of her left eye.  No Known Allergies  Current Outpatient Prescriptions on File Prior to Visit  Medication Sig Dispense Refill  . acetaminophen (TYLENOL) 325 MG tablet Take 650 mg by mouth every 6 (six) hours as needed for mild pain.    Marland Kitchen albuterol (PROVENTIL HFA;VENTOLIN HFA) 108 (90 Base) MCG/ACT inhaler Inhale 2 puffs into the lungs every 4 (four) hours as needed for wheezing or shortness of breath. 1 Inhaler 5  . allopurinol (ZYLOPRIM) 100 MG tablet Take 1 tablet (100 mg total) by mouth daily. 90 tablet 3  . aspirin EC 81 MG tablet Take 81 mg by mouth  daily.    Marland Kitchen atorvastatin (LIPITOR) 10 MG tablet Take 1 tablet (10 mg total) by mouth daily. 90 tablet 2  . clotrimazole-betamethasone (LOTRISONE) cream Apply 1 application topically 2 (two) times daily as needed. To be applied to the feet as needed 45 g 3  . diphenhydrAMINE (BENADRYL) 25 MG tablet Take 25 mg by mouth daily.    . fluticasone (CUTIVATE) 0.05 % cream APPLY EXTERNALLY TO THE AFFECTED AREA DAILY 30 g 3  . fluticasone (FLONASE) 50 MCG/ACT nasal spray Place 1 spray into both nostrils 2 (two) times daily. (Patient taking differently: Place 1 spray into both nostrils 2 (two) times daily as needed for allergies. ) 16 g 3  . furosemide (LASIX) 20 MG tablet TAKE 1 TABLET(20 MG) BY MOUTH DAILY FOR SWELLING 90 tablet 2  . HYDROcodone-acetaminophen (NORCO) 7.5-325 MG tablet Take 1 tablet by mouth every 6 (six) hours as needed for moderate pain. 45 tablet 0  . losartan (COZAAR) 50 MG tablet Take 1 tablet (50 mg total) by mouth daily. 90 tablet 3  . megestrol (MEGACE) 40 MG tablet TAKE 1 TABLET(40 MG) BY MOUTH DAILY 90 tablet 0  . NON FORMULARY at bedtime. CPAP and 1 lpm oxygen qhs    . ranitidine (ZANTAC) 150 MG tablet Take 1 tablet (150 mg total) by mouth at bedtime. 90 tablet 3  . traZODone (DESYREL) 50 MG tablet TAKE 1/2 TO 1 TABLET(25 TO 50 MG) BY MOUTH AT BEDTIME AS NEEDED FOR SLEEP 90 tablet 2   No current facility-administered  medications on file prior to visit.     Past Medical History:  Diagnosis Date  . Achilles tendinitis   . Anxiety   . Asthma   . Depression   . GERD (gastroesophageal reflux disease)   . Gout   . HTN (hypertension)   . Hyperlipidemia   . Low back pain   . Obesity hypoventilation syndrome ([place])   . Obstructive sleep apnea   . Osteoarthritis     Past Surgical History:  Procedure Laterality Date  . CARDIAC CATHETERIZATION  2006  . CATARACT EXTRACTION W/PHACO Left 09/17/2015   Procedure: CATARACT EXTRACTION PHACO AND INTRAOCULAR LENS PLACEMENT LEFT EYE  cde=8.58;  Surgeon: Tonny Branch, MD;  Location: [facility];  Service: Ophthalmology;  Laterality: Left;  . COLONOSCOPY N/A 12/25/2013   Procedure: COLONOSCOPY;  Surgeon: Rogene Houston, MD;  Location: [facility];  Service: Endoscopy;  Laterality: N/A;  730-rescheduled Ann notified pt  . CYST EXCISION N/A 09/12/2016   Procedure: EXCISION SEBACEOUS CYST, BACK;  Surgeon: Aviva Signs, MD;  Location: [facility];  Service: General;  Laterality: N/A;  . TUBAL LIGATION      Family History  Problem Relation Age of Onset  . Heart disease Mother   . Thyroid disease Mother   . Heart failure Father   . Lung disease Father   . Diabetes Brother   . Crohn's disease Daughter     Social History   Social History  . Marital status: Widowed    Spouse name: N/A  . Number of children: N/A  . Years of education: N/A   Occupational History  . disabled Unemployed   Social History Main Topics  . Smoking status: Former Smoker    Packs/day: 2.00    Years: 36.00    Types: Cigarettes    Start date: 06/25/1969    Quit date: 03/25/2005  . Smokeless tobacco: Never Used  . Alcohol use No  . Drug use: No  . Sexual activity: No   Other Topics Concern  . None   Social History Narrative   Originally from [place]. She has always lived in [place]. 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 No pets currently. No bird, mold, or hot tub exposure.    Objective:   Physical Exam BP 136/74 (BP Location: Left Arm, Cuff Size: Large)   Pulse 65   Ht 5\' 5"  (1.651 m)   Wt 268 lb (121.6 kg)   SpO2 97%   BMI 44.60 kg/m   General:  Obese. No distress. Alert. Integument:  Warm & dry. No rash on exposed skin. No bruising on exposed skin. Extremities:  No cyanosis or clubbing.  HEENT:  Moist mucus membranes. No oral ulcers. Minimal nasal turbinate swelling. Patch over left eye. Cardiovascular:  Regular rate. No edema. Normal S1 & S2.  Pulmonary:  Clear to auscultation. Good aeration bilaterally. No  accessory muscle use on room air. Abdomen: Soft. Normal bowel sounds. Protuberant.  Musculoskeletal:  Normal bulk and tone. No joint deformity or effusion appreciated.  PFT 02/22/16: FVC 1.52 L (58%) FEV1 1.31 L (65%) FEV1/FVC 0.86 FEF 25-75 1.67 L (88%) negative bronchodilator response  DLCO corrected 79% (hemoglobin 10.0) 11/19/15: FVC 1.68 L (64%) FEV1 1.44 L (71%) FEV1/FVC 0.86 FEF 25-75 1.78 L (93%) negative bronchodilator response 08/10/15: FVC 1.78 L (68%) FEV1 1.55 L (76%) FEV1/FVC 0.87 FEF 25-75 2.05 L (107%) negative bronchodilator response TLC 3.76 L (72%) RV 81% ERV 17% DLCO uncorrected 37% 04/10/13: FVC 1.61 L (59%) FEV1 1.14 L (66%) FEV1/FVC 0.88 FEF 25-75 2.08 L (102%) positive bronchodilator response TLC 4.10 L (77%) RV 97% ERV 43% DLCO uncorrected 57%   6MWT 08/11/15:  Walked 247.5 meters / Baseline Sat 100% on RA / Nadir Sat 97% on RA  IMAGING CXR PA/LAT 02/24/17 (personally reviewed by me):  Chronic elevation of left hemidiaphragm compared with 2009. No new focal opacity or mass appreciated. No pleural effusion. Heart normal in size & mediastinum normal in contour.  LABS 02/24/15 CBC: 8.0/12.5/36.9/278 BMP: 140/4.5/106/27/11/0.88/93/8.9 LFT: 3.6/6.7/0.7/88/12/26  05/12/14 ABG:  7.40 / 44 / 66 / 92%    Assessment & Plan:  66 y.o. female with mild intermittent asthma, OSA/OHS, & GERD. Patient's asthma seems to be well-controlled at this time. She has good sleep quality utilizing her CPAP therapy and good control of her reflux on Zantac. The etiology for her vision problem is unclear but per her report her physician feels that the same process affecting her left eye could cause lung problems. She has no other symptoms that would suggest sarcoidosis and her recent blood work was normal. I reviewed her chest x-ray which showed no new parenchymal disease process. I instructed the patient to make sure that her  ophthalmologist note and blood work performed was faxed to my office for review. I instructed her to contact me if she had any new breathing problems or questions before her next appointment.  1. Mild, intermittent asthma:  Continuing albuterol inhaler as needed.  2. OSA/OHS:  Continuing CPAP therapy indefinitely.  3. GERD:  Continuing Zantac. Well controlled.  4. Health maintenance: Status post Pneumovax 14 February 2016& Prevnar April 2016. 5. Follow-up: Return to clinic in 4 weeks or sooner if needed.   Sonia Baller Ashok Cordia, M.D. [facility] Pager:  [phone] After 3pm or if no response, call [phone] 10:05 AM 03/16/17

## 2017-03-15 ENCOUNTER — Other Ambulatory Visit: Payer: Self-pay | Admitting: Family Medicine

## 2017-03-16 ENCOUNTER — Encounter: Payer: Self-pay | Admitting: Pulmonary Disease

## 2017-03-16 ENCOUNTER — Ambulatory Visit (INDEPENDENT_AMBULATORY_CARE_PROVIDER_SITE_OTHER): Payer: Medicare Other | Admitting: Pulmonary Disease

## 2017-03-16 VITALS — BP 136/74 | HR 65 | Ht 65.0 in | Wt 268.0 lb

## 2017-03-16 DIAGNOSIS — K219 Gastro-esophageal reflux disease without esophagitis: Secondary | ICD-10-CM

## 2017-03-16 DIAGNOSIS — Z9989 Dependence on other enabling machines and devices: Secondary | ICD-10-CM | POA: Diagnosis not present

## 2017-03-16 DIAGNOSIS — G4733 Obstructive sleep apnea (adult) (pediatric): Secondary | ICD-10-CM

## 2017-03-16 DIAGNOSIS — J4520 Mild intermittent asthma, uncomplicated: Secondary | ICD-10-CM

## 2017-03-16 NOTE — Patient Instructions (Addendum)
   Continue using your medications as prescribed.  Please make sure your eye doctor faxes me over a copy of your paperwork & his note.  Call me if you have any questions or concerns.

## 2017-03-20 DIAGNOSIS — G7000 Myasthenia gravis without (acute) exacerbation: Secondary | ICD-10-CM | POA: Diagnosis not present

## 2017-03-21 ENCOUNTER — Other Ambulatory Visit: Payer: Self-pay | Admitting: Family Medicine

## 2017-03-21 ENCOUNTER — Telehealth: Payer: Self-pay | Admitting: Pulmonary Disease

## 2017-03-21 DIAGNOSIS — G4733 Obstructive sleep apnea (adult) (pediatric): Secondary | ICD-10-CM

## 2017-03-21 NOTE — Telephone Encounter (Signed)
Pt's cpap broke last night.  [name] has evaluated this and because her machine is over 68 years old, she needs an order from our office for a new cpap machine.    JN ok to order?  Thanks!

## 2017-03-21 NOTE — Telephone Encounter (Signed)
That's fine. Please prescribe an Auto-CPAP with minimum pressure 5 cm H2O & maximum pressure 20cm H2O. She will need a prescription for nasal pillows with heated & humidified tubing as well as supplies as needed.

## 2017-03-21 NOTE — Telephone Encounter (Signed)
Called and spoke with pt and she is aware of order that has been placed with [facility].  Nothing further is needed.

## 2017-03-22 ENCOUNTER — Ambulatory Visit (INDEPENDENT_AMBULATORY_CARE_PROVIDER_SITE_OTHER): Payer: Medicare Other | Admitting: Diagnostic Neuroimaging

## 2017-03-22 ENCOUNTER — Encounter: Payer: Self-pay | Admitting: Diagnostic Neuroimaging

## 2017-03-22 VITALS — BP 144/67 | HR 84 | Ht 65.0 in | Wt 269.0 lb

## 2017-03-22 DIAGNOSIS — H02402 Unspecified ptosis of left eyelid: Secondary | ICD-10-CM | POA: Diagnosis not present

## 2017-03-22 DIAGNOSIS — H532 Diplopia: Secondary | ICD-10-CM

## 2017-03-22 DIAGNOSIS — G7000 Myasthenia gravis without (acute) exacerbation: Secondary | ICD-10-CM | POA: Diagnosis not present

## 2017-03-22 DIAGNOSIS — J449 Chronic obstructive pulmonary disease, unspecified: Secondary | ICD-10-CM | POA: Diagnosis not present

## 2017-03-22 MED ORDER — PYRIDOSTIGMINE BROMIDE 60 MG PO TABS: 30.0000 mg | ORAL_TABLET | Freq: Three times a day (TID) | ORAL | 6 refills | Status: DC

## 2017-03-22 NOTE — Progress Notes (Signed)
[facility]  PATIENT: [name] DOB: [birth year]  REFERRING CLINICIAN: Mincey HISTORY FROM: patient  REASON FOR VISIT: new consult    HISTORICAL  CHIEF COMPLAINT:  Chief Complaint  Patient presents with  . Diplopia    rm 7, New Pt, dgtr- Carla, "noticed vision issues x 6 wks that began with redness in white of left eye"    HISTORY OF PRESENT ILLNESS:   66 year old female with hypertension, hypercholesterolemia, anxiety, here for evaluation of double vision.  6 weeks ago patient had sudden onset of redness in both eyes. She then developed double vision and left eyelid drooping. She went to eye doctor, then referred to emergency room for MRI testing to rule out stroke. MRI of the brain and orbits were negative for acute process. Patient then had myasthenia gravis antibody panel testing which was notable for borderline positive anti-striation autoantibodies. Patient for here for further evaluation and management.  Patient also reports a number of other symptoms including fatigue, swelling in legs, wheezing, headache, numbness, weakness, slurred speech. However these symptoms do not correlate with the recent 6 weeks of double vision and left eyelid weakness.  Patient has been using an eye patch due to her double vision.  Symptoms have been fairly consistent since onset. However later in her conversation patient states that symptoms fluctuate and are worse in the evening.    REVIEW OF SYSTEMS: Full 14 system review of systems performed and negative with exception of: As per history of present illness.  ALLERGIES: No Known Allergies  HOME MEDICATIONS: Outpatient Medications Prior to Visit  Medication Sig Dispense Refill  . acetaminophen (TYLENOL) 325 MG tablet Take 650 mg by mouth every 6 (six) hours as needed for mild pain.    Marland Kitchen albuterol (PROVENTIL HFA;VENTOLIN HFA) 108 (90 Base) MCG/ACT inhaler Inhale 2 puffs into the lungs every 4 (four) hours as  needed for wheezing or shortness of breath. 1 Inhaler 5  . allopurinol (ZYLOPRIM) 100 MG tablet TAKE 1 TABLET BY MOUTH DAILY 90 tablet 0  . aspirin EC 81 MG tablet Take 81 mg by mouth daily.    Marland Kitchen atorvastatin (LIPITOR) 10 MG tablet Take 1 tablet (10 mg total) by mouth daily. 90 tablet 2  . clotrimazole-betamethasone (LOTRISONE) cream Apply 1 application topically 2 (two) times daily as needed. To be applied to the feet as needed 45 g 3  . diphenhydrAMINE (BENADRYL) 25 MG tablet Take 25 mg by mouth daily.    . fluticasone (CUTIVATE) 0.05 % cream APPLY EXTERNALLY TO THE AFFECTED AREA DAILY 30 g 3  . fluticasone (FLONASE) 50 MCG/ACT nasal spray Place 1 spray into both nostrils 2 (two) times daily. (Patient taking differently: Place 1 spray into both nostrils 2 (two) times daily as needed for allergies. ) 16 g 3  . furosemide (LASIX) 20 MG tablet TAKE 1 TABLET(20 MG) BY MOUTH DAILY FOR SWELLING 90 tablet 2  . HYDROcodone-acetaminophen (NORCO) 7.5-325 MG tablet Take 1 tablet by mouth every 6 (six) hours as needed for moderate pain. 45 tablet 0  . losartan (COZAAR) 50 MG tablet Take 1 tablet (50 mg total) by mouth daily. 90 tablet 3  . megestrol (MEGACE) 40 MG tablet TAKE 1 TABLET(40 MG) BY MOUTH DAILY 90 tablet 0  . NON FORMULARY at bedtime. CPAP and 1 lpm oxygen qhs    . ranitidine (ZANTAC) 150 MG tablet Take 1 tablet (150 mg total) by mouth at bedtime. 90 tablet 3  . traZODone (DESYREL) 50 MG tablet  TAKE 1/2 TO 1 TABLET(25 TO 50 MG) BY MOUTH AT BEDTIME AS NEEDED FOR SLEEP 90 tablet 2   No facility-administered medications prior to visit.     PAST MEDICAL HISTORY: Past Medical History:  Diagnosis Date  . Achilles tendinitis   . Anxiety   . Asthma   . Depression   . GERD (gastroesophageal reflux disease)   . Gout   . HTN (hypertension)   . Hyperlipidemia   . Low back pain   . Obesity hypoventilation syndrome ([place])   . Obstructive sleep apnea   . Osteoarthritis     PAST SURGICAL  HISTORY: Past Surgical History:  Procedure Laterality Date  . CARDIAC CATHETERIZATION  2006  . CATARACT EXTRACTION W/PHACO Left 09/17/2015   Procedure: CATARACT EXTRACTION PHACO AND INTRAOCULAR LENS PLACEMENT LEFT EYE cde=8.58;  Surgeon: Tonny Branch, MD;  Location: [facility];  Service: Ophthalmology;  Laterality: Left;  . COLONOSCOPY N/A 12/25/2013   Procedure: COLONOSCOPY;  Surgeon: Rogene Houston, MD;  Location: [facility];  Service: Endoscopy;  Laterality: N/A;  730-rescheduled Ann notified pt  . CYST EXCISION N/A 09/12/2016   Procedure: EXCISION SEBACEOUS CYST, BACK;  Surgeon: Aviva Signs, MD;  Location: [facility];  Service: General;  Laterality: N/A;  . TUBAL LIGATION      FAMILY HISTORY: Family History  Problem Relation Age of Onset  . Heart disease Mother   . Thyroid disease Mother   . Heart failure Father   . Lung disease Father   . Diabetes Brother   . Crohn's disease Daughter     SOCIAL HISTORY:  Social History   Social History  . Marital status: Widowed    Spouse name: N/A  . Number of children: 3  . Years of education: 14   Occupational History  . disabled Unemployed   Social History Main Topics  . Smoking status: Former Smoker    Packs/day: 2.00    Years: 36.00    Types: Cigarettes    Start date: 06/25/1969    Quit date: 03/25/2005  . Smokeless tobacco: Never Used  . Alcohol use No  . Drug use: No  . Sexual activity: No   Other Topics Concern  . Not on file   Social History Narrative   Originally from [place]. She has always lived in [place]. 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 No pets currently. No bird, mold, or hot tub exposure.    Lives alone   No caffeine     PHYSICAL EXAM  GENERAL EXAM/CONSTITUTIONAL: Vitals:  Vitals:   03/22/17 1521  BP: (!) 144/67  Pulse: 84  Weight: 269 lb (122 kg)  Height: 5\' 5"  (1.651 m)     Body mass index is 44.76 kg/m.  Visual Acuity Screening   Right eye Left eye Both eyes    Without correction: 20/50    With correction:     Comments: 03/22/17 unable to see w/left eye    Patient is in no distress; well developed, nourished and groomed; neck is supple  POOR DENTITION  CARDIOVASCULAR:  Examination of carotid arteries is normal; no carotid bruits  Regular rate and rhythm, no murmurs  Examination of peripheral vascular system by observation and palpation is normal  EYES:  Ophthalmoscopic exam of optic discs and posterior segments is normal; no papilledema or hemorrhages  MUSCULOSKELETAL:  Gait, strength, tone, movements noted in Neurologic exam below  NEUROLOGIC: MENTAL STATUS:  No flowsheet data found.  awake, alert, oriented to  person, place and time  recent and remote memory intact  normal attention and concentration  language fluent, comprehension intact, naming intact,   fund of knowledge appropriate  CRANIAL NERVE:   2nd - no papilledema on fundoscopic exam  2nd, 3rd, 4th, 6th - pupils equal and reactive to light, visual fields full to confrontation, extraocular muscles intact, no nystagmus; LEFT PTOSIS --> GREATLY IMPROVES WITH ICE PACK TEST FOR A FEW MINUTES; SUBJECTIVE DIPLOPIA ON RIGHT GAZE  5th - facial sensation symmetric  7th - facial strength symmetric  8th - hearing intact  9th - palate elevates symmetrically, uvula midline  11th - shoulder shrug symmetric  12th - tongue protrusion midline  MOTOR:   normal bulk and tone, full strength in the BUE, BLE  SENSORY:   normal and symmetric to light touch, temperature, vibration  COORDINATION:   finger-nose-finger, fine finger movements normal  REFLEXES:   deep tendon reflexes present and symmetric  GAIT/STATION:   narrow based gait    DIAGNOSTIC DATA (LABS, IMAGING, TESTING) - I reviewed patient records, labs, notes, testing and imaging myself where available.  Lab Results  Component Value Date   WBC 7.5 02/24/2017   HGB 12.2 02/24/2017   HCT 35.2  (L) 02/24/2017   MCV 89.1 02/24/2017   PLT 284 02/24/2017      Component Value Date/Time   NA 140 02/24/2017 0938   K 4.1 02/24/2017 0938   CL 106 02/24/2017 0938   CO2 24 02/24/2017 0938   GLUCOSE 123 (H) 02/24/2017 0938   BUN 10 02/24/2017 0938   CREATININE 1.19 (H) 02/24/2017 0938   CREATININE 1.02 (H) 08/09/2016 1102   CALCIUM 9.1 02/24/2017 0938   PROT 7.4 02/24/2017 0938   ALBUMIN 3.5 02/24/2017 0938   AST 25 02/24/2017 0938   ALT 12 (L) 02/24/2017 0938   ALKPHOS 71 02/24/2017 0938   BILITOT 1.2 02/24/2017 0938   GFRNONAA 46 (L) 02/24/2017 0938   GFRAA 54 (L) 02/24/2017 0938   Lab Results  Component Value Date   CHOL 165 08/09/2016   HDL 36 (L) 08/09/2016   LDLCALC 89 08/09/2016   LDLDIRECT 157 (H) 02/25/2013   TRIG 198 (H) 08/09/2016   CHOLHDL 4.6 08/09/2016   Lab Results  Component Value Date   HGBA1C 5.5 08/09/2016   Lab Results  Component Value Date   VITAMINB12 450 01/25/2012   Lab Results  Component Value Date   TSH 1.766 01/25/2012    02/24/17 MRI brain / orbits [I reviewed images myself and agree with interpretation. -VRP]  - No explanation for symptoms. No infarct, compressive lesion, or neuritis findings.  03/01/17 labs - anti AchR binding, blocking, modulation - normal - anti striational ab - 1:40 (h)    ASSESSMENT AND PLAN  66 y.o. year old female here with new onset of double vision left eyelid ptosis, with positive ice pack test in office today, and slightly positive anti-striation all antibody testing. Findings consistent with ocular myasthenia gravis.   Dx:  1. Binocular vision disorder with diplopia   2. Ptosis of left eyelid   3. Ocular myasthenia (HCC)      PLAN:  - trial of pyridostigmine 30-60MG  twice a day - three times a day; depending on symptom control and whether generalized symptoms develop, then we may consider additional immunosuppressant therapy (cellcept, imuran, prednisone) - check CT chest to rule out  thymoma  Orders Placed This Encounter  Procedures  . CT CHEST W CONTRAST   Meds ordered this  encounter  Medications  . pyridostigmine (MESTINON) 60 MG tablet    Sig: Take 0.5-1 tablets (30-60 mg total) by mouth 3 (three) times daily.    Dispense:  90 tablet    Refill:  6   Return in about 3 months (around 06/22/2017).    Penni Bombard, MD 7/68/1157, 2:62 PM Certified in Neurology, Neurophysiology and Neuroimaging  [facility] [address] [place], [place] [address] [phone]

## 2017-03-22 NOTE — Patient Instructions (Addendum)
Thank you for coming to see [place] at [facility]. I hope we have been able to provide you high quality care today.  You may receive a patient satisfaction survey over the next few weeks. We would appreciate your feedback and comments so that we may continue to improve ourselves and the health of our patients.   - try pyridostigmine 30-65m twice a day - three times a day  - check CT chest to rule out thymoma   ~~~~~~~~~~~~~~~~~~~~~~~~~~~~~~~~~~~~~~~~~~~~~~~~~~~~~~~~~~~~~~~~~  DR. Lular Letson'S GUIDE TO HAPPY AND HEALTHY LIVING These are some of my general health and wellness recommendations. Some of them may apply to you better than others. Please use common sense as you try these suggestions and feel free to ask me any questions.   ACTIVITY/FITNESS Mental, social, emotional and physical stimulation are very important for brain and body health. Try learning a new activity (arts, music, language, sports, games).  Keep moving your body to the best of your abilities. You can do this at home, inside or outside, the park, community center, gym or anywhere you like. Consider a physical therapist or personal trainer to get started. Consider the app Sworkit. Fitness trackers such as smart-watches, smart-phones or Fitbits can help as well.   NUTRITION Eat more plants: colorful vegetables, nuts, seeds and berries.  Eat less sugar, salt, preservatives and processed foods.  Avoid toxins such as cigarettes and alcohol.  Drink water when you are thirsty. Warm water with a slice of lemon is an excellent morning drink to start the day.  Consider these websites for more information The Nutrition Source (hhttps://www.henry-hernandez.biz/ Prec[employer] Nutrition (wWindowBlog.ch   RELAXATION Consider practicing mindfulness meditation or other relaxation techniques such as deep breathing, prayer, yoga, tai chi, massage. See website mindful.org or  the apps Headspace or Calm to help get started.   SLEEP Try to get at least 7-8+ hours sleep per day. Regular exercise and reduced caffeine will help you sleep better. Practice good sleep hygeine techniques. See website sleep.org for more information.   PLANNING Prepare estate planning, living will, healthcare POA documents. Sometimes this is best planned with the help of an attorney. Theconversationproject.org and agingwithdignity.org are excellent resources.

## 2017-03-23 ENCOUNTER — Telehealth: Payer: Self-pay | Admitting: Diagnostic Neuroimaging

## 2017-03-23 NOTE — Telephone Encounter (Signed)
Patient is scheduled to have CT Chest done at [facility] on 04/06/17 arrival time is 8:45. Liquids only 4 hours prior. Patient is aware of time and day. [employer] medicare authorization [place] via [facility] answer phone.

## 2017-04-05 ENCOUNTER — Ambulatory Visit (HOSPITAL_COMMUNITY)
Admission: RE | Admit: 2017-04-05 | Discharge: 2017-04-05 | Disposition: A | Discharge status: Home / Self Care | Payer: Medicare Other | Source: Ambulatory Visit | Attending: Diagnostic Neuroimaging | Admitting: Diagnostic Neuroimaging

## 2017-04-05 DIAGNOSIS — G7000 Myasthenia gravis without (acute) exacerbation: Secondary | ICD-10-CM | POA: Diagnosis not present

## 2017-04-05 DIAGNOSIS — R062 Wheezing: Secondary | ICD-10-CM | POA: Diagnosis not present

## 2017-04-05 MED ORDER — IOPAMIDOL (ISOVUE-300) INJECTION 61%
75.0000 mL | Freq: Once | INTRAVENOUS | Status: AC | PRN
  Administered 2017-04-05: 75 mL via INTRAVENOUS

## 2017-04-06 ENCOUNTER — Telehealth: Payer: Self-pay | Admitting: *Deleted

## 2017-04-06 ENCOUNTER — Other Ambulatory Visit (HOSPITAL_COMMUNITY): Payer: Medicare Other

## 2017-04-06 NOTE — Telephone Encounter (Signed)
Spoke to pt and relayed that the CT chest results unremarkable. She stated she is on the mestinon, and is taking as prescribed.  Her droopy eyelid is better as well as the double vision instead being constant is now, comes and goes.  Hopefully this will improve.  She verbalized understanding.

## 2017-04-06 NOTE — Telephone Encounter (Signed)
-----   Message from Penni Bombard, MD sent at 04/05/2017  9:01 PM EDT ----- Unremarkable imaging results. Please call patient. Continue current plan. -VRP

## 2017-04-07 ENCOUNTER — Ambulatory Visit (INDEPENDENT_AMBULATORY_CARE_PROVIDER_SITE_OTHER): Payer: Medicare Other | Admitting: Family Medicine

## 2017-04-07 ENCOUNTER — Encounter: Payer: Self-pay | Admitting: Family Medicine

## 2017-04-07 VITALS — BP 140/86 | HR 70 | Temp 98.8°F | Resp 14 | Ht 65.0 in | Wt 269.4 lb

## 2017-04-07 DIAGNOSIS — R609 Edema, unspecified: Secondary | ICD-10-CM | POA: Diagnosis not present

## 2017-04-07 DIAGNOSIS — Z23 Encounter for immunization: Secondary | ICD-10-CM | POA: Diagnosis not present

## 2017-04-07 DIAGNOSIS — E7800 Pure hypercholesterolemia, unspecified: Secondary | ICD-10-CM

## 2017-04-07 DIAGNOSIS — M545 Low back pain, unspecified: Secondary | ICD-10-CM

## 2017-04-07 DIAGNOSIS — I10 Essential (primary) hypertension: Secondary | ICD-10-CM

## 2017-04-07 LAB — BASIC METABOLIC PANEL
BUN/Creatinine Ratio: 11 (calc) (ref 6–22)
BUN: 11 mg/dL (ref 7–25)
CALCIUM: 9.3 mg/dL (ref 8.6–10.4)
CO2: 24 mmol/L (ref 20–32)
Chloride: 104 mmol/L (ref 98–110)
Creat: 1.03 mg/dL — ABNORMAL HIGH (ref 0.50–0.99)
GLUCOSE: 85 mg/dL (ref 65–99)
Potassium: 4.6 mmol/L (ref 3.5–5.3)
Sodium: 140 mmol/L (ref 135–146)

## 2017-04-07 LAB — LIPID PANEL
CHOLESTEROL: 170 mg/dL (ref <200)
HDL: 43 mg/dL — ABNORMAL LOW (ref >50)
LDL Cholesterol (Calc): 104 mg/dL (calc) — ABNORMAL HIGH
NON-HDL CHOLESTEROL (CALC): 127 mg/dL (ref <130)
Total CHOL/HDL Ratio: 4 (calc) (ref <5.0)
Triglycerides: 131 mg/dL (ref <150)

## 2017-04-07 MED ORDER — HYDROCODONE-ACETAMINOPHEN 7.5-325 MG PO TABS: 1.0000 | ORAL_TABLET | Freq: Two times a day (BID) | ORAL | 0 refills | Status: DC | PRN

## 2017-04-07 NOTE — Assessment & Plan Note (Signed)
Check fasting lipid

## 2017-04-07 NOTE — Progress Notes (Signed)
   Subjective:    Patient ID: [name], female    DOB: [birth year], 66 y.o.   MRN: [id number]  Patient presents for 4 month f/u  Patient to follow-up chronic medical problems. Medications reviewed He is on chronic hydrocodone secondary to her osteoarthritisHowever she has had some increased right lower side pain over the past month. No radiation to her leg no change in bowel or bladder her pain medicine does help. She typically is up a Pain medicine at least twice a day she is only getting 45 tablets  She is followed by pulmonary secondary to her obstructive sleep apnea and her asthma, using CPAP , has appt next tuesday  Hypertension she is taking her losartan as prescribed without any difficulty she also has Lasix for blood pressure and edema.  Hyperlipidemia she is taking Lipitor  Diagnosed with ocular myasthenia gravis, now seeing Neurology - Dr. Leta [facility], was seen by [facility] associates as well Now on Mestinon which she feels has been helping.    Due for flu shot  Review Of Systems:  GEN- denies fatigue, fever, weight loss,weakness, recent illness HEENT- denies eye drainage, change in vision, nasal discharge, CVS- denies chest pain, palpitations RESP- denies SOB, cough, wheeze ABD- denies N/V, change in stools, abd pain GU- denies dysuria, hematuria, dribbling, incontinence MSK+joint pain, muscle aches, injury Neuro- denies headache, dizziness, syncope, seizure activity       Objective:    BP 140/86   Pulse 70   Temp 98.8 F (37.1 C) (Oral)   Resp 14   Ht 5\' 5"  (1.651 m)   Wt 269 lb 6.4 oz (122.2 kg)   SpO2 98%   BMI 44.83 kg/m  GEN- NAD, alert and oriented x3 HEENT- PERRL, EOMI, non injected sclera, pink conjunctiva, MMM, oropharynx clear CVS- RRR, no murmur RESP-CTAB ABD-NABS,soft,NT,ND MSK- Spine NT, TTP right paraspinals, neg SLR, fair ROM spine, fair ROM bilat hips  EXT- No edema Pulses- Radial, 2+        Assessment & Plan:       Problem List Items Addressed This Visit      Unprioritized   Back pain   Relevant Medications   HYDROcodone-acetaminophen (NORCO) 7.5-325 MG tablet   Other Relevant Orders   DG Lumbar Spine Complete   Hyperlipidemia    Check fasting lipid      Relevant Orders   Lipid panel   Essential hypertension    No BP meds taken as she is fasting, a little elevated today      Relevant Orders   Basic metabolic panel   Edema - Primary    Controlled with lasix         Note: This dictation was prepared with Dragon dictation along with smaller phrase technology. Any transcriptional errors that result from this process are unintentional.

## 2017-04-07 NOTE — Assessment & Plan Note (Signed)
No BP meds taken as she is fasting, a little elevated today

## 2017-04-07 NOTE — Patient Instructions (Addendum)
Release of records- [facility] [phone] We will call with lab results Take pain medicine twice a day  Get the xrays done on your Back at [facility]  Flu shot done F/U 4 months

## 2017-04-07 NOTE — Assessment & Plan Note (Signed)
Controlled with lasix

## 2017-04-10 ENCOUNTER — Ambulatory Visit: Payer: Medicare Other | Admitting: Adult Health

## 2017-04-10 ENCOUNTER — Ambulatory Visit: Payer: Medicare Other | Admitting: Family Medicine

## 2017-04-10 DIAGNOSIS — Z961 Presence of intraocular lens: Secondary | ICD-10-CM | POA: Diagnosis not present

## 2017-04-10 DIAGNOSIS — H02412 Mechanical ptosis of left eyelid: Secondary | ICD-10-CM | POA: Diagnosis not present

## 2017-04-10 DIAGNOSIS — H25811 Combined forms of age-related cataract, right eye: Secondary | ICD-10-CM | POA: Diagnosis not present

## 2017-04-11 ENCOUNTER — Ambulatory Visit (INDEPENDENT_AMBULATORY_CARE_PROVIDER_SITE_OTHER): Payer: Medicare Other | Admitting: Adult Health

## 2017-04-11 ENCOUNTER — Ambulatory Visit: Payer: Medicare Other | Admitting: Adult Health

## 2017-04-11 ENCOUNTER — Encounter: Payer: Self-pay | Admitting: Adult Health

## 2017-04-11 VITALS — BP 126/76 | HR 64 | Ht 65.0 in | Wt 268.8 lb

## 2017-04-11 DIAGNOSIS — J4520 Mild intermittent asthma, uncomplicated: Secondary | ICD-10-CM

## 2017-04-11 DIAGNOSIS — G4733 Obstructive sleep apnea (adult) (pediatric): Secondary | ICD-10-CM | POA: Diagnosis not present

## 2017-04-11 MED ORDER — ALBUTEROL SULFATE HFA 108 (90 BASE) MCG/ACT IN AERS: 2.0000 | INHALATION_SPRAY | RESPIRATORY_TRACT | 5 refills | Status: DC | PRN

## 2017-04-11 NOTE — Patient Instructions (Addendum)
Set up Home sleep study .  Continue on CPAP At bedtime   Order for new nasal pillows.   Wear each night for at least 4-6 hr  Work on weight loss.  Continue on ProAir As needed  Wheezing .  Follow up with Dr. Ashok Cordia in 3-4 months and As needed

## 2017-04-11 NOTE — Addendum Note (Signed)
Addended by: Parke Poisson E on: 04/11/2017 12:57 PM   Modules accepted: Orders

## 2017-04-11 NOTE — Addendum Note (Signed)
Addended by: Parke Poisson E on: 04/11/2017 10:53 AM   Modules accepted: Orders

## 2017-04-11 NOTE — Assessment & Plan Note (Signed)
Needs new CPAP machine  Per DME/Insurance requirement will not cover so needs new sleep study  Set up for HST  Plan  Set home sleep study .  Cont on CPAP

## 2017-04-11 NOTE — Assessment & Plan Note (Signed)
Well controlled on current regimen  Plan  Patient Instructions  Set up Home sleep study .  Continue on CPAP At bedtime   Order for new nasal pillows.   Wear each night for at least 4-6 hr  Work on weight loss.  Continue on ProAir As needed  Wheezing .  Follow up with Dr. Ashok Cordia in 3-4 months and As needed

## 2017-04-11 NOTE — Progress Notes (Signed)
@Patient  ID: [name], female    DOB: 10-30-1950, 66 y.o.   MRN: [id number]  Chief Complaint  Patient presents with  . Follow-up    OSA /Asthma     Referring provider: Alycia Rossetti, MD  HPI: 66 yo female former smoker followed for mild intermittent asthma , mild restrictive lung disease, and OSA/OHS on CPAP At bedtime    TEST   PFT 02/22/16: FVC 1.52 L (58%) FEV1 1.31 L (65%) FEV1/FVC 0.86 FEF 25-75 1.67 L (88%) negative bronchodilator response                                                                     DLCO corrected 79% (hemoglobin 10.0) 11/19/15: FVC 1.68 L (64%) FEV1 1.44 L (71%) FEV1/FVC 0.86 FEF 25-75 1.78 L (93%) negative bronchodilator response 08/10/15: FVC 1.78 L (68%) FEV1 1.55 L (76%) FEV1/FVC 0.87 FEF 25-75 2.05 L (107%) negative bronchodilator response TLC 3.76 L (72%) RV 81% ERV 17% DLCO uncorrected 37% 04/10/13: FVC 1.61 L (59%) FEV1 1.14 L (66%) FEV1/FVC 0.88 FEF 25-75 2.08 L (102%) positive bronchodilator response TLC 4.10 L (77%) RV 97% ERV 43% DLCO uncorrected 57%   6MWT 08/11/15:  Walked 247.5 meters / Baseline Sat 100% on RA / Nadir Sat 97% on RA  IMAGING CXR PA/LAT 02/24/17 (personally reviewed by me):  Chronic elevation of left hemidiaphragm compared with 2009. No new focal opacity or mass appreciated. No pleural effusion. Heart normal in size & mediastinum normal in contour.  LABS 02/24/15 CBC: 8.0/12.5/36.9/278 BMP: 140/4.5/106/27/11/0.88/93/8.9 LFT: 3.6/6.7/0.7/88/12/26  05/12/14 ABG:  7.40 / 44 / 66 / 92%  04/11/2017 Follow up: Asthma and OSA/OHS  Patient returns for a one-month follow-up. Patient was seen last visit is set up for a new C Pap machine. She has been on C Pap since 2006. Patient says her machine is not been working correctly. However, her home care company said that she has to have a new sleep study in order to get a new machine. Patient also reports that her nasal pillows have a tear in them and niece a new mask.  Patient says she uses her machine every single night and cannot make it without it..  Patient says her asthma is doing well. She denies any flare cough or wheezing. She has albuterol that she uses as needed.. She denies any increased use of albuterol.    No Known Allergies  Immunization History  Administered Date(s) Administered  . Influenza Whole 05/08/2008  . Influenza,inj,Quad PF,6+ Mos 04/30/2013, 04/02/2014, 05/14/2015, 08/09/2016, 04/07/2017  . Pneumococcal Conjugate-13 11/12/2014  . Pneumococcal Polysaccharide-23 04/24/2005, 02/22/2016  . Td 04/24/2005  . Zoster 07/20/2015    Past Medical History:  Diagnosis Date  . Achilles tendinitis   . Anxiety   . Asthma   . Depression   . GERD (gastroesophageal reflux disease)   . Gout   . HTN (hypertension)   . Hyperlipidemia   . Low back pain   . Obesity hypoventilation syndrome ([place])   . Obstructive sleep apnea   . Osteoarthritis     Tobacco History: History  Smoking Status  . Former Smoker  . Packs/day: 2.00  . Years: 36.00  . Types: Cigarettes  . Start date: 06/25/1969  . Quit  date: 03/25/2005  Smokeless Tobacco  . Never Used   Counseling given: Not Answered   Outpatient Encounter Prescriptions as of 04/11/2017  Medication Sig  . acetaminophen (TYLENOL) 325 MG tablet Take 650 mg by mouth every 6 (six) hours as needed for mild pain.  Marland Kitchen albuterol (PROVENTIL HFA;VENTOLIN HFA) 108 (90 Base) MCG/ACT inhaler Inhale 2 puffs into the lungs every 4 (four) hours as needed for wheezing or shortness of breath.  . allopurinol (ZYLOPRIM) 100 MG tablet TAKE 1 TABLET BY MOUTH DAILY  . aspirin EC 81 MG tablet Take 81 mg by mouth daily.  Marland Kitchen atorvastatin (LIPITOR) 10 MG tablet Take 1 tablet (10 mg total) by mouth daily.  . clotrimazole-betamethasone (LOTRISONE) cream Apply 1 application topically 2 (two) times daily as needed. To be applied to the feet as needed  . diphenhydrAMINE (BENADRYL) 25 MG tablet Take 25 mg by mouth daily.    . fluticasone (CUTIVATE) 0.05 % cream APPLY EXTERNALLY TO THE AFFECTED AREA DAILY  . fluticasone (FLONASE) 50 MCG/ACT nasal spray Place 1 spray into both nostrils 2 (two) times daily. (Patient taking differently: Place 1 spray into both nostrils 2 (two) times daily as needed for allergies. )  . furosemide (LASIX) 20 MG tablet TAKE 1 TABLET(20 MG) BY MOUTH DAILY FOR SWELLING  . HYDROcodone-acetaminophen (NORCO) 7.5-325 MG tablet Take 1 tablet by mouth 2 (two) times daily as needed for moderate pain.  Marland Kitchen losartan (COZAAR) 50 MG tablet Take 1 tablet (50 mg total) by mouth daily.  . megestrol (MEGACE) 40 MG tablet TAKE 1 TABLET(40 MG) BY MOUTH DAILY  . NON FORMULARY at bedtime. CPAP and 1 lpm oxygen qhs  . pyridostigmine (MESTINON) 60 MG tablet Take 0.5-1 tablets (30-60 mg total) by mouth 3 (three) times daily.  . ranitidine (ZANTAC) 150 MG tablet Take 1 tablet (150 mg total) by mouth at bedtime.  . traZODone (DESYREL) 50 MG tablet TAKE 1/2 TO 1 TABLET(25 TO 50 MG) BY MOUTH AT BEDTIME AS NEEDED FOR SLEEP   No facility-administered encounter medications on file as of 04/11/2017.      Review of Systems  Constitutional:   No  weight loss, night sweats,  Fevers, chills, fatigue, or  lassitude.  HEENT:   No headaches,  Difficulty swallowing,  Tooth/dental problems, or  Sore throat,                No sneezing, itching, ear ache,  +nasal congestion, post nasal drip,   CV:  No chest pain,  Orthopnea, PND, swelling in lower extremities, anasarca, dizziness, palpitations, syncope.   GI  No heartburn, indigestion, abdominal pain, nausea, vomiting, diarrhea, change in bowel habits, loss of appetite, bloody stools.   Resp:    No excess mucus, no productive cough,  No non-productive cough,  No coughing up of blood.  No change in color of mucus.  No wheezing.  No chest wall deformity  Skin: no rash or lesions.  GU: no dysuria, change in color of urine, no urgency or frequency.  No flank pain, no  hematuria   MS:  No joint pain or swelling.  No decreased range of motion.  No back pain.    Physical Exam  BP 126/76 (BP Location: Left Arm, Cuff Size: Large)   Pulse 64   Ht 5\' 5"  (1.651 m)   Wt 268 lb 12.8 oz (121.9 kg)   SpO2 97%   BMI 44.73 kg/m   GEN: A/Ox3; pleasant , NAD, obese    HEENT:  Orient/AT,  EACs-clear, TMs-wnl, NOSE-clear, THROAT-clear, no lesions, no postnasal drip or exudate noted. Poor dentition, class 2-3 MP airway   NECK:  Supple w/ fair ROM; no JVD; normal carotid impulses w/o bruits; no thyromegaly or nodules palpated; no lymphadenopathy.    RESP  Clear  P & A; w/o, wheezes/ rales/ or rhonchi. no accessory muscle use, no dullness to percussion  CARD:  RRR, no m/r/g, no peripheral edema, pulses intact, no cyanosis or clubbing.  GI:   Soft & nt; nml bowel sounds; no organomegaly or masses detected.   Musco: Warm bil, no deformities or joint swelling noted.   Neuro: alert, no focal deficits noted.    Skin: Warm, no lesions or rashes    Lab Results:  CBC  BMET  BNP  Imaging: Ct Chest W Contrast  Result Date: 04/05/2017 CLINICAL DATA:  Myasthenia gravis with wheezing and fatigue, initial encounter EXAM: CT CHEST WITH CONTRAST TECHNIQUE: Multidetector CT imaging of the chest was performed during intravenous contrast administration. CONTRAST:  21mL ISOVUE-300 IOPAMIDOL (ISOVUE-300) INJECTION 61% COMPARISON:  None. FINDINGS: Cardiovascular: Mild atherosclerotic changes are noted at the origin of the left subclavian artery. The thoracic aorta is otherwise within normal limits. No cardiac enlargement is seen. No significant coronary calcifications are noted. No large central pulmonary embolism is seen. Mediastinum/Nodes: The thoracic inlet is within normal limits. The anterior mediastinum contains fat. No findings to suggest residual thymus tissue or focal neoplasm are identified. No significant hilar or mediastinal adenopathy is noted. The esophagus is  within normal limits. Lungs/Pleura: Slightly limited by patient respiratory motion artifact. No focal infiltrate or sizable effusion is seen. No definitive parenchymal nodule is noted. Upper Abdomen: Visualized upper abdomen is within normal limits. Musculoskeletal: Degenerative changes of the thoracic spine are noted. IMPRESSION: No evidence of residual thymic tissue or or thymoma. No acute abnormality noted. Electronically Signed   By: Inez Catalina M.D.   On: 04/05/2017 09:38     Assessment & Plan:   OBSTRUCTIVE SLEEP APNEA Needs new CPAP machine  Per DME/Insurance requirement will not cover so needs new sleep study  Set up for HST  Plan  Set home sleep study .  Cont on CPAP   Asthma Well controlled on current regimen  Plan  Patient Instructions  Set up Home sleep study .  Continue on CPAP At bedtime   Order for new nasal pillows.   Wear each night for at least 4-6 hr  Work on weight loss.  Continue on ProAir As needed  Wheezing .  Follow up with Dr. Ashok Cordia in 3-4 months and As needed          Rexene Edison, NP 04/11/2017

## 2017-04-12 ENCOUNTER — Ambulatory Visit (HOSPITAL_COMMUNITY)
Admission: RE | Admit: 2017-04-12 | Discharge: 2017-04-12 | Disposition: A | Discharge status: Home / Self Care | Payer: Medicare Other | Source: Ambulatory Visit | Attending: Family Medicine | Admitting: Family Medicine

## 2017-04-12 DIAGNOSIS — M545 Low back pain, unspecified: Secondary | ICD-10-CM

## 2017-04-12 DIAGNOSIS — M47818 Spondylosis without myelopathy or radiculopathy, sacral and sacrococcygeal region: Secondary | ICD-10-CM | POA: Insufficient documentation

## 2017-04-12 DIAGNOSIS — M47816 Spondylosis without myelopathy or radiculopathy, lumbar region: Secondary | ICD-10-CM | POA: Insufficient documentation

## 2017-04-12 DIAGNOSIS — I700 Atherosclerosis of aorta: Secondary | ICD-10-CM | POA: Diagnosis not present

## 2017-04-18 DIAGNOSIS — G4733 Obstructive sleep apnea (adult) (pediatric): Secondary | ICD-10-CM | POA: Diagnosis not present

## 2017-04-22 DIAGNOSIS — J449 Chronic obstructive pulmonary disease, unspecified: Secondary | ICD-10-CM | POA: Diagnosis not present

## 2017-04-27 ENCOUNTER — Ambulatory Visit: Payer: Medicare Other | Attending: Adult Health | Admitting: Pulmonary Disease

## 2017-04-27 DIAGNOSIS — G4733 Obstructive sleep apnea (adult) (pediatric): Secondary | ICD-10-CM | POA: Insufficient documentation

## 2017-05-05 DIAGNOSIS — G4733 Obstructive sleep apnea (adult) (pediatric): Secondary | ICD-10-CM | POA: Diagnosis not present

## 2017-05-05 NOTE — Procedures (Signed)
Patient Name: [name], [name] Date: 04/27/2017 Gender: Female D.O.B: 1951/07/11 Age (years): 60 Referring Provider: Lynelle Smoke Parrett Height (inches): 65 Interpreting Physician: Kara Mead MD, ABSM Weight (lbs): 268 RPSGT: Peak, Robert BMI: 45 MRN: [id number] Neck Size: 18.50   CLINICAL INFORMATION Sleep Study Type: Split Night CPAP  Indication for sleep study: OSA  Epworth Sleepiness Score: 14  SLEEP STUDY TECHNIQUE As per the AASM Manual for the Scoring of Sleep and Associated Events v2.3 (April 2016) with a hypopnea requiring 4% desaturations.  The channels recorded and monitored were frontal, central and occipital EEG, electrooculogram (EOG), submentalis EMG (chin), nasal and oral airflow, thoracic and abdominal wall motion, anterior tibialis EMG, snore microphone, electrocardiogram, and pulse oximetry. Continuous positive airway pressure (CPAP) was initiated when the patient met split night criteria and was titrated according to treat sleep-disordered breathing.  RESPIRATORY PARAMETERS Diagnostic  Total AHI (/hr): 19.4 RDI (/hr): 19.4 OA Index (/hr): - CA Index (/hr): 0.0 REM AHI (/hr): N/A NREM AHI (/hr): 19.4 Supine AHI (/hr): 30.4 Non-supine AHI (/hr): 16.38 Min O2 Sat (%): 82.00 Mean O2 (%): 91.90 Time below 88% (min): 2.6   Titration  Optimal Pressure (cm): 8 AHI at Optimal Pressure (/hr): 10.3 Min O2 at Optimal Pressure (%): 85.0 Supine % at Optimal (%): 100 Sleep % at Optimal (%): 100     SLEEP ARCHITECTURE The recording time for the entire night was 418.8 minutes.  During a baseline period of 259.6 minutes, the patient slept for 173.0 minutes in REM and nonREM, yielding a sleep efficiency of 66.6%. Sleep onset after lights out was 24.0 minutes with a REM latency of N/A minutes. The patient spent 20.81% of the night in stage N1 sleep, 79.19% in stage N2 sleep, 0.00% in stage N3 and 0.00% in REM.  During the titration period of 155.6 minutes, the patient  slept for 94.8 minutes in REM and nonREM, yielding a sleep efficiency of 60.9%. Sleep onset after CPAP initiation was 49.3 minutes with a REM latency of 62.5 minutes. The patient spent 11.08% of the night in stage N1 sleep, 60.44% in stage N2 sleep, 0.00% in stage N3 and 28.48% in REM.  CARDIAC DATA The 2 lead EKG demonstrated sinus rhythm. The mean heart rate was 71.12 beats per minute. Other EKG findings include: None.   LEG MOVEMENT DATA The total Periodic Limb Movements of Sleep (PLMS) were 0. The PLMS index was 0.00 .  IMPRESSIONS - Moderate obstructive sleep apnea occurred during the diagnostic portion of the study(AHI = 19.4/hour). An optimal PAP pressure was selected for this patient ( 8 cm of water) - No significant central sleep apnea occurred during the diagnostic portion of the study (CAI = 0.0/hour). - Moderate oxygen desaturation was noted during the diagnostic portion of the study (Min O2 =82.00%). - The patient snored with loud snoring volume during the diagnostic portion of the study. - No cardiac abnormalities were noted during this study. - Clinically significant periodic limb movements did not occur during sleep.   DIAGNOSIS - Obstructive Sleep Apnea (327.23 [G47.33 ICD-10])   RECOMMENDATIONS - Trial of CPAP therapy on 8 cm H2O with a Medium size Resmed Nasal Pillow Mask AirFit P10 for Her mask and heated humidification. - Avoid alcohol, sedatives and other CNS depressants that may worsen sleep apnea and disrupt normal sleep architecture. - Sleep hygiene should be reviewed to assess factors that may improve sleep quality. - Weight management and regular exercise should be initiated or continued. - Return to [facility]  for re-evaluation after 4 weeks of therapy   Kara Mead MD Board Certified in [place]

## 2017-05-09 ENCOUNTER — Telehealth: Payer: Self-pay | Admitting: Family Medicine

## 2017-05-09 MED ORDER — HYDROCODONE-ACETAMINOPHEN 7.5-325 MG PO TABS: 1.0000 | ORAL_TABLET | Freq: Two times a day (BID) | ORAL | 0 refills | Status: DC | PRN

## 2017-05-09 NOTE — Telephone Encounter (Signed)
Okay to refill? 

## 2017-05-09 NOTE — Progress Notes (Signed)
Discussed with TP New CPAP was provided off the original order by [facility]N on 8.28.18, with the auto settings in that order Per TP, no need to change to the [place] at this time.  Can re-evaluate at next ov

## 2017-05-09 NOTE — Telephone Encounter (Signed)
PATIENT IS CALLING TO GET RX FOR HYDROCODONE  [phone]

## 2017-05-09 NOTE — Telephone Encounter (Signed)
Ok to refill??  Last office visit/ refill 04/07/2017.

## 2017-05-09 NOTE — Telephone Encounter (Signed)
Prescription printed and patient made aware to come to office to pick up after 4pm on 05/09/2017.

## 2017-05-10 ENCOUNTER — Encounter (HOSPITAL_COMMUNITY): Payer: Self-pay

## 2017-05-10 ENCOUNTER — Encounter (HOSPITAL_COMMUNITY)
Admission: RE | Admit: 2017-05-10 | Discharge: 2017-05-10 | Disposition: A | Discharge status: Home / Self Care | Payer: Medicare Other | Source: Ambulatory Visit | Attending: Ophthalmology | Admitting: Ophthalmology

## 2017-05-10 HISTORY — DX: Personal history of other diseases of the nervous system and sense organs: Z86.69

## 2017-05-10 NOTE — Patient Instructions (Addendum)
Your procedure is scheduled on: 05/15/2017  Report to [facility] at  1000  AM.  Call this number if you have problems the morning of surgery: [phone]   Do not eat food or drink liquids :After Midnight.      Take these medicines the morning of surgery with A SIP OF WATER: allopurinol, hydrocodone, cozaar, mestinon, zantac.   Do not wear jewelry, make-up or nail polish.  Do not wear lotions, powders, or perfumes. You may wear deodorant.  Do not shave 48 hours prior to surgery.  Do not bring valuables to the hospital.  Contacts, dentures or bridgework may not be worn into surgery.  Leave suitcase in the car. After surgery it may be brought to your room.  For patients admitted to the hospital, checkout time is 11:00 AM the day of discharge.   Patients discharged the day of surgery will not be allowed to drive home.  :     Please read over the following fact sheets that you were given: Coughing and Deep Breathing, Surgical Site Infection Prevention, Anesthesia Post-op Instructions and Care and Recovery After Surgery    Cataract A cataract is a clouding of the lens of the eye. When a lens becomes cloudy, vision is reduced based on the degree and nature of the clouding. Many cataracts reduce vision to some degree. Some cataracts make people more near-sighted as they develop. Other cataracts increase glare. Cataracts that are ignored and become worse can sometimes look white. The white color can be seen through the pupil. CAUSES   Aging. However, cataracts may occur at any age, even in newborns.   Certain drugs.   Trauma to the eye.   Certain diseases such as diabetes.   Specific eye diseases such as chronic inflammation inside the eye or a sudden attack of a rare form of glaucoma.   Inherited or acquired medical problems.  SYMPTOMS   Gradual, progressive drop in vision in the affected eye.   Severe, rapid visual loss. This most often happens when trauma is the cause.  DIAGNOSIS    To detect a cataract, an eye doctor examines the lens. Cataracts are best diagnosed with an exam of the eyes with the pupils enlarged (dilated) by drops.  TREATMENT  For an early cataract, vision may improve by using different eyeglasses or stronger lighting. If that does not help your vision, surgery is the only effective treatment. A cataract needs to be surgically removed when vision loss interferes with your everyday activities, such as driving, reading, or watching TV. A cataract may also have to be removed if it prevents examination or treatment of another eye problem. Surgery removes the cloudy lens and usually replaces it with a substitute lens (intraocular lens, IOL).  At a time when both you and your doctor agree, the cataract will be surgically removed. If you have cataracts in both eyes, only one is usually removed at a time. This allows the operated eye to heal and be out of danger from any possible problems after surgery (such as infection or poor wound healing). In rare cases, a cataract may be doing damage to your eye. In these cases, your caregiver may advise surgical removal right away. The vast majority of people who have cataract surgery have better vision afterward. HOME CARE INSTRUCTIONS  If you are not planning surgery, you may be asked to do the following:  Use different eyeglasses.   Use stronger or brighter lighting.   Ask your eye doctor about  reducing your medicine dose or changing medicines if it is thought that a medicine caused your cataract. Changing medicines does not make the cataract go away on its own.   Become familiar with your surroundings. Poor vision can lead to injury. Avoid bumping into things on the affected side. You are at a higher risk for tripping or falling.   Exercise extreme care when driving or operating machinery.   Wear sunglasses if you are sensitive to bright light or experiencing problems with glare.  SEEK IMMEDIATE MEDICAL CARE IF:   You  have a worsening or sudden vision loss.   You notice redness, swelling, or increasing pain in the eye.   You have a fever.  Document Released: 07/11/2005 Document Revised: 06/30/2011 Document Reviewed: 03/04/2011 [facility] Patient Information 2012 [place].PATIENT INSTRUCTIONS POST-ANESTHESIA  IMMEDIATELY FOLLOWING SURGERY:  Do not drive or operate machinery for the first twenty four hours after surgery.  Do not make any important decisions for twenty four hours after surgery or while taking narcotic pain medications or sedatives.  If you develop intractable nausea and vomiting or a severe headache please notify your doctor immediately.  FOLLOW-UP:  Please make an appointment with your surgeon as instructed. You do not need to follow up with anesthesia unless specifically instructed to do so.  WOUND CARE INSTRUCTIONS (if applicable):  Keep a dry clean dressing on the anesthesia/puncture wound site if there is drainage.  Once the wound has quit draining you may leave it open to air.  Generally you should leave the bandage intact for twenty four hours unless there is drainage.  If the epidural site drains for more than 36-48 hours please call the anesthesia department.  QUESTIONS?:  Please feel free to call your physician or the hospital operator if you have any questions, and they will be happy to assist you.

## 2017-05-15 ENCOUNTER — Ambulatory Visit (HOSPITAL_COMMUNITY): Payer: Medicare Other | Admitting: Anesthesiology

## 2017-05-15 ENCOUNTER — Encounter (HOSPITAL_COMMUNITY)
Admission: RE | Disposition: A | Discharge status: Home / Self Care | Payer: Self-pay | Source: Ambulatory Visit | Attending: Ophthalmology

## 2017-05-15 ENCOUNTER — Ambulatory Visit (HOSPITAL_COMMUNITY)
Admission: RE | Admit: 2017-05-15 | Discharge: 2017-05-15 | Disposition: A | Discharge status: Home / Self Care | Payer: Medicare Other | Source: Ambulatory Visit | Attending: Ophthalmology | Admitting: Ophthalmology

## 2017-05-15 ENCOUNTER — Encounter (HOSPITAL_COMMUNITY): Payer: Self-pay | Admitting: *Deleted

## 2017-05-15 DIAGNOSIS — Z87891 Personal history of nicotine dependence: Secondary | ICD-10-CM | POA: Insufficient documentation

## 2017-05-15 DIAGNOSIS — H269 Unspecified cataract: Secondary | ICD-10-CM | POA: Diagnosis present

## 2017-05-15 DIAGNOSIS — H25811 Combined forms of age-related cataract, right eye: Secondary | ICD-10-CM | POA: Diagnosis not present

## 2017-05-15 DIAGNOSIS — G4730 Sleep apnea, unspecified: Secondary | ICD-10-CM | POA: Insufficient documentation

## 2017-05-15 DIAGNOSIS — J45909 Unspecified asthma, uncomplicated: Secondary | ICD-10-CM | POA: Diagnosis not present

## 2017-05-15 DIAGNOSIS — I10 Essential (primary) hypertension: Secondary | ICD-10-CM | POA: Diagnosis not present

## 2017-05-15 DIAGNOSIS — H02402 Unspecified ptosis of left eyelid: Secondary | ICD-10-CM | POA: Diagnosis not present

## 2017-05-15 DIAGNOSIS — H2511 Age-related nuclear cataract, right eye: Secondary | ICD-10-CM | POA: Diagnosis not present

## 2017-05-15 HISTORY — PX: CATARACT EXTRACTION W/PHACO: SHX586

## 2017-05-15 SURGERY — PHACOEMULSIFICATION, CATARACT, WITH IOL INSERTION
Anesthesia: Monitor Anesthesia Care | Site: Eye | Laterality: Right

## 2017-05-15 MED ORDER — POVIDONE-IODINE 5 % OP SOLN
OPHTHALMIC | Status: DC | PRN
Start: 2017-05-15 — End: 2017-05-15
  Administered 2017-05-15: 1 via OPHTHALMIC

## 2017-05-15 MED ORDER — MIDAZOLAM HCL 2 MG/2ML IJ SOLN
INTRAMUSCULAR | Status: DC | PRN
  Administered 2017-05-15 (×2): 1 mg via INTRAVENOUS

## 2017-05-15 MED ORDER — CYCLOPENTOLATE-PHENYLEPHRINE 0.2-1 % OP SOLN
1.0000 [drp] | OPHTHALMIC | Status: AC
  Administered 2017-05-15 (×3): 1 [drp] via OPHTHALMIC

## 2017-05-15 MED ORDER — MIDAZOLAM HCL 2 MG/2ML IJ SOLN
INTRAMUSCULAR | Status: AC
  Filled 2017-05-15: qty 2

## 2017-05-15 MED ORDER — LIDOCAINE HCL 3.5 % OP GEL
1.0000 "application " | Freq: Once | OPHTHALMIC | Status: AC
  Administered 2017-05-15: 1 via OPHTHALMIC

## 2017-05-15 MED ORDER — NEOMYCIN-POLYMYXIN-DEXAMETH 3.5-10000-0.1 OP SUSP
OPHTHALMIC | Status: DC | PRN
  Administered 2017-05-15: 2 [drp] via OPHTHALMIC

## 2017-05-15 MED ORDER — ONDANSETRON 4 MG PO TBDP
ORAL_TABLET | ORAL | Status: AC
  Filled 2017-05-15: qty 1

## 2017-05-15 MED ORDER — IPRATROPIUM-ALBUTEROL 0.5-2.5 (3) MG/3ML IN SOLN
RESPIRATORY_TRACT | Status: AC
  Filled 2017-05-15: qty 3

## 2017-05-15 MED ORDER — PROVISC 10 MG/ML IO SOLN
INTRAOCULAR | Status: DC | PRN
  Administered 2017-05-15: 0.85 mL via INTRAOCULAR

## 2017-05-15 MED ORDER — IPRATROPIUM-ALBUTEROL 0.5-2.5 (3) MG/3ML IN SOLN
3.0000 mL | Freq: Once | RESPIRATORY_TRACT | Status: AC
  Administered 2017-05-15: 3 mL via RESPIRATORY_TRACT

## 2017-05-15 MED ORDER — BSS IO SOLN
INTRAOCULAR | Status: DC | PRN
  Administered 2017-05-15: 15 mL

## 2017-05-15 MED ORDER — LACTATED RINGERS IV SOLN
INTRAVENOUS | Status: DC
  Administered 2017-05-15: 10:00:00 via INTRAVENOUS

## 2017-05-15 MED ORDER — TETRACAINE HCL 0.5 % OP SOLN
1.0000 [drp] | OPHTHALMIC | Status: AC
  Administered 2017-05-15 (×3): 1 [drp] via OPHTHALMIC

## 2017-05-15 MED ORDER — IPRATROPIUM-ALBUTEROL 0.5-2.5 (3) MG/3ML IN SOLN: 3.0000 mL | Freq: Four times a day (QID) | RESPIRATORY_TRACT | Status: DC

## 2017-05-15 MED ORDER — EPINEPHRINE PF 1 MG/ML IJ SOLN
INTRAOCULAR | Status: DC | PRN
  Administered 2017-05-15: .7 mL via OPHTHALMIC

## 2017-05-15 MED ORDER — ONDANSETRON 4 MG PO TBDP
4.0000 mg | ORAL_TABLET | Freq: Once | ORAL | Status: AC
  Administered 2017-05-15: 4 mg via ORAL

## 2017-05-15 MED ORDER — PHENYLEPHRINE HCL 2.5 % OP SOLN
1.0000 [drp] | OPHTHALMIC | Status: AC
  Administered 2017-05-15 (×3): 1 [drp] via OPHTHALMIC

## 2017-05-15 MED ORDER — BSS IO SOLN
INTRAOCULAR | Status: DC | PRN
  Administered 2017-05-15: 500 mL

## 2017-05-15 SURGICAL SUPPLY — 13 items
CLOTH BEACON ORANGE TIMEOUT ST (SAFETY) ×2 IMPLANT
EYE SHIELD UNIVERSAL CLEAR (GAUZE/BANDAGES/DRESSINGS) ×2 IMPLANT
GLOVE BIOGEL PI IND STRL 6.5 (GLOVE) IMPLANT
GLOVE BIOGEL PI IND STRL 7.0 (GLOVE) IMPLANT
GLOVE BIOGEL PI INDICATOR 6.5 (GLOVE) ×2
GLOVE BIOGEL PI INDICATOR 7.0 (GLOVE) ×2
LENS ALC ACRYL/TECN (Ophthalmic Related) ×2 IMPLANT
PAD ARMBOARD 7.5X6 YLW CONV (MISCELLANEOUS) ×2 IMPLANT
SYRINGE LUER LOK 1CC (MISCELLANEOUS) ×2 IMPLANT
TAPE SURG TRANSPORE 1 IN (GAUZE/BANDAGES/DRESSINGS) IMPLANT
TAPE SURGICAL TRANSPORE 1 IN (GAUZE/BANDAGES/DRESSINGS) ×2
VISCOELASTIC ADDITIONAL (OPHTHALMIC RELATED) ×2 IMPLANT
WATER STERILE IRR 250ML POUR (IV SOLUTION) ×2 IMPLANT

## 2017-05-15 NOTE — Anesthesia Preprocedure Evaluation (Signed)
Anesthesia Evaluation  Patient identified by MRN, date of birth, ID band Patient awake  General Assessment Comment:Currently has a cold, sniffles for a week,   Airway Mallampati: I       Dental  (+) Edentulous Upper, Poor Dentition   Pulmonary asthma , sleep apnea, Continuous Positive Airway Pressure Ventilation and Oxygen sleep apnea , former smoker,  Severe pulmonary dysfunction   Pulmonary exam normal        Cardiovascular hypertension, Normal cardiovascular exam Rhythm:Regular Rate:Normal  Systolic function was vigorous. The estimated ejection fraction was in the range of 65% to 70 (2014)   Neuro/Psych    GI/Hepatic   Endo/Other    Renal/GU      Musculoskeletal   Abdominal Normal abdominal exam  (+)   Peds  Hematology   Anesthesia Other Findings   Reproductive/Obstetrics                             Anesthesia Physical Anesthesia Plan  ASA: IV  Anesthesia Plan: MAC   Post-op Pain Management:    Induction:   PONV Risk Score and Plan:   Airway Management Planned: Nasal Cannula  Additional Equipment:   Intra-op Plan:   Post-operative Plan:   Informed Consent: I have reviewed the patients History and Physical, chart, labs and discussed the procedure including the risks, benefits and alternatives for the proposed anesthesia with the patient or authorized representative who has indicated his/her understanding and acceptance.   Dental advisory given  Plan Discussed with: CRNA  Anesthesia Plan Comments:         Anesthesia Quick Evaluation

## 2017-05-15 NOTE — Anesthesia Postprocedure Evaluation (Signed)
Anesthesia Post Note  Patient: [name]  Procedure(s) Performed: CATARACT EXTRACTION PHACO AND INTRAOCULAR LENS PLACEMENT ([place]) (Right Eye)  Patient location during evaluation: Short Stay Anesthesia Type: MAC Level of consciousness: awake and alert Pain management: pain level controlled Vital Signs Assessment: post-procedure vital signs reviewed and stable Respiratory status: spontaneous breathing Cardiovascular status: stable Postop Assessment: no apparent nausea or vomiting Anesthetic complications: no     Last Vitals:  Vitals:   05/15/17 1000 05/15/17 1015  BP: (!) 126/54 (!) 131/59  Resp: (!) 24 19  Temp:    SpO2: 100% 99%    Last Pain:  Vitals:   05/15/17 0931  TempSrc: Oral                 Chandel Zaun

## 2017-05-15 NOTE — H&P (Signed)
I have reviewed the H&P, the patient was re-examined, and I have identified no interval changes in medical condition and plan of care since the history and physical of record  

## 2017-05-15 NOTE — Discharge Instructions (Signed)

## 2017-05-15 NOTE — Op Note (Signed)
Date of Admission: 05/15/2017  Date of Surgery: 05/15/2017  Pre-Op Dx: Cataract Right  Eye  Post-Op Dx: Senile Combined Cataract  Right  Eye,  Dx Code J00.938  Surgeon: Tonny Branch, M.D.  Assistants: None  Anesthesia: Topical with MAC  Indications: Painless, progressive loss of vision with compromise of daily activities.  Surgery: Cataract Extraction with Intraocular lens Implant Right Eye  Discription: The patient had dilating drops and viscous lidocaine placed into the Right eye in the pre-op holding area. After transfer to the operating room, a time out was performed. The patient was then prepped and draped. Beginning with a 61m blade a paracentesis port was made at the surgeon's 2 o'clock position. The anterior chamber was then filled with 1% non-preserved lidocaine. This was followed by filling the anterior chamber with Provisc.  A 2.410mkeratome blade was used to make a clear corneal incision at the temporal limbus.  A bent cystatome needle was used to create a continuous tear capsulotomy. During this step the anterior chamber was shallowing and the cystotome was removed to palace more Provisc/ The chamber shallowed more and the anterior capsulotomy radialized nasally. The chamber was reformed with Provisc. The cystotome needle was used to start another teat and it was completed. Gentle hydrodissection was performed with balanced salt solution on a Fine canula. The lens nucleus was then removed using the phacoemulsification handpiece. Residual cortex was removed with the I&A handpiece. The anterior chamber and capsular bag were refilled with Provisc. A posterior chamber intraocular lens was placed into the capsular bag with it's injector. The implant was positioned with the Kuglan hook with the haptics 90 degrees away from the anterior capsule tear. The Provisc was then removed from the anterior chamber and capsular bag with the I&A handpiece. Stromal hydration of the main incision and  paracentesis port was performed with BSS on a Fine canula. The wounds were tested for leak which was negative. The patient tolerated the procedure well. There were no operative complications. The patient was then transferred to the recovery room in stable condition.  Complications: Anterior capsule tear.  Specimen: None  EBL: None  Prosthetic device: Abbott Technis, PCB00, power 21.0, SN [id number]

## 2017-05-15 NOTE — Anesthesia Procedure Notes (Signed)
Procedure Name: MAC Date/Time: 05/15/2017 10:51 AM Performed by: Vista Deck Pre-anesthesia Checklist: Patient identified, Emergency Drugs available, Suction available, Timeout performed and Patient being monitored Patient Re-evaluated:Patient Re-evaluated prior to induction Oxygen Delivery Method: Nasal Cannula

## 2017-05-15 NOTE — Transfer of Care (Signed)
Immediate Anesthesia Transfer of Care Note  Patient: [name]  Procedure(s) Performed: CATARACT EXTRACTION PHACO AND INTRAOCULAR LENS PLACEMENT ([facility]) (Right Eye)  Patient Location: Short Stay  Anesthesia Type:MAC  Level of Consciousness: awake and alert   Airway & Oxygen Therapy: Patient Spontanous Breathing  Post-op Assessment: Report given to RN and Post -op Vital signs reviewed and stable  Post vital signs: Reviewed and stable  Last Vitals:  Vitals:   05/15/17 1000 05/15/17 1015  BP: (!) 126/54 (!) 131/59  Resp: (!) 24 19  Temp:    SpO2: 100% 99%    Last Pain:  Vitals:   05/15/17 0931  TempSrc: Oral      Patients Stated Pain Goal: 8 (81/44/81 8563)  Complications: No apparent anesthesia complications

## 2017-05-16 ENCOUNTER — Encounter (HOSPITAL_COMMUNITY): Payer: Self-pay | Admitting: Ophthalmology

## 2017-05-18 DIAGNOSIS — G4733 Obstructive sleep apnea (adult) (pediatric): Secondary | ICD-10-CM | POA: Diagnosis not present

## 2017-05-22 DIAGNOSIS — J449 Chronic obstructive pulmonary disease, unspecified: Secondary | ICD-10-CM | POA: Diagnosis not present

## 2017-06-08 ENCOUNTER — Telehealth: Payer: Self-pay | Admitting: Family Medicine

## 2017-06-08 MED ORDER — HYDROCODONE-ACETAMINOPHEN 7.5-325 MG PO TABS: 1.0000 | ORAL_TABLET | Freq: Two times a day (BID) | ORAL | 0 refills | Status: DC | PRN

## 2017-06-08 NOTE — Telephone Encounter (Signed)
Okay to refill? 

## 2017-06-08 NOTE — Telephone Encounter (Signed)
Prescription printed and patient made aware to come to office to pick up on 06/09/2017.

## 2017-06-08 NOTE — Telephone Encounter (Signed)
Ok to refill??  Last office visit 04/07/2017.  Last refill 05/09/2017.

## 2017-06-08 NOTE — Telephone Encounter (Signed)
Requesting refill on hydrocodone.   °

## 2017-06-14 ENCOUNTER — Other Ambulatory Visit: Payer: Self-pay | Admitting: Family Medicine

## 2017-06-18 DIAGNOSIS — G4733 Obstructive sleep apnea (adult) (pediatric): Secondary | ICD-10-CM | POA: Diagnosis not present

## 2017-06-20 ENCOUNTER — Other Ambulatory Visit: Payer: Self-pay | Admitting: Family Medicine

## 2017-06-22 DIAGNOSIS — J449 Chronic obstructive pulmonary disease, unspecified: Secondary | ICD-10-CM | POA: Diagnosis not present

## 2017-07-05 ENCOUNTER — Encounter: Payer: Self-pay | Admitting: Diagnostic Neuroimaging

## 2017-07-05 ENCOUNTER — Ambulatory Visit (INDEPENDENT_AMBULATORY_CARE_PROVIDER_SITE_OTHER): Payer: Medicare Other | Admitting: Diagnostic Neuroimaging

## 2017-07-05 VITALS — BP 170/70 | HR 85 | Wt 275.6 lb

## 2017-07-05 DIAGNOSIS — H02402 Unspecified ptosis of left eyelid: Secondary | ICD-10-CM

## 2017-07-05 DIAGNOSIS — G7000 Myasthenia gravis without (acute) exacerbation: Secondary | ICD-10-CM

## 2017-07-05 MED ORDER — PYRIDOSTIGMINE BROMIDE 60 MG PO TABS: 60.0000 mg | ORAL_TABLET | Freq: Three times a day (TID) | ORAL | 4 refills | Status: DC

## 2017-07-05 NOTE — Progress Notes (Signed)
[facility]  PATIENT: [name] DOB: [birth year]  REFERRING CLINICIAN: Mincey HISTORY FROM: patient  REASON FOR VISIT: follow up   HISTORICAL  CHIEF COMPLAINT:  Chief Complaint  Patient presents with  . Vision disorder    rm 7, Danae Chen- family friend, "cataract surgery Oct 2018; sometimes still get a little blurred vision and cross eyed; has improved"  . Follow-up    4 month    HISTORY OF PRESENT ILLNESS:   UPDATE (07/05/17, VRP): Since last visit, doing well. Tolerating pyridostigmine (60mg  three times a day). No alleviating or aggravating factors. Eyes are better. Has some flare of sxs every month (2x per month; few hrs each time). No breathing, swallowing or speaking problems. CT chest reviewed with patient.   PRIOR HPI (03/22/17): 66 year old female with hypertension, hypercholesterolemia, anxiety, here for evaluation of double vision. 6 weeks ago patient had sudden onset of redness in both eyes. She then developed double vision and left eyelid drooping. She went to eye doctor, then referred to emergency room for MRI testing to rule out stroke. MRI of the brain and orbits were negative for acute process. Patient then had myasthenia gravis antibody panel testing which was notable for borderline positive anti-striation autoantibodies. Patient for here for further evaluation and management. Patient also reports a number of other symptoms including fatigue, swelling in legs, wheezing, headache, numbness, weakness, slurred speech. However these symptoms do not correlate with the recent 6 weeks of double vision and left eyelid weakness. Patient has been using an eye patch due to her double vision. Symptoms have been fairly consistent since onset. However later in her conversation patient states that symptoms fluctuate and are worse in the evening.   REVIEW OF SYSTEMS: Full 14 system review of systems performed and negative with exception of: fatigue blurred vision  double vision eye redness double vision neck pain back pain.   ALLERGIES: No Known Allergies  HOME MEDICATIONS: Outpatient Medications Prior to Visit  Medication Sig Dispense Refill  . acetaminophen (TYLENOL) 325 MG tablet Take 650 mg by mouth every 6 (six) hours as needed for mild pain.    Marland Kitchen albuterol (PROVENTIL HFA;VENTOLIN HFA) 108 (90 Base) MCG/ACT inhaler Inhale 2 puffs into the lungs every 4 (four) hours as needed for wheezing or shortness of breath. 1 Inhaler 5  . allopurinol (ZYLOPRIM) 100 MG tablet TAKE 1 TABLET BY MOUTH DAILY 90 tablet 0  . aspirin EC 81 MG tablet Take 81 mg by mouth daily.    Marland Kitchen atorvastatin (LIPITOR) 10 MG tablet Take 1 tablet (10 mg total) by mouth daily. 90 tablet 2  . clotrimazole-betamethasone (LOTRISONE) cream Apply 1 application topically 2 (two) times daily as needed. To be applied to the feet as needed 45 g 3  . diphenhydrAMINE (BENADRYL) 25 MG tablet Take 25 mg by mouth daily.    . fluticasone (CUTIVATE) 0.05 % cream APPLY EXTERNALLY TO THE AFFECTED AREA DAILY 30 g 3  . fluticasone (FLONASE) 50 MCG/ACT nasal spray Place 1 spray into both nostrils 2 (two) times daily. (Patient taking differently: Place 1 spray into both nostrils 2 (two) times daily as needed for allergies. ) 16 g 3  . furosemide (LASIX) 20 MG tablet TAKE 1 TABLET(20 MG) BY MOUTH DAILY FOR SWELLING 90 tablet 2  . HYDROcodone-acetaminophen (NORCO) 7.5-325 MG tablet Take 1 tablet 2 (two) times daily as needed by mouth for moderate pain. 60 tablet 0  . losartan (COZAAR) 50 MG tablet Take 1 tablet (50 mg total)  by mouth daily. 90 tablet 3  . megestrol (MEGACE) 40 MG tablet TAKE 1 TABLET(40 MG) BY MOUTH DAILY 90 tablet 0  . NON FORMULARY at bedtime. CPAP and 1 lpm oxygen qhs    . pyridostigmine (MESTINON) 60 MG tablet Take 0.5-1 tablets (30-60 mg total) by mouth 3 (three) times daily. 90 tablet 6  . ranitidine (ZANTAC) 150 MG tablet Take 1 tablet (150 mg total) by mouth at bedtime. 90 tablet 3  .  traZODone (DESYREL) 50 MG tablet TAKE 1/2 TO 1 TABLET(25 TO 50 MG) BY MOUTH AT BEDTIME AS NEEDED FOR SLEEP 90 tablet 2   No facility-administered medications prior to visit.     PAST MEDICAL HISTORY: Past Medical History:  Diagnosis Date  . Achilles tendinitis   . Anxiety   . Asthma   . Depression   . GERD (gastroesophageal reflux disease)   . Gout   . H/O myasthenia gravis    left eye  . HTN (hypertension)   . Hyperlipidemia   . Low back pain   . Obesity hypoventilation syndrome ([place])   . Obstructive sleep apnea   . Osteoarthritis     PAST SURGICAL HISTORY: Past Surgical History:  Procedure Laterality Date  . CARDIAC CATHETERIZATION  2006  . CATARACT EXTRACTION W/PHACO Left 09/17/2015   Procedure: CATARACT EXTRACTION PHACO AND INTRAOCULAR LENS PLACEMENT LEFT EYE cde=8.58;  Surgeon: Tonny Branch, MD;  Location: [facility];  Service: Ophthalmology;  Laterality: Left;  . CATARACT EXTRACTION W/PHACO Right 05/15/2017   Procedure: CATARACT EXTRACTION PHACO AND INTRAOCULAR LENS PLACEMENT ([facility]);  Surgeon: Tonny Branch, MD;  Location: [facility];  Service: Ophthalmology;  Laterality: Right;  CDE: 6.34  . COLONOSCOPY N/A 12/25/2013   Procedure: COLONOSCOPY;  Surgeon: Rogene Houston, MD;  Location: [facility];  Service: Endoscopy;  Laterality: N/A;  730-rescheduled Ann notified pt  . CYST EXCISION N/A 09/12/2016   Procedure: EXCISION SEBACEOUS CYST, BACK;  Surgeon: Aviva Signs, MD;  Location: [facility];  Service: General;  Laterality: N/A;  . TUBAL LIGATION      FAMILY HISTORY: Family History  Problem Relation Age of Onset  . Heart disease Mother   . Thyroid disease Mother   . Heart failure Father   . Lung disease Father   . Diabetes Brother   . Crohn's disease Daughter     SOCIAL HISTORY:  Social History   Socioeconomic History  . Marital status: Widowed    Spouse name: Not on file  . Number of children: 3  . Years of education: 28  . Highest education level: Not on file  Social  Needs  . Financial resource strain: Not on file  . Food insecurity - worry: Not on file  . Food insecurity - inability: Not on file  . Transportation needs - medical: Not on file  . Transportation needs - non-medical: Not on file  Occupational History  . Occupation: disabled    Fish farm manager: UNEMPLOYED  Tobacco Use  . Smoking status: Former Smoker    Packs/day: 2.00    Years: 36.00    Pack years: 72.00    Types: Cigarettes    Start date: 06/25/1969    Last attempt to quit: 03/25/2005    Years since quitting: 12.2  . Smokeless tobacco: Never Used  Substance and Sexual Activity  . Alcohol use: No    Alcohol/week: 0.0 oz  . Drug use: No  . Sexual activity: No    Birth control/protection: Post-menopausal  Other Topics Concern  .  Not on file  Social History Narrative   Originally from [place]. She has always lived in [place]. 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 No pets currently. No bird, mold, or hot tub exposure.    Lives alone   No caffeine     PHYSICAL EXAM  GENERAL EXAM/CONSTITUTIONAL: Vitals:  Vitals:   07/05/17 1251  BP: (!) 170/70  Pulse: 85  Weight: 275 lb 9.6 oz (125 kg)   Body mass index is 45.86 kg/m.  Visual Acuity Screening   Right eye Left eye Both eyes  Without correction: 20/50 20/50   With correction:       Patient is in no distress; well developed, nourished and groomed; neck is supple  POOR DENTITION  CARDIOVASCULAR:  Examination of carotid arteries is normal; no carotid bruits  Regular rate and rhythm, no murmurs  Examination of peripheral vascular system by observation and palpation is normal  EYES:  Ophthalmoscopic exam of optic discs and posterior segments is normal; no papilledema or hemorrhages  MUSCULOSKELETAL:  Gait, strength, tone, movements noted in Neurologic exam below  NEUROLOGIC: MENTAL STATUS:  No flowsheet data found.  awake, alert, oriented to person, place and time  recent and remote  memory intact  normal attention and concentration  language fluent, comprehension intact, naming intact,   fund of knowledge appropriate  CRANIAL NERVE:   2nd - no papilledema on fundoscopic exam  2nd, 3rd, 4th, 6th - pupils equal and reactive to light, visual fields full to confrontation, extraocular muscles intact, no nystagmus; LEFT PTOSIS; NO DIPLOPIA  5th - facial sensation symmetric  7th - facial strength symmetric  8th - hearing intact  9th - palate elevates symmetrically, uvula midline  11th - shoulder shrug symmetric  12th - tongue protrusion midline  MOTOR:   normal bulk and tone, full strength in the BUE, BLE  SENSORY:   normal and symmetric to light touch  COORDINATION:   finger-nose-finger, fine finger movements normal  REFLEXES:   deep tendon reflexes present and symmetric  GAIT/STATION:   narrow based gait    DIAGNOSTIC DATA (LABS, IMAGING, TESTING) - I reviewed patient records, labs, notes, testing and imaging myself where available.  Lab Results  Component Value Date   WBC 7.5 02/24/2017   HGB 12.2 02/24/2017   HCT 35.2 (L) 02/24/2017   MCV 89.1 02/24/2017   PLT 284 02/24/2017      Component Value Date/Time   NA 140 04/07/2017 1159   K 4.6 04/07/2017 1159   CL 104 04/07/2017 1159   CO2 24 04/07/2017 1159   GLUCOSE 85 04/07/2017 1159   BUN 11 04/07/2017 1159   CREATININE 1.03 (H) 04/07/2017 1159   CALCIUM 9.3 04/07/2017 1159   PROT 7.4 02/24/2017 0938   ALBUMIN 3.5 02/24/2017 0938   AST 25 02/24/2017 0938   ALT 12 (L) 02/24/2017 0938   ALKPHOS 71 02/24/2017 0938   BILITOT 1.2 02/24/2017 0938   GFRNONAA 46 (L) 02/24/2017 0938   GFRAA 54 (L) 02/24/2017 0938   Lab Results  Component Value Date   CHOL 170 04/07/2017   HDL 43 (L) 04/07/2017   LDLCALC 89 08/09/2016   LDLDIRECT 157 (H) 02/25/2013   TRIG 131 04/07/2017   CHOLHDL 4.0 04/07/2017   Lab Results  Component Value Date   HGBA1C 5.5 08/09/2016   Lab Results    Component Value Date   VITAMINB12 450 01/25/2012   Lab Results  Component Value Date   TSH  1.766 01/25/2012    02/24/17 MRI brain / orbits [I reviewed images myself and agree with interpretation. -VRP]  - No explanation for symptoms. No infarct, compressive lesion, or neuritis findings.  03/01/17 labs - anti AchR binding, blocking, modulation - normal - anti striational ab - 1:40 (h)  04/05/17 CT chest - No evidence of residual thymic tissue or or thymoma. - No acute abnormality noted.    ASSESSMENT AND PLAN  66 y.o. year old female here with new onset of double vision left eyelid ptosis, with positive ice pack test in office, and slightly positive anti-striation all antibody testing. Findings consistent with ocular myasthenia gravis. Doing better on pyridostigmine.    Dx:  1. Ocular myasthenia (HCC)   2. Ptosis of left eyelid      PLAN:  I spent 25 minutes of face to face time with patient. Greater than 50% of time was spent in counseling and coordination of care with patient. In summary we discussed:   - continue pyridostigmine 60mg  three times per day - may consider additional immunosuppressant therapy (cellcept, imuran, prednisone) in future - cautioned patient if she develops breathing, chewing, swallowing sxs; patient to contact us right away or go to ER if bulbar or generalized symptoms develop  Meds ordered this encounter  Medications  . pyridostigmine (MESTINON) 60 MG tablet    Sig: Take 1 tablet (60 mg total) by mouth 3 (three) times daily.    Dispense:  270 tablet    Refill:  4   Return in about 6 months (around 01/03/2018).    Penni Bombard, MD 61/44/3154, 0:08 PM Certified in Neurology, Neurophysiology and Neuroimaging  [facility] [address], [place], [place] [address] [phone]

## 2017-07-07 ENCOUNTER — Telehealth: Payer: Self-pay | Admitting: Family Medicine

## 2017-07-07 MED ORDER — HYDROCODONE-ACETAMINOPHEN 7.5-325 MG PO TABS: 1.0000 | ORAL_TABLET | Freq: Two times a day (BID) | ORAL | 0 refills | Status: DC | PRN

## 2017-07-07 NOTE — Telephone Encounter (Signed)
Ok to refill??  Last office visit 04/07/2017.  Last refill 06/08/2017.

## 2017-07-07 NOTE — Telephone Encounter (Signed)
Prescription printed and patient made aware to come to office to pick up after 4pm on 07/07/2017.

## 2017-07-07 NOTE — Addendum Note (Signed)
Addended by: Sheral Flow on: 07/07/2017 03:40 PM   Modules accepted: Orders

## 2017-07-07 NOTE — Telephone Encounter (Signed)
Okay to refill? 

## 2017-07-07 NOTE — Telephone Encounter (Signed)
Pt needs refill on hydrocodone.  °

## 2017-07-18 DIAGNOSIS — G4733 Obstructive sleep apnea (adult) (pediatric): Secondary | ICD-10-CM | POA: Diagnosis not present

## 2017-07-22 DIAGNOSIS — J449 Chronic obstructive pulmonary disease, unspecified: Secondary | ICD-10-CM | POA: Diagnosis not present

## 2017-08-03 DIAGNOSIS — G4733 Obstructive sleep apnea (adult) (pediatric): Secondary | ICD-10-CM | POA: Diagnosis not present

## 2017-08-07 ENCOUNTER — Ambulatory Visit (INDEPENDENT_AMBULATORY_CARE_PROVIDER_SITE_OTHER): Payer: Medicare Other | Admitting: Family Medicine

## 2017-08-07 ENCOUNTER — Encounter: Payer: Self-pay | Admitting: Family Medicine

## 2017-08-07 ENCOUNTER — Other Ambulatory Visit: Payer: Self-pay

## 2017-08-07 DIAGNOSIS — I10 Essential (primary) hypertension: Secondary | ICD-10-CM | POA: Diagnosis not present

## 2017-08-07 DIAGNOSIS — M8949 Other hypertrophic osteoarthropathy, multiple sites: Secondary | ICD-10-CM

## 2017-08-07 DIAGNOSIS — M150 Primary generalized (osteo)arthritis: Secondary | ICD-10-CM

## 2017-08-07 DIAGNOSIS — M159 Polyosteoarthritis, unspecified: Secondary | ICD-10-CM

## 2017-08-07 DIAGNOSIS — G4733 Obstructive sleep apnea (adult) (pediatric): Secondary | ICD-10-CM | POA: Diagnosis not present

## 2017-08-07 DIAGNOSIS — R7302 Impaired glucose tolerance (oral): Secondary | ICD-10-CM

## 2017-08-07 MED ORDER — HYDROCODONE-ACETAMINOPHEN 7.5-325 MG PO TABS: 1.0000 | ORAL_TABLET | Freq: Two times a day (BID) | ORAL | 0 refills | Status: DC | PRN

## 2017-08-07 MED ORDER — FLUTICASONE PROPIONATE 0.05 % EX CREA: TOPICAL_CREAM | CUTANEOUS | 3 refills | Status: DC

## 2017-08-07 NOTE — Assessment & Plan Note (Signed)
Check A1C 

## 2017-08-07 NOTE — Addendum Note (Signed)
Addended by: Sheral Flow on: 08/07/2017 10:41 AM   Modules accepted: Orders

## 2017-08-07 NOTE — Assessment & Plan Note (Signed)
Weight loss noted,she has cut out some sweets Check labs and continue to monitor She feels well

## 2017-08-07 NOTE — Assessment & Plan Note (Signed)
Continue with CPAP Therapy

## 2017-08-07 NOTE — Progress Notes (Signed)
   Subjective:    Patient ID: [name], female    DOB: June 09, 1951, 67 y.o.   MRN: [id number]  Patient presents for Follow-up (is not fasting)  Since our last viist, had her lens implant done in Oct , she has been diagnosed with Ocular Mysasthenia Gravis in Left eye, taking Pyridisostimine 60mg  three times, she has been tolearing medication wihtout andy difficulty   Weight down 10lbs since Sept, appetite is good, feels good   Request refill of Norco, for her OA of feet/bone spurs,   OSA- using CPAP as prescribed   Review Of Systems:  GEN- denies fatigue, fever, weight loss,weakness, recent illness HEENT- denies eye drainage, change in vision, nasal discharge, CVS- denies chest pain, palpitations RESP- denies SOB, cough, wheeze ABD- denies N/V, change in stools, abd pain GU- denies dysuria, hematuria, dribbling, incontinence MSK-+ joint pain, muscle aches, injury Neuro- denies headache, dizziness, syncope, seizure activity       Objective:    BP 138/72   Pulse 76   Temp 98.1 F (36.7 C) (Oral)   Resp 16   Ht 5\' 5"  (1.651 m)   Wt 259 lb (117.5 kg)   SpO2 100%   BMI 43.10 kg/m  GEN- NAD, alert and oriented x3 HEENT- PERRL, EOMI, non injected sclera, pink conjunctiva, MMM, oropharynx clear, mild droop left upper lid Neck- Supple, no LAD CVS- RRR, no murmur RESP-CTAB EXT- No edema Pulses- Radial, DP- 2+        Assessment & Plan:      Problem List Items Addressed This Visit      Unprioritized   Osteoarthritis    Pain medication refilled      OBSTRUCTIVE SLEEP APNEA    Continue with CPAP Therapy      Morbid obesity ([place]) - Primary    Weight loss noted,she has cut out some sweets Check labs and continue to monitor She feels well      Glucose intolerance (impaired glucose tolerance)    Check A1C      Relevant Orders   Hemoglobin A1c   Essential hypertension    Well controlled       Relevant Orders   Basic metabolic panel      Note:  This dictation was prepared with Dragon dictation along with smaller phrase technology. Any transcriptional errors that result from this process are unintentional.

## 2017-08-07 NOTE — Assessment & Plan Note (Signed)
Pain medication refilled

## 2017-08-07 NOTE — Patient Instructions (Addendum)
F/U 4 months PHYSICAL

## 2017-08-07 NOTE — Assessment & Plan Note (Signed)
Well controlled 

## 2017-08-08 LAB — HEMOGLOBIN A1C
HEMOGLOBIN A1C: 5.6 %{Hb} (ref <5.7)
MEAN PLASMA GLUCOSE: 114 (calc)
eAG (mmol/L): 6.3 (calc)

## 2017-08-08 LAB — BASIC METABOLIC PANEL
BUN / CREAT RATIO: 15 (calc) (ref 6–22)
BUN: 16 mg/dL (ref 7–25)
CALCIUM: 9.3 mg/dL (ref 8.6–10.4)
CO2: 30 mmol/L (ref 20–32)
Chloride: 105 mmol/L (ref 98–110)
Creat: 1.09 mg/dL — ABNORMAL HIGH (ref 0.50–0.99)
Glucose, Bld: 147 mg/dL — ABNORMAL HIGH (ref 65–99)
POTASSIUM: 4.8 mmol/L (ref 3.5–5.3)
SODIUM: 140 mmol/L (ref 135–146)

## 2017-08-10 ENCOUNTER — Encounter: Payer: Self-pay | Admitting: *Deleted

## 2017-08-18 DIAGNOSIS — G4733 Obstructive sleep apnea (adult) (pediatric): Secondary | ICD-10-CM | POA: Diagnosis not present

## 2017-08-22 DIAGNOSIS — J449 Chronic obstructive pulmonary disease, unspecified: Secondary | ICD-10-CM | POA: Diagnosis not present

## 2017-09-05 ENCOUNTER — Other Ambulatory Visit: Payer: Self-pay | Admitting: Family Medicine

## 2017-09-05 DIAGNOSIS — Z1231 Encounter for screening mammogram for malignant neoplasm of breast: Secondary | ICD-10-CM

## 2017-09-07 ENCOUNTER — Other Ambulatory Visit: Payer: Self-pay | Admitting: Family Medicine

## 2017-09-07 NOTE — Telephone Encounter (Signed)
Pt needs refill on hydrocodone to [facility] [address]

## 2017-09-07 NOTE — Telephone Encounter (Signed)
Ok to refill??  Last office visit/ refill 08/07/2017.

## 2017-09-08 MED ORDER — HYDROCODONE-ACETAMINOPHEN 7.5-325 MG PO TABS: 1.0000 | ORAL_TABLET | Freq: Two times a day (BID) | ORAL | 0 refills | Status: DC | PRN

## 2017-09-11 ENCOUNTER — Other Ambulatory Visit: Payer: Self-pay | Admitting: Family Medicine

## 2017-09-18 ENCOUNTER — Other Ambulatory Visit: Payer: Self-pay | Admitting: Family Medicine

## 2017-09-18 DIAGNOSIS — G4733 Obstructive sleep apnea (adult) (pediatric): Secondary | ICD-10-CM | POA: Diagnosis not present

## 2017-09-21 DIAGNOSIS — J449 Chronic obstructive pulmonary disease, unspecified: Secondary | ICD-10-CM | POA: Diagnosis not present

## 2017-10-04 ENCOUNTER — Ambulatory Visit (HOSPITAL_COMMUNITY)
Admission: RE | Admit: 2017-10-04 | Discharge: 2017-10-04 | Disposition: A | Discharge status: Home / Self Care | Payer: Medicare Other | Source: Ambulatory Visit | Attending: Family Medicine | Admitting: Family Medicine

## 2017-10-04 DIAGNOSIS — Z1231 Encounter for screening mammogram for malignant neoplasm of breast: Secondary | ICD-10-CM | POA: Diagnosis not present

## 2017-10-05 ENCOUNTER — Other Ambulatory Visit: Payer: Self-pay | Admitting: Family Medicine

## 2017-10-05 NOTE — Telephone Encounter (Signed)
Patient is calling to get refill on her hydrocodone  Please send to [facility] [place]

## 2017-10-05 NOTE — Telephone Encounter (Signed)
Ok to refill??  Last office visit 08/07/2017.  Last refill 09/08/2017.

## 2017-10-06 MED ORDER — HYDROCODONE-ACETAMINOPHEN 7.5-325 MG PO TABS: 1.0000 | ORAL_TABLET | Freq: Two times a day (BID) | ORAL | 0 refills | Status: DC | PRN

## 2017-10-09 ENCOUNTER — Other Ambulatory Visit: Payer: Self-pay | Admitting: Family Medicine

## 2017-10-16 ENCOUNTER — Other Ambulatory Visit: Payer: Self-pay | Admitting: Family Medicine

## 2017-10-16 DIAGNOSIS — G4733 Obstructive sleep apnea (adult) (pediatric): Secondary | ICD-10-CM | POA: Diagnosis not present

## 2017-10-20 DIAGNOSIS — J449 Chronic obstructive pulmonary disease, unspecified: Secondary | ICD-10-CM | POA: Diagnosis not present

## 2017-11-06 ENCOUNTER — Other Ambulatory Visit: Payer: Self-pay | Admitting: Family Medicine

## 2017-11-06 MED ORDER — HYDROCODONE-ACETAMINOPHEN 7.5-325 MG PO TABS: 1.0000 | ORAL_TABLET | Freq: Two times a day (BID) | ORAL | 0 refills | Status: DC | PRN

## 2017-11-06 NOTE — Telephone Encounter (Signed)
Refill on hydrocodone to [facility] [address]

## 2017-11-06 NOTE — Telephone Encounter (Signed)
Ok to refill??  Last office visit 08/07/2017.   Last refill 10/06/2017.

## 2017-11-16 ENCOUNTER — Other Ambulatory Visit: Payer: Self-pay | Admitting: Family Medicine

## 2017-11-16 DIAGNOSIS — G4733 Obstructive sleep apnea (adult) (pediatric): Secondary | ICD-10-CM | POA: Diagnosis not present

## 2017-11-20 DIAGNOSIS — J449 Chronic obstructive pulmonary disease, unspecified: Secondary | ICD-10-CM | POA: Diagnosis not present

## 2017-12-06 ENCOUNTER — Ambulatory Visit (INDEPENDENT_AMBULATORY_CARE_PROVIDER_SITE_OTHER): Payer: Medicare Other | Admitting: Family Medicine

## 2017-12-06 ENCOUNTER — Other Ambulatory Visit: Payer: Self-pay

## 2017-12-06 ENCOUNTER — Encounter: Payer: Self-pay | Admitting: Family Medicine

## 2017-12-06 VITALS — BP 126/82 | HR 72 | Temp 98.8°F | Ht 65.0 in | Wt 283.0 lb

## 2017-12-06 DIAGNOSIS — N950 Postmenopausal bleeding: Secondary | ICD-10-CM | POA: Diagnosis not present

## 2017-12-06 DIAGNOSIS — I10 Essential (primary) hypertension: Secondary | ICD-10-CM | POA: Diagnosis not present

## 2017-12-06 DIAGNOSIS — Z124 Encounter for screening for malignant neoplasm of cervix: Secondary | ICD-10-CM | POA: Diagnosis not present

## 2017-12-06 DIAGNOSIS — N898 Other specified noninflammatory disorders of vagina: Secondary | ICD-10-CM | POA: Diagnosis not present

## 2017-12-06 DIAGNOSIS — M10072 Idiopathic gout, left ankle and foot: Secondary | ICD-10-CM | POA: Diagnosis not present

## 2017-12-06 DIAGNOSIS — R7302 Impaired glucose tolerance (oral): Secondary | ICD-10-CM

## 2017-12-06 DIAGNOSIS — Z0000 Encounter for general adult medical examination without abnormal findings: Secondary | ICD-10-CM | POA: Diagnosis not present

## 2017-12-06 DIAGNOSIS — E7800 Pure hypercholesterolemia, unspecified: Secondary | ICD-10-CM | POA: Diagnosis not present

## 2017-12-06 LAB — WET PREP FOR TRICH, YEAST, CLUE

## 2017-12-06 MED ORDER — HYDROCODONE-ACETAMINOPHEN 7.5-325 MG PO TABS: 1.0000 | ORAL_TABLET | Freq: Two times a day (BID) | ORAL | 0 refills | Status: DC | PRN

## 2017-12-06 MED ORDER — ALBUTEROL SULFATE HFA 108 (90 BASE) MCG/ACT IN AERS: 2.0000 | INHALATION_SPRAY | RESPIRATORY_TRACT | 5 refills | Status: DC | PRN

## 2017-12-06 MED ORDER — TETANUS-DIPHTH-ACELL PERTUSSIS 5-2-15.5 LF-MCG/0.5 IM SUSP: 0.5000 mL | Freq: Once | INTRAMUSCULAR | 0 refills | Status: AC

## 2017-12-06 NOTE — Patient Instructions (Addendum)
Medications refilled We will call with lab results TDAP sent to pharmacy  F/U 4 months MBDixon

## 2017-12-06 NOTE — Progress Notes (Signed)
Subjective:   Patient presents for Medicare Annual/Subsequent preventive examination.   Pt here for Wellness  Medications reviewed   She would like an aide to help her cook and clean, her knees from OA/Back give her problems, also has myasthenia and gets weak spells.   She has noticed some brownish discharge, no heavy bleeding, history of post menopausal bleeding   Review Past Medical/Family/Social: Per EMR   Risk Factors  Current exercise habits: None Dietary issues discussed: YES  Cardiac risk factors: Obesity (BMI >= 30 kg/m2). HTN, Hyperlipidemia,glucose intolerance   Depression Screen  (Note: if answer to either of the following is "Yes", a more complete depression screening is indicated)  Over the past two weeks, have you felt down, depressed or hopeless? No Over the past two weeks, have you felt little interest or pleasure in doing things? No Have you lost interest or pleasure in daily life? No Do you often feel hopeless? No Do you cry easily over simple problems? No   Activities of Daily Living  In your present state of health, do you have any difficulty performing the following activities?:  Driving? No  Managing money? No  Feeding yourself? No  Getting from bed to chair? No  Climbing a flight of stairs? Yes  Preparing food and eating?:Yes  Bathing or showering? No  Getting dressed: No  Getting to the toilet? No  Using the toilet:No  Moving around from place to place: Yes In the past year have you fallen or had a near fall?:No  Are you sexually active? No  Do you have more than one partner? No   Hearing Difficulties:  Do you often ask people to speak up or repeat themselves? No  Do you experience ringing or noises in your ears? No Do you have difficulty understanding soft or whispered voices? Yes  Do you feel that you have a problem with memory? No Do you often misplace items? No  Do you feel safe at home? Yes  Cognitive Testing  Alert? Yes Normal  Appearance?Yes  Oriented to person? Yes Place? Yes  Time? Yes  Recall of three objects? Yes  Can perform simple calculations? Yes  Displays appropriate judgment?Yes  Can read the correct time from a watch face?Yes   List the Names of Other Physician/Practitioners you currently use:   Pulmonary , ophthalmology, Neurology      Screening Tests / Date Colonoscopy UTD                    Zostavax  UTD Mammogram  UTD Influenza Vaccine UTD Tetanus/tdap - Due  ROS: GEN- denies fatigue, fever, weight loss,weakness, recent illness HEENT- denies eye drainage, change in vision, nasal discharge, CVS- denies chest pain, palpitations RESP- denies SOB, cough, wheeze ABD- denies N/V, change in stools, abd pain GU- denies dysuria, hematuria, dribbling, incontinence MSK- + joint pain, muscle aches, injury Neuro- denies headache, dizziness, syncope, seizure activity  Physical: GEN- NAD, alert and oriented x3 HEENT- PERRL, EOMI, non injected sclera, pink conjunctiva, MMM, oropharynx clear Neck- Supple, no thryomegaly CVS- RRR, no murmur RESP-CTAB ABD-NABS,soft,NT,ND GU- normal external genitalia, vaginal mucosa pink and moist, cervix visualized no growth,+ blood form os, and in canal, friable cervix, brown  discharge, no CMT, no ovarian masses, uterus normal size EXT- No edema Pulses- Radial, DP- 2+  Assessment:    Annual wellness medicare exam   Plan:    During the course of the visit the patient was educated and counseled about appropriate screening and  preventive services including:  Post menopausal bleeding-evaluated back in 20 15/2016.  She had negative endometrial biopsy for postmenopausal bleeding.  She did have 2 fibroids noted but no treatment was needed to the areas.  She was started on Megace and has been continued on this for the past few years. Last Pap smear was normal in 2015.  Pap smear performed again.  She did have some gross bleeding on exam.  We will get her set back  up with a new GYN I have concerns about her still being on the Megace after 3 years.  To see if this is appropriate she may need treatment for fibroids if these have enlarged.  TDAP vaccine. Prescription given to that she can get the vaccine at the pharmacy or Medicare part D.  Screen negative for depression.  She is continued on trazodone for sleep   Chronic pain secondary to back pain degenerative disc disease osteoarthritis refilled her hydrocodone.  I think she could benefit from some help at home she is going to check with social services about her Medicaid things that needs to be activated.  Obesity-discussed eating habits with her.  She is eating very fatty high carb meals.  Concerned that she may have converted to be in a diabetic she previously had glucose intolerance especially with her 20 pound weight gain.  We will check this on her fasting labs today.   I aksed her to please have her cardioloist send records since we have none on file.  Diet review for nutrition referral? Yes ____ Not Indicated __x__  Patient Instructions (the written plan) was given to the patient.  Medicare Attestation  I have personally reviewed:  The patient's medical and social history  Their use of alcohol, tobacco or illicit drugs  Their current medications and supplements  The patient's functional ability including ADLs,fall risks, home safety risks, cognitive, and hearing and visual impairment  Diet and physical activities  Evidence for depression or mood disorders  The patient's weight, height, BMI, and visual acuity have been recorded in the chart. I have made referrals, counseling, and provided education to the patient based on review of the above and I have provided the patient with a written personalized care plan for preventive services.

## 2017-12-07 ENCOUNTER — Telehealth: Payer: Self-pay | Admitting: *Deleted

## 2017-12-07 ENCOUNTER — Other Ambulatory Visit: Payer: Self-pay | Admitting: Family Medicine

## 2017-12-07 DIAGNOSIS — N950 Postmenopausal bleeding: Secondary | ICD-10-CM

## 2017-12-07 LAB — CBC WITH DIFFERENTIAL/PLATELET
BASOS PCT: 0.9 %
Basophils Absolute: 82 cells/uL (ref 0–200)
EOS ABS: 491 {cells}/uL (ref 15–500)
Eosinophils Relative: 5.4 %
HEMATOCRIT: 37.4 % (ref 35.0–45.0)
Hemoglobin: 12.4 g/dL (ref 11.7–15.5)
LYMPHS ABS: 2685 {cells}/uL (ref 850–3900)
MCH: 29.8 pg (ref 27.0–33.0)
MCHC: 33.2 g/dL (ref 32.0–36.0)
MCV: 89.9 fL (ref 80.0–100.0)
MPV: 9.8 fL (ref 7.5–12.5)
Monocytes Relative: 7.9 %
NEUTROS PCT: 56.3 %
Neutro Abs: 5123 cells/uL (ref 1500–7800)
Platelets: 283 10*3/uL (ref 140–400)
RBC: 4.16 10*6/uL (ref 3.80–5.10)
RDW: 12.9 % (ref 11.0–15.0)
TOTAL LYMPHOCYTE: 29.5 %
WBC mixed population: 719 cells/uL (ref 200–950)
WBC: 9.1 10*3/uL (ref 3.8–10.8)

## 2017-12-07 LAB — COMPREHENSIVE METABOLIC PANEL
AG Ratio: 1.2 (calc) (ref 1.0–2.5)
ALBUMIN MSPROF: 4 g/dL (ref 3.6–5.1)
ALKALINE PHOSPHATASE (APISO): 85 U/L (ref 33–130)
ALT: 12 U/L (ref 6–29)
AST: 14 U/L (ref 10–35)
BUN: 14 mg/dL (ref 7–25)
CALCIUM: 9.3 mg/dL (ref 8.6–10.4)
CHLORIDE: 105 mmol/L (ref 98–110)
CO2: 31 mmol/L (ref 20–32)
Creat: 0.91 mg/dL (ref 0.50–0.99)
GLOBULIN: 3.3 g/dL (ref 1.9–3.7)
Glucose, Bld: 81 mg/dL (ref 65–99)
POTASSIUM: 4.9 mmol/L (ref 3.5–5.3)
Sodium: 142 mmol/L (ref 135–146)
Total Bilirubin: 0.6 mg/dL (ref 0.2–1.2)
Total Protein: 7.3 g/dL (ref 6.1–8.1)

## 2017-12-07 LAB — LIPID PANEL
CHOLESTEROL: 179 mg/dL (ref <200)
HDL: 41 mg/dL — AB (ref >50)
LDL Cholesterol (Calc): 112 mg/dL (calc) — ABNORMAL HIGH
Non-HDL Cholesterol (Calc): 138 mg/dL (calc) — ABNORMAL HIGH (ref <130)
Total CHOL/HDL Ratio: 4.4 (calc) (ref <5.0)
Triglycerides: 145 mg/dL (ref <150)

## 2017-12-07 LAB — PAP IG W/ RFLX HPV ASCU

## 2017-12-07 LAB — HEMOGLOBIN A1C
EAG (MMOL/L): 6.3 (calc)
HEMOGLOBIN A1C: 5.6 %{Hb} (ref <5.7)
MEAN PLASMA GLUCOSE: 114 (calc)

## 2017-12-07 NOTE — Telephone Encounter (Signed)
Patient seen in office for appointment. Requested MD to contact insurance about [place].   Call placed to [facility] ([phone] telephone). Was advised that [facility] covers [facility] [employer] if [place] services. Advised that plan does not cover custodial care (bathing, dressing, cleaning, cooking, etc) without skilled services.

## 2017-12-08 NOTE — Telephone Encounter (Signed)
Remind pt to go to [facility] and active medicaid Her Insurance will not cover someone to help her cook and clean

## 2017-12-08 NOTE — Telephone Encounter (Signed)
Call placed to patient.   Reports that she called DSS and was informed that she is dual eligible, but Medicaid will only cover Medicare premiums.   Advised patient of coverage issues.

## 2017-12-12 ENCOUNTER — Other Ambulatory Visit: Payer: Self-pay | Admitting: Family Medicine

## 2017-12-16 DIAGNOSIS — G4733 Obstructive sleep apnea (adult) (pediatric): Secondary | ICD-10-CM | POA: Diagnosis not present

## 2017-12-19 ENCOUNTER — Other Ambulatory Visit: Payer: Self-pay | Admitting: Family Medicine

## 2017-12-20 ENCOUNTER — Other Ambulatory Visit: Payer: Self-pay

## 2017-12-20 ENCOUNTER — Ambulatory Visit (INDEPENDENT_AMBULATORY_CARE_PROVIDER_SITE_OTHER): Payer: Medicare Other | Admitting: Family Medicine

## 2017-12-20 VITALS — BP 160/84 | HR 87 | Temp 98.5°F | Resp 17 | Ht 65.0 in | Wt 283.0 lb

## 2017-12-20 DIAGNOSIS — R609 Edema, unspecified: Secondary | ICD-10-CM | POA: Diagnosis not present

## 2017-12-20 DIAGNOSIS — J449 Chronic obstructive pulmonary disease, unspecified: Secondary | ICD-10-CM | POA: Diagnosis not present

## 2017-12-20 NOTE — Progress Notes (Signed)
Patient ID: [name], female    DOB: [birth year], 67 y.o.   MRN: [id number]  PCP: Alycia Rossetti, MD  Chief Complaint  Patient presents with  . Foot Swelling    Onset Monday    Subjective:   [name] is a 67 y.o. female, presents to clinic with CC of bilateral lower extremity edema for 2 days that is already improving.  Swelling she first noticed on Monday that her bilateral feet ankle since shins.  She elevated her legs and it has started to improve.  She does have a history of lower extremity edema.  She is on Lasix.  She states she did eat some chips on Monday but denies eating any other salty foods or different foods over the holiday weekend.  She denies any shortness of breath, orthopnea, palpitations, near syncope.  Her other chronic medical conditions and her fatigue are unchanged from baseline and unchanged from her baseline activity level. She denies erythema, pain, numbness, tingling, rash, chest pain, shortness of breath or weight change.   Patient Active Problem List   Diagnosis Date Noted  . Asthma 05/14/2015  . Heel spur 02/24/2015  . Depression 02/24/2015  . Post-menopausal bleeding 06/02/2014  . OA (osteoarthritis) of knee 04/09/2014  . Epidermal cyst 10/01/2013  . Morbid obesity ([place]) 02/25/2013  . Back pain 03/04/2012  . Gout 01/25/2012  . Glucose intolerance (impaired glucose tolerance) 04/01/2011  . Edema 01/28/2011  . OBESITY HYPOVENTILATION SYNDROME 06/16/2008  . OBSTRUCTIVE SLEEP APNEA 05/08/2008  . Hyperlipidemia 08/28/2006  . Essential hypertension 08/28/2006  . GERD 08/28/2006  . Osteoarthritis 08/28/2006     Prior to Admission medications   Medication Sig Start Date End Date Taking? Authorizing Provider  acetaminophen (TYLENOL) 325 MG tablet Take 650 mg by mouth every 6 (six) hours as needed for mild pain.   Yes [provider]  albuterol (PROVENTIL HFA;VENTOLIN HFA) 108 (90 Base) MCG/ACT inhaler Inhale 2 puffs into  the lungs every 4 (four) hours as needed for wheezing or shortness of breath. 12/06/17  Yes [place], Modena Nunnery, MD  allopurinol (ZYLOPRIM) 100 MG tablet TAKE 1 TABLET BY MOUTH DAILY 12/19/17  Yes [place], Modena Nunnery, MD  aspirin EC 81 MG tablet Take 81 mg by mouth daily.   Yes [provider]  atorvastatin (LIPITOR) 10 MG tablet TAKE 1 TABLET(10 MG) BY MOUTH DAILY 10/10/17  Yes [place], Modena Nunnery, MD  clotrimazole-betamethasone (LOTRISONE) cream Apply 1 application topically 2 (two) times daily as needed. To be applied to the feet as needed 02/26/14  Yes [place], Modena Nunnery, MD  diphenhydrAMINE (BENADRYL) 25 MG tablet Take 25 mg by mouth daily.   Yes [provider]  fluticasone (CUTIVATE) 0.05 % cream APPLY EXTERNALLY TO THE AFFECTED AREA DAILY 08/07/17  Yes [place], Modena Nunnery, MD  fluticasone [facility]) 50 MCG/ACT nasal spray Place 1 spray into both nostrils 2 (two) times daily. Patient taking differently: Place 1 spray into both nostrils 2 (two) times daily as needed for allergies.  08/11/15  Yes Javier Glazier, MD  furosemide (LASIX) 20 MG tablet TAKE 1 TABLET(20 MG) BY MOUTH DAILY FOR SWELLING 10/10/17  Yes [place], Modena Nunnery, MD  HYDROcodone-acetaminophen (NORCO) 7.5-325 MG tablet Take 1 tablet by mouth 2 (two) times daily as needed for moderate pain. 12/06/17  Yes [place], Modena Nunnery, MD  losartan (COZAAR) 50 MG tablet TAKE 1 TABLET BY MOUTH DAILY 12/12/17  Yes , Modena Nunnery, MD  megestrol (MEGACE) 40 MG tablet  TAKE 1 TABLET(40 MG) BY MOUTH DAILY 12/12/17  Yes [place], Modena Nunnery, MD  NON FORMULARY at bedtime. CPAP and 1 lpm oxygen qhs   Yes [provider]  pyridostigmine (MESTINON) 60 MG tablet Take 1 tablet (60 mg total) by mouth 3 (three) times daily. 07/05/17  Yes [place], Earlean Polka, MD  ranitidine (ZANTAC) 150 MG tablet TAKE 1 TABLET BY MOUTH EVERY NIGHT AT BEDTIME 10/16/17  Yes [place], Modena Nunnery, MD  traZODone (DESYREL) 50 MG tablet TAKE 1/2 TO 1 TABLET(25 TO 50 MG) BY MOUTH  AT BEDTIME AS NEEDED FOR SLEEP 11/16/17  Yes [place], Modena Nunnery, MD     No Known Allergies   Family History  Problem Relation Age of Onset  . Heart disease Mother   . Thyroid disease Mother   . Heart failure Father   . Lung disease Father   . Diabetes Brother   . Crohn's disease Daughter      Social History   Socioeconomic History  . Marital status: Widowed    Spouse name: Not on file  . Number of children: 3  . Years of education: 68  . Highest education level: Not on file  Occupational History  . Occupation: disabled    Fish farm manager: UNEMPLOYED  Social Needs  . Financial resource strain: Not on file  . Food insecurity:    Worry: Not on file    Inability: Not on file  . Transportation needs:    Medical: Not on file    Non-medical: Not on file  Tobacco Use  . Smoking status: Former Smoker    Packs/day: 2.00    Years: 36.00    Pack years: 72.00    Types: Cigarettes    Start date: 06/25/1969    Last attempt to quit: 03/25/2005    Years since quitting: 12.7  . Smokeless tobacco: Never Used  Substance and Sexual Activity  . Alcohol use: No    Alcohol/week: 0.0 oz  . Drug use: No  . Sexual activity: Never    Birth control/protection: Post-menopausal  Lifestyle  . Physical activity:    Days per week: Not on file    Minutes per session: Not on file  . Stress: Not on file  Relationships  . Social connections:    Talks on phone: Not on file    Gets together: Not on file    Attends religious service: Not on file    Active member of club or organization: Not on file    Attends meetings of clubs or organizations: Not on file    Relationship status: Not on file  . Intimate partner violence:    Fear of current or ex partner: Not on file    Emotionally abused: Not on file    Physically abused: Not on file    Forced sexual activity: Not on file  Other Topics Concern  . Not on file  Social History Narrative   Originally from [place]. She has always lived in [place]. 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 No pets currently. No bird, mold, or hot tub exposure.    Lives alone   No caffeine     Review of Systems  Constitutional: Negative for activity change, appetite change, chills, diaphoresis and fever.  Genitourinary: Negative.  Negative for decreased urine volume and urgency.  All other systems reviewed and are negative.      Objective:    Vitals:   12/20/17 1406  BP: (!) 160/84  Pulse: 87  Resp: 17  Temp: 98.5 F (36.9 C)  TempSrc: Oral  SpO2: 94%  Weight: 283 lb (128.4 kg)  Height: 5\' 5"  (1.651 m)      Physical Exam  Constitutional: She appears well-developed. No distress.  Obese elderly female, appears older than stated age, alert, nontoxic-appearing, no acute distress  HENT:  Head: Normocephalic and atraumatic.  Nose: Nose normal.  Mouth/Throat: Oropharynx is clear and moist.  Eyes: Conjunctivae are normal. Right eye exhibits no discharge. Left eye exhibits no discharge.  Neck: Normal range of motion. Neck supple. No JVD present. No tracheal deviation present.  Cardiovascular: Normal rate, regular rhythm, normal heart sounds and intact distal pulses. Exam reveals no gallop and no friction rub.  No murmur heard. Bilateral lower extremity edema, 1+ pretibial, ankle and pedal  Pulmonary/Chest: Effort normal. No stridor. No respiratory distress. She has no wheezes. She has no rales. She exhibits no tenderness.  Abdominal: Soft. Bowel sounds are normal. She exhibits no distension and no mass. There is no tenderness. There is no guarding.  Musculoskeletal: Normal range of motion.  Lymphadenopathy:    She has no cervical adenopathy.  Neurological: She is alert. She exhibits normal muscle tone. Coordination normal.  Skin: Skin is warm and dry. Capillary refill takes less than 2 seconds. No rash noted. She is not diaphoretic.  Psychiatric: She has a normal mood and affect. Her behavior is normal.  Nursing  note and vitals reviewed.         Assessment & Plan:      ICD-10-CM   1. Dependent edema R60.9     Patient presents with complaint of edema that worsened from her baseline 3 days ago and is already and started to improve.  No other associated symptoms.  No concern for fluid overload, patient has had no weight increase since her most recent visit, 2 weeks ago.  She does take Lasix already.  Will double Lasix dose for 3 days and then return to her normal dose of 20 mg daily.  Patient may benefit from compression stockings, although do not believe she be able to get them on independently and would likely require some help.  Discussed avoiding high sodium foods, taking all her medications as prescribed.,  Follow-up with your PCP.  She has no other signs concerning for CHF, but we did discuss returning signs and symptoms of fluid overload and patient verbalized understanding of ER precautions, or when to return to clinic immediately.  BP was elevated during visit, she denied any headache, palpitations, chest pain, shortness of breath, change in urine output.  Patient should monitor, return for recheck in 1 to 2 weeks if home blood pressures are not back to the baseline.    Delsa Grana, PA-C 12/20/17 2:16 PM

## 2017-12-20 NOTE — Patient Instructions (Addendum)
Double your lasix dose for the next three days (40 mg) and then resume your normal lasix dose after that.    OR  You can work on diet, avoid salty foods, and wear compression stockings, elevate legs.  They should get better.    Please call us immediately or go to the ER if you have any shortness of breath or chest pain.    Peripheral Edema Peripheral edema is swelling that is caused by a buildup of fluid. Peripheral edema most often affects the lower legs, ankles, and feet. It can also develop in the arms, hands, and face. The area of the body that has peripheral edema will look swollen. It may also feel heavy or warm. Your clothes may start to feel tight. Pressing on the area may make a temporary dent in your skin. You may not be able to move your arm or leg as much as usual. There are many causes of peripheral edema. It can be a complication of other diseases, such as congestive heart failure, kidney disease, or a problem with your blood circulation. It also can be a side effect of certain medicines. It often happens to women during pregnancy. Sometimes, the cause is not known. Treating the underlying condition is often the only treatment for peripheral edema. Follow these instructions at home: Pay attention to any changes in your symptoms. Take these actions to help with your discomfort:  Raise (elevate) your legs while you are sitting or lying down.  Move around often to prevent stiffness and to lessen swelling. Do not sit or stand for long periods of time.  Wear support stockings as told by your health care provider.  Follow instructions from your health care provider about limiting salt (sodium) in your diet. Sometimes eating less salt can reduce swelling.  Take over-the-counter and prescription medicines only as told by your health care provider. Your health care provider may prescribe medicine to help your body get rid of excess water (diuretic).  Keep all follow-up visits as told  by your health care provider. This is important.  Contact a health care provider if:  You have a fever.  Your edema starts suddenly or is getting worse, especially if you are pregnant or have a medical condition.  You have swelling in only one leg.  You have increased swelling and pain in your legs. Get help right away if:  You develop shortness of breath, especially when you are lying down.  You have pain in your chest or abdomen.  You feel weak.  You faint. This information is not intended to replace advice given to you by your health care provider. Make sure you discuss any questions you have with your health care provider. Document Released: 08/18/2004 Document Revised: 12/14/2015 Document Reviewed: 01/21/2015 Elsevier Interactive Patient Education  2018 [place] DASH stands for "Dietary Approaches to Stop Hypertension." The DASH eating plan is a healthy eating plan that has been shown to reduce high blood pressure (hypertension). It may also reduce your risk for type 2 diabetes, heart disease, and stroke. The DASH eating plan may also help with weight loss. What are tips for following this plan? General guidelines  Avoid eating more than 2,300 mg (milligrams) of salt (sodium) a day. If you have hypertension, you may need to reduce your sodium intake to 1,500 mg a day.  Limit alcohol intake to no more than 1 drink a day for nonpregnant women and 2 drinks a day for men.  One drink equals 12 oz of beer, 5 oz of wine, or 1 oz of hard liquor.  Work with your health care provider to maintain a healthy body weight or to lose weight. Ask what an ideal weight is for you.  Get at least 30 minutes of exercise that causes your heart to beat faster (aerobic exercise) most days of the week. Activities may include walking, swimming, or biking.  Work with your health care provider or diet and nutrition specialist (dietitian) to adjust your eating plan to your  individual calorie needs. Reading food labels  Check food labels for the amount of sodium per serving. Choose foods with less than 5 percent of the Daily Value of sodium. Generally, foods with less than 300 mg of sodium per serving fit into this eating plan.  To find whole grains, look for the word "whole" as the first word in the ingredient list. Shopping  Buy products labeled as "low-sodium" or "no salt added."  Buy fresh foods. Avoid canned foods and premade or frozen meals. Cooking  Avoid adding salt when cooking. Use salt-free seasonings or herbs instead of table salt or sea salt. Check with your health care provider or pharmacist before using salt substitutes.  Do not fry foods. Cook foods using healthy methods such as baking, boiling, grilling, and broiling instead.  Cook with heart-healthy oils, such as olive, canola, soybean, or sunflower oil. Meal planning   Eat a balanced diet that includes: ? 5 or more servings of fruits and vegetables each day. At each meal, try to fill half of your plate with fruits and vegetables. ? Up to 6-8 servings of whole grains each day. ? Less than 6 oz of lean meat, poultry, or fish each day. A 3-oz serving of meat is about the same size as a deck of cards. One egg equals 1 oz. ? 2 servings of low-fat dairy each day. ? A serving of nuts, seeds, or beans 5 times each week. ? Heart-healthy fats. Healthy fats called Omega-3 fatty acids are found in foods such as flaxseeds and coldwater fish, like sardines, salmon, and mackerel.  Limit how much you eat of the following: ? Canned or prepackaged foods. ? Food that is high in trans fat, such as fried foods. ? Food that is high in saturated fat, such as fatty meat. ? Sweets, desserts, sugary drinks, and other foods with added sugar. ? Full-fat dairy products.  Do not salt foods before eating.  Try to eat at least 2 vegetarian meals each week.  Eat more home-cooked food and less restaurant,  buffet, and fast food.  When eating at a restaurant, ask that your food be prepared with less salt or no salt, if possible. What foods are recommended? The items listed may not be a complete list. Talk with your dietitian about what dietary choices are best for you. Grains Whole-grain or whole-wheat bread. Whole-grain or whole-wheat pasta. Brown rice. [place]. Bulgur. Whole-grain and low-sodium cereals. Pita bread. Low-fat, low-sodium crackers. Whole-wheat flour tortillas. Vegetables Fresh or frozen vegetables (raw, steamed, roasted, or grilled). Low-sodium or reduced-sodium tomato and vegetable juice. Low-sodium or reduced-sodium tomato sauce and tomato paste. Low-sodium or reduced-sodium canned vegetables. Fruits All fresh, dried, or frozen fruit. Canned fruit in natural juice (without added sugar). Meat and other protein foods Skinless chicken or [place]. Ground chicken or [place]. Pork with fat trimmed off. Fish and seafood. Egg whites. Dried beans, peas, or lentils. Unsalted nuts, nut butters, and seeds. Unsalted canned beans.  Lean cuts of beef with fat trimmed off. Low-sodium, lean deli meat. Dairy Low-fat (1%) or fat-free (skim) milk. Fat-free, low-fat, or reduced-fat cheeses. Nonfat, low-sodium ricotta or cottage cheese. Low-fat or nonfat yogurt. Low-fat, low-sodium cheese. Fats and oils Soft margarine without trans fats. Vegetable oil. Low-fat, reduced-fat, or light mayonnaise and salad dressings (reduced-sodium). Canola, safflower, olive, soybean, and sunflower oils. Avocado. Seasoning and other foods Herbs. Spices. Seasoning mixes without salt. Unsalted popcorn and pretzels. Fat-free sweets. What foods are not recommended? The items listed may not be a complete list. Talk with your dietitian about what dietary choices are best for you. Grains Baked goods made with fat, such as croissants, muffins, or some breads. Dry pasta or rice meal packs. Vegetables Creamed or fried  vegetables. Vegetables in a cheese sauce. Regular canned vegetables (not low-sodium or reduced-sodium). Regular canned tomato sauce and paste (not low-sodium or reduced-sodium). Regular tomato and vegetable juice (not low-sodium or reduced-sodium). Angie Fava. Olives. Fruits Canned fruit in a light or heavy syrup. Fried fruit. Fruit in cream or butter sauce. Meat and other protein foods Fatty cuts of meat. Ribs. Fried meat. Berniece Salines. Sausage. Bologna and other processed lunch meats. Salami. Fatback. Hotdogs. Bratwurst. Salted nuts and seeds. Canned beans with added salt. Canned or smoked fish. Whole eggs or egg yolks. Chicken or [place] with skin. Dairy Whole or 2% milk, cream, and half-and-half. Whole or full-fat cream cheese. Whole-fat or sweetened yogurt. Full-fat cheese. Nondairy creamers. Whipped toppings. Processed cheese and cheese spreads. Fats and oils Butter. Stick margarine. Lard. Shortening. Ghee. Bacon fat. Tropical oils, such as coconut, palm kernel, or palm oil. Seasoning and other foods Salted popcorn and pretzels. Onion salt, garlic salt, seasoned salt, table salt, and sea salt. Worcestershire sauce. Tartar sauce. Barbecue sauce. Teriyaki sauce. Soy sauce, including reduced-sodium. Steak sauce. Canned and packaged gravies. Fish sauce. Oyster sauce. Cocktail sauce. Horseradish that you find on the shelf. Ketchup. Mustard. Meat flavorings and tenderizers. Bouillon cubes. Hot sauce and Tabasco sauce. Premade or packaged marinades. Premade or packaged taco seasonings. Relishes. Regular salad dressings. Where to find more information:  National Heart, Lung, and [place]: https://wilson-eaton.com/  American Heart Association: www.heart.org Summary  The DASH eating plan is a healthy eating plan that has been shown to reduce high blood pressure (hypertension). It may also reduce your risk for type 2 diabetes, heart disease, and stroke.  With the DASH eating plan, you should limit salt (sodium)  intake to 2,300 mg a day. If you have hypertension, you may need to reduce your sodium intake to 1,500 mg a day.  When on the DASH eating plan, aim to eat more fresh fruits and vegetables, whole grains, lean proteins, low-fat dairy, and heart-healthy fats.  Work with your health care provider or diet and nutrition specialist (dietitian) to adjust your eating plan to your individual calorie needs. This information is not intended to replace advice given to you by your health care provider. Make sure you discuss any questions you have with your health care provider. Document Released: 06/30/2011 Document Revised: 07/04/2016 Document Reviewed: 07/04/2016 Elsevier Interactive Patient Education  [employer].

## 2017-12-26 ENCOUNTER — Emergency Department (HOSPITAL_COMMUNITY)
Admission: EM | Admit: 2017-12-26 | Discharge: 2017-12-26 | Disposition: A | Discharge status: Home / Self Care | Payer: Medicare Other | Attending: Emergency Medicine | Admitting: Emergency Medicine

## 2017-12-26 ENCOUNTER — Other Ambulatory Visit: Payer: Self-pay

## 2017-12-26 ENCOUNTER — Encounter (HOSPITAL_COMMUNITY): Payer: Self-pay | Admitting: Emergency Medicine

## 2017-12-26 DIAGNOSIS — E785 Hyperlipidemia, unspecified: Secondary | ICD-10-CM | POA: Diagnosis not present

## 2017-12-26 DIAGNOSIS — R109 Unspecified abdominal pain: Secondary | ICD-10-CM

## 2017-12-26 DIAGNOSIS — Z79899 Other long term (current) drug therapy: Secondary | ICD-10-CM | POA: Insufficient documentation

## 2017-12-26 DIAGNOSIS — R1084 Generalized abdominal pain: Secondary | ICD-10-CM | POA: Insufficient documentation

## 2017-12-26 DIAGNOSIS — I10 Essential (primary) hypertension: Secondary | ICD-10-CM | POA: Insufficient documentation

## 2017-12-26 DIAGNOSIS — Z87891 Personal history of nicotine dependence: Secondary | ICD-10-CM | POA: Insufficient documentation

## 2017-12-26 DIAGNOSIS — Z7982 Long term (current) use of aspirin: Secondary | ICD-10-CM | POA: Insufficient documentation

## 2017-12-26 DIAGNOSIS — J45909 Unspecified asthma, uncomplicated: Secondary | ICD-10-CM | POA: Diagnosis not present

## 2017-12-26 LAB — URINALYSIS, ROUTINE W REFLEX MICROSCOPIC
BILIRUBIN URINE: NEGATIVE
Glucose, UA: NEGATIVE mg/dL
HGB URINE DIPSTICK: NEGATIVE
KETONES UR: NEGATIVE mg/dL
NITRITE: NEGATIVE
PH: 5 (ref 5.0–8.0)
Protein, ur: NEGATIVE mg/dL
SPECIFIC GRAVITY, URINE: 1.014 (ref 1.005–1.030)

## 2017-12-26 MED ORDER — KETOROLAC TROMETHAMINE 60 MG/2ML IM SOLN
30.0000 mg | Freq: Once | INTRAMUSCULAR | Status: AC
  Administered 2017-12-26: 30 mg via INTRAMUSCULAR
  Filled 2017-12-26: qty 2

## 2017-12-26 MED ORDER — METHOCARBAMOL 500 MG PO TABS
500.0000 mg | ORAL_TABLET | Freq: Once | ORAL | Status: AC
  Administered 2017-12-26: 500 mg via ORAL
  Filled 2017-12-26: qty 1

## 2017-12-26 MED ORDER — METHOCARBAMOL 500 MG PO TABS: 500.0000 mg | ORAL_TABLET | Freq: Two times a day (BID) | ORAL | 0 refills | Status: DC | PRN

## 2017-12-26 NOTE — ED Triage Notes (Signed)
Pt c/o right flank pain x one week with some diarrhea.

## 2017-12-26 NOTE — ED Provider Notes (Signed)
Emergency Department Provider Note   I have reviewed the triage vital signs and the nursing notes.   HISTORY  Chief Complaint Flank Pain   HPI [name] is a 67 y.o. female with pain on her right side.  She states this is been there for about a week.  It is there every day but seems to be worse with movement better with rest.  She denied any urinary symptoms.  She had 2-3 episodes of loose stools but no consistent diarrhea or constipation.  She is got a patch that smells like as menthol on it that seems to help with the pain but has not tried anything else.  States that she does have back pain in the past but had negative studies that were done.  No numbness or weakness.  Able to ambulate without difficulty aside from the pain.  No midline back tenderness.  No rash.  No other associated or modifying symptoms.    Past Medical History:  Diagnosis Date  . Achilles tendinitis   . Anxiety   . Asthma   . Depression   . GERD (gastroesophageal reflux disease)   . Gout   . H/O myasthenia gravis    left eye  . HTN (hypertension)   . Hyperlipidemia   . Low back pain   . Obesity hypoventilation syndrome ([place])   . Obstructive sleep apnea   . Osteoarthritis     Patient Active Problem List   Diagnosis Date Noted  . Asthma 05/14/2015  . Heel spur 02/24/2015  . Depression 02/24/2015  . Post-menopausal bleeding 06/02/2014  . OA (osteoarthritis) of knee 04/09/2014  . Epidermal cyst 10/01/2013  . Morbid obesity ([place]) 02/25/2013  . Back pain 03/04/2012  . Gout 01/25/2012  . Glucose intolerance (impaired glucose tolerance) 04/01/2011  . Edema 01/28/2011  . OBESITY HYPOVENTILATION SYNDROME 06/16/2008  . OBSTRUCTIVE SLEEP APNEA 05/08/2008  . Hyperlipidemia 08/28/2006  . Essential hypertension 08/28/2006  . GERD 08/28/2006  . Osteoarthritis 08/28/2006    Past Surgical History:  Procedure Laterality Date  . CARDIAC CATHETERIZATION  2006  . CATARACT EXTRACTION W/PHACO Left  09/17/2015   Procedure: CATARACT EXTRACTION PHACO AND INTRAOCULAR LENS PLACEMENT LEFT EYE cde=8.58;  Surgeon: Tonny Branch, MD;  Location: [facility];  Service: Ophthalmology;  Laterality: Left;  . CATARACT EXTRACTION W/PHACO Right 05/15/2017   Procedure: CATARACT EXTRACTION PHACO AND INTRAOCULAR LENS PLACEMENT (IOC);  Surgeon: Tonny Branch, MD;  Location: [facility];  Service: Ophthalmology;  Laterality: Right;  CDE: 6.34  . COLONOSCOPY N/A 12/25/2013   Procedure: COLONOSCOPY;  Surgeon: Rogene Houston, MD;  Location: [facility];  Service: Endoscopy;  Laterality: N/A;  730-rescheduled Ann notified pt  . CYST EXCISION N/A 09/12/2016   Procedure: EXCISION SEBACEOUS CYST, BACK;  Surgeon: Aviva Signs, MD;  Location: [facility];  Service: General;  Laterality: N/A;  . INTRAOCULAR LENS INSERTION Bilateral 2018   Dr. Tonny Branch   . TUBAL LIGATION      Current Outpatient Rx  . Order #: [id number] Class: Historical Med  . Order #: [id number] Class: Normal  . Order #: [id number] Class: Normal  . Order #: [id number] Class: Historical Med  . Order #: [id number] Class: Normal  . Order #: [id number] Class: Historical Med  . Order #: [id number] Class: Normal  . Order #: [id number] Class: Normal  . Order #: [id number] Class: Normal  . Order #: [id number] Class: Normal  . Order #: [id number] Class: Normal  . Order #: [id number] Class: Historical Med  . Order #: [id number] Class: Normal  . Order #: [id number] Class:  Normal  . Order #: [id number] Class: Normal  . Order #: [id number] Class: Print    Allergies Patient has no known allergies.  Family History  Problem Relation Age of Onset  . Heart disease Mother   . Thyroid disease Mother   . Heart failure Father   . Lung disease Father   . Diabetes Brother   . Crohn's disease Daughter     Social History Social History   Tobacco Use  . Smoking status: Former Smoker    Packs/day: 2.00    Years: 36.00    Pack years: 72.00    Types: Cigarettes    Start date: 06/25/1969    Last  attempt to quit: 03/25/2005    Years since quitting: 12.7  . Smokeless tobacco: Never Used  Substance Use Topics  . Alcohol use: No    Alcohol/week: 0.0 oz  . Drug use: No    Review of Systems  All other systems negative except as documented in the HPI. All pertinent positives and negatives as reviewed in the HPI. ____________________________________________   PHYSICAL EXAM:  VITAL SIGNS: ED Triage Vitals  Enc Vitals Group     BP 12/26/17 1949 (!) 149/80     Pulse Rate 12/26/17 1949 72     Resp 12/26/17 1949 19     Temp 12/26/17 1949 98.4 F (36.9 C)     Temp Source 12/26/17 1949 Oral     SpO2 12/26/17 1949 97 %     Weight 12/26/17 1950 283 lb (128.4 kg)     Height 12/26/17 1950 5\' 5"  (1.651 m)     Head Circumference --      Peak Flow --      Pain Score 12/26/17 1949 10     Pain Loc --      Pain Edu? --      Excl. in [place]? --     Constitutional: Alert and oriented. Well appearing and in no acute distress. Eyes: Conjunctivae are normal. PERRL. EOMI. Head: Atraumatic. Nose: No congestion/rhinnorhea. Mouth/Throat: Mucous membranes are moist.  Oropharynx non-erythematous. Neck: No stridor.  No meningeal signs.   Cardiovascular: Normal rate, regular rhythm. Good peripheral circulation. Grossly normal heart sounds.   Respiratory: Normal respiratory effort.  No retractions. Lungs CTAB. Gastrointestinal: Soft and nontender. No distention.  Musculoskeletal: No lower extremity tenderness nor edema. No gross deformities of extremities.  No tenderness in right flank.  However she does have pain there with range of motion.  No pain at rest. Neurologic:  Normal speech and language. No gross focal neurologic deficits are appreciated.  Skin:  Skin is warm, dry and intact. No rash noted.   ____________________________________________   LABS (all labs ordered are listed, but only abnormal results are displayed)  Labs Reviewed  URINALYSIS, ROUTINE W REFLEX MICROSCOPIC - Abnormal;  Notable for the following components:      Result Value   Leukocytes, UA SMALL (*)    Bacteria, UA RARE (*)    All other components within normal limits  URINE CULTURE   ____________________________________________   INITIAL IMPRESSION / ASSESSMENT AND PLAN / ED COURSE  Suspect musculoskeletal cause for her symptoms.  We will check a urine just to be sure however she does not have any CVA tenderness or suprapubic tenderness.  No urinary symptoms.  Suspect MSK. Urine with a couple abnormalities, sent for culture.    Pertinent labs & imaging results that were available during my care of the patient were reviewed by me and considered in my  medical decision making (see chart for details).  ____________________________________________  FINAL CLINICAL IMPRESSION(S) / ED DIAGNOSES  Final diagnoses:  Flank pain     MEDICATIONS GIVEN DURING THIS VISIT:  Medications  methocarbamol (ROBAXIN) tablet 500 mg (500 mg Oral Given 12/26/17 2019)  ketorolac (TORADOL) injection 30 mg (30 mg Intramuscular Given 12/26/17 2020)     NEW OUTPATIENT MEDICATIONS STARTED DURING THIS VISIT:  Discharge Medication List as of 12/26/2017 10:53 PM    START taking these medications   Details  methocarbamol (ROBAXIN) 500 MG tablet Take 1 tablet (500 mg total) by mouth 2 (two) times daily as needed for muscle spasms., Starting Tue 12/26/2017, Print        Note:  This note was prepared with assistance of Dragon voice recognition software. Occasional wrong-word or sound-a-like substitutions may have occurred due to the inherent limitations of voice recognition software.   Courtenay Creger, [name], MD 12/27/17 [phone]

## 2017-12-28 ENCOUNTER — Encounter: Payer: Self-pay | Admitting: Obstetrics & Gynecology

## 2017-12-28 ENCOUNTER — Ambulatory Visit (INDEPENDENT_AMBULATORY_CARE_PROVIDER_SITE_OTHER): Payer: Medicare Other | Admitting: Obstetrics & Gynecology

## 2017-12-28 VITALS — BP 146/88 | Ht 64.5 in | Wt 286.0 lb

## 2017-12-28 DIAGNOSIS — N950 Postmenopausal bleeding: Secondary | ICD-10-CM | POA: Diagnosis not present

## 2017-12-28 LAB — URINE CULTURE

## 2017-12-28 NOTE — Addendum Note (Signed)
Addended by: Thurnell Garbe A on: 12/28/2017 11:28 AM   Modules accepted: Orders

## 2017-12-28 NOTE — Progress Notes (Signed)
    [name] 12/17/1950 [id number]        67 y.o.  G3P3L3  RP: Referred for evaluation of Postmenopausal bleeding  HPI:  PMB 4 years ago.  EBx 04/2014 showed Markedly degenerating endometrium.  Patient was started on Megace 40 mg 1 tab daily since then, still on it.  Started having mild brownish vaginal bleeding in 11/2017.  Seen by Dr Annye English MD for her Annual exam at that time.  Pap test negative 12/06/2017.  No pelvic pain.  Abstinent.   OB History  Gravida Para Term Preterm AB Living  3 3 3     3   SAB TAB Ectopic Multiple Live Births               # Outcome Date GA Lbr Len/2nd Weight Sex Delivery Anes PTL Lv  3 Term           2 Term           1 Term             Past medical history,surgical history, problem list, medications, allergies, family history and social history were all reviewed and documented in the EPIC chart.   Directed ROS with pertinent positives and negatives documented in the history of present illness/assessment and plan.  Exam:  Vitals:   12/28/17 1025  BP: (!) 146/88  Weight: 286 lb (129.7 kg)  Height: 5' 4.5" (1.638 m)   General appearance:  Normal  Abdomen: Normal  Gynecologic exam: Vulva normal.  Speculum:  Cervix/Vagina normal.  Normal secretions, no current bleeding.  Bimanual exam:  Uterus AV/No Adnexal mass felt, NT.  Limited by Obesity.  Endometrial Biopsy after informed consent.  Betadine prep and Hurricane spray on cervix.  Tenaculum on anterior lip of cervix.  Easy insertion of endometrial biopsy cannula.  Endometrial biopsy performed on all surfaces with suction.  Specimen moderate to abundant.  Sent to pathology.  All instruments removed.  Well-tolerated.   Assessment/Plan:  67 y.o. G3P3003   1. Postmenopausal bleeding Recurrence of postmenopausal bleeding on Megace for more than 3 years.  No other forms of hormone replacement therapy.  Endometrial biopsy performed today successfully.  Pending pathology.  Patient will follow  up with a pelvic ultrasound to complete the investigation.  Will stop Megace now.  Further management per results. - US Transvaginal Non-OB; Future  Counseling on above issues and coordination of care more than 50% for 30 minutes.  Princess Bruins MD, 10:36 AM 12/28/2017

## 2017-12-28 NOTE — Patient Instructions (Signed)
1. Postmenopausal bleeding Recurrence of postmenopausal bleeding on Megace for more than 3 years.  No other forms of hormone replacement therapy.  Endometrial biopsy performed today successfully.  Pending pathology.  Patient will follow up with a pelvic ultrasound to complete the investigation.  Will stop Megace now.  Further management per results. - US Transvaginal Non-OB; Future  Jemimah, it was a pleasure meeting you today!  I will inform you of your results as soon as they are available.

## 2018-01-01 ENCOUNTER — Telehealth: Payer: Self-pay | Admitting: *Deleted

## 2018-01-01 LAB — TISSUE SPECIMEN

## 2018-01-01 LAB — PATHOLOGY

## 2018-01-01 NOTE — Telephone Encounter (Signed)
Spoke with patient and advised her that her Wed afternoon FU needs to be rescheduled. Rescheduled her for tomorrow at 10:30 am. Advised she bring new insurance card, updated med list or her medications. She verbalized understanding.

## 2018-01-02 ENCOUNTER — Ambulatory Visit (INDEPENDENT_AMBULATORY_CARE_PROVIDER_SITE_OTHER): Payer: Medicare Other | Admitting: Diagnostic Neuroimaging

## 2018-01-02 ENCOUNTER — Encounter: Payer: Self-pay | Admitting: Diagnostic Neuroimaging

## 2018-01-02 VITALS — BP 131/70 | HR 87 | Ht 64.5 in | Wt 282.2 lb

## 2018-01-02 DIAGNOSIS — G7000 Myasthenia gravis without (acute) exacerbation: Secondary | ICD-10-CM | POA: Diagnosis not present

## 2018-01-02 MED ORDER — PYRIDOSTIGMINE BROMIDE 60 MG PO TABS: 60.0000 mg | ORAL_TABLET | Freq: Three times a day (TID) | ORAL | 4 refills | Status: DC

## 2018-01-02 NOTE — Progress Notes (Signed)
[facility]  PATIENT: [name] DOB: Sep 16, 1950  REFERRING CLINICIAN: Mincey HISTORY FROM: patient  REASON FOR VISIT: follow up   HISTORICAL  CHIEF COMPLAINT:  Chief Complaint  Patient presents with  . Ocular myasthenia    rm 7, "no change in my vision"  . Follow-up    6 month    HISTORY OF PRESENT ILLNESS:   UPDATE (01/02/18, VRP): Since last visit, doing well. Tolerating meds. No alleviating or aggravating factors. No SOB, swallow diff, speech diff. Vision stable.   UPDATE (07/05/17, VRP): Since last visit, doing well. Tolerating pyridostigmine (60mg  three times a day). No alleviating or aggravating factors. Eyes are better. Has some flare of sxs every month (2x per month; few hrs each time). No breathing, swallowing or speaking problems. CT chest reviewed with patient.   PRIOR HPI (03/22/17): 67 year old female with hypertension, hypercholesterolemia, anxiety, here for evaluation of double vision. 6 weeks ago patient had sudden onset of redness in both eyes. She then developed double vision and left eyelid drooping. She went to eye doctor, then referred to emergency room for MRI testing to rule out stroke. MRI of the brain and orbits were negative for acute process. Patient then had myasthenia gravis antibody panel testing which was notable for borderline positive anti-striation autoantibodies. Patient for here for further evaluation and management. Patient also reports a number of other symptoms including fatigue, swelling in legs, wheezing, headache, numbness, weakness, slurred speech. However these symptoms do not correlate with the recent 6 weeks of double vision and left eyelid weakness. Patient has been using an eye patch due to her double vision. Symptoms have been fairly consistent since onset. However later in her conversation patient states that symptoms fluctuate and are worse in the evening.   REVIEW OF SYSTEMS: Full 14 system review of systems  performed and negative with exception of: fatigue weakness.    ALLERGIES: No Known Allergies  HOME MEDICATIONS: Outpatient Medications Prior to Visit  Medication Sig Dispense Refill  . acetaminophen (TYLENOL) 500 MG tablet Take 1,000 mg by mouth every 6 (six) hours as needed for mild pain.     Marland Kitchen albuterol (PROVENTIL HFA;VENTOLIN HFA) 108 (90 Base) MCG/ACT inhaler Inhale 2 puffs into the lungs every 4 (four) hours as needed for wheezing or shortness of breath. 1 Inhaler 5  . allopurinol (ZYLOPRIM) 100 MG tablet TAKE 1 TABLET BY MOUTH DAILY 90 tablet 0  . aspirin EC 81 MG tablet Take 81 mg by mouth every evening.     Marland Kitchen atorvastatin (LIPITOR) 10 MG tablet TAKE 1 TABLET(10 MG) BY MOUTH DAILY (Patient taking differently: TAKE 1 TABLET(10 MG) BY MOUTH DAILY IN THE MORNING) 90 tablet 0  . diphenhydrAMINE (BENADRYL) 25 MG tablet Take 25 mg by mouth every morning.     . fluticasone (CUTIVATE) 0.05 % cream APPLY EXTERNALLY TO THE AFFECTED AREA DAILY 30 g 3  . furosemide (LASIX) 20 MG tablet TAKE 1 TABLET(20 MG) BY MOUTH DAILY FOR SWELLING 90 tablet 0  . HYDROcodone-acetaminophen (NORCO) 7.5-325 MG tablet Take 1 tablet by mouth 2 (two) times daily as needed for moderate pain. 60 tablet 0  . losartan (COZAAR) 50 MG tablet TAKE 1 TABLET BY MOUTH DAILY 90 tablet 0  . methocarbamol (ROBAXIN) 500 MG tablet Take 1 tablet (500 mg total) by mouth 2 (two) times daily as needed for muscle spasms. 20 tablet 0  . OXYGEN Inhale 1 L into the lungs at bedtime. IN ADDITION TO CPAP NIGHTLY    .  pyridostigmine (MESTINON) 60 MG tablet Take 1 tablet (60 mg total) by mouth 3 (three) times daily. 270 tablet 4  . ranitidine (ZANTAC) 150 MG tablet TAKE 1 TABLET BY MOUTH EVERY NIGHT AT BEDTIME 90 tablet 0  . traZODone (DESYREL) 50 MG tablet TAKE 1/2 TO 1 TABLET(25 TO 50 MG) BY MOUTH AT BEDTIME AS NEEDED FOR SLEEP 90 tablet 0  . megestrol (MEGACE) 40 MG tablet TAKE 1 TABLET(40 MG) BY MOUTH DAILY 90 tablet 0   No  facility-administered medications prior to visit.     PAST MEDICAL HISTORY: Past Medical History:  Diagnosis Date  . Achilles tendinitis   . Anxiety   . Asthma   . Depression   . GERD (gastroesophageal reflux disease)   . Gout   . H/O myasthenia gravis    left eye  . HTN (hypertension)   . Hyperlipidemia   . Low back pain   . Obesity hypoventilation syndrome ([place])   . Obstructive sleep apnea   . Osteoarthritis     PAST SURGICAL HISTORY: Past Surgical History:  Procedure Laterality Date  . CARDIAC CATHETERIZATION  2006  . CATARACT EXTRACTION W/PHACO Left 09/17/2015   Procedure: CATARACT EXTRACTION PHACO AND INTRAOCULAR LENS PLACEMENT LEFT EYE cde=8.58;  Surgeon: Tonny Branch, MD;  Location: [facility];  Service: Ophthalmology;  Laterality: Left;  . CATARACT EXTRACTION W/PHACO Right 05/15/2017   Procedure: CATARACT EXTRACTION PHACO AND INTRAOCULAR LENS PLACEMENT (IOC);  Surgeon: Tonny Branch, MD;  Location: [facility];  Service: Ophthalmology;  Laterality: Right;  CDE: 6.34  . COLONOSCOPY N/A 12/25/2013   Procedure: COLONOSCOPY;  Surgeon: Rogene Houston, MD;  Location: [facility];  Service: Endoscopy;  Laterality: N/A;  730-rescheduled Ann notified pt  . CYST EXCISION N/A 09/12/2016   Procedure: EXCISION SEBACEOUS CYST, BACK;  Surgeon: Aviva Signs, MD;  Location: [facility];  Service: General;  Laterality: N/A;  . INTRAOCULAR LENS INSERTION Bilateral 2018   Dr. Tonny Branch   . TUBAL LIGATION      FAMILY HISTORY: Family History  Problem Relation Age of Onset  . Heart disease Mother   . Thyroid disease Mother   . Heart failure Father   . Lung disease Father   . Diabetes Brother   . Crohn's disease Daughter     SOCIAL HISTORY:  Social History   Socioeconomic History  . Marital status: Widowed    Spouse name: Not on file  . Number of children: 3  . Years of education: 63  . Highest education level: Not on file  Occupational History  . Occupation: disabled    Fish farm manager:  UNEMPLOYED  Social Needs  . Financial resource strain: Not on file  . Food insecurity:    Worry: Not on file    Inability: Not on file  . Transportation needs:    Medical: Not on file    Non-medical: Not on file  Tobacco Use  . Smoking status: Former Smoker    Packs/day: 2.00    Years: 36.00    Pack years: 72.00    Types: Cigarettes    Start date: 06/25/1969    Last attempt to quit: 03/25/2005    Years since quitting: 12.7  . Smokeless tobacco: Never Used  Substance and Sexual Activity  . Alcohol use: No    Alcohol/week: 0.0 oz  . Drug use: No  . Sexual activity: Never    Birth control/protection: Post-menopausal  Lifestyle  . Physical activity:    Days per week: Not on file  Minutes per session: Not on file  . Stress: Not on file  Relationships  . Social connections:    Talks on phone: Not on file    Gets together: Not on file    Attends religious service: Not on file    Active member of club or organization: Not on file    Attends meetings of clubs or organizations: Not on file    Relationship status: Not on file  . Intimate partner violence:    Fear of current or ex partner: Not on file    Emotionally abused: Not on file    Physically abused: Not on file    Forced sexual activity: Not on file  Other Topics Concern  . Not on file  Social History Narrative   Originally from [place]. She has always lived in [place]. 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 No pets currently. No bird, mold, or hot tub exposure.    Lives alone   No caffeine     PHYSICAL EXAM  GENERAL EXAM/CONSTITUTIONAL: Vitals:  Vitals:   01/02/18 1021  BP: 131/70  Pulse: 87  Weight: 282 lb 3.2 oz (128 kg)  Height: 5' 4.5" (1.638 m)   Body mass index is 47.69 kg/m. No exam data present  Patient is in no distress; well developed, nourished and groomed; neck is supple  POOR DENTITION  CARDIOVASCULAR:  Examination of carotid arteries is normal; no carotid  bruits  Regular rate and rhythm, no murmurs  Examination of peripheral vascular system by observation and palpation is normal  EYES:  Ophthalmoscopic exam of optic discs and posterior segments is normal; no papilledema or hemorrhages  MUSCULOSKELETAL:  Gait, strength, tone, movements noted in Neurologic exam below  NEUROLOGIC: MENTAL STATUS:  No flowsheet data found.  awake, alert, oriented to person, place and time  recent and remote memory intact  normal attention and concentration  language fluent, comprehension intact, naming intact,   fund of knowledge appropriate  CRANIAL NERVE:   2nd - no papilledema on fundoscopic exam  2nd, 3rd, 4th, 6th - pupils equal and reactive to light, visual fields full to confrontation, extraocular muscles intact, no nystagmus; LEFT PTOSIS; NO DIPLOPIA  5th - facial sensation symmetric  7th - facial strength symmetric  8th - hearing intact  9th - palate elevates symmetrically, uvula midline  11th - shoulder shrug symmetric  12th - tongue protrusion midline  MOTOR:   normal bulk and tone, full strength in the BUE, BLE  SENSORY:   normal and symmetric to light touch  COORDINATION:   finger-nose-finger, fine finger movements normal  REFLEXES:   deep tendon reflexes present and symmetric  GAIT/STATION:   narrow based gait    DIAGNOSTIC DATA (LABS, IMAGING, TESTING) - I reviewed patient records, labs, notes, testing and imaging myself where available.  Lab Results  Component Value Date   WBC 9.1 12/06/2017   HGB 12.4 12/06/2017   HCT 37.4 12/06/2017   MCV 89.9 12/06/2017   PLT 283 12/06/2017      Component Value Date/Time   NA 142 12/06/2017 1117   K 4.9 12/06/2017 1117   CL 105 12/06/2017 1117   CO2 31 12/06/2017 1117   GLUCOSE 81 12/06/2017 1117   BUN 14 12/06/2017 1117   CREATININE 0.91 12/06/2017 1117   CALCIUM 9.3 12/06/2017 1117   PROT 7.3 12/06/2017 1117   ALBUMIN 3.5 02/24/2017 0938   AST  14 12/06/2017 1117   ALT 12 12/06/2017 1117  ALKPHOS 71 02/24/2017 0938   BILITOT 0.6 12/06/2017 1117   GFRNONAA 46 (L) 02/24/2017 0938   GFRAA 54 (L) 02/24/2017 0938   Lab Results  Component Value Date   CHOL 179 12/06/2017   HDL 41 (L) 12/06/2017   LDLCALC 112 (H) 12/06/2017   LDLDIRECT 157 (H) 02/25/2013   TRIG 145 12/06/2017   CHOLHDL 4.4 12/06/2017   Lab Results  Component Value Date   HGBA1C 5.6 12/06/2017   Lab Results  Component Value Date   VITAMINB12 450 01/25/2012   Lab Results  Component Value Date   TSH 1.766 01/25/2012    02/24/17 MRI brain / orbits [I reviewed images myself and agree with interpretation. -VRP]  - No explanation for symptoms. No infarct, compressive lesion, or neuritis findings.  03/01/17 labs - anti AchR binding, blocking, modulation - normal - anti striational ab - 1:40 (h)  04/05/17 CT chest - No evidence of residual thymic tissue or or thymoma. - No acute abnormality noted.    ASSESSMENT AND PLAN  67 y.o. year old female here with new onset of double vision left eyelid ptosis, with positive ice pack test in office, and slightly positive anti-striation all antibody testing. Findings consistent with ocular myasthenia gravis. Doing better on pyridostigmine.    Dx:  1. Ocular myasthenia (HCC)      PLAN:  I spent 15 minutes of face to face time with patient. Greater than 50% of time was spent in counseling and coordination of care with patient. In summary we discussed:   - continue pyridostigmine 60mg  three times per day - may consider additional immunosuppressant therapy (cellcept, imuran, prednisone) in future - cautioned patient if she develops breathing, chewing, swallowing sxs; patient to contact us right away or go to ER if bulbar or generalized symptoms develop  Meds ordered this encounter  Medications  . pyridostigmine (MESTINON) 60 MG tablet    Sig: Take 1 tablet (60 mg total) by mouth 3 (three) times daily.     Dispense:  270 tablet    Refill:  4   Return in about 1 year (around 01/03/2019). or sooner if needed    Penni Bombard, MD 02/10/9469, 96:28 AM Certified in Neurology, Neurophysiology and Neuroimaging  [facility] [address], [place], [place] [address] [phone]

## 2018-01-03 ENCOUNTER — Ambulatory Visit: Payer: Medicare Other | Admitting: Diagnostic Neuroimaging

## 2018-01-04 ENCOUNTER — Encounter: Payer: Self-pay | Admitting: Family Medicine

## 2018-01-04 ENCOUNTER — Other Ambulatory Visit: Payer: Self-pay

## 2018-01-04 ENCOUNTER — Ambulatory Visit (INDEPENDENT_AMBULATORY_CARE_PROVIDER_SITE_OTHER): Payer: Medicare Other | Admitting: Family Medicine

## 2018-01-04 VITALS — BP 130/64 | HR 84 | Temp 98.1°F | Resp 14 | Ht 64.5 in | Wt 280.0 lb

## 2018-01-04 DIAGNOSIS — M545 Low back pain, unspecified: Secondary | ICD-10-CM

## 2018-01-04 DIAGNOSIS — R609 Edema, unspecified: Secondary | ICD-10-CM | POA: Diagnosis not present

## 2018-01-04 MED ORDER — METHOCARBAMOL 500 MG PO TABS: 500.0000 mg | ORAL_TABLET | Freq: Three times a day (TID) | ORAL | 0 refills | Status: DC | PRN

## 2018-01-04 NOTE — Patient Instructions (Addendum)
1. Acute right-sided low back pain without sciatica Refill robaxin Ambulatory referral to Physical Therapy F/up in 2-4 weeks  2. Dependent edema Continue lasix as needed for 3d only and call office when using Use compression stockings Continue to elevate legs  Please follow up immediately if you feel new or worsening shortness of breath, chest pain, weakness   Chronic Back Pain When back pain lasts longer than 3 months, it is called chronic back pain.The cause of your back pain may not be known. Some common causes include:  Wear and tear (degenerative disease) of the bones, ligaments, or disks in your back.  Inflammation and stiffness in your back (arthritis).  People who have chronic back pain often go through certain periods in which the pain is more intense (flare-ups). Many people can learn to manage the pain with home care. Follow these instructions at home: Pay attention to any changes in your symptoms. Take these actions to help with your pain: Activity  Avoid bending and activities that make the problem worse.  Do not sit or stand in one place for long periods of time.  Take brief periods of rest throughout the day. This will reduce your pain. Resting in a lying or standing position is usually better than sitting to rest.  When you are resting for longer periods, mix in some mild activity or stretching between periods of rest. This will help to prevent stiffness and pain.  Get regular exercise. Ask your health care provider what activities are safe for you.  Do not lift anything that is heavier than 10 lb (4.5 kg). Always use proper lifting technique, which includes: ? Bending your knees. ? Keeping the load close to your body. ? Avoiding twisting. Managing pain  If directed, apply ice to the painful area. Your health care provider may recommend applying ice during the first 24-48 hours after a flare-up begins. ? Put ice in a plastic bag. ? Place a towel between  your skin and the bag. ? Leave the ice on for 20 minutes, 2-3 times per day.  After icing, apply heat to the affected area as often as told by your health care provider. Use the heat source that your health care provider recommends, such as a moist heat pack or a heating pad. ? Place a towel between your skin and the heat source. ? Leave the heat on for 20-30 minutes. ? Remove the heat if your skin turns bright red. This is especially important if you are unable to feel pain, heat, or cold. You may have a greater risk of getting burned.  Try soaking in a warm tub.  Take over-the-counter and prescription medicines only as told by your health care provider.  Keep all follow-up visits as told by your health care provider. This is important. Contact a health care provider if:  You have pain that is not relieved with rest or medicine. Get help right away if:  You have weakness or numbness in one or both of your legs or feet.  You have trouble controlling your bladder or your bowels.  You have nausea or vomiting.  You have pain in your abdomen.  You have shortness of breath or you faint. This information is not intended to replace advice given to you by your health care provider. Make sure you discuss any questions you have with your health care provider. Document Released: 08/18/2004 Document Revised: 11/19/2015 Document Reviewed: 12/29/2014 Elsevier Interactive Patient Education  2018 [employer].  Edema Edema is  when you have too much fluid in your body or under your skin. Edema may make your legs, feet, and ankles swell up. Swelling is also common in looser tissues, like around your eyes. This is a common condition. It gets more common as you get older. There are many possible causes of edema. Eating too much salt (sodium) and being on your feet or sitting for a long time can cause edema in your legs, feet, and ankles. Hot weather may make edema worse. Edema is usually painless. Your  skin may look swollen or shiny. Follow these instructions at home:  Keep the swollen body part raised (elevated) above the level of your heart when you are sitting or lying down.  Do not sit still or stand for a long time.  Do not wear tight clothes. Do not wear garters on your upper legs.  Exercise your legs. This can help the swelling go down.  Wear elastic bandages or support stockings as told by your doctor.  Eat a low-salt (low-sodium) diet to reduce fluid as told by your doctor.  Depending on the cause of your swelling, you may need to limit how much fluid you drink (fluid restriction).  Take over-the-counter and prescription medicines only as told by your doctor. Contact a doctor if:  Treatment is not working.  You have heart, liver, or kidney disease and have symptoms of edema.  You have sudden and unexplained weight gain. Get help right away if:  You have shortness of breath or chest pain.  You cannot breathe when you lie down.  You have pain, redness, or warmth in the swollen areas.  You have heart, liver, or kidney disease and get edema all of a sudden.  You have a fever and your symptoms get worse all of a sudden. Summary  Edema is when you have too much fluid in your body or under your skin.  Edema may make your legs, feet, and ankles swell up. Swelling is also common in looser tissues, like around your eyes.  Raise (elevate) the swollen body part above the level of your heart when you are sitting or lying down.  Follow your doctor's instructions about diet and how much fluid you can drink (fluid restriction). This information is not intended to replace advice given to you by your health care provider. Make sure you discuss any questions you have with your health care provider. Document Released: 12/28/2007 Document Revised: 07/29/2016 Document Reviewed: 07/29/2016 Elsevier Interactive Patient Education  2017 [employer].

## 2018-01-04 NOTE — Progress Notes (Signed)
Patient ID: [name], female    DOB: 01-Aug-1950, 67 y.o.   MRN: [id number]  PCP: Alycia Rossetti, MD  Chief Complaint  Patient presents with  . ER F/U    [facility]- muscle spasms in back  . Follow-up    BLE edema    Subjective:   [name] is a 67 y.o. female, presents to clinic with CC of continued back spasms, located to b/l low back, but worse on the right.  Pain is constant and exacerbated when sitting in a car and trying to get out or in.  Pain is described as sharp with spasms, rated 10/10.  The muscle relaxer pills that the ER gave her help, and she is out of them.  No other alleviating or aggravating factors.  She is following up from the ER visit.  She denies LE weakness, pain, numbness, tingling.  No abdominal pain, urinary sx.  No fever, chills, sweats, N, V.  She is here also to follow up on dependent edema, her leg swelling is much improved.  Patient Active Problem List   Diagnosis Date Noted  . Asthma 05/14/2015  . Heel spur 02/24/2015  . Depression 02/24/2015  . Post-menopausal bleeding 06/02/2014  . OA (osteoarthritis) of knee 04/09/2014  . Epidermal cyst 10/01/2013  . Morbid obesity ([place]) 02/25/2013  . Back pain 03/04/2012  . Gout 01/25/2012  . Glucose intolerance (impaired glucose tolerance) 04/01/2011  . Edema 01/28/2011  . OBESITY HYPOVENTILATION SYNDROME 06/16/2008  . OBSTRUCTIVE SLEEP APNEA 05/08/2008  . Hyperlipidemia 08/28/2006  . Essential hypertension 08/28/2006  . GERD 08/28/2006  . Osteoarthritis 08/28/2006     Prior to Admission medications   Medication Sig Start Date End Date Taking? Authorizing Provider  acetaminophen (TYLENOL) 500 MG tablet Take 1,000 mg by mouth every 6 (six) hours as needed for mild pain.    Yes [provider]  albuterol (PROVENTIL HFA;VENTOLIN HFA) 108 (90 Base) MCG/ACT inhaler Inhale 2 puffs into the lungs every 4 (four) hours as needed for wheezing or shortness of breath. 12/06/17  Yes [place],  Modena Nunnery, MD  allopurinol (ZYLOPRIM) 100 MG tablet TAKE 1 TABLET BY MOUTH DAILY 12/19/17  Yes [place], Modena Nunnery, MD  aspirin EC 81 MG tablet Take 81 mg by mouth every evening.    Yes [provider]  atorvastatin (LIPITOR) 10 MG tablet TAKE 1 TABLET(10 MG) BY MOUTH DAILY Patient taking differently: TAKE 1 TABLET(10 MG) BY MOUTH DAILY IN THE MORNING 10/10/17  Yes [place], Modena Nunnery, MD  diphenhydrAMINE (BENADRYL) 25 MG tablet Take 25 mg by mouth every morning.    Yes [provider]  fluticasone (CUTIVATE) 0.05 % cream APPLY EXTERNALLY TO THE AFFECTED AREA DAILY 08/07/17  Yes [place], Modena Nunnery, MD  furosemide (LASIX) 20 MG tablet TAKE 1 TABLET(20 MG) BY MOUTH DAILY FOR SWELLING 10/10/17  Yes [place], Modena Nunnery, MD  HYDROcodone-acetaminophen (NORCO) 7.5-325 MG tablet Take 1 tablet by mouth 2 (two) times daily as needed for moderate pain. 12/06/17  Yes [place], Modena Nunnery, MD  losartan (COZAAR) 50 MG tablet TAKE 1 TABLET BY MOUTH DAILY 12/12/17  Yes [place], Modena Nunnery, MD  methocarbamol (ROBAXIN) 500 MG tablet Take 1 tablet (500 mg total) by mouth 2 (two) times daily as needed for muscle spasms. 12/26/17  Yes [place], [name], MD  OXYGEN Inhale 1 L into the lungs at bedtime. IN ADDITION TO CPAP NIGHTLY   Yes [provider]  pyridostigmine (MESTINON) 60 MG tablet Take 1  tablet (60 mg total) by mouth 3 (three) times daily. 01/02/18  Yes [place], Earlean Polka, MD  ranitidine (ZANTAC) 150 MG tablet TAKE 1 TABLET BY MOUTH EVERY NIGHT AT BEDTIME 10/16/17  Yes [place], Modena Nunnery, MD  traZODone (DESYREL) 50 MG tablet TAKE 1/2 TO 1 TABLET(25 TO 50 MG) BY MOUTH AT BEDTIME AS NEEDED FOR SLEEP 11/16/17  Yes [place], Modena Nunnery, MD     No Known Allergies   Family History  Problem Relation Age of Onset  . Heart disease Mother   . Thyroid disease Mother   . Heart failure Father   . Lung disease Father   . Diabetes Brother   . Crohn's disease Daughter      Social History   Socioeconomic History    . Marital status: Widowed    Spouse name: Not on file  . Number of children: 3  . Years of education: 79  . Highest education level: Not on file  Occupational History  . Occupation: disabled    Fish farm manager: UNEMPLOYED  Social Needs  . Financial resource strain: Not on file  . Food insecurity:    Worry: Not on file    Inability: Not on file  . Transportation needs:    Medical: Not on file    Non-medical: Not on file  Tobacco Use  . Smoking status: Former Smoker    Packs/day: 2.00    Years: 36.00    Pack years: 72.00    Types: Cigarettes    Start date: 06/25/1969    Last attempt to quit: 03/25/2005    Years since quitting: 12.7  . Smokeless tobacco: Never Used  Substance and Sexual Activity  . Alcohol use: No    Alcohol/week: 0.0 oz  . Drug use: No  . Sexual activity: Never    Birth control/protection: Post-menopausal  Lifestyle  . Physical activity:    Days per week: Not on file    Minutes per session: Not on file  . Stress: Not on file  Relationships  . Social connections:    Talks on phone: Not on file    Gets together: Not on file    Attends religious service: Not on file    Active member of club or organization: Not on file    Attends meetings of clubs or organizations: Not on file    Relationship status: Not on file  . Intimate partner violence:    Fear of current or ex partner: Not on file    Emotionally abused: Not on file    Physically abused: Not on file    Forced sexual activity: Not on file  Other Topics Concern  . Not on file  Social History Narrative   Originally from [place]. She has always lived in [place]. 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 No pets currently. No bird, mold, or hot tub exposure.    Lives alone   No caffeine     Review of Systems  Constitutional: Negative.  Negative for activity change, appetite change, chills, diaphoresis, fatigue and fever.  HENT: Negative.   Eyes: Negative.   Respiratory:  Negative.  Negative for cough, chest tightness and shortness of breath.   Cardiovascular: Negative for chest pain, palpitations and leg swelling.  Gastrointestinal: Negative.   Endocrine: Negative.   Genitourinary: Negative.  Negative for decreased urine volume, difficulty urinating, dysuria, flank pain, frequency, hematuria and urgency.  Musculoskeletal: Positive for back pain. Negative for arthralgias, gait problem, joint swelling, myalgias and  neck pain.  Skin: Negative.   Allergic/Immunologic: Negative.   Neurological: Negative.   Psychiatric/Behavioral: Negative.        Objective:    Vitals:   01/04/18 1010  BP: 130/64  Pulse: 84  Resp: 14  Temp: 98.1 F (36.7 C)  TempSrc: Oral  SpO2: 99%  Weight: 280 lb (127 kg)  Height: 5' 4.5" (1.638 m)      Physical Exam  Constitutional: She is oriented to person, place, and time. She appears well-developed and well-nourished.  Non-toxic appearance. No distress.  Chronically ill appearing elderly female, appears older than statted age, obese, non-toxic, NAD  HENT:  Head: Normocephalic and atraumatic.  Right Ear: External ear normal.  Left Ear: External ear normal.  Nose: Nose normal.  Mouth/Throat: Uvula is midline, oropharynx is clear and moist and mucous membranes are normal.  Eyes: Pupils are equal, round, and reactive to light. Conjunctivae and lids are normal. Right eye exhibits no discharge. Left eye exhibits no discharge.  Neck: Normal range of motion and phonation normal. Neck supple. No JVD present. No tracheal deviation present.  Cardiovascular: Normal rate, regular rhythm, normal heart sounds and normal pulses. Exam reveals no gallop and no friction rub.  No murmur heard. Pulses:      Radial pulses are 2+ on the right side, and 2+ on the left side.  Mild b/l non-pitting LE edema  Pulmonary/Chest: Effort normal and breath sounds normal. No stridor. No respiratory distress. She has no wheezes. She has no rhonchi. She  has no rales. She exhibits no tenderness.  Abdominal: Soft. Normal appearance and bowel sounds are normal. She exhibits no distension. There is no tenderness. There is no rebound and no guarding.  Musculoskeletal: She exhibits no edema or deformity.       Lumbar back: She exhibits decreased range of motion and tenderness. She exhibits no bony tenderness, no swelling, no edema and no spasm.       Back:  Lymphadenopathy:    She has no cervical adenopathy.  Neurological: She is alert and oriented to person, place, and time. She has normal strength. No sensory deficit. She exhibits normal muscle tone. Coordination and gait normal.  5/5 dorsiflexion and plantarflexion  Skin: Skin is warm, dry and intact. Capillary refill takes less than 2 seconds. No rash noted. She is not diaphoretic. No pallor.  Psychiatric: She has a normal mood and affect. Her speech is normal and behavior is normal.  Nursing note and vitals reviewed.         Assessment & Plan:      ICD-10-CM   1. Acute right-sided low back pain without sciatica M54.5 Ambulatory referral to Physical Therapy   constant since 03/2017, muscle relaxers from ER helped, will refill robaxin, send to PT, may need MRI, but currently no radicular sx  2. Dependent edema R60.9    improved but not resolved with 3 days of lasix, will add compression stockings       Delsa Grana, PA-C 01/04/18 10:42 AM

## 2018-01-05 ENCOUNTER — Other Ambulatory Visit: Payer: Self-pay

## 2018-01-05 ENCOUNTER — Other Ambulatory Visit: Payer: Self-pay | Admitting: Family Medicine

## 2018-01-05 MED ORDER — HYDROCODONE-ACETAMINOPHEN 7.5-325 MG PO TABS: 1.0000 | ORAL_TABLET | Freq: Two times a day (BID) | ORAL | 0 refills | Status: DC | PRN

## 2018-01-05 MED ORDER — METHOCARBAMOL 500 MG PO TABS: 500.0000 mg | ORAL_TABLET | Freq: Three times a day (TID) | ORAL | 0 refills | Status: DC | PRN

## 2018-01-05 NOTE — Progress Notes (Signed)
Patient called in stating that the pharmacy did not receive prescription for Robaxin. Prescription was selected to print on yesterday. Prescription resent to the pharmacy today

## 2018-01-05 NOTE — Telephone Encounter (Signed)
Ok to refill??  Last office visit 01/04/2018.  Last refill 12/06/2017.

## 2018-01-05 NOTE — Telephone Encounter (Signed)
Refill on hydrocodone to [facility] [address]

## 2018-01-12 ENCOUNTER — Other Ambulatory Visit: Payer: Self-pay | Admitting: Family Medicine

## 2018-01-16 DIAGNOSIS — G4733 Obstructive sleep apnea (adult) (pediatric): Secondary | ICD-10-CM | POA: Diagnosis not present

## 2018-01-17 ENCOUNTER — Encounter (HOSPITAL_COMMUNITY): Payer: Self-pay

## 2018-01-17 ENCOUNTER — Other Ambulatory Visit: Payer: Self-pay

## 2018-01-17 ENCOUNTER — Ambulatory Visit (HOSPITAL_COMMUNITY): Payer: Medicare Other | Attending: Family Medicine

## 2018-01-17 DIAGNOSIS — M6281 Muscle weakness (generalized): Secondary | ICD-10-CM

## 2018-01-17 DIAGNOSIS — R29898 Other symptoms and signs involving the musculoskeletal system: Secondary | ICD-10-CM | POA: Insufficient documentation

## 2018-01-17 DIAGNOSIS — M62838 Other muscle spasm: Secondary | ICD-10-CM | POA: Diagnosis not present

## 2018-01-17 NOTE — Patient Instructions (Signed)
Access Code: [email or web address]  URL: [email or web address]  Date: 01/17/2018  Prepared by: Geraldine Solar   Exercises Supine Transversus Abdominis Bracing - Hands on Ground - 10 reps - 3 sets - 1x daily - 7x weekly

## 2018-01-17 NOTE — Therapy (Signed)
[place], [place], [address] Phone: [phone]   Fax:  [phone]  Physical Therapy Evaluation  Patient Details  Name: [name] MRN: [id number] Date of Birth: 1951/01/25 Referring Provider: Delsa Grana, PA-C (at [facility] office)   Encounter Date: 01/17/2018  PT End of Session - 01/17/18 1158    Visit Number  1    Number of Visits  9    Date for PT Re-Evaluation  02/14/18    Authorization Type  [employer] Medicare    Authorization Time Period  01/17/18 to 02/14/18    Authorization - Visit Number  1    Authorization - Number of Visits  10    PT Start Time  1110    PT Stop Time  1150    PT Time Calculation (min)  40 min    Activity Tolerance  Patient tolerated treatment well;No increased pain    Behavior During Therapy  WFL for tasks assessed/performed       Past Medical History:  Diagnosis Date  . Achilles tendinitis   . Anxiety   . Asthma   . Depression   . GERD (gastroesophageal reflux disease)   . Gout   . H/O myasthenia gravis    left eye  . HTN (hypertension)   . Hyperlipidemia   . Low back pain   . Obesity hypoventilation syndrome ([place])   . Obstructive sleep apnea   . Osteoarthritis     Past Surgical History:  Procedure Laterality Date  . CARDIAC CATHETERIZATION  2006  . CATARACT EXTRACTION W/PHACO Left 09/17/2015   Procedure: CATARACT EXTRACTION PHACO AND INTRAOCULAR LENS PLACEMENT LEFT EYE cde=8.58;  Surgeon: Tonny Branch, MD;  Location: [facility];  Service: Ophthalmology;  Laterality: Left;  . CATARACT EXTRACTION W/PHACO Right 05/15/2017   Procedure: CATARACT EXTRACTION PHACO AND INTRAOCULAR LENS PLACEMENT (IOC);  Surgeon: Tonny Branch, MD;  Location: [facility];  Service: Ophthalmology;  Laterality: Right;  CDE: 6.34  . COLONOSCOPY N/A 12/25/2013   Procedure: COLONOSCOPY;  Surgeon: Rogene Houston, MD;  Location: [facility] SUITE;  Service: Endoscopy;  Laterality: N/A;  730-rescheduled Ann  notified pt  . CYST EXCISION N/A 09/12/2016   Procedure: EXCISION SEBACEOUS CYST, BACK;  Surgeon: Aviva Signs, MD;  Location: [facility];  Service: General;  Laterality: N/A;  . INTRAOCULAR LENS INSERTION Bilateral 2018   Dr. Tonny Branch   . TUBAL LIGATION      There were no vitals filed for this visit.   Subjective Assessment - 01/17/18 1114    Subjective  Pt reports that her R side/anterior flank hurts and it has been hurting for about 2-3 months. It came on with insidious onset. She reports 1 occurrence of a shooting pain down her leg but states that most of her pain radiates up her side. She reports her pain as feeling numb, sometimes sharp. She states that she had imaging done that didn't show anything wrong with her kidneys. She reports that twisting to the left and lifting her L leg will recreate/aggravate her pain. She reports medicine makes it feel better. She denies any b/b changes. She reports she does have intermittent LBP but feels it is a different kind of pain from her side/anterior flank pain.    How long can you sit comfortably?  no issues    How long can you stand comfortably?  no issues    How long can you walk comfortably?  hasn't noticed side/flank pain when walking  Patient Stated Goals  ease this pain    Currently in Pain?  No/denies         [facility] PT Assessment - 01/17/18 0001      Assessment   Medical Diagnosis  Acute right-sided low back pain without sciatica    Referring Provider  Delsa Grana, PA-C at [facility] office    Onset Date/Surgical Date  -- 2-3 MA    Next MD Visit  September 2019 or earlier if needed    Prior Therapy  none      Balance Screen   Has the patient fallen in the past 6 months  No    Has the patient had a decrease in activity level because of a fear of falling?   No    Is the patient reluctant to leave their home because of a fear of falling?   No      Prior Function   Level of Independence  Independent    Vocation  Retired     Leisure  watch TV, reading      Observation/Other Assessments   Focus on Therapeutic Outcomes (FOTO)   52% limitation      Posture/Postural Control   Posture/Postural Control  Postural limitations    Postural Limitations  Rounded Shoulders;Forward head;Increased thoracic kyphosis;Increased lumbar lordosis      ROM / Strength   AROM / PROM / Strength  AROM;Strength      AROM   AROM Assessment Site  Lumbar    Lumbar Flexion  50% limited, in lordosis    Lumbar Extension  in lordosis, extension range still limited    Lumbar - Right Side Bend  just above knee, recreated R flank pain    Lumbar - Left Side Bend  just above knee, caused stretch on R side    Lumbar - Right Rotation  25% limtied, no pain    Lumbar - Left Rotation  25% limited, cuased R flank pain      Strength   Strength Assessment Site  Hip;Knee;Ankle    Right Hip Flexion  4+/5    Right Hip Extension  2+/5    Right Hip ABduction  3-/5    Left Hip Flexion  4/5    Left Hip Extension  3-/5    Left Hip ABduction  3/5    Right Knee Flexion  4/5    Right Knee Extension  4+/5    Left Knee Flexion  4/5    Left Knee Extension  4+/5    Right Ankle Dorsiflexion  5/5    Left Ankle Dorsiflexion  5/5      Palpation   Palpation comment  increased soft tissue restrictions of R obliques compared to L and reported recreation of same pain      Balance   Balance Assessed  No      Static Standing Balance   Static Standing - Balance Support  --    Static Standing Balance -  Activities   --    Static Standing - Comment/# of Minutes  --          Objective measurements completed on examination: See above findings.        PT Education - 01/17/18 1157    Education Details  exam findings, POC, HEP    Person(s) Educated  Patient    Methods  Explanation;Demonstration;Handout    Comprehension  Verbalized understanding;Returned demonstration       PT Short Term Goals - 01/17/18 1208  PT SHORT TERM GOAL #1   Title  Pt  will be independent with HEP in order to decrease pain and improve function.    Time  2    Period  Weeks    Status  New    Target Date  01/31/18      PT SHORT TERM GOAL #2   Title  Pt will have 1/2 grade improvement in MMT in order to decrease stress/strain on back/side and maximize her functional mobility with less pain.    Time  2    Period  Weeks    Status  New        PT Long Term Goals - 01/17/18 1210      PT LONG TERM GOAL #1   Title  Pt will report being able to transfer in/out of her car with 2/10 R side pain or < in order to demo improved function and return to PLOF.    Time  4    Period  Weeks    Status  New    Target Date  02/14/18      PT LONG TERM GOAL #2   Title  Pt will be mod I with bed mobility and report 2/10 R side pain or < in order to maximize her function at home and promote improved sleep.    Time  4    Status  New      PT LONG TERM GOAL #3   Title  Pt will have WFL lumbar ROM and no pain on R side with L rotation or R lateral flexion in order to demo improved R external oblique soft tissue length and flexibility in order to maximize her functional mobility and decrease pain.    Time  4    Period  Weeks    Status  New             Plan - 01/17/18 1158    Clinical Impression Statement  Pt is pleasant 67YO F who presents to OPPT with c/o R side truncal/anterior flank pain. She reports it started insidiously 2-3 months ago and denies any h/o kidney problems. Pt currently presents with deficits in functional mobility, bed mobility, strength, core strength, and lumbar ROM. She reported that L trunk rotation, L hip flexion, and R trunk lateral flexion all recreated her same pain on the R side. Pt presenting with s/s consistent with R external oblique muscle strain/spasm as its actions are to contralaterally rotate and ipsilaterally side bend the trunk. Pt also reported that palpation to this muscle recreated her same pain. Pt needs skilled PT intervention to  address these impairments in order to decrease her pain and maximize her overall functional mobility.    Clinical Presentation  Stable    Clinical Presentation due to:  lumbar ROM, palpation, core and functional strength, functional mobility, bed mobility    Clinical Decision Making  Low    Rehab Potential  Good    PT Frequency  2x / week    PT Duration  4 weeks    PT Treatment/Interventions  ADLs/Self Care Home Management;Aquatic Therapy;Cryotherapy;Electrical Stimulation;Moist Heat;Ultrasound;DME Instruction;Gait training;Stair training;Functional mobility training;Therapeutic activities;Therapeutic exercise;Balance training;Neuromuscular re-education;Patient/family education;Manual techniques;Passive range of motion;Dry needling;Energy conservation;Taping    PT Next Visit Plan  review goals and HEP; begin stretching for R obliques, core, functional, and BLE strengthening, thoracic/lumbar mobility, as well as manual STM to decrease soft tissue restrictions and pain in R obliques    PT Home Exercise Plan  eval: supine ab  set    Consulted and Agree with Plan of Care  Patient       Patient will benefit from skilled therapeutic intervention in order to improve the following deficits and impairments:  Decreased activity tolerance, Decreased mobility, Decreased range of motion, Decreased strength, Increased muscle spasms, Impaired flexibility, Improper body mechanics, Postural dysfunction, Pain, Obesity  Visit Diagnosis: Other muscle spasm - Plan: PT plan of care cert/re-cert  Muscle weakness (generalized) - Plan: PT plan of care cert/re-cert  Other symptoms and signs involving the musculoskeletal system - Plan: PT plan of care cert/re-cert     Problem List Patient Active Problem List   Diagnosis Date Noted  . Asthma 05/14/2015  . Heel spur 02/24/2015  . Depression 02/24/2015  . Post-menopausal bleeding 06/02/2014  . OA (osteoarthritis) of knee 04/09/2014  . Epidermal cyst 10/01/2013   . Morbid obesity ([place]) 02/25/2013  . Back pain 03/04/2012  . Gout 01/25/2012  . Glucose intolerance (impaired glucose tolerance) 04/01/2011  . Edema 01/28/2011  . OBESITY HYPOVENTILATION SYNDROME 06/16/2008  . OBSTRUCTIVE SLEEP APNEA 05/08/2008  . Hyperlipidemia 08/28/2006  . Essential hypertension 08/28/2006  . GERD 08/28/2006  . Osteoarthritis 08/28/2006       Geraldine Solar PT, DPT  [facility] [address] [place], [place], [address] Phone: [phone]   Fax:  [phone]  Name: [name] MRN: [id number] Date of Birth: 1951-07-01

## 2018-01-18 ENCOUNTER — Ambulatory Visit (HOSPITAL_COMMUNITY): Payer: Medicare Other

## 2018-01-18 DIAGNOSIS — M6281 Muscle weakness (generalized): Secondary | ICD-10-CM

## 2018-01-18 DIAGNOSIS — R29898 Other symptoms and signs involving the musculoskeletal system: Secondary | ICD-10-CM

## 2018-01-18 DIAGNOSIS — M62838 Other muscle spasm: Secondary | ICD-10-CM

## 2018-01-18 NOTE — Therapy (Signed)
[place], [place], [address] Phone: [phone]   Fax:  [phone]  Physical Therapy Treatment  Patient Details  Name: [name] MRN: [id number] Date of Birth: 1950-12-14 Referring Provider: Delsa Grana, PA-C (at [facility] office)   Encounter Date: 01/18/2018  PT End of Session - 01/18/18 0855    Visit Number  2    Number of Visits  9    Date for PT Re-Evaluation  02/14/18    Authorization Type  [employer] Medicare    Authorization Time Period  01/17/18 to 02/14/18    Authorization - Visit Number  2    Authorization - Number of Visits  10    PT Start Time  0900    PT Stop Time  0941    PT Time Calculation (min)  41 min    Activity Tolerance  Patient tolerated treatment well;No increased pain    Behavior During Therapy  WFL for tasks assessed/performed       Past Medical History:  Diagnosis Date  . Achilles tendinitis   . Anxiety   . Asthma   . Depression   . GERD (gastroesophageal reflux disease)   . Gout   . H/O myasthenia gravis    left eye  . HTN (hypertension)   . Hyperlipidemia   . Low back pain   . Obesity hypoventilation syndrome ([place])   . Obstructive sleep apnea   . Osteoarthritis     Past Surgical History:  Procedure Laterality Date  . CARDIAC CATHETERIZATION  2006  . CATARACT EXTRACTION W/PHACO Left 09/17/2015   Procedure: CATARACT EXTRACTION PHACO AND INTRAOCULAR LENS PLACEMENT LEFT EYE cde=8.58;  Surgeon: Tonny Branch, MD;  Location: [facility];  Service: Ophthalmology;  Laterality: Left;  . CATARACT EXTRACTION W/PHACO Right 05/15/2017   Procedure: CATARACT EXTRACTION PHACO AND INTRAOCULAR LENS PLACEMENT (IOC);  Surgeon: Tonny Branch, MD;  Location: [facility];  Service: Ophthalmology;  Laterality: Right;  CDE: 6.34  . COLONOSCOPY N/A 12/25/2013   Procedure: COLONOSCOPY;  Surgeon: Rogene Houston, MD;  [facility];  Service: Endoscopy;  Laterality: N/A;  730-rescheduled Ann  notified pt  . CYST EXCISION N/A 09/12/2016   Procedure: EXCISION SEBACEOUS CYST, BACK;  Surgeon: Aviva Signs, MD;  Location: [facility];  Service: General;  Laterality: N/A;  . INTRAOCULAR LENS INSERTION Bilateral 2018   Dr. Tonny Branch   . TUBAL LIGATION      There were no vitals filed for this visit.  Subjective Assessment - 01/18/18 0856    Subjective  Pt reports compliance with her HEP. She reports her side is feeling about the same today. She said she had a bad attack with it last night when a very sharp pain came into her side while she was laying down.    How long can you sit comfortably?  no issues    How long can you stand comfortably?  no issues    How long can you walk comfortably?  hasn't noticed side/flank pain when walking    Patient Stated Goals  ease this pain    Currently in Pain?  No/denies             [facility] Adult PT Treatment/Exercise - 01/18/18 0001      Bed Mobility   Bed Mobility  Supine to Sit;Sit to Supine    Supine to Sit  Minimal Assistance - Patient > 75%    Sit to Supine  Minimal Assistance - Patient >  75%      Exercises   Exercises  Lumbar      Lumbar Exercises: Stretches   Press Ups Limitations  supine good morning stretch (in hooklying position) 5x15"    Other Lumbar Stretch Exercise  seated R external oblique stretch (L LF with RUE OH) 10x10"    Other Lumbar Stretch Exercise  seated in chair R rotation stretch to stretch R external oblique 10x10"      Lumbar Exercises: Supine   Ab Set  15 reps;5 seconds    Bent Knee Raise  10 reps;3 seconds    Bridge Limitations  too difficult for pt and increased R side pain    Other Supine Lumbar Exercises  glute sets in hooklying position 10x3" holds      Manual Therapy   Manual Therapy  Soft tissue mobilization    Manual therapy comments  completed separate rest of treatment    Soft tissue mobilization  STM R oblique with pt in sidelying to decrease restrictions and pain            PT Education  - 01/18/18 0855    Education Details  reviewed goals and issued copy of eval; exercise technique    Person(s) Educated  Patient    Methods  Explanation;Demonstration;Handout    Comprehension  Verbalized understanding;Returned demonstration       PT Short Term Goals - 01/18/18 0947      PT SHORT TERM GOAL #1   Title  Pt will be independent with HEP in order to decrease pain and improve function.    Time  2    Period  Weeks    Status  On-going      PT SHORT TERM GOAL #2   Title  Pt will have 1/2 grade improvement in MMT in order to decrease stress/strain on back/side and maximize her functional mobility with less pain.    Time  2    Period  Weeks    Status  On-going        PT Long Term Goals - 01/18/18 0947      PT LONG TERM GOAL #1   Title  Pt will report being able to transfer in/out of her car with 2/10 R side pain or < in order to demo improved function and return to PLOF.    Time  4    Period  Weeks    Status  On-going      PT LONG TERM GOAL #2   Title  Pt will be mod I with bed mobility and report 2/10 R side pain or < in order to maximize her function at home and promote improved sleep.    Time  4    Status  On-going      PT LONG TERM GOAL #3   Title  Pt will have WFL lumbar ROM and no pain on R side with L rotation or R lateral flexion in order to demo improved R external oblique soft tissue length and flexibility in order to maximize her functional mobility and decrease pain.    Time  4    Period  Weeks    Status  On-going            Plan - 01/18/18 0946    Clinical Impression Statement  Began session with reviewing initial goals and issuing copy of eval; no f/u questions afterwards. Initiated stretching of R external oblique, thoracic mobility, core and BLE strengthening. Pt reporting that the stretching was addressing  her same location of pain. Min cues required for therex; able to progress ab sets slightly this date with bent knee raise but unable to do  bridges due to pain and difficulty so had pt perform glute sets instead. Ended with manual STM to R external oblique to decrease pain and restrictions; pt reporting that L trunk rotation felt better but that R lateral flexion felt about the same following manual. Continue as planned, progressing as able.     Rehab Potential  Good    PT Frequency  2x / week    PT Duration  4 weeks    PT Treatment/Interventions  ADLs/Self Care Home Management;Aquatic Therapy;Cryotherapy;Electrical Stimulation;Moist Heat;Ultrasound;DME Instruction;Gait training;Stair training;Functional mobility training;Therapeutic activities;Therapeutic exercise;Balance training;Neuromuscular re-education;Patient/family education;Manual techniques;Passive range of motion;Dry needling;Energy conservation;Taping    PT Next Visit Plan  continue stretching for R obliques, core, functional, and BLE strengthening, thoracic/lumbar mobility, as well as manual STM to decrease soft tissue restrictions and pain in R obliques    PT Home Exercise Plan  eval: supine ab set    Consulted and Agree with Plan of Care  Patient       Patient will benefit from skilled therapeutic intervention in order to improve the following deficits and impairments:  Decreased activity tolerance, Decreased mobility, Decreased range of motion, Decreased strength, Increased muscle spasms, Impaired flexibility, Improper body mechanics, Postural dysfunction, Pain, Obesity  Visit Diagnosis: Other muscle spasm  Muscle weakness (generalized)  Other symptoms and signs involving the musculoskeletal system     Problem List Patient Active Problem List   Diagnosis Date Noted  . Asthma 05/14/2015  . Heel spur 02/24/2015  . Depression 02/24/2015  . Post-menopausal bleeding 06/02/2014  . OA (osteoarthritis) of knee 04/09/2014  . Epidermal cyst 10/01/2013  . Morbid obesity ([place]) 02/25/2013  . Back pain 03/04/2012  . Gout 01/25/2012  . Glucose intolerance (impaired  glucose tolerance) 04/01/2011  . Edema 01/28/2011  . OBESITY HYPOVENTILATION SYNDROME 06/16/2008  . OBSTRUCTIVE SLEEP APNEA 05/08/2008  . Hyperlipidemia 08/28/2006  . Essential hypertension 08/28/2006  . GERD 08/28/2006  . Osteoarthritis 08/28/2006       Geraldine Solar PT, DPT  [facility] [address] [place], [place], [address] Phone: [phone]   Fax:  [phone]  Name: [name] MRN: [id number] Date of Birth: 11-Jan-1951

## 2018-01-20 DIAGNOSIS — J449 Chronic obstructive pulmonary disease, unspecified: Secondary | ICD-10-CM | POA: Diagnosis not present

## 2018-01-23 ENCOUNTER — Encounter (HOSPITAL_COMMUNITY): Payer: Self-pay

## 2018-01-23 ENCOUNTER — Ambulatory Visit (HOSPITAL_COMMUNITY): Payer: Medicare Other | Attending: Family Medicine

## 2018-01-23 DIAGNOSIS — R29898 Other symptoms and signs involving the musculoskeletal system: Secondary | ICD-10-CM

## 2018-01-23 DIAGNOSIS — M6281 Muscle weakness (generalized): Secondary | ICD-10-CM | POA: Insufficient documentation

## 2018-01-23 DIAGNOSIS — M62838 Other muscle spasm: Secondary | ICD-10-CM | POA: Diagnosis not present

## 2018-01-23 NOTE — Therapy (Signed)
[place], [place], [address] Phone: [phone]   Fax:  [phone]  Physical Therapy Treatment  Patient Details  Name: [name] MRN: [id number] Date of Birth: [birth year] Referring Provider: Delsa Grana, PA-C (at [facility] office)   Encounter Date: 01/23/2018  PT End of Session - 01/23/18 1130    Visit Number  3    Number of Visits  9    Date for PT Re-Evaluation  02/14/18    Authorization Type  [employer] Medicare    Authorization Time Period  01/17/18 to 02/14/18    Authorization - Visit Number  3    Authorization - Number of Visits  10    PT Start Time  1093    PT Stop Time  1158    PT Time Calculation (min)  40 min    Activity Tolerance  Patient tolerated treatment well;No increased pain    Behavior During Therapy  WFL for tasks assessed/performed       Past Medical History:  Diagnosis Date  . Achilles tendinitis   . Anxiety   . Asthma   . Depression   . GERD (gastroesophageal reflux disease)   . Gout   . H/O myasthenia gravis    left eye  . HTN (hypertension)   . Hyperlipidemia   . Low back pain   . Obesity hypoventilation syndrome ([place])   . Obstructive sleep apnea   . Osteoarthritis     Past Surgical History:  Procedure Laterality Date  . CARDIAC CATHETERIZATION  2006  . CATARACT EXTRACTION W/PHACO Left 09/17/2015   Procedure: CATARACT EXTRACTION PHACO AND INTRAOCULAR LENS PLACEMENT LEFT EYE cde=8.58;  Surgeon: Tonny Branch, MD;  Location: [facility];  Service: Ophthalmology;  Laterality: Left;  . CATARACT EXTRACTION W/PHACO Right 05/15/2017   Procedure: CATARACT EXTRACTION PHACO AND INTRAOCULAR LENS PLACEMENT (IOC);  Surgeon: Tonny Branch, MD;  Location: [facility];  Service: Ophthalmology;  Laterality: Right;  CDE: 6.34  . COLONOSCOPY N/A 12/25/2013   Procedure: COLONOSCOPY;  Surgeon: Rogene Houston, MD;  [facility];  Service: Endoscopy;  Laterality: N/A;  730-rescheduled Ann  notified pt  . CYST EXCISION N/A 09/12/2016   Procedure: EXCISION SEBACEOUS CYST, BACK;  Surgeon: Aviva Signs, MD;  Location: [facility];  Service: General;  Laterality: N/A;  . INTRAOCULAR LENS INSERTION Bilateral 2018   Dr. Tonny Branch   . TUBAL LIGATION      There were no vitals filed for this visit.  Subjective Assessment - 01/23/18 1126    Subjective  Pt stated she continues to have intermittent pain on Rt side of body, pain scale 5/10 today.    Patient Stated Goals  ease this pain    Pain Score  5     Pain Location  Flank    Pain Orientation  Right    Pain Descriptors / Indicators  Tender    Pain Type  Chronic pain    Pain Onset  More than a month ago    Pain Frequency  Intermittent    Aggravating Factors   getting out of car    Pain Relieving Factors  unsure, pain meds assist    Effect of Pain on Daily Activities  limited                       [facility] Adult PT Treatment/Exercise - 01/23/18 0001      Bed Mobility   Bed Mobility  Supine  to Sit;Sit to Supine    Supine to Sit  Minimal Assistance - Patient > 75%    Sit to Supine  Minimal Assistance - Patient > 75%      Exercises   Exercises  Lumbar      Lumbar Exercises: Stretches   Single Knee to Chest Stretch  2 reps;30 seconds;Limitations    Single Knee to Chest Stretch Limitations  towel assistance    Lower Trunk Rotation  5 reps;10 seconds    Press Ups Limitations  supine good morning stretch (in hooklying position) 5x15"    Other Lumbar Stretch Exercise  seated R external oblique stretch (L LF with RUE OH) 10x10"    Other Lumbar Stretch Exercise  seated in chair R rotation stretch to stretch R external oblique 10x10"      Lumbar Exercises: Seated   Other Seated Lumbar Exercises  3D thoracic excursion with elbows bent and following eye direction for spinal mobility      Lumbar Exercises: Supine   Bent Knee Raise  10 reps;3 seconds    Bent Knee Raise Limitations  with ab set    Other Supine Lumbar  Exercises  glute sets in hooklying position 15x 5" holds      Manual Therapy   Manual Therapy  Soft tissue mobilization    Manual therapy comments  completed separate rest of treatment    Soft tissue mobilization  STM R oblique with pt in sidelying with pillow between knees and Rt UE overhead  to decrease restrictions and pain               PT Short Term Goals - 01/18/18 0947      PT SHORT TERM GOAL #1   Title  Pt will be independent with HEP in order to decrease pain and improve function.    Time  2    Period  Weeks    Status  On-going      PT SHORT TERM GOAL #2   Title  Pt will have 1/2 grade improvement in MMT in order to decrease stress/strain on back/side and maximize her functional mobility with less pain.    Time  2    Period  Weeks    Status  On-going        PT Long Term Goals - 01/18/18 0947      PT LONG TERM GOAL #1   Title  Pt will report being able to transfer in/out of her car with 2/10 R side pain or < in order to demo improved function and return to PLOF.    Time  4    Period  Weeks    Status  On-going      PT LONG TERM GOAL #2   Title  Pt will be mod I with bed mobility and report 2/10 R side pain or < in order to maximize her function at home and promote improved sleep.    Time  4    Status  On-going      PT LONG TERM GOAL #3   Title  Pt will have WFL lumbar ROM and no pain on R side with L rotation or R lateral flexion in order to demo improved R external oblique soft tissue length and flexibility in order to maximize her functional mobility and decrease pain.    Time  4    Period  Weeks    Status  On-going            Plan -  01/23/18 1145    Clinical Impression Statement  Began session instructing importance of proper bed mobility to reduce strain on lumbar musculature.  Added stretches this session to improve mobility wtih reports of relief following.  Continued core and proximal strengthening with cueing for breathing and appropraite  form/mechanics with activities.  No reports of increased pain through session.  EOS with manual soft tissue mobilization to address external oblique restrictions to descrease pain.    Rehab Potential  Good    PT Frequency  2x / week    PT Duration  4 weeks    PT Treatment/Interventions  ADLs/Self Care Home Management;Aquatic Therapy;Cryotherapy;Electrical Stimulation;Moist Heat;Ultrasound;DME Instruction;Gait training;Stair training;Functional mobility training;Therapeutic activities;Therapeutic exercise;Balance training;Neuromuscular re-education;Patient/family education;Manual techniques;Passive range of motion;Dry needling;Energy conservation;Taping    PT Next Visit Plan  continue stretching for R obliques, core, functional, and BLE strengthening, thoracic/lumbar mobility, as well as manual STM to decrease soft tissue restrictions and pain in R obliques    PT Home Exercise Plan  eval: supine ab set       Patient will benefit from skilled therapeutic intervention in order to improve the following deficits and impairments:  Decreased activity tolerance, Decreased mobility, Decreased range of motion, Decreased strength, Increased muscle spasms, Impaired flexibility, Improper body mechanics, Postural dysfunction, Pain, Obesity  Visit Diagnosis: Other muscle spasm  Muscle weakness (generalized)  Other symptoms and signs involving the musculoskeletal system     Problem List Patient Active Problem List   Diagnosis Date Noted  . Asthma 05/14/2015  . Heel spur 02/24/2015  . Depression 02/24/2015  . Post-menopausal bleeding 06/02/2014  . OA (osteoarthritis) of knee 04/09/2014  . Epidermal cyst 10/01/2013  . Morbid obesity ([place]) 02/25/2013  . Back pain 03/04/2012  . Gout 01/25/2012  . Glucose intolerance (impaired glucose tolerance) 04/01/2011  . Edema 01/28/2011  . OBESITY HYPOVENTILATION SYNDROME 06/16/2008  . OBSTRUCTIVE SLEEP APNEA 05/08/2008  . Hyperlipidemia 08/28/2006  .  Essential hypertension 08/28/2006  . GERD 08/28/2006  . Osteoarthritis 08/28/2006   Ihor Austin, [place]; [place]  Aldona Lento 01/23/2018, 12:07 PM  [facility] [address] [place], [place], [address] Phone: [phone]   Fax:  [phone]  Name: [name] MRN: [id number] Date of Birth: 1951/07/13

## 2018-01-24 ENCOUNTER — Ambulatory Visit (HOSPITAL_COMMUNITY): Payer: Medicare Other

## 2018-01-24 ENCOUNTER — Encounter (HOSPITAL_COMMUNITY): Payer: Self-pay

## 2018-01-24 DIAGNOSIS — R29898 Other symptoms and signs involving the musculoskeletal system: Secondary | ICD-10-CM

## 2018-01-24 DIAGNOSIS — M62838 Other muscle spasm: Secondary | ICD-10-CM

## 2018-01-24 DIAGNOSIS — M6281 Muscle weakness (generalized): Secondary | ICD-10-CM

## 2018-01-24 NOTE — Patient Instructions (Signed)
Bracing With Leg March (Hook-Lying)    With neutral spine, tighten pelvic floor and abdominals and hold. Alternating legs, lift foot 8 inches and return to floor. Repeat 10 times. Do 1 times a day.   Copyright  VHI. All rights reserved.

## 2018-01-24 NOTE — Therapy (Signed)
[place], [place], [address] Phone: [phone]   Fax:  [phone]  Physical Therapy Treatment  Patient Details  Name: [name] MRN: [id number] Date of Birth: Jun 15, 1951 Referring Provider: Delsa Grana, PA-C (at [facility] office)   Encounter Date: 01/24/2018  PT End of Session - 01/24/18 1309    Visit Number  4    Number of Visits  9    Date for PT Re-Evaluation  02/14/18    Authorization Type  UHC Medicare    Authorization Time Period  01/17/18 to 02/14/18    Authorization - Visit Number  4    Authorization - Number of Visits  10    PT Start Time  1062    PT Stop Time  1350    PT Time Calculation (min)  48 min    Activity Tolerance  Patient tolerated treatment well;Patient limited by pain;No increased pain    Behavior During Therapy  WFL for tasks assessed/performed       Past Medical History:  Diagnosis Date  . Achilles tendinitis   . Anxiety   . Asthma   . Depression   . GERD (gastroesophageal reflux disease)   . Gout   . H/O myasthenia gravis    left eye  . HTN (hypertension)   . Hyperlipidemia   . Low back pain   . Obesity hypoventilation syndrome ([place])   . Obstructive sleep apnea   . Osteoarthritis     Past Surgical History:  Procedure Laterality Date  . CARDIAC CATHETERIZATION  2006  . CATARACT EXTRACTION W/PHACO Left 09/17/2015   Procedure: CATARACT EXTRACTION PHACO AND INTRAOCULAR LENS PLACEMENT LEFT EYE cde=8.58;  Surgeon: Tonny Branch, MD;  Location: [facility];  Service: Ophthalmology;  Laterality: Left;  . CATARACT EXTRACTION W/PHACO Right 05/15/2017   Procedure: CATARACT EXTRACTION PHACO AND INTRAOCULAR LENS PLACEMENT (IOC);  Surgeon: Tonny Branch, MD;  Location: [facility];  Service: Ophthalmology;  Laterality: Right;  CDE: 6.34  . COLONOSCOPY N/A 12/25/2013   Procedure: COLONOSCOPY;  Surgeon: Rogene Houston, MD;  Location: [facility];  Service: Endoscopy;  Laterality: N/A;   730-rescheduled Ann notified pt  . CYST EXCISION N/A 09/12/2016   Procedure: EXCISION SEBACEOUS CYST, BACK;  Surgeon: Aviva Signs, MD;  Location: [facility];  Service: General;  Laterality: N/A;  . INTRAOCULAR LENS INSERTION Bilateral 2018   Dr. Tonny Branch   . TUBAL LIGATION      There were no vitals filed for this visit.  Subjective Assessment - 01/24/18 1305    Subjective  Pt stated she got an attack yesterday while walking, stated she almost fell.  Pain scale 5/10 Rt side of flank, achey pain.    Patient Stated Goals  ease this pain    Currently in Pain?  Yes    Pain Score  5     Pain Location  Flank    Pain Orientation  Right    Pain Descriptors / Indicators  Aching    Pain Type  Chronic pain    Pain Onset  More than a month ago    Pain Frequency  Intermittent    Aggravating Factors   getting out of car/bed    Pain Relieving Factors  unsure, pain meds assist    Effect of Pain on Daily Activities  limited                       [facility] Adult PT  Treatment/Exercise - 01/24/18 0001      Bed Mobility   Bed Mobility  Supine to Sit;Sit to Supine    Supine to Sit  Supervision/Verbal cueing    Sit to Supine  Supervision/Verbal cueing      Exercises   Exercises  Lumbar      Lumbar Exercises: Stretches   Single Knee to Chest Stretch  3 reps;30 seconds;Limitations    Single Knee to Chest Stretch Limitations  towel assistance    Lower Trunk Rotation  5 reps;10 seconds    Press Ups Limitations  supine good morning stretch (in hooklying position) 5x15"    Other Lumbar Stretch Exercise  seated R external oblique stretch (L LF with RUE OH) 10x10"    Other Lumbar Stretch Exercise  seated in chair R rotation stretch to stretch R external oblique 10x10"      Lumbar Exercises: Seated   Other Seated Lumbar Exercises  3D thoracic excursion with elbows bent and following eye direction for spinal mobility      Lumbar Exercises: Supine   Bent Knee Raise  10 reps;3 seconds    Bent  Knee Raise Limitations  with ab set    Bridge  5 reps    Bridge Limitations  unable to acheive full range, no reoprts of pain with task    Other Supine Lumbar Exercises  glute sets in hooklying position 15x 5" holds      Lumbar Exercises: Sidelying   Clam  Right;10 reps;3 seconds    Clam Limitations  unable to tolerate laying on Rt side      Manual Therapy   Manual Therapy  Soft tissue mobilization    Manual therapy comments  completed separate rest of treatment    Soft tissue mobilization  STM R oblique with pt in sidelying with pillow between knees and Rt UE overhead  to decrease restrictions and pain               PT Short Term Goals - 01/18/18 0947      PT SHORT TERM GOAL #1   Title  Pt will be independent with HEP in order to decrease pain and improve function.    Time  2    Period  Weeks    Status  On-going      PT SHORT TERM GOAL #2   Title  Pt will have 1/2 grade improvement in MMT in order to decrease stress/strain on back/side and maximize her functional mobility with less pain.    Time  2    Period  Weeks    Status  On-going        PT Long Term Goals - 01/18/18 0947      PT LONG TERM GOAL #1   Title  Pt will report being able to transfer in/out of her car with 2/10 R side pain or < in order to demo improved function and return to PLOF.    Time  4    Period  Weeks    Status  On-going      PT LONG TERM GOAL #2   Title  Pt will be mod I with bed mobility and report 2/10 R side pain or < in order to maximize her function at home and promote improved sleep.    Time  4    Status  On-going      PT LONG TERM GOAL #3   Title  Pt will have WFL lumbar ROM and no pain on R  side with L rotation or R lateral flexion in order to demo improved R external oblique soft tissue length and flexibility in order to maximize her functional mobility and decrease pain.    Time  4    Period  Weeks    Status  On-going            Plan - 01/24/18 1327    Clinical  Impression Statement  Reviewed proper bed mobility, pt able to acheive correct technqiue with supine to sit though still required instructions for getting onto back.  Continued wiht lumbar mobility stretches and core/gluteal strengthening exercises.  Pt able to complete partial bridges with no reports of pain, unable to acheive full range due to weakness.  Added clams for gluteal strengthening, pt unable to tolerate laying on Rt side.  EOS with manual to address soft tissue restrictions Rt obliques.  No reoprts of increased pain through session.      Rehab Potential  Good    PT Frequency  2x / week    PT Duration  4 weeks    PT Treatment/Interventions  ADLs/Self Care Home Management;Aquatic Therapy;Cryotherapy;Electrical Stimulation;Moist Heat;Ultrasound;DME Instruction;Gait training;Stair training;Functional mobility training;Therapeutic activities;Therapeutic exercise;Balance training;Neuromuscular re-education;Patient/family education;Manual techniques;Passive range of motion;Dry needling;Energy conservation;Taping    PT Next Visit Plan  continue stretching for R obliques, core, functional, and BLE strengthening, thoracic/lumbar mobility, as well as manual STM to decrease soft tissue restrictions and pain in R obliques    PT Home Exercise Plan  eval: supine ab set; 01/24/18: bent knee raise       Patient will benefit from skilled therapeutic intervention in order to improve the following deficits and impairments:  Decreased activity tolerance, Decreased mobility, Decreased range of motion, Decreased strength, Increased muscle spasms, Impaired flexibility, Improper body mechanics, Postural dysfunction, Pain, Obesity  Visit Diagnosis: Other muscle spasm  Muscle weakness (generalized)  Other symptoms and signs involving the musculoskeletal system     Problem List Patient Active Problem List   Diagnosis Date Noted  . Asthma 05/14/2015  . Heel spur 02/24/2015  . Depression 02/24/2015  .  Post-menopausal bleeding 06/02/2014  . OA (osteoarthritis) of knee 04/09/2014  . Epidermal cyst 10/01/2013  . Morbid obesity ([place]) 02/25/2013  . Back pain 03/04/2012  . Gout 01/25/2012  . Glucose intolerance (impaired glucose tolerance) 04/01/2011  . Edema 01/28/2011  . OBESITY HYPOVENTILATION SYNDROME 06/16/2008  . OBSTRUCTIVE SLEEP APNEA 05/08/2008  . Hyperlipidemia 08/28/2006  . Essential hypertension 08/28/2006  . GERD 08/28/2006  . Osteoarthritis 08/28/2006   Ihor Austin, [place]; [place]  Aldona Lento 01/24/2018, 1:54 PM  [place] [address] [place], [place], [address] Phone: [phone]   Fax:  [phone]  Name: DELFINA SCHREURS MRN: [id number] Date of Birth: 1950/09/15

## 2018-01-30 ENCOUNTER — Encounter (HOSPITAL_COMMUNITY): Payer: Self-pay

## 2018-01-30 ENCOUNTER — Ambulatory Visit (HOSPITAL_COMMUNITY): Payer: Medicare Other

## 2018-01-30 DIAGNOSIS — M6281 Muscle weakness (generalized): Secondary | ICD-10-CM

## 2018-01-30 DIAGNOSIS — M62838 Other muscle spasm: Secondary | ICD-10-CM | POA: Diagnosis not present

## 2018-01-30 DIAGNOSIS — R29898 Other symptoms and signs involving the musculoskeletal system: Secondary | ICD-10-CM | POA: Diagnosis not present

## 2018-01-30 NOTE — Therapy (Signed)
[place], [place], [address] Phone: [phone]   Fax:  [phone]  Physical Therapy Treatment  Patient Details  Name: [name] MRN: [id number] Date of Birth: [birth year] Referring Provider: Delsa Grana, PA-C (at [facility] office)   Encounter Date: 01/30/2018  PT End of Session - 01/30/18 0905    Visit Number  5    Number of Visits  9    Date for PT Re-Evaluation  02/14/18    Authorization Type  UHC Medicare    Authorization Time Period  01/17/18 to 02/14/18    Authorization - Visit Number  5    Authorization - Number of Visits  10    PT Start Time  0900    Activity Tolerance  Patient tolerated treatment well;Patient limited by pain;No increased pain    Behavior During Therapy  WFL for tasks assessed/performed       Past Medical History:  Diagnosis Date  . Achilles tendinitis   . Anxiety   . Asthma   . Depression   . GERD (gastroesophageal reflux disease)   . Gout   . H/O myasthenia gravis    left eye  . HTN (hypertension)   . Hyperlipidemia   . Low back pain   . Obesity hypoventilation syndrome ([place])   . Obstructive sleep apnea   . Osteoarthritis     Past Surgical History:  Procedure Laterality Date  . CARDIAC CATHETERIZATION  2006  . CATARACT EXTRACTION W/PHACO Left 09/17/2015   Procedure: CATARACT EXTRACTION PHACO AND INTRAOCULAR LENS PLACEMENT LEFT EYE cde=8.58;  Surgeon: Tonny Branch, MD;  Location: [facility];  Service: Ophthalmology;  Laterality: Left;  . CATARACT EXTRACTION W/PHACO Right 05/15/2017   Procedure: CATARACT EXTRACTION PHACO AND INTRAOCULAR LENS PLACEMENT (IOC);  Surgeon: Tonny Branch, MD;  Location: [facility];  Service: Ophthalmology;  Laterality: Right;  CDE: 6.34  . COLONOSCOPY N/A 12/25/2013   Procedure: COLONOSCOPY;  Surgeon: Rogene Houston, MD;  [facility]: [facility];  Service: Endoscopy;  Laterality: N/A;  730-rescheduled Ann notified pt  . CYST EXCISION N/A  09/12/2016   Procedure: EXCISION SEBACEOUS CYST, BACK;  Surgeon: Aviva Signs, MD;  Location: [facility];  Service: General;  Laterality: N/A;  . INTRAOCULAR LENS INSERTION Bilateral 2018   Dr. Tonny Branch   . TUBAL LIGATION      There were no vitals filed for this visit.  Subjective Assessment - 01/30/18 0903    Subjective  Pt reports that she is getting better. Her pain is not as bad as it was and it is not frequent as it was before starting.    Patient Stated Goals  ease this pain    Currently in Pain?  Yes    Pain Score  2     Pain Location  Flank    Pain Orientation  Right    Pain Descriptors / Indicators  Aching    Pain Type  Chronic pain    Pain Onset  More than a month ago    Pain Frequency  Intermittent    Aggravating Factors   getting out of car/bed    Pain Relieving Factors  unsure, pain meds assist    Effect of Pain on Daily Activities  limited            [facility] Adult PT Treatment/Exercise - 01/30/18 0001      Lumbar Exercises: Stretches   Gastroc Stretch Limitations  seated lumbar flexion with L lateral flexion with  medium physioball 10x15" holds    Other Lumbar Stretch Exercise  seated R external oblique stretch (L LF with RUE OH) 10x10"    Other Lumbar Stretch Exercise  seated in chair R rotation stretch to stretch R external oblique 10x10"      Lumbar Exercises: Seated   Sit to Stand Limitations  bil trunk lateral flexion with 3# weight, x10 each (during R lateral flexion 5 sec return to upright posture and 5 sec lower during L lateral flexion for eccentric strengthening)    Other Seated Lumbar Exercises  seated L trunk rotation, arms extended, RTB 2x10 reps (max cueing for technique)      Manual Therapy   Manual Therapy  Soft tissue mobilization    Manual therapy comments  completed separate rest of treatment    Soft tissue mobilization  STM R oblique with pt in sidelying with pillow between knees and Rt UE overhead  to decrease restrictions and pain               PT Short Term Goals - 01/18/18 0947      PT SHORT TERM GOAL #1   Title  Pt will be independent with HEP in order to decrease pain and improve function.    Time  2    Period  Weeks    Status  On-going      PT SHORT TERM GOAL #2   Title  Pt will have 1/2 grade improvement in MMT in order to decrease stress/strain on back/side and maximize her functional mobility with less pain.    Time  2    Period  Weeks    Status  On-going        PT Long Term Goals - 01/18/18 0947      PT LONG TERM GOAL #1   Title  Pt will report being able to transfer in/out of her car with 2/10 R side pain or < in order to demo improved function and return to PLOF.    Time  4    Period  Weeks    Status  On-going      PT LONG TERM GOAL #2   Title  Pt will be mod I with bed mobility and report 2/10 R side pain or < in order to maximize her function at home and promote improved sleep.    Time  4    Status  On-going      PT LONG TERM GOAL #3   Title  Pt will have WFL lumbar ROM and no pain on R side with L rotation or R lateral flexion in order to demo improved R external oblique soft tissue length and flexibility in order to maximize her functional mobility and decrease pain.    Time  4    Period  Weeks    Status  On-going            Plan - 01/30/18 0940    Clinical Impression Statement  Pt reporting decreased pain intensity and frequency overall. Continued with established POC focusing on stretching her R external oblique, core strengthening, and addressing soft tissue restrictions in R oblique. Initiated isolated R oblique strengthening this date; no pain reported during, just reports of feeling the muscle activation. Updated her HEP this date to include oblique stretches. Min cues required throughout for proper form. Ended with manual for continued pain control. No pain reported at EOS. Continue as planned, progressing as able.     Rehab Potential  Good  PT Frequency  2x / week     PT Duration  4 weeks    PT Treatment/Interventions  ADLs/Self Care Home Management;Aquatic Therapy;Cryotherapy;Electrical Stimulation;Moist Heat;Ultrasound;DME Instruction;Gait training;Stair training;Functional mobility training;Therapeutic activities;Therapeutic exercise;Balance training;Neuromuscular re-education;Patient/family education;Manual techniques;Passive range of motion;Dry needling;Energy conservation;Taping    PT Next Visit Plan  continue stretching/strengthening for R obliques, core, functional, and BLE strengthening, thoracic/lumbar mobility, as well as manual STM to decrease soft tissue restrictions and pain in R obliques    PT Home Exercise Plan  eval: supine ab set; 01/24/18: bent knee raise; 7/8: seated trunk L LF and R rotation stretching    Consulted and Agree with Plan of Care  Patient       Patient will benefit from skilled therapeutic intervention in order to improve the following deficits and impairments:  Decreased activity tolerance, Decreased mobility, Decreased range of motion, Decreased strength, Increased muscle spasms, Impaired flexibility, Improper body mechanics, Postural dysfunction, Pain, Obesity  Visit Diagnosis: Other muscle spasm  Muscle weakness (generalized)  Other symptoms and signs involving the musculoskeletal system     Problem List Patient Active Problem List   Diagnosis Date Noted  . Asthma 05/14/2015  . Heel spur 02/24/2015  . Depression 02/24/2015  . Post-menopausal bleeding 06/02/2014  . OA (osteoarthritis) of knee 04/09/2014  . Epidermal cyst 10/01/2013  . Morbid obesity ([place]) 02/25/2013  . Back pain 03/04/2012  . Gout 01/25/2012  . Glucose intolerance (impaired glucose tolerance) 04/01/2011  . Edema 01/28/2011  . OBESITY HYPOVENTILATION SYNDROME 06/16/2008  . OBSTRUCTIVE SLEEP APNEA 05/08/2008  . Hyperlipidemia 08/28/2006  . Essential hypertension 08/28/2006  . GERD 08/28/2006  . Osteoarthritis 08/28/2006       Geraldine Solar PT, DPT  [place] [address] [place], [place], [address] Phone: [phone]   Fax:  [phone]  Name: GYNETH HUBKA MRN: [id number] Date of Birth: August 13, 1950

## 2018-02-01 ENCOUNTER — Ambulatory Visit (HOSPITAL_COMMUNITY): Payer: Medicare Other

## 2018-02-01 DIAGNOSIS — M6281 Muscle weakness (generalized): Secondary | ICD-10-CM | POA: Diagnosis not present

## 2018-02-01 DIAGNOSIS — M62838 Other muscle spasm: Secondary | ICD-10-CM | POA: Diagnosis not present

## 2018-02-01 DIAGNOSIS — R29898 Other symptoms and signs involving the musculoskeletal system: Secondary | ICD-10-CM

## 2018-02-01 NOTE — Therapy (Signed)
[place], [place], [address] Phone: [phone]   Fax:  [phone]  Physical Therapy Treatment  Patient Details  Name: [name] MRN: [id number] Date of Birth: Nov 01, 1950 Referring Provider: Delsa Grana, PA-C (at [facility] office)   Encounter Date: 02/01/2018  PT End of Session - 02/01/18 0902    Visit Number  6    Number of Visits  9    Date for PT Re-Evaluation  02/14/18    Authorization Type  UHC Medicare    Authorization Time Period  01/17/18 to 02/14/18    Authorization - Visit Number  6    Authorization - Number of Visits  10    PT Start Time  0901    PT Stop Time  0939    PT Time Calculation (min)  38 min    Activity Tolerance  Patient tolerated treatment well;Patient limited by pain;No increased pain    Behavior During Therapy  WFL for tasks assessed/performed       Past Medical History:  Diagnosis Date  . Achilles tendinitis   . Anxiety   . Asthma   . Depression   . GERD (gastroesophageal reflux disease)   . Gout   . H/O myasthenia gravis    left eye  . HTN (hypertension)   . Hyperlipidemia   . Low back pain   . Obesity hypoventilation syndrome ([place])   . Obstructive sleep apnea   . Osteoarthritis     Past Surgical History:  Procedure Laterality Date  . CARDIAC CATHETERIZATION  2006  . CATARACT EXTRACTION W/PHACO Left 09/17/2015   Procedure: CATARACT EXTRACTION PHACO AND INTRAOCULAR LENS PLACEMENT LEFT EYE cde=8.58;  Surgeon: Tonny Branch, MD;  [facility]: [facility];  Service: Ophthalmology;  Laterality: Left;  . CATARACT EXTRACTION W/PHACO Right 05/15/2017   Procedure: CATARACT EXTRACTION PHACO AND INTRAOCULAR LENS PLACEMENT (IOC);  Surgeon: Tonny Branch, MD;  Location: [facility];  Service: Ophthalmology;  Laterality: Right;  CDE: 6.34  . COLONOSCOPY N/A 12/25/2013   Procedure: COLONOSCOPY;  Surgeon: Rogene Houston, MD;  [facility];  Service: Endoscopy;  Laterality: N/A;   730-rescheduled Ann notified pt  . CYST EXCISION N/A 09/12/2016   Procedure: EXCISION SEBACEOUS CYST, BACK;  Surgeon: Aviva Signs, MD;  Location: [facility];  Service: General;  Laterality: N/A;  . INTRAOCULAR LENS INSERTION Bilateral 2018   Dr. Tonny Branch   . TUBAL LIGATION      There were no vitals filed for this visit.  Subjective Assessment - 02/01/18 0903    Subjective  Pt continues to report improvements. She still has her pain when she gets in and out of her car but it is much improved.    Patient Stated Goals  ease this pain    Currently in Pain?  No/denies    Pain Onset  More than a month ago            [facility] Adult PT Treatment/Exercise - 02/01/18 0001      Lumbar Exercises: Stretches   Gastroc Stretch Limitations  seated lumbar flexion with L lateral flexion with medium physioball 10x15" holds    Other Lumbar Stretch Exercise  standing L trunk LF and R rotation to stretch R external oblique 10x10" each      Lumbar Exercises: Seated   Hip Flexion on Ball  Both;10 reps    Hip Flexion on Ball Limitations  dyna disc    Other Seated Lumbar Exercises  seated  palov press with RTB 2x10 (RTB anchored on pt's R side)    Other Seated Lumbar Exercises  seated L trunk rotation, arms extended, RTB 2x10 reps (max cueing for technique)      Manual Therapy   Manual Therapy  Soft tissue mobilization    Manual therapy comments  completed separate rest of treatment    Soft tissue mobilization  STM R oblique with pt in sidelying to decrease restrictions and pain           PT Education - 02/01/18 0902    Education Details  updated HEP, exercise technique    Person(s) Educated  Patient    Methods  Explanation;Demonstration;Handout    Comprehension  Verbalized understanding;Returned demonstration       PT Short Term Goals - 01/18/18 0947      PT SHORT TERM GOAL #1   Title  Pt will be independent with HEP in order to decrease pain and improve function.    Time  2    Period   Weeks    Status  On-going      PT SHORT TERM GOAL #2   Title  Pt will have 1/2 grade improvement in MMT in order to decrease stress/strain on back/side and maximize her functional mobility with less pain.    Time  2    Period  Weeks    Status  On-going        PT Long Term Goals - 01/18/18 0947      PT LONG TERM GOAL #1   Title  Pt will report being able to transfer in/out of her car with 2/10 R side pain or < in order to demo improved function and return to PLOF.    Time  4    Period  Weeks    Status  On-going      PT LONG TERM GOAL #2   Title  Pt will be mod I with bed mobility and report 2/10 R side pain or < in order to maximize her function at home and promote improved sleep.    Time  4    Status  On-going      PT LONG TERM GOAL #3   Title  Pt will have WFL lumbar ROM and no pain on R side with L rotation or R lateral flexion in order to demo improved R external oblique soft tissue length and flexibility in order to maximize her functional mobility and decrease pain.    Time  4    Period  Weeks    Status  On-going            Plan - 02/01/18 0350    Clinical Impression Statement  Pt with continued reports of improvements overall. Her main limitation now is that she is still having slight pain with getting in/out of her car; PT explained that this is likely due to her actively using her R external oblique to rotate her trunk to get out of her car. Continued with stretching (progressed to standing), external oblique strengthening and core strengthening. Pt challenged with marching on dyna disc and required UE assistance. Ended with manual to address continued restrictions, though noted improvements. Continue as planned, progressing as able.    Rehab Potential  Good    PT Frequency  2x / week    PT Duration  4 weeks    PT Treatment/Interventions  ADLs/Self Care Home Management;Aquatic Therapy;Cryotherapy;Electrical Stimulation;Moist Heat;Ultrasound;DME Instruction;Gait  training;Stair training;Functional mobility training;Therapeutic activities;Therapeutic exercise;Balance training;Neuromuscular re-education;Patient/family education;Manual  techniques;Passive range of motion;Dry needling;Energy conservation;Taping    PT Next Visit Plan  continue stretching/strengthening for R obliques, core, functional, and BLE strengthening, thoracic/lumbar mobility, as well as manual STM to decrease soft tissue restrictions and pain in R obliques    PT Home Exercise Plan  eval: supine ab set; 01/24/18: bent knee raise; 7/8: seated trunk L LF and R rotation stretching; 7/10: 3D throacic excursions, LTRs    Consulted and Agree with Plan of Care  Patient       Patient will benefit from skilled therapeutic intervention in order to improve the following deficits and impairments:  Decreased activity tolerance, Decreased mobility, Decreased range of motion, Decreased strength, Increased muscle spasms, Impaired flexibility, Improper body mechanics, Postural dysfunction, Pain, Obesity  Visit Diagnosis: Other muscle spasm  Muscle weakness (generalized)  Other symptoms and signs involving the musculoskeletal system     Problem List Patient Active Problem List   Diagnosis Date Noted  . Asthma 05/14/2015  . Heel spur 02/24/2015  . Depression 02/24/2015  . Post-menopausal bleeding 06/02/2014  . OA (osteoarthritis) of knee 04/09/2014  . Epidermal cyst 10/01/2013  . Morbid obesity ([place]) 02/25/2013  . Back pain 03/04/2012  . Gout 01/25/2012  . Glucose intolerance (impaired glucose tolerance) 04/01/2011  . Edema 01/28/2011  . OBESITY HYPOVENTILATION SYNDROME 06/16/2008  . OBSTRUCTIVE SLEEP APNEA 05/08/2008  . Hyperlipidemia 08/28/2006  . Essential hypertension 08/28/2006  . GERD 08/28/2006  . Osteoarthritis 08/28/2006       Geraldine Solar PT, DPT  [place] [address] [place], [place], [address] Phone: [phone]   Fax:   [phone]  Name: ELNOR RENOVATO MRN: [id number] Date of Birth: May 08, 1951

## 2018-02-05 ENCOUNTER — Other Ambulatory Visit: Payer: Self-pay | Admitting: Family Medicine

## 2018-02-05 MED ORDER — HYDROCODONE-ACETAMINOPHEN 7.5-325 MG PO TABS: 1.0000 | ORAL_TABLET | Freq: Two times a day (BID) | ORAL | 0 refills | Status: DC | PRN

## 2018-02-05 NOTE — Telephone Encounter (Signed)
Patient needs refill on her hydrocodone  [facility]   [address]  [phone]

## 2018-02-05 NOTE — Telephone Encounter (Signed)
Ok to refill??  Last office visit 01/04/2018.  Last refill 01/05/2018.

## 2018-02-06 ENCOUNTER — Ambulatory Visit (HOSPITAL_COMMUNITY): Payer: Medicare Other

## 2018-02-06 ENCOUNTER — Encounter (HOSPITAL_COMMUNITY): Payer: Self-pay

## 2018-02-06 DIAGNOSIS — M6281 Muscle weakness (generalized): Secondary | ICD-10-CM

## 2018-02-06 DIAGNOSIS — M62838 Other muscle spasm: Secondary | ICD-10-CM | POA: Diagnosis not present

## 2018-02-06 DIAGNOSIS — R29898 Other symptoms and signs involving the musculoskeletal system: Secondary | ICD-10-CM

## 2018-02-06 NOTE — Therapy (Signed)
[place], [place], [address] Phone: [phone]   Fax:  [phone]  Physical Therapy Treatment  Patient Details  Name: [name] MRN: [id number] Date of Birth: 01-25-1951 Referring Provider: Delsa Grana, PA-C (at [facility] office)   Encounter Date: 02/06/2018  PT End of Session - 02/06/18 0916    Visit Number  7    Number of Visits  9    Date for PT Re-Evaluation  02/14/18    Authorization Type  [employer] Medicare    Authorization Time Period  01/17/18 to 02/14/18    Authorization - Visit Number  7    Authorization - Number of Visits  10    PT Start Time  0904    PT Stop Time  0942    PT Time Calculation (min)  38 min    Activity Tolerance  Patient tolerated treatment well;No increased pain    Behavior During Therapy  WFL for tasks assessed/performed       Past Medical History:  Diagnosis Date  . Achilles tendinitis   . Anxiety   . Asthma   . Depression   . GERD (gastroesophageal reflux disease)   . Gout   . H/O myasthenia gravis    left eye  . HTN (hypertension)   . Hyperlipidemia   . Low back pain   . Obesity hypoventilation syndrome ([place])   . Obstructive sleep apnea   . Osteoarthritis     Past Surgical History:  Procedure Laterality Date  . CARDIAC CATHETERIZATION  2006  . CATARACT EXTRACTION W/PHACO Left 09/17/2015   Procedure: CATARACT EXTRACTION PHACO AND INTRAOCULAR LENS PLACEMENT LEFT EYE cde=8.58;  Surgeon: Tonny Branch, MD;  Location: [facility];  Service: Ophthalmology;  Laterality: Left;  . CATARACT EXTRACTION W/PHACO Right 05/15/2017   Procedure: CATARACT EXTRACTION PHACO AND INTRAOCULAR LENS PLACEMENT (IOC);  Surgeon: Tonny Branch, MD;  Location: [facility];  Service: Ophthalmology;  Laterality: Right;  CDE: 6.34  . COLONOSCOPY N/A 12/25/2013   Procedure: COLONOSCOPY;  Surgeon: Rogene Houston, MD;  Location: [facility];  Service: Endoscopy;  Laterality: N/A;  730-rescheduled Ann  notified pt  . CYST EXCISION N/A 09/12/2016   Procedure: EXCISION SEBACEOUS CYST, BACK;  Surgeon: Aviva Signs, MD;  Location: [facility];  Service: General;  Laterality: N/A;  . INTRAOCULAR LENS INSERTION Bilateral 2018   Dr. Tonny Branch   . TUBAL LIGATION      There were no vitals filed for this visit.  Subjective Assessment - 02/06/18 0904    Subjective  Pt stated she is feeling good today.  Reports vast improvements with pain, no pain getting in and out of car.    Patient Stated Goals  ease this pain    Currently in Pain?  No/denies                       [facility] Adult PT Treatment/Exercise - 02/06/18 0001      Lumbar Exercises: Stretches   Gastroc Stretch Limitations  seated lumbar flexion with L lateral flexion with medium physioball 10x15" holds    Other Lumbar Stretch Exercise  standing L trunk LF and R rotation to stretch R external oblique 10x10" each    Other Lumbar Stretch Exercise  seated in chair R rotation stretch to stretch R external oblique 10x10"      Lumbar Exercises: Seated   Hip Flexion on Ball  Both;10 reps    Hip Flexion on  Ball Limitations  dyna disc    Other Seated Lumbar Exercises  seated palov press with RTB 2x10 (RTB anchored on pt's R side); seated abd stepping over 6in hurdles with Lt LE 10x    Other Seated Lumbar Exercises  seated L trunk rotation, arms extended, RTB 2x10 reps (max cueing for technique)      Manual Therapy   Manual Therapy  Soft tissue mobilization    Manual therapy comments  completed separate rest of treatment    Soft tissue mobilization  STM R oblique with pt in sidelying to decrease restrictions and pain               PT Short Term Goals - 01/18/18 0947      PT SHORT TERM GOAL #1   Title  Pt will be independent with HEP in order to decrease pain and improve function.    Time  2    Period  Weeks    Status  On-going      PT SHORT TERM GOAL #2   Title  Pt will have 1/2 grade improvement in MMT in order to  decrease stress/strain on back/side and maximize her functional mobility with less pain.    Time  2    Period  Weeks    Status  On-going        PT Long Term Goals - 01/18/18 0947      PT LONG TERM GOAL #1   Title  Pt will report being able to transfer in/out of her car with 2/10 R side pain or < in order to demo improved function and return to PLOF.    Time  4    Period  Weeks    Status  On-going      PT LONG TERM GOAL #2   Title  Pt will be mod I with bed mobility and report 2/10 R side pain or < in order to maximize her function at home and promote improved sleep.    Time  4    Status  On-going      PT LONG TERM GOAL #3   Title  Pt will have WFL lumbar ROM and no pain on R side with L rotation or R lateral flexion in order to demo improved R external oblique soft tissue length and flexibility in order to maximize her functional mobility and decrease pain.    Time  4    Period  Weeks    Status  On-going            Plan - 02/06/18 4627    Clinical Impression Statement  Pt reports vast improvements getting in and out of her car.  Continued session focus with core strengthening and stretches to external oblique.  Pt improving seated balance this session compared to last session on dynamic surface, some unsteadiness continued but minimal UE assistance required.  Added seated hurdle step over to simulate getting out of vehicle, able to complete with minimal difficulty.  EOS wiht manual to address soft tissue restrictions of Rt external oblique to reduce tightness for mobility.  No reports of pain through session.      Rehab Potential  Good    PT Frequency  2x / week    PT Duration  4 weeks    PT Treatment/Interventions  ADLs/Self Care Home Management;Aquatic Therapy;Cryotherapy;Electrical Stimulation;Moist Heat;Ultrasound;DME Instruction;Gait training;Stair training;Functional mobility training;Therapeutic activities;Therapeutic exercise;Balance training;Neuromuscular  re-education;Patient/family education;Manual techniques;Passive range of motion;Dry needling;Energy conservation;Taping    PT Next Visit Plan  continue stretching/strengthening for R obliques, core, functional, and BLE strengthening, thoracic/lumbar mobility, as well as manual STM to decrease soft tissue restrictions and pain in R obliques    PT Home Exercise Plan  eval: supine ab set; 01/24/18: bent knee raise; 7/8: seated trunk L LF and R rotation stretching; 7/10: 3D throacic excursions, LTRs       Patient will benefit from skilled therapeutic intervention in order to improve the following deficits and impairments:  Decreased activity tolerance, Decreased mobility, Decreased range of motion, Decreased strength, Increased muscle spasms, Impaired flexibility, Improper body mechanics, Postural dysfunction, Pain, Obesity  Visit Diagnosis: Other muscle spasm  Muscle weakness (generalized)  Other symptoms and signs involving the musculoskeletal system     Problem List Patient Active Problem List   Diagnosis Date Noted  . Asthma 05/14/2015  . Heel spur 02/24/2015  . Depression 02/24/2015  . Post-menopausal bleeding 06/02/2014  . OA (osteoarthritis) of knee 04/09/2014  . Epidermal cyst 10/01/2013  . Morbid obesity ([place]) 02/25/2013  . Back pain 03/04/2012  . Gout 01/25/2012  . Glucose intolerance (impaired glucose tolerance) 04/01/2011  . Edema 01/28/2011  . OBESITY HYPOVENTILATION SYNDROME 06/16/2008  . OBSTRUCTIVE SLEEP APNEA 05/08/2008  . Hyperlipidemia 08/28/2006  . Essential hypertension 08/28/2006  . GERD 08/28/2006  . Osteoarthritis 08/28/2006   Ihor Austin, [place]; [place]  Aldona Lento 02/06/2018, 11:48 AM  [facility] [address] [place], [place], [address] Phone: [phone]   Fax:  [phone]  Name: [name] MRN: [id number] Date of Birth: [birth year]

## 2018-02-08 ENCOUNTER — Ambulatory Visit (HOSPITAL_COMMUNITY): Payer: Medicare Other

## 2018-02-08 ENCOUNTER — Encounter (HOSPITAL_COMMUNITY): Payer: Self-pay

## 2018-02-08 DIAGNOSIS — R29898 Other symptoms and signs involving the musculoskeletal system: Secondary | ICD-10-CM

## 2018-02-08 DIAGNOSIS — M6281 Muscle weakness (generalized): Secondary | ICD-10-CM | POA: Diagnosis not present

## 2018-02-08 DIAGNOSIS — M62838 Other muscle spasm: Secondary | ICD-10-CM | POA: Diagnosis not present

## 2018-02-08 NOTE — Therapy (Signed)
[name], [place], [address] Phone: [phone]   Fax:  [phone]   PHYSICAL THERAPY DISCHARGE SUMMARY  Visits from Start of Care: 8  Current functional level related to goals / functional outcomes: See below   Remaining deficits: See below   Education / Equipment: Updated HEP  Plan: Patient agrees to discharge.  Patient goals were met. Patient is being discharged due to meeting the stated rehab goals.  ?????     Physical Therapy Treatment  Patient Details  Name: [name] MRN: [id number] Date of Birth: 10-25-1950 Referring Provider: Delsa Grana, PA-C   Encounter Date: 02/08/2018  PT End of Session - 02/08/18 0902    Visit Number  8    Number of Visits  9    Date for PT Re-Evaluation  02/14/18    Authorization Type  [employer]HC Medicare    Authorization Time Period  01/17/18 to 02/14/18    Authorization - Visit Number  8    Authorization - Number of Visits  10    PT Start Time  0901    PT Stop Time  0925    PT Time Calculation (min)  24 min    Activity Tolerance  Patient tolerated treatment well;No increased pain    Behavior During Therapy  WFL for tasks assessed/performed       Past Medical History:  Diagnosis Date  . Achilles tendinitis   . Anxiety   . Asthma   . Depression   . GERD (gastroesophageal reflux disease)   . Gout   . H/O myasthenia gravis    left eye  . HTN (hypertension)   . Hyperlipidemia   . Low back pain   . Obesity hypoventilation syndrome ([place])   . Obstructive sleep apnea   . Osteoarthritis     Past Surgical History:  Procedure Laterality Date  . CARDIAC CATHETERIZATION  2006  . CATARACT EXTRACTION W/PHACO Left 09/17/2015   Procedure: CATARACT EXTRACTION PHACO AND INTRAOCULAR LENS PLACEMENT LEFT EYE cde=8.58;  Surgeon: Tonny Branch, MD;  Location: [facility];  Service: Ophthalmology;  Laterality: Left;  . CATARACT EXTRACTION W/PHACO Right 05/15/2017   Procedure:  CATARACT EXTRACTION PHACO AND INTRAOCULAR LENS PLACEMENT (IOC);  Surgeon: Tonny Branch, MD;  Location: [facility];  Service: Ophthalmology;  Laterality: Right;  CDE: 6.34  . COLONOSCOPY N/A 12/25/2013   Procedure: COLONOSCOPY;  Surgeon: Rogene Houston, MD;  Location: [facility];  Service: Endoscopy;  Laterality: N/A;  730-rescheduled Ann notified pt  . CYST EXCISION N/A 09/12/2016   Procedure: EXCISION SEBACEOUS CYST, BACK;  Surgeon: Aviva Signs, MD;  Location: [facility];  Service: General;  Laterality: N/A;  . INTRAOCULAR LENS INSERTION Bilateral 2018   Dr. Tonny Branch   . TUBAL LIGATION      There were no vitals filed for this visit.  Subjective Assessment - 02/08/18 0902    Subjective  Pt reports that she is feeling a whole lot better than when she started PT. She would like to be reassessed today.     Patient Stated Goals  ease this pain    Currently in Pain?  No/denies         [facility] PT Assessment - 02/08/18 0001      Assessment   Medical Diagnosis  Acute right-sided low back pain without sciatica    Referring Provider  Delsa Grana, PA-C    Onset Date/Surgical Date  -- 2-3MA    Next MD Visit  September 2019 or earlier if needed    Prior Therapy  none      Observation/Other Assessments   Focus on Therapeutic Outcomes (FOTO)   24% limitation was 52% limited      AROM   Lumbar Flexion  WFL    Lumbar Extension  WFL    Lumbar - Right Side Bend  WFL, no pain    Lumbar - Left Side Bend  WFL    Lumbar - Right Rotation  WFL    Lumbar - Left Rotation  WFL, no pani      Strength   Right Hip Flexion  5/5 was 4+    Right Hip Extension  3/5 was 2+    Right Hip ABduction  4/5 was 3-    Left Hip Flexion  4+/5 was 4    Left Hip Extension  3+/5 was 3-    Right Knee Flexion  5/5 was 4    Right Knee Extension  5/5 was 4+    Left Knee Flexion  5/5 was 4    Left Knee Extension  5/5 was 4+      Bed Mobility   Supine to Sit  Independent    Sit to Supine  Independent            PT  Education - 02/08/18 0929    Education Details  discharge plans, updated HEP    Person(s) Educated  Patient    Methods  Explanation;Handout    Comprehension  Verbalized understanding       PT Short Term Goals - 02/08/18 0906      PT SHORT TERM GOAL #1   Title  Pt will be independent with HEP in order to decrease pain and improve function.    Time  2    Period  Weeks    Status  Achieved      PT SHORT TERM GOAL #2   Title  Pt will have 1/2 grade improvement in MMT in order to decrease stress/strain on back/side and maximize her functional mobility with less pain.    Time  2    Period  Weeks    Status  Achieved        PT Long Term Goals - 02/08/18 5537      PT LONG TERM GOAL #1   Title  Pt will report being able to transfer in/out of her car with 2/10 R side pain or < in order to demo improved function and return to PLOF.    Baseline  7/18: no pain with this anymore    Time  4    Period  Weeks    Status  Achieved      PT LONG TERM GOAL #2   Title  Pt will be mod I with bed mobility and report 2/10 R side pain or < in order to maximize her function at home and promote improved sleep.    Baseline  7/18:     Time  4    Status  Achieved      PT LONG TERM GOAL #3   Title  Pt will have WFL lumbar ROM and no pain on R side with L rotation or R lateral flexion in order to demo improved R external oblique soft tissue length and flexibility in order to maximize her functional mobility and decrease pain.    Baseline  --    Time  4    Period  Weeks    Status  Achieved  Plan - 02/08/18 0927    Clinical Impression Statement  PT reassessed pt's goals and outcome measures this date. Pt has made tremendous improvements since starting therapy. She now has no pain with car transfers, bed mobility, or lumbar ROM. Pt is ready for discharge at this time. Updated HEP to include core strenghtening and stretches to maintain progress made thus far. Pt discharged to HEP.    Rehab  Potential  Good    PT Frequency  2x / week    PT Duration  4 weeks    PT Treatment/Interventions  ADLs/Self Care Home Management;Aquatic Therapy;Cryotherapy;Electrical Stimulation;Moist Heat;Ultrasound;DME Instruction;Gait training;Stair training;Functional mobility training;Therapeutic activities;Therapeutic exercise;Balance training;Neuromuscular re-education;Patient/family education;Manual techniques;Passive range of motion;Dry needling;Energy conservation;Taping    PT Next Visit Plan  discharged to HEP    PT Home Exercise Plan  eval: supine ab set; 01/24/18: bent knee raise; 7/8: seated trunk L LF and R rotation stretching; 7/10: 3D throacic excursions, LTRs; 7/18: see below for extensive additions    Consulted and Agree with Plan of Care  Patient       Patient will benefit from skilled therapeutic intervention in order to improve the following deficits and impairments:  Decreased activity tolerance, Decreased mobility, Decreased range of motion, Decreased strength, Increased muscle spasms, Impaired flexibility, Improper body mechanics, Postural dysfunction, Pain, Obesity  Visit Diagnosis: Other muscle spasm  Muscle weakness (generalized)  Other symptoms and signs involving the musculoskeletal system     Problem List Patient Active Problem List   Diagnosis Date Noted  . Asthma 05/14/2015  . Heel spur 02/24/2015  . Depression 02/24/2015  . Post-menopausal bleeding 06/02/2014  . OA (osteoarthritis) of knee 04/09/2014  . Epidermal cyst 10/01/2013  . Morbid obesity ([place]) 02/25/2013  . Back pain 03/04/2012  . Gout 01/25/2012  . Glucose intolerance (impaired glucose tolerance) 04/01/2011  . Edema 01/28/2011  . OBESITY HYPOVENTILATION SYNDROME 06/16/2008  . OBSTRUCTIVE SLEEP APNEA 05/08/2008  . Hyperlipidemia 08/28/2006  . Essential hypertension 08/28/2006  . GERD 08/28/2006  . Osteoarthritis 08/28/2006       Geraldine Solar PT, DPT  [facility] [address] [place], [place], [address] Phone: [phone]   Fax:  [phone]  Name: [name] MRN: [id number] Date of Birth: 17-Nov-1950

## 2018-02-08 NOTE — Patient Instructions (Signed)
Access Code: [email or web address]  URL: [email or web address]  Date: 02/08/2018  Prepared by: Geraldine Solar   Exercises Supine Bridge - 10 reps - 3 sets - 1x daily - 7x weekly Supine Active Straight Leg Raise - 10 reps - 3 sets - 1x daily - 7x weekly Seated Sidebending - 10 reps - 3 sets - 1x daily - 7x weekly Standing Sidebends - 10 reps - 3 sets - 1x daily - 7x weekly TL Sidebending Stretch - Arms Overhead - 10 reps - 3 sets - 1x daily - 7x weekly Seated Trunk Rotation - Arms Crossed - 10 reps - 3 sets - 1x daily                            - 7x weekly Standing Anti-Rotation Press with Anchored Resistance - 10 reps                   - 3 sets - 1x daily - 7x weekly

## 2018-02-09 ENCOUNTER — Other Ambulatory Visit: Payer: Self-pay

## 2018-02-09 ENCOUNTER — Ambulatory Visit (INDEPENDENT_AMBULATORY_CARE_PROVIDER_SITE_OTHER): Payer: Medicare Other | Admitting: Family Medicine

## 2018-02-09 ENCOUNTER — Encounter: Payer: Self-pay | Admitting: Family Medicine

## 2018-02-09 VITALS — BP 126/86 | HR 82 | Temp 98.1°F | Resp 14 | Ht 64.5 in | Wt 284.0 lb

## 2018-02-09 DIAGNOSIS — M799 Soft tissue disorder, unspecified: Secondary | ICD-10-CM | POA: Diagnosis not present

## 2018-02-09 DIAGNOSIS — M7989 Other specified soft tissue disorders: Secondary | ICD-10-CM

## 2018-02-09 DIAGNOSIS — M545 Low back pain, unspecified: Secondary | ICD-10-CM

## 2018-02-09 DIAGNOSIS — G8929 Other chronic pain: Secondary | ICD-10-CM | POA: Diagnosis not present

## 2018-02-09 DIAGNOSIS — R609 Edema, unspecified: Secondary | ICD-10-CM

## 2018-02-09 NOTE — Assessment & Plan Note (Signed)
Chronic edema Given script for new hose last month, she has not purchased yet States she found a cheaper pair in her insurance book

## 2018-02-09 NOTE — Progress Notes (Signed)
   Subjective:    Patient ID: [name], female    DOB: 1950/12/25, 67 y.o.   MRN: [id number]  Patient presents for Knot on R Side (states that she was taking therapy and the therapist noted knot to R side under skin- states that area is painful at times and changes in size)  Pt here with sensation of the knot on her right side, states it causes pain on and off, sometimes bigger than others, Not sure how long it has been present, states her Physical therapist told her she felt it as well. She has not seen anything on the skin States her back has been okay, they worked on ROM, strengthening as well   Review Of Systems:  GEN- denies fatigue, fever, weight loss,weakness, recent illness HEENT- denies eye drainage, change in vision, nasal discharge, CVS- denies chest pain, palpitations RESP- denies SOB, cough, wheeze ABD- denies N/V, change in stools, abd pain GU- denies dysuria, hematuria, dribbling, incontinence MSK- denies joint pain, muscle aches, injury Neuro- denies headache, dizziness, syncope, seizure activity       Objective:    BP 126/86   Pulse 82   Temp 98.1 F (36.7 C) (Oral)   Resp 14   Ht 5' 4.5" (1.638 m)   Wt 284 lb (128.8 kg)   SpO2 95%   BMI 48.00 kg/m  GEN- NAD, alert and oriented x3 HEENT- PERRL, EOMI, non injected sclera, pink conjunctiva, MMM, oropharynx clear CVS- RRR, no murmur RESP-CTAB ABD-NABS,soft,NT,ND MSK- tender spot on right side, below the ribs, very vague soft tissue knot, mild TTP EXT- non pitting edema Pulses- Radial 2+        Assessment & Plan:      Problem List Items Addressed This Visit      Unprioritized   Back pain    Other Visit Diagnoses    Soft tissue mass    -  Primary   very difficult to appreciate a specific mass, but has a tender spot, will obtain soft tissue US. this is different from her chronic back pain.Her habitus makes it difficult to feel or see       Note: This dictation was prepared with Dragon  dictation along with smaller phrase technology. Any transcriptional errors that result from this process are unintentional.

## 2018-02-09 NOTE — Patient Instructions (Addendum)
Ultrasound to be done Get the compression hose  F/U as previous

## 2018-02-12 ENCOUNTER — Other Ambulatory Visit: Payer: Self-pay | Admitting: Family Medicine

## 2018-02-12 DIAGNOSIS — M7989 Other specified soft tissue disorders: Secondary | ICD-10-CM

## 2018-02-14 ENCOUNTER — Ambulatory Visit (INDEPENDENT_AMBULATORY_CARE_PROVIDER_SITE_OTHER): Payer: Medicare Other | Admitting: Obstetrics & Gynecology

## 2018-02-14 ENCOUNTER — Other Ambulatory Visit: Payer: Self-pay | Admitting: Obstetrics & Gynecology

## 2018-02-14 ENCOUNTER — Ambulatory Visit: Payer: Medicare Other

## 2018-02-14 ENCOUNTER — Other Ambulatory Visit: Payer: Self-pay | Admitting: Family Medicine

## 2018-02-14 DIAGNOSIS — D251 Intramural leiomyoma of uterus: Secondary | ICD-10-CM

## 2018-02-14 DIAGNOSIS — N950 Postmenopausal bleeding: Secondary | ICD-10-CM

## 2018-02-14 DIAGNOSIS — D219 Benign neoplasm of connective and other soft tissue, unspecified: Secondary | ICD-10-CM

## 2018-02-14 NOTE — Progress Notes (Signed)
    [name] 01/12/1951 [id number]        67 y.o.  G3P3003   RP: PMB for Pelvic US  HPI: Had vaginal spotting after 3 years on Megace for PMB.  Stopped Megace on my recommendation 12/28/2017.  No PMB since then.  No pelvic pain.  EBx benign on 12/28/2017.   OB History  Gravida Para Term Preterm AB Living  3 3 3     3   SAB TAB Ectopic Multiple Live Births               # Outcome Date GA Lbr Len/2nd Weight Sex Delivery Anes PTL Lv  3 Term           2 Term           1 Term             Past medical history,surgical history, problem list, medications, allergies, family history and social history were all reviewed and documented in the EPIC chart.   Directed ROS with pertinent positives and negatives documented in the history of present illness/assessment and plan.  Exam:  There were no vitals filed for this visit. General appearance:  Normal  Endometrial Bx Patho: Benign underdeveloped secretory-type endometrium, consistent with the effects of exogenous hormone therapy.  Hyperplasia, atypia, or malignancy is not identified.   Pelvic US today: T/V and T/A images.  Anteverted uterus measuring 9.06 x 6.55 x 4.95 cm.  Intramural fibroids: Calcified measuring 2.5 x 2.4 cm, solid measuring 3.1 x 2.7 cm, displacing the endometrium measuring 4.2 x 3.8 cm.  Endometrial line normal at 4.3 mm.  Right and left ovary not seen.  No free fluid in the posterior cul-de-sac.   Assessment/Plan:  67 y.o. G3P3003   1. Postmenopausal bleeding Resolved postmenopausal bleeding since stopping Megace on June 6th 2019.  Endometrial biopsy benign.  Endometrial line normal and thin at 4.3 mm.  Ultrasound findings reviewed with patient.  Patient reassured.  2. Fibroids Uterine fibroids of benign appearance.  Discussed with patient.  Will observe.  F/U Annual/gyn visit in 1 year.  Counseling on above issues >50% x 15 minutes  Princess Bruins MD, 3:06 PM 02/14/2018

## 2018-02-15 ENCOUNTER — Ambulatory Visit (HOSPITAL_COMMUNITY): Payer: Medicare Other

## 2018-02-18 ENCOUNTER — Encounter: Payer: Self-pay | Admitting: Obstetrics & Gynecology

## 2018-02-18 NOTE — Patient Instructions (Signed)
1. Postmenopausal bleeding Resolved postmenopausal bleeding since stopping Megace on June 6th 2019.  Endometrial biopsy benign.  Endometrial line normal and thin at 4.3 mm.  Ultrasound findings reviewed with patient.  Patient reassured.  2. Fibroids Uterine fibroids of benign appearance.  Discussed with patient.  Will observe.  F/U Annual/gyn visit in 1 year.  Sun, it was a pleasure seeing you today!

## 2018-02-19 DIAGNOSIS — J449 Chronic obstructive pulmonary disease, unspecified: Secondary | ICD-10-CM | POA: Diagnosis not present

## 2018-02-20 ENCOUNTER — Ambulatory Visit (HOSPITAL_COMMUNITY)
Admission: RE | Admit: 2018-02-20 | Discharge: 2018-02-20 | Disposition: A | Discharge status: Home / Self Care | Payer: Medicare Other | Source: Ambulatory Visit | Attending: Family Medicine | Admitting: Family Medicine

## 2018-02-20 DIAGNOSIS — M799 Soft tissue disorder, unspecified: Secondary | ICD-10-CM | POA: Diagnosis not present

## 2018-02-20 DIAGNOSIS — M7989 Other specified soft tissue disorders: Secondary | ICD-10-CM

## 2018-02-20 DIAGNOSIS — R1900 Intra-abdominal and pelvic swelling, mass and lump, unspecified site: Secondary | ICD-10-CM | POA: Diagnosis not present

## 2018-03-08 ENCOUNTER — Other Ambulatory Visit: Payer: Self-pay | Admitting: Family Medicine

## 2018-03-08 NOTE — Telephone Encounter (Signed)
Pt needs refill on hydrocodone to [facility] [address]

## 2018-03-08 NOTE — Telephone Encounter (Signed)
Ok to refill??  Last office visit 02/09/2018.  Last refill 02/05/2018.

## 2018-03-09 MED ORDER — HYDROCODONE-ACETAMINOPHEN 7.5-325 MG PO TABS: 1.0000 | ORAL_TABLET | Freq: Two times a day (BID) | ORAL | 0 refills | Status: DC | PRN

## 2018-03-16 ENCOUNTER — Other Ambulatory Visit: Payer: Self-pay | Admitting: Family Medicine

## 2018-03-22 DIAGNOSIS — J449 Chronic obstructive pulmonary disease, unspecified: Secondary | ICD-10-CM | POA: Diagnosis not present

## 2018-04-02 ENCOUNTER — Other Ambulatory Visit: Payer: Self-pay | Admitting: Family Medicine

## 2018-04-06 ENCOUNTER — Other Ambulatory Visit: Payer: Self-pay | Admitting: Family Medicine

## 2018-04-09 ENCOUNTER — Other Ambulatory Visit: Payer: Self-pay | Admitting: Family Medicine

## 2018-04-09 MED ORDER — HYDROCODONE-ACETAMINOPHEN 7.5-325 MG PO TABS: 1.0000 | ORAL_TABLET | Freq: Two times a day (BID) | ORAL | 0 refills | Status: DC | PRN

## 2018-04-09 NOTE — Telephone Encounter (Signed)
Ok to refill??  Last office visit 02/09/2018.  Last refill 03/09/2018.

## 2018-04-09 NOTE — Telephone Encounter (Signed)
Patient calling to get refill on her hydrocodone  [facility] [place]

## 2018-04-10 ENCOUNTER — Other Ambulatory Visit: Payer: Self-pay | Admitting: Family Medicine

## 2018-04-12 ENCOUNTER — Ambulatory Visit (INDEPENDENT_AMBULATORY_CARE_PROVIDER_SITE_OTHER): Payer: Medicare Other | Admitting: Physician Assistant

## 2018-04-12 ENCOUNTER — Encounter: Payer: Self-pay | Admitting: Physician Assistant

## 2018-04-12 VITALS — BP 128/84 | HR 87 | Temp 97.7°F | Resp 18 | Ht 65.0 in | Wt 275.0 lb

## 2018-04-12 DIAGNOSIS — K219 Gastro-esophageal reflux disease without esophagitis: Secondary | ICD-10-CM

## 2018-04-12 DIAGNOSIS — Z23 Encounter for immunization: Secondary | ICD-10-CM

## 2018-04-12 DIAGNOSIS — G4733 Obstructive sleep apnea (adult) (pediatric): Secondary | ICD-10-CM | POA: Diagnosis not present

## 2018-04-12 DIAGNOSIS — I10 Essential (primary) hypertension: Secondary | ICD-10-CM | POA: Diagnosis not present

## 2018-04-12 DIAGNOSIS — D171 Benign lipomatous neoplasm of skin and subcutaneous tissue of trunk: Secondary | ICD-10-CM

## 2018-04-12 DIAGNOSIS — E785 Hyperlipidemia, unspecified: Secondary | ICD-10-CM | POA: Diagnosis not present

## 2018-04-12 NOTE — Progress Notes (Signed)
Patient ID: [name] MRN: [id number], DOB: 01-Aug-1950, 67 y.o. Date of Encounter: 04/12/2018, 10:11 AM    Chief Complaint:  Chief Complaint  Patient presents with  . Hyperlipidemia  . Flu Vaccine     HPI: 67 y.o. year old female presents for f/u OV.  She usually sees Dr. Buelah [name] but Dr. Buelah [name] is currently out on maternity leave so [name] is scheduled with me today and then will return to care of Dr. Buelah [name].  [name] states that the only specific thing she wanted to discuss was says that she could not really understand what they said about her ultrasound test when they called her. She has no other specific concerns to address today.  I reviewed note from 02/09/2018.  That visit patient had reported that she had noticed a "knot on her side "and that when she was at therapy the therapist had also noticed that it was present.  At her OV ultrasound was ordered.  Reviewed ultrasound report.  Sound showed that the "mass" was collection of fat cells. I discussed this with patient today and explained findings.  Also reviewed that she had full set of labs 12/06/2017.  Labs were all good at that time.  CBC and c-Met were all normal.  LDL was 112.  A1c was normal.     Home Meds:   Outpatient Medications Prior to Visit  Medication Sig Dispense Refill  . acetaminophen (TYLENOL) 500 MG tablet Take 1,000 mg by mouth every 6 (six) hours as needed for mild pain.     Marland Kitchen albuterol (PROVENTIL HFA;VENTOLIN HFA) 108 (90 Base) MCG/ACT inhaler Inhale 2 puffs into the lungs every 4 (four) hours as needed for wheezing or shortness of breath. 1 Inhaler 5  . allopurinol (ZYLOPRIM) 100 MG tablet TAKE 1 TABLET BY MOUTH DAILY 90 tablet 0  . aspirin EC 81 MG tablet Take 81 mg by mouth every evening.     Marland Kitchen atorvastatin (LIPITOR) 10 MG tablet TAKE 1 TABLET(10 MG) BY MOUTH DAILY IN THE MORNING 90 tablet 0  . diphenhydrAMINE (BENADRYL) 25 MG tablet Take 25 mg by mouth every morning.     .  fluticasone (CUTIVATE) 0.05 % cream APPLY EXTERNALLY TO THE AFFECTED AREA DAILY 30 g 3  . furosemide (LASIX) 20 MG tablet TAKE 1 TABLET(20 MG) BY MOUTH DAILY FOR SWELLING 90 tablet 0  . HYDROcodone-acetaminophen (NORCO) 7.5-325 MG tablet Take 1 tablet by mouth 2 (two) times daily as needed for moderate pain. 60 tablet 0  . losartan (COZAAR) 50 MG tablet TAKE 1 TABLET BY MOUTH DAILY 90 tablet 0  . OXYGEN Inhale 1 L into the lungs at bedtime. IN ADDITION TO CPAP NIGHTLY    . pyridostigmine (MESTINON) 60 MG tablet Take 1 tablet (60 mg total) by mouth 3 (three) times daily. 270 tablet 4  . ranitidine (ZANTAC) 150 MG tablet TAKE 1 TABLET BY MOUTH EVERY NIGHT AT BEDTIME 90 tablet 0  . traZODone (DESYREL) 50 MG tablet TAKE 1/2 TO 1 TABLET(25 TO 50 MG) BY MOUTH AT BEDTIME AS NEEDED FOR SLEEP 90 tablet 0  . methocarbamol (ROBAXIN) 500 MG tablet Take 1 tablet (500 mg total) by mouth every 8 (eight) hours as needed for muscle spasms. 30 tablet 0   No facility-administered medications prior to visit.     Allergies: No Known Allergies    Review of Systems: See HPI for pertinent ROS. All other ROS negative.    Physical Exam: Blood pressure 128/84, pulse  87, temperature 97.7 F (36.5 C), temperature source Oral, resp. rate 18, height 5' 5"  (1.651 m), weight 124.7 kg, SpO2 98 %., Body mass index is 45.76 kg/m. General:  Obese AAF. Appears in no acute distress. Neck: Supple. No thyromegaly. No lymphadenopathy. Lungs: Clear bilaterally to auscultation without wheezes, rales, or rhonchi. Breathing is unlabored. Heart: Regular rhythm. No murmurs, rubs, or gallops. Abdomen: Soft, non-tender, non-distended with normoactive bowel sounds. No hepatomegaly. No rebound/guarding. No obvious abdominal masses. Msk:  Strength and tone normal for age. Extremities/Skin: Warm and dry.  No edema.  Neuro: Alert and oriented X 3. Moves all extremities spontaneously. Gait is normal. CNII-XII grossly in tact. Psych:   Responds to questions appropriately with a normal affect.     ASSESSMENT AND PLAN:  67 y.o. year old female with   1. Essential hypertension Blood pressure is at goal, controlled.  Continue current medications.  CME T was normal/stable 12/06/2017.  Can wait to repeat this lab.  2. Hyperlipidemia, unspecified hyperlipidemia type LDL was 112 on labs 12/06/2017.  LFTs were normal.  Continue current dose of Lipitor.  Can wait 6 months between repeat labs.  3. Lipoma  I reviewed OV note 02/09/2018 today.  Reviewed findings of ultrasound test with patient today.  Reassured her that the "mass" that she was feeling as collection of fat cells.  Have her return for routine visits with Dr. Buelah [name].  Follow-up sooner if needed.   Signed, [address] [place], [place], [facility] 04/12/2018 10:11 AM

## 2018-04-22 DIAGNOSIS — J449 Chronic obstructive pulmonary disease, unspecified: Secondary | ICD-10-CM | POA: Diagnosis not present

## 2018-05-08 ENCOUNTER — Other Ambulatory Visit: Payer: Self-pay | Admitting: Family Medicine

## 2018-05-08 MED ORDER — HYDROCODONE-ACETAMINOPHEN 7.5-325 MG PO TABS: 1.0000 | ORAL_TABLET | Freq: Two times a day (BID) | ORAL | 0 refills | Status: DC | PRN

## 2018-05-08 NOTE — Telephone Encounter (Signed)
Patient is calling to get refill on her hydrocodone  To be called into  [facility] [place]

## 2018-05-08 NOTE — Telephone Encounter (Signed)
Ok to refill??  Last office visit 04/12/2018.  Last refill 04/09/2018.

## 2018-05-16 ENCOUNTER — Other Ambulatory Visit: Payer: Self-pay | Admitting: Family Medicine

## 2018-05-22 DIAGNOSIS — J449 Chronic obstructive pulmonary disease, unspecified: Secondary | ICD-10-CM | POA: Diagnosis not present

## 2018-06-06 ENCOUNTER — Other Ambulatory Visit: Payer: Self-pay | Admitting: Family Medicine

## 2018-06-07 ENCOUNTER — Other Ambulatory Visit: Payer: Self-pay | Admitting: Family Medicine

## 2018-06-07 MED ORDER — HYDROCODONE-ACETAMINOPHEN 7.5-325 MG PO TABS: 1.0000 | ORAL_TABLET | Freq: Two times a day (BID) | ORAL | 0 refills | Status: DC | PRN

## 2018-06-07 NOTE — Telephone Encounter (Signed)
Patient needs refill on her hydrocodone  [facility] [place]

## 2018-06-07 NOTE — Telephone Encounter (Signed)
Ok to refill??  Last office visit 04/12/2018.  Last refill 05/08/2018.

## 2018-06-18 ENCOUNTER — Other Ambulatory Visit: Payer: Self-pay | Admitting: Family Medicine

## 2018-06-22 DIAGNOSIS — J449 Chronic obstructive pulmonary disease, unspecified: Secondary | ICD-10-CM | POA: Diagnosis not present

## 2018-06-29 ENCOUNTER — Other Ambulatory Visit: Payer: Self-pay | Admitting: Family Medicine

## 2018-07-04 ENCOUNTER — Other Ambulatory Visit: Payer: Self-pay | Admitting: Family Medicine

## 2018-07-05 ENCOUNTER — Other Ambulatory Visit: Payer: Self-pay | Admitting: Family Medicine

## 2018-07-05 NOTE — Telephone Encounter (Signed)
Ok to refill??  Last office visit 04/12/2018.  Last refill 06/07/2018.

## 2018-07-05 NOTE — Telephone Encounter (Signed)
Refill on hydrocodone to [facility] [address]

## 2018-07-06 MED ORDER — HYDROCODONE-ACETAMINOPHEN 7.5-325 MG PO TABS: 1.0000 | ORAL_TABLET | Freq: Two times a day (BID) | ORAL | 0 refills | Status: DC | PRN

## 2018-07-10 ENCOUNTER — Other Ambulatory Visit: Payer: Self-pay | Admitting: Family Medicine

## 2018-07-22 DIAGNOSIS — J449 Chronic obstructive pulmonary disease, unspecified: Secondary | ICD-10-CM | POA: Diagnosis not present

## 2018-07-30 ENCOUNTER — Other Ambulatory Visit: Payer: Self-pay | Admitting: Diagnostic Neuroimaging

## 2018-08-07 ENCOUNTER — Other Ambulatory Visit: Payer: Self-pay | Admitting: Family Medicine

## 2018-08-07 MED ORDER — HYDROCODONE-ACETAMINOPHEN 7.5-325 MG PO TABS: 1.0000 | ORAL_TABLET | Freq: Two times a day (BID) | ORAL | 0 refills | Status: DC | PRN

## 2018-08-07 NOTE — Telephone Encounter (Signed)
Ok to refill??  Last office visit 04/12/2018.  Last refill 07/06/2018.

## 2018-08-07 NOTE — Telephone Encounter (Signed)
Patient needs refill on her hydrocodone  [facility] [place]

## 2018-08-13 ENCOUNTER — Ambulatory Visit (INDEPENDENT_AMBULATORY_CARE_PROVIDER_SITE_OTHER): Payer: Medicare Other | Admitting: Family Medicine

## 2018-08-13 ENCOUNTER — Encounter: Payer: Self-pay | Admitting: Family Medicine

## 2018-08-13 ENCOUNTER — Other Ambulatory Visit: Payer: Self-pay

## 2018-08-13 VITALS — BP 128/78 | HR 80 | Temp 98.2°F | Resp 16 | Ht 65.0 in | Wt 277.0 lb

## 2018-08-13 DIAGNOSIS — R252 Cramp and spasm: Secondary | ICD-10-CM | POA: Diagnosis not present

## 2018-08-13 DIAGNOSIS — G4733 Obstructive sleep apnea (adult) (pediatric): Secondary | ICD-10-CM

## 2018-08-13 DIAGNOSIS — D171 Benign lipomatous neoplasm of skin and subcutaneous tissue of trunk: Secondary | ICD-10-CM

## 2018-08-13 DIAGNOSIS — L853 Xerosis cutis: Secondary | ICD-10-CM

## 2018-08-13 DIAGNOSIS — M1711 Unilateral primary osteoarthritis, right knee: Secondary | ICD-10-CM

## 2018-08-13 DIAGNOSIS — E785 Hyperlipidemia, unspecified: Secondary | ICD-10-CM | POA: Diagnosis not present

## 2018-08-13 DIAGNOSIS — I10 Essential (primary) hypertension: Secondary | ICD-10-CM

## 2018-08-13 DIAGNOSIS — J4520 Mild intermittent asthma, uncomplicated: Secondary | ICD-10-CM

## 2018-08-13 MED ORDER — FLUTICASONE PROPIONATE 0.05 % EX CREA: TOPICAL_CREAM | CUTANEOUS | 3 refills | Status: DC

## 2018-08-13 NOTE — Assessment & Plan Note (Signed)
Continue norco for chronic pain

## 2018-08-13 NOTE — Assessment & Plan Note (Signed)
Controlled no changes 

## 2018-08-13 NOTE — Patient Instructions (Addendum)
Use Aveeno or Cetaphil lotion for face Referral to general surgeon  Referral to pulmonary for sleep apnea follow up F/U 4 months for Wellness

## 2018-08-13 NOTE — Progress Notes (Signed)
   Subjective:    Patient ID: [name], female    DOB: Jan 22, 1951, 68 y.o.   MRN: [id number]  Patient presents for Follow-up (is not fasting); Side Pain (states that she continues to have pain at soft tissue mass site); and Medication Management (wouldlike to change cream for face)   Fatty tumor on right side of abdmoen , had [place] cintnues to cause some discomfort    Has dry spots on face, uses cutivate per dermatology for spot treatment   Peripheral edema- taking lasix daily    Hyperlipidemia- taking lipitor, last LDL 112 in May    HTN- taking losartan   Has cramps in legs and feet that have been getting worse    Insomnia taking trazodone at bedtime    Chronic pain- taking norco   Needs new lung doctor for her CPAP   Review Of Systems:  GEN- denies fatigue, fever, weight loss,weakness, recent illness HEENT- denies eye drainage, change in vision, nasal discharge, CVS- denies chest pain, palpitations RESP- denies SOB, cough, wheeze ABD- denies N/V, change in stools, abd pain GU- denies dysuria, hematuria, dribbling, incontinence MSK- +joint pain, muscle aches, injury Neuro- denies headache, dizziness, syncope, seizure activity       Objective:    BP 128/78   Pulse 80   Temp 98.2 F (36.8 C) (Oral)   Resp 16   Ht 5\' 5"  (1.651 m)   Wt 277 lb (125.6 kg)   SpO2 95%   BMI 46.10 kg/m  GEN- NAD, alert and oriented x3 HEENT- PERRL, EOMI, non injected sclera, pink conjunctiva, MMM, oropharynx clear Neck- Supple, no thyromegaly CVS- RRR, no murmur RESP-CTAB ABD-NABS,soft,ND, below the ribs right side, very vague soft tissue knot, mild TTP Skin- dry skin on face, arms, legs/feet  EXT- No edema Pulses- Radial, DP- 2+        Assessment & Plan:      Problem List Items Addressed This Visit      Unprioritized   Asthma    Currently compensated      Relevant Orders   Ambulatory referral to Pulmonology   Essential hypertension    Controlled no changes       Relevant Orders   CBC with Differential/Platelet   Comprehensive metabolic panel   Hyperlipidemia    Check lipids, on statin drug      Relevant Orders   Lipid panel   Morbid obesity (HCC)   OA (osteoarthritis) of knee    Continue norco for chronic pain      OBSTRUCTIVE SLEEP APNEA    Referral back to pulmonary for CPAP management      Relevant Orders   Ambulatory referral to Pulmonology    Other Visit Diagnoses    Lipoma of abdominal wall    -  Primary   subtle on exam but bothersome [place], more consistent with fatty tumor   Relevant Orders   Ambulatory referral to General Surgery   Dry skin dermatitis       advised to use lotion, moisturizer   Leg cramps       Relevant Orders   Magnesium      Note: This dictation was prepared with Dragon dictation along with smaller phrase technology. Any transcriptional errors that result from this process are unintentional.

## 2018-08-13 NOTE — Assessment & Plan Note (Signed)
Currently compensated 

## 2018-08-13 NOTE — Assessment & Plan Note (Signed)
Referral back to pulmonary for CPAP management

## 2018-08-13 NOTE — Assessment & Plan Note (Signed)
Check lipids, on statin drug

## 2018-08-14 LAB — CBC WITH DIFFERENTIAL/PLATELET
Absolute Monocytes: 702 cells/uL (ref 200–950)
Basophils Absolute: 72 cells/uL (ref 0–200)
Basophils Relative: 1.2 %
Eosinophils Absolute: 312 cells/uL (ref 15–500)
Eosinophils Relative: 5.2 %
HCT: 39.3 % (ref 35.0–45.0)
Hemoglobin: 13 g/dL (ref 11.7–15.5)
Lymphs Abs: 1848 cells/uL (ref 850–3900)
MCH: 29.8 pg (ref 27.0–33.0)
MCHC: 33.1 g/dL (ref 32.0–36.0)
MCV: 90.1 fL (ref 80.0–100.0)
MPV: 10.3 fL (ref 7.5–12.5)
Monocytes Relative: 11.7 %
Neutro Abs: 3066 cells/uL (ref 1500–7800)
Neutrophils Relative %: 51.1 %
PLATELETS: 264 10*3/uL (ref 140–400)
RBC: 4.36 10*6/uL (ref 3.80–5.10)
RDW: 13.2 % (ref 11.0–15.0)
Total Lymphocyte: 30.8 %
WBC: 6 10*3/uL (ref 3.8–10.8)

## 2018-08-14 LAB — LIPID PANEL
CHOLESTEROL: 187 mg/dL (ref <200)
HDL: 50 mg/dL — AB (ref >50)
LDL Cholesterol (Calc): 103 mg/dL (calc) — ABNORMAL HIGH
Non-HDL Cholesterol (Calc): 137 mg/dL (calc) — ABNORMAL HIGH (ref <130)
Total CHOL/HDL Ratio: 3.7 (calc) (ref <5.0)
Triglycerides: 222 mg/dL — ABNORMAL HIGH (ref <150)

## 2018-08-14 LAB — COMPREHENSIVE METABOLIC PANEL
AG Ratio: 1.3 (calc) (ref 1.0–2.5)
ALKALINE PHOSPHATASE (APISO): 81 U/L (ref 33–130)
ALT: 11 U/L (ref 6–29)
AST: 13 U/L (ref 10–35)
Albumin: 3.8 g/dL (ref 3.6–5.1)
BILIRUBIN TOTAL: 0.5 mg/dL (ref 0.2–1.2)
BUN/Creatinine Ratio: 12 (calc) (ref 6–22)
BUN: 13 mg/dL (ref 7–25)
CALCIUM: 9 mg/dL (ref 8.6–10.4)
CO2: 30 mmol/L (ref 20–32)
Chloride: 104 mmol/L (ref 98–110)
Creat: 1.06 mg/dL — ABNORMAL HIGH (ref 0.50–0.99)
Globulin: 3 g/dL (calc) (ref 1.9–3.7)
Glucose, Bld: 100 mg/dL — ABNORMAL HIGH (ref 65–99)
Potassium: 4.6 mmol/L (ref 3.5–5.3)
Sodium: 139 mmol/L (ref 135–146)
Total Protein: 6.8 g/dL (ref 6.1–8.1)

## 2018-08-14 LAB — MAGNESIUM: Magnesium: 1.8 mg/dL (ref 1.5–2.5)

## 2018-08-16 ENCOUNTER — Other Ambulatory Visit: Payer: Self-pay | Admitting: Family Medicine

## 2018-08-16 ENCOUNTER — Other Ambulatory Visit: Payer: Self-pay | Admitting: *Deleted

## 2018-08-16 MED ORDER — MAGNESIUM 250 MG PO TABS: 1.0000 | ORAL_TABLET | Freq: Every day | ORAL | 11 refills | Status: AC

## 2018-08-17 ENCOUNTER — Telehealth: Payer: Self-pay | Admitting: *Deleted

## 2018-08-17 NOTE — Telephone Encounter (Signed)
Call paced to pharmacy.   Was advised that since medication is OTC, patient can pick up. Advised that patient is requesting assistance to pick correct medication. States that she can go to pharmacy and they will help her pick it up.   Reports that only dosage of Mag that is prescription is 400mg .   Call placed to patient and patient made aware. Patient notably upset that pharmacy can't put medication in a prescription bottle. Advised that the prescription magnesium is a different dose.

## 2018-08-17 NOTE — Telephone Encounter (Signed)
-----   Message from Roney Marion sent at 08/17/2018  2:04 PM EST ----- Regarding: medication Patient called states that [facility] on [address] hasn't received her magnesium I advised patient that it was called in but she states they don't have it.  Please call pharmacy.  Thank you, Larene Beach

## 2018-08-22 DIAGNOSIS — J449 Chronic obstructive pulmonary disease, unspecified: Secondary | ICD-10-CM | POA: Diagnosis not present

## 2018-08-23 ENCOUNTER — Encounter: Payer: Self-pay | Admitting: General Surgery

## 2018-08-23 ENCOUNTER — Ambulatory Visit (INDEPENDENT_AMBULATORY_CARE_PROVIDER_SITE_OTHER): Payer: Medicare Other | Admitting: General Surgery

## 2018-08-23 VITALS — BP 152/72 | HR 78 | Temp 97.7°F | Resp 20 | Wt 277.0 lb

## 2018-08-23 DIAGNOSIS — D171 Benign lipomatous neoplasm of skin and subcutaneous tissue of trunk: Secondary | ICD-10-CM | POA: Diagnosis not present

## 2018-08-23 NOTE — Progress Notes (Signed)
[name]; [id number]; 1951-03-17   HPI Patient is a 68 year old black female who was referred to my care by Dr. Buelah Manis for evaluation treatment of abdominal wall lipoma.  Patient states she is more concerned about a lump that developed in her back on the right side.  She states that area was tender and physical therapy noticed a lump in this region.  She thought it was a cyst which has since resolved.  She has residual discomfort in that area.  She denies any nausea or vomiting.  She denies any significant discomfort in the right flank area.  She has not had surgery in that area.  I have removed a sebaceous cyst on her back in the past. Past Medical History:  Diagnosis Date  . Achilles tendinitis   . Anxiety   . Asthma   . Depression   . GERD (gastroesophageal reflux disease)   . Gout   . H/O myasthenia gravis    left eye  . HTN (hypertension)   . Hyperlipidemia   . Low back pain   . Obesity hypoventilation syndrome ([place])   . Obstructive sleep apnea   . Osteoarthritis     Past Surgical History:  Procedure Laterality Date  . CARDIAC CATHETERIZATION  2006  . CATARACT EXTRACTION W/PHACO Left 09/17/2015   Procedure: CATARACT EXTRACTION PHACO AND INTRAOCULAR LENS PLACEMENT LEFT EYE cde=8.58;  Surgeon: Tonny Branch, MD;  Location: [facility];  Service: Ophthalmology;  Laterality: Left;  . CATARACT EXTRACTION W/PHACO Right 05/15/2017   Procedure: CATARACT EXTRACTION PHACO AND INTRAOCULAR LENS PLACEMENT (IOC);  Surgeon: Tonny Branch, MD;  Location: [facility];  Service: Ophthalmology;  Laterality: Right;  CDE: 6.34  . COLONOSCOPY N/A 12/25/2013   Procedure: COLONOSCOPY;  Surgeon: Rogene Houston, MD;  Location: [facility];  Service: Endoscopy;  Laterality: N/A;  730-rescheduled Ann notified pt  . CYST EXCISION N/A 09/12/2016   Procedure: EXCISION SEBACEOUS CYST, BACK;  Surgeon: Aviva Signs, MD;  Location: [facility];  Service: General;  Laterality: N/A;  . INTRAOCULAR LENS INSERTION Bilateral 2018    Dr. Tonny Branch   . TUBAL LIGATION      Family History  Problem Relation Age of Onset  . Heart disease Mother   . Thyroid disease Mother   . Heart failure Father   . Lung disease Father   . Diabetes Brother   . Crohn's disease Daughter     Current Outpatient Medications on File Prior to Visit  Medication Sig Dispense Refill  . acetaminophen (TYLENOL) 500 MG tablet Take 1,000 mg by mouth every 6 (six) hours as needed for mild pain.     Marland Kitchen albuterol (PROVENTIL HFA;VENTOLIN HFA) 108 (90 Base) MCG/ACT inhaler Inhale 2 puffs into the lungs every 4 (four) hours as needed for wheezing or shortness of breath. 1 Inhaler 5  . allopurinol (ZYLOPRIM) 100 MG tablet TAKE 1 TABLET BY MOUTH DAILY 90 tablet 0  . aspirin EC 81 MG tablet Take 81 mg by mouth every evening.     Marland Kitchen atorvastatin (LIPITOR) 10 MG tablet TAKE 1 TABLET(10 MG) BY MOUTH DAILY IN THE MORNING 90 tablet 0  . diphenhydrAMINE (BENADRYL) 25 MG tablet Take 25 mg by mouth every morning.     . fluticasone (CUTIVATE) 0.05 % cream APPLY EXTERNALLY TO THE AFFECTED AREA DAILY 30 g 3  . furosemide (LASIX) 20 MG tablet TAKE 1 TABLET(20 MG) BY MOUTH DAILY FOR SWELLING 90 tablet 0  . HYDROcodone-acetaminophen (NORCO) 7.5-325 MG tablet Take 1 tablet  by mouth 2 (two) times daily as needed for moderate pain. 60 tablet 0  . losartan (COZAAR) 50 MG tablet TAKE 1 TABLET BY MOUTH DAILY 90 tablet 0  . Magnesium 250 MG TABS Take 1 tablet (250 mg total) by mouth daily. 30 tablet 11  . OXYGEN Inhale 1 L into the lungs at bedtime. IN ADDITION TO CPAP NIGHTLY    . pyridostigmine (MESTINON) 60 MG tablet Take 1 tablet (60 mg total) by mouth 3 (three) times daily. 270 tablet 4  . ranitidine (ZANTAC) 150 MG tablet TAKE 1 TABLET BY MOUTH EVERY NIGHT AT BEDTIME 90 tablet 0  . traZODone (DESYREL) 50 MG tablet TAKE 1/2 TO 1 TABLET(25 TO 50 MG) BY MOUTH AT BEDTIME AS NEEDED FOR SLEEP 90 tablet 0   No current facility-administered medications on file prior to visit.      No Known Allergies  Social History   Substance and Sexual Activity  Alcohol Use No  . Alcohol/week: 0.0 standard drinks    Social History   Tobacco Use  Smoking Status Former Smoker  . Packs/day: 2.00  . Years: 36.00  . Pack years: 72.00  . Types: Cigarettes  . Start date: 06/25/1969  . Last attempt to quit: 03/25/2005  . Years since quitting: 13.4  Smokeless Tobacco Never Used    Review of Systems  Constitutional: Positive for malaise/fatigue.  HENT: Negative.   Eyes: Positive for blurred vision.  Respiratory: Negative.   Cardiovascular: Negative.   Gastrointestinal: Negative.   Genitourinary: Negative.   Musculoskeletal: Positive for back pain and neck pain.  Skin: Negative.   Neurological: Negative.   Endo/Heme/Allergies: Negative.   Psychiatric/Behavioral: Negative.     Objective   Vitals:   08/23/18 1100  BP: (!) 152/72  Pulse: 78  Resp: 20  Temp: 97.7 F (36.5 C)    Physical Exam Vitals signs reviewed.  Constitutional:      Appearance: Normal appearance. She is obese. She is not ill-appearing.  HENT:     Head: Normocephalic and atraumatic.  Cardiovascular:     Rate and Rhythm: Normal rate and regular rhythm.     Heart sounds: Murmur present. No friction rub. No gallop.      Comments: 2 out of 6 systolic ejection murmur Pulmonary:     Effort: Pulmonary effort is normal. No respiratory distress.     Breath sounds: Normal breath sounds. No stridor. No wheezing, rhonchi or rales.  Abdominal:     General: Abdomen is flat. Bowel sounds are normal. There is no distension.     Palpations: Abdomen is soft. There is mass.     Tenderness: There is no abdominal tenderness. There is no guarding or rebound.     Hernia: No hernia is present.     Comments: Large oblong subcutaneous mass present in the right flank region.  I could not appreciate a hernia, although this is difficult due to her body habitus.  It was nontender.  Patient has a significant  pannus.  No rigidity is noted.  Patient was difficult to examine as she could not remain supine.  Skin:    General: Skin is warm and dry.     Comments: There is a significant amount of adipose tissue along the posterior costal margin bilaterally.  I could not appreciate a specific knot or cyst that she states was present in the past.  She does have some tenderness on the chest wall posteriorly on the right side.  This is somewhat limited to  just palpation.  Neurological:     Mental Status: She is alert and oriented to person, place, and time.    Ultrasound report reviewed Assessment  Large benign mass on abdominal wall, consistent with lipoma.  Asymptomatic. Resolved sebaceous cyst, back Plan   No need for surgical intervention concerning the lipomatous mass of the right flank at this time.  Should the patient develop abdominal symptomatology, CT scan of the abdomen may be indicated as she was difficult to examine.  She is more concerned about her right back pain, which she does state is resolving.  I told her there was no surgical intervention needed for that.  She understands and agrees.  Follow-up as needed.

## 2018-08-23 NOTE — Patient Instructions (Signed)
Lipoma    A lipoma is a noncancerous (benign) tumor that is made up of fat cells. This is a very common type of soft-tissue growth. Lipomas are usually found under the skin (subcutaneous). They may occur in any tissue of the body that contains fat. Common areas for lipomas to appear include the back, shoulders, buttocks, and thighs.   Lipomas grow slowly, and they are usually painless. Most lipomas do not cause problems and do not require treatment.  What are the causes?  The cause of this condition is not known.  What increases the risk?  You are more likely to develop this condition if:   You are 40-60 years old.   You have a family history of lipomas.  What are the signs or symptoms?  A lipoma usually appears as a small, round bump under the skin. In most cases, the lump will:   Feel soft or rubbery.   Not cause pain or other symptoms.  However, if a lipoma is located in an area where it pushes on nerves, it can become painful or cause other symptoms.  How is this diagnosed?  A lipoma can usually be diagnosed with a physical exam. You may also have tests to confirm the diagnosis and to rule out other conditions. Tests may include:   Imaging tests, such as a CT scan or MRI.   Removal of a tissue sample to be looked at under a microscope (biopsy).  How is this treated?  Treatment for this condition depends on the size of the lipoma and whether it is causing any symptoms.   For small lipomas that are not causing problems, no treatment is needed.   If a lipoma is bigger or it causes problems, surgery may be done to remove the lipoma. Lipomas can also be removed to improve appearance. Most often, the procedure is done after applying a medicine that numbs the area (local anesthetic).  Follow these instructions at home:   Watch your lipoma for any changes.   Keep all follow-up visits as told by your health care provider. This is important.  Contact a health care provider if:   Your lipoma becomes larger or  hard.   Your lipoma becomes painful, red, or increasingly swollen. These could be signs of infection or a more serious condition.  Get help right away if:   You develop tingling or numbness in an area near the lipoma. This could indicate that the lipoma is causing nerve damage.  Summary   A lipoma is a noncancerous tumor that is made up of fat cells.   Most lipomas do not cause problems and do not require treatment.   If a lipoma is bigger or it causes problems, surgery may be done to remove the lipoma.  This information is not intended to replace advice given to you by your health care provider. Make sure you discuss any questions you have with your health care provider.  Document Released: 07/01/2002 Document Revised: 06/27/2017 Document Reviewed: 06/27/2017  [employer]vier Interactive Patient Education  2019 [employer]

## 2018-09-05 ENCOUNTER — Encounter: Payer: Self-pay | Admitting: Pulmonary Disease

## 2018-09-05 ENCOUNTER — Ambulatory Visit (INDEPENDENT_AMBULATORY_CARE_PROVIDER_SITE_OTHER): Payer: Medicare Other | Admitting: Pulmonary Disease

## 2018-09-05 VITALS — BP 124/76 | HR 64 | Ht 65.0 in | Wt 274.6 lb

## 2018-09-05 DIAGNOSIS — E662 Morbid (severe) obesity with alveolar hypoventilation: Secondary | ICD-10-CM

## 2018-09-05 DIAGNOSIS — G4733 Obstructive sleep apnea (adult) (pediatric): Secondary | ICD-10-CM | POA: Diagnosis not present

## 2018-09-05 DIAGNOSIS — J4520 Mild intermittent asthma, uncomplicated: Secondary | ICD-10-CM

## 2018-09-05 DIAGNOSIS — E669 Obesity, unspecified: Secondary | ICD-10-CM

## 2018-09-05 DIAGNOSIS — G4730 Sleep apnea, unspecified: Secondary | ICD-10-CM | POA: Diagnosis not present

## 2018-09-05 NOTE — Progress Notes (Signed)
[facility]  Chief Complaint  Patient presents with  . Follow-up    pt on CPAP, no issues with pressure settings and mask; mild cough; denies chest pain/tightness    Constitutional:  BP 124/76 (BP Location: Right Arm, Cuff Size: Normal)   Pulse 64   Ht 5\' 5"  (1.651 m)   Wt 274 lb 9.6 oz (124.6 kg)   SpO2 91%   BMI 45.70 kg/m   Past Medical History:  OA, HLD, HTN, Myasthenia gravis, Gout, GERD, Depression, Anxiety  Brief Summary:  [name] is a 68 y.o. female former smoker with asthma, obstructive sleep apnea, and obesity hypoventilation syndrome.  I last saw her in 2011.  More recently seen by Dr. Ashok Cordia and [facility].  Uses CPAP nightly.  No issues with mask fit.  Denies sinus congestion, sore throat, dry mouth, or aerophagia.  Using 2 liters oxygen at night with CPAP.  Gets intermittent cough.  Uses albuterol 1 or 2 times per week.  Not having fever, chest pain, wheeze, sputum, hemoptysis, or leg swelling.   Physical Exam:   Appearance - well kempt   ENMT - clear nasal mucosa, midline nasal  septum, no oral exudates, no LAN, trachea midline  Respiratory - normal chest wall, normal respiratory effort, no accessory muscle use, no wheeze/rales  CV - s1s2 regular rate and rhythm, no murmurs, no peripheral edema, radial pulses symmetric  GI - soft, non tender, no masses  Lymph - no adenopathy noted in neck and axillary areas  MSK - normal gait  Ext - no cyanosis, clubbing, or joint inflammation noted  Skin - no rashes, lesions, or ulcers  Neuro - normal strength, oriented x 3  Psych - normal mood and affect   Assessment/Plan:   Obstructive sleep apnea. - she is compliant with CPAP and reports benefit - continue auto CPAP  Obesity hypoventilation syndrome. - continue 2 liters oxygen at night with CPAP - discussed importance of weight loss  Mild, intermittent asthma. - continue prn  albuterol  Myasthenia gravis with ocular involvement. - followed by Dr. Leta Baptist with neurology   Patient Instructions  Follow up in 1 year  A total of  28 minutes were spent face to face with the patient and more than half of that time involved counseling or coordination of care.   Chesley Mires, MD [facility] Pager: [phone] 09/05/2018, 11:13 AM  Flow Sheet     Pulmonary tests:  PFT 02/22/16 >> FEV1 1.31 (65%), FEV1% 86, DLCO 79%  Sleep tests:  PSG 05/15/05 >> AHI 34 Auto CPAP 08/05/18 to 09/03/18 >> used on 30 of 30 nights with average 8 hrs 14 min.  Average AHI 1.8 with median CPAP 7 and 95 th percentile CPAP 10 cm H2O.  Cardiac tests:  Echo 07/16/13 >> EF 65 to 70%   Medications:   Allergies as of 09/05/2018   No Known Allergies     Medication List       Accurate as of September 05, 2018 11:13 AM. Always use your most recent med list.        acetaminophen 500 MG tablet Commonly known as:  TYLENOL Take 1,000 mg by mouth every 6 (six) hours as needed for mild pain.   albuterol 108 (90 Base) MCG/ACT inhaler Commonly known as:  PROVENTIL HFA;VENTOLIN HFA Inhale 2 puffs into the lungs every 4 (four) hours as needed for wheezing or shortness of breath.   allopurinol 100 MG tablet  Commonly known as:  ZYLOPRIM TAKE 1 TABLET BY MOUTH DAILY   aspirin EC 81 MG tablet Take 81 mg by mouth every evening.   atorvastatin 10 MG tablet Commonly known as:  LIPITOR TAKE 1 TABLET(10 MG) BY MOUTH DAILY IN THE MORNING   diphenhydrAMINE 25 MG tablet Commonly known as:  BENADRYL Take 25 mg by mouth every morning.   fluticasone 0.05 % cream Commonly known as:  CUTIVATE APPLY EXTERNALLY TO THE AFFECTED AREA DAILY   furosemide 20 MG tablet Commonly known as:  LASIX TAKE 1 TABLET(20 MG) BY MOUTH DAILY FOR SWELLING   HYDROcodone-acetaminophen 7.5-325 MG tablet Commonly known as:  NORCO Take 1 tablet by mouth 2 (two) times daily as needed for  moderate pain.   losartan 50 MG tablet Commonly known as:  COZAAR TAKE 1 TABLET BY MOUTH DAILY   Magnesium 250 MG Tabs Take 1 tablet (250 mg total) by mouth daily.   OXYGEN Inhale 1 L into the lungs at bedtime. IN ADDITION TO CPAP NIGHTLY   pyridostigmine 60 MG tablet Commonly known as:  MESTINON Take 1 tablet (60 mg total) by mouth 3 (three) times daily.   ranitidine 150 MG tablet Commonly known as:  ZANTAC TAKE 1 TABLET BY MOUTH EVERY NIGHT AT BEDTIME   traZODone 50 MG tablet Commonly known as:  DESYREL TAKE 1/2 TO 1 TABLET(25 TO 50 MG) BY MOUTH AT BEDTIME AS NEEDED FOR SLEEP       Past Surgical History:  She  has a past surgical history that includes Tubal ligation; Cardiac catheterization (2006); Colonoscopy (N/A, 12/25/2013); Cataract extraction w/PHACO (Left, 09/17/2015); Cyst excision (N/A, 09/12/2016); Cataract extraction w/PHACO (Right, 05/15/2017); and Intraocular lens insertion (Bilateral, 2018).  Family History:  Her family history includes Crohn's disease in her daughter; Diabetes in her brother; Heart disease in her mother; Heart failure in her father; Lung disease in her father; Thyroid disease in her mother.  Social History:  She  reports that she quit smoking about 13 years ago. Her smoking use included cigarettes. She started smoking about 49 years ago. She has a 72.00 pack-year smoking history. She has never used smokeless tobacco. She reports that she does not drink alcohol or use drugs.

## 2018-09-05 NOTE — Patient Instructions (Signed)
Follow up in 1 year.

## 2018-09-06 ENCOUNTER — Other Ambulatory Visit: Payer: Self-pay | Admitting: Family Medicine

## 2018-09-06 NOTE — Telephone Encounter (Signed)
Last filled on 08/07/2018.

## 2018-09-06 NOTE — Telephone Encounter (Signed)
[facility] [place]  Patient needs refill on her hydrocodone

## 2018-09-07 ENCOUNTER — Other Ambulatory Visit: Payer: Self-pay | Admitting: Family Medicine

## 2018-09-07 MED ORDER — HYDROCODONE-ACETAMINOPHEN 7.5-325 MG PO TABS: 1.0000 | ORAL_TABLET | Freq: Two times a day (BID) | ORAL | 0 refills | Status: DC | PRN

## 2018-09-07 NOTE — Telephone Encounter (Signed)
Medication refilled

## 2018-09-17 ENCOUNTER — Other Ambulatory Visit: Payer: Self-pay | Admitting: Family Medicine

## 2018-09-22 DIAGNOSIS — J449 Chronic obstructive pulmonary disease, unspecified: Secondary | ICD-10-CM | POA: Diagnosis not present

## 2018-09-24 ENCOUNTER — Other Ambulatory Visit (HOSPITAL_COMMUNITY): Payer: Self-pay | Admitting: Family Medicine

## 2018-09-24 DIAGNOSIS — Z1231 Encounter for screening mammogram for malignant neoplasm of breast: Secondary | ICD-10-CM

## 2018-09-28 ENCOUNTER — Other Ambulatory Visit: Payer: Self-pay | Admitting: Family Medicine

## 2018-10-03 ENCOUNTER — Other Ambulatory Visit: Payer: Self-pay | Admitting: Family Medicine

## 2018-10-05 ENCOUNTER — Other Ambulatory Visit: Payer: Self-pay | Admitting: Family Medicine

## 2018-10-05 MED ORDER — HYDROCODONE-ACETAMINOPHEN 7.5-325 MG PO TABS: 1.0000 | ORAL_TABLET | Freq: Two times a day (BID) | ORAL | 0 refills | Status: DC | PRN

## 2018-10-05 NOTE — Telephone Encounter (Signed)
Ok to refill??  Last office visit 08/13/2018.  Last refill 09/07/2018.

## 2018-10-05 NOTE — Telephone Encounter (Signed)
Refill on hydrocodone to [facility] [address]

## 2018-10-08 ENCOUNTER — Other Ambulatory Visit: Payer: Self-pay

## 2018-10-08 ENCOUNTER — Ambulatory Visit (HOSPITAL_COMMUNITY)
Admission: RE | Admit: 2018-10-08 | Discharge: 2018-10-08 | Disposition: A | Discharge status: Home / Self Care | Payer: Medicare Other | Source: Ambulatory Visit | Attending: Family Medicine | Admitting: Family Medicine

## 2018-10-08 DIAGNOSIS — Z1231 Encounter for screening mammogram for malignant neoplasm of breast: Secondary | ICD-10-CM | POA: Diagnosis not present

## 2018-10-10 ENCOUNTER — Other Ambulatory Visit: Payer: Self-pay | Admitting: Family Medicine

## 2018-10-21 DIAGNOSIS — J449 Chronic obstructive pulmonary disease, unspecified: Secondary | ICD-10-CM | POA: Diagnosis not present

## 2018-11-01 ENCOUNTER — Other Ambulatory Visit: Payer: Self-pay | Admitting: Family Medicine

## 2018-11-06 ENCOUNTER — Other Ambulatory Visit: Payer: Self-pay | Admitting: Family Medicine

## 2018-11-06 MED ORDER — HYDROCODONE-ACETAMINOPHEN 7.5-325 MG PO TABS: 1.0000 | ORAL_TABLET | Freq: Two times a day (BID) | ORAL | 0 refills | Status: DC | PRN

## 2018-11-06 NOTE — Telephone Encounter (Signed)
Ok to refill??  Last office visit 08/13/2018.  Last refill 10/05/2018.

## 2018-11-06 NOTE — Telephone Encounter (Signed)
Patient needs refill on hydrocodone  [facility] [place]

## 2018-11-13 ENCOUNTER — Telehealth: Payer: Self-pay | Admitting: Family Medicine

## 2018-11-13 NOTE — Telephone Encounter (Signed)
Received call from patient.   Reports that she received letter from Medicare advising her to D/C Zantac.   Requested alternative.   MD please advise.

## 2018-11-13 NOTE — Telephone Encounter (Signed)
PATIENT CALLING TO ASK QUESTIONS ABOUT A LETTER SHE RECEIVED ABOUT STOPPING TAKING HER ZANTAC  [phone]

## 2018-11-13 NOTE — Telephone Encounter (Signed)
Change to pepcid 20mg  BID, if not available prilosec 40mg  once a day

## 2018-11-14 MED ORDER — OMEPRAZOLE 40 MG PO CPDR: 40.0000 mg | DELAYED_RELEASE_CAPSULE | Freq: Every day | ORAL | 3 refills | Status: DC

## 2018-11-14 NOTE — Telephone Encounter (Signed)
Call placed to patient and patient made aware.   Pepcid on backorder.   Omeprazole sent to pharmacy.

## 2018-11-21 DIAGNOSIS — J449 Chronic obstructive pulmonary disease, unspecified: Secondary | ICD-10-CM | POA: Diagnosis not present

## 2018-12-05 ENCOUNTER — Other Ambulatory Visit: Payer: Self-pay | Admitting: Family Medicine

## 2018-12-05 MED ORDER — HYDROCODONE-ACETAMINOPHEN 7.5-325 MG PO TABS: 1.0000 | ORAL_TABLET | Freq: Two times a day (BID) | ORAL | 0 refills | Status: DC | PRN

## 2018-12-05 NOTE — Telephone Encounter (Signed)
Patient needs refill on her hydrocodone  [facility]  [address] [employer] 

## 2018-12-05 NOTE — Telephone Encounter (Signed)
Ok to refill??  Last office visit 08/13/2018  Last refill 11/06/2018.

## 2018-12-06 ENCOUNTER — Other Ambulatory Visit: Payer: Self-pay | Admitting: Family Medicine

## 2018-12-10 ENCOUNTER — Encounter: Payer: Medicare Other | Admitting: Family Medicine

## 2018-12-14 ENCOUNTER — Other Ambulatory Visit: Payer: Self-pay | Admitting: Family Medicine

## 2018-12-20 ENCOUNTER — Encounter (INDEPENDENT_AMBULATORY_CARE_PROVIDER_SITE_OTHER): Payer: Self-pay | Admitting: *Deleted

## 2018-12-21 DIAGNOSIS — J449 Chronic obstructive pulmonary disease, unspecified: Secondary | ICD-10-CM | POA: Diagnosis not present

## 2018-12-28 ENCOUNTER — Other Ambulatory Visit: Payer: Self-pay | Admitting: Family Medicine

## 2019-01-02 ENCOUNTER — Encounter: Payer: Self-pay | Admitting: Family Medicine

## 2019-01-02 ENCOUNTER — Other Ambulatory Visit: Payer: Self-pay

## 2019-01-02 ENCOUNTER — Ambulatory Visit (INDEPENDENT_AMBULATORY_CARE_PROVIDER_SITE_OTHER): Payer: Medicare Other | Admitting: Family Medicine

## 2019-01-02 VITALS — BP 138/76 | HR 74 | Temp 98.8°F | Resp 18 | Ht 65.0 in | Wt 279.0 lb

## 2019-01-02 DIAGNOSIS — E785 Hyperlipidemia, unspecified: Secondary | ICD-10-CM

## 2019-01-02 DIAGNOSIS — M150 Primary generalized (osteo)arthritis: Secondary | ICD-10-CM

## 2019-01-02 DIAGNOSIS — I10 Essential (primary) hypertension: Secondary | ICD-10-CM | POA: Diagnosis not present

## 2019-01-02 DIAGNOSIS — Z0001 Encounter for general adult medical examination with abnormal findings: Secondary | ICD-10-CM | POA: Diagnosis not present

## 2019-01-02 DIAGNOSIS — M159 Polyosteoarthritis, unspecified: Secondary | ICD-10-CM

## 2019-01-02 DIAGNOSIS — Z0000 Encounter for general adult medical examination without abnormal findings: Secondary | ICD-10-CM

## 2019-01-02 DIAGNOSIS — R7302 Impaired glucose tolerance (oral): Secondary | ICD-10-CM | POA: Diagnosis not present

## 2019-01-02 DIAGNOSIS — M8949 Other hypertrophic osteoarthropathy, multiple sites: Secondary | ICD-10-CM

## 2019-01-02 MED ORDER — HYDROCODONE-ACETAMINOPHEN 7.5-325 MG PO TABS: 1.0000 | ORAL_TABLET | Freq: Two times a day (BID) | ORAL | 0 refills | Status: DC | PRN

## 2019-01-02 MED ORDER — ALLOPURINOL 100 MG PO TABS: 100.0000 mg | ORAL_TABLET | Freq: Every day | ORAL | 3 refills | Status: DC

## 2019-01-02 NOTE — Patient Instructions (Addendum)
Referral to foot doctor  We will call with lab results Pain medication refilled F/U 4 months

## 2019-01-02 NOTE — Progress Notes (Signed)
Subjective:   Patient presents for Medicare Annual/Subsequent preventive examination.   Pt here for wellness. Medications reviewed  Continues to get pains in back pains of knees has know OA, also feels a little weak in legs, wants to discuss with her neurologist as she has myasthenia, no recent falls   Needs referral to podiatry for long nails, she cut skin on toe Request refill on hydrocodone   Gout- has not had any major flares, taking allopurinol  OSA- seen by lung for sleep,using CPAP as prescribed   Needs handicap placard form   Review Past Medical/Family/Social: per EMR   Risk Factors  Current exercise habits: none  Dietary issues discussed: Yes  Cardiac risk factors: Obesity (BMI >= 30 kg/m2). HTN, Hyperlipidemia  Depression Screen  (Note: if answer to either of the following is "Yes", a more complete depression screening is indicated)  Over the past two weeks, have you felt down, depressed or hopeless? No Over the past two weeks, have you felt little interest or pleasure in doing things? No Have you lost interest or pleasure in daily life? No Do you often feel hopeless? No Do you cry easily over simple problems? No   Activities of Daily Living  In your present state of health, do you have any difficulty performing the following activities?:  Driving? No  Managing money? No  Feeding yourself? No  Getting from bed to chair? No  Climbing a flight of stairs? No  Preparing food and eating?: No  Bathing or showering? No  Getting dressed: No  Getting to the toilet? No  Using the toilet:No  Moving around from place to place: No  In the past year have you fallen or had a near fall?:No  Are you sexually active? No  Do you have more than one partner? No   Hearing Difficulties: No  Do you often ask people to speak up or repeat themselves? No  Do you experience ringing or noises in your ears? No Do you have difficulty understanding soft or whispered voices? No  Do you  feel that you have a problem with memory? No Do you often misplace items? No  Do you feel safe at home? Yes  Cognitive Testing  Alert? Yes Normal Appearance?Yes  Oriented to person? Yes Place? Yes  Time? Yes  Recall of three objects? Yes  Can perform simple calculations? Yes  Displays appropriate judgment?Yes  Can read the correct time from a watch face?Yes   List the Names of Other Physician/Practitioners you currently use:  Pulmonary- Dr. Halford [facility]-  [place]   Screening Tests / Date Colonoscopy           UTD          Zostavax UTD 2016 Bone density - normal n 2015 Pneumonia- UTD  Mammogram  UTD Hepatitis C screening UTD Influenza Vaccine UTD Tetanus/tdap Due -unable to afford at this time  ROS: GEN- denies fatigue, fever, weight loss,weakness, recent illness HEENT- denies eye drainage, change in vision, nasal discharge, CVS- denies chest pain, palpitations RESP- denies SOB, cough, wheeze ABD- denies N/V, change in stools, abd pain GU- denies dysuria, hematuria, dribbling, incontinence MSK- +joint pain, muscle aches, injury Neuro- denies headache, dizziness, syncope, seizure activity  Physical: vitals reviewed, strong odor to patient, especially LE/FEET  GEN- NAD, alert and oriented x3 HEENT- PERRL, EOMI, non injected sclera, pink conjunctiva, MMM, oropharynx clear Neck- Supple, no thryomegaly, no bruit CVS- RRR, no murmur RESP-CTAB ABD-NABS,soft,NT,ND EXT- trace edema, very long thick  nails, callus feet bilat fungus nails, dry flaking skin  Pulses- Radial 2+, DP- diminished     Assessment:    Annual wellness medicare exam   Plan:    During the course of the visit the patient was educated and counseled about appropriate screening and preventive services including:   Prevention up-to-date with the exception of Tdap but she is unable to afford this at this time.  Fall/audit C/depression screen negative  Discussed advanced directives she is  going to discuss with her son and her daughter she was given a handout on this.  Chronic osteoarthritis chronic pain refilled her hydrocodone.  Also provided with handicap placard  Myasthenia gravis we will follow-up with neurology next month she is still taking her medications as prescribed  Hypertrophic nails callus of the feet will refer to podiatry to assist with hygiene  Hypertension repeat blood pressure improved no change in medication  Lipidemia check fasting labs continue Lipitor     Diet review for nutrition referral? Yes ____ Not Indicated __x__  Patient Instructions (the written plan) was given to the patient.  Medicare Attestation  I have personally reviewed:  The patient's medical and social history  Their use of alcohol, tobacco or illicit drugs  Their current medications and supplements  The patient's functional ability including ADLs,fall risks, home safety risks, cognitive, and hearing and visual impairment  Diet and physical activities  Evidence for depression or mood disorders  The patient's weight, height, BMI, and visual acuity have been recorded in the chart. I have made referrals, counseling, and provided education to the patient based on review of the above and I have provided the patient with a written personalized care plan for preventive services.

## 2019-01-03 ENCOUNTER — Other Ambulatory Visit: Payer: Self-pay | Admitting: Family Medicine

## 2019-01-03 ENCOUNTER — Telehealth: Payer: Self-pay | Admitting: *Deleted

## 2019-01-03 LAB — CBC WITH DIFFERENTIAL/PLATELET
Absolute Monocytes: 630 cells/uL (ref 200–950)
Basophils Absolute: 78 cells/uL (ref 0–200)
Basophils Relative: 1.3 %
Eosinophils Absolute: 378 cells/uL (ref 15–500)
Eosinophils Relative: 6.3 %
HCT: 36.9 % (ref 35.0–45.0)
Hemoglobin: 12.4 g/dL (ref 11.7–15.5)
Lymphs Abs: 1698 cells/uL (ref 850–3900)
MCH: 30.5 pg (ref 27.0–33.0)
MCHC: 33.6 g/dL (ref 32.0–36.0)
MCV: 90.7 fL (ref 80.0–100.0)
MPV: 10.3 fL (ref 7.5–12.5)
Monocytes Relative: 10.5 %
Neutro Abs: 3216 cells/uL (ref 1500–7800)
Neutrophils Relative %: 53.6 %
Platelets: 223 10*3/uL (ref 140–400)
RBC: 4.07 10*6/uL (ref 3.80–5.10)
RDW: 12.9 % (ref 11.0–15.0)
Total Lymphocyte: 28.3 %
WBC: 6 10*3/uL (ref 3.8–10.8)

## 2019-01-03 LAB — COMPREHENSIVE METABOLIC PANEL
AG Ratio: 1.3 (calc) (ref 1.0–2.5)
ALT: 9 U/L (ref 6–29)
AST: 15 U/L (ref 10–35)
Albumin: 3.7 g/dL (ref 3.6–5.1)
Alkaline phosphatase (APISO): 70 U/L (ref 37–153)
BUN/Creatinine Ratio: 8 (calc) (ref 6–22)
BUN: 11 mg/dL (ref 7–25)
CO2: 29 mmol/L (ref 20–32)
Calcium: 8.6 mg/dL (ref 8.6–10.4)
Chloride: 106 mmol/L (ref 98–110)
Creat: 1.38 mg/dL — ABNORMAL HIGH (ref 0.50–0.99)
Globulin: 2.9 g/dL (calc) (ref 1.9–3.7)
Glucose, Bld: 85 mg/dL (ref 65–99)
Potassium: 4.8 mmol/L (ref 3.5–5.3)
Sodium: 141 mmol/L (ref 135–146)
Total Bilirubin: 0.6 mg/dL (ref 0.2–1.2)
Total Protein: 6.6 g/dL (ref 6.1–8.1)

## 2019-01-03 LAB — LIPID PANEL
Cholesterol: 191 mg/dL (ref <200)
HDL: 54 mg/dL (ref >=50)
LDL Cholesterol (Calc): 104 mg/dL (calc) — ABNORMAL HIGH
Non-HDL Cholesterol (Calc): 137 mg/dL (calc) — ABNORMAL HIGH (ref <130)
Total CHOL/HDL Ratio: 3.5 (calc) (ref <5.0)
Triglycerides: 212 mg/dL — ABNORMAL HIGH (ref <150)

## 2019-01-03 NOTE — Telephone Encounter (Signed)
Called patient and informed her  due to current COVID 19 pandemic, our office is severely reducing in person visits in order to minimize the risk to our patients and healthcare providers. We recommend to convert your appointment to a video visit. She stated she is unable to do video visit. We rescheduled her for July. I explained in office check in procedure. She stated she doesn't have covid and wouldn't come if she did. I advised her it is our current check in policy, subject to change. She stated she can't wait outside in a hot car. I advised her that the policy may change; I don't have any way of knowing. I am obligated to let her know our current policy. She verbalized understanding, appreciation.

## 2019-01-04 ENCOUNTER — Other Ambulatory Visit: Payer: Self-pay | Admitting: *Deleted

## 2019-01-04 MED ORDER — ATORVASTATIN CALCIUM 20 MG PO TABS: 20.0000 mg | ORAL_TABLET | Freq: Every day | ORAL | 3 refills | Status: DC

## 2019-01-08 ENCOUNTER — Ambulatory Visit: Payer: Medicare Other | Admitting: Diagnostic Neuroimaging

## 2019-01-21 DIAGNOSIS — J449 Chronic obstructive pulmonary disease, unspecified: Secondary | ICD-10-CM | POA: Diagnosis not present

## 2019-01-23 ENCOUNTER — Encounter: Payer: Self-pay | Admitting: Family Medicine

## 2019-01-23 ENCOUNTER — Other Ambulatory Visit: Payer: Self-pay

## 2019-01-23 ENCOUNTER — Ambulatory Visit (INDEPENDENT_AMBULATORY_CARE_PROVIDER_SITE_OTHER): Payer: Medicare Other | Admitting: Family Medicine

## 2019-01-23 VITALS — BP 136/84 | HR 82 | Temp 98.9°F | Resp 14 | Ht 65.0 in | Wt 282.0 lb

## 2019-01-23 DIAGNOSIS — M79671 Pain in right foot: Secondary | ICD-10-CM

## 2019-01-23 DIAGNOSIS — Q845 Enlarged and hypertrophic nails: Secondary | ICD-10-CM | POA: Diagnosis not present

## 2019-01-23 DIAGNOSIS — R609 Edema, unspecified: Secondary | ICD-10-CM

## 2019-01-23 NOTE — Patient Instructions (Addendum)
Elevate the feet Referral to podiatry  Take 2 of the of fluid pills for 5 days - stop Sunday, then go back to 1 on Monday Use the compression hose F/U as previous

## 2019-01-23 NOTE — Progress Notes (Signed)
   Subjective:    Patient ID: [name], female    DOB: 29-Nov-1950, 68 y.o.   MRN: [id number]  Patient presents for R Foot Pain (x4 days- hard knot to top of foot around 4th and 5th tarsals- slight discoloration to area- swelling to top of foot and ankle)  Here states that she has a hard spot on her right foot beneath the fourth and fifth digits she also has swelling on top of the foot.  However when we looked at her feet she has swelling on both feet no appreciable redness no drainage no ulcer no injury to the foot.  She denies any pain.  Her weight is actually up 3 pounds since her visit a few weeks ago. Also needs help getting her nails trimmed  Review Of Systems:  GEN- denies fatigue, fever, weight loss,weakness, recent illness HEENT- denies eye drainage, change in vision, nasal discharge, CVS- denies chest pain, palpitations RESP- denies SOB, cough, wheeze ABD- denies N/V, change in stools, abd pain GU- denies dysuria, hematuria, dribbling, incontinence MSK- + joint pain, muscle aches, injury Neuro- denies headache, dizziness, syncope, seizure activity       Objective:    BP 136/84   Pulse 82   Temp 98.9 F (37.2 C) (Oral)   Resp 14   Ht 5\' 5"  (1.651 m)   Wt 282 lb (127.9 kg)   SpO2 96%   BMI 46.93 kg/m  GEN- NAD, alert and oriented x3 HEENT- PERRL, EOMI, non injected sclera, pink conjunctiva, MMM, oropharynx clear CVS- RRR, no murmur RESP-CTAB ABD-NABS,soft,NT,ND EXT- 1+ bilateral edema to mid shins.  Questionable small cystic lesion beneath the swelling on the right foot at the base of the fourth and fifth digits no ulceration.  Long hypertrophic nails no erythema  Normal range of motion of ankles Pulses- Radial 2+, DP-diminished         Assessment & Plan:      Problem List Items Addressed This Visit      Unprioritized   Peripheral edema    Most of her discomfort is from increased edema which she is had on and off.  Increase her Lasix for 5 days        Other Visit Diagnoses    Right foot pain    -  Primary   ? cystic lesion, no infection, has bilat edema, increase lasix to 40mg  daily for 5 days, elevate feet, podiatry referral for nails/pain   Enlarged and hypertrophic nails          Note: This dictation was prepared with Dragon dictation along with smaller phrase technology. Any transcriptional errors that result from this process are unintentional.

## 2019-01-23 NOTE — Assessment & Plan Note (Signed)
Most of her discomfort is from increased edema which she is had on and off.  Increase her Lasix for 5 days

## 2019-02-01 ENCOUNTER — Other Ambulatory Visit: Payer: Self-pay | Admitting: Diagnostic Neuroimaging

## 2019-02-01 ENCOUNTER — Other Ambulatory Visit: Payer: Self-pay | Admitting: Family Medicine

## 2019-02-05 ENCOUNTER — Other Ambulatory Visit: Payer: Self-pay | Admitting: Family Medicine

## 2019-02-05 MED ORDER — HYDROCODONE-ACETAMINOPHEN 7.5-325 MG PO TABS: 1.0000 | ORAL_TABLET | Freq: Two times a day (BID) | ORAL | 0 refills | Status: DC | PRN

## 2019-02-05 NOTE — Telephone Encounter (Signed)
REFILL  ON HYDROCODONE TO [facility]

## 2019-02-05 NOTE — Telephone Encounter (Signed)
Ok to refill??  Last office visit 01/23/2019.  Last refill 01/02/2019.

## 2019-02-06 ENCOUNTER — Other Ambulatory Visit: Payer: Self-pay

## 2019-02-06 ENCOUNTER — Telehealth: Payer: Self-pay | Admitting: Podiatry

## 2019-02-06 ENCOUNTER — Encounter: Payer: Self-pay | Admitting: Diagnostic Neuroimaging

## 2019-02-06 ENCOUNTER — Ambulatory Visit (INDEPENDENT_AMBULATORY_CARE_PROVIDER_SITE_OTHER): Payer: Medicare Other | Admitting: Diagnostic Neuroimaging

## 2019-02-06 VITALS — BP 152/74 | HR 74 | Temp 96.4°F | Ht 65.0 in | Wt 280.2 lb

## 2019-02-06 DIAGNOSIS — G7000 Myasthenia gravis without (acute) exacerbation: Secondary | ICD-10-CM | POA: Diagnosis not present

## 2019-02-06 MED ORDER — PYRIDOSTIGMINE BROMIDE 60 MG PO TABS: 60.0000 mg | ORAL_TABLET | Freq: Three times a day (TID) | ORAL | 4 refills | Status: DC

## 2019-02-06 NOTE — Progress Notes (Signed)
[facility]  PATIENT: [name] DOB: [birth year]  REFERRING CLINICIAN: Mincey HISTORY FROM: patient  REASON FOR VISIT: follow up   HISTORICAL  CHIEF COMPLAINT:  Chief Complaint  Patient presents with  . Ocular myasthenia    rm 7 FU  "good days and bad days with my eye"    HISTORY OF PRESENT ILLNESS:   UPDATE (02/06/19, VRP): Since last visit, doing about the same. Symptoms are stable. Severity is mild. No alleviating or aggravating factors. Tolerating pyridostigmine. Mild intermittent left ptosis. No SOB, chewing or swallowing issues.   UPDATE (01/02/18, VRP): Since last visit, doing well. Tolerating meds. No alleviating or aggravating factors. No SOB, swallow diff, speech diff. Vision stable.   UPDATE (07/05/17, VRP): Since last visit, doing well. Tolerating pyridostigmine (60mg  three times a day). No alleviating or aggravating factors. Eyes are better. Has some flare of sxs every month (2x per month; few hrs each time). No breathing, swallowing or speaking problems. CT chest reviewed with patient.   PRIOR HPI (03/22/17): 68 year old female with hypertension, hypercholesterolemia, anxiety, here for evaluation of double vision. 6 weeks ago patient had sudden onset of redness in both eyes. She then developed double vision and left eyelid drooping. She went to eye doctor, then referred to emergency room for MRI testing to rule out stroke. MRI of the brain and orbits were negative for acute process. Patient then had myasthenia gravis antibody panel testing which was notable for borderline positive anti-striation autoantibodies. Patient for here for further evaluation and management. Patient also reports a number of other symptoms including fatigue, swelling in legs, wheezing, headache, numbness, weakness, slurred speech. However these symptoms do not correlate with the recent 6 weeks of double vision and left eyelid weakness. Patient has been using an eye patch due  to her double vision. Symptoms have been fairly consistent since onset. However later in her conversation patient states that symptoms fluctuate and are worse in the evening.   REVIEW OF SYSTEMS: Full 14 system review of systems performed and negative with exception of: as per HPI.   ALLERGIES: No Known Allergies  HOME MEDICATIONS: Outpatient Medications Prior to Visit  Medication Sig Dispense Refill  . acetaminophen (TYLENOL) 500 MG tablet Take 1,000 mg by mouth every 6 (six) hours as needed for mild pain.     Marland Kitchen albuterol (PROVENTIL HFA;VENTOLIN HFA) 108 (90 Base) MCG/ACT inhaler Inhale 2 puffs into the lungs every 4 (four) hours as needed for wheezing or shortness of breath. 1 Inhaler 5  . allopurinol (ZYLOPRIM) 100 MG tablet Take 1 tablet (100 mg total) by mouth daily. 90 tablet 3  . aspirin EC 81 MG tablet Take 81 mg by mouth every evening.     Marland Kitchen atorvastatin (LIPITOR) 20 MG tablet Take 1 tablet (20 mg total) by mouth daily at 6 PM. 90 tablet 3  . diphenhydrAMINE (BENADRYL) 25 MG tablet Take 25 mg by mouth every morning.     . fluticasone (CUTIVATE) 0.05 % cream APPLY EXTERNALLY TO THE AFFECTED AREA DAILY 30 g 3  . furosemide (LASIX) 20 MG tablet TAKE 1 TABLET(20 MG) BY MOUTH DAILY FOR SWELLING 90 tablet 0  . HYDROcodone-acetaminophen (NORCO) 7.5-325 MG tablet Take 1 tablet by mouth 2 (two) times daily as needed for moderate pain. 60 tablet 0  . losartan (COZAAR) 50 MG tablet TAKE 1 TABLET BY MOUTH DAILY 90 tablet 3  . Magnesium 250 MG TABS Take 1 tablet (250 mg total) by mouth daily. 30 tablet 11  .  omeprazole (PRILOSEC) 40 MG capsule Take 1 capsule (40 mg total) by mouth daily. 30 capsule 3  . OXYGEN Inhale 1 L into the lungs at bedtime. IN ADDITION TO CPAP NIGHTLY    . pyridostigmine (MESTINON) 60 MG tablet TAKE 1 TABLET(60 MG) BY MOUTH THREE TIMES DAILY 270 tablet 4  . traZODone (DESYREL) 50 MG tablet TAKE 1/2 TO 1 TABLET(25 TO 50 MG) BY MOUTH AT BEDTIME AS NEEDED FOR SLEEP 90  tablet 0   No facility-administered medications prior to visit.     PAST MEDICAL HISTORY: Past Medical History:  Diagnosis Date  . Achilles tendinitis   . Anxiety   . Asthma   . Depression   . GERD (gastroesophageal reflux disease)   . Gout   . H/O myasthenia gravis    left eye  . HTN (hypertension)   . Hyperlipidemia   . Low back pain   . Obesity hypoventilation syndrome ([place])   . Obstructive sleep apnea   . Osteoarthritis     PAST SURGICAL HISTORY: Past Surgical History:  Procedure Laterality Date  . CARDIAC CATHETERIZATION  2006  . CATARACT EXTRACTION W/PHACO Left 09/17/2015   Procedure: CATARACT EXTRACTION PHACO AND INTRAOCULAR LENS PLACEMENT LEFT EYE cde=8.58;  Surgeon: Tonny Branch, MD;  Location: [facility];  Service: Ophthalmology;  Laterality: Left;  . CATARACT EXTRACTION W/PHACO Right 05/15/2017   Procedure: CATARACT EXTRACTION PHACO AND INTRAOCULAR LENS PLACEMENT (IOC);  Surgeon: Tonny Branch, MD;  Location: [facility];  Service: Ophthalmology;  Laterality: Right;  CDE: 6.34  . COLONOSCOPY N/A 12/25/2013   Procedure: COLONOSCOPY;  Surgeon: Rogene Houston, MD;  Location: [facility] SUITE;  Service: Endoscopy;  Laterality: N/A;  730-rescheduled Ann notified pt  . CYST EXCISION N/A 09/12/2016   Procedure: EXCISION SEBACEOUS CYST, BACK;  Surgeon: Aviva Signs, MD;  Location: [facility];  Service: General;  Laterality: N/A;  . INTRAOCULAR LENS INSERTION Bilateral 2018   Dr. Tonny Branch   . TUBAL LIGATION      FAMILY HISTORY: Family History  Problem Relation Age of Onset  . Heart disease Mother   . Thyroid disease Mother   . Heart failure Father   . Lung disease Father   . Diabetes Brother   . Crohn's disease Daughter     SOCIAL HISTORY:  Social History   Socioeconomic History  . Marital status: Widowed    Spouse name: Not on file  . Number of children: 3  . Years of education: 74  . Highest education level: Not on file  Occupational History  . Occupation: disabled     Fish farm manager: UNEMPLOYED  Social Needs  . Financial resource strain: Not on file  . Food insecurity    Worry: Not on file    Inability: Not on file  . Transportation needs    Medical: Not on file    Non-medical: Not on file  Tobacco Use  . Smoking status: Former Smoker    Packs/day: 2.00    Years: 36.00    Pack years: 72.00    Types: Cigarettes    Start date: 06/25/1969    Quit date: 03/25/2005    Years since quitting: 13.8  . Smokeless tobacco: Never Used  Substance and Sexual Activity  . Alcohol use: No    Alcohol/week: 0.0 standard drinks  . Drug use: No  . Sexual activity: Never    Birth control/protection: Post-menopausal  Lifestyle  . Physical activity    Days per week: Not on file    Minutes  per session: Not on file  . Stress: Not on file  Relationships  . Social Herbalist on phone: Not on file    Gets together: Not on file    Attends religious service: Not on file    Active member of club or organization: Not on file    Attends meetings of clubs or organizations: Not on file    Relationship status: Not on file  . Intimate partner violence    Fear of current or ex partner: Not on file    Emotionally abused: Not on file    Physically abused: Not on file    Forced sexual activity: Not on file  Other Topics Concern  . Not on file  Social History Narrative   Originally from [place]. She has always lived in [place]. 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 No pets currently. No bird, mold, or hot tub exposure.    Lives alone   No caffeine     PHYSICAL EXAM  GENERAL EXAM/CONSTITUTIONAL: Vitals:  Vitals:   02/06/19 1245  BP: (!) 152/74  Pulse: 74  Temp: (!) 96.4 F (35.8 C)  Weight: 280 lb 3.2 oz (127.1 kg)  Height: 5\' 5"  (1.651 m)   Body mass index is 46.63 kg/m. No exam data present  Patient is in no distress; well developed, nourished and groomed; neck is supple  CARDIOVASCULAR:  Examination of carotid arteries  is normal; no carotid bruits  Regular rate and rhythm, no murmurs  Examination of peripheral vascular system by observation and palpation is normal  EYES:  Ophthalmoscopic exam of optic discs and posterior segments is normal; no papilledema or hemorrhages  MUSCULOSKELETAL:  Gait, strength, tone, movements noted in Neurologic exam below  NEUROLOGIC: MENTAL STATUS:  No flowsheet data found.  awake, alert, oriented to person, place and time  recent and remote memory intact  normal attention and concentration  language fluent, comprehension intact, naming intact,   fund of knowledge appropriate  CRANIAL NERVE:   2nd - no papilledema on fundoscopic exam  2nd, 3rd, 4th, 6th - pupils equal and reactive to light, visual fields full to confrontation, extraocular muscles intact, no nystagmus; LEFT PTOSIS; NO DIPLOPIA  5th - facial sensation symmetric  7th - facial strength symmetric  8th - hearing intact  9th - palate elevates symmetrically, uvula midline  11th - shoulder shrug symmetric  12th - tongue protrusion midline  MOTOR:   normal bulk and tone, full strength in the BUE, BLE  SENSORY:   normal and symmetric to light touch  COORDINATION:   finger-nose-finger, fine finger movements normal  REFLEXES:   deep tendon reflexes present and symmetric  GAIT/STATION:   narrow based gait    DIAGNOSTIC DATA (LABS, IMAGING, TESTING) - I reviewed patient records, labs, notes, testing and imaging myself where available.  Lab Results  Component Value Date   WBC 6.0 01/02/2019   HGB 12.4 01/02/2019   HCT 36.9 01/02/2019   MCV 90.7 01/02/2019   PLT 223 01/02/2019      Component Value Date/Time   NA 141 01/02/2019 1214   K 4.8 01/02/2019 1214   CL 106 01/02/2019 1214   CO2 29 01/02/2019 1214   GLUCOSE 85 01/02/2019 1214   BUN 11 01/02/2019 1214   CREATININE 1.38 (H) 01/02/2019 1214   CALCIUM 8.6 01/02/2019 1214   PROT 6.6 01/02/2019 1214   ALBUMIN  3.5 02/24/2017 0938   AST 15 01/02/2019 1214  ALT 9 01/02/2019 1214   ALKPHOS 71 02/24/2017 0938   BILITOT 0.6 01/02/2019 1214   GFRNONAA 46 (L) 02/24/2017 0938   GFRAA 54 (L) 02/24/2017 0938   Lab Results  Component Value Date   CHOL 191 01/02/2019   HDL 54 01/02/2019   LDLCALC 104 (H) 01/02/2019   LDLDIRECT 157 (H) 02/25/2013   TRIG 212 (H) 01/02/2019   CHOLHDL 3.5 01/02/2019   Lab Results  Component Value Date   HGBA1C 5.6 12/06/2017   Lab Results  Component Value Date   VITAMINB12 450 01/25/2012   Lab Results  Component Value Date   TSH 1.766 01/25/2012    02/24/17 MRI brain / orbits [I reviewed images myself and agree with interpretation. -VRP]  - No explanation for symptoms. No infarct, compressive lesion, or neuritis findings.  03/01/17 labs - anti AchR binding, blocking, modulation - normal - anti striational ab - 1:40 (h)  04/05/17 CT chest - No evidence of residual thymic tissue or or thymoma. - No acute abnormality noted.    ASSESSMENT AND PLAN  68 y.o. year old female here with new onset of double vision left eyelid ptosis, with positive ice pack test in office, and slightly positive anti-striation all antibody testing. Findings consistent with ocular myasthenia gravis. Doing better on pyridostigmine.    Dx:  1. Ocular myasthenia (HCC)      PLAN:  MYASTHENIA GRAVIS - continue pyridostigmine 60mg  three times per day - may consider additional immunosuppressant therapy (cellcept, imuran, prednisone) in future - cautioned patient if she develops breathing, chewing, swallowing sxs; patient to contact us right away or go to ER if bulbar or generalized symptoms develop  Meds ordered this encounter  Medications  . pyridostigmine (MESTINON) 60 MG tablet    Sig: Take 1 tablet (60 mg total) by mouth 3 (three) times daily.    Dispense:  270 tablet    Refill:  4   Return in about 8 months (around 10/07/2019). or sooner if needed    Penni Bombard, MD 0/25/4270, 6:23 PM Certified in Neurology, Neurophysiology and Neuroimaging  [facility] [address], [place], [place] [address] [phone]

## 2019-02-06 NOTE — Telephone Encounter (Signed)
Pt called back about message that was left on voicemail to answer pre-screening questions prior to coming in for her appointment tomorrow. When I tried to ask the pt the questions, she started to raise her voice and told me we should have called her sooner than we did. I tried to explain that all of [place] in the office were calling patients and pt just kept talking over me and was being loud and rude. I told pt I did not appreciate her being rude and I would let my supervisor know and then I hung up the phone.

## 2019-02-07 ENCOUNTER — Other Ambulatory Visit: Payer: Self-pay

## 2019-02-07 ENCOUNTER — Ambulatory Visit (INDEPENDENT_AMBULATORY_CARE_PROVIDER_SITE_OTHER): Payer: Medicare Other | Admitting: Podiatry

## 2019-02-07 ENCOUNTER — Other Ambulatory Visit: Payer: Self-pay | Admitting: Podiatry

## 2019-02-07 ENCOUNTER — Ambulatory Visit (INDEPENDENT_AMBULATORY_CARE_PROVIDER_SITE_OTHER): Payer: Medicare Other

## 2019-02-07 VITALS — Temp 98.1°F

## 2019-02-07 DIAGNOSIS — M79671 Pain in right foot: Secondary | ICD-10-CM

## 2019-02-07 DIAGNOSIS — M79672 Pain in left foot: Secondary | ICD-10-CM

## 2019-02-07 DIAGNOSIS — M19079 Primary osteoarthritis, unspecified ankle and foot: Secondary | ICD-10-CM

## 2019-02-07 DIAGNOSIS — M19072 Primary osteoarthritis, left ankle and foot: Secondary | ICD-10-CM

## 2019-02-07 DIAGNOSIS — M19071 Primary osteoarthritis, right ankle and foot: Secondary | ICD-10-CM | POA: Diagnosis not present

## 2019-02-07 DIAGNOSIS — B351 Tinea unguium: Secondary | ICD-10-CM

## 2019-02-07 DIAGNOSIS — I872 Venous insufficiency (chronic) (peripheral): Secondary | ICD-10-CM

## 2019-02-15 ENCOUNTER — Other Ambulatory Visit: Payer: Self-pay

## 2019-02-18 ENCOUNTER — Encounter: Payer: Self-pay | Admitting: Obstetrics & Gynecology

## 2019-02-18 ENCOUNTER — Ambulatory Visit (INDEPENDENT_AMBULATORY_CARE_PROVIDER_SITE_OTHER): Payer: Medicare Other | Admitting: Obstetrics & Gynecology

## 2019-02-18 ENCOUNTER — Other Ambulatory Visit: Payer: Self-pay

## 2019-02-18 VITALS — BP 134/78 | Ht 65.0 in | Wt 270.0 lb

## 2019-02-18 DIAGNOSIS — Z780 Asymptomatic menopausal state: Secondary | ICD-10-CM | POA: Diagnosis not present

## 2019-02-18 DIAGNOSIS — Z6841 Body Mass Index (BMI) 40.0 and over, adult: Secondary | ICD-10-CM

## 2019-02-18 DIAGNOSIS — Z01419 Encounter for gynecological examination (general) (routine) without abnormal findings: Secondary | ICD-10-CM

## 2019-02-18 DIAGNOSIS — Z13820 Encounter for screening for osteoporosis: Secondary | ICD-10-CM

## 2019-02-18 NOTE — Patient Instructions (Signed)
1. Well female exam with routine gynecological exam Normal gynecologic exam.  Pap test May 2019 was negative, no indication to repeat this year.  Breast exam normal.  Screening mammogram February 2020 negative.  Health labs with family physician.  Scheduling colonoscopy now.  2. Postmenopause Well on no hormone replacement therapy.  No postmenopausal bleeding since investigation July 2019 when the endometrial lining was thin at 4.3 mm.  3. Screening for osteoporosis Last bone density 5 years ago was normal.  We will repeat a bone density at [facility] now.  Vitamin D supplements, calcium intake of 1200 mg daily and weightbearing physical activity as much as possible. - DG Bone Density; Future  4. Class 3 severe obesity due to excess calories with serious comorbidity and body mass index (BMI) of 40.0 to 44.9 in adult [facility]) Recommend a lower calorie/carb diet such as [employer].  Aerobic activities 5 times a week and weightlifting every 2 days.  Steffanie, it was a pleasure seeing you today!

## 2019-02-18 NOTE — Progress Notes (Signed)
[name] Dec 21, 1950 [id number]   History:    68 y.o. G3P3L3 Single  RP:  Established patient presenting for annual gyn exam   HPI: Menopause, well on no HRT.  No PMB since investigation negative last year.  Pelvic US 01/2018 Endometrial line normal thin at 4.3 mm.  No pelvic pain.  Abstinent.  Urine/BMs normal.  Breasts normal.  BMI 44.93.  Not exercising, limited by arthritis.  Health labs with Fam MD.    Past medical history,surgical history, family history and social history were all reviewed and documented in the EPIC chart.  Gynecologic History No LMP recorded. Patient is postmenopausal. Contraception: abstinence and post menopausal status Last Pap: 11/2017. Results were: Negative Last mammogram: 08/2018. Results were: Negative Bone Density: 03/2014 Normal.  Schedule at [facility]: Will schedule now  Obstetric History OB History  Gravida Para Term Preterm AB Living  3 3 3     3   SAB TAB Ectopic Multiple Live Births               # Outcome Date GA Lbr Len/2nd Weight Sex Delivery Anes PTL Lv  3 Term           2 Term           1 Term              ROS: A ROS was performed and pertinent positives and negatives are included in the history.  GENERAL: No fevers or chills. HEENT: No change in vision, no earache, sore throat or sinus congestion. NECK: No pain or stiffness. CARDIOVASCULAR: No chest pain or pressure. No palpitations. PULMONARY: No shortness of breath, cough or wheeze. GASTROINTESTINAL: No abdominal pain, nausea, vomiting or diarrhea, melena or bright red blood per rectum. GENITOURINARY: No urinary frequency, urgency, hesitancy or dysuria. MUSCULOSKELETAL: No joint or muscle pain, no back pain, no recent trauma. DERMATOLOGIC: No rash, no itching, no lesions. ENDOCRINE: No polyuria, polydipsia, no heat or cold intolerance. No recent change in weight. HEMATOLOGICAL: No anemia or easy bruising or bleeding. NEUROLOGIC: No headache, seizures, numbness,  tingling or weakness. PSYCHIATRIC: No depression, no loss of interest in normal activity or change in sleep pattern.     Exam:   BP 134/78   Ht 5\' 5"  (1.651 m)   Wt 270 lb (122.5 kg)   BMI 44.93 kg/m   Body mass index is 44.93 kg/m.  General appearance : Well developed well nourished female. No acute distress HEENT: Eyes: no retinal hemorrhage or exudates,  Neck supple, trachea midline, no carotid bruits, no thyroidmegaly Lungs: Clear to auscultation, no rhonchi or wheezes, or rib retractions  Heart: Regular rate and rhythm, no murmurs or gallops Breast:Examined in sitting and supine position were symmetrical in appearance, no palpable masses or tenderness,  no skin retraction, no nipple inversion, no nipple discharge, no skin discoloration, no axillary or supraclavicular lymphadenopathy Abdomen: no palpable masses or tenderness, no rebound or guarding Extremities: no edema or skin discoloration or tenderness  Pelvic: Vulva: Normal             Vagina: No gross lesions or discharge  Cervix: No gross lesions or discharge  Uterus  AV, normal size, shape and consistency, non-tender and mobile  Adnexa  Without masses or tenderness  Anus: Normal   Assessment/Plan:  68 y.o. female for annual exam   1. Well female exam with routine gynecological exam Normal gynecologic exam.  Pap test May 2019 was negative, no indication to repeat  this year.  Breast exam normal.  Screening mammogram February 2020 negative.  Health labs with family physician.  Scheduling colonoscopy now.  2. Postmenopause Well on no hormone replacement therapy.  No postmenopausal bleeding since investigation July 2019 when the endometrial lining was thin at 4.3 mm.  3. Screening for osteoporosis Last bone density 5 years ago was normal.  We will repeat a bone density at [facility] now.  Vitamin D supplements, calcium intake of 1200 mg daily and weightbearing physical activity as much as possible. - DG Bone Density;  Future  4. Class 3 severe obesity due to excess calories with serious comorbidity and body mass index (BMI) of 40.0 to 44.9 in adult [facility]) Recommend a lower calorie/carb diet such as [employer].  Aerobic activities 5 times a week and weightlifting every 2 days.  Princess Bruins MD, 12:26 PM 02/18/2019

## 2019-02-20 DIAGNOSIS — J449 Chronic obstructive pulmonary disease, unspecified: Secondary | ICD-10-CM | POA: Diagnosis not present

## 2019-03-08 ENCOUNTER — Other Ambulatory Visit: Payer: Self-pay | Admitting: Family Medicine

## 2019-03-08 MED ORDER — HYDROCODONE-ACETAMINOPHEN 7.5-325 MG PO TABS: 1.0000 | ORAL_TABLET | Freq: Two times a day (BID) | ORAL | 0 refills | Status: DC | PRN

## 2019-03-08 NOTE — Telephone Encounter (Signed)
Patient needs refill on hydrocodone  °[facility] [place]   °

## 2019-03-08 NOTE — Telephone Encounter (Signed)
Ok to refill??  Last office visit 01/23/2019.  Last refill 02/05/2019.

## 2019-03-12 ENCOUNTER — Other Ambulatory Visit: Payer: Self-pay | Admitting: Family Medicine

## 2019-03-21 ENCOUNTER — Ambulatory Visit (INDEPENDENT_AMBULATORY_CARE_PROVIDER_SITE_OTHER): Payer: Medicare Other | Admitting: Podiatry

## 2019-03-21 ENCOUNTER — Other Ambulatory Visit: Payer: Self-pay

## 2019-03-21 DIAGNOSIS — I872 Venous insufficiency (chronic) (peripheral): Secondary | ICD-10-CM

## 2019-03-21 NOTE — Progress Notes (Signed)
Subjective:  Patient ID: [name], female    DOB: August 15, 1950,  MRN: [id number]  Chief Complaint  Patient presents with  . Foot Pain    Pt states bilateral dorsal foot pain accompanied by swelling 26mo duration. No known injury.  . Nail Problem    Nail trim 1-5 bilateral    68 y.o. female presents with the above complaint.    Review of Systems: Negative except as noted in the HPI. Denies N/V/F/Ch.  Past Medical History:  Diagnosis Date  . Achilles tendinitis   . Anxiety   . Asthma   . Depression   . GERD (gastroesophageal reflux disease)   . Gout   . H/O myasthenia gravis    left eye  . HTN (hypertension)   . Hyperlipidemia   . Low back pain   . Obesity hypoventilation syndrome ([place])   . Obstructive sleep apnea   . Osteoarthritis     Current Outpatient Medications:  .  acetaminophen (TYLENOL) 500 MG tablet, Take 1,000 mg by mouth every 6 (six) hours as needed for mild pain. , Disp: , Rfl:  .  allopurinol (ZYLOPRIM) 100 MG tablet, Take 1 tablet (100 mg total) by mouth daily., Disp: 90 tablet, Rfl: 3 .  diphenhydrAMINE (BENADRYL) 25 MG tablet, Take 25 mg by mouth daily. , Disp: , Rfl:  .  fluticasone (CUTIVATE) 0.05 % cream, APPLY EXTERNALLY TO THE AFFECTED AREA DAILY (Patient taking differently: Apply 1 application topically daily as needed (rash). ), Disp: 30 g, Rfl: 3 .  Magnesium 250 MG TABS, Take 1 tablet (250 mg total) by mouth daily. (Patient taking differently: Take 250 mg by mouth daily in the afternoon. Midday), Disp: 30 tablet, Rfl: 11 .  OXYGEN, Inhale 1 L into the lungs at bedtime. IN ADDITION TO CPAP NIGHTLY, Disp: , Rfl:  .  albuterol (VENTOLIN HFA) 108 (90 Base) MCG/ACT inhaler, INHALE 2 PUFFS INTO THE LUNGS EVERY 4 HOURS AS NEEDED FOR WHEEZING OR SHORTNESS OF BREATH, Disp: 8.5 g, Rfl: 2 .  aspirin EC 81 MG tablet, Take 1 tablet (81 mg total) by mouth every evening., Disp:  , Rfl:  .  atorvastatin (LIPITOR) 40 MG tablet, Take 1 tablet (40 mg total) by  mouth daily at 6 PM. (Patient taking differently: Take 40 mg by mouth daily. ), Disp: 90 tablet, Rfl: 1 .  clotrimazole (LOTRIMIN) 1 % cream, Apply 1 application topically 2 (two) times daily., Disp: 30 g, Rfl: 1 .  furosemide (LASIX) 20 MG tablet, Take 1 tablet (20 mg total) by mouth daily. TAKE 1 TABLET(20 MG) BY MOUTH DAILY FOR SWELLIN, Disp: 90 tablet, Rfl: 0 .  HYDROcodone-acetaminophen (NORCO) 7.5-325 MG tablet, Take 1 tablet by mouth 2 (two) times daily as needed for moderate pain., Disp: 60 tablet, Rfl: 0 .  losartan (COZAAR) 50 MG tablet, TAKE 1 TABLET BY MOUTH DAILY, Disp: 90 tablet, Rfl: 3 .  nystatin (MYCOSTATIN/NYSTOP) powder, Apply 1 application topically 3 (three) times daily., Disp: 60 g, Rfl: 1 .  omeprazole (PRILOSEC) 40 MG capsule, TAKE 1 CAPSULE(40 MG) BY MOUTH DAILY (Patient taking differently: Take 40 mg by mouth every evening. ), Disp: 90 capsule, Rfl: 3 .  pyridostigmine (MESTINON) 180 MG CR tablet, Take 1 tablet (180 mg total) by mouth at bedtime., Disp: 90 tablet, Rfl: 4 .  pyridostigmine (MESTINON) 60 MG tablet, Take 1 tablet (60 mg total) by mouth 2 (two) times daily., Disp: 180 tablet, Rfl: 4 .  traZODone (DESYREL) 100 MG tablet, TAKE  1 tablet BY MOUTH AT BEDTIME AS NEEDED FOR SLEEP, Disp: 30 tablet, Rfl: 2  Social History   Tobacco Use  Smoking Status Former Smoker  . Packs/day: 2.00  . Years: 36.00  . Pack years: 72.00  . Types: Cigarettes  . Start date: 06/25/1969  . Quit date: 03/25/2005  . Years since quitting: 14.8  Smokeless Tobacco Never Used    No Known Allergies Objective:   Vitals:   02/07/19 1034  Temp: 98.1 F (36.7 C)   There is no height or weight on file to calculate BMI. Constitutional Well developed. Well nourished.  Vascular Dorsalis pedis pulses palpable bilaterally. Posterior tibial pulses palpable bilaterally. Capillary refill normal to all digits.  No cyanosis or clubbing noted. Pedal hair growth normal. Pitting edema bilat   Neurologic Normal speech. Oriented to person, place, and time. Epicritic sensation to light touch grossly present bilaterally.  Dermatologic Nails thickend and dystrophic No open wounds. No skin lesions.  Orthopedic: No pain to palpation   Radiographs: none Assessment:   1. Pain in left foot   2. Pain in right foot   3. Onychomycosis   4. Venous (peripheral) insufficiency    Plan:  Patient was evaluated and treated and all questions answered.  Onychomycosis -Nails debrided x10  Venous Insufficiency -Compression socks  No follow-ups on file.

## 2019-03-23 DIAGNOSIS — J449 Chronic obstructive pulmonary disease, unspecified: Secondary | ICD-10-CM | POA: Diagnosis not present

## 2019-03-25 ENCOUNTER — Other Ambulatory Visit: Payer: Self-pay | Admitting: Family Medicine

## 2019-03-25 NOTE — Progress Notes (Signed)
Subjective:  Patient ID: [name], female    DOB: June 05, 1951,  MRN: [id number]  Chief Complaint  Patient presents with  . Foot Pain    bilateral foot pain f/u, pt states that she is doing a lot better since the last time she was here, pain has been on and off. pt states that the pain is a 5 out of 10 on the painb scale, pt has also gotten compression stockings, pt also states that the soft cast has helped as well    68 y.o. female presents with the above complaint. The patient presents for followup of bilateral foot pain.  States that the pain is doing better.  Still comes and goes with the pain rated at 5/10.  States that the soft cast that were applied in last visit have helped.  States she has gotten compression stocking which have helped with the pain.      Review of Systems: Negative except as noted in the HPI. Denies N/V/F/Ch.  Past Medical History:  Diagnosis Date  . Achilles tendinitis   . Anxiety   . Asthma   . Depression   . GERD (gastroesophageal reflux disease)   . Gout   . H/O myasthenia gravis    left eye  . HTN (hypertension)   . Hyperlipidemia   . Low back pain   . Obesity hypoventilation syndrome ([place])   . Obstructive sleep apnea   . Osteoarthritis     Current Outpatient Medications:  .  acetaminophen (TYLENOL) 500 MG tablet, Take 1,000 mg by mouth every 6 (six) hours as needed for mild pain. , Disp: , Rfl:  .  albuterol (PROVENTIL HFA;VENTOLIN HFA) 108 (90 Base) MCG/ACT inhaler, Inhale 2 puffs into the lungs every 4 (four) hours as needed for wheezing or shortness of breath., Disp: 1 Inhaler, Rfl: 5 .  allopurinol (ZYLOPRIM) 100 MG tablet, Take 1 tablet (100 mg total) by mouth daily., Disp: 90 tablet, Rfl: 3 .  aspirin EC 81 MG tablet, Take 81 mg by mouth every evening. , Disp: , Rfl:  .  atorvastatin (LIPITOR) 20 MG tablet, Take 1 tablet (20 mg total) by mouth daily at 6 PM., Disp: 90 tablet, Rfl: 3 .  diphenhydrAMINE (BENADRYL) 25 MG tablet, Take  25 mg by mouth every morning. , Disp: , Rfl:  .  fluticasone (CUTIVATE) 0.05 % cream, APPLY EXTERNALLY TO THE AFFECTED AREA DAILY, Disp: 30 g, Rfl: 3 .  furosemide (LASIX) 20 MG tablet, TAKE 1 TABLET(20 MG) BY MOUTH DAILY FOR SWELLING, Disp: 90 tablet, Rfl: 0 .  HYDROcodone-acetaminophen (NORCO) 7.5-325 MG tablet, Take 1 tablet by mouth 2 (two) times daily as needed for moderate pain., Disp: 60 tablet, Rfl: 0 .  losartan (COZAAR) 50 MG tablet, TAKE 1 TABLET BY MOUTH DAILY, Disp: 90 tablet, Rfl: 3 .  Magnesium 250 MG TABS, Take 1 tablet (250 mg total) by mouth daily., Disp: 30 tablet, Rfl: 11 .  omeprazole (PRILOSEC) 40 MG capsule, TAKE 1 CAPSULE(40 MG) BY MOUTH DAILY, Disp: 30 capsule, Rfl: 3 .  OXYGEN, Inhale 1 L into the lungs at bedtime. IN ADDITION TO CPAP NIGHTLY, Disp: , Rfl:  .  pyridostigmine (MESTINON) 60 MG tablet, Take 1 tablet (60 mg total) by mouth 3 (three) times daily., Disp: 270 tablet, Rfl: 4 .  traZODone (DESYREL) 50 MG tablet, TAKE 1/2 TO 1 TABLET(25 TO 50 MG) BY MOUTH AT BEDTIME AS NEEDED FOR SLEEP, Disp: 90 tablet, Rfl: 0  Social History   Tobacco  Use  Smoking Status Former Smoker  . Packs/day: 2.00  . Years: 36.00  . Pack years: 72.00  . Types: Cigarettes  . Start date: 06/25/1969  . Quit date: 03/25/2005  . Years since quitting: 14.0  Smokeless Tobacco Never Used    No Known Allergies Objective:  There were no vitals filed for this visit. There is no height or weight on file to calculate BMI. Constitutional Well developed. Well nourished.  Vascular Dorsalis pedis pulses palpable bilaterally. Posterior tibial pulses palpable bilaterally. Capillary refill normal to all digits.  No cyanosis or clubbing noted. Pedal hair growth normal.  Localized pitting edema to the anterior tibial crest bilaterally.  Neurologic Normal speech. Oriented to person, place, and time. Epicritic sensation to light touch grossly present bilaterally.  Dermatologic Nails well groomed  and normal in appearance. No open wounds. No skin lesions.  Orthopedic: Normal joint ROM without pain or crepitus bilaterally. No visible deformities. No bony tenderness.   Radiographs: none Assessment:   1. Venous (peripheral) insufficiency    Plan:  Patient was evaluated and treated and all questions answered.  Venous insufficiency   - Pain improved.  I think that her pain is likely to be controlled with her compression stocks that she has purchased.  We discussed continue use of these compressions stocks.  Discussed the patient she should followup should her pain persists.  The patient was okay with this plan.  Followup as needed.

## 2019-04-09 ENCOUNTER — Other Ambulatory Visit: Payer: Self-pay | Admitting: Family Medicine

## 2019-04-09 MED ORDER — HYDROCODONE-ACETAMINOPHEN 7.5-325 MG PO TABS: 1.0000 | ORAL_TABLET | Freq: Two times a day (BID) | ORAL | 0 refills | Status: DC | PRN

## 2019-04-09 NOTE — Telephone Encounter (Signed)
Ok to refill??  Last office visit 01/23/2019.  Last refill 03/08/2019.

## 2019-04-09 NOTE — Telephone Encounter (Signed)
Patient needs refill on hydrocodone  [phone]

## 2019-04-09 NOTE — Telephone Encounter (Signed)
Requested Prescriptions   Pending Prescriptions Disp Refills  . HYDROcodone-acetaminophen (NORCO) 7.5-325 MG tablet 60 tablet 0    Sig: Take 1 tablet by mouth 2 (two) times daily as needed for moderate pain.    Last OV 01/23/2019  Last written 03/08/2019

## 2019-04-23 DIAGNOSIS — J449 Chronic obstructive pulmonary disease, unspecified: Secondary | ICD-10-CM | POA: Diagnosis not present

## 2019-05-03 ENCOUNTER — Ambulatory Visit: Payer: Medicare Other | Admitting: Family Medicine

## 2019-05-08 ENCOUNTER — Other Ambulatory Visit: Payer: Self-pay

## 2019-05-08 MED ORDER — HYDROCODONE-ACETAMINOPHEN 7.5-325 MG PO TABS: 1.0000 | ORAL_TABLET | Freq: Two times a day (BID) | ORAL | 0 refills | Status: DC | PRN

## 2019-05-08 NOTE — Telephone Encounter (Signed)
Requested Prescriptions   Pending Prescriptions Disp Refills  . HYDROcodone-acetaminophen (NORCO) 7.5-325 MG tablet 60 tablet 0    Sig: Take 1 tablet by mouth 2 (two) times daily as needed for moderate pain.    Last OV 01/23/2019  Last written 04/09/2019

## 2019-05-12 ENCOUNTER — Other Ambulatory Visit: Payer: Self-pay | Admitting: Family Medicine

## 2019-05-13 ENCOUNTER — Other Ambulatory Visit: Payer: Self-pay

## 2019-05-13 MED ORDER — TRAZODONE HCL 50 MG PO TABS: ORAL_TABLET | ORAL | 0 refills | Status: DC

## 2019-05-13 NOTE — Telephone Encounter (Signed)
Requested Prescriptions   Pending Prescriptions Disp Refills  . traZODone (DESYREL) 50 MG tablet 90 tablet 0    Sig: TAKE 1/2 TO 1 TABLET(25 TO 50 MG) BY MOUTH AT BEDTIME AS NEEDED FOR SLEEP.    Last OV 01/23/2019  Last written 02/01/2019

## 2019-05-14 ENCOUNTER — Other Ambulatory Visit: Payer: Self-pay

## 2019-05-15 ENCOUNTER — Encounter: Payer: Self-pay | Admitting: Family Medicine

## 2019-05-15 ENCOUNTER — Ambulatory Visit (INDEPENDENT_AMBULATORY_CARE_PROVIDER_SITE_OTHER): Payer: Medicare Other | Admitting: Family Medicine

## 2019-05-15 VITALS — BP 128/72 | HR 84 | Temp 98.8°F | Resp 16 | Ht 65.0 in | Wt 275.0 lb

## 2019-05-15 DIAGNOSIS — E785 Hyperlipidemia, unspecified: Secondary | ICD-10-CM

## 2019-05-15 DIAGNOSIS — J4520 Mild intermittent asthma, uncomplicated: Secondary | ICD-10-CM | POA: Diagnosis not present

## 2019-05-15 DIAGNOSIS — Z23 Encounter for immunization: Secondary | ICD-10-CM

## 2019-05-15 DIAGNOSIS — I10 Essential (primary) hypertension: Secondary | ICD-10-CM

## 2019-05-15 DIAGNOSIS — M8949 Other hypertrophic osteoarthropathy, multiple sites: Secondary | ICD-10-CM | POA: Diagnosis not present

## 2019-05-15 DIAGNOSIS — K219 Gastro-esophageal reflux disease without esophagitis: Secondary | ICD-10-CM

## 2019-05-15 DIAGNOSIS — R252 Cramp and spasm: Secondary | ICD-10-CM | POA: Diagnosis not present

## 2019-05-15 DIAGNOSIS — M159 Polyosteoarthritis, unspecified: Secondary | ICD-10-CM

## 2019-05-15 MED ORDER — OMEPRAZOLE 40 MG PO CPDR: DELAYED_RELEASE_CAPSULE | ORAL | 3 refills | Status: DC

## 2019-05-15 MED ORDER — ALBUTEROL SULFATE HFA 108 (90 BASE) MCG/ACT IN AERS: 2.0000 | INHALATION_SPRAY | RESPIRATORY_TRACT | 2 refills | Status: DC | PRN

## 2019-05-15 NOTE — Progress Notes (Signed)
   Subjective:    Patient ID: [name], female    DOB: [birth year], 68 y.o.   MRN: [id number]  Patient presents for Follow-up (is not fasting) Patient here to follow-up chronic medical problems.  Medications reviewed.  Hypertension she is taking medications as prescribed has not any side effects.  Neck pain from osteoarthritis she is taking her hydrocodone acetaminophen  Morbid obesity- weight down 5lbs   Since her last visit she has been seen by podiatry, gynecology and neurology for her myasthenia  Hyperlipidemia back in June her Lipitor was increased to 20 mg at bedtime triglycerides were 212 LDL 104 goals keep her LDL less than 100  Due for flu shot Review Of Systems:  GEN- denies fatigue, fever, weight loss,weakness, recent illness HEENT- denies eye drainage, change in vision, nasal discharge, CVS- denies chest pain, palpitations RESP- denies SOB, cough, wheeze ABD- denies N/V, change in stools, abd pain GU- denies dysuria, hematuria, dribbling, incontinence MSK- + joint pain, muscle aches, injury Neuro- denies headache, dizziness, syncope, seizure activity       Objective:    BP 128/72   Pulse 84   Temp 98.8 F (37.1 C) (Oral)   Resp 16   Ht 5\' 5"  (1.651 m)   Wt 275 lb (124.7 kg)   SpO2 97%   BMI 45.76 kg/m  GEN- NAD, alert and oriented x3 HEENT- PERRL, EOMI, non injected sclera, pink conjunctiva, MMM, oropharynx clear Neck- Supple, no thyromegaly CVS- RRR, no murmur RESP-CTAB ABD-NABS,soft,NT,ND EXT- trace bilat pedal  edema Pulses- Radial, DP- 2+        Assessment & Plan:      Problem List Items Addressed This Visit      Unprioritized   Asthma    Prn albuterol, flu shot given       Relevant Medications   albuterol (VENTOLIN HFA) 108 (90 Base) MCG/ACT inhaler   Essential hypertension - Primary    Well controlled no changes Pt working on dietary changes       Relevant Orders   Comprehensive metabolic panel   Lipid panel   GERD     Continue with PPI, continues to benefit from this       Relevant Medications   omeprazole (PRILOSEC) 40 MG capsule   Hyperlipidemia    Recheck LFT and lipids, increased statin in June      Morbid obesity ([place])    Working on dietary changes       Osteoarthritis    Pain medication refilled       Other Visit Diagnoses    Leg cramps       Relevant Orders   Magnesium      Note: This dictation was prepared with Dragon dictation along with smaller Company secretary. Any transcriptional errors that result from this process are unintentional.

## 2019-05-15 NOTE — Assessment & Plan Note (Signed)
Well controlled no changes Pt working on dietary changes

## 2019-05-15 NOTE — Assessment & Plan Note (Signed)
Pain medication refilled

## 2019-05-15 NOTE — Assessment & Plan Note (Signed)
Recheck LFT and lipids, increased statin in June

## 2019-05-15 NOTE — Assessment & Plan Note (Signed)
Working on dietary changes

## 2019-05-15 NOTE — Assessment & Plan Note (Signed)
Prn albuterol, flu shot given

## 2019-05-15 NOTE — Patient Instructions (Signed)
F/u 4 months 

## 2019-05-15 NOTE — Assessment & Plan Note (Signed)
Continue with PPI, continues to benefit from this

## 2019-05-16 LAB — COMPREHENSIVE METABOLIC PANEL
AG Ratio: 1.2 (calc) (ref 1.0–2.5)
ALT: 11 U/L (ref 6–29)
AST: 12 U/L (ref 10–35)
Albumin: 4 g/dL (ref 3.6–5.1)
Alkaline phosphatase (APISO): 86 U/L (ref 37–153)
BUN/Creatinine Ratio: 11 (calc) (ref 6–22)
BUN: 11 mg/dL (ref 7–25)
CO2: 30 mmol/L (ref 20–32)
Calcium: 9.3 mg/dL (ref 8.6–10.4)
Chloride: 104 mmol/L (ref 98–110)
Creat: 1.01 mg/dL — ABNORMAL HIGH (ref 0.50–0.99)
Globulin: 3.3 g/dL (calc) (ref 1.9–3.7)
Glucose, Bld: 141 mg/dL — ABNORMAL HIGH (ref 65–99)
Potassium: 4.9 mmol/L (ref 3.5–5.3)
Sodium: 143 mmol/L (ref 135–146)
Total Bilirubin: 0.6 mg/dL (ref 0.2–1.2)
Total Protein: 7.3 g/dL (ref 6.1–8.1)

## 2019-05-16 LAB — LIPID PANEL
Cholesterol: 164 mg/dL (ref <200)
HDL: 58 mg/dL (ref >=50)
LDL Cholesterol (Calc): 86 mg/dL (calc)
Non-HDL Cholesterol (Calc): 106 mg/dL (calc) (ref <130)
Total CHOL/HDL Ratio: 2.8 (calc) (ref <5.0)
Triglycerides: 104 mg/dL (ref <150)

## 2019-05-16 LAB — MAGNESIUM: Magnesium: 1.8 mg/dL (ref 1.5–2.5)

## 2019-05-17 ENCOUNTER — Encounter: Payer: Self-pay | Admitting: *Deleted

## 2019-05-23 DIAGNOSIS — J449 Chronic obstructive pulmonary disease, unspecified: Secondary | ICD-10-CM | POA: Diagnosis not present

## 2019-06-07 ENCOUNTER — Other Ambulatory Visit: Payer: Self-pay | Admitting: *Deleted

## 2019-06-07 MED ORDER — HYDROCODONE-ACETAMINOPHEN 7.5-325 MG PO TABS: 1.0000 | ORAL_TABLET | Freq: Two times a day (BID) | ORAL | 0 refills | Status: DC | PRN

## 2019-06-07 NOTE — Telephone Encounter (Signed)
Received call from patient.   Requested refill on Hydrocodone/APAP.  Ok to refill??  Last office visit 05/15/2019.  Last refill 05/08/2019.

## 2019-06-23 DIAGNOSIS — J449 Chronic obstructive pulmonary disease, unspecified: Secondary | ICD-10-CM | POA: Diagnosis not present

## 2019-06-24 ENCOUNTER — Other Ambulatory Visit: Payer: Self-pay

## 2019-06-24 MED ORDER — FUROSEMIDE 20 MG PO TABS: ORAL_TABLET | ORAL | 0 refills | Status: DC

## 2019-07-08 ENCOUNTER — Other Ambulatory Visit: Payer: Self-pay | Admitting: *Deleted

## 2019-07-08 IMAGING — MR MR HEAD WO/W CM
9 of 12 series · 36 of 48 positions shown · IV contrast (ml Multihance)
Comparison: 02/06/2012

CLINICAL DATA: "Disorder of eyelid". Blurry vision in the left eye
for 1 week. Reportedly recently told there was a stroke in the eye.
Patient presents the ER for MRI.

EXAM:
MRI HEAD AND ORBITS WITHOUT AND WITH CONTRAST
TECHNIQUE: Multiplanar, multiecho pulse sequences of the brain and surrounding
structures were obtained without and with intravenous contrast.
Multiplanar, multiecho pulse sequences of the orbits and surrounding
structures were obtained including fat saturation techniques, before
and after intravenous contrast administration.
CONTRAST:  20mL MULTIHANCE GADOBENATE DIMEGLUMINE 529 MG/ML IV SOLN

[Series 2: DWI · axial · 3.0mm · 0.74mm/px · z∈[-45,+113]mm · 5 of 54 slices shown (1 of 4)]
[im 1/54]
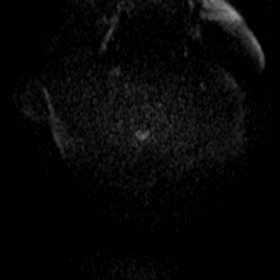
[im 14/54]
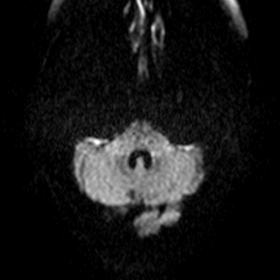
[im 27/54]
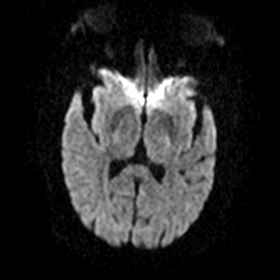
[im 40/54]
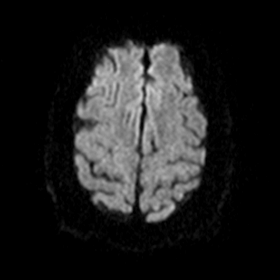
[im 54/54]
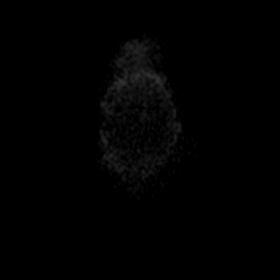

[Series 3: DWI · axial · 3.0mm · 0.82mm/px · z∈[-48,+114]mm · 5 of 53 slices shown (2 of 4)]
[im 1/53]
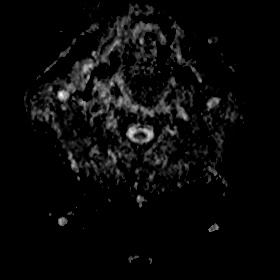
[im 14/53]
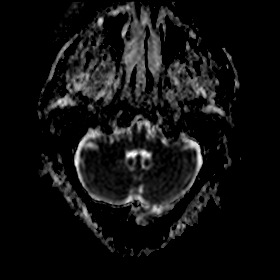
[im 27/53]
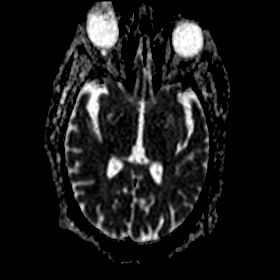
[im 40/53]
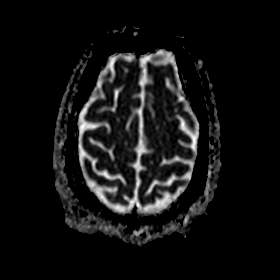
[im 53/53]
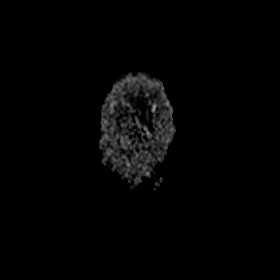

[Series 4: DWI · coronal · 5.0mm · 0.49mm/px · 3 of 34 slices shown (3 of 4)]
[im 1/34]
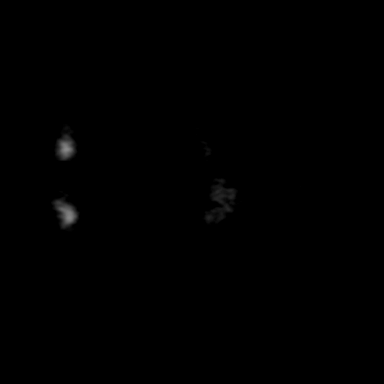
[im 17/34]
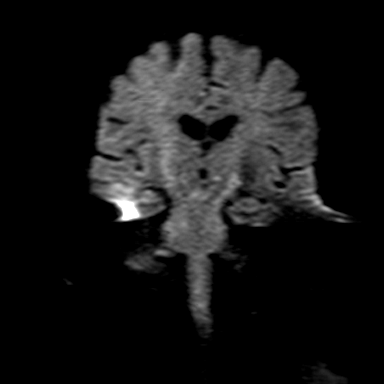
[im 34/34]
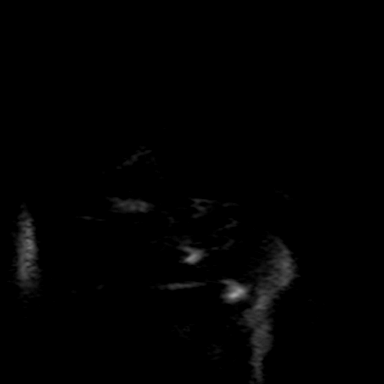

[Series 5: DWI · coronal · 5.0mm · 0.49mm/px · 3 of 34 slices shown (4 of 4)]
[im 1/34]
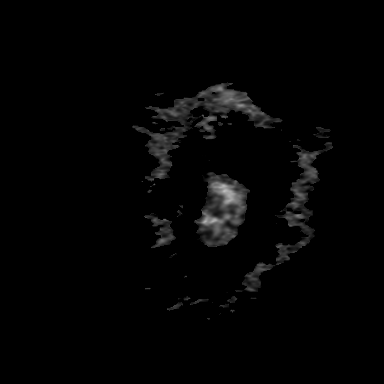
[im 17/34]
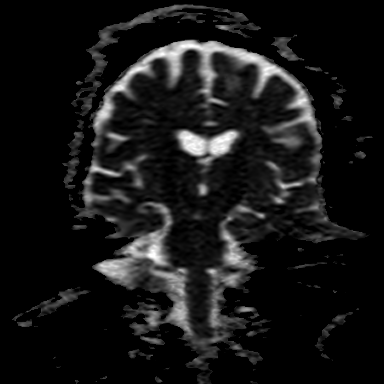
[im 34/34]
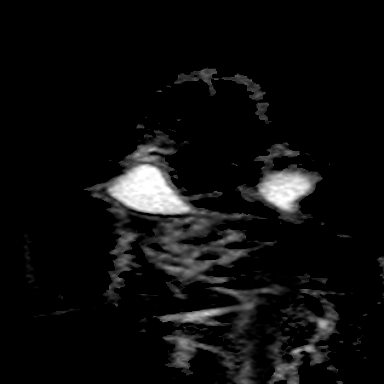

[Series 7: T2 · axial · 5.0mm · 0.67mm/px · z∈[-39,+104]mm · 2 of 23 slices shown (1 of 2)]
[im 1/23]
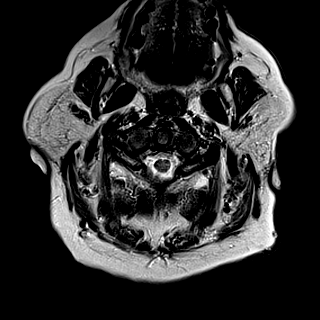
[im 23/23]
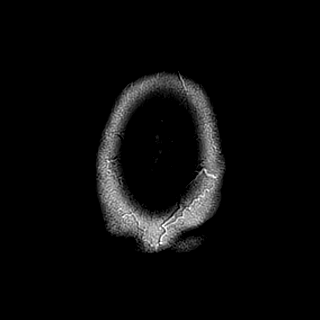

[Series 8: FLAIR · axial · 3.0mm · 0.83mm/px · z∈[-36,+102]mm · 5 of 47 slices shown]
[im 1/47]
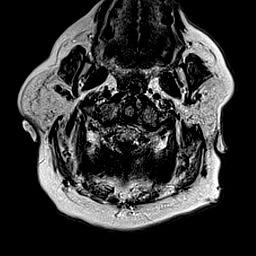
[im 12/47]
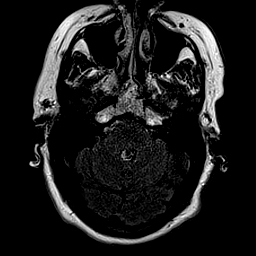
[im 24/47]
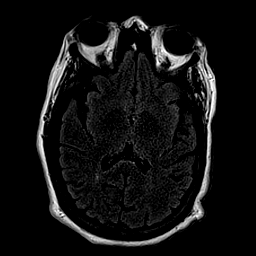
[im 35/47]
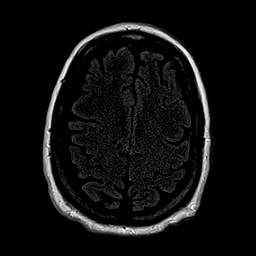
[im 47/47]
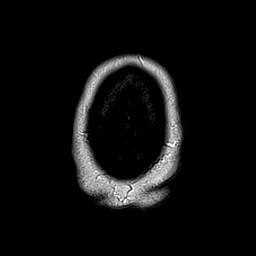

[Series 9: T1 · axial · 2.0mm · 0.42mm/px · z∈[-45,+111]mm · 8 of 79 slices shown]
[im 1/79]
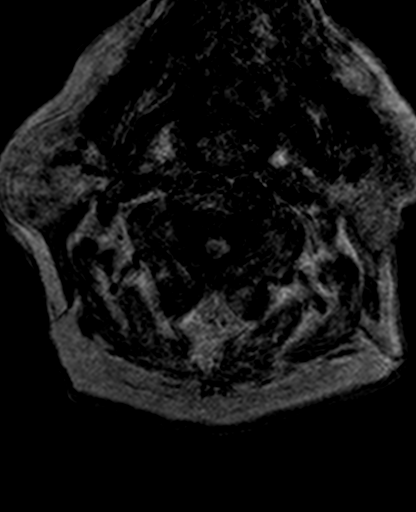
[im 12/79]
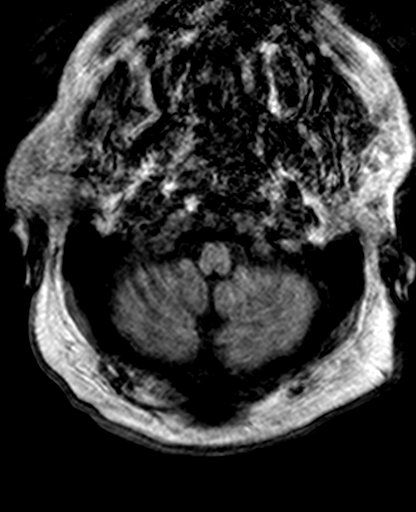
[im 23/79]
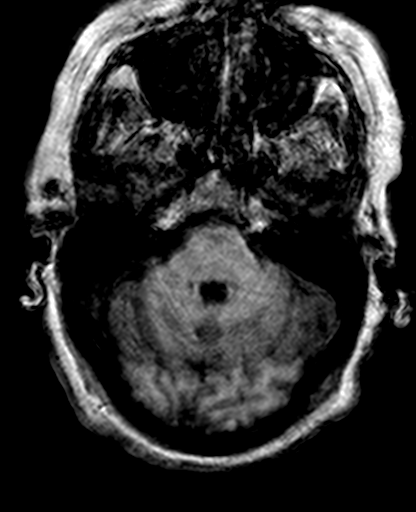
[im 34/79]
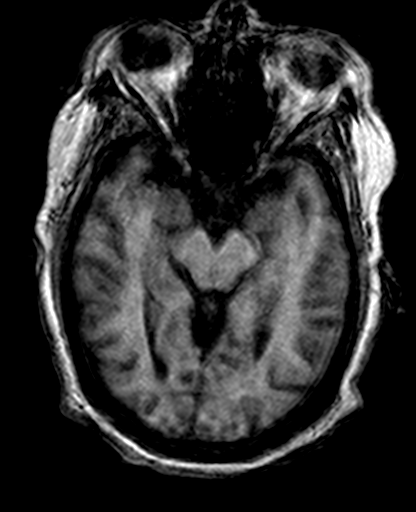
[im 45/79]
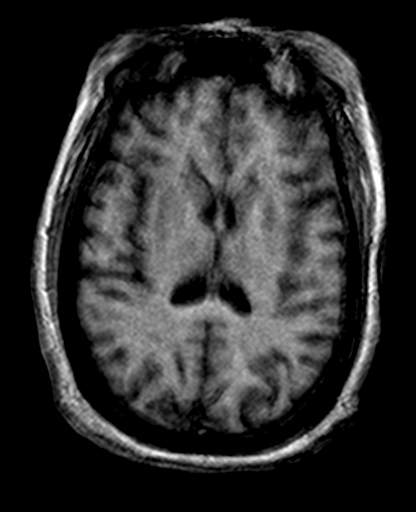
[im 56/79]
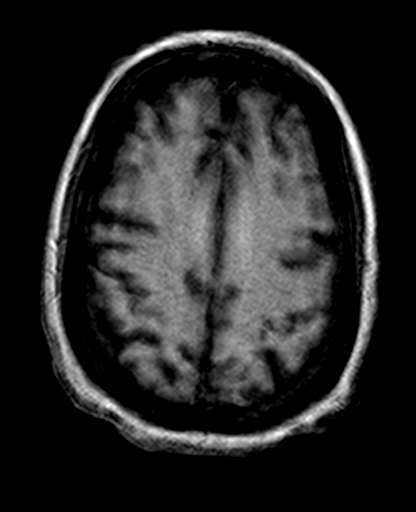
[im 67/79]
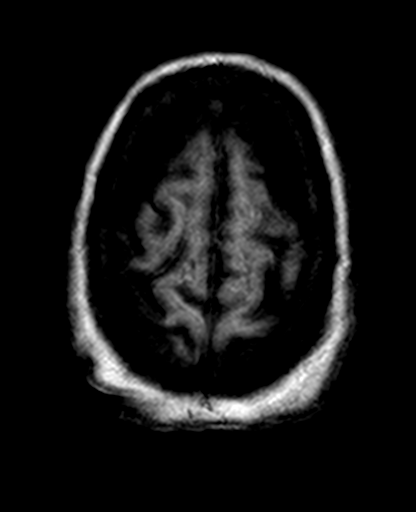
[im 79/79]
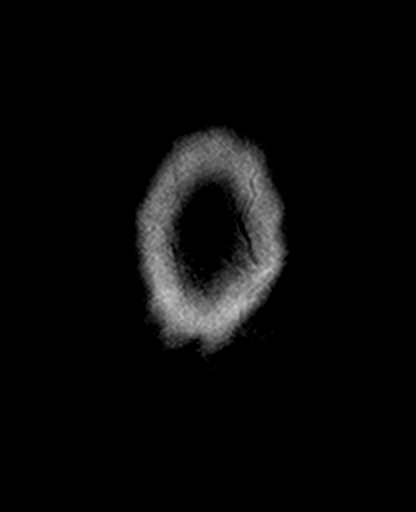

[Series 11: T2 · coronal · 5.0mm · 0.62mm/px · 3 of 28 slices shown (2 of 2)]
[im 1/28]
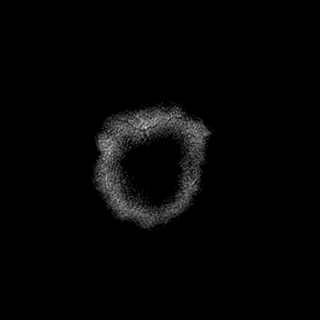
[im 14/28]
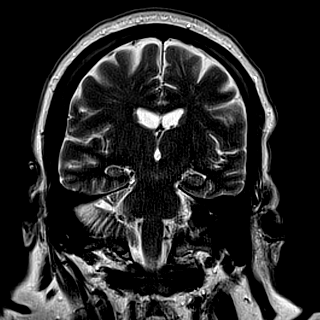
[im 28/28]
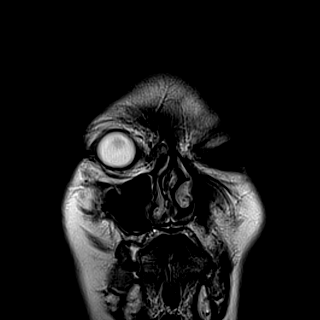

[Series 13: T1 post-contrast · coronal · 5.0mm · 0.43mm/px · 2 of 24 slices shown]
[im 1/24]
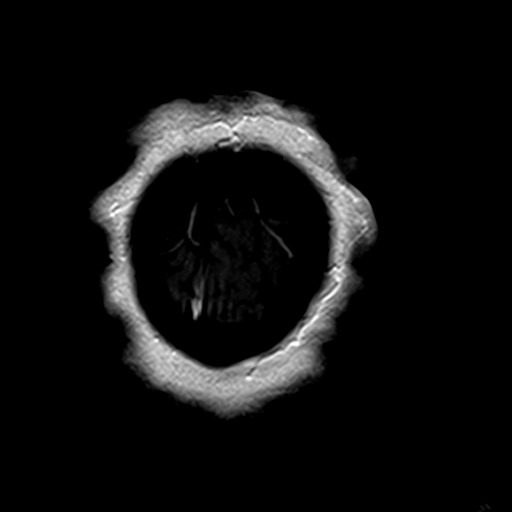
[im 24/24]
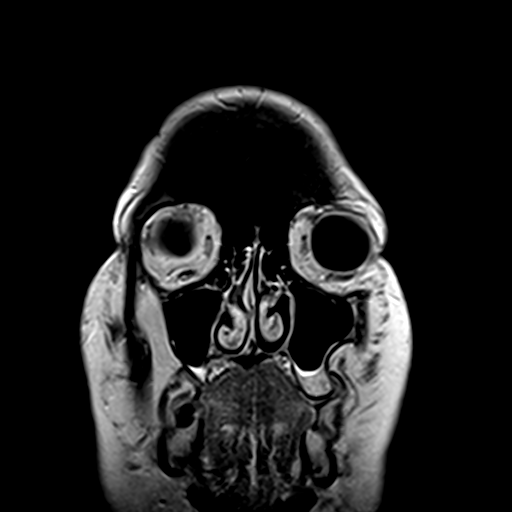

[36 of 48 positions shown; findings below may reference images not displayed]

FINDINGS: MRI HEAD FINDINGS

Brain: No acute infarction, hemorrhage, hydrocephalus, extra-axial
collection or mass lesion. No findings along the optic pathways to
explain the history. Clear suprasellar cistern. Normal brain volume.
Few FLAIR hyperintensities in the cerebral white matter, often seen
in asymptomatic patients of this age.

Vascular: Major flow voids are preserved, including the left ICA. No
abnormal enhancement in the cavernous sinus region.

Skull and upper cervical spine: Negative for marrow lesion.

MRI ORBITS FINDINGS

Orbits: No masslike or inflammatory finding. Globes, optic nerves,
orbital fat, extraocular muscles, vascular structures, and lacrimal
glands are negative. Status post cataract resection on the left.

Visualized sinuses: Clear

Soft tissues: Negative.

Limited intracranial: Clear suprasellar cistern. Normal position of
the optic chiasm. No evidence of mass or inflammation at the optic
canals.
IMPRESSION: No explanation for symptoms. No infarct, compressive lesion, or
neuritis findings.

## 2019-07-08 IMAGING — DX DG CHEST 2V
2 series · 2 of 2 positions shown · non-contrast
Comparison: Chest radiographs 03/18/2008.

CLINICAL DATA: 65-year-old female with abnormal left eye vision and
left eyelid drooping for 1 week.

EXAM:
CHEST  2 VIEW

[chest pa]
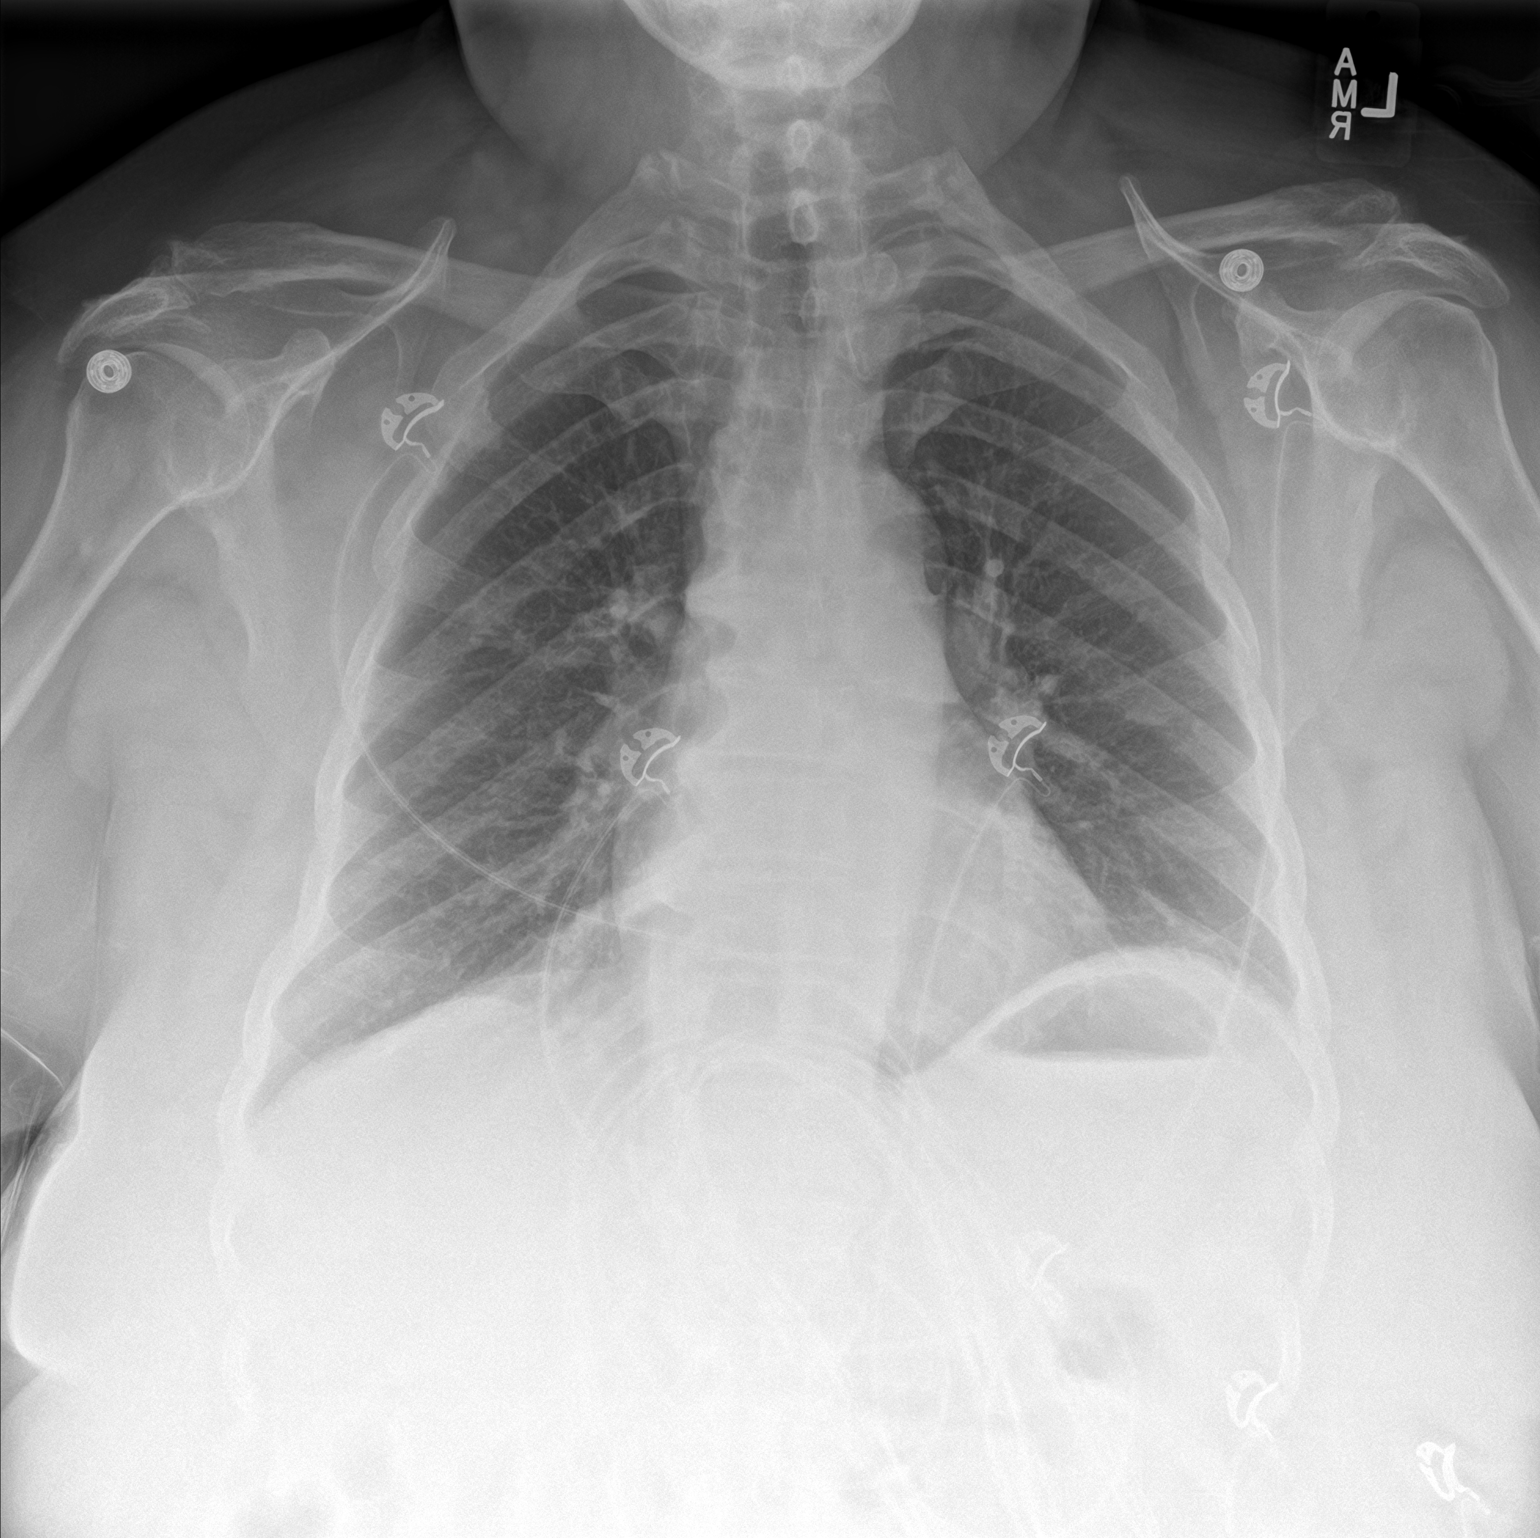

[chest lat]
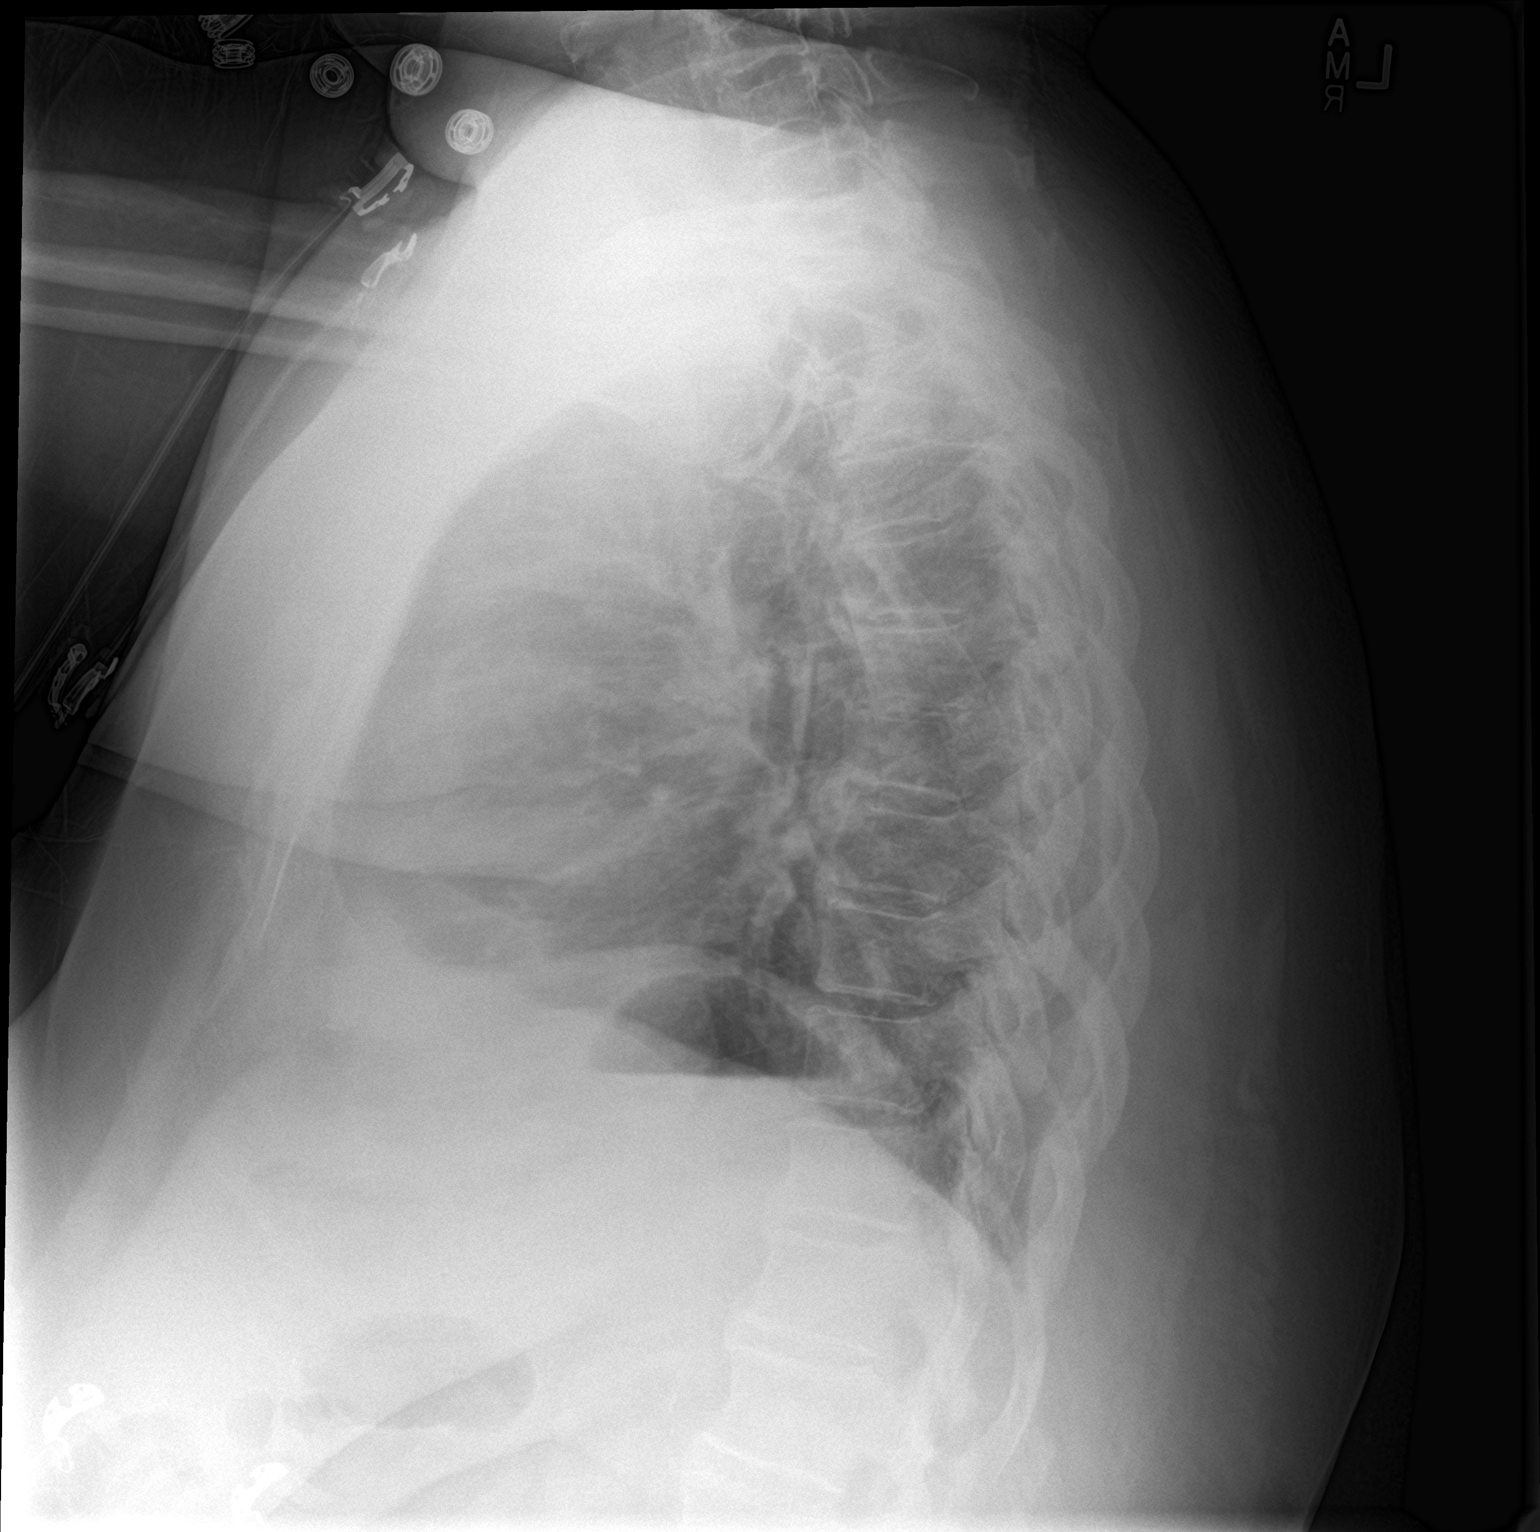

[2 of 2 positions shown; findings below may reference images not displayed]

FINDINGS: Mildly low lung volumes, but similar to 8775. Cardiac size at the
upper limits of normal. Other mediastinal contours are within normal
limits. Visualized tracheal air column is within normal limits. No
pneumothorax, pleural effusion or confluent pulmonary opacity. No
acute osseous abnormality identified. Negative visible bowel gas
pattern.
IMPRESSION: Negative.  No acute cardiopulmonary abnormality.

## 2019-07-08 IMAGING — MR MR ORBITS WO/W CM
4 of 7 series · 19 of 48 positions shown · IV contrast (multihance)
Comparison: 02/06/2012

CLINICAL DATA: "Disorder of eyelid". Blurry vision in the left eye
for 1 week. Reportedly recently told there was a stroke in the eye.
Patient presents the ER for MRI.

EXAM:
MRI HEAD AND ORBITS WITHOUT AND WITH CONTRAST
TECHNIQUE: Multiplanar, multiecho pulse sequences of the brain and surrounding
structures were obtained without and with intravenous contrast.
Multiplanar, multiecho pulse sequences of the orbits and surrounding
structures were obtained including fat saturation techniques, before
and after intravenous contrast administration.
CONTRAST:  20mL MULTIHANCE GADOBENATE DIMEGLUMINE 529 MG/ML IV SOLN

[Series 2: T1 · sagittal · 5.0mm · 0.37mm/px · 3 of 21 slices shown]
[im 5/21]
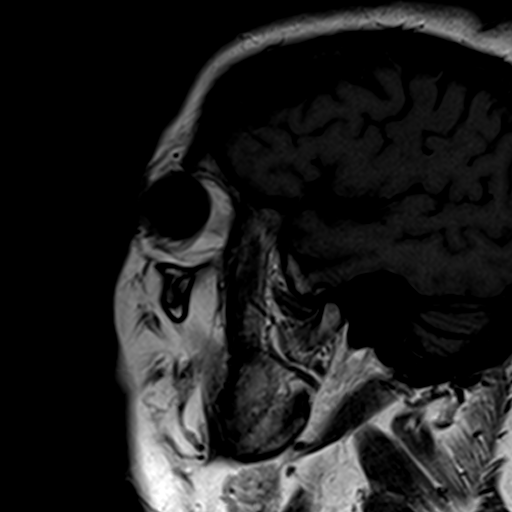
[im 13/21]
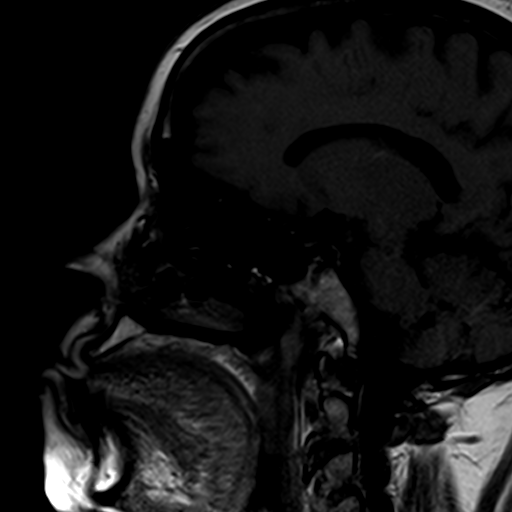
[im 21/21]
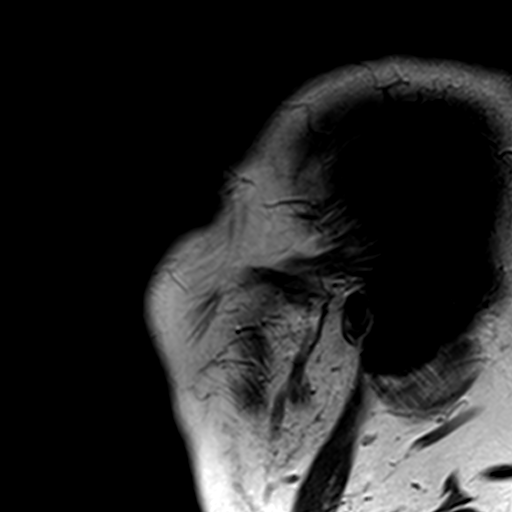

[Series 3: T2 · axial · 3.0mm · 0.32mm/px · z∈[+17,+62]mm · 5 of 15 slices shown (1 of 2)]
[im 1/15]
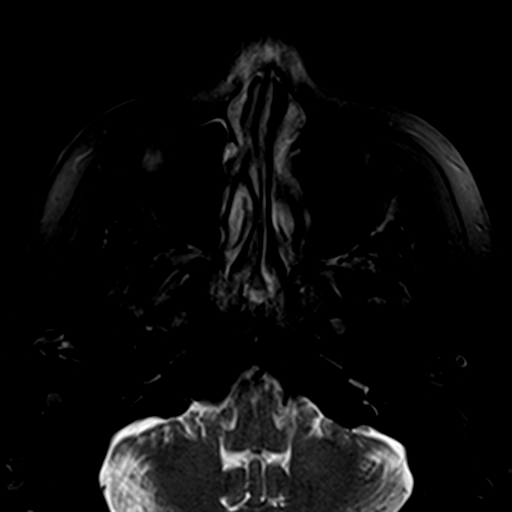
[im 4/15]
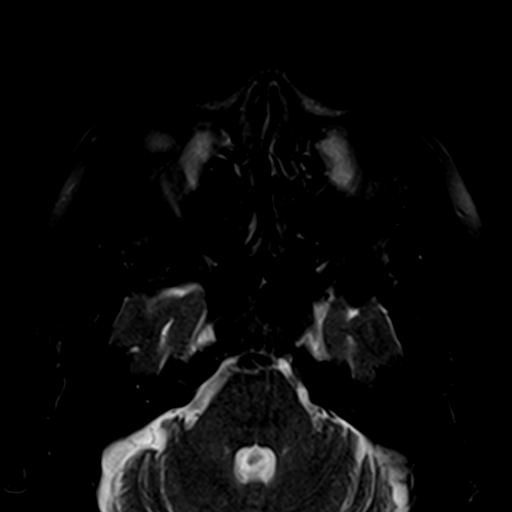
[im 8/15]
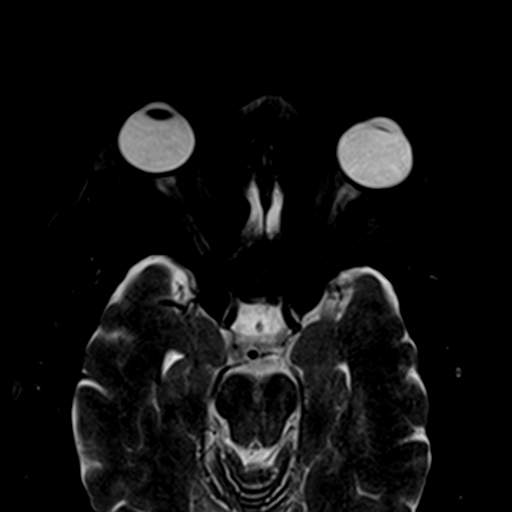
[im 11/15]
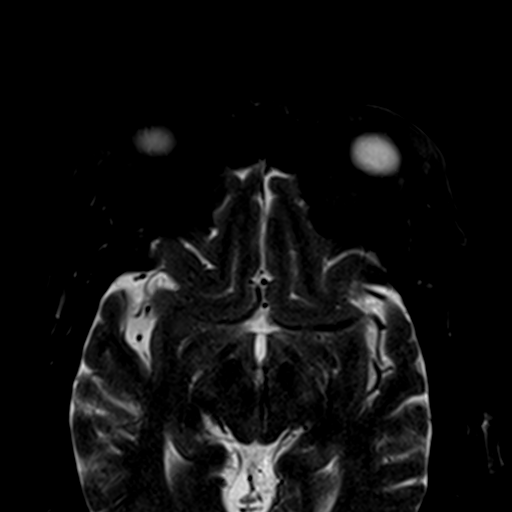
[im 15/15]
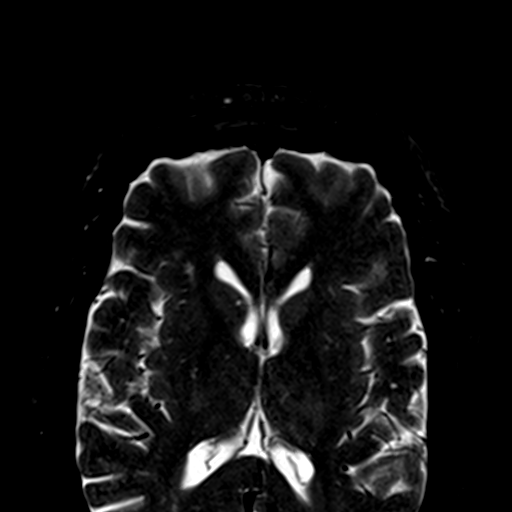

[Series 4: T2 · coronal · 3.0mm · 0.34mm/px · 8 of 27 slices shown (2 of 2)]
[im 1/27]
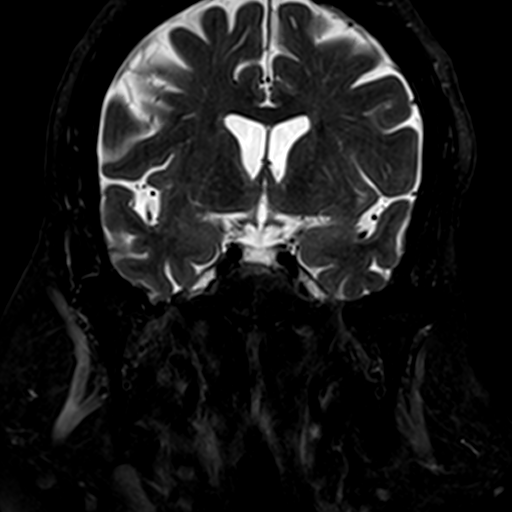
[im 4/27]
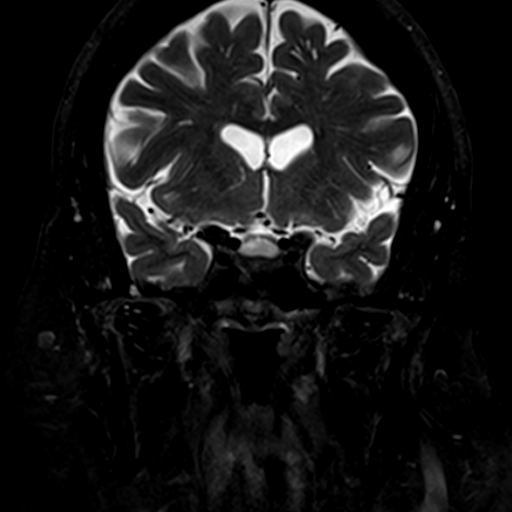
[im 7/27]
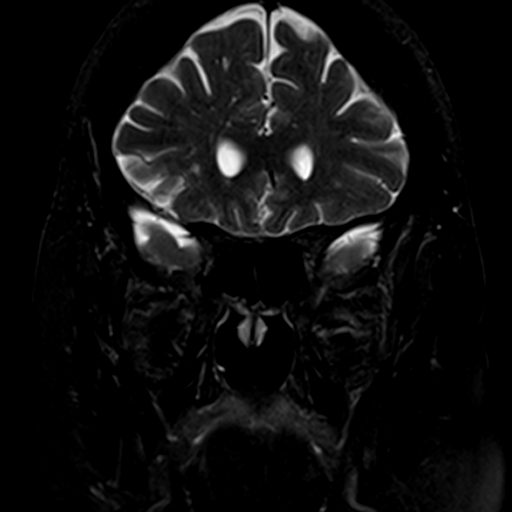
[im 10/27]
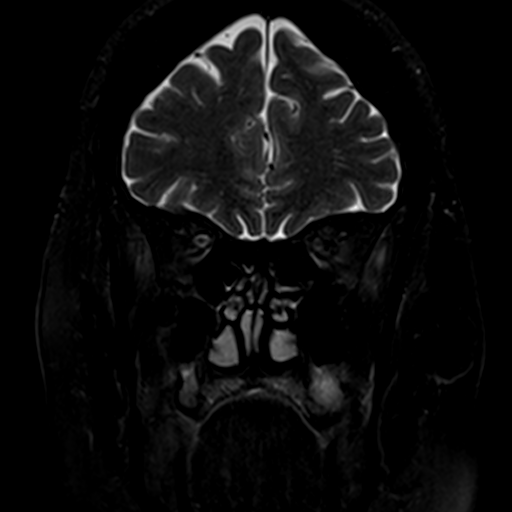
[im 14/27]
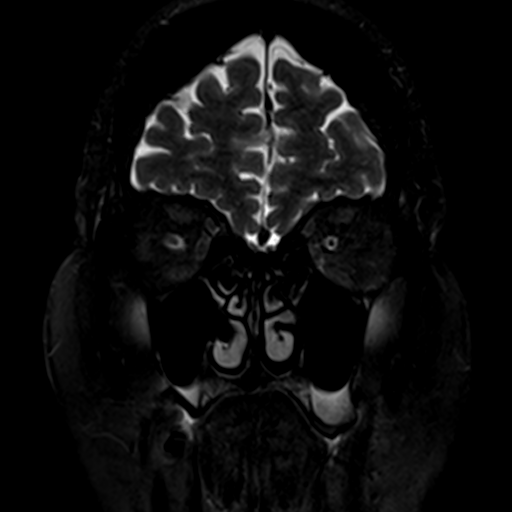
[im 17/27]
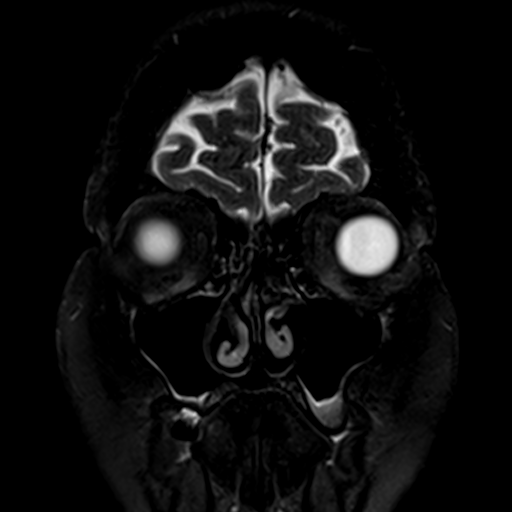
[im 20/27]
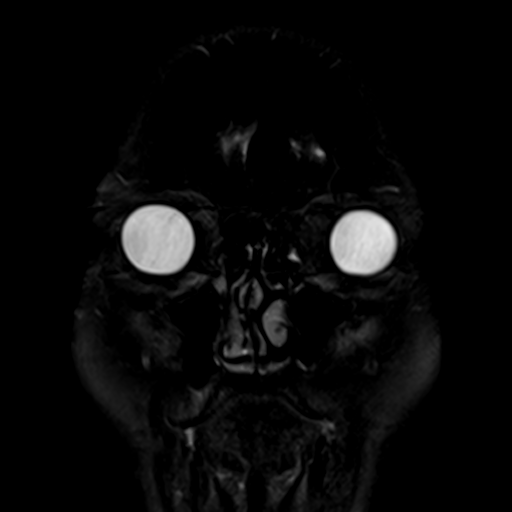
[im 23/27]
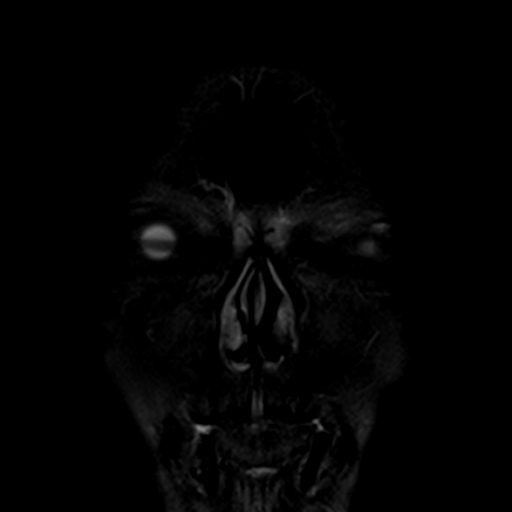

[Series 5: t1_se_ax_3mm · axial · 3.0mm · 0.33mm/px · z∈[+15,+60]mm · 3 of 15 slices shown]
[im 1/15]
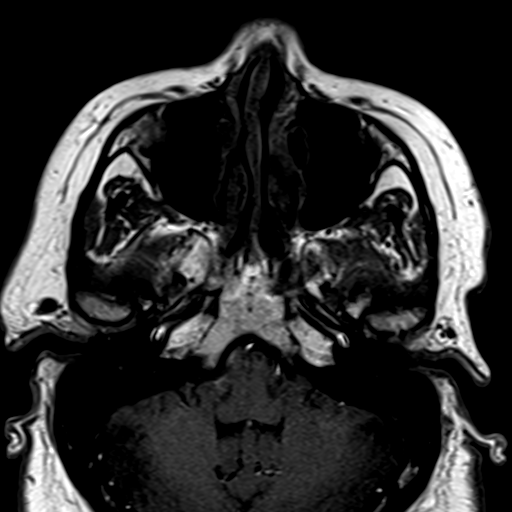
[im 8/15]
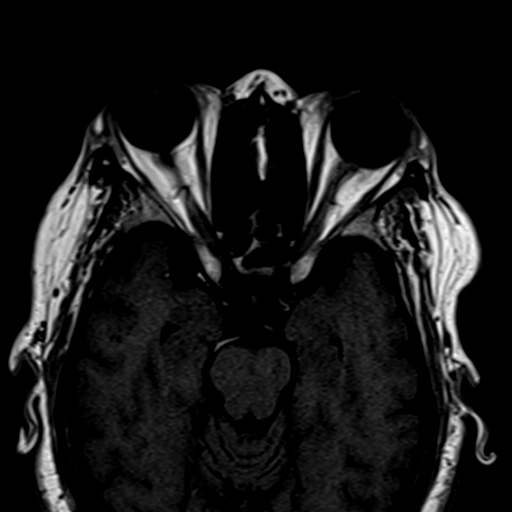
[im 15/15]
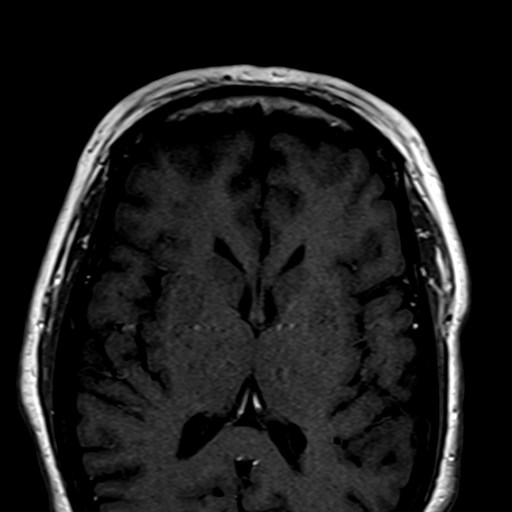

[19 of 48 positions shown; findings below may reference images not displayed]

FINDINGS: MRI HEAD FINDINGS

Brain: No acute infarction, hemorrhage, hydrocephalus, extra-axial
collection or mass lesion. No findings along the optic pathways to
explain the history. Clear suprasellar cistern. Normal brain volume.
Few FLAIR hyperintensities in the cerebral white matter, often seen
in asymptomatic patients of this age.

Vascular: Major flow voids are preserved, including the left ICA. No
abnormal enhancement in the cavernous sinus region.

Skull and upper cervical spine: Negative for marrow lesion.

MRI ORBITS FINDINGS

Orbits: No masslike or inflammatory finding. Globes, optic nerves,
orbital fat, extraocular muscles, vascular structures, and lacrimal
glands are negative. Status post cataract resection on the left.

Visualized sinuses: Clear

Soft tissues: Negative.

Limited intracranial: Clear suprasellar cistern. Normal position of
the optic chiasm. No evidence of mass or inflammation at the optic
canals.
IMPRESSION: No explanation for symptoms. No infarct, compressive lesion, or
neuritis findings.

## 2019-07-08 MED ORDER — HYDROCODONE-ACETAMINOPHEN 7.5-325 MG PO TABS: 1.0000 | ORAL_TABLET | Freq: Two times a day (BID) | ORAL | 0 refills | Status: DC | PRN

## 2019-07-08 NOTE — Telephone Encounter (Signed)
Received call from patient.   Requested refill on Hydrocodone/APAP.   Ok to refill??  Last office visit 05/15/2019.  Last refill 06/07/2019.

## 2019-07-23 DIAGNOSIS — J449 Chronic obstructive pulmonary disease, unspecified: Secondary | ICD-10-CM | POA: Diagnosis not present

## 2019-08-07 ENCOUNTER — Other Ambulatory Visit: Payer: Self-pay | Admitting: Family Medicine

## 2019-08-07 MED ORDER — HYDROCODONE-ACETAMINOPHEN 7.5-325 MG PO TABS: 1.0000 | ORAL_TABLET | Freq: Two times a day (BID) | ORAL | 0 refills | Status: DC | PRN

## 2019-08-07 NOTE — Telephone Encounter (Signed)
Patient calling to get refill on hydrocodone  [facility] [place]

## 2019-08-07 NOTE — Telephone Encounter (Signed)
Patient is requesting a refill on Hydrocodone   LOV: 05/15/2019  LRF:   07/08/2019

## 2019-08-23 DIAGNOSIS — J449 Chronic obstructive pulmonary disease, unspecified: Secondary | ICD-10-CM | POA: Diagnosis not present

## 2019-08-28 ENCOUNTER — Telehealth (INDEPENDENT_AMBULATORY_CARE_PROVIDER_SITE_OTHER): Payer: Self-pay | Admitting: *Deleted

## 2019-08-28 ENCOUNTER — Other Ambulatory Visit (INDEPENDENT_AMBULATORY_CARE_PROVIDER_SITE_OTHER): Payer: Self-pay | Admitting: *Deleted

## 2019-08-28 ENCOUNTER — Encounter (INDEPENDENT_AMBULATORY_CARE_PROVIDER_SITE_OTHER): Payer: Self-pay | Admitting: *Deleted

## 2019-08-28 DIAGNOSIS — Z86010 Personal history of colonic polyps: Secondary | ICD-10-CM

## 2019-08-28 NOTE — Telephone Encounter (Signed)
Patient needs suprep TCS sch'd 3/12

## 2019-08-29 ENCOUNTER — Other Ambulatory Visit (HOSPITAL_COMMUNITY): Payer: Self-pay | Admitting: Family Medicine

## 2019-08-29 DIAGNOSIS — Z1231 Encounter for screening mammogram for malignant neoplasm of breast: Secondary | ICD-10-CM

## 2019-08-29 MED ORDER — SUPREP BOWEL PREP KIT 17.5-3.13-1.6 GM/177ML PO SOLN: 1.0000 | Freq: Once | ORAL | 0 refills | Status: AC

## 2019-09-05 ENCOUNTER — Telehealth (INDEPENDENT_AMBULATORY_CARE_PROVIDER_SITE_OTHER): Payer: Self-pay | Admitting: *Deleted

## 2019-09-05 ENCOUNTER — Other Ambulatory Visit: Payer: Self-pay

## 2019-09-05 ENCOUNTER — Ambulatory Visit (INDEPENDENT_AMBULATORY_CARE_PROVIDER_SITE_OTHER): Payer: Self-pay

## 2019-09-05 NOTE — Telephone Encounter (Signed)
Referring MD/PCP: [place]   Procedure: tcs with propofol  Reason/Indication:  Hx polyps  Has patient had this procedure before?  Yes, 2015  If so, when, by whom and where?    Is there a family history of colon cancer?  no  Who?  What age when diagnosed?    Is patient diabetic?   no      Does patient have prosthetic heart valve or mechanical valve?  no  Do you have a pacemaker/defibrillator?  no  Has patient ever had endocarditis/atrial fibrillation? no  Does patient use oxygen? no  Has patient had joint replacement within last 12 months?  no  Is patient constipated or do they take laxatives? no  Does patient have a history of alcohol/drug use?  no  Is patient on blood thinner such as Coumadin, Plavix and/or Aspirin? yes  Medications: asa 81 mg daily, acetaminophen 500 mg bid, albuterol 2 puffs daily, allopurinol 100 mg daily, atorvastatin 20 mg daily, benadryl 25 mg daily, fluticasone daily, furosemide, hydrocodone/acet 7.5/325 mg 1 tab bid, losartan 50 mg daily, magnesium 250 mg daily, omeprazole 40 mg daily, pyridostigmine 60 mg 1 tab tid, trazadone 50 mg 1/2 tab at bedtime   Allergies: nkda  Medication Adjustment per Dr Laural Golden: asa 2 days  Procedure date & time: 10/04/19

## 2019-09-06 ENCOUNTER — Other Ambulatory Visit: Payer: Self-pay

## 2019-09-06 MED ORDER — HYDROCODONE-ACETAMINOPHEN 7.5-325 MG PO TABS: 1.0000 | ORAL_TABLET | Freq: Two times a day (BID) | ORAL | 0 refills | Status: DC | PRN

## 2019-09-06 NOTE — Telephone Encounter (Signed)
Requested Prescriptions   Pending Prescriptions Disp Refills  . HYDROcodone-acetaminophen (NORCO) 7.5-325 MG tablet 60 tablet 0    Sig: Take 1 tablet by mouth 2 (two) times daily as needed for moderate pain.    Last OV 05/15/2019   Last written 08/07/2019

## 2019-09-11 ENCOUNTER — Ambulatory Visit: Payer: Self-pay | Admitting: Diagnostic Neuroimaging

## 2019-09-12 ENCOUNTER — Other Ambulatory Visit: Payer: Self-pay | Admitting: Family Medicine

## 2019-09-17 ENCOUNTER — Ambulatory Visit (INDEPENDENT_AMBULATORY_CARE_PROVIDER_SITE_OTHER): Payer: Medicare Other | Admitting: Diagnostic Neuroimaging

## 2019-09-17 ENCOUNTER — Other Ambulatory Visit: Payer: Self-pay

## 2019-09-17 ENCOUNTER — Encounter: Payer: Self-pay | Admitting: Diagnostic Neuroimaging

## 2019-09-17 VITALS — BP 152/78 | HR 84 | Temp 97.2°F | Ht 65.0 in | Wt 285.8 lb

## 2019-09-17 DIAGNOSIS — G7000 Myasthenia gravis without (acute) exacerbation: Secondary | ICD-10-CM

## 2019-09-17 MED ORDER — PYRIDOSTIGMINE BROMIDE 60 MG PO TABS: 60.0000 mg | ORAL_TABLET | Freq: Two times a day (BID) | ORAL | 12 refills | Status: DC

## 2019-09-17 MED ORDER — PYRIDOSTIGMINE BROMIDE ER 180 MG PO TBCR: 180.0000 mg | EXTENDED_RELEASE_TABLET | Freq: Every day | ORAL | 12 refills | Status: DC

## 2019-09-17 NOTE — Patient Instructions (Signed)
MYASTHENIA GRAVIS (ocular) - change pyridostigmine to 60mg  in AM and 60mg  at noon - start pyridostigmine 180mg  extended release at bedtime  - consider prednisone; ask Dr. Buelah Manis about BP and weight control and covid vaccine before starting prednisone  - caution if breathing, chewing, swallowing problems

## 2019-09-17 NOTE — Progress Notes (Signed)
[facility]  PATIENT: [name] DOB: [birth year]  REFERRING CLINICIAN: Mincey HISTORY FROM: patient  REASON FOR VISIT: follow up   HISTORICAL  CHIEF COMPLAINT:  Chief Complaint  Patient presents with  . Ocular myasthenia    rm 7, 8 month FU, "my left eye is getting worse"    HISTORY OF PRESENT ILLNESS:   UPDATE (09/17/19, VRP): Since last visit, doing worse with left eye ptosis and double vision. Symptoms are progressive. No breathing issues. Some gen weakness. Tolerating pyridostigmine 60mg  three times a day.    UPDATE (02/06/19, VRP): Since last visit, doing about the same. Symptoms are stable. Severity is mild. No alleviating or aggravating factors. Tolerating pyridostigmine. Mild intermittent left ptosis. No SOB, chewing or swallowing issues.   UPDATE (01/02/18, VRP): Since last visit, doing well. Tolerating meds. No alleviating or aggravating factors. No SOB, swallow diff, speech diff. Vision stable.   UPDATE (07/05/17, VRP): Since last visit, doing well. Tolerating pyridostigmine (60mg  three times a day). No alleviating or aggravating factors. Eyes are better. Has some flare of sxs every month (2x per month; few hrs each time). No breathing, swallowing or speaking problems. CT chest reviewed with patient.   PRIOR HPI (03/22/17): 69 year old female with hypertension, hypercholesterolemia, anxiety, here for evaluation of double vision. 6 weeks ago patient had sudden onset of redness in both eyes. She then developed double vision and left eyelid drooping. She went to eye doctor, then referred to emergency room for MRI testing to rule out stroke. MRI of the brain and orbits were negative for acute process. Patient then had myasthenia gravis antibody panel testing which was notable for borderline positive anti-striation autoantibodies. Patient for here for further evaluation and management. Patient also reports a number of other symptoms including fatigue,  swelling in legs, wheezing, headache, numbness, weakness, slurred speech. However these symptoms do not correlate with the recent 6 weeks of double vision and left eyelid weakness. Patient has been using an eye patch due to her double vision. Symptoms have been fairly consistent since onset. However later in her conversation patient states that symptoms fluctuate and are worse in the evening.   REVIEW OF SYSTEMS: Full 14 system review of systems performed and negative with exception of: as per HPI.    ALLERGIES: No Known Allergies  HOME MEDICATIONS: Outpatient Medications Prior to Visit  Medication Sig Dispense Refill  . acetaminophen (TYLENOL) 500 MG tablet Take 1,000 mg by mouth every 6 (six) hours as needed for mild pain.     Marland Kitchen albuterol (VENTOLIN HFA) 108 (90 Base) MCG/ACT inhaler Inhale 2 puffs into the lungs every 4 (four) hours as needed for wheezing or shortness of breath. 18 g 2  . allopurinol (ZYLOPRIM) 100 MG tablet Take 1 tablet (100 mg total) by mouth daily. 90 tablet 3  . aspirin EC 81 MG tablet Take 81 mg by mouth every evening.     Marland Kitchen atorvastatin (LIPITOR) 20 MG tablet Take 1 tablet (20 mg total) by mouth daily at 6 PM. 90 tablet 3  . diphenhydrAMINE (BENADRYL) 25 MG tablet Take 25 mg by mouth every morning.     . fluticasone (CUTIVATE) 0.05 % cream APPLY EXTERNALLY TO THE AFFECTED AREA DAILY 30 g 3  . furosemide (LASIX) 20 MG tablet TAKE 1 TABLET(20 MG) BY MOUTH DAILY FOR SWELLING 90 tablet 0  . HYDROcodone-acetaminophen (NORCO) 7.5-325 MG tablet Take 1 tablet by mouth 2 (two) times daily as needed for moderate pain. 60 tablet 0  .  losartan (COZAAR) 50 MG tablet TAKE 1 TABLET BY MOUTH DAILY 90 tablet 3  . Magnesium 250 MG TABS Take 1 tablet (250 mg total) by mouth daily. 30 tablet 11  . omeprazole (PRILOSEC) 40 MG capsule TAKE 1 CAPSULE(40 MG) BY MOUTH DAILY 90 capsule 3  . OXYGEN Inhale 1 L into the lungs at bedtime. IN ADDITION TO CPAP NIGHTLY    . pyridostigmine  (MESTINON) 60 MG tablet Take 1 tablet (60 mg total) by mouth 3 (three) times daily. 270 tablet 4  . traZODone (DESYREL) 50 MG tablet TAKE 1/2 TO 1 TABLET(25 TO 50 MG) BY MOUTH AT BEDTIME AS NEEDED FOR SLEEP 90 tablet 0   No facility-administered medications prior to visit.    PAST MEDICAL HISTORY: Past Medical History:  Diagnosis Date  . Achilles tendinitis   . Anxiety   . Asthma   . Depression   . GERD (gastroesophageal reflux disease)   . Gout   . H/O myasthenia gravis    left eye  . HTN (hypertension)   . Hyperlipidemia   . Low back pain   . Obesity hypoventilation syndrome ([place])   . Obstructive sleep apnea   . Osteoarthritis     PAST SURGICAL HISTORY: Past Surgical History:  Procedure Laterality Date  . CARDIAC CATHETERIZATION  2006  . CATARACT EXTRACTION W/PHACO Left 09/17/2015   Procedure: CATARACT EXTRACTION PHACO AND INTRAOCULAR LENS PLACEMENT LEFT EYE cde=8.58;  Surgeon: Tonny Branch, MD;  Location: [facility];  Service: Ophthalmology;  Laterality: Left;  . CATARACT EXTRACTION W/PHACO Right 05/15/2017   Procedure: CATARACT EXTRACTION PHACO AND INTRAOCULAR LENS PLACEMENT (IOC);  Surgeon: Tonny Branch, MD;  Location: [facility];  Service: Ophthalmology;  Laterality: Right;  CDE: 6.34  . COLONOSCOPY N/A 12/25/2013   Procedure: COLONOSCOPY;  Surgeon: Rogene Houston, MD;  Location: [facility];  Service: Endoscopy;  Laterality: N/A;  730-rescheduled Ann notified pt  . CYST EXCISION N/A 09/12/2016   Procedure: EXCISION SEBACEOUS CYST, BACK;  Surgeon: Aviva Signs, MD;  Location: [facility];  Service: General;  Laterality: N/A;  . INTRAOCULAR LENS INSERTION Bilateral 2018   Dr. Tonny Branch   . TUBAL LIGATION      FAMILY HISTORY: Family History  Problem Relation Age of Onset  . Heart disease Mother   . Thyroid disease Mother   . Heart failure Father   . Lung disease Father   . Diabetes Brother   . Crohn's disease Daughter     SOCIAL HISTORY:  Social History   Socioeconomic  History  . Marital status: Widowed    Spouse name: Not on file  . Number of children: 3  . Years of education: 34  . Highest education level: Not on file  Occupational History  . Occupation: disabled    Fish farm manager: UNEMPLOYED  Tobacco Use  . Smoking status: Former Smoker    Packs/day: 2.00    Years: 36.00    Pack years: 72.00    Types: Cigarettes    Start date: 06/25/1969    Quit date: 03/25/2005    Years since quitting: 14.4  . Smokeless tobacco: Never Used  Substance and Sexual Activity  . Alcohol use: No    Alcohol/week: 0.0 standard drinks  . Drug use: No  . Sexual activity: Never    Birth control/protection: Post-menopausal  Other Topics Concern  . Not on file  Social History Narrative   Originally from [place]. She has always lived in [place]. 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 No pets currently. No bird, mold, or hot tub exposure.    Lives alone   No caffeine   Social Determinants of Health   Financial Resource Strain:   . Difficulty of Paying Living Expenses: Not on file  Food Insecurity:   . Worried About Charity fundraiser in the Last Year: Not on file  . Ran Out of Food in the Last Year: Not on file  Transportation Needs:   . Lack of Transportation (Medical): Not on file  . Lack of Transportation (Non-Medical): Not on file  Physical Activity:   . Days of Exercise per Week: Not on file  . Minutes of Exercise per Session: Not on file  Stress:   . Feeling of Stress : Not on file  Social Connections:   . Frequency of Communication with Friends and Family: Not on file  . Frequency of Social Gatherings with Friends and Family: Not on file  . Attends Religious Services: Not on file  . Active Member of Clubs or Organizations: Not on file  . Attends Archivist Meetings: Not on file  . Marital Status: Not on file  Intimate Partner Violence:   . Fear of Current or Ex-Partner: Not on file  . Emotionally Abused: Not on file    . Physically Abused: Not on file  . Sexually Abused: Not on file     PHYSICAL EXAM  GENERAL EXAM/CONSTITUTIONAL: Vitals:  Vitals:   09/17/19 0937  BP: (!) 152/78  Pulse: 84  Temp: (!) 97.2 F (36.2 C)  Weight: 285 lb 12.8 oz (129.6 kg)  Height: 5\' 5"  (1.651 m)   Body mass index is 47.56 kg/m. No exam data present  Patient is in no distress; well developed, nourished and groomed; neck is supple  CARDIOVASCULAR:  Examination of carotid arteries is normal; no carotid bruits  Regular rate and rhythm, no murmurs  Examination of peripheral vascular system by observation and palpation is normal  EYES:  Ophthalmoscopic exam of optic discs and posterior segments is normal; no papilledema or hemorrhages  MUSCULOSKELETAL:  Gait, strength, tone, movements noted in Neurologic exam below  NEUROLOGIC: MENTAL STATUS:  No flowsheet data found.  awake, alert, oriented to person, place and time  recent and remote memory intact  normal attention and concentration  language fluent, comprehension intact, naming intact,   fund of knowledge appropriate  CRANIAL NERVE:   2nd - no papilledema on fundoscopic exam  2nd, 3rd, 4th, 6th - pupils equal and reactive to light, visual fields full to confrontation, extraocular muscles intact, LEFT PTOSIS; MILD DOUBLE VISION ON RIGHT GAZE  5th - facial sensation symmetric  7th - facial strength symmetric  8th - hearing intact  9th - palate elevates symmetrically, uvula midline  11th - shoulder shrug symmetric  12th - tongue protrusion midline  MOTOR:   normal bulk and tone, full strength in the BUE, BLE; EXCEPT BILATERAL DELTOID 4 AND LEFT HIP FLEXION 4  SENSORY:   normal and symmetric to light touch  COORDINATION:   finger-nose-finger, fine finger movements normal  REFLEXES:   deep tendon reflexes TRACE and symmetric  GAIT/STATION:   narrow based gait    DIAGNOSTIC DATA (LABS, IMAGING, TESTING) - I  reviewed patient records, labs, notes, testing and imaging myself where available.  Lab Results  Component Value Date   WBC 6.0 01/02/2019   HGB 12.4 01/02/2019   HCT 36.9 01/02/2019   MCV 90.7 01/02/2019   PLT 223 01/02/2019  Component Value Date/Time   NA 143 05/15/2019 1044   K 4.9 05/15/2019 1044   CL 104 05/15/2019 1044   CO2 30 05/15/2019 1044   GLUCOSE 141 (H) 05/15/2019 1044   BUN 11 05/15/2019 1044   CREATININE 1.01 (H) 05/15/2019 1044   CALCIUM 9.3 05/15/2019 1044   PROT 7.3 05/15/2019 1044   ALBUMIN 3.5 02/24/2017 0938   AST 12 05/15/2019 1044   ALT 11 05/15/2019 1044   ALKPHOS 71 02/24/2017 0938   BILITOT 0.6 05/15/2019 1044   GFRNONAA 46 (L) 02/24/2017 0938   GFRAA 54 (L) 02/24/2017 0938   Lab Results  Component Value Date   CHOL 164 05/15/2019   HDL 58 05/15/2019   LDLCALC 86 05/15/2019   LDLDIRECT 157 (H) 02/25/2013   TRIG 104 05/15/2019   CHOLHDL 2.8 05/15/2019   Lab Results  Component Value Date   HGBA1C 5.6 12/06/2017   Lab Results  Component Value Date   VITAMINB12 450 01/25/2012   Lab Results  Component Value Date   TSH 1.766 01/25/2012    02/24/17 MRI brain / orbits [I reviewed images myself and agree with interpretation. -VRP]  - No explanation for symptoms. No infarct, compressive lesion, or neuritis findings.  03/01/17 labs - anti AchR binding, blocking, modulation - normal - anti striational ab - 1:40 (h)  04/05/17 CT chest - No evidence of residual thymic tissue or or thymoma. - No acute abnormality noted.    ASSESSMENT AND PLAN  69 y.o. year old female here with new onset of double vision left eyelid ptosis, with positive ice pack test in office, and slightly positive anti-striation all antibody testing. Findings consistent with ocular myasthenia gravis.   Dx:  1. Ocular myasthenia (HCC)     PLAN:  MYASTHENIA GRAVIS (ocular) - change pyridostigmine to 60mg  in AM and 60mg  at noon  - start pyridostigmine 180mg   extended release at bedtime  - consider additional immunosuppressant therapy (prednisone); patient will ask PCP about BP and weight control and covid vaccine before starting prednisone  - cautioned patient if she develops breathing, chewing, swallowing sxs; patient to contact us right away or go to ER if bulbar or generalized symptoms develop  Meds ordered this encounter  Medications  . pyridostigmine (MESTINON) 180 MG CR tablet    Sig: Take 1 tablet (180 mg total) by mouth daily.    Dispense:  30 tablet    Refill:  12  . pyridostigmine (MESTINON) 60 MG tablet    Sig: Take 1 tablet (60 mg total) by mouth 2 (two) times daily.    Dispense:  60 tablet    Refill:  12   Return in about 4 months (around 01/15/2020). or sooner if needed    Penni Bombard, MD 1/61/0960, 4:54 AM Certified in Neurology, Neurophysiology and [facility] [address] [place], [place] [address] [phone]

## 2019-09-18 ENCOUNTER — Encounter: Payer: Self-pay | Admitting: Family Medicine

## 2019-09-18 ENCOUNTER — Ambulatory Visit (INDEPENDENT_AMBULATORY_CARE_PROVIDER_SITE_OTHER): Payer: Medicare Other | Admitting: Family Medicine

## 2019-09-18 ENCOUNTER — Other Ambulatory Visit: Payer: Self-pay

## 2019-09-18 VITALS — BP 144/80 | HR 96 | Temp 98.7°F | Resp 14 | Ht 65.0 in | Wt 286.0 lb

## 2019-09-18 DIAGNOSIS — F329 Major depressive disorder, single episode, unspecified: Secondary | ICD-10-CM

## 2019-09-18 DIAGNOSIS — M159 Polyosteoarthritis, unspecified: Secondary | ICD-10-CM

## 2019-09-18 DIAGNOSIS — G7000 Myasthenia gravis without (acute) exacerbation: Secondary | ICD-10-CM

## 2019-09-18 DIAGNOSIS — R609 Edema, unspecified: Secondary | ICD-10-CM | POA: Diagnosis not present

## 2019-09-18 DIAGNOSIS — I10 Essential (primary) hypertension: Secondary | ICD-10-CM

## 2019-09-18 DIAGNOSIS — G4733 Obstructive sleep apnea (adult) (pediatric): Secondary | ICD-10-CM | POA: Diagnosis not present

## 2019-09-18 DIAGNOSIS — E785 Hyperlipidemia, unspecified: Secondary | ICD-10-CM | POA: Diagnosis not present

## 2019-09-18 DIAGNOSIS — E7439 Other disorders of intestinal carbohydrate absorption: Secondary | ICD-10-CM

## 2019-09-18 DIAGNOSIS — E669 Obesity, unspecified: Secondary | ICD-10-CM

## 2019-09-18 DIAGNOSIS — R7309 Other abnormal glucose: Secondary | ICD-10-CM | POA: Diagnosis not present

## 2019-09-18 DIAGNOSIS — M8949 Other hypertrophic osteoarthropathy, multiple sites: Secondary | ICD-10-CM

## 2019-09-18 NOTE — Patient Instructions (Addendum)
Wear compression hose every day  Continue your current medications Check your blood pressure at home  F/U 4 months

## 2019-09-18 NOTE — Assessment & Plan Note (Signed)
Continue CPAP therapy per pulmonary.

## 2019-09-18 NOTE — Assessment & Plan Note (Signed)
Compression hose and lasix

## 2019-09-18 NOTE — Assessment & Plan Note (Signed)
Continue chronic pain medication.

## 2019-09-18 NOTE — Assessment & Plan Note (Signed)
Maintained on trazodone, which also helps sleep

## 2019-09-18 NOTE — Assessment & Plan Note (Signed)
Recheck lipids.  encourage healthier eating more vegetables/ fruits , less fried foods

## 2019-09-18 NOTE — Progress Notes (Signed)
Subjective:    Patient ID: [name], female    DOB: 1951-05-27, 69 y.o.   MRN: [id number]  Patient presents for Follow-up (is fasting)  Seen by neurology told her myasthenia is worsening, now has weakness in legs.  She has been getting double vision in left eye and more weakness.  Her pyridostimgime was adjusted now on 60mg  BID and  180mg  XR at bedtime.  She is planning for COVID-19 vaccine next month, then she will start prednisone for 6 months  For immunosuppressant for her myasthenia  HTN- her bp was elevated yesterday at her visit, but she was stressed due having to ride transportation and the [facility] was backed up, This AM has not taken her meds, has bp cuff at home but does not check  Hyperlipidemia-  On lipitor   OSA on CPAP , uses every night  Scheduled for colonoscopy in March   Peripherla edema- still gets some swelling , she has compression hose but she has not been wearing them, states swelling worse in the summer and warm months  taking lasix    Review Of Systems:  GEN- denies fatigue, fever, weight loss,weakness, recent illness HEENT- denies eye drainage, change in vision, nasal discharge, CVS- denies chest pain, palpitations RESP- denies SOB, cough, wheeze ABD- denies N/V, change in stools, abd pain GU- denies dysuria, hematuria, dribbling, incontinence MSK- + joint pain, muscle aches, injury Neuro- denies headache, dizziness, syncope, seizure activity       Objective:    BP (!) 144/80   Pulse 96   Temp 98.7 F (37.1 C) (Temporal)   Resp 14   Ht 5\' 5"  (1.651 m)   Wt 286 lb (129.7 kg)   SpO2 99%   BMI 47.59 kg/m  GEN- NAD, alert and oriented x3,examined  Sitting in chair, unable to get onto bed  HEENT- PERRL, EOMI, non injected sclera, pink conjunctiva, MMM, oropharynx clear Neck- Supple, no JVD  CVS- RRR, no murmur RESP-CTAB ABD-NABS,soft,NT,ND EXT- pedal edema Pulses- Radial 2+DP palpated         Assessment & Plan:      Problem  List Items Addressed This Visit      Unprioritized   Class 3 obesity   Depression    Maintained on trazodone, which also helps sleep      Essential hypertension - Primary    Pressure is mildly elevated today.  She has not had her medications.  Advised her to check her blood pressure at home so we can see what her readings are.  This will be very important when she does go on steroids as well as this will likely cause her to have elevated blood pressures.  We will also check her A1c today.  She has had some insulin resistance in the past in the setting of her class III obesity.  Want to see what her baseline is again this year before she starts chronic prednisone therapy.      Relevant Orders   Comprehensive metabolic panel   CBC with Differential/Platelet   Hyperlipidemia    Recheck lipids.  encourage healthier eating more vegetables/ fruits , less fried foods      Relevant Orders   Lipid panel   OBSTRUCTIVE SLEEP APNEA    Continue CPAP therapy per pulmonary.      Ocular myasthenia gravis ([place])   Osteoarthritis    Continue chronic pain medication.      Peripheral edema    Compression hose and lasix  Other Visit Diagnoses    Glucose intolerance       Relevant Orders   Hemoglobin A1c      Note: This dictation was prepared with Dragon dictation along with smaller phrase technology. Any transcriptional errors that result from this process are unintentional.

## 2019-09-18 NOTE — Assessment & Plan Note (Signed)
Pressure is mildly elevated today.  She has not had her medications.  Advised her to check her blood pressure at home so we can see what her readings are.  This will be very important when she does go on steroids as well as this will likely cause her to have elevated blood pressures.  We will also check her A1c today.  She has had some insulin resistance in the past in the setting of her class III obesity.  Want to see what her baseline is again this year before she starts chronic prednisone therapy.

## 2019-09-19 LAB — COMPREHENSIVE METABOLIC PANEL
AG Ratio: 1.2 (calc) (ref 1.0–2.5)
ALT: 8 U/L (ref 6–29)
AST: 11 U/L (ref 10–35)
Albumin: 3.8 g/dL (ref 3.6–5.1)
Alkaline phosphatase (APISO): 76 U/L (ref 37–153)
BUN/Creatinine Ratio: 12 (calc) (ref 6–22)
BUN: 14 mg/dL (ref 7–25)
CO2: 30 mmol/L (ref 20–32)
Calcium: 9.2 mg/dL (ref 8.6–10.4)
Chloride: 104 mmol/L (ref 98–110)
Creat: 1.14 mg/dL — ABNORMAL HIGH (ref 0.60–0.93)
Globulin: 3.1 g/dL (calc) (ref 1.9–3.7)
Glucose, Bld: 165 mg/dL — ABNORMAL HIGH (ref 65–99)
Potassium: 4.4 mmol/L (ref 3.5–5.3)
Sodium: 141 mmol/L (ref 135–146)
Total Bilirubin: 0.4 mg/dL (ref 0.2–1.2)
Total Protein: 6.9 g/dL (ref 6.1–8.1)

## 2019-09-19 LAB — CBC WITH DIFFERENTIAL/PLATELET
Absolute Monocytes: 532 cells/uL (ref 200–950)
Basophils Absolute: 62 cells/uL (ref 0–200)
Basophils Relative: 1.1 %
Eosinophils Absolute: 353 cells/uL (ref 15–500)
Eosinophils Relative: 6.3 %
HCT: 39.8 % (ref 35.0–45.0)
Hemoglobin: 13 g/dL (ref 11.7–15.5)
Lymphs Abs: 1898 cells/uL (ref 850–3900)
MCH: 30 pg (ref 27.0–33.0)
MCHC: 32.7 g/dL (ref 32.0–36.0)
MCV: 91.7 fL (ref 80.0–100.0)
MPV: 9.8 fL (ref 7.5–12.5)
Monocytes Relative: 9.5 %
Neutro Abs: 2755 cells/uL (ref 1500–7800)
Neutrophils Relative %: 49.2 %
Platelets: 217 10*3/uL (ref 140–400)
RBC: 4.34 10*6/uL (ref 3.80–5.10)
RDW: 13 % (ref 11.0–15.0)
Total Lymphocyte: 33.9 %
WBC: 5.6 10*3/uL (ref 3.8–10.8)

## 2019-09-19 LAB — LIPID PANEL
Cholesterol: 287 mg/dL — ABNORMAL HIGH (ref <200)
HDL: 57 mg/dL (ref >=50)
LDL Cholesterol (Calc): 184 mg/dL (calc) — ABNORMAL HIGH
Non-HDL Cholesterol (Calc): 230 mg/dL (calc) — ABNORMAL HIGH (ref <130)
Total CHOL/HDL Ratio: 5 (calc) — ABNORMAL HIGH (ref <5.0)
Triglycerides: 256 mg/dL — ABNORMAL HIGH (ref <150)

## 2019-09-19 LAB — HEMOGLOBIN A1C
Hgb A1c MFr Bld: 5.7 % of total Hgb — ABNORMAL HIGH (ref <5.7)
Mean Plasma Glucose: 117 (calc)
eAG (mmol/L): 6.5 (calc)

## 2019-09-20 ENCOUNTER — Other Ambulatory Visit: Payer: Self-pay | Admitting: *Deleted

## 2019-09-20 MED ORDER — ATORVASTATIN CALCIUM 40 MG PO TABS: 40.0000 mg | ORAL_TABLET | Freq: Every day | ORAL | 1 refills | Status: DC

## 2019-09-21 NOTE — Telephone Encounter (Signed)
Colonoscopy with propofol. 

## 2019-09-22 DIAGNOSIS — J449 Chronic obstructive pulmonary disease, unspecified: Secondary | ICD-10-CM | POA: Diagnosis not present

## 2019-10-02 ENCOUNTER — Encounter (HOSPITAL_COMMUNITY)
Admission: RE | Admit: 2019-10-02 | Discharge: 2019-10-02 | Disposition: A | Discharge status: Home / Self Care | Payer: Medicare Other | Source: Ambulatory Visit | Attending: Internal Medicine | Admitting: Internal Medicine

## 2019-10-02 ENCOUNTER — Other Ambulatory Visit: Payer: Self-pay

## 2019-10-02 ENCOUNTER — Other Ambulatory Visit (HOSPITAL_COMMUNITY)
Admission: RE | Admit: 2019-10-02 | Discharge: 2019-10-02 | Disposition: A | Discharge status: Home / Self Care | Payer: Medicare Other | Source: Ambulatory Visit | Attending: Internal Medicine | Admitting: Internal Medicine

## 2019-10-02 ENCOUNTER — Encounter (HOSPITAL_COMMUNITY): Payer: Self-pay

## 2019-10-02 DIAGNOSIS — Z20822 Contact with and (suspected) exposure to covid-19: Secondary | ICD-10-CM | POA: Diagnosis not present

## 2019-10-02 DIAGNOSIS — Z01812 Encounter for preprocedural laboratory examination: Secondary | ICD-10-CM | POA: Insufficient documentation

## 2019-10-02 LAB — SARS CORONAVIRUS 2 (TAT 6-24 HRS): SARS Coronavirus 2: NEGATIVE

## 2019-10-02 NOTE — Pre-Procedure Instructions (Signed)
Called patient to do preop phone call. I tried to go over information with her, but she was very argumentative with everything that I tried to explain to her starting with her arrival time. She states "I will NOT take any of my medications before I come, I have been doing this for years and I know what Im doing." she refused to answer any more questions. I tried to explain rational behind all I was saying, she states, "Im not trying to argue with you but I told you what Im going to do". She did read back the time of arrival to me and stated she knew how to do her bowel prep.

## 2019-10-04 ENCOUNTER — Other Ambulatory Visit: Payer: Self-pay | Admitting: Family Medicine

## 2019-10-04 ENCOUNTER — Ambulatory Visit (HOSPITAL_COMMUNITY): Payer: Medicare Other | Admitting: Anesthesiology

## 2019-10-04 ENCOUNTER — Ambulatory Visit (HOSPITAL_COMMUNITY)
Admission: RE | Admit: 2019-10-04 | Discharge: 2019-10-04 | Disposition: A | Discharge status: Home / Self Care | Payer: Medicare Other | Attending: Internal Medicine | Admitting: Internal Medicine

## 2019-10-04 ENCOUNTER — Encounter (HOSPITAL_COMMUNITY)
Admission: RE | Disposition: A | Discharge status: Home / Self Care | Payer: Self-pay | Source: Home / Self Care | Attending: Internal Medicine

## 2019-10-04 ENCOUNTER — Encounter (HOSPITAL_COMMUNITY): Payer: Self-pay | Admitting: Internal Medicine

## 2019-10-04 DIAGNOSIS — Z09 Encounter for follow-up examination after completed treatment for conditions other than malignant neoplasm: Secondary | ICD-10-CM | POA: Diagnosis not present

## 2019-10-04 DIAGNOSIS — I10 Essential (primary) hypertension: Secondary | ICD-10-CM | POA: Insufficient documentation

## 2019-10-04 DIAGNOSIS — K644 Residual hemorrhoidal skin tags: Secondary | ICD-10-CM | POA: Diagnosis not present

## 2019-10-04 DIAGNOSIS — E785 Hyperlipidemia, unspecified: Secondary | ICD-10-CM | POA: Diagnosis not present

## 2019-10-04 DIAGNOSIS — Z9981 Dependence on supplemental oxygen: Secondary | ICD-10-CM | POA: Insufficient documentation

## 2019-10-04 DIAGNOSIS — Z86010 Personal history of colonic polyps: Secondary | ICD-10-CM | POA: Insufficient documentation

## 2019-10-04 DIAGNOSIS — K5730 Diverticulosis of large intestine without perforation or abscess without bleeding: Secondary | ICD-10-CM | POA: Insufficient documentation

## 2019-10-04 DIAGNOSIS — Z87891 Personal history of nicotine dependence: Secondary | ICD-10-CM | POA: Insufficient documentation

## 2019-10-04 DIAGNOSIS — J45909 Unspecified asthma, uncomplicated: Secondary | ICD-10-CM | POA: Insufficient documentation

## 2019-10-04 DIAGNOSIS — G7000 Myasthenia gravis without (acute) exacerbation: Secondary | ICD-10-CM | POA: Diagnosis not present

## 2019-10-04 DIAGNOSIS — M549 Dorsalgia, unspecified: Secondary | ICD-10-CM | POA: Diagnosis not present

## 2019-10-04 DIAGNOSIS — K219 Gastro-esophageal reflux disease without esophagitis: Secondary | ICD-10-CM | POA: Insufficient documentation

## 2019-10-04 DIAGNOSIS — Z6841 Body Mass Index (BMI) 40.0 and over, adult: Secondary | ICD-10-CM | POA: Insufficient documentation

## 2019-10-04 DIAGNOSIS — Z7982 Long term (current) use of aspirin: Secondary | ICD-10-CM | POA: Insufficient documentation

## 2019-10-04 DIAGNOSIS — Z1211 Encounter for screening for malignant neoplasm of colon: Secondary | ICD-10-CM | POA: Diagnosis not present

## 2019-10-04 DIAGNOSIS — M1990 Unspecified osteoarthritis, unspecified site: Secondary | ICD-10-CM | POA: Insufficient documentation

## 2019-10-04 DIAGNOSIS — K621 Rectal polyp: Secondary | ICD-10-CM | POA: Diagnosis not present

## 2019-10-04 DIAGNOSIS — E662 Morbid (severe) obesity with alveolar hypoventilation: Secondary | ICD-10-CM | POA: Insufficient documentation

## 2019-10-04 DIAGNOSIS — Z79899 Other long term (current) drug therapy: Secondary | ICD-10-CM | POA: Diagnosis not present

## 2019-10-04 DIAGNOSIS — M109 Gout, unspecified: Secondary | ICD-10-CM | POA: Diagnosis not present

## 2019-10-04 HISTORY — PX: POLYPECTOMY: SHX5525

## 2019-10-04 HISTORY — PX: COLONOSCOPY WITH PROPOFOL: SHX5780

## 2019-10-04 LAB — GLUCOSE, CAPILLARY: Glucose-Capillary: 97 mg/dL (ref 70–99)

## 2019-10-04 SURGERY — COLONOSCOPY WITH PROPOFOL
Anesthesia: General

## 2019-10-04 MED ORDER — KETAMINE HCL 10 MG/ML IJ SOLN
INTRAMUSCULAR | Status: DC | PRN
  Administered 2019-10-04: 20 mg via INTRAVENOUS

## 2019-10-04 MED ORDER — LACTATED RINGERS IV SOLN: INTRAVENOUS | Status: DC

## 2019-10-04 MED ORDER — KETAMINE HCL 50 MG/5ML IJ SOSY
PREFILLED_SYRINGE | INTRAMUSCULAR | Status: AC
  Filled 2019-10-04: qty 5

## 2019-10-04 MED ORDER — PROPOFOL 500 MG/50ML IV EMUL
INTRAVENOUS | Status: DC | PRN
  Administered 2019-10-04: 175 ug/kg/min via INTRAVENOUS

## 2019-10-04 MED ORDER — CHLORHEXIDINE GLUCONATE CLOTH 2 % EX PADS: 6.0000 | MEDICATED_PAD | Freq: Once | CUTANEOUS | Status: DC

## 2019-10-04 MED ORDER — PROPOFOL 10 MG/ML IV BOLUS
INTRAVENOUS | Status: DC | PRN
  Administered 2019-10-04: 20 mg via INTRAVENOUS

## 2019-10-04 MED ORDER — LACTATED RINGERS IV SOLN: INTRAVENOUS | Status: DC | PRN

## 2019-10-04 NOTE — Telephone Encounter (Signed)
Patient needs refill on hydrocodone  °[facility] [place]   °

## 2019-10-04 NOTE — Anesthesia Postprocedure Evaluation (Signed)
Anesthesia Post Note  Patient: Kathrynn Speed  Procedure(s) Performed: COLONOSCOPY WITH PROPOFOL (N/A ) POLYPECTOMY  Patient location during evaluation: PACU Anesthesia Type: General Level of consciousness: awake and alert and patient cooperative Pain management: satisfactory to patient Vital Signs Assessment: post-procedure vital signs reviewed and stable Respiratory status: spontaneous breathing and patient connected to nasal cannula oxygen Cardiovascular status: stable Postop Assessment: no apparent nausea or vomiting Anesthetic complications: no     Last Vitals:  Vitals:   10/04/19 0805 10/04/19 0815  BP: (!) 123/49 (!) 112/53  Pulse: 74 66  Resp: 19 16  Temp: 36.9 C   SpO2: 100% 100%    Last Pain:  Vitals:   10/04/19 0805  TempSrc:   PainSc: 0-No pain                 Sheryl Towell

## 2019-10-04 NOTE — Transfer of Care (Signed)
Immediate Anesthesia Transfer of Care Note  Patient: [name]  Procedure(s) Performed: COLONOSCOPY WITH PROPOFOL (N/A ) POLYPECTOMY  Patient Location: PACU  Anesthesia Type:General  Level of Consciousness: awake, alert  and patient cooperative  Airway & Oxygen Therapy: Patient Spontanous Breathing and Patient connected to nasal cannula oxygen  Post-op Assessment: Report given to RN and Post -op Vital signs reviewed and stable  Post vital signs: Reviewed and stable  Last Vitals:  Vitals Value Taken Time  BP 123/49 10/04/19 0804  Temp 98.4   Pulse 70 10/04/19 0805  Resp 19 10/04/19 0805  SpO2 100 % 10/04/19 0805  Vitals shown include unvalidated device data.  Last Pain:  Vitals:   10/04/19 0728  TempSrc: Oral  PainSc: 0-No pain      Patients Stated Pain Goal: 5 (47/42/59 [id number])  Complications: No apparent anesthesia complications

## 2019-10-04 NOTE — H&P (Signed)
[name] is an 69 y.o. female.   Chief Complaint: Patient is here for colonoscopy. HPI: Patient is 69 year old Afro-American female with history of colonic adenomas who is here for surveillance colonoscopy.  Last exam was in June 2015 with removal of 3 polyps/adenomas.  She has no GI complaints.  She denies abdominal pain change in bowel habits or rectal bleeding.  She has good appetite.  She denies involuntary weight loss.  She is on pain medication for back pain.  She says she is not able to do a lot of physical activity because of myasthenia gravis.  She denies chest pain or shortness of breath.  Last aspirin dose was 3 days ago.  Past Medical History:  Diagnosis Date  . Achilles tendinitis   . Anxiety   . Asthma   . Depression   . GERD (gastroesophageal reflux disease)   . Gout   . H/O myasthenia gravis    left eye  . HTN (hypertension)   . Hyperlipidemia   . Low back pain   . Obesity hypoventilation syndrome ([place])   . Obstructive sleep apnea   . Osteoarthritis     Past Surgical History:  Procedure Laterality Date  . CARDIAC CATHETERIZATION  2006  . CATARACT EXTRACTION W/PHACO Left 09/17/2015   Procedure: CATARACT EXTRACTION PHACO AND INTRAOCULAR LENS PLACEMENT LEFT EYE cde=8.58;  Surgeon: Tonny Branch, MD;  Location: [facility];  Service: Ophthalmology;  Laterality: Left;  . CATARACT EXTRACTION W/PHACO Right 05/15/2017   Procedure: CATARACT EXTRACTION PHACO AND INTRAOCULAR LENS PLACEMENT (IOC);  Surgeon: Tonny Branch, MD;  Location: [facility];  Service: Ophthalmology;  Laterality: Right;  CDE: 6.34  . COLONOSCOPY N/A 12/25/2013   Procedure: COLONOSCOPY;  Surgeon: Rogene Houston, MD;  Location: [facility];  Service: Endoscopy;  Laterality: N/A;  730-rescheduled Ann notified pt  . CYST EXCISION N/A 09/12/2016   Procedure: EXCISION SEBACEOUS CYST, BACK;  Surgeon: Aviva Signs, MD;  Location: [facility];  Service: General;  Laterality: N/A;  . INTRAOCULAR LENS INSERTION Bilateral 2018    Dr. Tonny Branch   . TUBAL LIGATION      Family History  Problem Relation Age of Onset  . Heart disease Mother   . Thyroid disease Mother   . Heart failure Father   . Lung disease Father   . Diabetes Brother   . Crohn's disease Daughter    Social History:  reports that she quit smoking about 14 years ago. Her smoking use included cigarettes. She started smoking about 50 years ago. She has a 72.00 pack-year smoking history. She has never used smokeless tobacco. She reports that she does not drink alcohol or use drugs.  Allergies: No Known Allergies  Medications Prior to Admission  Medication Sig Dispense Refill  . acetaminophen (TYLENOL) 500 MG tablet Take 1,000 mg by mouth every 6 (six) hours as needed for mild pain.     Marland Kitchen albuterol (VENTOLIN HFA) 108 (90 Base) MCG/ACT inhaler Inhale 2 puffs into the lungs every 4 (four) hours as needed for wheezing or shortness of breath. 18 g 2  . allopurinol (ZYLOPRIM) 100 MG tablet Take 1 tablet (100 mg total) by mouth daily. 90 tablet 3  . aspirin EC 81 MG tablet Take 81 mg by mouth every evening.     Marland Kitchen atorvastatin (LIPITOR) 40 MG tablet Take 1 tablet (40 mg total) by mouth daily at 6 PM. (Patient taking differently: Take 40 mg by mouth daily. ) 90 tablet 1  . diphenhydrAMINE (BENADRYL)  25 MG tablet Take 25 mg by mouth daily.     . fluticasone (CUTIVATE) 0.05 % cream APPLY EXTERNALLY TO THE AFFECTED AREA DAILY (Patient taking differently: Apply 1 application topically daily as needed (rash). ) 30 g 3  . furosemide (LASIX) 20 MG tablet TAKE 1 TABLET(20 MG) BY MOUTH DAILY FOR SWELLING (Patient taking differently: Take 20 mg by mouth daily. TAKE 1 TABLET(20 MG) BY MOUTH DAILY FOR SWELLIN) 90 tablet 0  . HYDROcodone-acetaminophen (NORCO) 7.5-325 MG tablet Take 1 tablet by mouth 2 (two) times daily as needed for moderate pain. 60 tablet 0  . losartan (COZAAR) 50 MG tablet TAKE 1 TABLET BY MOUTH DAILY (Patient taking differently: Take 50 mg by mouth daily.  ) 90 tablet 3  . Magnesium 250 MG TABS Take 1 tablet (250 mg total) by mouth daily. (Patient taking differently: Take 250 mg by mouth daily in the afternoon. Midday) 30 tablet 11  . omeprazole (PRILOSEC) 40 MG capsule TAKE 1 CAPSULE(40 MG) BY MOUTH DAILY (Patient taking differently: Take 40 mg by mouth every evening. ) 90 capsule 3  . OXYGEN Inhale 1 L into the lungs at bedtime. IN ADDITION TO CPAP NIGHTLY    . pyridostigmine (MESTINON) 180 MG CR tablet Take 1 tablet (180 mg total) by mouth daily. (Patient taking differently: Take 180 mg by mouth at bedtime. ) 30 tablet 12  . pyridostigmine (MESTINON) 60 MG tablet Take 1 tablet (60 mg total) by mouth 2 (two) times daily. (Patient taking differently: Take 60 mg by mouth 2 (two) times daily. Morning & midday) 60 tablet 12  . traZODone (DESYREL) 50 MG tablet TAKE 1/2 TO 1 TABLET(25 TO 50 MG) BY MOUTH AT BEDTIME AS NEEDED FOR SLEEP (Patient taking differently: Take 25-50 mg by mouth at bedtime. TAKE 1/2 TO 1 TABLET(25 TO 50 MG) BY MOUTH AT BEDTIME AS NEEDED FOR SLEEP) 90 tablet 0    No results found for this or any previous visit (from the past 48 hour(s)). No results found.  Review of Systems  There were no vitals taken for this visit. Physical Exam  Constitutional:  Well-developed obese female in NAD.  HENT:  Mouth/Throat: Oropharynx is clear and moist.  Eyes: Conjunctivae are normal. No scleral icterus.  Neck:  Short neck without adenopathy or masses.  Cardiovascular: Normal rate, regular rhythm and normal heart sounds.  No murmur heard. Respiratory: Effort normal and breath sounds normal.  GI:  Is full but soft and nontender with organomegaly or masses.  Musculoskeletal:        General: Edema present.     Comments: Nonpitting pretibial edema.  Neurological: She is alert.  Skin: Skin is warm and dry.     Assessment/Plan  History of colonic adenomas Surveillance colonoscopy.  Hildred Laser, MD 10/04/2019, 7:26 AM

## 2019-10-04 NOTE — Anesthesia Procedure Notes (Addendum)
Date/Time: 10/04/2019 7:39 AM Performed by: Vista Deck, CRNA Pre-anesthesia Checklist: Patient identified, Emergency Drugs available, Suction available, Timeout performed and Patient being monitored Patient Re-evaluated:Patient Re-evaluated prior to induction Oxygen Delivery Method: Non-rebreather mask

## 2019-10-04 NOTE — Op Note (Signed)
[facility] Patient Name: [name] Procedure Date: 10/04/2019 7:16 AM MRN: [id number] Date of Birth: January 01, 1951 Attending MD: Hildred Laser , MD CSN: [id number] Age: 69 Admit Type: Outpatient Procedure:                Colonoscopy Indications:              High risk colon cancer surveillance: Personal                            history of colonic polyps Providers:                Hildred Laser, MD, Janeece Riggers, RN, Nelma Rothman,                            Technician Referring MD:             Modena Nunnery. [place], MD Medicines:                Propofol per Anesthesia Complications:            No immediate complications. Estimated Blood Loss:     Estimated blood loss was minimal. Procedure:                Pre-Anesthesia Assessment:                           - Prior to the procedure, a History and Physical                            was performed, and patient medications and                            allergies were reviewed. The patient's tolerance of                            previous anesthesia was also reviewed. The risks                            and benefits of the procedure and the sedation                            options and risks were discussed with the patient.                            All questions were answered, and informed consent                            was obtained. Prior Anticoagulants: The patient has                            taken no previous anticoagulant or antiplatelet                            agents except for aspirin. ASA Grade Assessment:  III - A patient with severe systemic disease. After                            reviewing the risks and benefits, the patient was                            deemed in satisfactory condition to undergo the                            procedure.                           After obtaining informed consent, the colonoscope                            was passed under direct vision. Throughout  the                            procedure, the patient's blood pressure, pulse, and                            oxygen saturations were monitored continuously. The                            PCF-[id number] ([id number]) was introduced through the                            anus and advanced to the the cecum, identified by                            appendiceal orifice and ileocecal valve. The                            colonoscopy was performed without difficulty. The                            patient tolerated the procedure well. The quality                            of the bowel preparation was adequate. The                            ileocecal valve, appendiceal orifice, and rectum                            were photographed. Scope In: 7:47:58 AM Scope Out: 9:62:95 AM Scope Withdrawal Time: 0 hours 6 minutes 32 seconds  Total Procedure Duration: 0 hours 9 minutes 19 seconds  Findings:      The perianal and digital rectal examinations were normal.      A few diverticula were found in the sigmoid colon.      A 4 mm polyp was found in the rectum. The polyp was removed with a cold       snare. Resection and retrieval were complete.      External  hemorrhoids were found during retroflexion. The hemorrhoids       were small. Impression:               - Diverticulosis in the sigmoid colon.                           - One 4 mm polyp in the rectum, removed with a cold                            snare. Resected and retrieved.                           - External hemorrhoids. Moderate Sedation:      Per Anesthesia Care Recommendation:           - Patient has a contact number available for                            emergencies. The signs and symptoms of potential                            delayed complications were discussed with the                            patient. Return to normal activities tomorrow.                            Written discharge instructions were provided to the                             patient.                           - High fiber diet today.                           - Continue present medications.                           - No aspirin, ibuprofen, naproxen, or other                            non-steroidal anti-inflammatory drugs for 1 day.                           - Await pathology results.                           - Repeat colonoscopy is recommended. The                            colonoscopy date will be determined after pathology                            results from today's exam become available for  review. Procedure Code(s):        --- Professional ---                           [phone], Colonoscopy, flexible; with removal of                            tumor(s), polyp(s), or other lesion(s) by snare                            technique Diagnosis Code(s):        --- Professional ---                           Z86.010, Personal history of colonic polyps                           K64.4, Residual hemorrhoidal skin tags                           K62.1, Rectal polyp                           K57.30, Diverticulosis of large intestine without                            perforation or abscess without bleeding CPT copyright 2019 American [employer] Association. All rights reserved. The codes documented in this report are preliminary and upon coder review may  be revised to meet current compliance requirements. Hildred Laser, MD Hildred Laser, MD 10/04/2019 8:06:23 AM This report has been signed electronically. Number of Addenda: 0

## 2019-10-04 NOTE — Anesthesia Preprocedure Evaluation (Addendum)
Anesthesia Evaluation  Patient identified by MRN, date of birth, ID band Patient awake    Reviewed: Allergy & Precautions, H&P , NPO status , Patient's Chart, lab work & pertinent test results, reviewed documented beta blocker date and time   Airway Mallampati: III  TM Distance: >3 FB     Dental  (+) Edentulous Upper, Partial Lower   Pulmonary asthma , sleep apnea , former smoker,    Pulmonary exam normal        Cardiovascular hypertension, Normal cardiovascular exam     Neuro/Psych PSYCHIATRIC DISORDERS Anxiety Depression  Neuromuscular disease (Myasthenia gravis - left eye affected)    GI/Hepatic Neg liver ROS, GERD  ,  Endo/Other  negative endocrine ROS  Renal/GU negative Renal ROS     Musculoskeletal   Abdominal   Peds  Hematology negative hematology ROS (+)   Anesthesia Other Findings   Reproductive/Obstetrics negative OB ROS                            Anesthesia Physical Anesthesia Plan  ASA: III  Anesthesia Plan: General   Post-op Pain Management:    Induction:   PONV Risk Score and Plan: 3 and Propofol infusion  Airway Management Planned:   Additional Equipment:   Intra-op Plan:   Post-operative Plan:   Informed Consent: I have reviewed the patients History and Physical, chart, labs and discussed the procedure including the risks, benefits and alternatives for the proposed anesthesia with the patient or authorized representative who has indicated his/her understanding and acceptance.       Plan Discussed with: CRNA  Anesthesia Plan Comments:         Anesthesia Quick Evaluation

## 2019-10-04 NOTE — Discharge Instructions (Signed)
Resume aspirin on 10/05/2019 Resume other medications as before Resume usual diet. No driving for 24 hours. Physician will call with biopsy results next week.

## 2019-10-04 NOTE — Telephone Encounter (Signed)
Ok to refill??  Last office visit 09/18/2019.   Last refill 09/06/2019

## 2019-10-06 MED ORDER — HYDROCODONE-ACETAMINOPHEN 7.5-325 MG PO TABS: 1.0000 | ORAL_TABLET | Freq: Two times a day (BID) | ORAL | 0 refills | Status: DC | PRN

## 2019-10-07 LAB — SURGICAL PATHOLOGY

## 2019-10-10 ENCOUNTER — Ambulatory Visit (HOSPITAL_COMMUNITY)
Admission: RE | Admit: 2019-10-10 | Discharge: 2019-10-10 | Disposition: A | Discharge status: Home / Self Care | Payer: Medicare Other | Source: Ambulatory Visit | Attending: Family Medicine | Admitting: Family Medicine

## 2019-10-10 ENCOUNTER — Other Ambulatory Visit: Payer: Self-pay

## 2019-10-10 DIAGNOSIS — Z1231 Encounter for screening mammogram for malignant neoplasm of breast: Secondary | ICD-10-CM | POA: Diagnosis not present

## 2019-10-15 ENCOUNTER — Encounter: Payer: Self-pay | Admitting: Family Medicine

## 2019-10-21 DIAGNOSIS — J449 Chronic obstructive pulmonary disease, unspecified: Secondary | ICD-10-CM | POA: Diagnosis not present

## 2019-11-02 ENCOUNTER — Other Ambulatory Visit: Payer: Self-pay | Admitting: Family Medicine

## 2019-11-06 ENCOUNTER — Other Ambulatory Visit: Payer: Self-pay | Admitting: Family Medicine

## 2019-11-06 MED ORDER — HYDROCODONE-ACETAMINOPHEN 7.5-325 MG PO TABS: 1.0000 | ORAL_TABLET | Freq: Two times a day (BID) | ORAL | 0 refills | Status: DC | PRN

## 2019-11-06 NOTE — Telephone Encounter (Signed)
Ok to refill??  Last office visit 09/18/2019.  Last refill 10/06/2019.

## 2019-11-06 NOTE — Telephone Encounter (Signed)
Pt need refill on HYDROcodone-acetaminophen (NORCO) 7.5-325 MG tablet  [facility]  [phone]

## 2019-11-21 ENCOUNTER — Other Ambulatory Visit: Payer: Self-pay | Admitting: Family Medicine

## 2019-11-21 DIAGNOSIS — J449 Chronic obstructive pulmonary disease, unspecified: Secondary | ICD-10-CM | POA: Diagnosis not present

## 2019-12-05 ENCOUNTER — Other Ambulatory Visit: Payer: Self-pay | Admitting: *Deleted

## 2019-12-05 MED ORDER — HYDROCODONE-ACETAMINOPHEN 7.5-325 MG PO TABS: 1.0000 | ORAL_TABLET | Freq: Two times a day (BID) | ORAL | 0 refills | Status: DC | PRN

## 2019-12-05 NOTE — Telephone Encounter (Signed)
Received call from patient.   Requested refill on Hydrocodone/APAP.   Ok to refill??  Last office visit 09/18/2019.  Last refill 11/06/2019.

## 2019-12-15 ENCOUNTER — Other Ambulatory Visit: Payer: Self-pay | Admitting: Family Medicine

## 2019-12-21 DIAGNOSIS — J449 Chronic obstructive pulmonary disease, unspecified: Secondary | ICD-10-CM | POA: Diagnosis not present

## 2020-01-06 ENCOUNTER — Other Ambulatory Visit: Payer: Self-pay | Admitting: *Deleted

## 2020-01-06 ENCOUNTER — Telehealth: Payer: Self-pay | Admitting: *Deleted

## 2020-01-06 MED ORDER — HYDROCODONE-ACETAMINOPHEN 7.5-325 MG PO TABS: 1.0000 | ORAL_TABLET | Freq: Two times a day (BID) | ORAL | 0 refills | Status: DC | PRN

## 2020-01-06 NOTE — Telephone Encounter (Signed)
Agree with below Appt made

## 2020-01-06 NOTE — Telephone Encounter (Signed)
Received call from patient.   Reports that she has some irritation under breasts. Reports that she has an open area that she can't really see under breast,but it is draining blood tinged fluid in bra. States that area is not painful, but sometimes feels tender.   Reports that she has stopped waring bras at this time to avoid more irritation. Reports that she is applying Neosporin to area.   Advised to continue to wash with warm soapy water and to pat dry. Advised to not scrub at area. Advised to continue Neosporin until seen by PCP.   Appointment scheduled to evaluate.

## 2020-01-06 NOTE — Telephone Encounter (Signed)
Received call from patient.   Requested refill on Hydrocodone/APAP.   Ok to refill??  Last office visit 09/18/2019.  Last refill 12/05/2019.

## 2020-01-08 ENCOUNTER — Ambulatory Visit (INDEPENDENT_AMBULATORY_CARE_PROVIDER_SITE_OTHER): Payer: Medicare Other | Admitting: Family Medicine

## 2020-01-08 ENCOUNTER — Encounter: Payer: Self-pay | Admitting: Family Medicine

## 2020-01-08 ENCOUNTER — Other Ambulatory Visit: Payer: Self-pay

## 2020-01-08 VITALS — BP 142/82 | HR 72 | Temp 98.7°F | Resp 14 | Ht 65.0 in | Wt 274.0 lb

## 2020-01-08 DIAGNOSIS — F5104 Psychophysiologic insomnia: Secondary | ICD-10-CM

## 2020-01-08 DIAGNOSIS — E66813 Obesity, class 3: Secondary | ICD-10-CM

## 2020-01-08 DIAGNOSIS — E785 Hyperlipidemia, unspecified: Secondary | ICD-10-CM

## 2020-01-08 DIAGNOSIS — E669 Obesity, unspecified: Secondary | ICD-10-CM

## 2020-01-08 DIAGNOSIS — I10 Essential (primary) hypertension: Secondary | ICD-10-CM | POA: Diagnosis not present

## 2020-01-08 DIAGNOSIS — B372 Candidiasis of skin and nail: Secondary | ICD-10-CM

## 2020-01-08 DIAGNOSIS — G7000 Myasthenia gravis without (acute) exacerbation: Secondary | ICD-10-CM

## 2020-01-08 LAB — CBC WITH DIFFERENTIAL/PLATELET
Absolute Monocytes: 582 cells/uL (ref 200–950)
Basophils Absolute: 48 cells/uL (ref 0–200)
Basophils Relative: 0.8 %
Eosinophils Absolute: 282 cells/uL (ref 15–500)
Eosinophils Relative: 4.7 %
HCT: 41.2 % (ref 35.0–45.0)
Hemoglobin: 13.2 g/dL (ref 11.7–15.5)
Lymphs Abs: 1956 cells/uL (ref 850–3900)
MCH: 29.5 pg (ref 27.0–33.0)
MCHC: 32 g/dL (ref 32.0–36.0)
MCV: 92 fL (ref 80.0–100.0)
MPV: 10.5 fL (ref 7.5–12.5)
Monocytes Relative: 9.7 %
Neutro Abs: 3132 cells/uL (ref 1500–7800)
Neutrophils Relative %: 52.2 %
Platelets: 234 10*3/uL (ref 140–400)
RBC: 4.48 10*6/uL (ref 3.80–5.10)
RDW: 13.1 % (ref 11.0–15.0)
Total Lymphocyte: 32.6 %
WBC: 6 10*3/uL (ref 3.8–10.8)

## 2020-01-08 LAB — LIPID PANEL
Cholesterol: 140 mg/dL (ref <200)
HDL: 56 mg/dL (ref >=50)
LDL Cholesterol (Calc): 61 mg/dL (calc)
Non-HDL Cholesterol (Calc): 84 mg/dL (calc) (ref <130)
Total CHOL/HDL Ratio: 2.5 (calc) (ref <5.0)
Triglycerides: 144 mg/dL (ref <150)

## 2020-01-08 LAB — COMPREHENSIVE METABOLIC PANEL
AG Ratio: 1.2 (calc) (ref 1.0–2.5)
ALT: 10 U/L (ref 6–29)
AST: 14 U/L (ref 10–35)
Albumin: 3.8 g/dL (ref 3.6–5.1)
Alkaline phosphatase (APISO): 87 U/L (ref 37–153)
BUN/Creatinine Ratio: 8 (calc) (ref 6–22)
BUN: 9 mg/dL (ref 7–25)
CO2: 30 mmol/L (ref 20–32)
Calcium: 8.8 mg/dL (ref 8.6–10.4)
Chloride: 107 mmol/L (ref 98–110)
Creat: 1.07 mg/dL — ABNORMAL HIGH (ref 0.60–0.93)
Globulin: 3.1 g/dL (calc) (ref 1.9–3.7)
Glucose, Bld: 105 mg/dL — ABNORMAL HIGH (ref 65–99)
Potassium: 5.3 mmol/L (ref 3.5–5.3)
Sodium: 142 mmol/L (ref 135–146)
Total Bilirubin: 0.5 mg/dL (ref 0.2–1.2)
Total Protein: 6.9 g/dL (ref 6.1–8.1)

## 2020-01-08 MED ORDER — NYSTATIN 100000 UNIT/GM EX POWD: 1.0000 "application " | Freq: Three times a day (TID) | CUTANEOUS | 1 refills | Status: DC

## 2020-01-08 MED ORDER — CLOTRIMAZOLE 1 % EX CREA: 1.0000 "application " | TOPICAL_CREAM | Freq: Two times a day (BID) | CUTANEOUS | 1 refills | Status: DC

## 2020-01-08 MED ORDER — TRAZODONE HCL 100 MG PO TABS: ORAL_TABLET | ORAL | 2 refills | Status: DC

## 2020-01-08 NOTE — Progress Notes (Signed)
   Subjective:    Patient ID: [name], female    DOB: 1951-07-02, 69 y.o.   MRN: [id number]  Patient presents for Follow-up (is not fasting), Rash (x2 weeks- irritation under breasts), and Insomnia (trazodone is not helping her stay asleep)   Pt here with rash beneath breast for the past 2 weeks. She has been using neosporin. She noticed blood in her bra, no pus noted, she does have tenderness in the area   Insomnia- taking trazodone 50mg , she falls asleep okay but then wakes up after about 2 hours. Then takes her a while to get back to sleep, often has to get and go to living room    HTN- she has taken losartan 50mg  today   Hyperlipidemia- lipitor increased in Feb to  40mg       Review Of Systems:  GEN- denies fatigue, fever, weight loss,weakness, recent illness HEENT- denies eye drainage, change in vision, nasal discharge, CVS- denies chest pain, palpitations RESP- denies SOB, cough, wheeze ABD- denies N/V, change in stools, abd pain GU- denies dysuria, hematuria, dribbling, incontinence MSK- denies joint pain, muscle aches, injury Neuro- denies headache, dizziness, syncope, seizure activity       Objective:    BP (!) 142/82   Pulse 72   Temp 98.7 F (37.1 C) (Temporal)   Resp 14   Ht 5\' 5"  (1.651 m)   Wt 274 lb (124.3 kg)   SpO2 98%   BMI 45.60 kg/m  GEN- NAD, alert and oriented x3 HEENT- PERRL, EOMI, non injected sclera, pink conjunctiva, MMM, oropharynx clear Neck- Supple, no thyromegaly CVS- RRR, no murmur  Breast- normal symmetry, no nipple inversion,no nipple drainage, no nodules or lumps felt on left breast Skin beneath left breast with moisture 1x 0.5 inch area of erythema with macereation at center - small break in skin no drainage , mild TTP  Nodes- no axillary nodes RESP-CTAB ABD-NABS,soft,NT,ND EXT- pedal  edema Pulses- Radial, DP- 2+        Assessment & Plan:      Problem List Items Addressed This Visit      Unprioritized    Chronic insomnia    Increase trazodone to 100mg  once a day       Class 3 obesity   Essential hypertension - Primary    Bp at baseline, no changes       Relevant Orders   CBC with Differential/Platelet   Comprehensive metabolic panel   Hyperlipidemia    Recheck lipids on higher dose of statin and LFT      Relevant Orders   Lipid panel   Ocular myasthenia gravis ([place])    F/u neurology, taking meds as prescribed which help symptoms      Relevant Medications   traZODone (DESYREL) 100 MG tablet    Other Visit Diagnoses    Candidal intertrigo       Treat with topical clotrimazole advised she can use nystatin powder to help moisture and irritation before the skin breaks   Relevant Medications   nystatin (MYCOSTATIN/NYSTOP) powder   clotrimazole (LOTRIMIN) 1 % cream      Note: This dictation was prepared with Dragon dictation along with smaller phrase technology. Any transcriptional errors that result from this process are unintentional.

## 2020-01-08 NOTE — Patient Instructions (Addendum)
Use the topical cream and the powder We will call with lab results F/U 4 months for Physical

## 2020-01-08 NOTE — Assessment & Plan Note (Signed)
F/u neurology, taking meds as prescribed which help symptoms

## 2020-01-08 NOTE — Assessment & Plan Note (Signed)
Recheck lipids on higher dose of statin and LFT

## 2020-01-08 NOTE — Assessment & Plan Note (Signed)
Bp at baseline, no changes

## 2020-01-08 NOTE — Assessment & Plan Note (Signed)
Increase trazodone to 100mg  once a day

## 2020-01-10 ENCOUNTER — Encounter: Payer: Self-pay | Admitting: *Deleted

## 2020-01-17 ENCOUNTER — Ambulatory Visit: Payer: Medicare Other | Admitting: Family Medicine

## 2020-01-21 ENCOUNTER — Ambulatory Visit (INDEPENDENT_AMBULATORY_CARE_PROVIDER_SITE_OTHER): Payer: Medicare Other | Admitting: Diagnostic Neuroimaging

## 2020-01-21 ENCOUNTER — Encounter: Payer: Self-pay | Admitting: Diagnostic Neuroimaging

## 2020-01-21 VITALS — BP 145/78 | HR 71 | Ht 65.0 in | Wt 273.2 lb

## 2020-01-21 DIAGNOSIS — G7000 Myasthenia gravis without (acute) exacerbation: Secondary | ICD-10-CM

## 2020-01-21 DIAGNOSIS — J449 Chronic obstructive pulmonary disease, unspecified: Secondary | ICD-10-CM | POA: Diagnosis not present

## 2020-01-21 MED ORDER — PYRIDOSTIGMINE BROMIDE ER 180 MG PO TBCR: 180.0000 mg | EXTENDED_RELEASE_TABLET | Freq: Every day | ORAL | 4 refills | Status: DC

## 2020-01-21 MED ORDER — PYRIDOSTIGMINE BROMIDE 60 MG PO TABS: 60.0000 mg | ORAL_TABLET | Freq: Two times a day (BID) | ORAL | 4 refills | Status: DC

## 2020-01-21 NOTE — Progress Notes (Signed)
[facility]  PATIENT: [name] DOB: 1950/09/22  REFERRING CLINICIAN: Mincey HISTORY FROM: patient  REASON FOR VISIT: follow up   HISTORICAL  CHIEF COMPLAINT:  Chief Complaint  Patient presents with  . Ocular Myasthenia    rm 7, 4 month FU  "no new concerns"    HISTORY OF PRESENT ILLNESS:   UPDATE (01/21/20, VRP): Since last visit, doing well. Symptoms are stable. Severity is mild. No alleviating or aggravating factors. Tolerating pyridostigmine.    UPDATE (09/17/19, VRP): Since last visit, doing worse with left eye ptosis and double vision. Symptoms are progressive. No breathing issues. Some gen weakness. Tolerating pyridostigmine 60mg  three times a day.    UPDATE (02/06/19, VRP): Since last visit, doing about the same. Symptoms are stable. Severity is mild. No alleviating or aggravating factors. Tolerating pyridostigmine. Mild intermittent left ptosis. No SOB, chewing or swallowing issues.   UPDATE (01/02/18, VRP): Since last visit, doing well. Tolerating meds. No alleviating or aggravating factors. No SOB, swallow diff, speech diff. Vision stable.   UPDATE (07/05/17, VRP): Since last visit, doing well. Tolerating pyridostigmine (60mg  three times a day). No alleviating or aggravating factors. Eyes are better. Has some flare of sxs every month (2x per month; few hrs each time). No breathing, swallowing or speaking problems. CT chest reviewed with patient.   PRIOR HPI (03/22/17): 69 year old female with hypertension, hypercholesterolemia, anxiety, here for evaluation of double vision. 6 weeks ago patient had sudden onset of redness in both eyes. She then developed double vision and left eyelid drooping. She went to eye doctor, then referred to emergency room for MRI testing to rule out stroke. MRI of the brain and orbits were negative for acute process. Patient then had myasthenia gravis antibody panel testing which was notable for borderline positive  anti-striation autoantibodies. Patient for here for further evaluation and management. Patient also reports a number of other symptoms including fatigue, swelling in legs, wheezing, headache, numbness, weakness, slurred speech. However these symptoms do not correlate with the recent 6 weeks of double vision and left eyelid weakness. Patient has been using an eye patch due to her double vision. Symptoms have been fairly consistent since onset. However later in her conversation patient states that symptoms fluctuate and are worse in the evening.   REVIEW OF SYSTEMS: Full 14 system review of systems performed and negative with exception of: as per HPI.    ALLERGIES: No Known Allergies  HOME MEDICATIONS: Outpatient Medications Prior to Visit  Medication Sig Dispense Refill  . acetaminophen (TYLENOL) 500 MG tablet Take 1,000 mg by mouth every 6 (six) hours as needed for mild pain.     Marland Kitchen albuterol (VENTOLIN HFA) 108 (90 Base) MCG/ACT inhaler Inhale 2 puffs into the lungs every 4 (four) hours as needed for wheezing or shortness of breath. 18 g 2  . allopurinol (ZYLOPRIM) 100 MG tablet Take 1 tablet (100 mg total) by mouth daily. 90 tablet 3  . aspirin EC 81 MG tablet Take 1 tablet (81 mg total) by mouth every evening.    Marland Kitchen atorvastatin (LIPITOR) 40 MG tablet Take 1 tablet (40 mg total) by mouth daily at 6 PM. (Patient taking differently: Take 40 mg by mouth daily. ) 90 tablet 1  . clotrimazole (LOTRIMIN) 1 % cream Apply 1 application topically 2 (two) times daily. 30 g 1  . diphenhydrAMINE (BENADRYL) 25 MG tablet Take 25 mg by mouth daily.     . fluticasone (CUTIVATE) 0.05 % cream APPLY EXTERNALLY TO  THE AFFECTED AREA DAILY (Patient taking differently: Apply 1 application topically daily as needed (rash). ) 30 g 3  . furosemide (LASIX) 20 MG tablet Take 1 tablet (20 mg total) by mouth daily. TAKE 1 TABLET(20 MG) BY MOUTH DAILY FOR SWELLIN 90 tablet 0  . HYDROcodone-acetaminophen (NORCO) 7.5-325 MG  tablet Take 1 tablet by mouth 2 (two) times daily as needed for moderate pain. 60 tablet 0  . losartan (COZAAR) 50 MG tablet TAKE 1 TABLET BY MOUTH DAILY 90 tablet 3  . Magnesium 250 MG TABS Take 1 tablet (250 mg total) by mouth daily. (Patient taking differently: Take 250 mg by mouth daily in the afternoon. Midday) 30 tablet 11  . nystatin (MYCOSTATIN/NYSTOP) powder Apply 1 application topically 3 (three) times daily. 60 g 1  . omeprazole (PRILOSEC) 40 MG capsule TAKE 1 CAPSULE(40 MG) BY MOUTH DAILY (Patient taking differently: Take 40 mg by mouth every evening. ) 90 capsule 3  . OXYGEN Inhale 1 L into the lungs at bedtime. IN ADDITION TO CPAP NIGHTLY    . pyridostigmine (MESTINON) 180 MG CR tablet Take 1 tablet (180 mg total) by mouth daily. (Patient taking differently: Take 180 mg by mouth at bedtime. ) 30 tablet 12  . pyridostigmine (MESTINON) 60 MG tablet Take 1 tablet (60 mg total) by mouth 2 (two) times daily. (Patient taking differently: Take 60 mg by mouth 2 (two) times daily. Morning & midday) 60 tablet 12  . traZODone (DESYREL) 100 MG tablet TAKE 1 tablet BY MOUTH AT BEDTIME AS NEEDED FOR SLEEP 30 tablet 2   No facility-administered medications prior to visit.    PAST MEDICAL HISTORY: Past Medical History:  Diagnosis Date  . Achilles tendinitis   . Anxiety   . Asthma   . Depression   . GERD (gastroesophageal reflux disease)   . Gout   . H/O myasthenia gravis    left eye  . HTN (hypertension)   . Hyperlipidemia   . Low back pain   . Obesity hypoventilation syndrome ([place])   . Obstructive sleep apnea   . Osteoarthritis     PAST SURGICAL HISTORY: Past Surgical History:  Procedure Laterality Date  . CARDIAC CATHETERIZATION  2006  . CATARACT EXTRACTION W/PHACO Left 09/17/2015   Procedure: CATARACT EXTRACTION PHACO AND INTRAOCULAR LENS PLACEMENT LEFT EYE cde=8.58;  Surgeon: Tonny Branch, MD;  Location: [facility];  Service: Ophthalmology;  Laterality: Left;  . CATARACT EXTRACTION  W/PHACO Right 05/15/2017   Procedure: CATARACT EXTRACTION PHACO AND INTRAOCULAR LENS PLACEMENT (IOC);  Surgeon: Tonny Branch, MD;  Location: [facility];  Service: Ophthalmology;  Laterality: Right;  CDE: 6.34  . COLONOSCOPY N/A 12/25/2013   Procedure: COLONOSCOPY;  Surgeon: Rogene Houston, MD;  Location: [facility];  Service: Endoscopy;  Laterality: N/A;  730-rescheduled Ann notified pt  . COLONOSCOPY WITH PROPOFOL N/A 10/04/2019   Procedure: COLONOSCOPY WITH PROPOFOL;  Surgeon: Rogene Houston, MD;  Location: [facility];  Service: Endoscopy;  Laterality: N/A;  815  . CYST EXCISION N/A 09/12/2016   Procedure: EXCISION SEBACEOUS CYST, BACK;  Surgeon: Aviva Signs, MD;  Location: [facility];  Service: General;  Laterality: N/A;  . INTRAOCULAR LENS INSERTION Bilateral 2018   Dr. Tonny Branch   . POLYPECTOMY  10/04/2019   Procedure: POLYPECTOMY;  Surgeon: Rogene Houston, MD;  Location: [facility];  Service: Endoscopy;;  . TUBAL LIGATION      FAMILY HISTORY: Family History  Problem Relation Age of Onset  . Heart  disease Mother   . Thyroid disease Mother   . Heart failure Father   . Lung disease Father   . Diabetes Brother   . Crohn's disease Daughter     SOCIAL HISTORY:  Social History   Socioeconomic History  . Marital status: Widowed    Spouse name: Not on file  . Number of children: 3  . Years of education: 55  . Highest education level: Not on file  Occupational History  . Occupation: disabled    Fish farm manager: UNEMPLOYED  Tobacco Use  . Smoking status: Former Smoker    Packs/day: 2.00    Years: 36.00    Pack years: 72.00    Types: Cigarettes    Start date: 06/25/1969    Quit date: 03/25/2005    Years since quitting: 14.8  . Smokeless tobacco: Never Used  Vaping Use  . Vaping Use: Never used  Substance and Sexual Activity  . Alcohol use: No    Alcohol/week: 0.0 standard drinks  . Drug use: No  . Sexual activity: Never    Birth control/protection: Post-menopausal  Other  Topics Concern  . Not on file  Social History Narrative   Originally from [place]. She has always lived in [place]. 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 No pets currently. No bird, mold, or hot tub exposure.    Lives alone   No caffeine   Social Determinants of Health   Financial Resource Strain:   . Difficulty of Paying Living Expenses:   Food Insecurity:   . Worried About Charity fundraiser in the Last Year:   . Arboriculturist in the Last Year:   Transportation Needs:   . Film/video editor (Medical):   Marland Kitchen Lack of Transportation (Non-Medical):   Physical Activity:   . Days of Exercise per Week:   . Minutes of Exercise per Session:   Stress:   . Feeling of Stress :   Social Connections:   . Frequency of Communication with Friends and Family:   . Frequency of Social Gatherings with Friends and Family:   . Attends Religious Services:   . Active Member of Clubs or Organizations:   . Attends Archivist Meetings:   Marland Kitchen Marital Status:   Intimate Partner Violence:   . Fear of Current or Ex-Partner:   . Emotionally Abused:   Marland Kitchen Physically Abused:   . Sexually Abused:      PHYSICAL EXAM  GENERAL EXAM/CONSTITUTIONAL: Vitals:  Vitals:   01/21/20 0948  BP: (!) 145/78  Pulse: 71  Weight: 273 lb 3.2 oz (123.9 kg)  Height: 5\' 5"  (1.651 m)   Body mass index is 45.46 kg/m. No exam data present  Patient is in no distress; well developed, nourished and groomed; neck is supple  CARDIOVASCULAR:  Examination of carotid arteries is normal; no carotid bruits  Regular rate and rhythm, no murmurs  Examination of peripheral vascular system by observation and palpation is normal  EYES:  Ophthalmoscopic exam of optic discs and posterior segments is normal; no papilledema or hemorrhages  MUSCULOSKELETAL:  Gait, strength, tone, movements noted in Neurologic exam below  NEUROLOGIC: MENTAL STATUS:  No flowsheet data found.  awake,  alert, oriented to person, place and time  recent and remote memory intact  normal attention and concentration  language fluent, comprehension intact, naming intact,   fund of knowledge appropriate  CRANIAL NERVE:   2nd - no papilledema on fundoscopic exam  2nd, 3rd,  4th, 6th - pupils equal and reactive to light, visual fields full to confrontation, extraocular muscles intact, MILD LEFT PTOSIS; NO DOUBLE VISION   5th - facial sensation symmetric  7th - facial strength symmetric  8th - hearing intact  9th - palate elevates symmetrically, uvula midline  11th - shoulder shrug symmetric  12th - tongue protrusion midline  MOTOR:   normal bulk and tone, full strength in the BUE, BLE  SENSORY:   normal and symmetric to light touch  COORDINATION:   finger-nose-finger, fine finger movements normal  REFLEXES:   deep tendon reflexes TRACE and symmetric  GAIT/STATION:   narrow based gait    DIAGNOSTIC DATA (LABS, IMAGING, TESTING) - I reviewed patient records, labs, notes, testing and imaging myself where available.  Lab Results  Component Value Date   WBC 6.0 01/08/2020   HGB 13.2 01/08/2020   HCT 41.2 01/08/2020   MCV 92.0 01/08/2020   PLT 234 01/08/2020      Component Value Date/Time   NA 142 01/08/2020 1225   K 5.3 01/08/2020 1225   CL 107 01/08/2020 1225   CO2 30 01/08/2020 1225   GLUCOSE 105 (H) 01/08/2020 1225   BUN 9 01/08/2020 1225   CREATININE 1.07 (H) 01/08/2020 1225   CALCIUM 8.8 01/08/2020 1225   PROT 6.9 01/08/2020 1225   ALBUMIN 3.5 02/24/2017 0938   AST 14 01/08/2020 1225   ALT 10 01/08/2020 1225   ALKPHOS 71 02/24/2017 0938   BILITOT 0.5 01/08/2020 1225   GFRNONAA 46 (L) 02/24/2017 0938   GFRAA 54 (L) 02/24/2017 0938   Lab Results  Component Value Date   CHOL 140 01/08/2020   HDL 56 01/08/2020   LDLCALC 61 01/08/2020   LDLDIRECT 157 (H) 02/25/2013   TRIG 144 01/08/2020   CHOLHDL 2.5 01/08/2020   Lab Results  Component  Value Date   HGBA1C 5.7 (H) 09/18/2019   Lab Results  Component Value Date   VITAMINB12 450 01/25/2012   Lab Results  Component Value Date   TSH 1.766 01/25/2012    02/24/17 MRI brain / orbits [I reviewed images myself and agree with interpretation. -VRP]  - No explanation for symptoms. No infarct, compressive lesion, or neuritis findings.  03/01/17 labs - anti AchR binding, blocking, modulation - normal - anti striational ab - 1:40 (h)  04/05/17 CT chest - No evidence of residual thymic tissue or or thymoma. - No acute abnormality noted.    ASSESSMENT AND PLAN  69 y.o. year old female here with new onset of double vision left eyelid ptosis, with positive ice pack test in office, and slightly positive anti-striation all antibody testing. Findings consistent with ocular myasthenia gravis.   Dx:  1. Ocular myasthenia (HCC)      PLAN:  MYASTHENIA GRAVIS (ocular)  - continue pyridostigmine to 60mg  in AM and 60mg  at noon  - continue pyridostigmine 180mg  extended release at bedtime  - may consider additional immunosuppressant therapy if symptoms worsen (i.e. prednisone); patient will ask PCP about BP and weight control and covid vaccine before starting prednisone  - planning to get covid vaccine soon  - cautioned patient if she develops breathing, chewing, swallowing sxs; patient to contact us right away or go to ER if bulbar or generalized symptoms develop  Orders Placed This Encounter  Procedures  . AChR Abs with Reflex to MuSK   Meds ordered this encounter  Medications  . pyridostigmine (MESTINON) 180 MG CR tablet    Sig: Take 1 tablet (  180 mg total) by mouth at bedtime.    Dispense:  90 tablet    Refill:  4  . pyridostigmine (MESTINON) 60 MG tablet    Sig: Take 1 tablet (60 mg total) by mouth 2 (two) times daily.    Dispense:  180 tablet    Refill:  4   Return in about 9 months (around 10/20/2020).    Penni Bombard, MD 9/62/8366, 2:94 AM Certified  in Neurology, Neurophysiology and Neuroimaging  [facility] [address] [place], [place] [address] [phone]

## 2020-01-23 ENCOUNTER — Other Ambulatory Visit: Payer: Self-pay | Admitting: Family Medicine

## 2020-02-01 LAB — ACHR ABS WITH REFLEX TO MUSK: AChR Binding Ab, Serum: 0.05 nmol/L (ref 0.00–0.24)

## 2020-02-01 LAB — MUSK ANTIBODIES: MuSK Antibodies: <1 U/mL

## 2020-02-05 ENCOUNTER — Other Ambulatory Visit: Payer: Self-pay | Admitting: Family Medicine

## 2020-02-05 MED ORDER — HYDROCODONE-ACETAMINOPHEN 7.5-325 MG PO TABS: 1.0000 | ORAL_TABLET | Freq: Two times a day (BID) | ORAL | 0 refills | Status: DC | PRN

## 2020-02-05 NOTE — Telephone Encounter (Signed)
CB# [phone] Refill Hydrocodone

## 2020-02-05 NOTE — Telephone Encounter (Signed)
Ok to refill??  Last office visit 01/08/2020.  Last refill 01/06/2020.

## 2020-02-06 ENCOUNTER — Telehealth: Payer: Self-pay | Admitting: *Deleted

## 2020-02-06 NOTE — Telephone Encounter (Addendum)
Spoke with patient and informed her labs are normal. She stated she received her first Covid vaccine, Moderma 2nd due August 4th. She stated Dr Leta Baptist wanted her to get vaccine prior to putting her on Prednisone.  Patient verbalized understanding, appreciation.

## 2020-02-18 ENCOUNTER — Other Ambulatory Visit: Payer: Self-pay | Admitting: Family Medicine

## 2020-02-18 MED ORDER — ATORVASTATIN CALCIUM 40 MG PO TABS: 40.0000 mg | ORAL_TABLET | Freq: Every day | ORAL | 1 refills | Status: DC

## 2020-02-19 ENCOUNTER — Encounter: Payer: Self-pay | Admitting: Obstetrics & Gynecology

## 2020-02-19 ENCOUNTER — Ambulatory Visit (INDEPENDENT_AMBULATORY_CARE_PROVIDER_SITE_OTHER): Payer: Medicare Other | Admitting: Obstetrics & Gynecology

## 2020-02-19 ENCOUNTER — Other Ambulatory Visit: Payer: Self-pay

## 2020-02-19 VITALS — BP 128/84 | Ht 64.5 in | Wt 272.0 lb

## 2020-02-19 DIAGNOSIS — Z6841 Body Mass Index (BMI) 40.0 and over, adult: Secondary | ICD-10-CM

## 2020-02-19 DIAGNOSIS — Z01419 Encounter for gynecological examination (general) (routine) without abnormal findings: Secondary | ICD-10-CM

## 2020-02-19 DIAGNOSIS — Z780 Asymptomatic menopausal state: Secondary | ICD-10-CM

## 2020-02-19 NOTE — Progress Notes (Signed)
[name] May 08, 1951 [id number]   History:    69 y.o.  G3P3L3 Engaged/Getting married  RP:  Established patient presenting for annual gyn exam   HPI: Menopause, well on no HRT.  No PMB.  No pelvic pain.  Abstinent.  Urine/BMs normal.  Breasts normal, except for soreness of skin beneath the breasts.  BMI 45.97.  Not exercising, limited by arthritis. Health labs with Fam MD.     Past medical history,surgical history, family history and social history were all reviewed and documented in the EPIC chart.  Gynecologic History No LMP recorded. Patient is postmenopausal.  Obstetric History OB History  Gravida Para Term Preterm AB Living  3 3 3     3   SAB TAB Ectopic Multiple Live Births               # Outcome Date GA Lbr Len/2nd Weight Sex Delivery Anes PTL Lv  3 Term           2 Term           1 Term              ROS: A ROS was performed and pertinent positives and negatives are included in the history.  GENERAL: No fevers or chills. HEENT: No change in vision, no earache, sore throat or sinus congestion. NECK: No pain or stiffness. CARDIOVASCULAR: No chest pain or pressure. No palpitations. PULMONARY: No shortness of breath, cough or wheeze. GASTROINTESTINAL: No abdominal pain, nausea, vomiting or diarrhea, melena or bright red blood per rectum. GENITOURINARY: No urinary frequency, urgency, hesitancy or dysuria. MUSCULOSKELETAL: No joint or muscle pain, no back pain, no recent trauma. DERMATOLOGIC: No rash, no itching, no lesions. ENDOCRINE: No polyuria, polydipsia, no heat or cold intolerance. No recent change in weight. HEMATOLOGICAL: No anemia or easy bruising or bleeding. NEUROLOGIC: No headache, seizures, numbness, tingling or weakness. PSYCHIATRIC: No depression, no loss of interest in normal activity or change in sleep pattern.     Exam:   BP 128/84   Ht 5' 4.5" (1.638 m)   Wt (!) 272 lb (123.4 kg)   BMI 45.97 kg/m   Body mass index is 45.97 kg/m.  General  appearance : Well developed well nourished female. No acute distress HEENT: Eyes: no retinal hemorrhage or exudates,  Neck supple, trachea midline, no carotid bruits, no thyroidmegaly Lungs: Clear to auscultation, no rhonchi or wheezes, or rib retractions  Heart: Regular rate and rhythm, no murmurs or gallops Breast:Examined in sitting and supine position were symmetrical in appearance, no palpable masses or tenderness,  no skin retraction, no nipple inversion, no nipple discharge, no skin discoloration, no axillary or supraclavicular lymphadenopathy Abdomen: no palpable masses or tenderness, no rebound or guarding Extremities: no edema or skin discoloration or tenderness  Pelvic: Vulva: Normal             Vagina: No gross lesions or discharge  Cervix: No gross lesions or discharge  Uterus  AV, normal size, shape and consistency, non-tender and mobile  Adnexa  Without masses or tenderness  Anus: Normal   Assessment/Plan:  69 y.o. female for annual exam   1. Well female exam with routine gynecological exam Normal gynecologic exam in menopause.  No indication to repeat a Pap test this year.  Breast exam normal.  Screening mammogram March 2021.  Colonoscopy 2021.  Health labs with family physician.  2. Postmenopause Well on no hormone replacement therapy.  No postmenopausal bleeding.  Bone density in  2015 was normal.  Recommend repeating a bone density now.  Vitamin D supplements, calcium intake of 1200 mg daily and regular weightbearing physical activities.  3. Class 3 severe obesity due to excess calories with serious comorbidity and body mass index (BMI) of 45.0 to 49.9 in adult [facility]) Recommend a lower calorie/carb diet such as [employer].  Aerobic physical activities as much as possible given arthritis.  Princess Bruins MD, 11:18 AM 02/19/2020

## 2020-02-20 DIAGNOSIS — J449 Chronic obstructive pulmonary disease, unspecified: Secondary | ICD-10-CM | POA: Diagnosis not present

## 2020-02-22 ENCOUNTER — Encounter: Payer: Self-pay | Admitting: Obstetrics & Gynecology

## 2020-02-26 NOTE — Telephone Encounter (Addendum)
Called patient who stated she got 2nd Covid vaccine today. I advised her of her FU date/time and Dr Leta Baptist will discuss possible myasthenia medications at that appointment. Patient verbalized understanding, appreciation.

## 2020-02-27 ENCOUNTER — Other Ambulatory Visit: Payer: Self-pay | Admitting: Family Medicine

## 2020-03-01 ENCOUNTER — Other Ambulatory Visit: Payer: Self-pay | Admitting: Family Medicine

## 2020-03-06 ENCOUNTER — Other Ambulatory Visit: Payer: Self-pay

## 2020-03-06 MED ORDER — HYDROCODONE-ACETAMINOPHEN 7.5-325 MG PO TABS: 1.0000 | ORAL_TABLET | Freq: Two times a day (BID) | ORAL | 0 refills | Status: DC | PRN

## 2020-03-06 NOTE — Telephone Encounter (Signed)
Requested Prescriptions   Pending Prescriptions Disp Refills  . HYDROcodone-acetaminophen (NORCO) 7.5-325 MG tablet 60 tablet 0    Sig: Take 1 tablet by mouth 2 (two) times daily as needed for moderate pain.     Last OV 01/08/2020   Last written 02/05/2020

## 2020-03-12 ENCOUNTER — Telehealth: Payer: Self-pay | Admitting: Family Medicine

## 2020-03-12 NOTE — Progress Notes (Signed)
  Chronic Care Management   Outreach Note  03/12/2020 Name: ROSHELL [name] MRN: [id number] DOB: [birth year]  Referred by: Alycia Rossetti, MD Reason for referral : No chief complaint on file.   An unsuccessful telephone outreach was attempted today. The patient was referred to the pharmacist for assistance with care management and care coordination.   Follow Up Plan:   Carley Perdue [employer] Scheduler

## 2020-03-13 ENCOUNTER — Other Ambulatory Visit: Payer: Self-pay | Admitting: Family Medicine

## 2020-03-22 DIAGNOSIS — J449 Chronic obstructive pulmonary disease, unspecified: Secondary | ICD-10-CM | POA: Diagnosis not present

## 2020-03-26 ENCOUNTER — Telehealth: Payer: Self-pay | Admitting: Family Medicine

## 2020-03-26 NOTE — Progress Notes (Signed)
  Chronic Care Management   Outreach Note  03/26/2020 Name: JAMILEX BOHNSACK MRN: [id number] DOB: June 10, 1951  Referred by: Alycia Rossetti, MD Reason for referral : No chief complaint on file.   A second unsuccessful telephone outreach was attempted today. The patient was referred to pharmacist for assistance with care management and care coordination.  Follow Up Plan:   Carley Perdue [employer] Scheduler

## 2020-03-29 ENCOUNTER — Other Ambulatory Visit: Payer: Self-pay | Admitting: Family Medicine

## 2020-04-01 ENCOUNTER — Ambulatory Visit (INDEPENDENT_AMBULATORY_CARE_PROVIDER_SITE_OTHER): Payer: Medicare Other | Admitting: Diagnostic Neuroimaging

## 2020-04-01 ENCOUNTER — Other Ambulatory Visit: Payer: Self-pay

## 2020-04-01 ENCOUNTER — Encounter: Payer: Self-pay | Admitting: Diagnostic Neuroimaging

## 2020-04-01 VITALS — BP 144/80 | HR 71 | Ht 64.5 in | Wt 270.6 lb

## 2020-04-01 DIAGNOSIS — G7000 Myasthenia gravis without (acute) exacerbation: Secondary | ICD-10-CM | POA: Diagnosis not present

## 2020-04-01 MED ORDER — PREDNISONE 10 MG PO TABS: ORAL_TABLET | ORAL | 12 refills | Status: DC

## 2020-04-01 MED ORDER — PYRIDOSTIGMINE BROMIDE 60 MG PO TABS: 60.0000 mg | ORAL_TABLET | Freq: Three times a day (TID) | ORAL | 4 refills | Status: DC

## 2020-04-01 NOTE — Progress Notes (Signed)
[facility]  PATIENT: [name] DOB: [birth year]  REFERRING CLINICIAN: Mincey HISTORY FROM: patient  REASON FOR VISIT: follow up   HISTORICAL  CHIEF COMPLAINT:  Chief Complaint  Patient presents with  . Ocular myasthenia    rm 6, FU to discuss possible meds, "eyes crossing up, closing up more, don't drive as much due to vision being affected"    HISTORY OF PRESENT ILLNESS:   UPDATE (04/01/20, VRP): Since last visit, continues with ptosis and double vision. Symptoms are progressive. Severity is moderate. No alleviating or aggravating factors. Tolerating meds.    UPDATE (01/21/20, VRP): Since last visit, doing well. Symptoms are stable. Severity is mild. No alleviating or aggravating factors. Tolerating pyridostigmine.    UPDATE (09/17/19, VRP): Since last visit, doing worse with left eye ptosis and double vision. Symptoms are progressive. No breathing issues. Some gen weakness. Tolerating pyridostigmine 60mg  three times a day.    UPDATE (02/06/19, VRP): Since last visit, doing about the same. Symptoms are stable. Severity is mild. No alleviating or aggravating factors. Tolerating pyridostigmine. Mild intermittent left ptosis. No SOB, chewing or swallowing issues.   UPDATE (01/02/18, VRP): Since last visit, doing well. Tolerating meds. No alleviating or aggravating factors. No SOB, swallow diff, speech diff. Vision stable.   UPDATE (07/05/17, VRP): Since last visit, doing well. Tolerating pyridostigmine (60mg  three times a day). No alleviating or aggravating factors. Eyes are better. Has some flare of sxs every month (2x per month; few hrs each time). No breathing, swallowing or speaking problems. CT chest reviewed with patient.   PRIOR HPI (03/22/17): 69 year old female with hypertension, hypercholesterolemia, anxiety, here for evaluation of double vision. 6 weeks ago patient had sudden onset of redness in both eyes. She then developed double vision and left  eyelid drooping. She went to eye doctor, then referred to emergency room for MRI testing to rule out stroke. MRI of the brain and orbits were negative for acute process. Patient then had myasthenia gravis antibody panel testing which was notable for borderline positive anti-striation autoantibodies. Patient for here for further evaluation and management. Patient also reports a number of other symptoms including fatigue, swelling in legs, wheezing, headache, numbness, weakness, slurred speech. However these symptoms do not correlate with the recent 6 weeks of double vision and left eyelid weakness. Patient has been using an eye patch due to her double vision. Symptoms have been fairly consistent since onset. However later in her conversation patient states that symptoms fluctuate and are worse in the evening.   REVIEW OF SYSTEMS: Full 14 system review of systems performed and negative with exception of: as per HPI.    ALLERGIES: No Known Allergies  HOME MEDICATIONS: Outpatient Medications Prior to Visit  Medication Sig Dispense Refill  . acetaminophen (TYLENOL) 500 MG tablet Take 1,000 mg by mouth every 6 (six) hours as needed for mild pain.     Marland Kitchen albuterol (VENTOLIN HFA) 108 (90 Base) MCG/ACT inhaler INHALE 2 PUFFS INTO THE LUNGS EVERY 4 HOURS AS NEEDED FOR WHEEZING OR SHORTNESS OF BREATH 8.5 g 2  . allopurinol (ZYLOPRIM) 100 MG tablet TAKE 1 TABLET(100 MG) BY MOUTH DAILY 90 tablet 3  . aspirin EC 81 MG tablet Take 1 tablet (81 mg total) by mouth every evening.    Marland Kitchen atorvastatin (LIPITOR) 40 MG tablet Take 1 tablet (40 mg total) by mouth daily at 6 PM. 90 tablet 1  . clotrimazole (LOTRIMIN) 1 % cream Apply 1 application topically 2 (two) times daily. [place]  g 1  . diphenhydrAMINE (BENADRYL) 25 MG tablet Take 25 mg by mouth daily.     . fluticasone (CUTIVATE) 0.05 % cream APPLY EXTERNALLY TO THE AFFECTED AREA DAILY (Patient taking differently: Apply 1 application topically daily as needed (rash). ) 30  g 3  . furosemide (LASIX) 20 MG tablet TAKE 1 TABLET BY MOUTH DAILY 90 tablet 0  . HYDROcodone-acetaminophen (NORCO) 7.5-325 MG tablet Take 1 tablet by mouth 2 (two) times daily as needed for moderate pain. 60 tablet 0  . losartan (COZAAR) 50 MG tablet TAKE 1 TABLET BY MOUTH DAILY 90 tablet 3  . Magnesium 250 MG TABS Take 1 tablet (250 mg total) by mouth daily. (Patient taking differently: Take 250 mg by mouth daily in the afternoon. Midday) 30 tablet 11  . nystatin (MYCOSTATIN/NYSTOP) powder Apply 1 application topically 3 (three) times daily. 60 g 1  . omeprazole (PRILOSEC) 40 MG capsule TAKE 1 CAPSULE(40 MG) BY MOUTH DAILY (Patient taking differently: Take 40 mg by mouth every evening. ) 90 capsule 3  . OXYGEN Inhale 1 L into the lungs at bedtime. IN ADDITION TO CPAP NIGHTLY    . pyridostigmine (MESTINON) 180 MG CR tablet Take 1 tablet (180 mg total) by mouth at bedtime. 90 tablet 4  . pyridostigmine (MESTINON) 60 MG tablet Take 1 tablet (60 mg total) by mouth 2 (two) times daily. 180 tablet 4  . traZODone (DESYREL) 100 MG tablet TAKE 1 TABLET BY MOUTH AT BEDTIME AS NEEDED FOR SLEEP 30 tablet 2  . traZODone (DESYREL) 50 MG tablet TAKE 1/2 TO 1 TABLET BY MOUTH AT BEDTIME 30 tablet 3   No facility-administered medications prior to visit.    PAST MEDICAL HISTORY: Past Medical History:  Diagnosis Date  . Achilles tendinitis   . Anxiety   . Asthma   . Depression   . GERD (gastroesophageal reflux disease)   . Gout   . H/O myasthenia gravis    left eye  . HTN (hypertension)   . Hyperlipidemia   . Low back pain   . Obesity hypoventilation syndrome ([place])   . Obstructive sleep apnea   . Osteoarthritis     PAST SURGICAL HISTORY: Past Surgical History:  Procedure Laterality Date  . CARDIAC CATHETERIZATION  2006  . CATARACT EXTRACTION W/PHACO Left 09/17/2015   Procedure: CATARACT EXTRACTION PHACO AND INTRAOCULAR LENS PLACEMENT LEFT EYE cde=8.58;  Surgeon: Tonny Branch, MD;  Location: [facility];  Service: Ophthalmology;  Laterality: Left;  . CATARACT EXTRACTION W/PHACO Right 05/15/2017   Procedure: CATARACT EXTRACTION PHACO AND INTRAOCULAR LENS PLACEMENT (IOC);  Surgeon: Tonny Branch, MD;  Location: [facility];  Service: Ophthalmology;  Laterality: Right;  CDE: 6.34  . COLONOSCOPY N/A 12/25/2013   Procedure: COLONOSCOPY;  Surgeon: Rogene Houston, MD;  Location: [facility];  Service: Endoscopy;  Laterality: N/A;  730-rescheduled Ann notified pt  . COLONOSCOPY WITH PROPOFOL N/A 10/04/2019   Procedure: COLONOSCOPY WITH PROPOFOL;  Surgeon: Rogene Houston, MD;  Location: [facility];  Service: Endoscopy;  Laterality: N/A;  815  . CYST EXCISION N/A 09/12/2016   Procedure: EXCISION SEBACEOUS CYST, BACK;  Surgeon: Aviva Signs, MD;  Location: [facility];  Service: General;  Laterality: N/A;  . INTRAOCULAR LENS INSERTION Bilateral 2018   Dr. Tonny Branch   . POLYPECTOMY  10/04/2019   Procedure: POLYPECTOMY;  Surgeon: Rogene Houston, MD;  Location: [facility];  Service: Endoscopy;;  . TUBAL LIGATION      FAMILY HISTORY: Family  History  Problem Relation Age of Onset  . Heart disease Mother   . Thyroid disease Mother   . Heart failure Father   . Lung disease Father   . Diabetes Brother   . Crohn's disease Daughter     SOCIAL HISTORY:  Social History   Socioeconomic History  . Marital status: Widowed    Spouse name: Not on file  . Number of children: 3  . Years of education: 13  . Highest education level: Not on file  Occupational History  . Occupation: disabled    Fish farm manager: UNEMPLOYED  Tobacco Use  . Smoking status: Former Smoker    Packs/day: 2.00    Years: 36.00    Pack years: 72.00    Types: Cigarettes    Start date: 06/25/1969    Quit date: 03/25/2005    Years since quitting: 15.0  . Smokeless tobacco: Never Used  Vaping Use  . Vaping Use: Never used  Substance and Sexual Activity  . Alcohol use: No    Alcohol/week: 0.0 standard drinks  . Drug use: No  . Sexual  activity: Not Currently    Birth control/protection: Post-menopausal  Other Topics Concern  . Not on file  Social History Narrative   Originally from [place]. She has always lived in [place]. 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 No pets currently. No bird, mold, or hot tub exposure.    Lives alone   No caffeine   Social Determinants of Health   Financial Resource Strain:   . Difficulty of Paying Living Expenses: Not on file  Food Insecurity:   . Worried About Charity fundraiser in the Last Year: Not on file  . Ran Out of Food in the Last Year: Not on file  Transportation Needs:   . Lack of Transportation (Medical): Not on file  . Lack of Transportation (Non-Medical): Not on file  Physical Activity:   . Days of Exercise per Week: Not on file  . Minutes of Exercise per Session: Not on file  Stress:   . Feeling of Stress : Not on file  Social Connections:   . Frequency of Communication with Friends and Family: Not on file  . Frequency of Social Gatherings with Friends and Family: Not on file  . Attends Religious Services: Not on file  . Active Member of Clubs or Organizations: Not on file  . Attends Archivist Meetings: Not on file  . Marital Status: Not on file  Intimate Partner Violence:   . Fear of Current or Ex-Partner: Not on file  . Emotionally Abused: Not on file  . Physically Abused: Not on file  . Sexually Abused: Not on file     PHYSICAL EXAM  GENERAL EXAM/CONSTITUTIONAL: Vitals:  Vitals:   04/01/20 1311  BP: (!) 144/80  Pulse: 71  Weight: 270 lb 9.6 oz (122.7 kg)  Height: 5' 4.5" (1.638 m)   Body mass index is 45.73 kg/m. No exam data present  Patient is in no distress; well developed, nourished and groomed; neck is supple  CARDIOVASCULAR:  Examination of carotid arteries is normal; no carotid bruits  Regular rate and rhythm, no murmurs  Examination of peripheral vascular system by observation and palpation  is normal  EYES:  Ophthalmoscopic exam of optic discs and posterior segments is normal; no papilledema or hemorrhages  MUSCULOSKELETAL:  Gait, strength, tone, movements noted in Neurologic exam below  NEUROLOGIC: MENTAL STATUS:  No flowsheet data found.  awake, alert, oriented to person, place and time  recent and remote memory intact  normal attention and concentration  language fluent, comprehension intact, naming intact,   fund of knowledge appropriate  CRANIAL NERVE:   2nd - no papilledema on fundoscopic exam  2nd, 3rd, 4th, 6th - pupils equal and reactive to light, visual fields full to confrontation, extraocular muscles intact, MILD LEFT PTOSIS; NO DOUBLE VISION; END GAZE NYSTAGMUS  5th - facial sensation symmetric  7th - facial strength symmetric  8th - hearing intact  9th - palate elevates symmetrically, uvula midline  11th - shoulder shrug symmetric  12th - tongue protrusion midline  MOTOR:   normal bulk and tone, DIFFUSE 4/5 in the BUE, BLE  SENSORY:   normal and symmetric to light touch  COORDINATION:   finger-nose-finger, fine finger movements normal  REFLEXES:   deep tendon reflexes TRACE and symmetric  GAIT/STATION:   narrow based gait    DIAGNOSTIC DATA (LABS, IMAGING, TESTING) - I reviewed patient records, labs, notes, testing and imaging myself where available.  Lab Results  Component Value Date   WBC 6.0 01/08/2020   HGB 13.2 01/08/2020   HCT 41.2 01/08/2020   MCV 92.0 01/08/2020   PLT 234 01/08/2020      Component Value Date/Time   NA 142 01/08/2020 1225   K 5.3 01/08/2020 1225   CL 107 01/08/2020 1225   CO2 30 01/08/2020 1225   GLUCOSE 105 (H) 01/08/2020 1225   BUN 9 01/08/2020 1225   CREATININE 1.07 (H) 01/08/2020 1225   CALCIUM 8.8 01/08/2020 1225   PROT 6.9 01/08/2020 1225   ALBUMIN 3.5 02/24/2017 0938   AST 14 01/08/2020 1225   ALT 10 01/08/2020 1225   ALKPHOS 71 02/24/2017 0938   BILITOT 0.5 01/08/2020  1225   GFRNONAA 46 (L) 02/24/2017 0938   GFRAA 54 (L) 02/24/2017 0938   Lab Results  Component Value Date   CHOL 140 01/08/2020   HDL 56 01/08/2020   LDLCALC 61 01/08/2020   LDLDIRECT 157 (H) 02/25/2013   TRIG 144 01/08/2020   CHOLHDL 2.5 01/08/2020   Lab Results  Component Value Date   HGBA1C 5.7 (H) 09/18/2019   Lab Results  Component Value Date   VITAMINB12 450 01/25/2012   Lab Results  Component Value Date   TSH 1.766 01/25/2012    02/24/17 MRI brain / orbits [I reviewed images myself and agree with interpretation. -VRP]  - No explanation for symptoms. No infarct, compressive lesion, or neuritis findings.  03/01/17 labs - anti AchR binding, blocking, modulation - normal - anti striational ab - 1:40 (h)  01/13/20      AChR Binding Ab, Serum 0.00 - 0.24 nmol/L 0.05    01/21/20      MuSK Antibodies U/mL <1.0    04/05/17 CT chest - No evidence of residual thymic tissue or or thymoma. - No acute abnormality noted.    ASSESSMENT AND PLAN  69 y.o. year old female here with new onset of double vision left eyelid ptosis, with positive ice pack test in office, and slightly positive anti-striation all antibody testing. Findings consistent with ocular myasthenia gravis.   Dx:  1. Ocular myasthenia (HCC)     PLAN:  MYASTHENIA GRAVIS (ocular)  - CHANGE pyridostigmine to 60mg  three times a day   - STOP pyridostigmine 180mg  extended release at bedtime  - START prednisone 20mg  daily; increase by 10mg  every 2 weeks until 60mg  daily; caution with BP and weight control; continue  prilosec; take vitamin D + calcium supplement  - cautioned patient if she develops breathing, chewing, swallowing sxs; patient to contact us right away or go to ER if bulbar or generalized symptoms develop  Meds ordered this encounter  Medications  . pyridostigmine (MESTINON) 60 MG tablet    Sig: Take 1 tablet (60 mg total) by mouth 3 (three) times daily.    Dispense:  180 tablet    Refill:   4  . predniSONE (DELTASONE) 10 MG tablet    Sig: Start 20mg  daily x 2 weeks; then 30mg  daily x 2 weeks; then 40mg  daily x 2 weeks; then 50mg  daily x 2 weeks; then 60mg  daily    Dispense:  180 tablet    Refill:  12   Return in about 6 months (around 09/29/2020).    Penni Bombard, MD 0/11/1831, 5:82 PM Certified in Neurology, Neurophysiology and Neuroimaging  [facility] [address], [place], [place] [address] [phone]

## 2020-04-01 NOTE — Patient Instructions (Signed)
  MYASTHENIA GRAVIS (ocular)  - CHANGE pyridostigmine to 60mg  three times a day   - STOP pyridostigmine 180mg  extended release at bedtime  - START prednisone 20mg  daily; increase by 10mg  every 2 weeks until 60mg  daily; caution with BP and weight control; continue prilosec; take vitamin D + calcium supplement

## 2020-04-02 ENCOUNTER — Telehealth: Payer: Self-pay | Admitting: Family Medicine

## 2020-04-02 NOTE — Progress Notes (Signed)
  Chronic Care Management   Outreach Note  04/02/2020 Name: [name] MRN: [id number] DOB: 12-29-1950  Referred by: Alycia Rossetti, MD Reason for referral : No chief complaint on file.   Third unsuccessful telephone outreach was attempted today. The patient was referred to the pharmacist for assistance with care management and care coordination.   Follow Up Plan:   Carley Perdue [employer] Scheduler

## 2020-04-06 ENCOUNTER — Other Ambulatory Visit: Payer: Self-pay | Admitting: Family Medicine

## 2020-04-06 MED ORDER — HYDROCODONE-ACETAMINOPHEN 7.5-325 MG PO TABS: 1.0000 | ORAL_TABLET | Freq: Two times a day (BID) | ORAL | 0 refills | Status: DC | PRN

## 2020-04-06 NOTE — Telephone Encounter (Signed)
Ok to refill??  Last office visit 01/08/2020.  Last refill 03/06/2020.

## 2020-04-06 NOTE — Telephone Encounter (Signed)
CB# [phone] Refill Hydrocodone

## 2020-04-22 DIAGNOSIS — J449 Chronic obstructive pulmonary disease, unspecified: Secondary | ICD-10-CM | POA: Diagnosis not present

## 2020-05-06 ENCOUNTER — Other Ambulatory Visit: Payer: Self-pay | Admitting: Family Medicine

## 2020-05-06 DIAGNOSIS — M8949 Other hypertrophic osteoarthropathy, multiple sites: Secondary | ICD-10-CM

## 2020-05-06 DIAGNOSIS — M159 Polyosteoarthritis, unspecified: Secondary | ICD-10-CM

## 2020-05-06 MED ORDER — HYDROCODONE-ACETAMINOPHEN 7.5-325 MG PO TABS: 1.0000 | ORAL_TABLET | Freq: Two times a day (BID) | ORAL | 0 refills | Status: DC | PRN

## 2020-05-06 NOTE — Telephone Encounter (Signed)
Last office visit - 04/01/20 Last refilled on -  04/06/20

## 2020-05-06 NOTE — Telephone Encounter (Signed)
Patient calling to request her hydrocodone called into [facility] on [address].  CB# [phone]

## 2020-05-12 ENCOUNTER — Other Ambulatory Visit: Payer: Self-pay

## 2020-05-12 ENCOUNTER — Ambulatory Visit (INDEPENDENT_AMBULATORY_CARE_PROVIDER_SITE_OTHER): Payer: Medicare Other | Admitting: Family Medicine

## 2020-05-12 ENCOUNTER — Encounter: Payer: Self-pay | Admitting: Family Medicine

## 2020-05-12 VITALS — BP 134/80 | HR 74 | Temp 97.9°F | Resp 14 | Ht 64.5 in | Wt 274.0 lb

## 2020-05-12 DIAGNOSIS — R7303 Prediabetes: Secondary | ICD-10-CM

## 2020-05-12 DIAGNOSIS — M8949 Other hypertrophic osteoarthropathy, multiple sites: Secondary | ICD-10-CM

## 2020-05-12 DIAGNOSIS — Z0001 Encounter for general adult medical examination with abnormal findings: Secondary | ICD-10-CM

## 2020-05-12 DIAGNOSIS — G4733 Obstructive sleep apnea (adult) (pediatric): Secondary | ICD-10-CM | POA: Diagnosis not present

## 2020-05-12 DIAGNOSIS — I10 Essential (primary) hypertension: Secondary | ICD-10-CM

## 2020-05-12 DIAGNOSIS — T380X5A Adverse effect of glucocorticoids and synthetic analogues, initial encounter: Secondary | ICD-10-CM | POA: Insufficient documentation

## 2020-05-12 DIAGNOSIS — Z0000 Encounter for general adult medical examination without abnormal findings: Secondary | ICD-10-CM

## 2020-05-12 DIAGNOSIS — E099 Drug or chemical induced diabetes mellitus without complications: Secondary | ICD-10-CM | POA: Insufficient documentation

## 2020-05-12 DIAGNOSIS — Z23 Encounter for immunization: Secondary | ICD-10-CM | POA: Diagnosis not present

## 2020-05-12 DIAGNOSIS — G7000 Myasthenia gravis without (acute) exacerbation: Secondary | ICD-10-CM

## 2020-05-12 DIAGNOSIS — E669 Obesity, unspecified: Secondary | ICD-10-CM

## 2020-05-12 DIAGNOSIS — M159 Polyosteoarthritis, unspecified: Secondary | ICD-10-CM

## 2020-05-12 NOTE — Progress Notes (Signed)
Subjective:   Patient presents for Medicare Annual/Subsequent preventive examination.   Pt here for wellness visit   She was seen Neurology - now on Prednisone 60mg  onve a day due to Ocular Myasthenia   Weight up 2lbs    Chronic insomnia-last visit Trazodone increaed 100mg    Hyperlipidemia- LDL at goal 61 in June   No new concerns today   meds reviewed  Review Past Medical/Family/Social: Per EMR    Risk Factors  Current exercise habits: occ walking  Dietary issues discussed:   Cardiac risk factors: Obesity (BMI >= 30 kg/m2). HTN  Depression Screen PHQ  9 score 2  (Note: if answer to either of the following is "Yes", a more complete depression screening is indicated)  Over the past two weeks, have you felt down, depressed or hopeless? No Over the past two weeks, have you felt little interest or pleasure in doing things? Yes Have you lost interest or pleasure in daily life? No Do you often feel hopeless? No Do you cry easily over simple problems? No   Activities of Daily Living  In your present state of health, do you have any difficulty performing the following activities?:  Driving? No  Managing money? No  Feeding yourself? No  Getting from bed to chair? No  Climbing a flight of stairs? No  Preparing food and eating?: No  Bathing or showering? No  Getting dressed: No  Getting to the toilet? No  Using the toilet:No  Moving around from place to place: No  In the past year have you fallen or had a near fall?:No  Are you sexually active? No  Do you have more than one partner? No   Hearing Difficulties: No  Do you often ask people to speak up or repeat themselves? No  Do you experience ringing or noises in your ears? No Do you have difficulty understanding soft or whispered voices? No  Do you feel that you have a problem with memory? No Do you often misplace items? No  Do you feel safe at home? Yes  Cognitive Testing  Alert? Yes Normal Appearance?Yes  Oriented to  person? Yes Place? Yes  Time? Yes  Recall of three objects? Yes  Can perform simple calculations? Yes  Displays appropriate judgment?Yes  Can read the correct time from a watch face?Yes   List the Names of Other Physician/Practitioners you currently use:  Neurology  GYN- Dr. Dellis Filbert GI- Dr. Laural Golden  Podiatry Pulmonary   Screening Tests / Date Colonoscopy           March 2021           Mammogram UTD March 2021  Influenza Vaccine UTD PNA UTD  Tetanus/tdap COVID-19 Vaccine UTD  ROS:  GEN- denies fatigue, fever, weight loss,weakness, recent illness HEENT- denies eye drainage, change in vision, nasal discharge, CVS- denies chest pain, palpitations RESP- denies SOB, cough, wheeze ABD- denies N/V, change in stools, abd pain GU- denies dysuria, hematuria, dribbling, incontinence MSK- + joint pain, muscle aches, injury Neuro- denies headache, dizziness, syncope, seizure activity  PHYSICAL: VITALS REVIEWED  GEN- NAD, alert and oriented x3 HEENT- PERRL, EOMI, non injected sclera, pink conjunctiva, MMM, oropharynx clear Neck- Supple, no thryomegaly CVS- RRR, no murmur RESP-CTAB ABD-NABS,SOFT, NT,ND  EXT- No edema Pulses- Radial, DP- 2+  Assessment:    Annual wellness medicare exam   Plan:    During the course of the visit the patient was educated and counseled about appropriate screening and preventive services including:  Immunizations- Flu shot given, pt has had COVID-19 vaccine, will obtain info from pharmacy  Prevention UTD including colonoscopy  HTN controllled  Myasthenia Ocular- high dose prednisone, will monitor weight and blood pressure  Borderline DM- check A1C, will need to monitor sugar with labs due to prolonged steroid use   Given handout on advanced directives    OSA on CPAP Therapy, followed by pulmonary   Chronic pain- continue pain meds   FULL CODE          .  Diet review for nutrition referral? Yes ____ Not Indicated __x__  Patient  Instructions (the written plan) was given to the patient.  Medicare Attestation  I have personally reviewed:  The patient's medical and social history  Their use of alcohol, tobacco or illicit drugs  Their current medications and supplements  The patient's functional ability including ADLs,fall risks, home safety risks, cognitive, and hearing and visual impairment  Diet and physical activities  Evidence for depression or mood disorders  The patient's weight, height, BMI, and visual acuity have been recorded in the chart. I have made referrals, counseling, and provided education to the patient based on review of the above and I have provided the patient with a written personalized care plan for preventive services.

## 2020-05-12 NOTE — Patient Instructions (Signed)
F/U 4 months  

## 2020-05-13 LAB — CBC WITH DIFFERENTIAL/PLATELET
Absolute Monocytes: 657 cells/uL (ref 200–950)
Basophils Absolute: 25 cells/uL (ref 0–200)
Basophils Relative: 0.2 %
Eosinophils Absolute: 25 cells/uL (ref 15–500)
Eosinophils Relative: 0.2 %
HCT: 43.9 % (ref 35.0–45.0)
Hemoglobin: 14.3 g/dL (ref 11.7–15.5)
Lymphs Abs: 1153 cells/uL (ref 850–3900)
MCH: 29.6 pg (ref 27.0–33.0)
MCHC: 32.6 g/dL (ref 32.0–36.0)
MCV: 90.9 fL (ref 80.0–100.0)
MPV: 10 fL (ref 7.5–12.5)
Monocytes Relative: 5.3 %
Neutro Abs: 10540 cells/uL — ABNORMAL HIGH (ref 1500–7800)
Neutrophils Relative %: 85 %
Platelets: 245 10*3/uL (ref 140–400)
RBC: 4.83 10*6/uL (ref 3.80–5.10)
RDW: 12.6 % (ref 11.0–15.0)
Total Lymphocyte: 9.3 %
WBC: 12.4 10*3/uL — ABNORMAL HIGH (ref 3.8–10.8)

## 2020-05-13 LAB — HEMOGLOBIN A1C
Hgb A1c MFr Bld: 6 % of total Hgb — ABNORMAL HIGH (ref <5.7)
Mean Plasma Glucose: 126 (calc)
eAG (mmol/L): 7 (calc)

## 2020-05-13 LAB — COMPREHENSIVE METABOLIC PANEL
AG Ratio: 1.3 (calc) (ref 1.0–2.5)
ALT: 21 U/L (ref 6–29)
AST: 14 U/L (ref 10–35)
Albumin: 3.7 g/dL (ref 3.6–5.1)
Alkaline phosphatase (APISO): 76 U/L (ref 37–153)
BUN/Creatinine Ratio: 11 (calc) (ref 6–22)
BUN: 13 mg/dL (ref 7–25)
CO2: 36 mmol/L — ABNORMAL HIGH (ref 20–32)
Calcium: 9.5 mg/dL (ref 8.6–10.4)
Chloride: 98 mmol/L (ref 98–110)
Creat: 1.23 mg/dL — ABNORMAL HIGH (ref 0.60–0.93)
Globulin: 2.8 g/dL (calc) (ref 1.9–3.7)
Glucose, Bld: 85 mg/dL (ref 65–99)
Potassium: 5.1 mmol/L (ref 3.5–5.3)
Sodium: 144 mmol/L (ref 135–146)
Total Bilirubin: 0.7 mg/dL (ref 0.2–1.2)
Total Protein: 6.5 g/dL (ref 6.1–8.1)

## 2020-05-22 DIAGNOSIS — J449 Chronic obstructive pulmonary disease, unspecified: Secondary | ICD-10-CM | POA: Diagnosis not present

## 2020-05-25 ENCOUNTER — Telehealth: Payer: Self-pay | Admitting: Family Medicine

## 2020-05-25 MED ORDER — LANCETS MISC: 11 refills | Status: DC

## 2020-05-25 MED ORDER — BLOOD GLUCOSE TEST VI STRP: ORAL_STRIP | 11 refills | Status: DC

## 2020-05-25 MED ORDER — BLOOD GLUCOSE SYSTEM PAK KIT: PACK | 1 refills | Status: DC

## 2020-05-25 NOTE — Telephone Encounter (Signed)
Pt need a glaucous meter along with the strips order  to be fax to the pharmacy so she can start checking her blood sugar      [id number] - [place], [place] - [address] AT [address]

## 2020-05-25 NOTE — Telephone Encounter (Signed)
Prescription sent to pharmacy.

## 2020-06-05 ENCOUNTER — Other Ambulatory Visit: Payer: Self-pay | Admitting: Family Medicine

## 2020-06-05 DIAGNOSIS — M159 Polyosteoarthritis, unspecified: Secondary | ICD-10-CM

## 2020-06-05 NOTE — Telephone Encounter (Signed)
Ok to refill??  Last office visit 05/12/2020.  Last refill 05/06/2020.

## 2020-06-05 NOTE — Telephone Encounter (Signed)
Refill- Hydrocodone

## 2020-06-08 ENCOUNTER — Telehealth: Payer: Self-pay | Admitting: Family Medicine

## 2020-06-08 MED ORDER — HYDROCODONE-ACETAMINOPHEN 7.5-325 MG PO TABS: 1.0000 | ORAL_TABLET | Freq: Two times a day (BID) | ORAL | 0 refills | Status: DC | PRN

## 2020-06-08 NOTE — Telephone Encounter (Signed)
Call placed to patient to inquire.  No answer, no VM.

## 2020-06-08 NOTE — Telephone Encounter (Signed)
Patient would like to know what size compression hose she needs. She states that she had a prescription for them in the past and her daughter ordered them online.  CB# [phone]

## 2020-06-09 NOTE — Telephone Encounter (Signed)
Multiple calls placed to patient with no answer and no return call.   Message to be closed.  

## 2020-06-09 NOTE — Telephone Encounter (Signed)
Call placed to patient. No answer. No VM.  

## 2020-06-21 ENCOUNTER — Other Ambulatory Visit: Payer: Self-pay | Admitting: Family Medicine

## 2020-06-22 ENCOUNTER — Telehealth: Payer: Self-pay | Admitting: Diagnostic Neuroimaging

## 2020-06-22 DIAGNOSIS — J449 Chronic obstructive pulmonary disease, unspecified: Secondary | ICD-10-CM | POA: Diagnosis not present

## 2020-06-22 NOTE — Telephone Encounter (Signed)
Pt asking how many predniSONE (DELTASONE) 10 MG tablet she is supposed to take a day.  Please call

## 2020-06-22 NOTE — Telephone Encounter (Signed)
Called patient but no answer and VMB not set up. Will try later. She should now be on 60 mg daily per Dr Gladstone Lighter taper schedule given to her in Sept.

## 2020-06-22 NOTE — Telephone Encounter (Addendum)
[facility], spoke with [name] who stated that the refill was sent to her insurance too soon. The will call insurance and get the refill ready asap. Called patient and advised her. Patient verbalized understanding, appreciation.

## 2020-06-22 NOTE — Telephone Encounter (Signed)
Patient called back, stated [facility] told her she'll run out of prednisone before they can refill it due to way the Rx is written. I advised I'll call pharmacy, and let her know. Patient verbalized understanding, appreciation.

## 2020-06-29 ENCOUNTER — Other Ambulatory Visit: Payer: Self-pay | Admitting: Family Medicine

## 2020-07-05 ENCOUNTER — Other Ambulatory Visit: Payer: Self-pay | Admitting: Family Medicine

## 2020-07-06 ENCOUNTER — Other Ambulatory Visit: Payer: Self-pay

## 2020-07-06 DIAGNOSIS — M159 Polyosteoarthritis, unspecified: Secondary | ICD-10-CM

## 2020-07-06 MED ORDER — HYDROCODONE-ACETAMINOPHEN 7.5-325 MG PO TABS: 1.0000 | ORAL_TABLET | Freq: Two times a day (BID) | ORAL | 0 refills | Status: DC | PRN

## 2020-07-06 NOTE — Addendum Note (Signed)
Addended by: Sheral Flow on: 07/06/2020 11:16 AM   Modules accepted: Orders

## 2020-07-06 NOTE — Telephone Encounter (Signed)
Pt needs pain medication refilled, and a referral to the foot dr

## 2020-07-06 NOTE — Telephone Encounter (Signed)
Pt has no voicemail.

## 2020-07-06 NOTE — Telephone Encounter (Signed)
Ok to refill Hydrocodone/ APAP??  Last office visit/ refill 06/08/2020.  Attempted to call patient in regards to referral. No answer, no VM.

## 2020-07-11 ENCOUNTER — Encounter (HOSPITAL_COMMUNITY): Payer: Self-pay | Admitting: Emergency Medicine

## 2020-07-11 ENCOUNTER — Emergency Department (HOSPITAL_COMMUNITY)
Admission: EM | Admit: 2020-07-11 | Discharge: 2020-07-11 | Disposition: A | Discharge status: Home / Self Care | Payer: Medicare Other | Attending: Emergency Medicine | Admitting: Emergency Medicine

## 2020-07-11 ENCOUNTER — Other Ambulatory Visit: Payer: Self-pay

## 2020-07-11 ENCOUNTER — Emergency Department (HOSPITAL_COMMUNITY): Payer: Medicare Other

## 2020-07-11 DIAGNOSIS — Z7984 Long term (current) use of oral hypoglycemic drugs: Secondary | ICD-10-CM | POA: Insufficient documentation

## 2020-07-11 DIAGNOSIS — I10 Essential (primary) hypertension: Secondary | ICD-10-CM | POA: Insufficient documentation

## 2020-07-11 DIAGNOSIS — J45909 Unspecified asthma, uncomplicated: Secondary | ICD-10-CM | POA: Diagnosis not present

## 2020-07-11 DIAGNOSIS — R609 Edema, unspecified: Secondary | ICD-10-CM | POA: Diagnosis not present

## 2020-07-11 DIAGNOSIS — Z7982 Long term (current) use of aspirin: Secondary | ICD-10-CM | POA: Diagnosis not present

## 2020-07-11 DIAGNOSIS — Z79899 Other long term (current) drug therapy: Secondary | ICD-10-CM | POA: Insufficient documentation

## 2020-07-11 DIAGNOSIS — R739 Hyperglycemia, unspecified: Secondary | ICD-10-CM | POA: Diagnosis not present

## 2020-07-11 DIAGNOSIS — Z87891 Personal history of nicotine dependence: Secondary | ICD-10-CM | POA: Diagnosis not present

## 2020-07-11 DIAGNOSIS — R600 Localized edema: Secondary | ICD-10-CM | POA: Diagnosis not present

## 2020-07-11 DIAGNOSIS — M19071 Primary osteoarthritis, right ankle and foot: Secondary | ICD-10-CM | POA: Diagnosis not present

## 2020-07-11 DIAGNOSIS — M7989 Other specified soft tissue disorders: Secondary | ICD-10-CM | POA: Diagnosis not present

## 2020-07-11 DIAGNOSIS — E1165 Type 2 diabetes mellitus with hyperglycemia: Secondary | ICD-10-CM | POA: Diagnosis not present

## 2020-07-11 DIAGNOSIS — I517 Cardiomegaly: Secondary | ICD-10-CM | POA: Diagnosis not present

## 2020-07-11 LAB — CBC WITH DIFFERENTIAL/PLATELET
Abs Immature Granulocytes: 0.12 10*3/uL — ABNORMAL HIGH (ref 0.00–0.07)
Basophils Absolute: 0 10*3/uL (ref 0.0–0.1)
Basophils Relative: 0 %
Eosinophils Absolute: 0 10*3/uL (ref 0.0–0.5)
Eosinophils Relative: 0 %
HCT: 40.9 % (ref 36.0–46.0)
Hemoglobin: 12.6 g/dL (ref 12.0–15.0)
Immature Granulocytes: 1 %
Lymphocytes Relative: 7 %
Lymphs Abs: 0.6 10*3/uL — ABNORMAL LOW (ref 0.7–4.0)
MCH: 29 pg (ref 26.0–34.0)
MCHC: 30.8 g/dL (ref 30.0–36.0)
MCV: 94.2 fL (ref 80.0–100.0)
Monocytes Absolute: 0.5 10*3/uL (ref 0.1–1.0)
Monocytes Relative: 5 %
Neutro Abs: 7.6 10*3/uL (ref 1.7–7.7)
Neutrophils Relative %: 87 %
Platelets: 188 10*3/uL (ref 150–400)
RBC: 4.34 MIL/uL (ref 3.87–5.11)
RDW: 14.7 % (ref 11.5–15.5)
WBC: 8.9 10*3/uL (ref 4.0–10.5)
nRBC: 0.2 % (ref 0.0–0.2)

## 2020-07-11 LAB — COMPREHENSIVE METABOLIC PANEL
ALT: 24 U/L (ref 0–44)
AST: 15 U/L (ref 15–41)
Albumin: 3 g/dL — ABNORMAL LOW (ref 3.5–5.0)
Alkaline Phosphatase: 104 U/L (ref 38–126)
Anion gap: 9 (ref 5–15)
BUN: 22 mg/dL (ref 8–23)
CO2: 37 mmol/L — ABNORMAL HIGH (ref 22–32)
Calcium: 8.8 mg/dL — ABNORMAL LOW (ref 8.9–10.3)
Chloride: 90 mmol/L — ABNORMAL LOW (ref 98–111)
Creatinine, Ser: 1.21 mg/dL — ABNORMAL HIGH (ref 0.44–1.00)
GFR, Estimated: 48 mL/min — ABNORMAL LOW (ref >60)
Glucose, Bld: 534 mg/dL (ref 70–99)
Potassium: 4.8 mmol/L (ref 3.5–5.1)
Sodium: 136 mmol/L (ref 135–145)
Total Bilirubin: 0.9 mg/dL (ref 0.3–1.2)
Total Protein: 6.4 g/dL — ABNORMAL LOW (ref 6.5–8.1)

## 2020-07-11 LAB — CBG MONITORING, ED
Glucose-Capillary: 449 mg/dL — ABNORMAL HIGH (ref 70–99)
Glucose-Capillary: 410 mg/dL — ABNORMAL HIGH (ref 70–99)
Glucose-Capillary: 315 mg/dL — ABNORMAL HIGH (ref 70–99)
Glucose-Capillary: 252 mg/dL — ABNORMAL HIGH (ref 70–99)

## 2020-07-11 MED ORDER — INSULIN ASPART 100 UNIT/ML ~~LOC~~ SOLN
8.0000 [IU] | Freq: Once | SUBCUTANEOUS | Status: AC
  Administered 2020-07-11: 10:00:00 8 [IU] via INTRAVENOUS
  Filled 2020-07-11: qty 1

## 2020-07-11 MED ORDER — METFORMIN HCL 500 MG PO TABS: 500.0000 mg | ORAL_TABLET | Freq: Two times a day (BID) | ORAL | 0 refills | Status: DC

## 2020-07-11 MED ORDER — FUROSEMIDE 20 MG PO TABS: 40.0000 mg | ORAL_TABLET | Freq: Every day | ORAL | 0 refills | Status: DC

## 2020-07-11 MED ORDER — FUROSEMIDE 10 MG/ML IJ SOLN
20.0000 mg | Freq: Once | INTRAMUSCULAR | Status: AC
  Administered 2020-07-11: 15:00:00 20 mg via INTRAVENOUS
  Filled 2020-07-11: qty 2

## 2020-07-11 MED ORDER — INSULIN ASPART 100 UNIT/ML ~~LOC~~ SOLN
8.0000 [IU] | Freq: Once | SUBCUTANEOUS | Status: AC
  Administered 2020-07-11: 13:00:00 8 [IU] via INTRAVENOUS
  Filled 2020-07-11: qty 1

## 2020-07-11 MED ORDER — INSULIN ASPART 100 UNIT/ML ~~LOC~~ SOLN
6.0000 [IU] | Freq: Once | SUBCUTANEOUS | Status: AC
  Administered 2020-07-11: 15:00:00 6 [IU] via INTRAVENOUS
  Filled 2020-07-11: qty 1

## 2020-07-11 MED ORDER — POTASSIUM CHLORIDE ER 10 MEQ PO TBCR: 10.0000 meq | EXTENDED_RELEASE_TABLET | Freq: Every day | ORAL | 0 refills | Status: DC

## 2020-07-11 NOTE — ED Notes (Signed)
Dr. Alvino Chapel made aware of repeat CBG of 252.

## 2020-07-11 NOTE — ED Notes (Addendum)

## 2020-07-11 NOTE — ED Notes (Signed)
CRITICAL VALUE ALERT  Critical Value:  Glucose 534  Date & Time Notied:  07/11/20 @ 4883  Provider Notified: Dr Pickering/ Domenica Reamer, RN  Orders Received/Actions taken: see new orders.

## 2020-07-11 NOTE — ED Provider Notes (Signed)
[facility] EMERGENCY DEPARTMENT Provider Note   CSN: [id number] Arrival date & time: 07/11/20  7341     History Chief Complaint  Patient presents with  . Foot Swelling    [name] is a 69 y.o. female.  HPI Patient presents with pain and swelling both feet.  Has been there for around a week.  States she is sometimes has some mild swelling her feet but never this severe.  States it is on both sides.  There is bruising in the right foot and ankle.  States this started a few days ago.  States she did have a fall around 2 weeks ago but did not have any bruising at a time.  States she is having some mild difficulty breathing.  She is on prednisone for optic myasthenia.  No fevers.  No cough.  States she is not diabetic but has been told to watch out for it with steroids.  States she does urinate a little more frequently but only gets up at night around once.    Past Medical History:  Diagnosis Date  . Achilles tendinitis   . Anxiety   . Asthma   . Depression   . GERD (gastroesophageal reflux disease)   . Gout   . H/O myasthenia gravis    left eye  . HTN (hypertension)   . Hyperlipidemia   . Low back pain   . Obesity hypoventilation syndrome ([place])   . Obstructive sleep apnea   . Osteoarthritis     Patient Active Problem List   Diagnosis Date Noted  . Pre-diabetes 05/12/2020  . Chronic insomnia 01/08/2020  . Ocular myasthenia gravis ([place]) 09/18/2019  . Peripheral edema 01/23/2019  . Asthma 05/14/2015  . Heel spur 02/24/2015  . Depression 02/24/2015  . Post-menopausal bleeding 06/02/2014  . OA (osteoarthritis) of knee 04/09/2014  . Epidermal cyst 10/01/2013  . Class 3 obesity 02/25/2013  . Back pain 03/04/2012  . Gout 01/25/2012  . Edema 01/28/2011  . OBESITY HYPOVENTILATION SYNDROME 06/16/2008  . OBSTRUCTIVE SLEEP APNEA 05/08/2008  . Hyperlipidemia 08/28/2006  . Essential hypertension 08/28/2006  . GERD 08/28/2006  . Osteoarthritis 08/28/2006    Past  Surgical History:  Procedure Laterality Date  . CARDIAC CATHETERIZATION  2006  . CATARACT EXTRACTION W/PHACO Left 09/17/2015   Procedure: CATARACT EXTRACTION PHACO AND INTRAOCULAR LENS PLACEMENT LEFT EYE cde=8.58;  Surgeon: Tonny Branch, MD;  Location: [facility] OR[facility];  Service: Ophthalmology;  Laterality: Left;  . CATARACT EXTRACTION W/PHACO Right 05/15/2017   Procedure: CATARACT EXTRACTION PHACO AND INTRAOCULAR LENS PLACEMENT (IOC);  Surgeon: Tonny Branch, MD;  Location: [facility] OR[facility];  Service: Ophthalmology;  Laterality: Right;  CDE: 6.34  . COLONOSCOPY N/A 12/25/2013   Procedure: COLONOSCOPY;  Surgeon: Rogene Houston, MD;  Location: [facility] ENDO SUITE;  Service: Endoscopy;  Laterality: N/A;  730-rescheduled Ann notified pt  . COLONOSCOPY WITH PROPOFOL N/A 10/04/2019   Procedure: COLONOSCOPY WITH PROPOFOL;  Surgeon: Rogene Houston, MD;  Location: [facility] SUITE;  Service: Endoscopy;  Laterality: N/A;  815  . CYST EXCISION N/A 09/12/2016   Procedure: EXCISION SEBACEOUS CYST, BACK;  Surgeon: Aviva Signs, MD;  Location: [facility];  Service: General;  Laterality: N/A;  . INTRAOCULAR LENS INSERTION Bilateral 2018   Dr. Tonny Branch   . POLYPECTOMY  10/04/2019   Procedure: POLYPECTOMY;  Surgeon: Rogene Houston, MD;  Location: [facility];  Service: Endoscopy;;  . TUBAL LIGATION       OB History    Gravida  3   Para  3   Term  3   Preterm      AB      Living  3     SAB      IAB      Ectopic      Multiple      Live Births              Family History  Problem Relation Age of Onset  . Heart disease Mother   . Thyroid disease Mother   . Heart failure Father   . Lung disease Father   . Diabetes Brother   . Crohn's disease Daughter     Social History   Tobacco Use  . Smoking status: Former Smoker    Packs/day: 2.00    Years: 36.00    Pack years: 72.00    Types: Cigarettes    Start date: 06/25/1969    Quit date: 03/25/2005    Years since quitting: 15.3  . Smokeless tobacco: Never Used   Vaping Use  . Vaping Use: Never used  Substance Use Topics  . Alcohol use: No    Alcohol/week: 0.0 standard drinks  . Drug use: No    Home Medications Prior to Admission medications   Medication Sig Start Date End Date Taking? Authorizing Provider  albuterol (VENTOLIN HFA) 108 (90 Base) MCG/ACT inhaler INHALE 2 PUFFS INTO THE LUNGS EVERY 4 HOURS AS NEEDED FOR WHEEZING OR SHORTNESS OF BREATH Patient taking differently: Inhale 2 puffs into the lungs every 4 (four) hours as needed for wheezing or shortness of breath. 01/23/20  Yes [place], Modena Nunnery, MD  allopurinol (ZYLOPRIM) 100 MG tablet TAKE 1 TABLET(100 MG) BY MOUTH DAILY Patient taking differently: Take 100 mg by mouth daily. 02/28/20  Yes [place], Modena Nunnery, MD  aspirin EC 81 MG tablet Take 1 tablet (81 mg total) by mouth every evening. 10/05/19  Yes Rehman, Mechele Dawley, MD  atorvastatin (LIPITOR) 40 MG tablet Take 1 tablet (40 mg total) by mouth daily at 6 PM. 02/18/20  Yes [place], Modena Nunnery, MD  clotrimazole (LOTRIMIN) 1 % cream Apply 1 application topically 2 (two) times daily. 01/08/20  Yes [place], Modena Nunnery, MD  fluticasone (CUTIVATE) 0.05 % cream APPLY EXTERNALLY TO THE AFFECTED AREA DAILY Patient taking differently: Apply 1 application topically daily as needed (rash). 08/13/18  Yes [place], Modena Nunnery, MD  HYDROcodone-acetaminophen (NORCO) 7.5-325 MG tablet Take 1 tablet by mouth 2 (two) times daily as needed for moderate pain. 07/06/20  Yes [place], Modena Nunnery, MD  losartan (COZAAR) 50 MG tablet TAKE 1 TABLET BY MOUTH DAILY 11/21/19  Yes [place], Modena Nunnery, MD  Magnesium 250 MG TABS Take 1 tablet (250 mg total) by mouth daily. Patient taking differently: Take 250 mg by mouth daily in the afternoon. Midday 08/16/18  Yes [place], Modena Nunnery, MD  omeprazole (PRILOSEC) 40 MG capsule TAKE 1 CAPSULE(40 MG) BY MOUTH DAILY Patient taking differently: Take 40 mg by mouth daily. 06/29/20  Yes [place], Modena Nunnery, MD  OXYGEN Inhale 1 L into the lungs at  bedtime. IN ADDITION TO CPAP NIGHTLY   Yes [provider]  predniSONE (DELTASONE) 10 MG tablet Start 49m daily x 2 weeks; then 338mdaily x 2 weeks; then 4060maily x 2 weeks; then 59m75mily x 2 weeks; then 60mg53mly Patient taking differently: Start 20mg 51my x 2 weeks; then 30mg d39m x 2 weeks; then 40mg da39mx 2 weeks; then 59mg dai21m 2 weeks;  then 79m daily- currently on 551mdaily 04/01/20  Yes [place], ViEarlean PolkaMD  pyridostigmine (MESTINON) 60 MG tablet Take 1 tablet (60 mg total) by mouth 3 (three) times daily. 04/01/20  Yes Penumalli, ViEarlean PolkaMD  traZODone (DESYREL) 100 MG tablet TAKE 1 TABLET BY MOUTH AT BEDTIME AS NEEDED FOR SLEEP 07/07/20  Yes [place], KaModena NunneryMD  acetaminophen (TYLENOL) 500 MG tablet Take 1,000 mg by mouth every 6 (six) hours as needed for mild pain.     [provider]  Blood Glucose Monitoring Suppl (BLOOD GLUCOSE SYSTEM PAK) KIT Use as directed to monitor FSBS 3x weekly. Dx: R73.09. 05/25/20   DuAlycia RossettiMD  diphenhydrAMINE (BENADRYL) 25 MG tablet Take 25 mg by mouth daily.  Patient not taking: Reported on 07/11/2020    [provider]  furosemide (LASIX) 20 MG tablet Take 2 tablets (40 mg total) by mouth daily. 07/11/20   PiDavonna BellingMD  Glucose Blood (BLOOD GLUCOSE TEST STRIPS) STRP Use as directed to monitor FSBS 3x weekly. Dx: R73.09. 05/25/20   DuAlycia RossettiMD  Lancets MISC Use as directed to monitor FSBS 3x weekly. Dx: R73.09. 05/25/20   DuAlycia RossettiMD  metFORMIN (GLUCOPHAGE) 500 MG tablet Take 1 tablet (500 mg total) by mouth 2 (two) times daily with a meal. 07/11/20   PiDavonna BellingMD  nystatin (MYCOSTATIN/NYSTOP) powder Apply 1 application topically 3 (three) times daily. Patient not taking: No sig reported 01/08/20   DuAlycia RossettiMD  potassium chloride (KLOR-CON) 10 MEQ tablet Take 1 tablet (10 mEq total) by mouth daily. 07/11/20   PiDavonna BellingMD  pyridostigmine (MESTINON) 180  MG CR tablet Take 180 mg by mouth daily. 06/27/20   [provider]    Allergies    Patient has no known allergies.  Review of Systems   Review of Systems  Constitutional: Negative for appetite change.  HENT: Negative for congestion.   Eyes: Positive for visual disturbance.  Respiratory: Positive for shortness of breath.   Cardiovascular: Positive for leg swelling. Negative for chest pain.  Gastrointestinal: Negative for abdominal pain.  Musculoskeletal: Negative for arthralgias.  Skin: Negative for pallor and rash.  Neurological: Negative for weakness.  Hematological: Does not bruise/bleed easily.  Psychiatric/Behavioral: Negative for confusion.    Physical Exam Updated Vital Signs BP (!) 113/56   Pulse 67   Temp 98.6 F (37 C) (Oral)   Resp 20   Ht 5' 4"  (1.626 m)   Wt 127 kg   SpO2 99%   BMI 48.06 kg/m   Physical Exam Vitals and nursing note reviewed.  HENT:     Head: Atraumatic.  Eyes:     Pupils: Pupils are equal, round, and reactive to light.  Cardiovascular:     Rate and Rhythm: Regular rhythm.  Pulmonary:     Breath sounds: No wheezing or rales.  Chest:     Chest wall: No tenderness.  Abdominal:     Tenderness: There is no abdominal tenderness.  Musculoskeletal:     Cervical back: Neck supple.     Right lower leg: Edema present.     Left lower leg: Edema present.     Comments: Moderate pitting edema bilateral lower extremities.  Some scaling of skin but no weeping.  There is ecchymosis medially on the right ankle and right medial foot.  No underlying bony tenderness definitely palpated  Skin:    Capillary Refill: Capillary refill takes less than 2 seconds.  Neurological:     Mental Status: She is alert and oriented to person, place, and time.  Psychiatric:        Mood and Affect: Mood normal.     ED Results / Procedures / Treatments   Labs (all labs ordered are listed, but only abnormal results are displayed) Labs Reviewed   COMPREHENSIVE METABOLIC PANEL - Abnormal; Notable for the following components:      Result Value   Chloride 90 (*)    CO2 37 (*)    Glucose, Bld 534 (*)    Creatinine, Ser 1.21 (*)    Calcium 8.8 (*)    Total Protein 6.4 (*)    Albumin 3.0 (*)    GFR, Estimated 48 (*)    All other components within normal limits  CBC WITH DIFFERENTIAL/PLATELET - Abnormal; Notable for the following components:   Lymphs Abs 0.6 (*)    Abs Immature Granulocytes 0.12 (*)    All other components within normal limits  CBG MONITORING, ED - Abnormal; Notable for the following components:   Glucose-Capillary 449 (*)    All other components within normal limits  CBG MONITORING, ED - Abnormal; Notable for the following components:   Glucose-Capillary 410 (*)    All other components within normal limits  CBG MONITORING, ED - Abnormal; Notable for the following components:   Glucose-Capillary 315 (*)    All other components within normal limits  CBG MONITORING, ED - Abnormal; Notable for the following components:   Glucose-Capillary 252 (*)    All other components within normal limits    EKG None  Radiology DG Chest 2 View  Result Date: 07/11/2020 CLINICAL DATA:  Bilateral foot swelling.  No injury. EXAM: CHEST - 2 VIEW COMPARISON:  February 24, 2017 FINDINGS: Mild cardiomegaly, not seen in 2018. The hila and mediastinum are unremarkable. No pneumothorax. No pulmonary nodules or masses. No focal infiltrates. No overt edema. IMPRESSION: No active cardiopulmonary disease. Electronically Signed   By: Dorise Bullion III M.D   On: 07/11/2020 10:06   DG Ankle Complete Right  Result Date: 07/11/2020 CLINICAL DATA:  Swelling.  Pain during ambulation.  No injury. EXAM: RIGHT ANKLE - COMPLETE 3+ VIEW COMPARISON:  None. FINDINGS: Diffuse soft tissue edema. No fractures. Mild degenerative changes in the midfoot. Enthesopathic changes at the Achilles insertion site on the calcaneus. No other acute abnormalities.  IMPRESSION: Significant diffuse soft tissue swelling. Degenerative and enthesopathic changes as above. Electronically Signed   By: Dorise Bullion III M.D   On: 07/11/2020 10:12   DG Foot Complete Right  Result Date: 07/11/2020 CLINICAL DATA:  Bilateral foot swelling for a week.  No injury. EXAM: RIGHT FOOT COMPLETE - 3+ VIEW COMPARISON:  None. FINDINGS: Degenerative changes at the first MTP joint. No fractures or dislocations. No bony erosion. Significant enthesopathic changes at the posterior calcaneus. Pes planus deformity. Significant soft tissue swelling. IMPRESSION: 1. Significant soft tissue swelling. 2. Mild degenerative changes as above. 3. Enthesopathic changes at the posterior calcaneus at the Achilles insertion site. Electronically Signed   By: Dorise Bullion III M.D   On: 07/11/2020 10:08    Procedures Procedures (including critical care time)  Medications Ordered in ED Medications  insulin aspart (novoLOG) injection 8 Units (8 Units Intravenous Given 07/11/20 1026)  insulin aspart (novoLOG) injection 8 Units (8 Units Intravenous Given 07/11/20 1248)  furosemide (LASIX) injection 20 mg (20 mg Intravenous Given 07/11/20 1432)  insulin aspart (novoLOG) injection 6 Units (6 Units Intravenous Given  07/11/20 1435)    ED Course  I have reviewed the triage vital signs and the nursing notes.  Pertinent labs & imaging results that were available during my care of the patient were reviewed by me and considered in my medical decision making (see chart for details).    MDM Rules/Calculators/A&P                          Patient with peripheral edema.  I think likely secondary to her rather high steroid dose use.  Does not appear to have pulmonary infiltrates with that however.  Has some bruising on the right ankle we did have an injury a couple weeks ago.  X-ray reassuring now.  But has moderate edema to the legs.  Will increase patient's Lasix at home and was given an IV Lasix dose  here.  Also will supplement potassium at home. Also found to be hyperglycemic.  I think also secondary to the sugar.  Not in DKA.  Sugar lowered with insulin.  Fluid given judiciously due to being volume overloaded overall.  Will start Metformin.  Outpatient follow-up with PCP and will need to talk with neurology about potential change in medication although she has been on steroids for a while and cannot quickly stop them.   Final Clinical Impression(s) / ED Diagnoses Final diagnoses:  Hyperglycemia  Peripheral edema    Rx / DC Orders ED Discharge Orders         Ordered    metFORMIN (GLUCOPHAGE) 500 MG tablet  2 times daily with meals        07/11/20 1505    furosemide (LASIX) 20 MG tablet  Daily        07/11/20 1505    potassium chloride (KLOR-CON) 10 MEQ tablet  Daily        07/11/20 1505           Davonna Belling, MD 07/11/20 1541

## 2020-07-11 NOTE — ED Triage Notes (Signed)
Pt c/o of bilateral feet swelling x 1 week with pain during ambulation

## 2020-07-11 NOTE — Discharge Instructions (Signed)
We are going to start Metformin twice a day for at least the next month.  Follow-up with Dr. Buelah Manis for this.  Talk with Dr. Leta Baptist about any changes in the prednisone dose. You also are caring extra fluid on your legs.  I am going to increase your Lasix to twice a day for the next 10 days.  I will also start some potassium since Lasix can make you lose this.  Follow-up with Dr. Buelah Manis.  Follow-up sooner if you feel lightheaded or dizzy.

## 2020-07-11 NOTE — ED Notes (Signed)
Pt placed on a Purewick at this time.

## 2020-07-14 ENCOUNTER — Ambulatory Visit (INDEPENDENT_AMBULATORY_CARE_PROVIDER_SITE_OTHER): Payer: Medicare Other | Admitting: Family Medicine

## 2020-07-14 ENCOUNTER — Encounter: Payer: Self-pay | Admitting: Family Medicine

## 2020-07-14 ENCOUNTER — Other Ambulatory Visit: Payer: Self-pay

## 2020-07-14 VITALS — BP 144/72 | HR 82 | Temp 97.8°F | Resp 16 | Wt 269.0 lb

## 2020-07-14 DIAGNOSIS — R739 Hyperglycemia, unspecified: Secondary | ICD-10-CM | POA: Diagnosis not present

## 2020-07-14 DIAGNOSIS — G7000 Myasthenia gravis without (acute) exacerbation: Secondary | ICD-10-CM | POA: Diagnosis not present

## 2020-07-14 DIAGNOSIS — R7303 Prediabetes: Secondary | ICD-10-CM

## 2020-07-14 DIAGNOSIS — T50905A Adverse effect of unspecified drugs, medicaments and biological substances, initial encounter: Secondary | ICD-10-CM | POA: Diagnosis not present

## 2020-07-14 DIAGNOSIS — R609 Edema, unspecified: Secondary | ICD-10-CM | POA: Diagnosis not present

## 2020-07-14 NOTE — Progress Notes (Signed)
° °  Subjective:    Patient ID: Kathrynn Speed, female    DOB: 10-10-1950, 69 y.o.   MRN: [id number]  Patient presents for Follow-up and Diabetes (ER F/U elevated blood sugar > 500)     Pt went to ER due to swelling, bruising right ankle, she had fall 2 wekes ago, she tripped going into a door and fell on her face  xrays done in ER of leg , no fracture  She was given IV lasix and given script for 40mg  once a day for 10 days   The swelling is going down some   she does use compression hose    Her labs showed CBG  > 500 , she has history of borderline DM, but she has been on high dose prednisone 60mg  once a day for her Ocular Myasthenia     She was started on metformin 500mg  twice a day    States last CBG was 252 at ER, she has NOT checked it since then    She felt very weak and thristy prior to going to ER    Review Of Systems:  GEN- denies fatigue, fever, weight loss,weakness, recent illness HEENT- denies eye drainage, change in vision, nasal discharge, CVS- denies chest pain, palpitations RESP- denies SOB, cough, wheeze ABD- denies N/V, change in stools, abd pain GU- denies dysuria, hematuria, dribbling, incontinence MSK- +joint pain, muscle aches, injury Neuro- denies headache, dizziness, syncope, seizure activity       Objective:    BP (!) 144/72    Pulse 82    Temp 97.8 F (36.6 C)    Resp 16    Wt 269 lb (122 kg)    SpO2 96%    BMI 46.17 kg/m  GEN- NAD, alert and oriented x3, sitting in wheelchair, but able to ambulate with cane HEENT- PERRL, EOMI, non injected sclera, pink conjunctiva, MMM, oropharynx clear Neck- Supple, no thyromegaly , no JVD  CVS- RRR, no murmur RESP-CTAB ABD-NABS,soft,NT,ND EXT- 1+  Pitting edema ,bruising right lower leg, medial aspect foot  Pulses- Radial  2+        Assessment & Plan:      Problem List Items Addressed This Visit      Unprioritized   Edema   Ocular myasthenia gravis ([place])    Regards to her neurologist regarding the  high dose of prednisone if we can taper down some..  The meantime she will treat the hyperglycemia with Metformin I will adjust this dose depending on what her A1c and renal function looks like today.  I suspect that she will need some type of diabetes treatment while she is on the prednisone.   For the peripheral edema she does have compression hose at home.  She will complete the 10 days of Lasix 40 mg once a day.      Pre-diabetes    Other Visit Diagnoses    Hyperglycemia, drug-induced    -  Primary   Relevant Orders   Basic metabolic panel   Hemoglobin A1c      Note: This dictation was prepared with Dragon dictation along with smaller phrase technology. Any transcriptional errors that result from this process are unintentional.

## 2020-07-14 NOTE — Patient Instructions (Signed)
I will call your neurologist about the prednisone dose Continue the metformin twice a day with food Take all the lasix - for fluid Wear your compression  F/u AS PREVIOUS

## 2020-07-14 NOTE — Assessment & Plan Note (Signed)
Regards to her neurologist regarding the high dose of prednisone if we can taper down some..  The meantime she will treat the hyperglycemia with Metformin I will adjust this dose depending on what her A1c and renal function looks like today.  I suspect that she will need some type of diabetes treatment while she is on the prednisone.   For the peripheral edema she does have compression hose at home.  She will complete the 10 days of Lasix 40 mg once a day.

## 2020-07-15 ENCOUNTER — Other Ambulatory Visit: Payer: Self-pay | Admitting: *Deleted

## 2020-07-15 DIAGNOSIS — R531 Weakness: Secondary | ICD-10-CM | POA: Diagnosis not present

## 2020-07-15 DIAGNOSIS — W19XXXA Unspecified fall, initial encounter: Secondary | ICD-10-CM | POA: Diagnosis not present

## 2020-07-15 DIAGNOSIS — R5381 Other malaise: Secondary | ICD-10-CM | POA: Diagnosis not present

## 2020-07-15 LAB — HEMOGLOBIN A1C
Hgb A1c MFr Bld: 10.2 % of total Hgb — ABNORMAL HIGH (ref <5.7)
Mean Plasma Glucose: 246 mg/dL
eAG (mmol/L): 13.6 mmol/L

## 2020-07-15 LAB — BASIC METABOLIC PANEL
BUN/Creatinine Ratio: 23 (calc) — ABNORMAL HIGH (ref 6–22)
BUN: 30 mg/dL — ABNORMAL HIGH (ref 7–25)
CO2: 38 mmol/L — ABNORMAL HIGH (ref 20–32)
Calcium: 9.1 mg/dL (ref 8.6–10.4)
Chloride: 93 mmol/L — ABNORMAL LOW (ref 98–110)
Creat: 1.3 mg/dL — ABNORMAL HIGH (ref 0.60–0.93)
Glucose, Bld: 325 mg/dL — ABNORMAL HIGH (ref 65–99)
Potassium: 5.3 mmol/L (ref 3.5–5.3)
Sodium: 139 mmol/L (ref 135–146)

## 2020-07-15 NOTE — Telephone Encounter (Signed)
-----   Message from Penni Bombard, MD sent at 07/14/2020  6:18 PM EST ----- Regarding: RE: Prednisone dosing Thank you for the update. I think we can start to reduce prednisone. We can reduce by 10mg  daily every month, until she is at 30mg  daily (i.e. 50mg  daily x 1 month, then 40mg  daily x 1 month, then 30mg  daily). I am going to be out of the office until next Monday, but will ask my nurse to reach out to her to make this change.  -VRP   ----- Message ----- From: Alycia Rossetti, MD Sent: 07/14/2020   5:42 PM EST To: Penni Bombard, MD Subject: Prednisone dosing                                Hello,   Ms, Espe, was in the ER recently due to swelling and hyperglycemia, Her sugar was > 500. While I can treat the blood sugars, I think the sudden change is due to the Prednisone 60mg  she is on daily for her Myasthenia. Can this dose be reduced ? She was under the impression she was to stay on this high dose until her appt in the spring.    Kevan Rosebush

## 2020-07-15 NOTE — Telephone Encounter (Addendum)
Spoke to daughter, [name], as could not connect with pt, her VM not set up on her phone.  I relayed that pt is now 60mg  po daily pf prednisone per Dr. Buelah Manis. .  Due to elevated blood sugars per pcp, Dr. Leta Baptist will decrease to by 10mg  each month until she is taking 30mg  daily.  She verbalized understanding. Will place new prescription.   I called daughter back and she is taking 40mg ?  She was not sure.  Would have pt call back.

## 2020-07-16 ENCOUNTER — Telehealth: Payer: Self-pay | Admitting: Family Medicine

## 2020-07-16 MED ORDER — PREDNISONE 10 MG PO TABS: ORAL_TABLET | ORAL | 12 refills | Status: DC

## 2020-07-16 NOTE — Telephone Encounter (Signed)
Patient left vm yesterday asking for Margreta Journey to call her back in reference to her medication.  I have tried calling patient back to get additional information however the voicemail isn't set up.  CB# [phone]

## 2020-07-16 NOTE — Telephone Encounter (Signed)
Unable to reach patient on her cell #. Called  "home" # but no answer. I didn't leave message. Called daughter [name] and advised I am unable to reach her. She stated she would call her now.  Received call back from patient. She stated Prednisone was decreased to 40 mg daily since Sat per Dr Buelah Manis. I advised she stay on 40 mg daily for one month, then decrease to 30 mg daily. She will mark her calendar, had no questions,  verbalized understanding, appreciation.

## 2020-07-20 NOTE — Telephone Encounter (Signed)
Call placed to patient. No answer. No VM.  

## 2020-07-21 NOTE — Telephone Encounter (Signed)
Call placed to patient.   States that she does not remember why she was calling. Advised to contact office if she has any further questions.

## 2020-07-22 DIAGNOSIS — J449 Chronic obstructive pulmonary disease, unspecified: Secondary | ICD-10-CM | POA: Diagnosis not present

## 2020-07-29 ENCOUNTER — Telehealth: Payer: Self-pay | Admitting: *Deleted

## 2020-07-29 NOTE — Telephone Encounter (Signed)
Noted  

## 2020-07-29 NOTE — Telephone Encounter (Signed)
Received call from patient.   Reports that she is returning to school for business administration. Reports that school is requesting PCP to advise if patient will be able to tolerate school and then be able to work with current disability.   Advised that we will need forms sent to office. Appointment scheduled.

## 2020-08-04 ENCOUNTER — Ambulatory Visit (INDEPENDENT_AMBULATORY_CARE_PROVIDER_SITE_OTHER): Payer: Medicare Other | Admitting: Family Medicine

## 2020-08-04 ENCOUNTER — Other Ambulatory Visit: Payer: Self-pay

## 2020-08-04 ENCOUNTER — Other Ambulatory Visit: Payer: Self-pay | Admitting: Family Medicine

## 2020-08-04 ENCOUNTER — Encounter: Payer: Self-pay | Admitting: Family Medicine

## 2020-08-04 VITALS — BP 128/74 | HR 90 | Temp 98.4°F | Resp 14 | Ht 64.5 in | Wt 264.0 lb

## 2020-08-04 DIAGNOSIS — I872 Venous insufficiency (chronic) (peripheral): Secondary | ICD-10-CM | POA: Diagnosis not present

## 2020-08-04 DIAGNOSIS — E099 Drug or chemical induced diabetes mellitus without complications: Secondary | ICD-10-CM | POA: Diagnosis not present

## 2020-08-04 DIAGNOSIS — L97811 Non-pressure chronic ulcer of other part of right lower leg limited to breakdown of skin: Secondary | ICD-10-CM

## 2020-08-04 DIAGNOSIS — T380X5D Adverse effect of glucocorticoids and synthetic analogues, subsequent encounter: Secondary | ICD-10-CM | POA: Diagnosis not present

## 2020-08-04 DIAGNOSIS — M159 Polyosteoarthritis, unspecified: Secondary | ICD-10-CM

## 2020-08-04 DIAGNOSIS — R609 Edema, unspecified: Secondary | ICD-10-CM

## 2020-08-04 DIAGNOSIS — M8949 Other hypertrophic osteoarthropathy, multiple sites: Secondary | ICD-10-CM | POA: Diagnosis not present

## 2020-08-04 MED ORDER — HYDROCODONE-ACETAMINOPHEN 7.5-325 MG PO TABS: 1.0000 | ORAL_TABLET | Freq: Two times a day (BID) | ORAL | 0 refills | Status: DC | PRN

## 2020-08-04 MED ORDER — FUROSEMIDE 40 MG PO TABS: 40.0000 mg | ORAL_TABLET | Freq: Every day | ORAL | 1 refills | Status: DC

## 2020-08-04 MED ORDER — POTASSIUM CHLORIDE ER 10 MEQ PO TBCR: 10.0000 meq | EXTENDED_RELEASE_TABLET | Freq: Every day | ORAL | 1 refills | Status: DC

## 2020-08-04 NOTE — Progress Notes (Signed)
   Subjective:    Patient ID: [name], female    DOB: Feb 21, 1951, 70 y.o.   MRN: [id number]  Patient presents for Edema (X1 week- BLE edema- intermittent weeping from legs), Open Wound (R inner ankle- nickel sized open area), and R Hip Pain (Sharp pain in joint- worsens with movement)  Pt here with swelling in legs has clear fluid weeping out. She has wound on right lower leg for about 2 weeks, that won't heal and it has been draining. Her legs feel very ight.  Weight down5lbs since visit in December She denies any injury to the skin She did take a 10 days supply of potassium with the water pill back in December, states swelling came up out of no where a few days ago She does admit to eating a lot of canned goods and frozen dinners  Right hip startd hurting this morning, has known OA, no meds tried yet, no injury jut just aches  She needs refill on norco   DM- she is taking her metformin but doesn't know how to work her glucometer  She is currently on 40mg  of steroids , this has been tapered down by her neurologist   Review Of Systems:  GEN- denies fatigue, fever, weight loss,weakness, recent illness HEENT- denies eye drainage, change in vision, nasal discharge, CVS- denies chest pain, palpitations RESP- denies SOB, cough, wheeze ABD- denies N/V, change in stools, abd pain GU- denies dysuria, hematuria, dribbling, incontinence MSK- + joint pain, muscle aches, injury Neuro- denies headache, dizziness, syncope, seizure activity       Objective:    BP 128/74   Pulse 90   Temp 98.4 F (36.9 C) (Temporal)   Resp 14   Ht 5' 4.5" (1.638 m)   Wt 264 lb (119.7 kg)   SpO2 96%   BMI 44.62 kg/m  GEN- NAD, alert and oriented x3 HEENT- PERRL, EOMI, non injected sclera/non icteric  Neck- Supple, no JVD CVS- RRR, no murmur RESP-CTAB MSK - fair ROM HIPS/KNEES, no effusion, hip NT, no limp EXT-2+ weeping edema, mild erythema bilat legs dry skin/flaky, right lower leg   Superficial 1-1.5cm ulceration  Pulses- Radial 2+, feet warm to touch         Assessment & Plan:      Problem List Items Addressed This Visit      Unprioritized   Osteoarthritis   Relevant Medications   HYDROcodone-acetaminophen (NORCO) 7.5-325 MG tablet   Peripheral edema - Primary    Recurrent edema with venous statis ulcer/weeping Unna boots applied - recheck Friday Restart lasix at 40mg  once a day with potassium Reviewed recent metabolic panel Due to [id number]-[id number], we do not have lab capabilities today, will plan to recheck labs in 1 week to make sure she is tolerating fluid pill The extra steroids/sodium in diet can all attribute to the worsening swelling       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[place])    Continue metformin, patient shown how to use glucometer in office Goal 80-120 fasting cbg STEROIDS are being slowly tapered down        Other Visit Diagnoses    Venous stasis ulcer of other part of right lower leg limited to breakdown of skin without varicose veins ([place])          Note: This dictation was prepared with Dragon dictation along with smaller phrase technology. Any transcriptional errors that result from this process are unintentional.

## 2020-08-04 NOTE — Patient Instructions (Addendum)
Call your neurologist about the myasthenia medicine [phone] Take lasix 40mg   With potassium once a day in the morning Fasting blood sugar goal 80-120  F/U Friday IN OFFICE [employer] removal

## 2020-08-04 NOTE — Assessment & Plan Note (Signed)
Continue metformin, patient shown how to use glucometer in office Goal 80-120 fasting cbg STEROIDS are being slowly tapered down

## 2020-08-04 NOTE — Assessment & Plan Note (Signed)
Recurrent edema with venous statis ulcer/weeping Unna boots applied - recheck Friday Restart lasix at 40mg  once a day with potassium Reviewed recent metabolic panel Due to AQVOH-20, we do not have lab capabilities today, will plan to recheck labs in 1 week to make sure she is tolerating fluid pill The extra steroids/sodium in diet can all attribute to the worsening swelling

## 2020-08-05 ENCOUNTER — Telehealth: Payer: Self-pay | Admitting: Diagnostic Neuroimaging

## 2020-08-05 NOTE — Telephone Encounter (Signed)
t is asking for a call from RN to discuss a medication with RN.  Pt could not tell the name of the medication.

## 2020-08-05 NOTE — Telephone Encounter (Signed)
Called patient who stated she needs refill on mestinon. I advised her she has refills on file with [facility], [place]. Advised she call them. Patient verbalized understanding, appreciation.

## 2020-08-07 ENCOUNTER — Ambulatory Visit (INDEPENDENT_AMBULATORY_CARE_PROVIDER_SITE_OTHER): Payer: Medicare Other | Admitting: Family Medicine

## 2020-08-07 ENCOUNTER — Encounter: Payer: Self-pay | Admitting: Family Medicine

## 2020-08-07 ENCOUNTER — Other Ambulatory Visit: Payer: Self-pay

## 2020-08-07 VITALS — BP 134/80 | HR 82 | Temp 98.1°F | Resp 14 | Ht 64.5 in | Wt 264.0 lb

## 2020-08-07 DIAGNOSIS — I872 Venous insufficiency (chronic) (peripheral): Secondary | ICD-10-CM | POA: Diagnosis not present

## 2020-08-07 DIAGNOSIS — L97811 Non-pressure chronic ulcer of other part of right lower leg limited to breakdown of skin: Secondary | ICD-10-CM | POA: Diagnosis not present

## 2020-08-07 DIAGNOSIS — R609 Edema, unspecified: Secondary | ICD-10-CM | POA: Diagnosis not present

## 2020-08-07 NOTE — Patient Instructions (Signed)
F/U Tuesday, for unna boot removal/labs

## 2020-08-07 NOTE — Assessment & Plan Note (Signed)
Erythema has improved and the ulceration does not have any drainage.  She also has less weeping from the legs.  We will wrap her legs again with Unna boots and try to keep elevated.  She will return to have these removed in 5 days.  She will continue the Lasix.  We will check her renal function next week.

## 2020-08-07 NOTE — Progress Notes (Signed)
   Subjective:    Patient ID: [name], female    DOB: 02/24/1951, 70 y.o.   MRN: [id number]  Patient presents for Follow-up (Peripheral edema- remove UNNA boots/)  Patient here for interim follow-up routine OB removal.  He is replaced on January 11.  She had weeping edema with venous stasis ulceration superficial.  She states that her legs feel much better with that milligrams alone.  When we remove them at the bedside she still has 2 spots that are weeping on the right leg but not on the left.  The superficial ulcerated area on the right leg does not have any erythema or drainage.  Her legs however still have 2+ edema.  She is taking the Lasix as prescribed as well.  No new concerns today.   Review Of Systems:  GEN- denies fatigue, fever, weight loss,weakness, recent illness CVS- denies chest pain, palpitations RESP- denies SOB, cough, wheeze MSK- denies joint pain, muscle aches, injury        Objective:    BP 134/80   Pulse 82   Temp 98.1 F (36.7 C) (Temporal)   Resp 14   Ht 5' 4.5" (1.638 m)   Wt 264 lb (119.7 kg)   SpO2 98%   BMI 44.62 kg/m  GEN- NAD, alert and oriented x3 CVS- RRR, no murmur RESP-CTAB EXT-2+ mild pitting edema,no erythema bilat legs ,dry skin/flaky, right lower leg  Superficial 1-1.5cm ulceration right lower leg and small abrasion above it, 2 areas weeping posteriorly right leg, no weeping left leg  Pulses- Radial 2+, feet warm to touch , DP not palpated          Assessment & Plan:      Problem List Items Addressed This Visit      Unprioritized   Peripheral edema - Primary    Erythema has improved and the ulceration does not have any drainage.  She also has less weeping from the legs.  We will wrap her legs again with Unna boots and try to keep elevated.  She will return to have these removed in 5 days.  She will continue the Lasix.  We will check her renal function next week.       Other Visit Diagnoses    Venous stasis ulcer of  other part of right lower leg limited to breakdown of skin without varicose veins ([place])          Note: This dictation was prepared with Dragon dictation along with smaller phrase technology. Any transcriptional errors that result from this process are unintentional.

## 2020-08-11 ENCOUNTER — Other Ambulatory Visit: Payer: Self-pay

## 2020-08-11 ENCOUNTER — Telehealth: Payer: Medicare Other | Admitting: Family Medicine

## 2020-08-12 ENCOUNTER — Ambulatory Visit (INDEPENDENT_AMBULATORY_CARE_PROVIDER_SITE_OTHER): Payer: Medicare Other | Admitting: Family Medicine

## 2020-08-12 ENCOUNTER — Other Ambulatory Visit: Payer: Self-pay

## 2020-08-12 ENCOUNTER — Encounter: Payer: Self-pay | Admitting: Family Medicine

## 2020-08-12 VITALS — BP 138/84 | HR 82 | Temp 99.1°F | Resp 14 | Ht 64.5 in | Wt 259.0 lb

## 2020-08-12 DIAGNOSIS — L97811 Non-pressure chronic ulcer of other part of right lower leg limited to breakdown of skin: Secondary | ICD-10-CM | POA: Diagnosis not present

## 2020-08-12 DIAGNOSIS — R609 Edema, unspecified: Secondary | ICD-10-CM | POA: Diagnosis not present

## 2020-08-12 DIAGNOSIS — I872 Venous insufficiency (chronic) (peripheral): Secondary | ICD-10-CM | POA: Diagnosis not present

## 2020-08-12 DIAGNOSIS — F419 Anxiety disorder, unspecified: Secondary | ICD-10-CM

## 2020-08-12 MED ORDER — METFORMIN HCL 500 MG PO TABS: 500.0000 mg | ORAL_TABLET | Freq: Two times a day (BID) | ORAL | 3 refills | Status: DC

## 2020-08-12 MED ORDER — BUSPIRONE HCL 5 MG PO TABS: 5.0000 mg | ORAL_TABLET | Freq: Two times a day (BID) | ORAL | 1 refills | Status: DC

## 2020-08-12 NOTE — Patient Instructions (Addendum)
F/U as previous in Feb  Start buspar 5mg  twice a day for your nerves

## 2020-08-12 NOTE — Assessment & Plan Note (Addendum)
Much improved , continue lasix,  She still has some swelling but doesn't feel like she cant get her compression hose on.  Due to weather coming in, I am worried about wrapping with Unna boots again, she cant remove them at home  ACE wraps applied for compression and pt can easily remove Check renal function to ensure tolerating lasix

## 2020-08-12 NOTE — Assessment & Plan Note (Signed)
She has a history of anxiety along with some depression and insomnia issues.  She is already on trazodone doing well with Regards to her sleep.  That prednisone can cause an increase anxiety just because the side effects of the medication however she is being tapered down in the setting of her myasthenia.  She does take hydrocodone pain medicine of few days a week therefore I do not want to put her on a benzo.  She is adamant that she just feels anxious and nervous during the day.  We will start her on BuSpar 5 mg twice a day we will follow this up in 3 to 4 weeks.  She will continue the trazodone at bedtime

## 2020-08-12 NOTE — Progress Notes (Signed)
Subjective:    Patient ID: [name], female    DOB: 11/25/1950, 70 y.o.   MRN: [id number]  Patient presents for Follow-up (Peripheral edema- UNNA boots)  Patient here for interim visit for Unna boot removal for peripheral edema and venous stasis.  She has had Unna boots on for 6 days secondary to inclement weather.  She has not had any issues with the wraps.  She is also on Lasix 40 mg once a day.  She is due for recheck on her renal function  Now on Prednisone 30mg  once a day  CBG today was  182  Weight down 5lbs in the past week.  Today she states that her nerves have been bad.  States that she has been nervous and anxious since she has been on the prednisone.  Her sleep is okay as long she is taking her trazodone 100 mg but during the daytime she just feels on edge.  I asked why she did not bring this up the past couple of visits she states that she always feels like that but today seemed worse for some reason.  She did not give me any specifics about anything that may have triggered her but asked for something to help calm her nerves.  Review Of Systems:  GEN- denies fatigue, fever, weight loss,weakness, recent illness HEENT- denies eye drainage, change in vision, nasal discharge, CVS- denies chest pain, palpitations RESP- denies SOB, cough, wheeze ABD- denies N/V, change in stools, abd pain GU- denies dysuria, hematuria, dribbling, incontinence MSK- denies joint pain, muscle aches, injury Neuro- denies headache, dizziness, syncope, seizure activity       Objective:    BP 138/84    Pulse 82    Temp 99.1 F (37.3 C) (Temporal)    Resp 14    Ht 5' 4.5" (1.638 m)    Wt 259 lb (117.5 kg)    SpO2 98%    BMI 43.77 kg/m  GEN- NAD, alert and oriented x3 CVS- RRR, no murmur RESP-CTAB EXT-improved pitting edema,no erythema bilat legs ,dry skin/flaky, right lower leg Superficial 1cm ulceration right lower leg  , mild swelling top of feet  Pulses- Radial 2+, feet warm to  touch, DP not palpated  Psych-mild depressed affect not overly anxious good eye contact normal speech     Assessment & Plan:      Problem List Items Addressed This Visit      Unprioritized   Anxiety    She has a history of anxiety along with some depression and insomnia issues.  She is already on trazodone doing well with Regards to her sleep.  That prednisone can cause an increase anxiety just because the side effects of the medication however she is being tapered down in the setting of her myasthenia.  She does take hydrocodone pain medicine of few days a week therefore I do not want to put her on a benzo.  She is adamant that she just feels anxious and nervous during the day.  We will start her on BuSpar 5 mg twice a day we will follow this up in 3 to 4 weeks.  She will continue the trazodone at bedtime      Relevant Medications   busPIRone (BUSPAR) 5 MG tablet   Peripheral edema - Primary    Much improved , continue lasix,  She still has some swelling but doesn't feel like she cant get her compression hose on.  Due to weather coming in, I am worried  about wrapping with Unna boots again, she cant remove them at home  ACE wraps applied for compression and pt can easily remove Check renal function to ensure tolerating lasix       Relevant Orders   Basic metabolic panel (Completed)    Other Visit Diagnoses    Venous stasis ulcer of other part of right lower leg limited to breakdown of skin without varicose veins ([facility])       Relevant Orders   Basic metabolic panel (Completed)      Note: This dictation was prepared with Dragon dictation along with smaller phrase technology. Any transcriptional errors that result from this process are unintentional.

## 2020-08-13 ENCOUNTER — Encounter: Payer: Self-pay | Admitting: Family Medicine

## 2020-08-13 LAB — BASIC METABOLIC PANEL
BUN/Creatinine Ratio: 18 (calc) (ref 6–22)
BUN: 23 mg/dL (ref 7–25)
CO2: 32 mmol/L (ref 20–32)
Calcium: 8.9 mg/dL (ref 8.6–10.4)
Chloride: 100 mmol/L (ref 98–110)
Creat: 1.3 mg/dL — ABNORMAL HIGH (ref 0.60–0.93)
Glucose, Bld: 159 mg/dL — ABNORMAL HIGH (ref 65–99)
Potassium: 4.9 mmol/L (ref 3.5–5.3)
Sodium: 143 mmol/L (ref 135–146)

## 2020-08-14 ENCOUNTER — Encounter: Payer: Self-pay | Admitting: *Deleted

## 2020-08-22 DIAGNOSIS — J449 Chronic obstructive pulmonary disease, unspecified: Secondary | ICD-10-CM | POA: Diagnosis not present

## 2020-08-30 ENCOUNTER — Other Ambulatory Visit: Payer: Self-pay | Admitting: Family Medicine

## 2020-09-01 ENCOUNTER — Other Ambulatory Visit: Payer: Self-pay | Admitting: Family Medicine

## 2020-09-02 ENCOUNTER — Other Ambulatory Visit (HOSPITAL_COMMUNITY): Payer: Self-pay | Admitting: Family Medicine

## 2020-09-02 DIAGNOSIS — Z1231 Encounter for screening mammogram for malignant neoplasm of breast: Secondary | ICD-10-CM

## 2020-09-04 ENCOUNTER — Other Ambulatory Visit: Payer: Self-pay | Admitting: Family Medicine

## 2020-09-04 DIAGNOSIS — M159 Polyosteoarthritis, unspecified: Secondary | ICD-10-CM

## 2020-09-04 DIAGNOSIS — M8949 Other hypertrophic osteoarthropathy, multiple sites: Secondary | ICD-10-CM

## 2020-09-04 MED ORDER — HYDROCODONE-ACETAMINOPHEN 7.5-325 MG PO TABS
1.0000 | ORAL_TABLET | Freq: Two times a day (BID) | ORAL | 0 refills | Status: DC | PRN
Start: 2020-09-04 — End: 2020-09-30

## 2020-09-04 NOTE — Telephone Encounter (Signed)
Script for shoes done

## 2020-09-04 NOTE — Telephone Encounter (Signed)
Pt called needing a refill of   HYDROcodone-acetaminophen (NORCO) 7.5-325 MG tablet Sent to pharmacy  Pt also needs referral for diabetic shoes. Cb#: [phone]

## 2020-09-04 NOTE — Telephone Encounter (Signed)
Ok to refill Hydrocodone/APAP?? Last office visit 08/12/2020. Last refill 08/04/2020.  Please advise on diabetic shoes.

## 2020-09-06 ENCOUNTER — Other Ambulatory Visit: Payer: Self-pay | Admitting: Family Medicine

## 2020-09-15 ENCOUNTER — Ambulatory Visit: Payer: Medicare Other | Admitting: Family Medicine

## 2020-09-16 ENCOUNTER — Other Ambulatory Visit: Payer: Self-pay

## 2020-09-16 ENCOUNTER — Encounter: Payer: Self-pay | Admitting: Family Medicine

## 2020-09-16 ENCOUNTER — Ambulatory Visit (INDEPENDENT_AMBULATORY_CARE_PROVIDER_SITE_OTHER): Payer: Medicare Other | Admitting: Family Medicine

## 2020-09-16 VITALS — BP 118/70 | HR 96 | Temp 96.8°F | Ht 64.5 in | Wt 260.0 lb

## 2020-09-16 DIAGNOSIS — T380X5D Adverse effect of glucocorticoids and synthetic analogues, subsequent encounter: Secondary | ICD-10-CM

## 2020-09-16 DIAGNOSIS — R609 Edema, unspecified: Secondary | ICD-10-CM | POA: Diagnosis not present

## 2020-09-16 DIAGNOSIS — E099 Drug or chemical induced diabetes mellitus without complications: Secondary | ICD-10-CM

## 2020-09-16 DIAGNOSIS — R600 Localized edema: Secondary | ICD-10-CM

## 2020-09-16 NOTE — Patient Instructions (Signed)
Increase lasix to 1 tablet twice a day for 1 week  Unna Boots applied  F/U Monday for recheck - okay to put in same day spot

## 2020-09-16 NOTE — Assessment & Plan Note (Signed)
Interim A1c.  He has not been able to keep up with her blood sugars.  Her A1c is at 10% after being on high-dose steroids for her myasthenia gravis.  We will adjust her meds or meds based on her kidney function.  We may need to add another agent as I am also tolerating her Lasix and her renal function may not be able to tolerate both of the medications.

## 2020-09-16 NOTE — Progress Notes (Signed)
   Subjective:    Patient ID: [name], female    DOB: October 20, 1950, 70 y.o.   MRN: [id number]  Patient presents for peripheral edema  Patient here with recurrent leg edema.  States her her leg swelled up she had to cut off her compression hose.  She has weeping out of her right 1 out of they are both swollen like for her.  She did do well with it in the nosebleeds would like to have this placed again.  She has been taking her Lasix and trying to watch the sodium in her diet. Weight up 1lb sine Jan No chest pain, no SOB  Last A1C 10.2% in December He is on Metformin 500mg  twice a day he has not been able to check her blood sugar that she did not know how to use the lancet device.    Review Of Systems:  GEN- denies fatigue, fever, weight loss,weakness, recent illness HEENT- denies eye drainage, change in vision, nasal discharge, CVS- denies chest pain, palpitations RESP- denies SOB, cough, wheeze ABD- denies N/V, change in stools, abd pain GU- denies dysuria, hematuria, dribbling, incontinence MSK- + joint pain, muscle aches, injury Neuro- denies headache, dizziness, syncope, seizure activity       Objective:    BP 118/70   Pulse 96   Temp (!) 96.8 F (36 C)   Ht 5' 4.5" (1.638 m)   Wt 260 lb (117.9 kg)   SpO2 95%   BMI 43.94 kg/m  GEN- NAD, alert and oriented x3 HEENT- PERRL, EOMI, non injected sclera, pink conjunctiva, MMM, oropharynx clear Neck- Supple, no  JVD  CVS- RRR, no murmur RESP-CTAB ABD-NABS,soft,NT,ND EXT- 2+ edema , weeping right lower leg, no ulcations  Pulses- Radial 2+, DP- diminished        Assessment & Plan:      Problem List Items Addressed This Visit      Unprioritized   Peripheral edema - Primary    Unna boots placed today at the bedside.  We will remove them in 5 days.  She will increase her Lasix to twice a day      Relevant Orders   Basic metabolic panel   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[place])    Interim A1c.  He has not  been able to keep up with her blood sugars.  Her A1c is at 10% after being on high-dose steroids for her myasthenia gravis.  We will adjust her meds or meds based on her kidney function.  We may need to add another agent as I am also tolerating her Lasix and her renal function may not be able to tolerate both of the medications.      Relevant Orders   Hemoglobin A1c      Note: This dictation was prepared with Dragon dictation along with smaller phrase technology. Any transcriptional errors that result from this process are unintentional.

## 2020-09-16 NOTE — Assessment & Plan Note (Signed)
Unna boots placed today at the bedside.  We will remove them in 5 days.  She will increase her Lasix to twice a day

## 2020-09-17 LAB — BASIC METABOLIC PANEL
BUN/Creatinine Ratio: 17 (calc) (ref 6–22)
BUN: 21 mg/dL (ref 7–25)
CO2: 33 mmol/L — ABNORMAL HIGH (ref 20–32)
Calcium: 9.3 mg/dL (ref 8.6–10.4)
Chloride: 100 mmol/L (ref 98–110)
Creat: 1.25 mg/dL — ABNORMAL HIGH (ref 0.60–0.93)
Glucose, Bld: 89 mg/dL (ref 65–99)
Potassium: 4.7 mmol/L (ref 3.5–5.3)
Sodium: 143 mmol/L (ref 135–146)

## 2020-09-17 LAB — HEMOGLOBIN A1C
Hgb A1c MFr Bld: 8.7 % of total Hgb — ABNORMAL HIGH (ref <5.7)
Mean Plasma Glucose: 203 mg/dL
eAG (mmol/L): 11.2 mmol/L

## 2020-09-21 ENCOUNTER — Ambulatory Visit (INDEPENDENT_AMBULATORY_CARE_PROVIDER_SITE_OTHER): Payer: Medicare Other | Admitting: Family Medicine

## 2020-09-21 ENCOUNTER — Other Ambulatory Visit: Payer: Self-pay

## 2020-09-21 VITALS — BP 126/74 | HR 82 | Temp 98.4°F | Resp 16 | Ht 64.5 in | Wt 257.0 lb

## 2020-09-21 DIAGNOSIS — I10 Essential (primary) hypertension: Secondary | ICD-10-CM

## 2020-09-21 DIAGNOSIS — T380X5D Adverse effect of glucocorticoids and synthetic analogues, subsequent encounter: Secondary | ICD-10-CM | POA: Diagnosis not present

## 2020-09-21 DIAGNOSIS — E099 Drug or chemical induced diabetes mellitus without complications: Secondary | ICD-10-CM | POA: Diagnosis not present

## 2020-09-21 DIAGNOSIS — R609 Edema, unspecified: Secondary | ICD-10-CM | POA: Diagnosis not present

## 2020-09-21 DIAGNOSIS — J449 Chronic obstructive pulmonary disease, unspecified: Secondary | ICD-10-CM | POA: Diagnosis not present

## 2020-09-21 LAB — BASIC METABOLIC PANEL
BUN/Creatinine Ratio: 18 (calc) (ref 6–22)
BUN: 28 mg/dL — ABNORMAL HIGH (ref 7–25)
CO2: 27 mmol/L (ref 20–32)
Calcium: 7.8 mg/dL — ABNORMAL LOW (ref 8.6–10.4)
Chloride: 100 mmol/L (ref 98–110)
Creat: 1.57 mg/dL — ABNORMAL HIGH (ref 0.60–0.93)
Glucose, Bld: 89 mg/dL (ref 65–99)
Potassium: 4.3 mmol/L (ref 3.5–5.3)
Sodium: 138 mmol/L (ref 135–146)

## 2020-09-21 NOTE — Progress Notes (Signed)
   Subjective:    Patient ID: [name], female    DOB: 04/10/1951, 70 y.o.   MRN: [id number]  Patient presents for Follow-up (Peripheral edema)  Pt here for interim follow up on her peripheral edema  Weight down 3lbs since last week  taking lasix 40mg  twice a day  Left foot, rubbed a place on heel feels irritated Doesn't want unna boots again, they feel too tight She has been trying to watch the salt in her diet      DM- last A1C 8.7%, she does drink glucerna   she is still on prednisone 30mg  once a day  She is taking metformin 500mg  twice a day    Review Of Systems:  GEN- denies fatigue, fever, weight loss,weakness, recent illness HEENT- denies eye drainage, change in vision, nasal discharge, CVS- denies chest pain, palpitations RESP- denies SOB, cough, wheeze ABD- denies N/V, change in stools, abd pain GU- denies dysuria, hematuria, dribbling, incontinence MSK- + joint pain, muscle aches, injury Neuro- denies headache, dizziness, syncope, seizure activity       Objective:    BP 126/74   Pulse 82   Temp 98.4 F (36.9 C) (Temporal)   Resp 16   Ht 5' 4.5" (1.638 m)   Wt 257 lb (116.6 kg)   SpO2 96%   BMI 43.43 kg/m  GEN- NAD, alert and oriented x3 CVS- RRR, no murmur RESP-CTAB ABD-NABS,soft,NT,ND EXT- 1+ edema with ballooning over top of feet, left heel, no ulceration dry skin Pulses- Radial 2+ DP- diminished        Assessment & Plan:      Problem List Items Addressed This Visit      Unprioritized   Essential hypertension    Controlled, bp is stable on the lasix      Peripheral edema - Primary    She does not want a  second set of unna boots they were too uncomfortable Referral to vascular for evaluation Suspect venous insuffiency as cause She declines compression hose again Agrees to use ACE wraps for compression Increase lasix to 120mg  a day if renal function tolerates for 5 days, then reduce back down        Relevant Orders    Ambulatory referral to Vascular Surgery   Basic metabolic panel (Completed)   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[place])    intermin A1C is much improved, she has also lowered dose of prednisone Continue metformin 500mg  BID          Note: This dictation was prepared with Dragon dictation along with smaller phrase technology. Any transcriptional errors that result from this process are unintentional.

## 2020-09-21 NOTE — Patient Instructions (Addendum)
Use Ace Wrap Take Lasix 2 pills in the morning and 1 in evening for 5 days. Then go back down to twice a day  We will call with lab results

## 2020-09-22 ENCOUNTER — Encounter: Payer: Self-pay | Admitting: Family Medicine

## 2020-09-22 NOTE — Assessment & Plan Note (Signed)
intermin A1C is much improved, she has also lowered dose of prednisone Continue metformin 500mg  BID

## 2020-09-22 NOTE — Assessment & Plan Note (Signed)
Controlled, bp is stable on the lasix

## 2020-09-22 NOTE — Assessment & Plan Note (Signed)
She does not want a  second set of unna boots they were too uncomfortable Referral to vascular for evaluation Suspect venous insuffiency as cause She declines compression hose again Agrees to use ACE wraps for compression Increase lasix to 120mg  a day if renal function tolerates for 5 days, then reduce back down

## 2020-09-23 ENCOUNTER — Encounter (HOSPITAL_COMMUNITY): Payer: Self-pay

## 2020-09-23 ENCOUNTER — Other Ambulatory Visit: Payer: Self-pay

## 2020-09-23 ENCOUNTER — Emergency Department (HOSPITAL_COMMUNITY): Payer: Medicare Other

## 2020-09-23 ENCOUNTER — Emergency Department (HOSPITAL_COMMUNITY)
Admission: EM | Admit: 2020-09-23 | Discharge: 2020-09-23 | Disposition: A | Discharge status: Home / Self Care | Payer: Medicare Other | Attending: Emergency Medicine | Admitting: Emergency Medicine

## 2020-09-23 DIAGNOSIS — E099 Drug or chemical induced diabetes mellitus without complications: Secondary | ICD-10-CM | POA: Diagnosis not present

## 2020-09-23 DIAGNOSIS — J45909 Unspecified asthma, uncomplicated: Secondary | ICD-10-CM | POA: Insufficient documentation

## 2020-09-23 DIAGNOSIS — Z87891 Personal history of nicotine dependence: Secondary | ICD-10-CM | POA: Diagnosis not present

## 2020-09-23 DIAGNOSIS — R600 Localized edema: Secondary | ICD-10-CM | POA: Insufficient documentation

## 2020-09-23 DIAGNOSIS — Z79899 Other long term (current) drug therapy: Secondary | ICD-10-CM | POA: Diagnosis not present

## 2020-09-23 DIAGNOSIS — N289 Disorder of kidney and ureter, unspecified: Secondary | ICD-10-CM | POA: Diagnosis not present

## 2020-09-23 DIAGNOSIS — Z7982 Long term (current) use of aspirin: Secondary | ICD-10-CM | POA: Diagnosis not present

## 2020-09-23 DIAGNOSIS — R609 Edema, unspecified: Secondary | ICD-10-CM

## 2020-09-23 DIAGNOSIS — I872 Venous insufficiency (chronic) (peripheral): Secondary | ICD-10-CM

## 2020-09-23 DIAGNOSIS — I10 Essential (primary) hypertension: Secondary | ICD-10-CM | POA: Diagnosis not present

## 2020-09-23 DIAGNOSIS — Z7984 Long term (current) use of oral hypoglycemic drugs: Secondary | ICD-10-CM | POA: Diagnosis not present

## 2020-09-23 LAB — CBC WITH DIFFERENTIAL/PLATELET
Abs Immature Granulocytes: 0.5 10*3/uL — ABNORMAL HIGH (ref 0.00–0.07)
Basophils Absolute: 0.1 10*3/uL (ref 0.0–0.1)
Basophils Relative: 1 %
Eosinophils Absolute: 0.2 10*3/uL (ref 0.0–0.5)
Eosinophils Relative: 1 %
HCT: 34.2 % — ABNORMAL LOW (ref 36.0–46.0)
Hemoglobin: 10.7 g/dL — ABNORMAL LOW (ref 12.0–15.0)
Immature Granulocytes: 4 %
Lymphocytes Relative: 15 %
Lymphs Abs: 1.8 10*3/uL (ref 0.7–4.0)
MCH: 30.1 pg (ref 26.0–34.0)
MCHC: 31.3 g/dL (ref 30.0–36.0)
MCV: 96.3 fL (ref 80.0–100.0)
Monocytes Absolute: 1.1 10*3/uL — ABNORMAL HIGH (ref 0.1–1.0)
Monocytes Relative: 9 %
Neutro Abs: 8.8 10*3/uL — ABNORMAL HIGH (ref 1.7–7.7)
Neutrophils Relative %: 70 %
Platelets: 310 10*3/uL (ref 150–400)
RBC: 3.55 MIL/uL — ABNORMAL LOW (ref 3.87–5.11)
RDW: 15.9 % — ABNORMAL HIGH (ref 11.5–15.5)
WBC: 12.5 10*3/uL — ABNORMAL HIGH (ref 4.0–10.5)
nRBC: 0 % (ref 0.0–0.2)

## 2020-09-23 LAB — TROPONIN I (HIGH SENSITIVITY): Troponin I (High Sensitivity): 6 ng/L (ref <18)

## 2020-09-23 LAB — BASIC METABOLIC PANEL
Anion gap: 10 (ref 5–15)
BUN: 35 mg/dL — ABNORMAL HIGH (ref 8–23)
CO2: 25 mmol/L (ref 22–32)
Calcium: 8.1 mg/dL — ABNORMAL LOW (ref 8.9–10.3)
Chloride: 98 mmol/L (ref 98–111)
Creatinine, Ser: 1.94 mg/dL — ABNORMAL HIGH (ref 0.44–1.00)
GFR, Estimated: 27 mL/min — ABNORMAL LOW (ref >60)
Glucose, Bld: 101 mg/dL — ABNORMAL HIGH (ref 70–99)
Potassium: 4.3 mmol/L (ref 3.5–5.1)
Sodium: 133 mmol/L — ABNORMAL LOW (ref 135–145)

## 2020-09-23 LAB — BRAIN NATRIURETIC PEPTIDE: B Natriuretic Peptide: 36 pg/mL (ref 0.0–100.0)

## 2020-09-23 MED ORDER — FUROSEMIDE 10 MG/ML IJ SOLN
80.0000 mg | Freq: Once | INTRAMUSCULAR | Status: AC
  Administered 2020-09-23: 80 mg via INTRAVENOUS
  Filled 2020-09-23: qty 8

## 2020-09-23 NOTE — ED Notes (Signed)
Pt does not wish to have dressing placed on her legs or the intact blisters. Supplies given to her to take home for when they do break. Pt understands to no break them or pick at them. Follow up instructions provided to pt in her discharge paperwork.

## 2020-09-23 NOTE — Consult Note (Signed)
Medical Consultation   [name]  GUY:[id number]  DOB: 1951-04-30  DOA: 09/23/2020  PCP: Alycia Rossetti, MD   Outpatient Specialists: Referred to vascular surgery   Requesting physician: Evalee Jefferson PA  Reason for consultation: Lower extremity edema  History of Present Illness: [name] is an 70 y.o. female with a past medical history significant for hypertension, hyperlipidemia, morbid obesity, sleep apnea uses CPAP at nighttime; chronic peripheral edema (which patient state is secondary to venous insufficiency) and  steroid induced type 2 diabetes; who presented to the hospital for evaluation of increasing bilateral lower extremity edema.  Patient is currently on chronic Lasix taking 40 mg twice daily also by after seeing her PCP 3 days ago to increase her Lasix 220 mg daily for a total of 5 days and has noticed no improvement in her swelling.  Patient also reports development of 2 bullous blister on her bilateral feet.  She endorses tightness and discomfort in her legs.  There is no fever, no chills, no nausea, no vomiting, no chest pain, no dysuria, no melena, no hematochezia, no diarrhea, no focal deficits or any other complaints.  Patient also expressed no orthopnea and otherwise no acute distress.    Review of Systems:  As per HPI otherwise 10 point review of systems negative.    Past Medical History: Past Medical History:  Diagnosis Date  . Achilles tendinitis   . Anxiety   . Asthma   . Depression   . GERD (gastroesophageal reflux disease)   . Gout   . H/O myasthenia gravis    left eye  . HTN (hypertension)   . Hyperlipidemia   . Low back pain   . Obesity hypoventilation syndrome ([place])   . Obstructive sleep apnea   . Osteoarthritis     Past Surgical History: Past Surgical History:  Procedure Laterality Date  . CATARACT EXTRACTION W/PHACO Left 09/17/2015   Procedure: CATARACT EXTRACTION PHACO AND INTRAOCULAR LENS PLACEMENT LEFT EYE  cde=8.58;  Surgeon: Tonny Branch, MD;  Location: [facility];  Service: Ophthalmology;  Laterality: Left;  . CATARACT EXTRACTION W/PHACO Right 05/15/2017   Procedure: CATARACT EXTRACTION PHACO AND INTRAOCULAR LENS PLACEMENT (IOC);  Surgeon: Tonny Branch, MD;  Location: [facility];  Service: Ophthalmology;  Laterality: Right;  CDE: 6.34  . COLONOSCOPY N/A 12/25/2013   Procedure: COLONOSCOPY;  Surgeon: Rogene Houston, MD;  Location: [facility];  Service: Endoscopy;  Laterality: N/A;  730-rescheduled Ann notified pt  . COLONOSCOPY WITH PROPOFOL N/A 10/04/2019   Procedure: COLONOSCOPY WITH PROPOFOL;  Surgeon: Rogene Houston, MD;  Location: [facility];  Service: Endoscopy;  Laterality: N/A;  815  . CYST EXCISION N/A 09/12/2016   Procedure: EXCISION SEBACEOUS CYST, BACK;  Surgeon: Aviva Signs, MD;  Location: [facility];  Service: General;  Laterality: N/A;  . INTRAOCULAR LENS INSERTION Bilateral 2018   Dr. Tonny Branch   . POLYPECTOMY  10/04/2019   Procedure: POLYPECTOMY;  Surgeon: Rogene Houston, MD;  Location: [facility];  Service: Endoscopy;;  . TUBAL LIGATION       Allergies:  No Known Allergies   Social History:  reports that she quit smoking about 15 years ago. Her smoking use included cigarettes. She started smoking about 51 years ago. She has a 72.00 pack-year smoking history. She has never used smokeless tobacco. She reports that she does not drink alcohol and does not use drugs.  Family History: Family History  Problem Relation Age of Onset  . Heart disease Mother   . Thyroid disease Mother   . Heart failure Father   . Lung disease Father   . Diabetes Brother   . Crohn's disease Daughter    Physical Exam: Vitals:   09/23/20 1315 09/23/20 1330 09/23/20 1345 09/23/20 1400  BP:  131/60  (!) 124/96  Pulse: (!) 103 (!) 103 100   Resp: 18 16 15 15   Temp:      TempSrc:      SpO2: 97% 96% 100%   Weight:      Height:        Constitutional: Chronically ill in appearance; alert and  awake, oriented x3.  In no acute distress. Eyes: PERLA, EOMI, irises appear normal, anicteric sclera,  ENMT: external ears and nose appear normal, normal hearing. Lips appears normal, oropharynx mucosa, tongue, posterior pharynx appear normal  Neck: neck appears normal, no masses, normal ROM, no thyromegaly, no JVD  CVS: S1-S2 clear, no murmur rubs or gallops, no LE edema, normal pedal pulses  Respiratory:  clear to auscultation bilaterally, no wheezing, rales or rhonchi. Respiratory effort normal. No accessory muscle use.  Good saturation on room air. Abdomen: soft nontender, nondistended, normal bowel sounds, no hepatosplenomegaly, no hernias  Musculoskeletal: : no cyanosis or clubbing; patient with 2-3+ edema appreciated bilaterally secondary to venous insufficiency and third space shifting from obesity lymphedema. Neuro: Cranial nerves II-XII intact, strength, sensation, reflexes; no focal deficit. Psych: judgement and insight appear normal, stable mood and affect, mental status Skin: Couple isolated intact blisters on both legs appreciated; chronic stasis dermatitis changes visualized.  No signs of purulent discharge, erythema or other signs of acute infection.   Data reviewed:  I have personally reviewed following labs and imaging studies Labs:  CBC: Recent Labs  Lab 09/23/20 1118  WBC 12.5*  NEUTROABS 8.8*  HGB 10.7*  HCT 34.2*  MCV 96.3  PLT 643    Basic Metabolic Panel: Recent Labs  Lab 09/21/20 1138 09/23/20 1118  NA 138 133*  K 4.3 4.3  CL 100 98  CO2 27 25  GLUCOSE 89 101*  BUN 28* 35*  CREATININE 1.57* 1.94*  CALCIUM 7.8* 8.1*   GFR Estimated Creatinine Clearance: 33.7 mL/min (A) (by C-G formula based on SCr of 1.94 mg/dL (H)).  Urinalysis    Component Value Date/Time   COLORURINE YELLOW 12/26/2017 2015   APPEARANCEUR CLEAR 12/26/2017 2015   LABSPEC 1.014 12/26/2017 2015   PHURINE 5.0 12/26/2017 2015   GLUCOSEU NEGATIVE 12/26/2017 2015   HGBUR NEGATIVE  12/26/2017 2015   BILIRUBINUR NEGATIVE 12/26/2017 2015   [place] NEGATIVE 12/26/2017 2015   PROTEINUR NEGATIVE 12/26/2017 2015   NITRITE NEGATIVE 12/26/2017 2015   LEUKOCYTESUR SMALL (A) 12/26/2017 2015    Radiological Exams on Admission: DG Chest Portable 1 View  Result Date: 09/23/2020 CLINICAL DATA:  Increased peripheral edema EXAM: PORTABLE CHEST 1 VIEW COMPARISON:  07/11/2020 FINDINGS: Heart is borderline in size. Lingular scarring or atelectasis at the left base. Right lung clear. No effusions or edema. No acute bony abnormality. IMPRESSION: Lingular scarring or atelectasis.  No active disease. Electronically Signed   By: Rolm Baptise M.D.   On: 09/23/2020 10:41    Impression/Recommendations 1-bilateral lower extremity edema -Chronic and in the setting of venous insufficiency, obesity lymphedema and third space shifting. -Outpatient referral for vascular service evaluation already in place and pending. -Will also recommend outpatient visits to lymphedema clinic. -Patient instructed to  follow low-sodium diet and to maintain leg elevation as much as possible. -Resume 40 mg oral Lasix on daily basis.  2-chronic kidney disease a stage III b -Close to baseline -Slightly elevation in her creatinine in the setting of recent increased dose of Lasix -Advised to maintain adequate hydration -Lasix dose has been adjusted. -Follow-up with PCP with instructions to repeat basic metabolic panel to follow renal function trend.  3-morbid obesity -Body mass index is 43.43 kg/m. -Low calorie diet, portion control and increase physical activity recommended.  4-hyperlipidemia -Continue statins.  5-essential hypertension -Continue home antihypertensive regimen. -Low-sodium diet recommended.  6-type 2 diabetes mellitus with nephropathy -Continue current hypoglycemic regimen -Modified carbohydrate diet recommended.  7-depression/anxiety -No suicidal ideation or hallucination -Continue  the use of BuSpar and trazodone.   Thank you for this consultation.  At this moment based on patient's physical examination, history and ongoing presentation of symptoms she do not meet criteria for inpatient management.  Patient will be best served a lymphedema clinic to further assist with ongoing peripheral edema of her lower extremity using Unna boots, compression wrapping or proper use of stocking socks.  Low-sodium diet, resumption of previously daily dose of Lasix (40 mg by mouth daily) and leg elevation.  There was no significant findings suggesting acute infection or cellulitis and would not recommend antibiotics at this moment.  Time Spent: 40 minutes.  Barton Dubois M.D. [facility] 09/23/2020, 3:58 PM

## 2020-09-23 NOTE — ED Triage Notes (Signed)
Presents this morning for concerns over large blisters to bilateral feet and a large amount of swelling

## 2020-09-23 NOTE — Discharge Instructions (Addendum)
Elevate your legs as much as possible to help reduce the swelling.  Keeping them low (on the floor) or dependent will only make the swelling worse.  We recommend you taking your regular lasix dose of 40 mg daily.  You are being referred to our local physical therapy department for further assistance with your skin changes and blisters which have developed from the swelling.  They will be calling you to schedule your first appointment.  Ideally, you should continue to be treated with Unna boots once the blisters have improved. This will help your skin heal.   We recommend being careful to avoid excess salt consumption which will always make swelling worse.    Call Dr. Buelah Manis also for a recheck of your kidney function (blood work) within the next week.

## 2020-09-23 NOTE — ED Provider Notes (Signed)
[facility] EMERGENCY DEPARTMENT Provider Note   CSN: [id number] Arrival date & time: 09/23/20  3419     History Chief Complaint  Patient presents with  . Blister    Bilateral feet     [name] is a 70 y.o. female with a history of hypertension, hyperlipidemia, obesity, sleep apnea who uses CPAP at night, chronic peripheral edema which patient states is secondary to venous insufficiency and steroid induced type 2 diabetes presenting for evaluation of increasing bilateral lower extremity edema.  She is currently on chronic Lasix taking 40 mg twice daily, was advised when seen her PCP 3 days ago to increase her Lasix to 120 mg daily for 5 days secondary to increasing edema which she has done.  However, woke this morning with increased swelling and has developed bullous on her bilateral feet.  She endorses tightness and discomfort, right foot greater than the left.  She denies increased shortness of breath.  No chest pain, no fevers or chills.  She denies orthopnea, but also states she sleeps propped up, not secondary to shortness of breath but to better accommodate her CPAP machine.  HPI     Past Medical History:  Diagnosis Date  . Achilles tendinitis   . Anxiety   . Asthma   . Depression   . GERD (gastroesophageal reflux disease)   . Gout   . H/O myasthenia gravis    left eye  . HTN (hypertension)   . Hyperlipidemia   . Low back pain   . Obesity hypoventilation syndrome ([place])   . Obstructive sleep apnea   . Osteoarthritis     Patient Active Problem List   Diagnosis Date Noted  . 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[place]) 05/12/2020  . Chronic insomnia 01/08/2020  . Ocular myasthenia gravis ([place]) 09/18/2019  . Peripheral edema 01/23/2019  . Asthma 05/14/2015  . Heel spur 02/24/2015  . Depression 02/24/2015  . Post-menopausal bleeding 06/02/2014  . OA (osteoarthritis) of knee 04/09/2014  . Epidermal cyst 10/01/2013  . Class 3 obesity 02/25/2013  . Back pain  03/04/2012  . Gout 01/25/2012  . Edema 01/28/2011  . OBESITY HYPOVENTILATION SYNDROME 06/16/2008  . OBSTRUCTIVE SLEEP APNEA 05/08/2008  . Hyperlipidemia 08/28/2006  . Anxiety 08/28/2006  . Essential hypertension 08/28/2006  . GERD 08/28/2006  . Osteoarthritis 08/28/2006    Past Surgical History:  Procedure Laterality Date  . CATARACT EXTRACTION W/PHACO Left 09/17/2015   Procedure: CATARACT EXTRACTION PHACO AND INTRAOCULAR LENS PLACEMENT LEFT EYE cde=8.58;  Surgeon: Tonny Branch, MD;  Location: [facility];  Service: Ophthalmology;  Laterality: Left;  . CATARACT EXTRACTION W/PHACO Right 05/15/2017   Procedure: CATARACT EXTRACTION PHACO AND INTRAOCULAR LENS PLACEMENT (IOC);  Surgeon: Tonny Branch, MD;  Location: [facility];  Service: Ophthalmology;  Laterality: Right;  CDE: 6.34  . COLONOSCOPY N/A 12/25/2013   Procedure: COLONOSCOPY;  Surgeon: Rogene Houston, MD;  [facility];  Service: Endoscopy;  Laterality: N/A;  730-rescheduled Ann notified pt  . COLONOSCOPY WITH PROPOFOL N/A 10/04/2019   Procedure: COLONOSCOPY WITH PROPOFOL;  Surgeon: Rogene Houston, MD;  [facility];  Service: Endoscopy;  Laterality: N/A;  815  . CYST EXCISION N/A 09/12/2016   Procedure: EXCISION SEBACEOUS CYST, BACK;  Surgeon: Aviva Signs, MD;  Location: [facility];  Service: General;  Laterality: N/A;  . INTRAOCULAR LENS INSERTION Bilateral 2018   Dr. Tonny Branch   . POLYPECTOMY  10/04/2019   Procedure: POLYPECTOMY;  Surgeon: Rogene Houston, MD;  [facility];  Service: Endoscopy;;  . TUBAL LIGATION       OB History    Gravida  3   Para  3   Term  3   Preterm      AB      Living  3     SAB      IAB      Ectopic      Multiple      Live Births              Family History  Problem Relation Age of Onset  . Heart disease Mother   . Thyroid disease Mother   . Heart failure Father   . Lung disease Father   . Diabetes Brother   . Crohn's disease Daughter     Social  History   Tobacco Use  . Smoking status: Former Smoker    Packs/day: 2.00    Years: 36.00    Pack years: 72.00    Types: Cigarettes    Start date: 06/25/1969    Quit date: 03/25/2005    Years since quitting: 15.5  . Smokeless tobacco: Never Used  Vaping Use  . Vaping Use: Never used  Substance Use Topics  . Alcohol use: No    Alcohol/week: 0.0 standard drinks  . Drug use: No    Home Medications Prior to Admission medications   Medication Sig Start Date End Date Taking? Authorizing Provider  acetaminophen (TYLENOL) 500 MG tablet Take 1,000 mg by mouth every 6 (six) hours as needed for mild pain.    Yes [provider]  albuterol (VENTOLIN HFA) 108 (90 Base) MCG/ACT inhaler INHALE 2 PUFFS INTO THE LUNGS EVERY 4 HOURS AS NEEDED FOR WHEEZING OR SHORTNESS OF BREATH Patient taking differently: Inhale 2 puffs into the lungs every 4 (four) hours as needed for wheezing or shortness of breath. 01/23/20  Yes [place], Modena Nunnery, MD  allopurinol (ZYLOPRIM) 100 MG tablet TAKE 1 TABLET(100 MG) BY MOUTH DAILY Patient taking differently: Take 100 mg by mouth daily. 02/28/20  Yes [place], Modena Nunnery, MD  aspirin EC 81 MG tablet Take 1 tablet (81 mg total) by mouth every evening. 10/05/19  Yes Rehman, Mechele Dawley, MD  atorvastatin (LIPITOR) 40 MG tablet TAKE 1 TABLET(40 MG) BY MOUTH DAILY AT 6 PM Patient taking differently: Take 40 mg by mouth daily. 08/31/20  Yes [place], Modena Nunnery, MD  Blood Glucose Monitoring Suppl (BLOOD GLUCOSE SYSTEM PAK) KIT Use as directed to monitor FSBS 3x weekly. Dx: R73.09. 05/25/20  Yes [place], Modena Nunnery, MD  busPIRone (BUSPAR) 5 MG tablet Take 1 tablet (5 mg total) by mouth 2 (two) times daily. For anxiety 08/12/20  Yes [place], Modena Nunnery, MD  clotrimazole (LOTRIMIN) 1 % cream Apply 1 application topically 2 (two) times daily. 01/08/20  Yes [place], Modena Nunnery, MD  diphenhydrAMINE (BENADRYL) 25 MG tablet Take 25 mg by mouth daily.   Yes [provider]  furosemide (LASIX) 40  MG tablet TAKE 1 TABLET(40 MG) BY MOUTH DAILY Patient taking differently: Take 40 mg by mouth daily. 09/08/20  Yes [place], Modena Nunnery, MD  Glucose Blood (BLOOD GLUCOSE TEST STRIPS) STRP Use as directed to monitor FSBS 3x weekly. Dx: R73.09. 05/25/20  Yes [place], Modena Nunnery, MD  HYDROcodone-acetaminophen (NORCO) 7.5-325 MG tablet Take 1 tablet by mouth 2 (two) times daily as needed for moderate pain. 09/04/20  Yes [place], Modena Nunnery, MD  losartan (COZAAR) 50 MG tablet TAKE 1 TABLET BY MOUTH DAILY Patient  taking differently: Take 50 mg by mouth daily. 08/31/20  Yes [place], Modena Nunnery, MD  Magnesium 250 MG TABS Take 1 tablet (250 mg total) by mouth daily. Patient taking differently: Take 250 mg by mouth daily in the afternoon. Midday 08/16/18  Yes [place], Modena Nunnery, MD  metFORMIN (GLUCOPHAGE) 500 MG tablet Take 1 tablet (500 mg total) by mouth 2 (two) times daily with a meal. 08/12/20  Yes [place], Modena Nunnery, MD  nystatin (MYCOSTATIN/NYSTOP) powder Apply 1 application topically 3 (three) times daily. 01/08/20  Yes [place], Modena Nunnery, MD  omeprazole (PRILOSEC) 40 MG capsule TAKE 1 CAPSULE(40 MG) BY MOUTH DAILY Patient taking differently: Take 40 mg by mouth daily. 06/29/20  Yes [place], Modena Nunnery, MD  OXYGEN Inhale 1 L into the lungs at bedtime. IN ADDITION TO CPAP NIGHTLY   Yes [provider]  potassium chloride (KLOR-CON) 10 MEQ tablet TAKE 1 TABLET BY MOUTH DAILY WITH FLUID PILL Patient taking differently: Take 10 mEq by mouth daily. With fluid pill 09/01/20  Yes [place], Modena Nunnery, MD  predniSONE (DELTASONE) 10 MG tablet Pt currently on 6m daily, start taking 522mdaily for one month, then decrease to 4017maily for 1 month, then decrease 13m27mily. Patient taking differently: Take 30 mg by mouth daily. 07/16/20  Yes [place], RichNanine Means  pyridostigmine (MESTINON) 180 MG CR tablet Take 180 mg by mouth daily. 06/27/20  Yes [provider]  pyridostigmine (MESTINON) 60 MG tablet Take 1 tablet (60  mg total) by mouth 3 (three) times daily. 04/01/20  Yes [place], VikrEarlean Polka  traZODone (DESYREL) 100 MG tablet TAKE 1 TABLET BY MOUTH AT BEDTIME AS NEEDED FOR SLEEP Patient taking differently: Take 100 mg by mouth at bedtime as needed for sleep. 07/07/20  Yes [place], KawaModena Nunnery  fluticasone (CUTIVATE) 0.05 % cream APPLY EXTERNALLY TO THE AFFECTED AREA DAILY Patient not taking: No sig reported 08/13/18   DurhAlycia Rossetti  Lancets MISC Use as directed to monitor FSBS 3x weekly. Dx: R73.09. 05/25/20   DurhAlycia Rossetti    Allergies    Patient has no known allergies.  Review of Systems   Review of Systems  Constitutional: Negative for chills and fever.  HENT: Negative for congestion and sore throat.   Eyes: Negative.   Respiratory: Negative for cough, chest tightness and shortness of breath.   Cardiovascular: Positive for leg swelling. Negative for chest pain.  Gastrointestinal: Negative for abdominal pain and nausea.  Genitourinary: Negative.   Musculoskeletal: Negative for arthralgias, joint swelling and neck pain.  Skin: Positive for color change and wound. Negative for rash.  Neurological: Negative for dizziness, weakness, light-headedness, numbness and headaches.  Psychiatric/Behavioral: Negative.   All other systems reviewed and are negative.   Physical Exam Updated Vital Signs BP 133/64   Pulse 93   Temp 98.4 F (36.9 C) (Oral)   Resp 17   Ht 5' 4.5" (1.638 m)   Wt 116.6 kg   SpO2 97%   BMI 43.43 kg/m   Physical Exam Vitals and nursing note reviewed.  Constitutional:      Appearance: She is well-developed and well-nourished.  HENT:     Head: Normocephalic and atraumatic.  Eyes:     Conjunctiva/sclera: Conjunctivae normal.  Cardiovascular:     Rate and Rhythm: Normal rate and regular rhythm.     Pulses: Intact distal pulses.     Heart sounds: Normal heart sounds.  Pulmonary:     Effort: Pulmonary  effort is normal. No respiratory distress.     Breath  sounds: Normal breath sounds. No wheezing or rales.  Abdominal:     General: Bowel sounds are normal.     Palpations: Abdomen is soft.     Tenderness: There is no abdominal tenderness.  Musculoskeletal:        General: Tenderness present. Normal range of motion.     Cervical back: Normal range of motion.     Right lower leg: Edema present.     Left lower leg: Edema present.     Comments: Bilateral lower extremity edema, 3+ feet, trace to lateral lower thighs.  Subtle erythema right lower leg.  Several intact bulla.  Burst bulla right lateral foot. Feet are warm, difficult to palpate pulses secondary to edema.  Skin:    General: Skin is warm and dry.  Neurological:     Mental Status: She is alert.  Psychiatric:        Mood and Affect: Mood and affect normal.         ED Results / Procedures / Treatments   Labs (all labs ordered are listed, but only abnormal results are displayed) Labs Reviewed  CBC WITH DIFFERENTIAL/PLATELET - Abnormal; Notable for the following components:      Result Value   WBC 12.5 (*)    RBC 3.55 (*)    Hemoglobin 10.7 (*)    HCT 34.2 (*)    RDW 15.9 (*)    Neutro Abs 8.8 (*)    Monocytes Absolute 1.1 (*)    Abs Immature Granulocytes 0.50 (*)    All other components within normal limits  BASIC METABOLIC PANEL - Abnormal; Notable for the following components:   Sodium 133 (*)    Glucose, Bld 101 (*)    BUN 35 (*)    Creatinine, Ser 1.94 (*)    Calcium 8.1 (*)    GFR, Estimated 27 (*)    All other components within normal limits  BRAIN NATRIURETIC PEPTIDE  TROPONIN I (HIGH SENSITIVITY)    EKG EKG Interpretation  Date/Time:  Wednesday September 23 2020 10:31:53 EST Ventricular Rate:  96 PR Interval:    QRS Duration: 93 QT Interval:  335 QTC Calculation: 424 R Axis:   30 Text Interpretation: Sinus rhythm Low voltage, precordial leads Borderline repolarization abnormality Poor data quality Confirmed by Aletta Edouard [phone]) on 09/23/2020  10:48:34 AM   Radiology DG Chest Portable 1 View  Result Date: 09/23/2020 CLINICAL DATA:  Increased peripheral edema EXAM: PORTABLE CHEST 1 VIEW COMPARISON:  07/11/2020 FINDINGS: Heart is borderline in size. Lingular scarring or atelectasis at the left base. Right lung clear. No effusions or edema. No acute bony abnormality. IMPRESSION: Lingular scarring or atelectasis.  No active disease. Electronically Signed   By: Rolm Baptise M.D.   On: 09/23/2020 10:41    Procedures Procedures   Medications Ordered in ED Medications  furosemide (LASIX) injection 80 mg (has no administration in time range)    ED Course  I have reviewed the triage vital signs and the nursing notes.  Pertinent labs & imaging results that were available during my care of the patient were reviewed by me and considered in my medical decision making (see chart for details).  Clinical Course as of 09/23/20 1231  Wed Sep 24, 7518  619 70 year old female with history of peripheral edema here with increased swelling and now blistering on her legs.  Her PCP has been increasing her oral diuretics without improvement.  Getting  some screening labs but likely will need to come in for more aggressive diuresis. [MB]    Clinical Course User Index [MB] Hayden Rasmussen, MD   MDM Rules/Calculators/A&P                          Pt with significantly worsening peripheral edema, now with bulla formation. Right leg slightly more erythematous, suspect due to edema and changes c/w venous insufficiency, would need to consider cellulitis also of the right extremity.  WBC count elevated at 12.5.  Her creatinine is 1.94 today, up from 1.57 several days ago, given IV lasix here, but will need to follow creatinine closely.  Pt will need admission for further diuresis.  BNP and cxr normal, no evidence for CHF as source of edema.     Spoke with Dr. Dyann Kief with [facility].  Per review and consult with pt, he suggests that Lasix will only  dehydrate pt and make renal function worse and not improve edema since this is a vascular problem, not fluid overload.  Would not recommend admission as pt needs extremity elevation,  outpatient wound care/Unna boots (but pt declined Unna boots at pcp's office visit on 2/28 although she wore them from 2/23 - 2/28), states too uncomfortable.   She is also being referred to vascular specialists per pcp's note.  Will refer locally for additional wound care with pt - outpatient referral given.    Final Clinical Impression(s) / ED Diagnoses Final diagnoses:  Peripheral edema  Renal insufficiency    Rx / DC Orders ED Discharge Orders    None       Landis Martins 09/24/20 1135    Hayden Rasmussen, MD 09/24/20 2047

## 2020-09-24 ENCOUNTER — Emergency Department (HOSPITAL_COMMUNITY)
Admission: EM | Admit: 2020-09-24 | Discharge: 2020-09-25 | Disposition: A | Discharge status: Home / Self Care | Payer: Medicare Other | Attending: Emergency Medicine | Admitting: Emergency Medicine

## 2020-09-24 ENCOUNTER — Other Ambulatory Visit: Payer: Self-pay

## 2020-09-24 ENCOUNTER — Encounter (HOSPITAL_COMMUNITY): Payer: Self-pay | Admitting: Emergency Medicine

## 2020-09-24 DIAGNOSIS — Z7982 Long term (current) use of aspirin: Secondary | ICD-10-CM | POA: Insufficient documentation

## 2020-09-24 DIAGNOSIS — Z87891 Personal history of nicotine dependence: Secondary | ICD-10-CM | POA: Diagnosis not present

## 2020-09-24 DIAGNOSIS — S80821A Blister (nonthermal), right lower leg, initial encounter: Secondary | ICD-10-CM | POA: Insufficient documentation

## 2020-09-24 DIAGNOSIS — J45909 Unspecified asthma, uncomplicated: Secondary | ICD-10-CM | POA: Diagnosis not present

## 2020-09-24 DIAGNOSIS — S90521A Blister (nonthermal), right ankle, initial encounter: Secondary | ICD-10-CM | POA: Diagnosis not present

## 2020-09-24 DIAGNOSIS — S8991XA Unspecified injury of right lower leg, initial encounter: Secondary | ICD-10-CM | POA: Diagnosis present

## 2020-09-24 DIAGNOSIS — S80822A Blister (nonthermal), left lower leg, initial encounter: Secondary | ICD-10-CM | POA: Insufficient documentation

## 2020-09-24 DIAGNOSIS — Z7984 Long term (current) use of oral hypoglycemic drugs: Secondary | ICD-10-CM | POA: Insufficient documentation

## 2020-09-24 DIAGNOSIS — I872 Venous insufficiency (chronic) (peripheral): Secondary | ICD-10-CM | POA: Diagnosis not present

## 2020-09-24 DIAGNOSIS — X58XXXA Exposure to other specified factors, initial encounter: Secondary | ICD-10-CM | POA: Diagnosis not present

## 2020-09-24 DIAGNOSIS — Z79899 Other long term (current) drug therapy: Secondary | ICD-10-CM | POA: Diagnosis not present

## 2020-09-24 DIAGNOSIS — R600 Localized edema: Secondary | ICD-10-CM | POA: Diagnosis not present

## 2020-09-24 DIAGNOSIS — Z20822 Contact with and (suspected) exposure to covid-19: Secondary | ICD-10-CM | POA: Diagnosis not present

## 2020-09-24 DIAGNOSIS — I10 Essential (primary) hypertension: Secondary | ICD-10-CM | POA: Diagnosis not present

## 2020-09-24 DIAGNOSIS — R609 Edema, unspecified: Secondary | ICD-10-CM | POA: Diagnosis not present

## 2020-09-24 DIAGNOSIS — E119 Type 2 diabetes mellitus without complications: Secondary | ICD-10-CM | POA: Insufficient documentation

## 2020-09-24 LAB — CBC WITH DIFFERENTIAL/PLATELET
Abs Immature Granulocytes: 0.35 10*3/uL — ABNORMAL HIGH (ref 0.00–0.07)
Basophils Absolute: 0.1 10*3/uL (ref 0.0–0.1)
Basophils Relative: 1 %
Eosinophils Absolute: 0.1 10*3/uL (ref 0.0–0.5)
Eosinophils Relative: 1 %
HCT: 31 % — ABNORMAL LOW (ref 36.0–46.0)
Hemoglobin: 9.9 g/dL — ABNORMAL LOW (ref 12.0–15.0)
Immature Granulocytes: 3 %
Lymphocytes Relative: 15 %
Lymphs Abs: 1.8 10*3/uL (ref 0.7–4.0)
MCH: 30 pg (ref 26.0–34.0)
MCHC: 31.9 g/dL (ref 30.0–36.0)
MCV: 93.9 fL (ref 80.0–100.0)
Monocytes Absolute: 0.9 10*3/uL (ref 0.1–1.0)
Monocytes Relative: 8 %
Neutro Abs: 8.7 10*3/uL — ABNORMAL HIGH (ref 1.7–7.7)
Neutrophils Relative %: 72 %
Platelets: 340 10*3/uL (ref 150–400)
RBC: 3.3 MIL/uL — ABNORMAL LOW (ref 3.87–5.11)
RDW: 15.6 % — ABNORMAL HIGH (ref 11.5–15.5)
WBC: 12 10*3/uL — ABNORMAL HIGH (ref 4.0–10.5)
nRBC: 0.3 % — ABNORMAL HIGH (ref 0.0–0.2)

## 2020-09-24 LAB — COMPREHENSIVE METABOLIC PANEL
ALT: 17 U/L (ref 0–44)
AST: 15 U/L (ref 15–41)
Albumin: 2.8 g/dL — ABNORMAL LOW (ref 3.5–5.0)
Alkaline Phosphatase: 103 U/L (ref 38–126)
Anion gap: 10 (ref 5–15)
BUN: 38 mg/dL — ABNORMAL HIGH (ref 8–23)
CO2: 23 mmol/L (ref 22–32)
Calcium: 8.2 mg/dL — ABNORMAL LOW (ref 8.9–10.3)
Chloride: 100 mmol/L (ref 98–111)
Creatinine, Ser: 1.84 mg/dL — ABNORMAL HIGH (ref 0.44–1.00)
GFR, Estimated: 29 mL/min — ABNORMAL LOW (ref >60)
Glucose, Bld: 108 mg/dL — ABNORMAL HIGH (ref 70–99)
Potassium: 4.1 mmol/L (ref 3.5–5.1)
Sodium: 133 mmol/L — ABNORMAL LOW (ref 135–145)
Total Bilirubin: 0.6 mg/dL (ref 0.3–1.2)
Total Protein: 6.2 g/dL — ABNORMAL LOW (ref 6.5–8.1)

## 2020-09-24 NOTE — Discharge Instructions (Addendum)
It is important that you elevate your legs as much as possible.  You have been given a referral to the local outpatient wound clinic.  Someone from the department should contact you to arrange a follow-up appointment.  Be sure to keep your appointment with your vascular provider.

## 2020-09-24 NOTE — ED Provider Notes (Signed)
[facility] EMERGENCY DEPARTMENT Provider Note   CSN: [id number] Arrival date & time: 09/24/20  1926     History Chief Complaint  Patient presents with  . Leg Swelling    [name] is a 70 y.o. female.  HPI      [name] is a 70 y.o. female with past medical history of asthma, hypertension, and steroid induced diabetes.  Returns to the Emergency Department this evening complaining of increasing pain and swelling of both lower legs and feet.  She was seen here yesterday for similar symptoms.  Yesterday she had several intact blisters to both lower legs.  She states today the blisters have "popped" and her pain has worsened.  States she is having difficulty walking or standing due to pain.  Takes 40 mg Lasix daily, denies any missed doses.  She denies chest pain, increasing shortness of breath fever or chills.  Patient lives at home alone and states she has been unable to care for herself. She wore Unna boots in the past, but does not want to wear them again stating that "they made my legs worse."    Past Medical History:  Diagnosis Date  . Achilles tendinitis   . Anxiety   . Asthma   . Depression   . GERD (gastroesophageal reflux disease)   . Gout   . H/O myasthenia gravis    left eye  . HTN (hypertension)   . Hyperlipidemia   . Low back pain   . Obesity hypoventilation syndrome ([place])   . Obstructive sleep apnea   . Osteoarthritis     Patient Active Problem List   Diagnosis Date Noted  . 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[place]) 05/12/2020  . Chronic insomnia 01/08/2020  . Ocular myasthenia gravis ([place]) 09/18/2019  . Peripheral edema 01/23/2019  . Asthma 05/14/2015  . Heel spur 02/24/2015  . Depression 02/24/2015  . Post-menopausal bleeding 06/02/2014  . OA (osteoarthritis) of knee 04/09/2014  . Epidermal cyst 10/01/2013  . Class 3 obesity 02/25/2013  . Back pain 03/04/2012  . Gout 01/25/2012  . Edema 01/28/2011  . OBESITY HYPOVENTILATION  SYNDROME 06/16/2008  . OBSTRUCTIVE SLEEP APNEA 05/08/2008  . Hyperlipidemia 08/28/2006  . Anxiety 08/28/2006  . Essential hypertension 08/28/2006  . GERD 08/28/2006  . Osteoarthritis 08/28/2006    Past Surgical History:  Procedure Laterality Date  . CATARACT EXTRACTION W/PHACO Left 09/17/2015   Procedure: CATARACT EXTRACTION PHACO AND INTRAOCULAR LENS PLACEMENT LEFT EYE cde=8.58;  Surgeon: Tonny Branch, MD;  Location: [facility];  Service: Ophthalmology;  Laterality: Left;  . CATARACT EXTRACTION W/PHACO Right 05/15/2017   Procedure: CATARACT EXTRACTION PHACO AND INTRAOCULAR LENS PLACEMENT (IOC);  Surgeon: Tonny Branch, MD;  Location: [facility];  Service: Ophthalmology;  Laterality: Right;  CDE: 6.34  . COLONOSCOPY N/A 12/25/2013   Procedure: COLONOSCOPY;  Surgeon: Rogene Houston, MD;  Location: [facility] END[facility] SUITE;  Service: Endoscopy;  Laterality: N/A;  730-rescheduled Ann notified pt  . COLONOSCOPY WITH PROPOFOL N/A 10/04/2019   Procedure: COLONOSCOPY WITH PROPOFOL;  Surgeon: Rogene Houston, MD;  Location: [facility] SU[facility];  Service: Endoscopy;  Laterality: N/A;  815  . CYST EXCISION N/A 09/12/2016   Procedure: EXCISION SEBACEOUS CYST, BACK;  Surgeon: Aviva Signs, MD;  Location: [facility];  Service: General;  Laterality: N/A;  . INTRAOCULAR LENS INSERTION Bilateral 2018   Dr. Tonny Branch   . POLYPECTOMY  10/04/2019   Procedure: POLYPECTOMY;  Surgeon: Rogene Houston, MD;  Location: [facility];  Service: Endoscopy;;  .  TUBAL LIGATION       OB History    Gravida  3   Para  3   Term  3   Preterm      AB      Living  3     SAB      IAB      Ectopic      Multiple      Live Births              Family History  Problem Relation Age of Onset  . Heart disease Mother   . Thyroid disease Mother   . Heart failure Father   . Lung disease Father   . Diabetes Brother   . Crohn's disease Daughter     Social History   Tobacco Use  . Smoking status: Former Smoker    Packs/day: 2.00     Years: 36.00    Pack years: 72.00    Types: Cigarettes    Start date: 06/25/1969    Quit date: 03/25/2005    Years since quitting: 15.5  . Smokeless tobacco: Never Used  Vaping Use  . Vaping Use: Never used  Substance Use Topics  . Alcohol use: No    Alcohol/week: 0.0 standard drinks  . Drug use: No    Home Medications Prior to Admission medications   Medication Sig Start Date End Date Taking? Authorizing Provider  acetaminophen (TYLENOL) 500 MG tablet Take 1,000 mg by mouth every 6 (six) hours as needed for mild pain.     [provider]  albuterol (VENTOLIN HFA) 108 (90 Base) MCG/ACT inhaler INHALE 2 PUFFS INTO THE LUNGS EVERY 4 HOURS AS NEEDED FOR WHEEZING OR SHORTNESS OF BREATH Patient taking differently: Inhale 2 puffs into the lungs every 4 (four) hours as needed for wheezing or shortness of breath. 01/23/20   [place], Velna Hatchet, MD  allopurinol (ZYLOPRIM) 100 MG tablet TAKE 1 TABLET(100 MG) BY MOUTH DAILY Patient taking differently: Take 100 mg by mouth daily. 02/28/20   Salley Scarlet, MD  aspirin EC 81 MG tablet Take 1 tablet (81 mg total) by mouth every evening. 10/05/19   Rehman, Joline Maxcy, MD  atorvastatin (LIPITOR) 40 MG tablet TAKE 1 TABLET(40 MG) BY MOUTH DAILY AT 6 PM Patient taking differently: Take 40 mg by mouth daily. 08/31/20   Salley Scarlet, MD  Blood Glucose Monitoring Suppl (BLOOD GLUCOSE SYSTEM PAK) KIT Use as directed to monitor FSBS 3x weekly. Dx: R73.09. 05/25/20   Salley Scarlet, MD  busPIRone (BUSPAR) 5 MG tablet Take 1 tablet (5 mg total) by mouth 2 (two) times daily. For anxiety 08/12/20   Salley Scarlet, MD  clotrimazole (LOTRIMIN) 1 % cream Apply 1 application topically 2 (two) times daily. 01/08/20   Salley Scarlet, MD  diphenhydrAMINE (BENADRYL) 25 MG tablet Take 25 mg by mouth daily.    [provider]  fluticasone (CUTIVATE) 0.05 % cream APPLY EXTERNALLY TO THE AFFECTED AREA DAILY Patient not taking: No sig reported 08/13/18    Salley Scarlet, MD  furosemide (LASIX) 40 MG tablet TAKE 1 TABLET(40 MG) BY MOUTH DAILY Patient taking differently: Take 40 mg by mouth daily. 09/08/20   [place], Velna Hatchet, MD  Glucose Blood (BLOOD GLUCOSE TEST STRIPS) STRP Use as directed to monitor FSBS 3x weekly. Dx: R73.09. 05/25/20   Salley Scarlet, MD  HYDROcodone-acetaminophen (NORCO) 7.5-325 MG tablet Take 1 tablet by mouth 2 (two) times daily as needed for moderate pain.  09/04/20   Alycia Rossetti, MD  Lancets MISC Use as directed to monitor FSBS 3x weekly. Dx: R73.09. 05/25/20   Alycia Rossetti, MD  losartan (COZAAR) 50 MG tablet TAKE 1 TABLET BY MOUTH DAILY Patient taking differently: Take 50 mg by mouth daily. 08/31/20   Alycia Rossetti, MD  Magnesium 250 MG TABS Take 1 tablet (250 mg total) by mouth daily. Patient taking differently: Take 250 mg by mouth daily in the afternoon. Midday 08/16/18   [place], Modena Nunnery, MD  metFORMIN (GLUCOPHAGE) 500 MG tablet Take 1 tablet (500 mg total) by mouth 2 (two) times daily with a meal. 08/12/20   [place], Modena Nunnery, MD  nystatin (MYCOSTATIN/NYSTOP) powder Apply 1 application topically 3 (three) times daily. 01/08/20   [place], Modena Nunnery, MD  omeprazole (PRILOSEC) 40 MG capsule TAKE 1 CAPSULE(40 MG) BY MOUTH DAILY Patient taking differently: Take 40 mg by mouth daily. 06/29/20   Alycia Rossetti, MD  OXYGEN Inhale 1 L into the lungs at bedtime. IN ADDITION TO CPAP NIGHTLY    [provider]  potassium chloride (KLOR-CON) 10 MEQ tablet TAKE 1 TABLET BY MOUTH DAILY WITH FLUID PILL Patient taking differently: Take 10 mEq by mouth daily. With fluid pill 09/01/20   Alycia Rossetti, MD  predniSONE (DELTASONE) 10 MG tablet Pt currently on 17m daily, start taking 553mdaily for one month, then decrease to 4065maily for 1 month, then decrease 63m76mily. Patient taking differently: Take 30 mg by mouth daily. 07/16/20   [place], RichNanine Means  pyridostigmine (MESTINON) 180 MG CR tablet Take 180 mg  by mouth daily. 06/27/20   [provider]  pyridostigmine (MESTINON) 60 MG tablet Take 1 tablet (60 mg total) by mouth 3 (three) times daily. 04/01/20   [place], VikrEarlean Polka  traZODone (DESYREL) 100 MG tablet TAKE 1 TABLET BY MOUTH AT BEDTIME AS NEEDED FOR SLEEP Patient taking differently: Take 100 mg by mouth at bedtime as needed for sleep. 07/07/20   DurhAlycia Rossetti    Allergies    Patient has no known allergies.  Review of Systems   Review of Systems  Constitutional: Negative for chills, fatigue and fever.  HENT: Negative for trouble swallowing.   Respiratory: Negative for cough and shortness of breath.   Cardiovascular: Positive for leg swelling. Negative for chest pain and palpitations.  Gastrointestinal: Negative for abdominal pain, nausea and vomiting.  Genitourinary: Negative for dysuria.  Musculoskeletal: Positive for myalgias (Pain and swelling of both lower legs). Negative for arthralgias, back pain, neck pain and neck stiffness.  Skin: Positive for color change and wound.       Blisters and weeping of the skin to the lower legs and ankles  Neurological: Negative for dizziness, weakness and numbness.  Hematological: Does not bruise/bleed easily.  Psychiatric/Behavioral: Negative for confusion.    Physical Exam Updated Vital Signs BP (!) 145/72   Pulse 98   Temp 98 F (36.7 C) (Oral)   Resp 18   Ht 5' 4.5" (1.638 m)   Wt 116.6 kg   SpO2 98%   BMI 43.43 kg/m   Physical Exam Vitals and nursing note reviewed.  Constitutional:      Appearance: Normal appearance. She is obese.  HENT:     Head: Normocephalic.  Eyes:     Conjunctiva/sclera: Conjunctivae normal.     Pupils: Pupils are equal, round, and reactive to light.  Neck:     Thyroid: No thyromegaly.  Meningeal: Kernig's sign absent.  Cardiovascular:     Rate and Rhythm: Normal rate and regular rhythm.     Pulses: Normal pulses.  Pulmonary:     Effort: Pulmonary effort is normal. No  respiratory distress.     Breath sounds: Normal breath sounds. No rales.  Chest:     Chest wall: No tenderness.  Abdominal:     Palpations: Abdomen is soft.     Tenderness: There is no abdominal tenderness. There is no guarding or rebound.  Musculoskeletal:        General: Swelling and tenderness present. Normal range of motion.     Cervical back: Normal range of motion.     Right lower leg: Edema present.     Left lower leg: Edema present.     Comments: Pitting edema of the bilateral lower extremities with 1 intact blister of the lateral right ankle.  Extremities are warm.  serous drainage to several recently opened bulla of the right anterior lower leg and left medial foot/ankle.  See attached photos.  Skin:    General: Skin is warm.     Findings: No rash.  Neurological:     General: No focal deficit present.     Mental Status: She is alert.     Sensory: No sensory deficit.     Motor: No weakness.          ED Results / Procedures / Treatments   Labs (all labs ordered are listed, but only abnormal results are displayed) Labs Reviewed  CBC WITH DIFFERENTIAL/PLATELET - Abnormal; Notable for the following components:      Result Value   WBC 12.0 (*)    RBC 3.30 (*)    Hemoglobin 9.9 (*)    HCT 31.0 (*)    RDW 15.6 (*)    nRBC 0.3 (*)    Neutro Abs 8.7 (*)    Abs Immature Granulocytes 0.35 (*)    All other components within normal limits  COMPREHENSIVE METABOLIC PANEL - Abnormal; Notable for the following components:   Sodium 133 (*)    Glucose, Bld 108 (*)    BUN 38 (*)    Creatinine, Ser 1.84 (*)    Calcium 8.2 (*)    Total Protein 6.2 (*)    Albumin 2.8 (*)    GFR, Estimated 29 (*)    All other components within normal limits    EKG None  Radiology DG Chest Portable 1 View  Result Date: 09/23/2020 CLINICAL DATA:  Increased peripheral edema EXAM: PORTABLE CHEST 1 VIEW COMPARISON:  07/11/2020 FINDINGS: Heart is borderline in size. Lingular scarring or  atelectasis at the left base. Right lung clear. No effusions or edema. No acute bony abnormality. IMPRESSION: Lingular scarring or atelectasis.  No active disease. Electronically Signed   By: Rolm Baptise M.D.   On: 09/23/2020 10:41    Procedures Procedures   Medications Ordered in ED Medications - No data to display  ED Course  I have reviewed the triage vital signs and the nursing notes.  Pertinent labs & imaging results that were available during my care of the patient were reviewed by me and considered in my medical decision making (see chart for details).   MDM Rules/Calculators/A&P                          Patient seen here yesterday for similar symptoms.  Reports increasing pain and edema of the bilateral lower extremities serous drainage  noted from recently opened bulla.  Extremities neurovascularly intact.  No fever or chills, chest pain or dyspnea.  Vital signs reassuring.  Patient does live alone, unable to ambulate or care for self due to difficulty walking.  BNP yesterday 34 and chest x-ray without evidence of pleural effusion.  This evening, leukocytosis improving.  One-point drop in hemoglobin from yesterday of unclear origin. No abd pain, dark or bloody stools reported.   Discussed findings with [facility] hospitalist, Dr. Clearence Ped who agrees to see pt in ED. After evaluating patient, she felt pt  appropriate for d/c home.  Was referred to PT yesterday by mistake.  Pt stated that she is willing to follow-up with wound care if proper referral is made.  Final Clinical Impression(s) / ED Diagnoses Final diagnoses:  Peripheral edema    Rx / DC Orders ED Discharge Orders    None       Kem Parkinson, PA-C 09/24/20 2359    Milton Ferguson, MD 09/25/20 1507

## 2020-09-24 NOTE — Consult Note (Signed)
Consult    Patient Demographics:    [name], is a 70 y.o. female  MRN: [id number]  DOB - 05/13/1951  Admit Date - 09/24/2020  Referring MD/NP/PA: [place]  Outpatient Primary MD for the patient is Keenes, Modena Nunnery, MD  Patient coming from: Home  Chief complaint- swollen ankles   HPI:    [name]  is a 70 y.o. female, with a past medical history significant for hypertension, hyperlipidemia, morbid obesity, sleep apnea with CPAP, chronic peripheral edema-likely secondary to venous insufficiency, steroid-induced diabetes type 2 and more who presents to the ED due to bilateral lower extremity edema.  Patient is currently on Lasix 40 mg twice daily at home and she reports compliance with this medication.  She reports that she saw her PCP and he increased her Lasix dose for 5 days.  Since that time she has not had any improvement in her swelling.  She was seen here in the ED yesterday complaining of 2 bullous blisters on her lower extremities.  She also complained of pain in her legs at that time.  Today her complaints are pretty much the same, she does report that the blisters have broken and are now weeping.  She still has no fevers, no chills, no nausea, no vomiting, no chest pain, no dysuria, no melena, no hematochezia, no diarrhea, or any other complaints.  Patient was referred yesterday to wound care, however she reports that she was unable to go to wound care because there was a mistake with the referral and physical therapy called her instead.  Patient reports that she is open to trying wound care, but does not want Unna boots.  Patient is not wrapping her legs at home, not elevating her legs, but does report she tries to eat a low-sodium diet.  Consult is requested to evaluate patient for admission.   In the ED  Temp 98, heart rate 98, respiratory 18, blood pressure 135/59, satting at 96% Leukocytosis  improving from yesterday from 12.5, to 12.0, creatinine also improving from yesterday from 1.94-1.84 COVID pending    Review of systems:    In addition to the HPI above,  No Fever-chills, No Headache, No changes with Vision or hearing, No problems swallowing food or Liquids, No Chest pain, Cough or worsening shortness of Breath, No Abdominal pain, No Nausea or Vomiting, bowel movements are regular, No Blood in stool or Urine, No dysuria, No new joints pains-aches,  No new weakness, tingling, numbness in any extremity, No recent weight gain or loss, No polyuria, polydypsia or polyphagia, No significant Mental Stressors.  All other systems reviewed and are negative.    Past History of the following :    Past Medical History:  Diagnosis Date  . Achilles tendinitis   . Anxiety   . Asthma   . Depression   . GERD (gastroesophageal reflux disease)   . Gout   . H/O myasthenia gravis    left eye  . HTN (hypertension)   . Hyperlipidemia   . Low back pain   . Obesity hypoventilation syndrome (  HCC)   . Obstructive sleep apnea   . Osteoarthritis       Past Surgical History:  Procedure Laterality Date  . CATARACT EXTRACTION W/PHACO Left 09/17/2015   Procedure: CATARACT EXTRACTION PHACO AND INTRAOCULAR LENS PLACEMENT LEFT EYE cde=8.58;  Surgeon: Tonny Branch, MD;  Location: [facility];  Service: Ophthalmology;  Laterality: Left;  . CATARACT EXTRACTION W/PHACO Right 05/15/2017   Procedure: CATARACT EXTRACTION PHACO AND INTRAOCULAR LENS PLACEMENT (IOC);  Surgeon: Tonny Branch, MD;  Location: [facility];  Service: Ophthalmology;  Laterality: Right;  CDE: 6.34  . COLONOSCOPY N/A 12/25/2013   Procedure: COLONOSCOPY;  Surgeon: Rogene Houston, MD;  Location: [facility];  Service: Endoscopy;  Laterality: N/A;  730-rescheduled Ann notified pt  . COLONOSCOPY WITH PROPOFOL N/A 10/04/2019   Procedure: COLONOSCOPY WITH PROPOFOL;  Surgeon: Rogene Houston, MD;  Location: [facility];  Service:  Endoscopy;  Laterality: N/A;  815  . CYST EXCISION N/A 09/12/2016   Procedure: EXCISION SEBACEOUS CYST, BACK;  Surgeon: Aviva Signs, MD;  Location: [facility];  Service: General;  Laterality: N/A;  . INTRAOCULAR LENS INSERTION Bilateral 2018   Dr. Tonny Branch   . POLYPECTOMY  10/04/2019   Procedure: POLYPECTOMY;  Surgeon: Rogene Houston, MD;  Location: [facility];  Service: Endoscopy;;  . TUBAL LIGATION        Social History:      Social History   Tobacco Use  . Smoking status: Former Smoker    Packs/day: 2.00    Years: 36.00    Pack years: 72.00    Types: Cigarettes    Start date: 06/25/1969    Quit date: 03/25/2005    Years since quitting: 15.5  . Smokeless tobacco: Never Used  Substance Use Topics  . Alcohol use: No    Alcohol/week: 0.0 standard drinks       Family History :     Family History  Problem Relation Age of Onset  . Heart disease Mother   . Thyroid disease Mother   . Heart failure Father   . Lung disease Father   . Diabetes Brother   . Crohn's disease Daughter       Home Medications:   Prior to Admission medications   Medication Sig Start Date End Date Taking? Authorizing Provider  acetaminophen (TYLENOL) 500 MG tablet Take 1,000 mg by mouth every 6 (six) hours as needed for mild pain.     [provider]  albuterol (VENTOLIN HFA) 108 (90 Base) MCG/ACT inhaler INHALE 2 PUFFS INTO THE LUNGS EVERY 4 HOURS AS NEEDED FOR WHEEZING OR SHORTNESS OF BREATH Patient taking differently: Inhale 2 puffs into the lungs every 4 (four) hours as needed for wheezing or shortness of breath. 01/23/20   [place], Modena Nunnery, MD  allopurinol (ZYLOPRIM) 100 MG tablet TAKE 1 TABLET(100 MG) BY MOUTH DAILY Patient taking differently: Take 100 mg by mouth daily. 02/28/20   Alycia Rossetti, MD  aspirin EC 81 MG tablet Take 1 tablet (81 mg total) by mouth every evening. 10/05/19   Rehman, Mechele Dawley, MD  atorvastatin (LIPITOR) 40 MG tablet TAKE 1 TABLET(40 MG) BY MOUTH DAILY AT  6 PM Patient taking differently: Take 40 mg by mouth daily. 08/31/20   Alycia Rossetti, MD  Blood Glucose Monitoring Suppl (BLOOD GLUCOSE SYSTEM PAK) KIT Use as directed to monitor FSBS 3x weekly. Dx: R73.09. 05/25/20   Alycia Rossetti, MD  busPIRone (BUSPAR) 5 MG tablet Take 1 tablet (5 mg total)  by mouth 2 (two) times daily. For anxiety 08/12/20   Alycia Rossetti, MD  clotrimazole (LOTRIMIN) 1 % cream Apply 1 application topically 2 (two) times daily. 01/08/20   Alycia Rossetti, MD  diphenhydrAMINE (BENADRYL) 25 MG tablet Take 25 mg by mouth daily.    [provider]  fluticasone (CUTIVATE) 0.05 % cream APPLY EXTERNALLY TO THE AFFECTED AREA DAILY Patient not taking: No sig reported 08/13/18   Alycia Rossetti, MD  furosemide (LASIX) 40 MG tablet TAKE 1 TABLET(40 MG) BY MOUTH DAILY Patient taking differently: Take 40 mg by mouth daily. 09/08/20   [place], Modena Nunnery, MD  Glucose Blood (BLOOD GLUCOSE TEST STRIPS) STRP Use as directed to monitor FSBS 3x weekly. Dx: R73.09. 05/25/20   Alycia Rossetti, MD  HYDROcodone-acetaminophen (NORCO) 7.5-325 MG tablet Take 1 tablet by mouth 2 (two) times daily as needed for moderate pain. 09/04/20   Alycia Rossetti, MD  Lancets MISC Use as directed to monitor FSBS 3x weekly. Dx: R73.09. 05/25/20   Alycia Rossetti, MD  losartan (COZAAR) 50 MG tablet TAKE 1 TABLET BY MOUTH DAILY Patient taking differently: Take 50 mg by mouth daily. 08/31/20   Alycia Rossetti, MD  Magnesium 250 MG TABS Take 1 tablet (250 mg total) by mouth daily. Patient taking differently: Take 250 mg by mouth daily in the afternoon. Midday 08/16/18   [place], Modena Nunnery, MD  metFORMIN (GLUCOPHAGE) 500 MG tablet Take 1 tablet (500 mg total) by mouth 2 (two) times daily with a meal. 08/12/20   [place], Modena Nunnery, MD  nystatin (MYCOSTATIN/NYSTOP) powder Apply 1 application topically 3 (three) times daily. 01/08/20   [place], Modena Nunnery, MD  omeprazole (PRILOSEC) 40 MG capsule TAKE 1 CAPSULE(40  MG) BY MOUTH DAILY Patient taking differently: Take 40 mg by mouth daily. 06/29/20   Alycia Rossetti, MD  OXYGEN Inhale 1 L into the lungs at bedtime. IN ADDITION TO CPAP NIGHTLY    [provider]  potassium chloride (KLOR-CON) 10 MEQ tablet TAKE 1 TABLET BY MOUTH DAILY WITH FLUID PILL Patient taking differently: Take 10 mEq by mouth daily. With fluid pill 09/01/20   Alycia Rossetti, MD  predniSONE (DELTASONE) 10 MG tablet Pt currently on 44m daily, start taking 535mdaily for one month, then decrease to 4069maily for 1 month, then decrease 79m18mily. Patient taking differently: Take 30 mg by mouth daily. 07/16/20   [place], RichNanine Means  pyridostigmine (MESTINON) 180 MG CR tablet Take 180 mg by mouth daily. 06/27/20   [provider]  pyridostigmine (MESTINON) 60 MG tablet Take 1 tablet (60 mg total) by mouth 3 (three) times daily. 04/01/20   [place], VikrEarlean Polka  traZODone (DESYREL) 100 MG tablet TAKE 1 TABLET BY MOUTH AT BEDTIME AS NEEDED FOR SLEEP Patient taking differently: Take 100 mg by mouth at bedtime as needed for sleep. 07/07/20   DurhAlycia Rossetti     Allergies:    No Known Allergies   Physical Exam:   Vitals  Blood pressure 137/66, pulse 98, temperature 98 F (36.7 C), temperature source Oral, resp. rate 18, height 5' 4.5" (1.638 m), weight 116.6 kg, SpO2 98 %.  1.  General: Patient lying supine in bed with head of bed elevated, no acute distress  2. Psychiatric: Flat affect, cooperative with exam, alert and oriented x3  3. Neurologic: Alert and oriented x3, face is symmetric, speech and language are normal, no acute deficit noted on limited  exam  4. HEENMT:  Head is atraumatic, normocephalic, pupils are reactive to light, neck is supple without masses or JVD, mucous membranes are moist  5. Respiratory : Lungs are clear to auscultation bilaterally without wheezing, rhonchi, crackles.  No cyanosis  6. Cardiovascular : Heart rate is normal,  rhythm is regular, no murmurs rubs or gallops, bilateral lower extremity edema peripheral pulses present  7. Gastrointestinal:  Abdomen is soft, nondistended, nontender to palpation  8. Skin:  Lower extremity in show signs of venous stasis, weeping, no overlying cellulitis  9.Musculoskeletal:  2-3+ edema lower extremities bilaterally, no acute deformities    Data Review:    CBC Recent Labs  Lab 09/23/20 1118 09/24/20 2121  WBC 12.5* 12.0*  HGB 10.7* 9.9*  HCT 34.2* 31.0*  PLT 310 340  MCV 96.3 93.9  MCH 30.1 30.0  MCHC 31.3 31.9  RDW 15.9* 15.6*  LYMPHSABS 1.8 1.8  MONOABS 1.1* 0.9  EOSABS 0.2 0.1  BASOSABS 0.1 0.1   ------------------------------------------------------------------------------------------------------------------  Results for orders placed or performed during the hospital encounter of 09/24/20 (from the past 48 hour(s))  CBC with Differential/Platelet     Status: Abnormal   Collection Time: 09/24/20  9:21 PM  Result Value Ref Range   WBC 12.0 (H) 4.0 - 10.5 K/uL   RBC 3.30 (L) 3.87 - 5.11 MIL/uL   Hemoglobin 9.9 (L) 12.0 - 15.0 g/dL   HCT 31.0 (L) 36.0 - 46.0 %   MCV 93.9 80.0 - 100.0 fL   MCH 30.0 26.0 - 34.0 pg   MCHC 31.9 30.0 - 36.0 g/dL   RDW 15.6 (H) 11.5 - 15.5 %   Platelets 340 150 - 400 K/uL   nRBC 0.3 (H) 0.0 - 0.2 %   Neutrophils Relative % 72 %   Neutro Abs 8.7 (H) 1.7 - 7.7 K/uL   Lymphocytes Relative 15 %   Lymphs Abs 1.8 0.7 - 4.0 K/uL   Monocytes Relative 8 %   Monocytes Absolute 0.9 0.1 - 1.0 K/uL   Eosinophils Relative 1 %   Eosinophils Absolute 0.1 0.0 - 0.5 K/uL   Basophils Relative 1 %   Basophils Absolute 0.1 0.0 - 0.1 K/uL   Immature Granulocytes 3 %   Abs Immature Granulocytes 0.35 (H) 0.00 - 0.07 K/uL    Comment: Performed at [facility], [address] [place], [place] [address]  Comprehensive metabolic panel     Status: Abnormal   Collection Time: 09/24/20  9:21 PM  Result Value Ref Range   Sodium 133 (L) 135  - 145 mmol/L   Potassium 4.1 3.5 - 5.1 mmol/L   Chloride 100 98 - 111 mmol/L   CO2 23 22 - 32 mmol/L   Glucose, Bld 108 (H) 70 - 99 mg/dL    Comment: Glucose reference range applies only to samples taken after fasting for at least 8 hours.   BUN 38 (H) 8 - 23 mg/dL   Creatinine, Ser 1.84 (H) 0.44 - 1.00 mg/dL   Calcium 8.2 (L) 8.9 - 10.3 mg/dL   Total Protein 6.2 (L) 6.5 - 8.1 g/dL   Albumin 2.8 (L) 3.5 - 5.0 g/dL   AST 15 15 - 41 U/L   ALT 17 0 - 44 U/L   Alkaline Phosphatase 103 38 - 126 U/L   Total Bilirubin 0.6 0.3 - 1.2 mg/dL   GFR, Estimated 29 (L) >60 mL/min    Comment: (NOTE) Calculated using the CKD-EPI Creatinine Equation (2021)    Anion gap  10 5 - 15    Comment: Performed at [facility], [address], [place], [place] [address]    Chemistries  Recent Labs  Lab 09/21/20 1138 09/23/20 1118 09/24/20 2121  NA 138 133* 133*  K 4.3 4.3 4.1  CL 100 98 100  CO2 _0 GLUCOSE 89 101* 108*  BUN 28* 35* 38*  CREATININE 1.57* 1.94* 1.84*  CALCIUM 7.8* 8.1* 8.2*  AST  --   --  15  ALT  --   --  17  ALKPHOS  --   --  103  BILITOT  --   --  0.6   ------------------------------------------------------------------------------------------------------------------  ------------------------------------------------------------------------------------------------------------------ GFR: Estimated Creatinine Clearance: 35.5 mL/min (A) (by C-G formula based on SCr of 1.84 mg/dL (H)). Liver Function Tests: Recent Labs  Lab 09/24/20 2121  AST 15  ALT 17  ALKPHOS 103  BILITOT 0.6  PROT 6.2*  ALBUMIN 2.8*   No results for input(s): LIPASE, AMYLASE in the last 168 hours. No results for input(s): AMMONIA in the last 168 hours. Coagulation Profile: No results for input(s): INR, PROTIME in the last 168 hours. Cardiac Enzymes: No results for input(s): CKTOTAL, CKMB, CKMBINDEX, TROPONINI in the last 168 hours. BNP (last 3 results) No results for input(s): PROBNP in the  last 8760 hours. HbA1C: No results for input(s): HGBA1C in the last 72 hours. CBG: No results for input(s): GLUCAP in the last 168 hours. Lipid Profile: No results for input(s): CHOL, HDL, LDLCALC, TRIG, CHOLHDL, LDLDIRECT in the last 72 hours. Thyroid Function Tests: No results for input(s): TSH, T4TOTAL, FREET4, T3FREE, THYROIDAB in the last 72 hours. Anemia Panel: No results for input(s): VITAMINB12, FOLATE, FERRITIN, TIBC, IRON, RETICCTPCT in the last 72 hours.  --------------------------------------------------------------------------------------------------------------- Urine analysis:    Component Value Date/Time   COLORURINE YELLOW 12/26/2017 2015   APPEARANCEUR CLEAR 12/26/2017 2015   LABSPEC 1.014 12/26/2017 2015   PHURINE 5.0 12/26/2017 2015   GLUCOSEU NEGATIVE 12/26/2017 2015   HGBUR NEGATIVE 12/26/2017 2015   [place] NEGATIVE 12/26/2017 2015   [place] NEGATIVE 12/26/2017 2015   PROTEINUR NEGATIVE 12/26/2017 2015   NITRITE NEGATIVE 12/26/2017 2015   LEUKOCYTESUR SMALL (A) 12/26/2017 2015      Imaging Results:    DG Chest Portable 1 View  Result Date: 09/23/2020 CLINICAL DATA:  Increased peripheral edema EXAM: PORTABLE CHEST 1 VIEW COMPARISON:  07/11/2020 FINDINGS: Heart is borderline in size. Lingular scarring or atelectasis at the left base. Right lung clear. No effusions or edema. No acute bony abnormality. IMPRESSION: Lingular scarring or atelectasis.  No active disease. Electronically Signed   By: Rolm Baptise M.D.   On: 09/23/2020 10:41       Assessment & Plan:      1. Bilateral lower extremity edema 1. In the setting of chronic venous insufficiency, obesity 2. Noncompliant with elevation, wrapping her legs, going to wound care 3. Resume normal Lasix dose 4. Refer again to wound care 5. Patient reports compliance with low-sodium diet 6. Wrap legs prior to discharge from ER 2. Chronic kidney disease stage IIIb 1. Creatinine improved from  yesterday from 1.94-1.84 2. Continue Lasix as above 3. Follow-up with PCP   Thank you for including me in this patient's care.  As of now patient's physical exam, history, and improvement from yesterday do not meet criteria for inpatient management.  Patient advised that she will be best served with outpatient services and can avoid risks associated with hospitalizations such as exposure to pathogens, possible DVT,  etc.  Patient declines using Unna boot, but is excepting leg wraps today, plan to wrap legs prior to discharge from the ER.  Patient instructed to continue low-sodium diet and resume Lasix.  There is no signs of overlying cellulitis, and leukocytosis has improved from yesterday, antibiotics not indicated at this time.  Patient reports understanding of this plan.    Time spent in minutes : [place]

## 2020-09-24 NOTE — Consult Note (Incomplete)
[facility] H&P    Patient Demographics:    [name], is a 70 y.o. female  MRN: [id number]  DOB - 05-Apr-1951  Admit Date - 09/24/2020  Referring MD/NP/PA: [place]  Outpatient Primary MD for the patient is [facility], Modena Nunnery, MD  Patient coming from: Home  Chief complaint- swollen ankles   HPI:    [name]  is a 70 y.o. female, with a past medical history significant for hypertension, hyperlipidemia, morbid obesity, sleep apnea with CPAP, chronic peripheral edema-likely secondary to venous insufficiency, steroid-induced diabetes type 2 and more who presents to the ED due to bilateral lower extremity edema.  Patient is currently on Lasix 40 mg twice daily at home and she reports compliance with this medication.  She reports that she saw her PCP and he increased her Lasix dose for 5 days.  Since that time she has not had any improvement in her swelling.  She was seen here in the ED yesterday complaining of 2 bullous blisters on her lower extremities.  She also complained of pain in her legs at that time.  Today her complaints are pretty much the same, she does report that the blisters have broken and are now weeping.  She still has no fevers, no chills, no nausea, no vomiting, no chest pain, no dysuria, no melena, no hematochezia, no diarrhea, or any other complaints.  Patient was referred yesterday to wound care, however she reports that she was unable to go to wound care because there was a mistake with the referral and physical therapy called her instead.  Patient reports that she is open to trying wound care, but does not want Unna boots.  Patient is not wrapping her legs at home, not elevating her legs, but does report she tries to eat a low-sodium diet.  Consult is requested to evaluate patient for admission.   In the ED  Temp 98, heart rate 98, respiratory 18, blood pressure 135/59, satting at  96% Leukocytosis improving from yesterday from 12.5, to 12.0, creatinine also improving from yesterday from 1.94-1.84 COVID pending    Review of systems:    In addition to the HPI above,  No Fever-chills, No Headache, No changes with Vision or hearing, No problems swallowing food or Liquids, No Chest pain, Cough or worsening shortness of Breath, No Abdominal pain, No Nausea or Vomiting, bowel movements are regular, No Blood in stool or Urine, No dysuria, No new joints pains-aches,  No new weakness, tingling, numbness in any extremity, No recent weight gain or loss, No polyuria, polydypsia or polyphagia, No significant Mental Stressors.  All other systems reviewed and are negative.    Past History of the following :    Past Medical History:  Diagnosis Date  . Achilles tendinitis   . Anxiety   . Asthma   . Depression   . GERD (gastroesophageal reflux disease)   . Gout   . H/O myasthenia gravis    left eye  . HTN (hypertension)   . Hyperlipidemia   . Low back pain   .  Obesity hypoventilation syndrome ([place])   . Obstructive sleep apnea   . Osteoarthritis       Past Surgical History:  Procedure Laterality Date  . CATARACT EXTRACTION W/PHACO Left 09/17/2015   Procedure: CATARACT EXTRACTION PHACO AND INTRAOCULAR LENS PLACEMENT LEFT EYE cde=8.58;  Surgeon: Tonny Branch, MD;  Location: [facility];  Service: Ophthalmology;  Laterality: Left;  . CATARACT EXTRACTION W/PHACO Right 05/15/2017   Procedure: CATARACT EXTRACTION PHACO AND INTRAOCULAR LENS PLACEMENT (IOC);  Surgeon: Tonny Branch, MD;  Location: [facility];  Service: Ophthalmology;  Laterality: Right;  CDE: 6.34  . COLONOSCOPY N/A 12/25/2013   Procedure: COLONOSCOPY;  Surgeon: Rogene Houston, MD;  Location: [facility];  Service: Endoscopy;  Laterality: N/A;  730-rescheduled Ann notified pt  . COLONOSCOPY WITH PROPOFOL N/A 10/04/2019   Procedure: COLONOSCOPY WITH PROPOFOL;  Surgeon: Rogene Houston, MD;  Location: [facility];  Service: Endoscopy;  Laterality: N/A;  815  . CYST EXCISION N/A 09/12/2016   Procedure: EXCISION SEBACEOUS CYST, BACK;  Surgeon: Aviva Signs, MD;  Location: [facility];  Service: General;  Laterality: N/A;  . INTRAOCULAR LENS INSERTION Bilateral 2018   Dr. Tonny Branch   . POLYPECTOMY  10/04/2019   Procedure: POLYPECTOMY;  Surgeon: Rogene Houston, MD;  Location: [facility];  Service: Endoscopy;;  . TUBAL LIGATION        Social History:      Social History   Tobacco Use  . Smoking status: Former Smoker    Packs/day: 2.00    Years: 36.00    Pack years: 72.00    Types: Cigarettes    Start date: 06/25/1969    Quit date: 03/25/2005    Years since quitting: 15.5  . Smokeless tobacco: Never Used  Substance Use Topics  . Alcohol use: No    Alcohol/week: 0.0 standard drinks       Family History :     Family History  Problem Relation Age of Onset  . Heart disease Mother   . Thyroid disease Mother   . Heart failure Father   . Lung disease Father   . Diabetes Brother   . Crohn's disease Daughter       Home Medications:   Prior to Admission medications   Medication Sig Start Date End Date Taking? Authorizing Provider  acetaminophen (TYLENOL) 500 MG tablet Take 1,000 mg by mouth every 6 (six) hours as needed for mild pain.     [provider]  albuterol (VENTOLIN HFA) 108 (90 Base) MCG/ACT inhaler INHALE 2 PUFFS INTO THE LUNGS EVERY 4 HOURS AS NEEDED FOR WHEEZING OR SHORTNESS OF BREATH Patient taking differently: Inhale 2 puffs into the lungs every 4 (four) hours as needed for wheezing or shortness of breath. 01/23/20   [place], Modena Nunnery, MD  allopurinol (ZYLOPRIM) 100 MG tablet TAKE 1 TABLET(100 MG) BY MOUTH DAILY Patient taking differently: Take 100 mg by mouth daily. 02/28/20   Alycia Rossetti, MD  aspirin EC 81 MG tablet Take 1 tablet (81 mg total) by mouth every evening. 10/05/19   Rehman, Mechele Dawley, MD  atorvastatin (LIPITOR) 40 MG tablet TAKE 1 TABLET(40 MG)  BY MOUTH DAILY AT 6 PM Patient taking differently: Take 40 mg by mouth daily. 08/31/20   Alycia Rossetti, MD  Blood Glucose Monitoring Suppl (BLOOD GLUCOSE SYSTEM PAK) KIT Use as directed to monitor FSBS 3x weekly. Dx: R73.09. 05/25/20   Alycia Rossetti, MD  busPIRone (BUSPAR) 5 MG tablet Take 1 tablet (  5 mg total) by mouth 2 (two) times daily. For anxiety 08/12/20   Alycia Rossetti, MD  clotrimazole (LOTRIMIN) 1 % cream Apply 1 application topically 2 (two) times daily. 01/08/20   Alycia Rossetti, MD  diphenhydrAMINE (BENADRYL) 25 MG tablet Take 25 mg by mouth daily.    [provider]  fluticasone (CUTIVATE) 0.05 % cream APPLY EXTERNALLY TO THE AFFECTED AREA DAILY Patient not taking: No sig reported 08/13/18   Alycia Rossetti, MD  furosemide (LASIX) 40 MG tablet TAKE 1 TABLET(40 MG) BY MOUTH DAILY Patient taking differently: Take 40 mg by mouth daily. 09/08/20   [place], Modena Nunnery, MD  Glucose Blood (BLOOD GLUCOSE TEST STRIPS) STRP Use as directed to monitor FSBS 3x weekly. Dx: R73.09. 05/25/20   Alycia Rossetti, MD  HYDROcodone-acetaminophen (NORCO) 7.5-325 MG tablet Take 1 tablet by mouth 2 (two) times daily as needed for moderate pain. 09/04/20   Alycia Rossetti, MD  Lancets MISC Use as directed to monitor FSBS 3x weekly. Dx: R73.09. 05/25/20   Alycia Rossetti, MD  losartan (COZAAR) 50 MG tablet TAKE 1 TABLET BY MOUTH DAILY Patient taking differently: Take 50 mg by mouth daily. 08/31/20   Alycia Rossetti, MD  Magnesium 250 MG TABS Take 1 tablet (250 mg total) by mouth daily. Patient taking differently: Take 250 mg by mouth daily in the afternoon. Midday 08/16/18   [place], Modena Nunnery, MD  metFORMIN (GLUCOPHAGE) 500 MG tablet Take 1 tablet (500 mg total) by mouth 2 (two) times daily with a meal. 08/12/20   [place], Modena Nunnery, MD  nystatin (MYCOSTATIN/NYSTOP) powder Apply 1 application topically 3 (three) times daily. 01/08/20   [place], Modena Nunnery, MD  omeprazole (PRILOSEC) 40 MG capsule  TAKE 1 CAPSULE(40 MG) BY MOUTH DAILY Patient taking differently: Take 40 mg by mouth daily. 06/29/20   Alycia Rossetti, MD  OXYGEN Inhale 1 L into the lungs at bedtime. IN ADDITION TO CPAP NIGHTLY    [provider]  potassium chloride (KLOR-CON) 10 MEQ tablet TAKE 1 TABLET BY MOUTH DAILY WITH FLUID PILL Patient taking differently: Take 10 mEq by mouth daily. With fluid pill 09/01/20   Alycia Rossetti, MD  predniSONE (DELTASONE) 10 MG tablet Pt currently on 61m daily, start taking 570mdaily for one month, then decrease to 4044maily for 1 month, then decrease 61m69mily. Patient taking differently: Take 30 mg by mouth daily. 07/16/20   [place]ater, RichNanine Means  pyridostigmine (MESTINON) 180 MG CR tablet Take 180 mg by mouth daily. 06/27/20   [provider]  pyridostigmine (MESTINON) 60 MG tablet Take 1 tablet (60 mg total) by mouth 3 (three) times daily. 04/01/20   [place], VikrEarlean Polka  traZODone (DESYREL) 100 MG tablet TAKE 1 TABLET BY MOUTH AT BEDTIME AS NEEDED FOR SLEEP Patient taking differently: Take 100 mg by mouth at bedtime as needed for sleep. 07/07/20   DurhAlycia Rossetti     Allergies:    No Known Allergies   Physical Exam:   Vitals  Blood pressure 137/66, pulse 98, temperature 98 F (36.7 C), temperature source Oral, resp. rate 18, height 5' 4.5" (1.638 m), weight 116.6 kg, SpO2 98 %.  1.  General: Patient lying supine in bed with head of bed elevated, no acute distress  2. Psychiatric: Flat affect, cooperative with exam, alert and oriented x3  3. Neurologic: Alert and oriented x3, face is symmetric, speech and language are normal, no acute deficit  noted on limited exam  4. HEENMT:  Head is atraumatic, normocephalic, pupils are reactive to light, neck is supple without masses or JVD, mucous membranes are moist  5. Respiratory : Lungs are clear to auscultation bilaterally without wheezing, rhonchi, crackles.  No cyanosis  6. Cardiovascular  : Heart rate is normal, rhythm is regular, no murmurs rubs or gallops, bilateral lower extremity edema peripheral pulses present  7. Gastrointestinal:  Abdomen is soft, nondistended, nontender to palpation  8. Skin:  Lower extremity  9.Musculoskeletal:  ***    Data Review:    CBC Recent Labs  Lab 09/23/20 1118 09/24/20 2121  WBC 12.5* 12.0*  HGB 10.7* 9.9*  HCT 34.2* 31.0*  PLT 310 340  MCV 96.3 93.9  MCH 30.1 30.0  MCHC 31.3 31.9  RDW 15.9* 15.6*  LYMPHSABS 1.8 1.8  MONOABS 1.1* 0.9  EOSABS 0.2 0.1  BASOSABS 0.1 0.1   ------------------------------------------------------------------------------------------------------------------  Results for orders placed or performed during the hospital encounter of 09/24/20 (from the past 48 hour(s))  CBC with Differential/Platelet     Status: Abnormal   Collection Time: 09/24/20  9:21 PM  Result Value Ref Range   WBC 12.0 (H) 4.0 - 10.5 K/uL   RBC 3.30 (L) 3.87 - 5.11 MIL/uL   Hemoglobin 9.9 (L) 12.0 - 15.0 g/dL   HCT 31.0 (L) 36.0 - 46.0 %   MCV 93.9 80.0 - 100.0 fL   MCH 30.0 26.0 - 34.0 pg   MCHC 31.9 30.0 - 36.0 g/dL   RDW 15.6 (H) 11.5 - 15.5 %   Platelets 340 150 - 400 K/uL   nRBC 0.3 (H) 0.0 - 0.2 %   Neutrophils Relative % 72 %   Neutro Abs 8.7 (H) 1.7 - 7.7 K/uL   Lymphocytes Relative 15 %   Lymphs Abs 1.8 0.7 - 4.0 K/uL   Monocytes Relative 8 %   Monocytes Absolute 0.9 0.1 - 1.0 K/uL   Eosinophils Relative 1 %   Eosinophils Absolute 0.1 0.0 - 0.5 K/uL   Basophils Relative 1 %   Basophils Absolute 0.1 0.0 - 0.1 K/uL   Immature Granulocytes 3 %   Abs Immature Granulocytes 0.35 (H) 0.00 - 0.07 K/uL    Comment: Performed at [facility], [address] [place], [place] [address]  Comprehensive metabolic panel     Status: Abnormal   Collection Time: 09/24/20  9:21 PM  Result Value Ref Range   Sodium 133 (L) 135 - 145 mmol/L   Potassium 4.1 3.5 - 5.1 mmol/L   Chloride 100 98 - 111 mmol/L   CO2 23 22 - 32  mmol/L   Glucose, Bld 108 (H) 70 - 99 mg/dL    Comment: Glucose reference range applies only to samples taken after fasting for at least 8 hours.   BUN 38 (H) 8 - 23 mg/dL   Creatinine, Ser 1.84 (H) 0.44 - 1.00 mg/dL   Calcium 8.2 (L) 8.9 - 10.3 mg/dL   Total Protein 6.2 (L) 6.5 - 8.1 g/dL   Albumin 2.8 (L) 3.5 - 5.0 g/dL   AST 15 15 - 41 U/L   ALT 17 0 - 44 U/L   Alkaline Phosphatase 103 38 - 126 U/L   Total Bilirubin 0.6 0.3 - 1.2 mg/dL   GFR, Estimated 29 (L) >60 mL/min    Comment: (NOTE) Calculated using the CKD-EPI Creatinine Equation (2021)    Anion gap 10 5 - 15    Comment: Performed at [facility], [address] [place], [place] [address]    Chemistries  Recent Labs  Lab 09/21/20 1138 09/23/20 1118 09/24/20 2121  NA 138 133* 133*  K 4.3 4.3 4.1  CL 100 98 100  CO2 27 25 23   GLUCOSE 89 101* 108*  BUN 28* 35* 38*  CREATININE 1.57* 1.94* 1.84*  CALCIUM 7.8* 8.1* 8.2*  AST  --   --  15  ALT  --   --  17  ALKPHOS  --   --  103  BILITOT  --   --  0.6   ------------------------------------------------------------------------------------------------------------------  ------------------------------------------------------------------------------------------------------------------ GFR: Estimated Creatinine Clearance: 35.5 mL/min (A) (by C-G formula based on SCr of 1.84 mg/dL (H)). Liver Function Tests: Recent Labs  Lab 09/24/20 2121  AST 15  ALT 17  ALKPHOS 103  BILITOT 0.6  PROT 6.2*  ALBUMIN 2.8*   No results for input(s): LIPASE, AMYLASE in the last 168 hours. No results for input(s): AMMONIA in the last 168 hours. Coagulation Profile: No results for input(s): INR, PROTIME in the last 168 hours. Cardiac Enzymes: No results for input(s): CKTOTAL, CKMB, CKMBINDEX, TROPONINI in the last 168 hours. BNP (last 3 results) No results for input(s): PROBNP in the last 8760 hours. HbA1C: No results for input(s): HGBA1C in the last 72 hours. CBG: No results  for input(s): GLUCAP in the last 168 hours. Lipid Profile: No results for input(s): CHOL, HDL, LDLCALC, TRIG, CHOLHDL, LDLDIRECT in the last 72 hours. Thyroid Function Tests: No results for input(s): TSH, T4TOTAL, FREET4, T3FREE, THYROIDAB in the last 72 hours. Anemia Panel: No results for input(s): VITAMINB12, FOLATE, FERRITIN, TIBC, IRON, RETICCTPCT in the last 72 hours.  --------------------------------------------------------------------------------------------------------------- Urine analysis:    Component Value Date/Time   COLORURINE YELLOW 12/26/2017 2015   APPEARANCEUR CLEAR 12/26/2017 2015   LABSPEC 1.014 12/26/2017 2015   PHURINE 5.0 12/26/2017 2015   GLUCOSEU NEGATIVE 12/26/2017 2015   HGBUR NEGATIVE 12/26/2017 2015   [place] NEGATIVE 12/26/2017 2015   [place] NEGATIVE 12/26/2017 2015   PROTEINUR NEGATIVE 12/26/2017 2015   NITRITE NEGATIVE 12/26/2017 2015   LEUKOCYTESUR SMALL (A) 12/26/2017 2015      Imaging Results:    DG Chest Portable 1 View  Result Date: 09/23/2020 CLINICAL DATA:  Increased peripheral edema EXAM: PORTABLE CHEST 1 VIEW COMPARISON:  07/11/2020 FINDINGS: Heart is borderline in size. Lingular scarring or atelectasis at the left base. Right lung clear. No effusions or edema. No acute bony abnormality. IMPRESSION: Lingular scarring or atelectasis.  No active disease. Electronically Signed   By: Rolm Baptise M.D.   On: 09/23/2020 10:41    My personal review of EKG: Rhythm NSR, Rate ** /min, QTc *** ,no Acute ST changes   Assessment & Plan:    Active Problems:   * No active hospital problems. *   1. *** 2.    DVT Prophylaxis-   Lovenox - SCDs ***  AM Labs Ordered, also please review Full Orders  Family Communication: Admission, patients condition and plan of care including tests being ordered have been discussed with the patient and **** who indicate understanding and agree with the plan and Code Status.  Code Status:  ***  Admission  status: Observation/Inpatient :The appropriate admission status for this patient is INPATIENT. Inpatient status is judged to be reasonable and necessary in order to provide the required intensity of service to ensure the patient's safety. The patient's presenting symptoms, physical exam findings, and initial radiographic and laboratory data in the context of their chronic comorbidities is  felt to place them at high risk for further clinical deterioration. Furthermore, it is not anticipated that the patient will be medically stable for discharge from the hospital within 2 midnights of admission. The following factors support the admission status of inpatient.     The patient's presenting symptoms include ***. The worrisome physical exam findings include ***. The initial radiographic and laboratory data are worrisome because of ***. The chronic co-morbidities include ***.       * I certify that at the point of admission it is my clinical judgment that the patient will require inpatient hospital care spanning beyond 2 midnights from the point of admission due to high intensity of service, high risk for further deterioration and high frequency of surveillance required.*  Time spent in minutes : ***   [place] B Zierle-Ghosh M.D

## 2020-09-24 NOTE — ED Triage Notes (Signed)
Pt c/o bilateral swelling to feet and ankles; pt reports she was seen for same yesterday; reports increased swelling and drainage

## 2020-09-24 NOTE — ED Notes (Addendum)
Patient moved up in bed-two assist  Pt given a warm blanket.

## 2020-09-25 ENCOUNTER — Other Ambulatory Visit: Payer: Self-pay

## 2020-09-25 DIAGNOSIS — I8390 Asymptomatic varicose veins of unspecified lower extremity: Secondary | ICD-10-CM

## 2020-09-25 LAB — SARS CORONAVIRUS 2 (TAT 6-24 HRS): SARS Coronavirus 2: NEGATIVE

## 2020-09-25 NOTE — ED Notes (Signed)
Dressings applied to bilateral lower legs

## 2020-09-28 ENCOUNTER — Ambulatory Visit (HOSPITAL_COMMUNITY)
Admission: RE | Admit: 2020-09-28 | Discharge: 2020-09-28 | Disposition: A | Discharge status: Home / Self Care | Payer: Medicare Other | Source: Ambulatory Visit | Attending: Vascular Surgery | Admitting: Vascular Surgery

## 2020-09-28 ENCOUNTER — Ambulatory Visit (INDEPENDENT_AMBULATORY_CARE_PROVIDER_SITE_OTHER): Payer: Medicare Other | Admitting: Physician Assistant

## 2020-09-28 ENCOUNTER — Other Ambulatory Visit: Payer: Self-pay

## 2020-09-28 VITALS — BP 120/55 | HR 92 | Temp 98.2°F | Resp 20 | Ht 64.5 in | Wt 257.0 lb

## 2020-09-28 DIAGNOSIS — I8390 Asymptomatic varicose veins of unspecified lower extremity: Secondary | ICD-10-CM | POA: Diagnosis not present

## 2020-09-28 DIAGNOSIS — M7989 Other specified soft tissue disorders: Secondary | ICD-10-CM | POA: Diagnosis not present

## 2020-09-28 NOTE — Progress Notes (Signed)
[facility]           [facility] [place]  History and Physical   [name] is a 70 y.o. female who presents with hx of leg swelling.  She states that she has had leg swelling on and off but it has been constant over the past 2 months.  She states she developed these wounds over a couple of weeks.  She did have blisters but they popped.  She states that her PCP put her in una boots for a few days and brought her back and put them on again but she did not tolerate them bc they were painful.  She states she has worn knee high compression but this was also painful.  She states that she elevates her legs but does not get them above her heart.  She states she does walk and does get some pain in her calves bilaterally but this happens sometimes and not all of the time.  Sometimes they may just be weak and tired and not painful.  She has never had any leg or abdominal surgery.  She states they mother had swollen legs but not sure of why.  She has never had a DVT.   When asked about her kidney function, she does not have any knowledge of her elevated kidney function.   She also tells me that she has a hx of heart failure.   The pt is on a statin for cholesterol management.  The pt is on a daily aspirin.   Other AC:  none The pt is on ARB for hypertension.   The pt is diabetic.   Tobacco hx:  former   Past Medical History:  Diagnosis Date  . Achilles tendinitis   . Anxiety   . Asthma   . Depression   . GERD (gastroesophageal reflux disease)   . Gout   . H/O myasthenia gravis    left eye  . HTN (hypertension)   . Hyperlipidemia   . Low back pain   . Obesity hypoventilation syndrome ([place])   . Obstructive sleep apnea   . Osteoarthritis     Past Surgical History:  Procedure Laterality Date  . CATARACT EXTRACTION W/PHACO Left 09/17/2015   Procedure: CATARACT EXTRACTION PHACO AND INTRAOCULAR LENS PLACEMENT LEFT EYE cde=8.58;  Surgeon: Tonny Branch, MD;  Location: [facility];  Service: Ophthalmology;  Laterality: Left;  . CATARACT EXTRACTION W/PHACO Right 05/15/2017   Procedure: CATARACT EXTRACTION PHACO AND INTRAOCULAR LENS PLACEMENT (IOC);  Surgeon: Tonny Branch, MD;  Location: [facility];  Service: Ophthalmology;  Laterality: Right;  CDE: 6.34  . COLONOSCOPY N/A 12/25/2013   Procedure: COLONOSCOPY;  Surgeon: Rogene Houston, MD;  Location: [facility];  Service: Endoscopy;  Laterality: N/A;  730-rescheduled Ann notified pt  . COLONOSCOPY WITH PROPOFOL N/A 10/04/2019   Procedure: COLONOSCOPY WITH PROPOFOL;  Surgeon: Rogene Houston, MD;  Location: [facility];  Service: Endoscopy;  Laterality: N/A;  815  . CYST EXCISION N/A 09/12/2016   Procedure: EXCISION SEBACEOUS CYST, BACK;  Surgeon: Aviva Signs, MD;  Location: [facility];  Service: General;  Laterality: N/A;  . INTRAOCULAR LENS INSERTION Bilateral 2018   Dr. Tonny Branch   . POLYPECTOMY  10/04/2019   Procedure: POLYPECTOMY;  Surgeon: Rogene Houston, MD;  Location: [facility];  Service: Endoscopy;;  . TUBAL LIGATION      Social History   Socioeconomic History  . Marital status:  Widowed    Spouse name: Not on file  . Number of children: 3  . Years of education: 62  . Highest education level: Not on file  Occupational History  . Occupation: disabled    Fish farm manager: UNEMPLOYED  Tobacco Use  . Smoking status: Former Smoker    Packs/day: 2.00    Years: 36.00    Pack years: 72.00    Types: Cigarettes    Start date: 06/25/1969    Quit date: 03/25/2005    Years since quitting: 15.5  . Smokeless tobacco: Never Used  Vaping Use  . Vaping Use: Never used  Substance and Sexual Activity  . Alcohol use: No    Alcohol/week: 0.0 standard drinks  . Drug use: No  . Sexual activity: Not Currently    Birth control/protection: Post-menopausal  Other Topics Concern  . Not on file  Social History Narrative   Originally from [place]. She has always lived in [place]. 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 No pets currently. No bird, mold, or hot tub exposure.    Lives alone   No caffeine   Social Determinants of Health   Financial Resource Strain: Not on file  Food Insecurity: Not on file  Transportation Needs: Not on file  Physical Activity: Not on file  Stress: Not on file  Social Connections: Not on file  Intimate Partner Violence: Not on file     Family History  Problem Relation Age of Onset  . Heart disease Mother   . Thyroid disease Mother   . Heart failure Father   . Lung disease Father   . Diabetes Brother   . Crohn's disease Daughter     Current Outpatient Medications  Medication Sig Dispense Refill  . acetaminophen (TYLENOL) 500 MG tablet Take 1,000 mg by mouth every 6 (six) hours as needed for mild pain.     Marland Kitchen albuterol (VENTOLIN HFA) 108 (90 Base) MCG/ACT inhaler INHALE 2 PUFFS INTO THE LUNGS EVERY 4 HOURS AS NEEDED FOR WHEEZING OR SHORTNESS OF BREATH (Patient taking differently: Inhale 2 puffs into the lungs every 4 (four) hours as needed for wheezing or shortness of breath.) 8.5 g 2  . allopurinol (ZYLOPRIM) 100 MG tablet TAKE 1 TABLET(100 MG) BY MOUTH DAILY (Patient taking differently: Take 100 mg by mouth daily.) 90 tablet 3  . aspirin EC 81 MG tablet Take 1 tablet (81 mg total) by mouth every evening.    Marland Kitchen atorvastatin (LIPITOR) 40 MG tablet TAKE 1 TABLET(40 MG) BY MOUTH DAILY AT 6 PM (Patient taking differently: Take 40 mg by mouth daily.) 90 tablet 1  . Blood Glucose Monitoring Suppl (BLOOD GLUCOSE SYSTEM PAK) KIT Use as directed to monitor FSBS 3x weekly. Dx: R73.09. 1 kit 1  . busPIRone (BUSPAR) 5 MG tablet Take 1 tablet (5 mg total) by mouth 2 (two) times daily. For anxiety 60 tablet 1  . clotrimazole (LOTRIMIN) 1 % cream Apply 1 application topically 2 (two) times daily. 30 g 1  . diphenhydrAMINE (BENADRYL) 25 MG tablet Take 25 mg by mouth daily.    . fluticasone (CUTIVATE) 0.05 % cream APPLY EXTERNALLY TO THE AFFECTED AREA DAILY (Patient  not taking: No sig reported) 30 g 3  . furosemide (LASIX) 40 MG tablet TAKE 1 TABLET(40 MG) BY MOUTH DAILY (Patient taking differently: Take 40 mg by mouth daily.) 90 tablet 3  . Glucose Blood (BLOOD GLUCOSE TEST STRIPS) STRP Use as directed to monitor FSBS 3x weekly.  Dx: R73.09. 50 strip 11  . HYDROcodone-acetaminophen (NORCO) 7.5-325 MG tablet Take 1 tablet by mouth 2 (two) times daily as needed for moderate pain. 60 tablet 0  . Lancets MISC Use as directed to monitor FSBS 3x weekly. Dx: R73.09. 50 each 11  . losartan (COZAAR) 50 MG tablet TAKE 1 TABLET BY MOUTH DAILY (Patient taking differently: Take 50 mg by mouth daily.) 90 tablet 3  . Magnesium 250 MG TABS Take 1 tablet (250 mg total) by mouth daily. (Patient taking differently: Take 250 mg by mouth daily in the afternoon. Midday) 30 tablet 11  . metFORMIN (GLUCOPHAGE) 500 MG tablet Take 1 tablet (500 mg total) by mouth 2 (two) times daily with a meal. 60 tablet 3  . nystatin (MYCOSTATIN/NYSTOP) powder Apply 1 application topically 3 (three) times daily. 60 g 1  . omeprazole (PRILOSEC) 40 MG capsule TAKE 1 CAPSULE(40 MG) BY MOUTH DAILY (Patient taking differently: Take 40 mg by mouth daily.) 90 capsule 3  . OXYGEN Inhale 1 L into the lungs at bedtime. IN ADDITION TO CPAP NIGHTLY    . potassium chloride (KLOR-CON) 10 MEQ tablet TAKE 1 TABLET BY MOUTH DAILY WITH FLUID PILL (Patient taking differently: Take 10 mEq by mouth daily. With fluid pill) 90 tablet 3  . predniSONE (DELTASONE) 10 MG tablet Pt currently on $RemoveBefo'60mg'SOYQpgcgFBt$  daily, start taking $RemoveBefo'50mg'BuhubbcDhrP$  daily for one month, then decrease to $RemoveBef'40mg'MCkMIkagXR$  daily for 1 month, then decrease $RemoveBefor'30mg'IllfLigOZyLu$  daily. (Patient taking differently: Take 30 mg by mouth daily.) 180 tablet 12  . pyridostigmine (MESTINON) 180 MG CR tablet Take 180 mg by mouth daily.    Marland Kitchen pyridostigmine (MESTINON) 60 MG tablet Take 1 tablet (60 mg total) by mouth 3 (three) times daily. 180 tablet 4  . traZODone (DESYREL) 100 MG tablet TAKE 1 TABLET BY MOUTH AT  BEDTIME AS NEEDED FOR SLEEP (Patient taking differently: Take 100 mg by mouth at bedtime as needed for sleep.) 90 tablet 2   No current facility-administered medications for this visit.    No Known Allergies  REVIEW OF SYSTEMS:   '[X]'$  denotes positive finding, $RemoveBeforeDEI'[ ]'CUvJdHHlxBVwbjST$  denotes negative finding Cardiac  Comments:  Chest pain or chest pressure:    Shortness of breath upon exertion:    Short of breath when lying flat:    Irregular heart rhythm:        Vascular    Pain in calf, thigh, or hip brought on by ambulation: x   Pain in feet at night that wakes you up from your sleep:  x   Blood clot in your veins:    Leg swelling:  x       Pulmonary    Oxygen at home:    Productive cough:     Wheezing:         Weakness in legs x       Sudden numbness in arms or legs:     Sudden onset of difficulty speaking or slurred speech:    Temporary loss of vision in one eye:     Problems with dizziness:         Gastrointestinal    Blood in stool:     Vomited blood:         Genitourinary    Burning when urinating:     Blood in urine:        Psychiatric    Major depression:         Hematologic    Bleeding problems:    Problems  with blood clotting too easily:        Skin    Rashes or ulcers: x       Constitutional    Fever or chills:      PHYSICAL EXAMINATION:  Today's Vitals   09/28/20 1351  BP: (!) 120/55  Pulse: 92  Resp: 20  Temp: 98.2 F (36.8 C)  TempSrc: Temporal  SpO2: 99%  Weight: 257 lb (116.6 kg)  Height: 5' 4.5" (1.638 m)  PainSc: 10-Worst pain ever   Body mass index is 43.43 kg/m.   General:  WDWN in NAD; vital signs documented above Gait: Not observed-in wheelchair HENT: WNL, normocephalic Pulmonary: normal non-labored breathing without wheezing Cardiac: regular HR; without carotid bruits Abdomen: obese Skin: without rashes Vascular Exam/Pulses:  Right Left  Radial 2+ (normal) 2+ (normal)  DP monophasic monophasic  PT monophasic monophasic    Extremities: without ischemic changes, without cellulitis; with open wounds;          Musculoskeletal: no muscle wasting or atrophy  Neurologic: A&O X 3;  moving all extremities equally Psychiatric:  The pt has flat affect.   Non-Invasive Vascular Imaging:   Venous duplex on 09/28/2020: Venous Reflux Times  +--------------+---------+------+-----------+------------+-------------+  RIGHT     Reflux NoRefluxReflux TimeDiameter cmsComments                  Yes                      +--------------+---------+------+-----------+------------+-------------+  CFV      no                            +--------------+---------+------+-----------+------------+-------------+  FV mid    no                            +--------------+---------+------+-----------+------------+-------------+  Popliteal   no                            +--------------+---------+------+-----------+------------+-------------+  GSV at [facility]  no               0.51           +--------------+---------+------+-----------+------------+-------------+  GSV prox thighno               0.27           +--------------+---------+------+-----------+------------+-------------+  GSV mid thigh no               0.15           +--------------+---------+------+-----------+------------+-------------+  GSV dist thighno               0.14  out of fascia  +--------------+---------+------+-----------+------------+-------------+  GSV at knee  no               0.17           +--------------+---------+------+-----------+------------+-------------+  GSV prox calf       yes  >500 ms   0.18            +--------------+---------+------+-----------+------------+-------------+  SSV Pop Fossa no               0.19           +--------------+---------+------+-----------+------------+-------------+  SSV prox calf no               0.13           +--------------+---------+------+-----------+------------+-------------+  SSV mid calf no               0.20           +--------------+---------+------+-----------+------------+-------------+     +--------------+---------+------+-----------+------------+-------------+  LEFT     Reflux NoRefluxReflux TimeDiameter cmsComments                  Yes                      +--------------+---------+------+-----------+------------+-------------+  CFV      no                            +--------------+---------+------+-----------+------------+-------------+  FV mid    no                            +--------------+---------+------+-----------+------------+-------------+  Popliteal   no                            +--------------+---------+------+-----------+------------+-------------+  GSV at SFJ        yes  >500 ms   0.67           +--------------+---------+------+-----------+------------+-------------+  GSV prox thighno               0.29           +--------------+---------+------+-----------+------------+-------------+  GSV mid thigh no               0.28  out of fascia  +--------------+---------+------+-----------+------------+-------------+  GSV dist thighno               0.23           +--------------+---------+------+-----------+------------+-------------+  GSV at knee        yes  >500 ms   0.16            +--------------+---------+------+-----------+------------+-------------+  GSV prox calf       yes  >500 ms   0.16           +--------------+---------+------+-----------+------------+-------------+  SSV Pop Fossa       yes  >500 ms   0.33           +--------------+---------+------+-----------+------------+-------------+  SSV prox calf       yes  >500 ms   0.17           +--------------+---------+------+-----------+------------+-------------+  SSV mid calf       yes  >500 ms   0.24           +--------------+---------+------+-----------+------------+-------------+   Summary:  Bilateral:  - No evidence of deep vein thrombosis seen in the lower extremities,  bilaterally, from the common femoral through the popliteal veins.  - No evidence of superficial venous thrombosis in the lower extremities,  bilaterally.    Right:  - Venous reflux is noted in the right greater saphenous vein in the calf.    Left:  - Venous reflux is noted in the left sapheno-femoral junction.  - Venous reflux is noted in the left greater saphenous vein at the knee  and in the calf.  - Venous reflux is noted in the left short saphenous vein.    [name] is a 70 y.o. female who presents with: BLE leg swelling  -bilateral leg swelling.  Her duplex today reveals no DVT in either leg.  She has trivial reflux in the right leg and some reflux in the left  at the GSV at the [facility] as well as the GSV in the calf and SSV.  The vein is not a size that is amendable to laser ablation.   Pt with non healing wounds on the right leg that have been present for 1-2 weeks per pt, but according to Dr. Dorian Heckle notes, she has had them since January and she did try una boots but pt did not tolerate this and she has not been elevating legs high enough.    -discussed with pt about wearing compression.   She has brisk monophasic  doppler signals bilateral feet.  I discussed with Dr. Donzetta Matters and feel she would benefit from the compression.   We will have her return in one week for una boot change and will get ABI at that time-hopeful she will be able to tolerate study.   She has appt at wound care center on 3/25.    -pt does have elevated renal function and hx of heart failure and discussed with pt to contact her PCP this week to at least check labs.  If still elevated, she may need an adjustment of her medications.  She states that her PCP last day is Friday and they have given her a list of doctors but the ones she has called will not take her.  I discussed with her that she should call them this week to be seen prior to her last day for further instruction.   -discussed the importance of leg elevation and how to elevate properly - pt is advised to elevate their legs and a diagram is given to them to demonstrate to lay flat on their back with knees elevated and slightly bent with their feet higher than her knees, which puts their feet higher than their heart for 15 minutes per day.  If they cannot lay flat, advised to lay as flat as possible.   -discussed importance of weight loss  -handout with recommendations given    Leontine Locket, [facility] 09/28/2020 1:06 PM  Clinic MD:  Donzetta Matters

## 2020-09-30 ENCOUNTER — Other Ambulatory Visit: Payer: Self-pay | Admitting: Family Medicine

## 2020-09-30 ENCOUNTER — Other Ambulatory Visit: Payer: Self-pay

## 2020-09-30 DIAGNOSIS — M7989 Other specified soft tissue disorders: Secondary | ICD-10-CM

## 2020-09-30 DIAGNOSIS — M8949 Other hypertrophic osteoarthropathy, multiple sites: Secondary | ICD-10-CM

## 2020-09-30 DIAGNOSIS — M159 Polyosteoarthritis, unspecified: Secondary | ICD-10-CM

## 2020-09-30 MED ORDER — HYDROCODONE-ACETAMINOPHEN 7.5-325 MG PO TABS
1.0000 | ORAL_TABLET | Freq: Two times a day (BID) | ORAL | 0 refills | Status: DC | PRN
Start: 2020-09-30 — End: 2021-02-24

## 2020-09-30 NOTE — Telephone Encounter (Signed)
Patient needs a refill on her hydrocodone.  She is scheduled with Janett Billow for Pt needs OV, ER F/U, recheck kidney function on 10/01/2020

## 2020-10-01 ENCOUNTER — Ambulatory Visit (INDEPENDENT_AMBULATORY_CARE_PROVIDER_SITE_OTHER): Payer: Medicare Other | Admitting: Nurse Practitioner

## 2020-10-01 ENCOUNTER — Encounter: Payer: Self-pay | Admitting: Nurse Practitioner

## 2020-10-01 ENCOUNTER — Other Ambulatory Visit: Payer: Self-pay

## 2020-10-01 VITALS — BP 114/62 | HR 94 | Temp 97.7°F | Ht 64.5 in | Wt 257.0 lb

## 2020-10-01 DIAGNOSIS — R609 Edema, unspecified: Secondary | ICD-10-CM | POA: Diagnosis not present

## 2020-10-01 DIAGNOSIS — N189 Chronic kidney disease, unspecified: Secondary | ICD-10-CM | POA: Diagnosis not present

## 2020-10-01 NOTE — Assessment & Plan Note (Signed)
Chronic.  Compared with pictures in patient's chart from Monday, patient's lower extremities look greatly improved in swelling.  Continue wearing Unna boots until appointment Monday, and continue close follow-up with them.  Continue Lasix at 40 mg daily for now.  BMP with GFR done today.

## 2020-10-01 NOTE — Progress Notes (Signed)
Subjective:    Patient ID: [name], female    DOB: Sep 10, 1950, 70 y.o.   MRN: [id number]  HPI: [name] is a 70 y.o. female presenting for ER follow up.  No other questions or concerns today.  Chief Complaint  Patient presents with  . Follow-up    Recheck on kidney function, pt is fasting   ER FOLLOW UP Time since discharge: ~ 1 week  Hospital/facility: [facility]   Diagnosis: peripheral edema,   Procedures/tests: CMP, CBC, COVID testing  Consultants: Vascular Surgeon, Hospitalist  New medications: none  Discharge instructions:  Follow up with Vascular Surgeon   Status: better  CHRONIC KIDNEY DISEASE CKD status: uncontrolled Medications renally dose: does not know Previous renal evaluation: no Pneumovax:  Up to Date Influenza Vaccine:  Up to Date   Patient saw Vascular on 09/28/2020 and Unna boots were applied.  Venous duplex was also performed which showed no evidence of deep vein thrombosis, but did show some venous reflux.  Her follow up with them is schedule for Monday, 10/05/2020.  No Known Allergies  Outpatient Encounter Medications as of 10/01/2020  Medication Sig  . acetaminophen (TYLENOL) 500 MG tablet Take 1,000 mg by mouth every 6 (six) hours as needed for mild pain.   Marland Kitchen albuterol (VENTOLIN HFA) 108 (90 Base) MCG/ACT inhaler INHALE 2 PUFFS INTO THE LUNGS EVERY 4 HOURS AS NEEDED FOR WHEEZING OR SHORTNESS OF BREATH (Patient taking differently: Inhale 2 puffs into the lungs every 4 (four) hours as needed for wheezing or shortness of breath.)  . allopurinol (ZYLOPRIM) 100 MG tablet TAKE 1 TABLET(100 MG) BY MOUTH DAILY (Patient taking differently: Take 100 mg by mouth daily.)  . aspirin EC 81 MG tablet Take 1 tablet (81 mg total) by mouth every evening.  Marland Kitchen atorvastatin (LIPITOR) 40 MG tablet TAKE 1 TABLET(40 MG) BY MOUTH DAILY AT 6 PM (Patient taking differently: Take 40 mg by mouth daily.)  . Blood Glucose Monitoring Suppl (BLOOD GLUCOSE  SYSTEM PAK) KIT Use as directed to monitor FSBS 3x weekly. Dx: R73.09.  . busPIRone (BUSPAR) 5 MG tablet Take 1 tablet (5 mg total) by mouth 2 (two) times daily. For anxiety  . clotrimazole (LOTRIMIN) 1 % cream Apply 1 application topically 2 (two) times daily.  . diphenhydrAMINE (BENADRYL) 25 MG tablet Take 25 mg by mouth daily.  . fluticasone (CUTIVATE) 0.05 % cream APPLY EXTERNALLY TO THE AFFECTED AREA DAILY  . furosemide (LASIX) 40 MG tablet TAKE 1 TABLET(40 MG) BY MOUTH DAILY (Patient taking differently: Take 40 mg by mouth daily.)  . Glucose Blood (BLOOD GLUCOSE TEST STRIPS) STRP Use as directed to monitor FSBS 3x weekly. Dx: R73.09.  . HYDROcodone-acetaminophen (NORCO) 7.5-325 MG tablet Take 1 tablet by mouth 2 (two) times daily as needed for moderate pain.  . Lancets MISC Use as directed to monitor FSBS 3x weekly. Dx: R73.09.  . losartan (COZAAR) 50 MG tablet TAKE 1 TABLET BY MOUTH DAILY (Patient taking differently: Take 50 mg by mouth daily.)  . Magnesium 250 MG TABS Take 1 tablet (250 mg total) by mouth daily. (Patient taking differently: Take 250 mg by mouth daily in the afternoon. Midday)  . metFORMIN (GLUCOPHAGE) 500 MG tablet Take 1 tablet (500 mg total) by mouth 2 (two) times daily with a meal.  . nystatin (MYCOSTATIN/NYSTOP) powder Apply 1 application topically 3 (three) times daily.  Marland Kitchen omeprazole (PRILOSEC) 40 MG capsule TAKE 1 CAPSULE(40 MG) BY MOUTH DAILY (Patient taking differently: Take  40 mg by mouth daily.)  . OXYGEN Inhale 1 L into the lungs at bedtime. IN ADDITION TO CPAP NIGHTLY  . potassium chloride (KLOR-CON) 10 MEQ tablet TAKE 1 TABLET BY MOUTH DAILY WITH FLUID PILL (Patient taking differently: Take 10 mEq by mouth daily. With fluid pill)  . predniSONE (DELTASONE) 10 MG tablet Pt currently on 90m daily, start taking 561mdaily for one month, then decrease to 4072maily for 1 month, then decrease 61m19mily. (Patient taking differently: Take 30 mg by mouth daily.)  .  pyridostigmine (MESTINON) 180 MG CR tablet Take 180 mg by mouth daily.  . pyMarland Kitchenidostigmine (MESTINON) 60 MG tablet Take 1 tablet (60 mg total) by mouth 3 (three) times daily.  . traZODone (DESYREL) 100 MG tablet TAKE 1 TABLET BY MOUTH AT BEDTIME AS NEEDED FOR SLEEP (Patient taking differently: Take 100 mg by mouth at bedtime as needed for sleep.)   No facility-administered encounter medications on file as of 10/01/2020.    Patient Active Problem List   Diagnosis Date Noted  . Chronic kidney disease 10/01/2020  . 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HCC)[place]/19/2021  . Chronic insomnia 01/08/2020  . Ocular myasthenia gravis (HCC)[place]/24/2021  . Peripheral edema 01/23/2019  . Asthma 05/14/2015  . Heel spur 02/24/2015  . Depression 02/24/2015  . Post-menopausal bleeding 06/02/2014  . OA (osteoarthritis) of knee 04/09/2014  . Epidermal cyst 10/01/2013  . Class 3 obesity 02/25/2013  . Back pain 03/04/2012  . Gout 01/25/2012  . Edema 01/28/2011  . OBESITY HYPOVENTILATION SYNDROME 06/16/2008  . OBSTRUCTIVE SLEEP APNEA 05/08/2008  . Hyperlipidemia 08/28/2006  . Anxiety 08/28/2006  . Essential hypertension 08/28/2006  . GERD 08/28/2006  . Osteoarthritis 08/28/2006    Past Medical History:  Diagnosis Date  . Achilles tendinitis   . Anxiety   . Asthma   . Depression   . GERD (gastroesophageal reflux disease)   . Gout   . H/O myasthenia gravis    left eye  . HTN (hypertension)   . Hyperlipidemia   . Low back pain   . Obesity hypoventilation syndrome (HCC)[place]. Obstructive sleep apnea   . Osteoarthritis     Relevant past medical, surgical, family and social history reviewed and updated as indicated. Interim medical history since our last visit reviewed.  Review of Systems Per HPI unless specificly indicated above     Objective:    BP 114/62   Pulse 94   Temp 97.7 F (36.5 C)   Ht 5' 4.5" (1.638 m)   Wt 257 lb (116.6 kg)   SpO2 96%   BMI 43.43 kg/m   Wt Readings from  Last 3 Encounters:  10/01/20 257 lb (116.6 kg)  09/28/20 257 lb (116.6 kg)  09/24/20 257 lb (116.6 kg)    Physical Exam Vitals and nursing note reviewed.  Constitutional:      General: She is not in acute distress.    Appearance: Normal appearance. She is not toxic-appearing.  HENT:     Head: Normocephalic and atraumatic.  Eyes:     General: No scleral icterus.    Extraocular Movements: Extraocular movements intact.  Cardiovascular:     Rate and Rhythm: Regular rhythm.     Heart sounds: No murmur heard.   Pulmonary:     Effort: Pulmonary effort is normal. No respiratory distress.     Breath sounds: Normal breath sounds. No wheezing, rhonchi or rales.  Abdominal:     General: Abdomen is flat. Bowel sounds are normal.  There is no distension.     Tenderness: There is no right CVA tenderness or left CVA tenderness.  Musculoskeletal:     Comments: Lower extremities wrapped in Unna boots  Skin:    General: Skin is warm and dry.     Capillary Refill: Capillary refill takes less than 2 seconds.     Coloration: Skin is not jaundiced or pale.     Findings: No erythema.  Neurological:     Mental Status: She is alert and oriented to person, place, and time.  Psychiatric:        Mood and Affect: Mood normal.        Behavior: Behavior normal.        Thought Content: Thought content normal.        Judgment: Judgment normal.    Results for orders placed or performed during the hospital encounter of 09/24/20  SARS CORONAVIRUS 2 (TAT 6-24 HRS) Nasopharyngeal Nasopharyngeal Swab   Specimen: Nasopharyngeal Swab  Result Value Ref Range   SARS Coronavirus 2 NEGATIVE NEGATIVE  CBC with Differential/Platelet  Result Value Ref Range   WBC 12.0 (H) 4.0 - 10.5 K/uL   RBC 3.30 (L) 3.87 - 5.11 MIL/uL   Hemoglobin 9.9 (L) 12.0 - 15.0 g/dL   HCT 31.0 (L) 36.0 - 46.0 %   MCV 93.9 80.0 - 100.0 fL   MCH 30.0 26.0 - 34.0 pg   MCHC 31.9 30.0 - 36.0 g/dL   RDW 15.6 (H) 11.5 - 15.5 %   Platelets  340 150 - 400 K/uL   nRBC 0.3 (H) 0.0 - 0.2 %   Neutrophils Relative % 72 %   Neutro Abs 8.7 (H) 1.7 - 7.7 K/uL   Lymphocytes Relative 15 %   Lymphs Abs 1.8 0.7 - 4.0 K/uL   Monocytes Relative 8 %   Monocytes Absolute 0.9 0.1 - 1.0 K/uL   Eosinophils Relative 1 %   Eosinophils Absolute 0.1 0.0 - 0.5 K/uL   Basophils Relative 1 %   Basophils Absolute 0.1 0.0 - 0.1 K/uL   Immature Granulocytes 3 %   Abs Immature Granulocytes 0.35 (H) 0.00 - 0.07 K/uL  Comprehensive metabolic panel  Result Value Ref Range   Sodium 133 (L) 135 - 145 mmol/L   Potassium 4.1 3.5 - 5.1 mmol/L   Chloride 100 98 - 111 mmol/L   CO2 23 22 - 32 mmol/L   Glucose, Bld 108 (H) 70 - 99 mg/dL   BUN 38 (H) 8 - 23 mg/dL   Creatinine, Ser 1.84 (H) 0.44 - 1.00 mg/dL   Calcium 8.2 (L) 8.9 - 10.3 mg/dL   Total Protein 6.2 (L) 6.5 - 8.1 g/dL   Albumin 2.8 (L) 3.5 - 5.0 g/dL   AST 15 15 - 41 U/L   ALT 17 0 - 44 U/L   Alkaline Phosphatase 103 38 - 126 U/L   Total Bilirubin 0.6 0.3 - 1.2 mg/dL   GFR, Estimated 29 (L) >60 mL/min   Anion gap 10 5 - 15      Assessment & Plan:   Problem List Items Addressed This Visit      Genitourinary   Chronic kidney disease - Primary    Chronic.  Medications renally dosed with exception of allopurinol-pending blood work today, may want to consider decreasing allopurinol to 50 mg every other day.  Continue Lasix as previously prescribed-40 mg once daily.  Continue Unna boots and close follow-up with vascular.  If kidney function worsens without explanation,  consider referral to nephrology.  BMP with GFR done today.  Follow-up with new PCP.      Relevant Orders   BASIC METABOLIC PANEL WITH GFR     Other   Peripheral edema    Chronic.  Compared with pictures in patient's chart from Monday, patient's lower extremities look greatly improved in swelling.  Continue wearing Unna boots until appointment Monday, and continue close follow-up with them.  Continue Lasix at 40 mg daily for  now.  BMP with GFR done today.          Follow up plan: Return for with new PCP.

## 2020-10-01 NOTE — Assessment & Plan Note (Addendum)
Chronic.  Medications renally dosed with exception of allopurinol-pending blood work today, may want to consider decreasing allopurinol to 50 mg every other day.  Continue Lasix as previously prescribed-40 mg once daily.  Continue Unna boots and close follow-up with vascular.  If kidney function worsens without explanation, consider referral to nephrology.  BMP with GFR done today.  Follow-up with new PCP.

## 2020-10-01 NOTE — Patient Instructions (Addendum)
F/u with new pcp   Chronic Kidney Disease, Adult Chronic kidney disease is when lasting damage happens to the kidneys slowly over a long time. The kidneys help to:  Make pee (urine).  Make hormones.  Keep the right amount of fluids and chemicals in the body. Most often, this disease does not go away. You must take steps to help keep the kidney damage from getting worse. If steps are not taken, the kidneys might stop working forever. What are the causes?  Diabetes.  High blood pressure.  Diseases that affect the heart and blood vessels.  Other kidney diseases.  Diseases of the body's disease-fighting system.  A problem with the flow of pee.  Infections of the organs that make pee, store it, and take it out of the body.  Swelling or irritation of your blood vessels. What increases the risk?  Getting older.  Having someone in your family who has kidney disease or kidney failure.  Having a disease caused by genes.  Taking medicines often that harm the kidneys.  Being near or having contact with harmful substances.  Being very overweight.  Using tobacco now or in the past. What are the signs or symptoms?  Feeling very tired.  Having a swollen face, legs, ankles, or feet.  Feeling like you may vomit or vomiting.  Not feeling hungry.  Being confused or not able to focus.  Twitches and cramps in the leg muscles or other muscles.  Dry, itchy skin.  A taste of metal in your mouth.  Making less pee, or making more pee.  Shortness of breath.  Trouble sleeping. You may also become anemic or get weak bones. Anemic means there is not enough red blood cells or hemoglobin in your blood. You may get symptoms slowly. You may not notice them until the kidney damage gets very bad. How is this treated? Often, there is no cure for this disease. Treatment can help with symptoms and help keep the disease from getting worse. You may need to:  Avoid alcohol.  Avoid  foods that are high in salt, potassium, phosphorous, and protein.  Take medicines for symptoms and to help control other conditions.  Have dialysis. This treatment gets harmful waste out of your body.  Treat other problems that cause your kidney disease or make it worse. Follow these instructions at home: Medicines  Take over-the-counter and prescription medicines only as told by your doctor.  Do not take any new medicines, vitamins, or supplements unless your doctor says it is okay. Lifestyle  Do not smoke or use any products that contain nicotine or tobacco. If you need help quitting, ask your doctor.  If you drink alcohol: ? Limit how much you use to:  0-1 drink a day for women who are not pregnant.  0-2 drinks a day for men. ? Know how much alcohol is in your drink. In the [place] one drink equals one 12 oz bottle of beer (355 mL), one 5 oz glass of wine (148 mL), or one 1 oz glass of hard liquor (44 mL).  Stay at a healthy weight. If you need help losing weight, ask your doctor.   General instructions  Follow instructions from your doctor about what you cannot eat or drink.  Track your blood pressure at home. Tell your doctor about any changes.  If you have diabetes, track your blood sugar.  Exercise at least 30 minutes a day, 5 days a week.  Keep your shots (vaccinations) up to date.  Keep all follow-up visits.   Where to find more information  American Association of Kidney Patients: BombTimer.gl  National Kidney Foundation: www.kidney.[place]: https://mathis.com/  Life Options: www.lifeoptions.org  Kidney [employer]: www.kidneyschool.org Contact a doctor if:  Your symptoms get worse.  You get new symptoms. Get help right away if:  You get symptoms of end-stage kidney disease. These include: ? Headaches. ? Losing feeling in your hands or feet. ? Easy bruising. ? Having hiccups often. ? Chest pain. ? Shortness of breath. ? Lack of  menstrual periods, in women.  You have a fever.  You make less pee than normal.  You have pain or you bleed when you pee or poop. These symptoms may be an emergency. Get help right away. Call your local emergency services (911 in the [place]  Do not wait to see if the symptoms will go away.  Do not drive yourself to the hospital. Summary  Chronic kidney disease is when lasting damage happens to the kidneys slowly over a long time.  Causes of this disease include diabetes and high blood pressure.  Often, there is no cure for this disease. Treatment can help symptoms and help keep the disease from getting worse.  Treatment may involve lifestyle changes, medicines, and dialysis. This information is not intended to replace advice given to you by your health care provider. Make sure you discuss any questions you have with your health care provider. Document Revised: 10/16/2019 Document Reviewed: 10/16/2019 Elsevier Patient Education  [place].

## 2020-10-02 ENCOUNTER — Emergency Department (HOSPITAL_COMMUNITY): Payer: Medicare Other

## 2020-10-02 ENCOUNTER — Encounter: Payer: Self-pay | Admitting: Nurse Practitioner

## 2020-10-02 ENCOUNTER — Encounter (HOSPITAL_COMMUNITY): Payer: Self-pay | Admitting: Emergency Medicine

## 2020-10-02 ENCOUNTER — Telehealth: Payer: Self-pay | Admitting: Nurse Practitioner

## 2020-10-02 ENCOUNTER — Other Ambulatory Visit: Payer: Self-pay

## 2020-10-02 ENCOUNTER — Inpatient Hospital Stay (HOSPITAL_COMMUNITY)
Admission: EM | Admit: 2020-10-02 | Discharge: 2020-10-06 | DRG: 683 | Disposition: A | Discharge status: Home Health | Payer: Medicare Other | Attending: Internal Medicine | Admitting: Internal Medicine

## 2020-10-02 DIAGNOSIS — R609 Edema, unspecified: Secondary | ICD-10-CM

## 2020-10-02 DIAGNOSIS — Z9981 Dependence on supplemental oxygen: Secondary | ICD-10-CM

## 2020-10-02 DIAGNOSIS — Z6841 Body Mass Index (BMI) 40.0 and over, adult: Secondary | ICD-10-CM | POA: Diagnosis not present

## 2020-10-02 DIAGNOSIS — G4733 Obstructive sleep apnea (adult) (pediatric): Secondary | ICD-10-CM | POA: Diagnosis not present

## 2020-10-02 DIAGNOSIS — F419 Anxiety disorder, unspecified: Secondary | ICD-10-CM | POA: Diagnosis present

## 2020-10-02 DIAGNOSIS — Z7984 Long term (current) use of oral hypoglycemic drugs: Secondary | ICD-10-CM

## 2020-10-02 DIAGNOSIS — F32A Depression, unspecified: Secondary | ICD-10-CM | POA: Diagnosis not present

## 2020-10-02 DIAGNOSIS — Z20822 Contact with and (suspected) exposure to covid-19: Secondary | ICD-10-CM | POA: Diagnosis not present

## 2020-10-02 DIAGNOSIS — D75839 Thrombocytosis, unspecified: Secondary | ICD-10-CM

## 2020-10-02 DIAGNOSIS — E099 Drug or chemical induced diabetes mellitus without complications: Secondary | ICD-10-CM | POA: Diagnosis present

## 2020-10-02 DIAGNOSIS — T380X5A Adverse effect of glucocorticoids and synthetic analogues, initial encounter: Secondary | ICD-10-CM | POA: Diagnosis present

## 2020-10-02 DIAGNOSIS — D72828 Other elevated white blood cell count: Secondary | ICD-10-CM | POA: Diagnosis not present

## 2020-10-02 DIAGNOSIS — E782 Mixed hyperlipidemia: Secondary | ICD-10-CM

## 2020-10-02 DIAGNOSIS — D75838 Other thrombocytosis: Secondary | ICD-10-CM | POA: Diagnosis present

## 2020-10-02 DIAGNOSIS — T380X5S Adverse effect of glucocorticoids and synthetic analogues, sequela: Secondary | ICD-10-CM | POA: Diagnosis not present

## 2020-10-02 DIAGNOSIS — E0922 Drug or chemical induced diabetes mellitus with diabetic chronic kidney disease: Secondary | ICD-10-CM | POA: Diagnosis present

## 2020-10-02 DIAGNOSIS — Z79899 Other long term (current) drug therapy: Secondary | ICD-10-CM

## 2020-10-02 DIAGNOSIS — Z7982 Long term (current) use of aspirin: Secondary | ICD-10-CM

## 2020-10-02 DIAGNOSIS — J45909 Unspecified asthma, uncomplicated: Secondary | ICD-10-CM | POA: Diagnosis present

## 2020-10-02 DIAGNOSIS — E878 Other disorders of electrolyte and fluid balance, not elsewhere classified: Secondary | ICD-10-CM

## 2020-10-02 DIAGNOSIS — E860 Dehydration: Secondary | ICD-10-CM

## 2020-10-02 DIAGNOSIS — N1832 Chronic kidney disease, stage 3b: Secondary | ICD-10-CM | POA: Diagnosis not present

## 2020-10-02 DIAGNOSIS — E669 Obesity, unspecified: Secondary | ICD-10-CM | POA: Diagnosis not present

## 2020-10-02 DIAGNOSIS — E785 Hyperlipidemia, unspecified: Secondary | ICD-10-CM | POA: Diagnosis not present

## 2020-10-02 DIAGNOSIS — R600 Localized edema: Secondary | ICD-10-CM | POA: Diagnosis not present

## 2020-10-02 DIAGNOSIS — N179 Acute kidney failure, unspecified: Principal | ICD-10-CM

## 2020-10-02 DIAGNOSIS — Z87891 Personal history of nicotine dependence: Secondary | ICD-10-CM

## 2020-10-02 DIAGNOSIS — J4520 Mild intermittent asthma, uncomplicated: Secondary | ICD-10-CM | POA: Diagnosis not present

## 2020-10-02 DIAGNOSIS — R627 Adult failure to thrive: Secondary | ICD-10-CM | POA: Diagnosis not present

## 2020-10-02 DIAGNOSIS — E8809 Other disorders of plasma-protein metabolism, not elsewhere classified: Secondary | ICD-10-CM | POA: Diagnosis present

## 2020-10-02 DIAGNOSIS — E0965 Drug or chemical induced diabetes mellitus with hyperglycemia: Secondary | ICD-10-CM | POA: Diagnosis not present

## 2020-10-02 DIAGNOSIS — E871 Hypo-osmolality and hyponatremia: Secondary | ICD-10-CM | POA: Diagnosis not present

## 2020-10-02 DIAGNOSIS — E1165 Type 2 diabetes mellitus with hyperglycemia: Secondary | ICD-10-CM | POA: Diagnosis not present

## 2020-10-02 DIAGNOSIS — G7000 Myasthenia gravis without (acute) exacerbation: Secondary | ICD-10-CM | POA: Diagnosis not present

## 2020-10-02 DIAGNOSIS — N189 Chronic kidney disease, unspecified: Secondary | ICD-10-CM

## 2020-10-02 DIAGNOSIS — R531 Weakness: Secondary | ICD-10-CM | POA: Diagnosis not present

## 2020-10-02 DIAGNOSIS — D72829 Elevated white blood cell count, unspecified: Secondary | ICD-10-CM

## 2020-10-02 DIAGNOSIS — I10 Essential (primary) hypertension: Secondary | ICD-10-CM | POA: Diagnosis present

## 2020-10-02 DIAGNOSIS — N185 Chronic kidney disease, stage 5: Secondary | ICD-10-CM | POA: Diagnosis not present

## 2020-10-02 DIAGNOSIS — K219 Gastro-esophageal reflux disease without esophagitis: Secondary | ICD-10-CM | POA: Diagnosis not present

## 2020-10-02 DIAGNOSIS — I120 Hypertensive chronic kidney disease with stage 5 chronic kidney disease or end stage renal disease: Secondary | ICD-10-CM | POA: Diagnosis present

## 2020-10-02 DIAGNOSIS — E66813 Obesity, class 3: Secondary | ICD-10-CM | POA: Diagnosis present

## 2020-10-02 DIAGNOSIS — I878 Other specified disorders of veins: Secondary | ICD-10-CM | POA: Diagnosis present

## 2020-10-02 LAB — BASIC METABOLIC PANEL
Anion gap: 15 (ref 5–15)
BUN: 74 mg/dL — ABNORMAL HIGH (ref 8–23)
CO2: 21 mmol/L — ABNORMAL LOW (ref 22–32)
Calcium: 8.3 mg/dL — ABNORMAL LOW (ref 8.9–10.3)
Chloride: 94 mmol/L — ABNORMAL LOW (ref 98–111)
Creatinine, Ser: 3.63 mg/dL — ABNORMAL HIGH (ref 0.44–1.00)
GFR, Estimated: 13 mL/min — ABNORMAL LOW (ref >60)
Glucose, Bld: 126 mg/dL — ABNORMAL HIGH (ref 70–99)
Potassium: 4.3 mmol/L (ref 3.5–5.1)
Sodium: 130 mmol/L — ABNORMAL LOW (ref 135–145)

## 2020-10-02 LAB — RESP PANEL BY RT-PCR (FLU A&B, COVID) ARPGX2
Influenza A by PCR: NEGATIVE
Influenza B by PCR: NEGATIVE
SARS Coronavirus 2 by RT PCR: NEGATIVE

## 2020-10-02 LAB — CBC WITH DIFFERENTIAL/PLATELET
Abs Immature Granulocytes: 0.6 10*3/uL — ABNORMAL HIGH (ref 0.00–0.07)
Basophils Absolute: 0.2 10*3/uL — ABNORMAL HIGH (ref 0.0–0.1)
Basophils Relative: 1 %
Eosinophils Absolute: 0.2 10*3/uL (ref 0.0–0.5)
Eosinophils Relative: 2 %
HCT: 32.1 % — ABNORMAL LOW (ref 36.0–46.0)
Hemoglobin: 10.4 g/dL — ABNORMAL LOW (ref 12.0–15.0)
Immature Granulocytes: 5 %
Lymphocytes Relative: 14 %
Lymphs Abs: 1.7 10*3/uL (ref 0.7–4.0)
MCH: 29.9 pg (ref 26.0–34.0)
MCHC: 32.4 g/dL (ref 30.0–36.0)
MCV: 92.2 fL (ref 80.0–100.0)
Monocytes Absolute: 1.1 10*3/uL — ABNORMAL HIGH (ref 0.1–1.0)
Monocytes Relative: 9 %
Neutro Abs: 8.6 10*3/uL — ABNORMAL HIGH (ref 1.7–7.7)
Neutrophils Relative %: 69 %
Platelets: 521 10*3/uL — ABNORMAL HIGH (ref 150–400)
RBC: 3.48 MIL/uL — ABNORMAL LOW (ref 3.87–5.11)
RDW: 16 % — ABNORMAL HIGH (ref 11.5–15.5)
WBC: 12.3 10*3/uL — ABNORMAL HIGH (ref 4.0–10.5)
nRBC: 0.4 % — ABNORMAL HIGH (ref 0.0–0.2)

## 2020-10-02 LAB — BASIC METABOLIC PANEL WITH GFR
BUN/Creatinine Ratio: 16 (calc) (ref 6–22)
BUN: 69 mg/dL — ABNORMAL HIGH (ref 7–25)
CO2: 23 mmol/L (ref 20–32)
Calcium: 8.4 mg/dL — ABNORMAL LOW (ref 8.6–10.4)
Chloride: 95 mmol/L — ABNORMAL LOW (ref 98–110)
Creat: 4.23 mg/dL — ABNORMAL HIGH (ref 0.60–0.93)
GFR, Est African American: 11 mL/min/{1.73_m2} — ABNORMAL LOW (ref >=60)
GFR, Est Non African American: 10 mL/min/{1.73_m2} — ABNORMAL LOW (ref >=60)
Glucose, Bld: 107 mg/dL — ABNORMAL HIGH (ref 65–99)
Potassium: 4.8 mmol/L (ref 3.5–5.3)
Sodium: 130 mmol/L — ABNORMAL LOW (ref 135–146)

## 2020-10-02 MED ORDER — HEPARIN SODIUM (PORCINE) 5000 UNIT/ML IJ SOLN
5000.0000 [IU] | Freq: Three times a day (TID) | INTRAMUSCULAR | Status: DC
  Administered 2020-10-02 – 2020-10-06 (×11): 5000 [IU] via SUBCUTANEOUS
  Filled 2020-10-02 (×11): qty 1

## 2020-10-02 MED ORDER — SODIUM CHLORIDE 0.9 % IV BOLUS
500.0000 mL | Freq: Once | INTRAVENOUS | Status: AC
  Administered 2020-10-02: 500 mL via INTRAVENOUS

## 2020-10-02 MED ORDER — ACETAMINOPHEN 325 MG PO TABS
650.0000 mg | ORAL_TABLET | Freq: Four times a day (QID) | ORAL | Status: DC | PRN
  Administered 2020-10-03 – 2020-10-06 (×9): 650 mg via ORAL
  Filled 2020-10-02 (×9): qty 2

## 2020-10-02 NOTE — ED Provider Notes (Signed)
[facility] EMERGENCY DEPARTMENT Provider Note   CSN: [id number] Arrival date & time: 10/02/20  1912     History Chief Complaint  Patient presents with  . Abnormal Lab    [name] is a 70 y.o. female.  Pt presents to the ED today with abnormal labs.  The pt was seen in the ED on 3/2 and 3/3 for chronic leg swelling.  The had been taking lasix 40 bid and her pcp increased her lasix for 5 days.  She did not have any improvement in her leg swelling.  She has been having difficulty taking care of herself.  The hospitalist was consulted to see her on 3/3 and thought she could go home.  She was referred to wound care and told to resume her normal lasix dose.  She did follow up with her pcp yesterday.  Labs came back showing an elevated Cr.  Her pcp told pt to come to the ED.  Pt said she has not been eating and drinking much.  She has not had any more sob than usual.  Legs continue to be swollen.            Past Medical History:  Diagnosis Date  . Achilles tendinitis   . Anxiety   . Asthma   . Depression   . GERD (gastroesophageal reflux disease)   . Gout   . H/O myasthenia gravis    left eye  . HTN (hypertension)   . Hyperlipidemia   . Low back pain   . Obesity hypoventilation syndrome ([place])   . Obstructive sleep apnea   . Osteoarthritis     Patient Active Problem List   Diagnosis Date Noted  . Acute kidney injury superimposed on CKD ([place]) 10/02/2020  . Chronic kidney disease 10/01/2020  . 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[place]) 05/12/2020  . Chronic insomnia 01/08/2020  . Ocular myasthenia gravis ([place]) 09/18/2019  . Peripheral edema 01/23/2019  . Asthma 05/14/2015  . Heel spur 02/24/2015  . Depression 02/24/2015  . Post-menopausal bleeding 06/02/2014  . OA (osteoarthritis) of knee 04/09/2014  . Epidermal cyst 10/01/2013  . Class 3 obesity 02/25/2013  . Back pain 03/04/2012  . Gout 01/25/2012  . Edema 01/28/2011  . OBESITY HYPOVENTILATION SYNDROME  06/16/2008  . OBSTRUCTIVE SLEEP APNEA 05/08/2008  . Hyperlipidemia 08/28/2006  . Anxiety 08/28/2006  . Essential hypertension 08/28/2006  . GERD 08/28/2006  . Osteoarthritis 08/28/2006    Past Surgical History:  Procedure Laterality Date  . CATARACT EXTRACTION W/PHACO Left 09/17/2015   Procedure: CATARACT EXTRACTION PHACO AND INTRAOCULAR LENS PLACEMENT LEFT EYE cde=8.58;  Surgeon: Tonny Branch, MD;  Location: [facility];  Service: Ophthalmology;  Laterality: Left;  . CATARACT EXTRACTION W/PHACO Right 05/15/2017   Procedure: CATARACT EXTRACTION PHACO AND INTRAOCULAR LENS PLACEMENT (IOC);  Surgeon: Tonny Branch, MD;  Location: [facility];  Service: Ophthalmology;  Laterality: Right;  CDE: 6.34  . COLONOSCOPY N/A 12/25/2013   Procedure: COLONOSCOPY;  Surgeon: Rogene Houston, MD;  Location: [facility] SUITE;  Service: Endoscopy;  Laterality: N/A;  730-rescheduled Ann notified pt  . COLONOSCOPY WITH PROPOFOL N/A 10/04/2019   Procedure: COLONOSCOPY WITH PROPOFOL;  Surgeon: Rogene Houston, MD;  Location: [facility];  Service: Endoscopy;  Laterality: N/A;  815  . CYST EXCISION N/A 09/12/2016   Procedure: EXCISION SEBACEOUS CYST, BACK;  Surgeon: Aviva Signs, MD;  Location: [facility];  Service: General;  Laterality: N/A;  . INTRAOCULAR LENS INSERTION Bilateral 2018   Dr. Tonny Branch   .  POLYPECTOMY  10/04/2019   Procedure: POLYPECTOMY;  Surgeon: Rogene Houston, MD;  Location: [facility];  Service: Endoscopy;;  . TUBAL LIGATION       OB History    Gravida  3   Para  3   Term  3   Preterm      AB      Living  3     SAB      IAB      Ectopic      Multiple      Live Births              Family History  Problem Relation Age of Onset  . Heart disease Mother   . Thyroid disease Mother   . Heart failure Father   . Lung disease Father   . Diabetes Brother   . Crohn's disease Daughter     Social History   Tobacco Use  . Smoking status: Former Smoker    Packs/day: 2.00    Years:  36.00    Pack years: 72.00    Types: Cigarettes    Start date: 06/25/1969    Quit date: 03/25/2005    Years since quitting: 15.5  . Smokeless tobacco: Never Used  Vaping Use  . Vaping Use: Never used  Substance Use Topics  . Alcohol use: No    Alcohol/week: 0.0 standard drinks  . Drug use: No    Home Medications Prior to Admission medications   Medication Sig Start Date End Date Taking? Authorizing Provider  acetaminophen (TYLENOL) 500 MG tablet Take 1,000 mg by mouth every 6 (six) hours as needed for mild pain.    Yes [provider]  albuterol (VENTOLIN HFA) 108 (90 Base) MCG/ACT inhaler INHALE 2 PUFFS INTO THE LUNGS EVERY 4 HOURS AS NEEDED FOR WHEEZING OR SHORTNESS OF BREATH Patient taking differently: Inhale 2 puffs into the lungs every 4 (four) hours as needed for wheezing or shortness of breath. 01/23/20  Yes [place], Modena Nunnery, MD  allopurinol (ZYLOPRIM) 100 MG tablet TAKE 1 TABLET(100 MG) BY MOUTH DAILY Patient taking differently: Take 100 mg by mouth daily. 02/28/20  Yes [place], Modena Nunnery, MD  aspirin EC 81 MG tablet Take 1 tablet (81 mg total) by mouth every evening. 10/05/19  Yes Rehman, Mechele Dawley, MD  atorvastatin (LIPITOR) 40 MG tablet TAKE 1 TABLET(40 MG) BY MOUTH DAILY AT 6 PM Patient taking differently: Take 40 mg by mouth daily. 08/31/20  Yes [place], Modena Nunnery, MD  busPIRone (BUSPAR) 5 MG tablet Take 1 tablet (5 mg total) by mouth 2 (two) times daily. For anxiety 08/12/20  Yes [place], Modena Nunnery, MD  clotrimazole (LOTRIMIN) 1 % cream Apply 1 application topically 2 (two) times daily. 01/08/20  Yes [place], Modena Nunnery, MD  diphenhydrAMINE (BENADRYL) 25 MG tablet Take 25 mg by mouth daily.   Yes [provider]  fluticasone (CUTIVATE) 0.05 % cream APPLY EXTERNALLY TO THE AFFECTED AREA DAILY 08/13/18  Yes [place], Modena Nunnery, MD  furosemide (LASIX) 40 MG tablet TAKE 1 TABLET(40 MG) BY MOUTH DAILY Patient taking differently: Take 40 mg by mouth daily. 09/08/20  Yes [place],  Modena Nunnery, MD  HYDROcodone-acetaminophen (NORCO) 7.5-325 MG tablet Take 1 tablet by mouth 2 (two) times daily as needed for moderate pain. 09/30/20  Yes [place], Modena Nunnery, MD  losartan (COZAAR) 50 MG tablet TAKE 1 TABLET BY MOUTH DAILY Patient taking differently: Take 50 mg by mouth daily. 08/31/20  Yes , Modena Nunnery, MD  Magnesium 250 MG TABS Take 1 tablet (250 mg total) by mouth daily. Patient taking differently: Take 250 mg by mouth daily in the afternoon. Midday 08/16/18  Yes [place], Modena Nunnery, MD  metFORMIN (GLUCOPHAGE) 500 MG tablet Take 1 tablet (500 mg total) by mouth 2 (two) times daily with a meal. 08/12/20  Yes [place], Modena Nunnery, MD  nystatin (MYCOSTATIN/NYSTOP) powder Apply 1 application topically 3 (three) times daily. 01/08/20  Yes [place], Modena Nunnery, MD  omeprazole (PRILOSEC) 40 MG capsule TAKE 1 CAPSULE(40 MG) BY MOUTH DAILY Patient taking differently: Take 40 mg by mouth daily. 06/29/20  Yes [place], Modena Nunnery, MD  OXYGEN Inhale 1 L into the lungs at bedtime. IN ADDITION TO CPAP NIGHTLY   Yes [provider]  potassium chloride (KLOR-CON) 10 MEQ tablet TAKE 1 TABLET BY MOUTH DAILY WITH FLUID PILL Patient taking differently: Take 10 mEq by mouth daily. With fluid pill 09/01/20  Yes [place], Modena Nunnery, MD  predniSONE (DELTASONE) 10 MG tablet Pt currently on 79m daily, start taking 550mdaily for one month, then decrease to 4027maily for 1 month, then decrease 51m22mily. Patient taking differently: Take 30 mg by mouth daily. 07/16/20  Yes [place], RichNanine Means  traZODone (DESYREL) 100 MG tablet TAKE 1 TABLET BY MOUTH AT BEDTIME AS NEEDED FOR SLEEP Patient taking differently: Take 100 mg by mouth at bedtime as needed for sleep. 07/07/20  Yes [place], KawaModena Nunnery  Blood Glucose Monitoring Suppl (BLOOD GLUCOSE SYSTEM PAK) KIT Use as directed to monitor FSBS 3x weekly. Dx: R73.09. 05/25/20   DurhAlycia Rossetti  Glucose Blood (BLOOD GLUCOSE TEST STRIPS) STRP Use as directed to monitor  FSBS 3x weekly. Dx: R73.09. 05/25/20   DurhAlycia Rossetti  Lancets MISC Use as directed to monitor FSBS 3x weekly. Dx: R73.09. 05/25/20   DurhAlycia Rossetti  pyridostigmine (MESTINON) 180 MG CR tablet Take 180 mg by mouth daily. 06/27/20   [provider]  pyridostigmine (MESTINON) 60 MG tablet Take 1 tablet (60 mg total) by mouth 3 (three) times daily. Patient not taking: Reported on 10/02/2020 04/01/20   PenuPenni Bombard    Allergies    Patient has no known allergies.  Review of Systems   Review of Systems  Cardiovascular: Positive for leg swelling.  Neurological: Positive for weakness.  All other systems reviewed and are negative.   Physical Exam Updated Vital Signs BP (!) 114/55   Pulse 97   Temp 98.2 F (36.8 C)   Resp 15   Ht _0  (1.651 m)   Wt 116.6 kg   SpO2 96%   BMI 42.78 kg/m   Physical Exam Vitals and nursing note reviewed.  Constitutional:      Appearance: Normal appearance. She is obese.  HENT:     Head: Normocephalic and atraumatic.     Right Ear: External ear normal.     Left Ear: External ear normal.     Nose: Nose normal.     Mouth/Throat:     Mouth: Mucous membranes are moist.     Pharynx: Oropharynx is clear.  Eyes:     Extraocular Movements: Extraocular movements intact.     Conjunctiva/sclera: Conjunctivae normal.     Pupils: Pupils are equal, round, and reactive to light.  Cardiovascular:     Rate and Rhythm: Normal rate and regular rhythm.     Pulses: Normal pulses.     Heart sounds: Normal heart sounds.  Pulmonary:  Effort: Pulmonary effort is normal.     Breath sounds: Normal breath sounds.  Abdominal:     General: Abdomen is flat. Bowel sounds are normal.     Palpations: Abdomen is soft.  Musculoskeletal:     Cervical back: Normal range of motion and neck supple.     Right lower leg: Edema present.     Left lower leg: Edema present.  Skin:    General: Skin is warm.     Capillary Refill: Capillary refill  takes less than 2 seconds.  Neurological:     General: No focal deficit present.     Mental Status: She is alert and oriented to person, place, and time.  Psychiatric:        Mood and Affect: Mood normal.        Behavior: Behavior normal.        Thought Content: Thought content normal.        Judgment: Judgment normal.     ED Results / Procedures / Treatments   Labs (all labs ordered are listed, but only abnormal results are displayed) Labs Reviewed  BASIC METABOLIC PANEL - Abnormal; Notable for the following components:      Result Value   Sodium 130 (*)    Chloride 94 (*)    CO2 21 (*)    Glucose, Bld 126 (*)    BUN 74 (*)    Creatinine, Ser 3.63 (*)    Calcium 8.3 (*)    GFR, Estimated 13 (*)    All other components within normal limits  CBC WITH DIFFERENTIAL/PLATELET - Abnormal; Notable for the following components:   WBC 12.3 (*)    RBC 3.48 (*)    Hemoglobin 10.4 (*)    HCT 32.1 (*)    RDW 16.0 (*)    Platelets 521 (*)    nRBC 0.4 (*)    Neutro Abs 8.6 (*)    Monocytes Absolute 1.1 (*)    Basophils Absolute 0.2 (*)    Abs Immature Granulocytes 0.60 (*)    All other components within normal limits  RESP PANEL BY RT-PCR (FLU A&B, COVID) [id number]  URINALYSIS, ROUTINE W REFLEX MICROSCOPIC    EKG EKG Interpretation  Date/Time:  Friday October 02 2020 21:20:59 EST Ventricular Rate:  104 PR Interval:    QRS Duration: 115 QT Interval:  322 QTC Calculation: 420 R Axis:   55 Text Interpretation: Normal sinus rhythm Nonspecific intraventricular conduction delay Low voltage, precordial leads Baseline wander in lead(s) V3 Poor data quality No significant change since last tracing Confirmed by Isla Pence [phone]) on 10/02/2020 11:02:43 PM   Radiology DG Chest 2 View  Result Date: 10/02/2020 CLINICAL DATA:  Weakness. EXAM: CHEST - 2 VIEW COMPARISON:  Radiograph 09/23/2020 FINDINGS: Lower lung volumes from prior exam. Upper normal heart size. No focal airspace  disease, pleural effusion, or pneumothorax. Stable osseous structures. IMPRESSION: Low lung volumes without acute findings. Electronically Signed   By: Keith Rake M.D.   On: 10/02/2020 22:56    Procedures Procedures   Medications Ordered in ED Medications  sodium chloride 0.9 % bolus 500 mL (0 mLs Intravenous Stopped 10/02/20 2214)    ED Course  I have reviewed the triage vital signs and the nursing notes.  Pertinent labs & imaging results that were available during my care of the patient were reviewed by me and considered in my medical decision making (see chart for details).    MDM Rules/Calculators/A&P  Cr on 2/23 was 1.25.  Every time it's been repeated, it's been elevated.  Today's Cr is not as bad as yesterday, but it is still elevated with a GFR of 13.  Pt is given gentle hydration.  She has a negative CXR and Covid is negative.  Pt d/w Dr. Josephine Cables (triad) who will admit.  CRITICAL CARE Performed by: Isla Pence   Total critical care time: 30 minutes  Critical care time was exclusive of separately billable procedures and treating other patients.  Critical care was necessary to treat or prevent imminent or life-threatening deterioration.  Critical care was time spent personally by me on the following activities: development of treatment plan with patient and/or surrogate as well as nursing, discussions with consultants, evaluation of patient's response to treatment, examination of patient, obtaining history from patient or surrogate, ordering and performing treatments and interventions, ordering and review of laboratory studies, ordering and review of radiographic studies, pulse oximetry and re-evaluation of patient's condition.   Final Clinical Impression(s) / ED Diagnoses Final diagnoses:  AKI (acute kidney injury) [facility])    Rx / [place] Orders ED Discharge Orders    None       Isla Pence, MD 10/02/20 2304

## 2020-10-02 NOTE — ED Triage Notes (Signed)
Pt was brought up here by daughter, states that her PCP did a kidney function test and her kidney function is elevated. Pt states the doctor thinks she is dehydrated.

## 2020-10-02 NOTE — Telephone Encounter (Addendum)
Placed numerous calls to patient to discuss labs; phone "not accepting calls at this time."  Unable to leave message.  Call placed to emergency contact: daughter, Clara.  Requested Clara alert patient that we are trying to get in contact with her about her labs and to return call to clinic.

## 2020-10-02 NOTE — H&P (Signed)
History and Physical  [name] BDZ:[id number] DOB: Oct 16, 1950 DOA: 10/02/2020  Referring physician: Isla Pence, MD PCP: Alycia Rossetti, MD  Patient coming from: Home  Chief Complaint: Abnormal lab  HPI: [name] is a 70 y.o. female with medical history significant for  hypertension, hyperlipidemia, morbid obesity, GERD, sleep apnea on CPAP at night, chronic peripheral edema-likely secondary to venous insufficiency, steroid-induced Type 2 diabetes mellitus who presents to the emergency department due to worsening kidney function. Patient was seen in the ED on 3/2 and 3/3 due to chronic leg swelling, for which she was discharged home after being seen by hospitalist at that time and referred to wound care.  Her home Lasix was recently increased to 40 twice daily for 5 days by her PCP without improvement in her leg swelling.  Patient had a follow-up with her PCP yesterday during which labs done resulted in an elevated creatinine and she was asked to go to the ED for further evaluation and management.  Of note, patient's oral intake (food and fluid) has decreased.  She denies fever, chills, chest pain, shortness of breath.  She endorsed decreased urinary output.  ED Course: In the emergency department, she was intermittently tachycardic, otherwise, she was hemodynamically stable.  Work-up in the ED showed leukocytosis, thrombocytosis, hyponatremia, hypochloremia.  BUN/creatinine 69/4.23 (baseline creatinine at 1.30).  SARS coronavirus 2 was negative. Chest x-ray showed low lung volumes without acute findings. IV hydration with IV NS 500 mL was given.  Hospitalist was asked to admit patient for further evaluation and management.  Review of Systems: Constitutional: Negative for chills and fever.  HENT: Negative for ear pain and sore throat.   Eyes: Negative for pain and visual disturbance.  Respiratory: Negative for cough, chest tightness and shortness of breath.    Cardiovascular: Negative for chest pain and palpitations.  Gastrointestinal: Negative for abdominal pain and vomiting.  Endocrine: Negative for polyphagia and polyuria.  Genitourinary: Positive for decreased urine volume,  negative for dysuria, enuresis, hematuria Musculoskeletal:  Positive for bilateral leg swelling. Negative for arthralgias and back pain.  Skin: Negative for color change and rash.  Allergic/Immunologic: Negative for immunocompromised state.  Neurological: Positive for weakness and headache.  Negative for tremors, syncope, speech difficulty Hematological: Does not bruise/bleed easily.  All other systems reviewed and are negative   Past Medical History:  Diagnosis Date  . Achilles tendinitis   . Anxiety   . Asthma   . Depression   . GERD (gastroesophageal reflux disease)   . Gout   . H/O myasthenia gravis    left eye  . HTN (hypertension)   . Hyperlipidemia   . Low back pain   . Obesity hypoventilation syndrome ([place])   . Obstructive sleep apnea   . Osteoarthritis    Past Surgical History:  Procedure Laterality Date  . CATARACT EXTRACTION W/PHACO Left 09/17/2015   Procedure: CATARACT EXTRACTION PHACO AND INTRAOCULAR LENS PLACEMENT LEFT EYE cde=8.58;  Surgeon: Tonny Branch, MD;  Location: [facility];  Service: Ophthalmology;  Laterality: Left;  . CATARACT EXTRACTION W/PHACO Right 05/15/2017   Procedure: CATARACT EXTRACTION PHACO AND INTRAOCULAR LENS PLACEMENT (IOC);  Surgeon: Tonny Branch, MD;  Location: [facility];  Service: Ophthalmology;  Laterality: Right;  CDE: 6.34  . COLONOSCOPY N/A 12/25/2013   Procedure: COLONOSCOPY;  Surgeon: Rogene Houston, MD;  Location: [facility];  Service: Endoscopy;  Laterality: N/A;  730-rescheduled Ann notified pt  . COLONOSCOPY WITH PROPOFOL N/A 10/04/2019   Procedure: COLONOSCOPY WITH  PROPOFOL;  Surgeon: Rogene Houston, MD;  Location: [facility];  Service: Endoscopy;  Laterality: N/A;  815  . CYST EXCISION N/A 09/12/2016    Procedure: EXCISION SEBACEOUS CYST, BACK;  Surgeon: Aviva Signs, MD;  Location: [facility];  Service: General;  Laterality: N/A;  . INTRAOCULAR LENS INSERTION Bilateral 2018   Dr. Tonny Branch   . POLYPECTOMY  10/04/2019   Procedure: POLYPECTOMY;  Surgeon: Rogene Houston, MD;  Location: [facility];  Service: Endoscopy;;  . TUBAL LIGATION      Social History:  reports that she quit smoking about 15 years ago. Her smoking use included cigarettes. She started smoking about 51 years ago. She has a 72.00 pack-year smoking history. She has never used smokeless tobacco. She reports that she does not drink alcohol and does not use drugs.   No Known Allergies  Family History  Problem Relation Age of Onset  . Heart disease Mother   . Thyroid disease Mother   . Heart failure Father   . Lung disease Father   . Diabetes Brother   . Crohn's disease Daughter      Prior to Admission medications   Medication Sig Start Date End Date Taking? Authorizing Provider  acetaminophen (TYLENOL) 500 MG tablet Take 1,000 mg by mouth every 6 (six) hours as needed for mild pain.    Yes [provider]  albuterol (VENTOLIN HFA) 108 (90 Base) MCG/ACT inhaler INHALE 2 PUFFS INTO THE LUNGS EVERY 4 HOURS AS NEEDED FOR WHEEZING OR SHORTNESS OF BREATH Patient taking differently: Inhale 2 puffs into the lungs every 4 (four) hours as needed for wheezing or shortness of breath. 01/23/20  Yes [place], Modena Nunnery, MD  allopurinol (ZYLOPRIM) 100 MG tablet TAKE 1 TABLET(100 MG) BY MOUTH DAILY Patient taking differently: Take 100 mg by mouth daily. 02/28/20  Yes [place], Modena Nunnery, MD  aspirin EC 81 MG tablet Take 1 tablet (81 mg total) by mouth every evening. 10/05/19  Yes Rehman, Mechele Dawley, MD  atorvastatin (LIPITOR) 40 MG tablet TAKE 1 TABLET(40 MG) BY MOUTH DAILY AT 6 PM Patient taking differently: Take 40 mg by mouth daily. 08/31/20  Yes [place], Modena Nunnery, MD  busPIRone (BUSPAR) 5 MG tablet Take 1 tablet (5 mg total) by mouth  2 (two) times daily. For anxiety 08/12/20  Yes [place], Modena Nunnery, MD  clotrimazole (LOTRIMIN) 1 % cream Apply 1 application topically 2 (two) times daily. 01/08/20  Yes [place], Modena Nunnery, MD  diphenhydrAMINE (BENADRYL) 25 MG tablet Take 25 mg by mouth daily.   Yes [provider]  fluticasone (CUTIVATE) 0.05 % cream APPLY EXTERNALLY TO THE AFFECTED AREA DAILY 08/13/18  Yes [place], Modena Nunnery, MD  furosemide (LASIX) 40 MG tablet TAKE 1 TABLET(40 MG) BY MOUTH DAILY Patient taking differently: Take 40 mg by mouth daily. 09/08/20  Yes [place], Modena Nunnery, MD  HYDROcodone-acetaminophen (NORCO) 7.5-325 MG tablet Take 1 tablet by mouth 2 (two) times daily as needed for moderate pain. 09/30/20  Yes [place], Modena Nunnery, MD  losartan (COZAAR) 50 MG tablet TAKE 1 TABLET BY MOUTH DAILY Patient taking differently: Take 50 mg by mouth daily. 08/31/20  Yes [place], Modena Nunnery, MD  Magnesium 250 MG TABS Take 1 tablet (250 mg total) by mouth daily. Patient taking differently: Take 250 mg by mouth daily in the afternoon. Midday 08/16/18  Yes , Modena Nunnery, MD  metFORMIN (GLUCOPHAGE) 500 MG tablet Take 1 tablet (500 mg total) by mouth 2 (two) times daily  with a meal. 08/12/20  Yes [place], Modena Nunnery, MD  nystatin (MYCOSTATIN/NYSTOP) powder Apply 1 application topically 3 (three) times daily. 01/08/20  Yes [place], Modena Nunnery, MD  omeprazole (PRILOSEC) 40 MG capsule TAKE 1 CAPSULE(40 MG) BY MOUTH DAILY Patient taking differently: Take 40 mg by mouth daily. 06/29/20  Yes [place], Modena Nunnery, MD  OXYGEN Inhale 1 L into the lungs at bedtime. IN ADDITION TO CPAP NIGHTLY   Yes [provider]  potassium chloride (KLOR-CON) 10 MEQ tablet TAKE 1 TABLET BY MOUTH DAILY WITH FLUID PILL Patient taking differently: Take 10 mEq by mouth daily. With fluid pill 09/01/20  Yes [place], Modena Nunnery, MD  predniSONE (DELTASONE) 10 MG tablet Pt currently on 6m daily, start taking 572mdaily for one month, then decrease to 4047maily for 1  month, then decrease 42m24mily. Patient taking differently: Take 30 mg by mouth daily. 07/16/20  Yes [place], RichNanine Means  traZODone (DESYREL) 100 MG tablet TAKE 1 TABLET BY MOUTH AT BEDTIME AS NEEDED FOR SLEEP Patient taking differently: Take 100 mg by mouth at bedtime as needed for sleep. 07/07/20  Yes [place], KawaModena Nunnery  Blood Glucose Monitoring Suppl (BLOOD GLUCOSE SYSTEM PAK) KIT Use as directed to monitor FSBS 3x weekly. Dx: R73.09. 05/25/20   DurhAlycia Rossetti  Glucose Blood (BLOOD GLUCOSE TEST STRIPS) STRP Use as directed to monitor FSBS 3x weekly. Dx: R73.09. 05/25/20   DurhAlycia Rossetti  Lancets MISC Use as directed to monitor FSBS 3x weekly. Dx: R73.09. 05/25/20   DurhAlycia Rossetti  pyridostigmine (MESTINON) 180 MG CR tablet Take 180 mg by mouth daily. 06/27/20   [provider]  pyridostigmine (MESTINON) 60 MG tablet Take 1 tablet (60 mg total) by mouth 3 (three) times daily. Patient not taking: Reported on 10/02/2020 04/01/20   PenuPenni Bombard    Physical Exam: BP (!) 92/40 (BP Location: Left Wrist)   Pulse 97   Temp 98.4 F (36.9 C) (Oral)   Resp 20   Ht 5' 4.5" (1.638 m)   Wt 113.1 kg   SpO2 99%   BMI 42.14 kg/m   . General: 69 y33. year-old, obese female in no acute distress.  Alert and oriented x3. . HEMarland KitchenNT: Dry mucous membrane.  NCAT, EOMI . Neck: Supple, trachea medial . Cardiovascular: Regular rate and rhythm with no rubs or gallops.  No thyromegaly or JVD noted. . ReMarland Kitchenpiratory: Clear to auscultation with no wheezes or rales. Good inspiratory effort. . Abdomen: Soft nontender nondistended with normal bowel sounds x4 quadrants. . Muskuloskeletal: Bilateral lower extremity edema with legs wrapped in Ace bandage.  No cyanosis or clubbing . Neuro: CN II-XII intact, strength 5/5 x 4, sensation, reflexes intact . Skin: No ulcerative lesions noted or rashes . Psychiatry: Judgement and insight appear normal. Mood is appropriate for condition and  setting          Labs on Admission:  Basic Metabolic Panel: Recent Labs  Lab 10/01/20 1204 10/02/20 2052  NA 130* 130*  K 4.8 4.3  CL 95* 94*  CO2 23 21*  GLUCOSE 107* 126*  BUN 69* 74*  CREATININE 4.23* 3.63*  CALCIUM 8.4* 8.3*   Liver Function Tests: No results for input(s): AST, ALT, ALKPHOS, BILITOT, PROT, ALBUMIN in the last 168 hours. No results for input(s): LIPASE, AMYLASE in the last 168 hours. No results for input(s): AMMONIA in the last 168 hours. CBC: Recent Labs  Lab 10/02/20 2052  WBC  12.3*  NEUTROABS 8.6*  HGB 10.4*  HCT 32.1*  MCV 92.2  PLT 521*   Cardiac Enzymes: No results for input(s): CKTOTAL, CKMB, CKMBINDEX, TROPONINI in the last 168 hours.  BNP (last 3 results) Recent Labs    09/23/20 1135  BNP 36.0    ProBNP (last 3 results) No results for input(s): PROBNP in the last 8760 hours.  CBG: No results for input(s): GLUCAP in the last 168 hours.  Radiological Exams on Admission: DG Chest 2 View  Result Date: 10/02/2020 CLINICAL DATA:  Weakness. EXAM: CHEST - 2 VIEW COMPARISON:  Radiograph 09/23/2020 FINDINGS: Lower lung volumes from prior exam. Upper normal heart size. No focal airspace disease, pleural effusion, or pneumothorax. Stable osseous structures. IMPRESSION: Low lung volumes without acute findings. Electronically Signed   By: Keith Rake M.D.   On: 10/02/2020 22:56    EKG: I independently viewed the EKG done and my findings are as followed: Normal sinus rhythm at a rate of 93 bpm   Assessment/Plan Present on Admission: . Acute kidney injury superimposed on CKD ([place]) . OBSTRUCTIVE SLEEP APNEA . Essential hypertension . GERD . Class 3 obesity . 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[place]) . Hyperlipidemia . Edema . Asthma . Ocular myasthenia gravis ([place])  Principal Problem:   Acute kidney injury superimposed on CKD ([place]) Active Problems:   Hyperlipidemia   OBSTRUCTIVE SLEEP APNEA   Essential hypertension   GERD    Edema   Class 3 obesity   Asthma   Ocular myasthenia gravis ([facility])   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[facility])   Dehydration   Leukocytosis   Thrombocytosis   Hyponatremia   Hypochloremia   Failure to thrive in adult  AKI on CKD stage V BUN/creatinine 69/4.23 (baseline creatinine at 1.30), creatinine was 1.84 about 9 days ago Continue IV hydration Renally adjust medications, avoid nephrotoxic agents/dehydration/hypotension Lasix will be held at this time Consider nephrology consult for worsening of symptoms  Leukocytosis/thrombocytosis possibly reactive WBC 12.3, platelets 521 These may be due to hemoconcentration Continue to monitor CBC with morning labs  Failure to thrive in adult Patient has had decreased oral intake with possibility of contrast of patient's worsening kidney function Continue IV hydration Protein supplement be provided Consider dietary consult for worsening of symptoms  Hyponatremic hypochloremia possibly due to dehydration Na 130, Cl 94 Continue IV hydration Continue to monitor sodium with serial BMPs Urine and serum osmolality and urine sodium will be checked  Steroid-induced T2DM Continue ISS and hypoglycemic protocol Metformin will be held at this time  Peripheral edema with reported blister wound(s) This is possibly related to patient's history of chronic venous insufficiency Lower extremity wrapped in Ace bandage Continue wound care  Essential hypertension BP meds will be held at this time due to soft BP  Hyperlipidemia Continue Lipitor  OSA on CPAP Continue CPAP  Asthma (not in acute exacerbation) Continue Ventolin  Class III obesity (BMI 42.78) Patient will be counseled on diet and lifestyle education when more stable  Ocular myasthenia gravis Continue prednisone   DVT prophylaxis: Heparin subcu  Code Status: Full code  Family Communication: None at bedside  Disposition Plan:  Patient is from:                         home Anticipated DC to:                   SNF or family members home Anticipated DC date:  2-3 days Anticipated DC barriers:           Patient is unstable to be discharged at this time due to acute kidney injury on CKD currently at stage V.   Consults called: None  Admission status: Inpatient    Bernadette Hoit MD [facility]  10/03/2020, 4:27 AM

## 2020-10-02 NOTE — Telephone Encounter (Signed)
Calls continued to be placed to patient this afternoon without success.  Spoke with Emergency Contact; [name] - daughter.  Strongly advised she take her mother to the Emergency Room ASAP for her kidney function.  Without treatment over the weekend, her kidney function will continue to decline.  She will likely need to stop the Lasix and Metformin completely.

## 2020-10-02 NOTE — ED Notes (Signed)
Patient transported to X-ray 

## 2020-10-03 DIAGNOSIS — R627 Adult failure to thrive: Secondary | ICD-10-CM

## 2020-10-03 DIAGNOSIS — N179 Acute kidney failure, unspecified: Principal | ICD-10-CM

## 2020-10-03 DIAGNOSIS — I10 Essential (primary) hypertension: Secondary | ICD-10-CM

## 2020-10-03 DIAGNOSIS — E669 Obesity, unspecified: Secondary | ICD-10-CM

## 2020-10-03 DIAGNOSIS — E1165 Type 2 diabetes mellitus with hyperglycemia: Secondary | ICD-10-CM

## 2020-10-03 DIAGNOSIS — G7000 Myasthenia gravis without (acute) exacerbation: Secondary | ICD-10-CM

## 2020-10-03 DIAGNOSIS — E878 Other disorders of electrolyte and fluid balance, not elsewhere classified: Secondary | ICD-10-CM

## 2020-10-03 DIAGNOSIS — E860 Dehydration: Secondary | ICD-10-CM

## 2020-10-03 DIAGNOSIS — E871 Hypo-osmolality and hyponatremia: Secondary | ICD-10-CM

## 2020-10-03 DIAGNOSIS — D72829 Elevated white blood cell count, unspecified: Secondary | ICD-10-CM

## 2020-10-03 DIAGNOSIS — N1832 Chronic kidney disease, stage 3b: Secondary | ICD-10-CM

## 2020-10-03 DIAGNOSIS — D75839 Thrombocytosis, unspecified: Secondary | ICD-10-CM

## 2020-10-03 LAB — COMPREHENSIVE METABOLIC PANEL
ALT: 14 U/L (ref 0–44)
AST: 12 U/L — ABNORMAL LOW (ref 15–41)
Albumin: 2.5 g/dL — ABNORMAL LOW (ref 3.5–5.0)
Alkaline Phosphatase: 98 U/L (ref 38–126)
Anion gap: 10 (ref 5–15)
BUN: 71 mg/dL — ABNORMAL HIGH (ref 8–23)
CO2: 23 mmol/L (ref 22–32)
Calcium: 8.3 mg/dL — ABNORMAL LOW (ref 8.9–10.3)
Chloride: 99 mmol/L (ref 98–111)
Creatinine, Ser: 3 mg/dL — ABNORMAL HIGH (ref 0.44–1.00)
GFR, Estimated: 16 mL/min — ABNORMAL LOW (ref >60)
Glucose, Bld: 95 mg/dL (ref 70–99)
Potassium: 4.3 mmol/L (ref 3.5–5.1)
Sodium: 132 mmol/L — ABNORMAL LOW (ref 135–145)
Total Bilirubin: 0.6 mg/dL (ref 0.3–1.2)
Total Protein: 5.9 g/dL — ABNORMAL LOW (ref 6.5–8.1)

## 2020-10-03 LAB — GLUCOSE, CAPILLARY
Glucose-Capillary: 103 mg/dL — ABNORMAL HIGH (ref 70–99)
Glucose-Capillary: 113 mg/dL — ABNORMAL HIGH (ref 70–99)
Glucose-Capillary: 139 mg/dL — ABNORMAL HIGH (ref 70–99)
Glucose-Capillary: 152 mg/dL — ABNORMAL HIGH (ref 70–99)

## 2020-10-03 LAB — URINALYSIS, COMPLETE (UACMP) WITH MICROSCOPIC
Bacteria, UA: NONE SEEN
Bilirubin Urine: NEGATIVE
Glucose, UA: NEGATIVE mg/dL
Hgb urine dipstick: NEGATIVE
Ketones, ur: NEGATIVE mg/dL
Leukocytes,Ua: NEGATIVE
Nitrite: NEGATIVE
Protein, ur: NEGATIVE mg/dL
Specific Gravity, Urine: 1.01 (ref 1.005–1.030)
pH: 5 (ref 5.0–8.0)

## 2020-10-03 LAB — PROTEIN / CREATININE RATIO, URINE
Creatinine, Urine: 142.63 mg/dL
Protein Creatinine Ratio: 0.06 mg/mg{Cre} (ref 0.00–0.15)
Total Protein, Urine: 9 mg/dL

## 2020-10-03 LAB — CBC
HCT: 28.8 % — ABNORMAL LOW (ref 36.0–46.0)
Hemoglobin: 9.3 g/dL — ABNORMAL LOW (ref 12.0–15.0)
MCH: 30.2 pg (ref 26.0–34.0)
MCHC: 32.3 g/dL (ref 30.0–36.0)
MCV: 93.5 fL (ref 80.0–100.0)
Platelets: 505 10*3/uL — ABNORMAL HIGH (ref 150–400)
RBC: 3.08 MIL/uL — ABNORMAL LOW (ref 3.87–5.11)
RDW: 16 % — ABNORMAL HIGH (ref 11.5–15.5)
WBC: 11.2 10*3/uL — ABNORMAL HIGH (ref 4.0–10.5)
nRBC: 0.4 % — ABNORMAL HIGH (ref 0.0–0.2)

## 2020-10-03 LAB — APTT: aPTT: 44 seconds — ABNORMAL HIGH (ref 24–36)

## 2020-10-03 LAB — PROTIME-INR
INR: 1.1 (ref 0.8–1.2)
Prothrombin Time: 13.4 seconds (ref 11.4–15.2)

## 2020-10-03 LAB — PHOSPHORUS: Phosphorus: 3.6 mg/dL (ref 2.5–4.6)

## 2020-10-03 LAB — SODIUM, URINE, RANDOM: Sodium, Ur: <10 mmol/L

## 2020-10-03 LAB — OSMOLALITY, URINE: Osmolality, Ur: 302 mOsm/kg (ref 300–900)

## 2020-10-03 LAB — CK: Total CK: 20 U/L — ABNORMAL LOW (ref 38–234)

## 2020-10-03 LAB — MAGNESIUM: Magnesium: 1.5 mg/dL — ABNORMAL LOW (ref 1.7–2.4)

## 2020-10-03 LAB — OSMOLALITY: Osmolality: 302 mOsm/kg — ABNORMAL HIGH (ref 275–295)

## 2020-10-03 MED ORDER — INSULIN ASPART 100 UNIT/ML ~~LOC~~ SOLN
0.0000 [IU] | Freq: Three times a day (TID) | SUBCUTANEOUS | Status: DC
  Administered 2020-10-03 – 2020-10-04 (×3): 1 [IU] via SUBCUTANEOUS
  Administered 2020-10-05: 2 [IU] via SUBCUTANEOUS
  Administered 2020-10-06: 1 [IU] via SUBCUTANEOUS

## 2020-10-03 MED ORDER — SODIUM CHLORIDE 0.9 % IV SOLN: Freq: Once | INTRAVENOUS | Status: AC

## 2020-10-03 MED ORDER — ALBUTEROL SULFATE HFA 108 (90 BASE) MCG/ACT IN AERS: 2.0000 | INHALATION_SPRAY | RESPIRATORY_TRACT | Status: DC | PRN

## 2020-10-03 MED ORDER — OXYCODONE HCL 5 MG PO TABS
5.0000 mg | ORAL_TABLET | Freq: Once | ORAL | Status: AC
  Administered 2020-10-03: 5 mg via ORAL
  Filled 2020-10-03: qty 1

## 2020-10-03 MED ORDER — INSULIN ASPART 100 UNIT/ML ~~LOC~~ SOLN: 0.0000 [IU] | Freq: Every day | SUBCUTANEOUS | Status: DC

## 2020-10-03 MED ORDER — PREDNISONE 20 MG PO TABS
30.0000 mg | ORAL_TABLET | Freq: Every day | ORAL | Status: DC
  Administered 2020-10-03 – 2020-10-06 (×4): 30 mg via ORAL
  Filled 2020-10-03 (×4): qty 2

## 2020-10-03 MED ORDER — PANTOPRAZOLE SODIUM 40 MG PO TBEC
40.0000 mg | DELAYED_RELEASE_TABLET | Freq: Every day | ORAL | Status: DC
  Administered 2020-10-03 – 2020-10-06 (×4): 40 mg via ORAL
  Filled 2020-10-03 (×4): qty 1

## 2020-10-03 MED ORDER — SODIUM CHLORIDE 0.9 % IV SOLN: INTRAVENOUS | Status: AC

## 2020-10-03 MED ORDER — ATORVASTATIN CALCIUM 40 MG PO TABS
40.0000 mg | ORAL_TABLET | Freq: Every day | ORAL | Status: DC
  Administered 2020-10-03 – 2020-10-06 (×4): 40 mg via ORAL
  Filled 2020-10-03 (×4): qty 1

## 2020-10-03 MED ORDER — MAGNESIUM SULFATE IN D5W 1-5 GM/100ML-% IV SOLN
1.0000 g | Freq: Once | INTRAVENOUS | Status: AC
  Administered 2020-10-03: 1 g via INTRAVENOUS
  Filled 2020-10-03 (×2): qty 100

## 2020-10-03 MED ORDER — PYRIDOSTIGMINE BROMIDE 60 MG PO TABS
60.0000 mg | ORAL_TABLET | Freq: Three times a day (TID) | ORAL | Status: DC
  Administered 2020-10-03 – 2020-10-06 (×9): 60 mg via ORAL
  Filled 2020-10-03 (×9): qty 1

## 2020-10-03 NOTE — Progress Notes (Signed)
MD notified of pt reported pain to bilateral feet. PRN medication given at 2017 (see MAR) with no relief. Awaiting new orders.

## 2020-10-03 NOTE — Progress Notes (Signed)
Placed on CPAP 8 , 1 liter oxygen , medium mask.

## 2020-10-03 NOTE — Progress Notes (Signed)
PROGRESS NOTE  [name] CHE:[id number] DOB: [birth year] DOA: 10/02/2020 PCP: Alycia Rossetti, MD  Brief History:  70 year old female with history of hypertension, hyperlipidemia, OSA on CPAP, ocular myasthenia, diabetes mellitus type 2, and peripheral edema presenting at the instruction of her primary care provider secondary to worsening serum creatinine.  Unfortunately, the patient is not a great historian.  Much of the history is obtained from review of the medical record.  Apparently, the patient has struggled with lower extremity edema at least since December 2021.  She has been on and off of furosemide 40 mg daily since that period of time.  She has also been tried on [employer] on and off with difficult tolerance secondary to pain.  The patient was seen in the emergency department on 09/23/2020 secondary to lower extremity edema.  She was seen by the hospitalist at that time who felt the patient was stable for discharge home on furosemide 40 mg daily.  She return to the emergency department once again on 09/24/2020 because of continued swelling in her legs and because of the fluid blisters on her legs had ruptured.  The patient once again was evaluated by hospitalist who felt the patient was stable for discharge home.  In the interim, the patient saw vascular surgery on 09/28/2020 in the office.  Ultrasound of her legs at that time was negative for DVT but showed right venous reflux in the right greater saphenous vein in the calf.  There was also venous reflux in the left saphenofemoral junction, left greater saphenous vein, and left short saphenous vein.  Unna boots were placed on the patient and the patient was instructed to return to the office in 1 week.  Nevertheless, it appears that the patient was told to increase her furosemide to twice daily for approximately the last 5 days.  She followed up with her PCP on 10/01/2020.  Routine blood work showed increasing serum creatinine, and the  patient was instructed to come to the emergency department for further evaluation. The patient herself denies any fevers, chills, chest pain, shortness breath, nausea, vomiting or diarrhea, dysuria, hematuria.  He complains of her legs having some pain.  She denies any new medications.  She states that her appetite has been poor.  She has had some intermittent suprapubic discomfort. In the emergency department, the patient was noted to have serum creatinine of 4.23 and sodium 130.  LFTs unremarkable.  WBC 12.3, hemoglobin 12.4, platelets 501,000.  The patient was admitted for further evaluation and treatment of her acute on chronic renal failure.   Assessment/Plan: Acute on chronic renal failure--CKD stage IIIb -Baseline creatinine 1.2-1.5 -Secondary to volume depletion/hemodynamic change -Renal ultrasound -Holding furosemide -Judicious IV fluid -Obtain urine protein creatinine ratio -Obtain urinalysis  Hyponatremia -Secondary to volume depletion and poor solute intake -Judicious IV fluids  Uncontrolled diabetes mellitus type 2 with hyperglycemia -09/16/2020 hemoglobin A1c 8.7 -Holding Metformin -NovoLog sliding scale  Chronic venous stasis/Leg edema -multifactorial including venous stasis, hypoalbuminemia, steroids -check urine protein/creatinine ratio -09/28/20 venous duplex neg DVT, positive venous reflux bilateral -continue UNNA boots  Hypomagnesemia -Replete  Hypertension -Holding losartan secondary to AKI and soft blood pressure  Hyperlipidemia -Holding statin for now -Check CPK  Ocular myasthenia -Continue Mestinon 60 mg 3 times daily as of 6 months ago -Restart prednisone  Hypomagnesemia -Replete  Anxiety/depression -Continue BuSpar  Thrombocytosis -check iron studies  Morbid Obesity -BMI 42.14 -lifestyle modification   Status is:  Inpatient  Remains inpatient appropriate because:IV treatments appropriate due to intensity of illness or inability to take  PO   Dispo: The patient is from: Home              Anticipated d/c is to: Home              Patient currently is not medically stable to d/c.   Difficult to place patient No        Family Communication:  no Family at bedside  Consultants:  none  Code Status:  FULL   DVT Prophylaxis:  [place] Heparin    Procedures: As Listed in Progress Note Above  Antibiotics: None      Subjective: Patient denies fevers, chills, headache, chest pain, dyspnea, nausea, vomiting, diarrhea, abdominal pain, dysuria, hematuria, hematochezia, and melena.   Objective: Vitals:   10/03/20 0100 10/03/20 0208 10/03/20 0644 10/03/20 1100  BP: 101/65 (!) 92/40 138/73 (!) 135/53  Pulse: 98 97 99 90  Resp: 14 20 18 18   Temp:  98.4 F (36.9 C) 98.3 F (36.8 C) 98.2 F (36.8 C)  TempSrc:  Oral Oral Oral  SpO2: 96% 99% 99%   Weight:  113.1 kg    Height:  5' 4.5" (1.638 m)      Intake/Output Summary (Last 24 hours) at 10/03/2020 1455 Last data filed at 10/03/2020 0300 Gross per 24 hour  Intake 570 ml  Output --  Net 570 ml   Weight change:  Exam:   General:  Pt is alert, follows commands appropriately, not in acute distress  HEENT: No icterus, No thrush, No neck mass, [employer]/AT  Cardiovascular: RRR, S1/S2, no rubs, no gallops  Respiratory: CTA bilaterally, no wheezing, no crackles, no rhonchi  Abdomen: Soft/+BS, non tender, non distended, no guarding  Extremities: No edema, No lymphangitis, No petechiae, No rashes, no synovitis   Data Reviewed: I have personally reviewed following labs and imaging studies Basic Metabolic Panel: Recent Labs  Lab 10/01/20 1204 10/02/20 2052 10/03/20 0448  NA 130* 130* 132*  K 4.8 4.3 4.3  CL 95* 94* 99  CO2 23 21* 23  GLUCOSE 107* 126* 95  BUN 69* 74* 71*  CREATININE 4.23* 3.63* 3.00*  CALCIUM 8.4* 8.3* 8.3*  MG  --   --  1.5*  PHOS  --   --  3.6   Liver Function Tests: Recent Labs  Lab 10/03/20 0448  AST 12*  ALT 14  ALKPHOS 98   BILITOT 0.6  PROT 5.9*  ALBUMIN 2.5*   No results for input(s): LIPASE, AMYLASE in the last 168 hours. No results for input(s): AMMONIA in the last 168 hours. Coagulation Profile: Recent Labs  Lab 10/03/20 0448  INR 1.1   CBC: Recent Labs  Lab 10/02/20 2052 10/03/20 0448  WBC 12.3* 11.2*  NEUTROABS 8.6*  --   HGB 10.4* 9.3*  HCT 32.1* 28.8*  MCV 92.2 93.5  PLT 521* 505*   Cardiac Enzymes: No results for input(s): CKTOTAL, CKMB, CKMBINDEX, TROPONINI in the last 168 hours. BNP: Invalid input(s): POCBNP CBG: Recent Labs  Lab 10/03/20 0735 10/03/20 1158  GLUCAP 103* 113*   HbA1C: No results for input(s): HGBA1C in the last 72 hours. Urine analysis:    Component Value Date/Time   COLORURINE YELLOW 12/26/2017 2015   APPEARANCEUR CLEAR 12/26/2017 2015   LABSPEC 1.014 12/26/2017 2015   PHURINE 5.0 12/26/2017 2015   GLUCOSEU NEGATIVE 12/26/2017 2015   HGBUR NEGATIVE 12/26/2017 2015   [place] NEGATIVE 12/26/2017 2015  [place] NEGATIVE 12/26/2017 2015   PROTEINUR NEGATIVE 12/26/2017 2015   NITRITE NEGATIVE 12/26/2017 2015   LEUKOCYTESUR SMALL (A) 12/26/2017 2015   Sepsis Labs: @LABRCNTIP (procalcitonin:4,lacticidven:4) ) Recent Results (from the past 240 hour(s))  SARS CORONAVIRUS 2 ([place] 6-24 HRS) Nasopharyngeal Nasopharyngeal Swab     Status: None   Collection Time: 09/24/20 11:03 PM   Specimen: Nasopharyngeal Swab  Result Value Ref Range Status   SARS Coronavirus 2 NEGATIVE NEGATIVE Final    Comment: (NOTE) SARS-CoV-2 target nucleic acids are NOT DETECTED.  The SARS-CoV-2 RNA is generally detectable in upper and lower respiratory specimens during the acute phase of infection. Negative results do not preclude SARS-CoV-2 infection, do not rule out co-infections with other pathogens, and should not be used as the sole basis for treatment or other patient management decisions. Negative results must be combined with clinical observations, patient  history, and epidemiological information. The expected result is Negative.  Fact Sheet for Patients: SugarRoll.be  Fact Sheet for Healthcare Providers: https://www.woods-mathews.com/  This test is not yet approved or cleared by the [place] FDA and  has been authorized for detection and/or diagnosis of SARS-CoV-2 by FDA under an Emergency Use Authorization (EUA). This EUA will remain  in effect (meaning this test can be used) for the duration of the COVID-19 declaration under Se ction 564(b)(1) of the Act, 21 U.S.C. section 360bbb-3(b)(1), unless the authorization is terminated or revoked sooner.  Performed at [facility], [place] [address], [place], [place] [address]   Resp Panel by RT-PCR (Flu A&B, Covid) Nasopharyngeal Swab     Status: None   Collection Time: 10/02/20  8:57 PM   Specimen: Nasopharyngeal Swab; Nasopharyngeal(NP) swabs in vial transport medium  Result Value Ref Range Status   SARS Coronavirus 2 by RT PCR NEGATIVE NEGATIVE Final    Comment: (NOTE) SARS-CoV-2 target nucleic acids are NOT DETECTED.  The SARS-CoV-2 RNA is generally detectable in upper respiratory specimens during the acute phase of infection. The lowest concentration of SARS-CoV-2 viral copies this assay can detect is 138 copies/mL. A negative result does not preclude SARS-Cov-2 infection and should not be used as the sole basis for treatment or other patient management decisions. A negative result may occur with  improper specimen collection/handling, submission of specimen other than nasopharyngeal swab, presence of viral mutation(s) within the areas targeted by this assay, and inadequate number of viral copies(<138 copies/mL). A negative result must be combined with clinical observations, patient history, and epidemiological information. The expected result is Negative.  Fact Sheet for Patients:   EntrepreneurPulse.com.au  Fact Sheet for Healthcare Providers:  IncredibleEmployment.be  This test is no t yet approved or cleared by the [place] FDA and  has been authorized for detection and/or diagnosis of SARS-CoV-2 by FDA under an Emergency Use Authorization (EUA). This EUA will remain  in effect (meaning this test can be used) for the duration of the COVID-19 declaration under Section 564(b)(1) of the Act, 21 U.S.C.section 360bbb-3(b)(1), unless the authorization is terminated  or revoked sooner.       Influenza A by PCR NEGATIVE NEGATIVE Final   Influenza B by PCR NEGATIVE NEGATIVE Final    Comment: (NOTE) The Xpert Xpress SARS-CoV-2/FLU/RSV plus assay is intended as an aid in the diagnosis of influenza from Nasopharyngeal swab specimens and should not be used as a sole basis for treatment. Nasal washings and aspirates are unacceptable for Xpert Xpress SARS-CoV-2/FLU/RSV testing.  Fact Sheet for Patients: EntrepreneurPulse.com.au  Fact Sheet for Healthcare  Providers: IncredibleEmployment.be  This test is not yet approved or cleared by the [place] and has been authorized for detection and/or diagnosis of SARS-CoV-2 by FDA under an Emergency Use Authorization (EUA). This EUA will remain in effect (meaning this test can be used) for the duration of the COVID-19 declaration under Section 564(b)(1) of the Act, 21 U.S.C. section 360bbb-3(b)(1), unless the authorization is terminated or revoked.  Performed at [facility], [address], [place], [place] [address]      Scheduled Meds: . atorvastatin  40 mg Oral Daily  . heparin  5,000 Units Subcutaneous Q8H  . insulin aspart  0-5 Units Subcutaneous QHS  . insulin aspart  0-9 Units Subcutaneous TID WC  . pantoprazole  40 mg Oral Daily  . predniSONE  30 mg Oral Daily   Continuous Infusions:  Procedures/Studies: DG Chest 2  View  Result Date: 10/02/2020 CLINICAL DATA:  Weakness. EXAM: CHEST - 2 VIEW COMPARISON:  Radiograph 09/23/2020 FINDINGS: Lower lung volumes from prior exam. Upper normal heart size. No focal airspace disease, pleural effusion, or pneumothorax. Stable osseous structures. IMPRESSION: Low lung volumes without acute findings. Electronically Signed   By: Keith Rake M.D.   On: 10/02/2020 22:56   DG Chest Portable 1 View  Result Date: 09/23/2020 CLINICAL DATA:  Increased peripheral edema EXAM: PORTABLE CHEST 1 VIEW COMPARISON:  07/11/2020 FINDINGS: Heart is borderline in size. Lingular scarring or atelectasis at the left base. Right lung clear. No effusions or edema. No acute bony abnormality. IMPRESSION: Lingular scarring or atelectasis.  No active disease. Electronically Signed   By: Rolm Baptise M.D.   On: 09/23/2020 10:41   VAS [place] LOWER EXTREMITY VENOUS REFLUX  Result Date: 09/28/2020  Lower Venous Reflux Study Indications: Pain, and Swelling.  Limitations: Poor ultrasound/tissue interface and poor positioning. Comparison Study: 01/31/2011: No evidence of deep or superficial thrombosis                   bilaterally Performing Technologist: Ivan Croft  Examination Guidelines: A complete evaluation includes B-mode imaging, spectral Doppler, color Doppler, and power Doppler as needed of all accessible portions of each vessel. Bilateral testing is considered an integral part of a complete examination. Limited examinations for reoccurring indications may be performed as noted. The reflux portion of the exam is performed with the patient in reverse Trendelenburg. Significant venous reflux is defined as >500 ms in the superficial venous system, and >1 second in the deep venous system.  Venous Reflux Times +--------------+---------+------+-----------+------------+-------------+ RIGHT         Reflux NoRefluxReflux TimeDiameter cmsComments                              Yes                                        +--------------+---------+------+-----------+------------+-------------+ CFV           no                                                  +--------------+---------+------+-----------+------------+-------------+ FV mid        no                                                  +--------------+---------+------+-----------+------------+-------------+  Popliteal     no                                                  +--------------+---------+------+-----------+------------+-------------+ GSV at [facility]    no                            0.51                  +--------------+---------+------+-----------+------------+-------------+ GSV prox thighno                            0.27                  +--------------+---------+------+-----------+------------+-------------+ GSV mid thigh no                            0.15                  +--------------+---------+------+-----------+------------+-------------+ GSV dist thighno                            0.14    out of fascia +--------------+---------+------+-----------+------------+-------------+ GSV at knee   no                            0.17                  +--------------+---------+------+-----------+------------+-------------+ GSV prox calf           yes    >500 ms      0.18                  +--------------+---------+------+-----------+------------+-------------+ SSV Pop Fossa no                            0.19                  +--------------+---------+------+-----------+------------+-------------+ SSV prox calf no                            0.13                  +--------------+---------+------+-----------+------------+-------------+ SSV mid calf  no                            0.20                  +--------------+---------+------+-----------+------------+-------------+  +--------------+---------+------+-----------+------------+-------------+ LEFT          Reflux NoRefluxReflux  TimeDiameter cmsComments                              Yes                                       +--------------+---------+------+-----------+------------+-------------+ CFV           no                                                  +--------------+---------+------+-----------+------------+-------------+  FV mid        no                                                  +--------------+---------+------+-----------+------------+-------------+ Popliteal     no                                                  +--------------+---------+------+-----------+------------+-------------+ GSV at SFJ              yes    >500 ms      0.67                  +--------------+---------+------+-----------+------------+-------------+ GSV prox thighno                            0.29                  +--------------+---------+------+-----------+------------+-------------+ GSV mid thigh no                            0.28    out of fascia +--------------+---------+------+-----------+------------+-------------+ GSV dist thighno                            0.23                  +--------------+---------+------+-----------+------------+-------------+ GSV at knee             yes    >500 ms      0.16                  +--------------+---------+------+-----------+------------+-------------+ GSV prox calf           yes    >500 ms      0.16                  +--------------+---------+------+-----------+------------+-------------+ SSV Pop Fossa           yes    >500 ms      0.33                  +--------------+---------+------+-----------+------------+-------------+ SSV prox calf           yes    >500 ms      0.17                  +--------------+---------+------+-----------+------------+-------------+ SSV mid calf            yes    >500 ms      0.24                  +--------------+---------+------+-----------+------------+-------------+   Summary:  Bilateral: - No evidence of deep vein thrombosis seen in the lower extremities, bilaterally, from the common femoral through the popliteal veins. - No evidence of superficial venous thrombosis in the lower extremities, bilaterally.  Right: - Venous reflux is noted in the right greater saphenous vein in the calf.  Left: - Venous reflux is noted in the left sapheno-femoral junction. - Venous reflux is noted in the left greater saphenous vein at the knee and in the  calf. - Venous reflux is noted in the left short saphenous vein.  *See table(s) above for measurements and observations. Electronically signed by Servando Snare MD on 09/28/2020 at 2:15:06 PM.    Final     Orson Eva, DO  [facility]  If 7PM-7AM, please contact night-coverage [email or web address] Password [email or web address] 10/03/2020, 2:55 PM   LOS: 1 day

## 2020-10-03 NOTE — Progress Notes (Signed)
NT [name] reported that patient when wiping patient there was some bright red blood on tissue and a scant amount with stool in [facility], Notified Dr. Carles Collet.

## 2020-10-03 NOTE — Plan of Care (Signed)

## 2020-10-04 ENCOUNTER — Inpatient Hospital Stay (HOSPITAL_COMMUNITY): Payer: Medicare Other

## 2020-10-04 DIAGNOSIS — R627 Adult failure to thrive: Secondary | ICD-10-CM

## 2020-10-04 DIAGNOSIS — E099 Drug or chemical induced diabetes mellitus without complications: Secondary | ICD-10-CM

## 2020-10-04 DIAGNOSIS — T380X5S Adverse effect of glucocorticoids and synthetic analogues, sequela: Secondary | ICD-10-CM

## 2020-10-04 LAB — GLUCOSE, CAPILLARY
Glucose-Capillary: 86 mg/dL (ref 70–99)
Glucose-Capillary: 144 mg/dL — ABNORMAL HIGH (ref 70–99)
Glucose-Capillary: 147 mg/dL — ABNORMAL HIGH (ref 70–99)
Glucose-Capillary: 145 mg/dL — ABNORMAL HIGH (ref 70–99)

## 2020-10-04 LAB — CBC WITH DIFFERENTIAL/PLATELET
Abs Immature Granulocytes: 0.46 10*3/uL — ABNORMAL HIGH (ref 0.00–0.07)
Basophils Absolute: 0.1 10*3/uL (ref 0.0–0.1)
Basophils Relative: 1 %
Eosinophils Absolute: 0 10*3/uL (ref 0.0–0.5)
Eosinophils Relative: 0 %
HCT: 27.5 % — ABNORMAL LOW (ref 36.0–46.0)
Hemoglobin: 8.8 g/dL — ABNORMAL LOW (ref 12.0–15.0)
Immature Granulocytes: 4 %
Lymphocytes Relative: 11 %
Lymphs Abs: 1.1 10*3/uL (ref 0.7–4.0)
MCH: 29.9 pg (ref 26.0–34.0)
MCHC: 32 g/dL (ref 30.0–36.0)
MCV: 93.5 fL (ref 80.0–100.0)
Monocytes Absolute: 0.8 10*3/uL (ref 0.1–1.0)
Monocytes Relative: 8 %
Neutro Abs: 8 10*3/uL — ABNORMAL HIGH (ref 1.7–7.7)
Neutrophils Relative %: 76 %
Platelets: 460 10*3/uL — ABNORMAL HIGH (ref 150–400)
RBC: 2.94 MIL/uL — ABNORMAL LOW (ref 3.87–5.11)
RDW: 15.9 % — ABNORMAL HIGH (ref 11.5–15.5)
WBC: 10.4 10*3/uL (ref 4.0–10.5)
nRBC: 0.4 % — ABNORMAL HIGH (ref 0.0–0.2)

## 2020-10-04 LAB — COMPREHENSIVE METABOLIC PANEL
ALT: 12 U/L (ref 0–44)
AST: 10 U/L — ABNORMAL LOW (ref 15–41)
Albumin: 2.2 g/dL — ABNORMAL LOW (ref 3.5–5.0)
Alkaline Phosphatase: 91 U/L (ref 38–126)
Anion gap: 7 (ref 5–15)
BUN: 60 mg/dL — ABNORMAL HIGH (ref 8–23)
CO2: 24 mmol/L (ref 22–32)
Calcium: 8.2 mg/dL — ABNORMAL LOW (ref 8.9–10.3)
Chloride: 102 mmol/L (ref 98–111)
Creatinine, Ser: 2.09 mg/dL — ABNORMAL HIGH (ref 0.44–1.00)
GFR, Estimated: 25 mL/min — ABNORMAL LOW (ref >60)
Glucose, Bld: 92 mg/dL (ref 70–99)
Potassium: 4.7 mmol/L (ref 3.5–5.1)
Sodium: 133 mmol/L — ABNORMAL LOW (ref 135–145)
Total Bilirubin: 0.4 mg/dL (ref 0.3–1.2)
Total Protein: 5.5 g/dL — ABNORMAL LOW (ref 6.5–8.1)

## 2020-10-04 LAB — FERRITIN: Ferritin: 745 ng/mL — ABNORMAL HIGH (ref 11–307)

## 2020-10-04 LAB — IRON AND TIBC
Iron: 52 ug/dL (ref 28–170)
Saturation Ratios: 41 % — ABNORMAL HIGH (ref 10.4–31.8)
TIBC: 126 ug/dL — ABNORMAL LOW (ref 250–450)
UIBC: 74 ug/dL

## 2020-10-04 MED ORDER — HYDROCERIN EX CREA
TOPICAL_CREAM | Freq: Every day | CUTANEOUS | Status: DC
  Filled 2020-10-04: qty 113

## 2020-10-04 NOTE — Consult Note (Addendum)
WOC Nurse Consult Note: Reason for Consult: Bilateral LE wounds Wound type: venous insufficiency Pressure Injury POA:N/A Measurement:Bedside RN Mickel Baas to measure wounds today and document on the Nursing flow sheet. Wound bed: ruddy red, macerated.  See also photos in the EMR from  Drainage (amount, consistency, odor) serosanguinous in small amount Periwound: hemosiderin staining, edema Dressing procedure/placement/frequency: I have discussed this patient's topical care with Dr. Carles Collet via [place] and we will forego Unna's Boots while in house in favor of daily wound care.  Guidance provided for Nursing for wound care consisting of daily cleansing with soap and water, rinse and patting dry followed by moisturizing with Eucerin cream. The dressing wounds will be with an antimicrobial nonadherent (xeroform, [place] # [phone]), covered with an ABD and securing with a dry boot-Kerlix wrapped from just below toes to just below knees and topped with a 6-inch ACE bandage wrapped in a similar manner. Patient reported to providers that she does not tolerate elevation. For pressure injury prevention, we will attempt to use Prevalon Boots.  Post-acute applications of Unna's Boots vs topical daily care may resume with PCP or with HHRN.  [facility] nursing team will not follow, but will remain available to this patient, the nursing and medical teams.  Please re-consult if needed. Thanks, Maudie Flakes, MSN, RN, [place], Arther Abbott  Pager# [phone]

## 2020-10-04 NOTE — Progress Notes (Signed)
Dressing change performed. Patient stated una boots were put on Monday. She has two bleeding wounds; one on each leg. They were cleaned and gauze dressing reapplied.

## 2020-10-04 NOTE — Plan of Care (Signed)

## 2020-10-04 NOTE — TOC Initial Note (Signed)
Transition of Care [facility]) - Initial/Assessment Note    Patient Details  Name: [name] MRN: [id number] Date of Birth: 13-Nov-1950  Transition [facility] CM/SW Contact:    Natasha Bence, LCSW Phone Number: 10/04/2020, 2:53 PM  Clinical Narrative:                 Patient is a 70 year old female admitted for Acute kidney injury superimposed on CKD. CSW observed patient's high readmission score and conducted risk assessment. PCP will need to be scheduled during week day. PCP not available on Sunday 10/04/20. Patient's daughter reported that they would be agreeable to SNF and [facility] if recommended. Patient's daughter reported that patient has been living independently and only utilizes cane and walker. Patient's daughter also reported that until patient began not feeling well in December of 2021, patient was able to perform all ADL's independently. TOC to follow.   Expected Discharge Plan: Home/Self Care Barriers to Discharge: Continued Medical Work up   Patient Goals and CMS Choice Patient states their goals for this hospitalization and ongoing recovery are:: Return home CMS Medicare.gov Compare Post Acute Care list provided to:: Patient Choice offered to / list presented to : Patient  Expected Discharge Plan and Services Expected Discharge Plan: Home/Self Care   Discharge Planning Services: NA Post Acute Care Choice: NA Living arrangements for the past 2 months: Single Family Home                   DME Agency: NA       HH Arranged: NA HH Agency: NA        Prior Living Arrangements/Services Living arrangements for the past 2 months: Single Family Home Lives with:: Self Patient language and need for interpreter reviewed:: Yes Do you feel safe going back to the place where you live?: Yes      Need for Family Participation in Patient Care: Yes (Comment) Care giver support system in place?: Yes (comment)   Criminal Activity/Legal Involvement Pertinent to Current  Situation/Hospitalization: No - Comment as needed  Activities of Daily Living Home Assistive Devices/Equipment: Cane (specify quad or straight),Walker (specify type) ADL Screening (condition at time of admission) Patient's cognitive ability adequate to safely complete daily activities?: Yes Is the patient deaf or have difficulty hearing?: No Does the patient have difficulty seeing, even when wearing glasses/contacts?: No Does the patient have difficulty concentrating, remembering, or making decisions?: No Patient able to express need for assistance with ADLs?: Yes Does the patient have difficulty dressing or bathing?: Yes Independently performs ADLs?: Yes (appropriate for developmental age) Does the patient have difficulty walking or climbing stairs?: Yes Weakness of Legs: Both Weakness of Arms/Hands: None  Permission Sought/Granted Permission sought to share information with : Family Supports Permission granted to share information with : Yes, Verbal Permission Granted  Share Information with NAME: Graves,Carla  Permission granted to share info w AGENCY: SNF or HH  Permission granted to share info w Relationship: (Daughter  Permission granted to share info w Contact Information: [phone]  Emotional Assessment Appearance:: Appears stated age     Orientation: : Oriented to Self,Oriented to Situation,Oriented to Place,Oriented to  Time Alcohol / Substance Use: Not Applicable Psych Involvement: No (comment)  Admission diagnosis:  AKI (acute kidney injury) ([place]) [N17.9] Acute kidney injury superimposed on CKD ([place]) [N17.9, N18.9] Patient Active Problem List   Diagnosis Date Noted  . Dehydration 10/03/2020  . Leukocytosis 10/03/2020  . Thrombocytosis 10/03/2020  . Hyponatremia 10/03/2020  .  Hypochloremia 10/03/2020  . Failure to thrive in adult 10/03/2020  . Acute renal failure superimposed on stage 3b chronic kidney disease ([place]) 10/03/2020  . Uncontrolled type 2 diabetes  mellitus with hyperglycemia, without long-term current use of insulin ([place]) 10/03/2020  . Acute kidney injury superimposed on CKD ([place]) 10/02/2020  . Chronic kidney disease 10/01/2020  . 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 Chronic insomnia 01/08/2020  . Ocular myasthenia gravis ([place]) 09/18/2019  . Peripheral edema 01/23/2019  . Asthma 05/14/2015  . Heel spur 02/24/2015  . Depression 02/24/2015  . Post-menopausal bleeding 06/02/2014  . OA (osteoarthritis) of knee 04/09/2014  . Epidermal cyst 10/01/2013  . Class 3 obesity 02/25/2013  . Back pain 03/04/2012  . Gout 01/25/2012  . Edema 01/28/2011  . OBESITY HYPOVENTILATION SYNDROME 06/16/2008  . OBSTRUCTIVE SLEEP APNEA 05/08/2008  . Hyperlipidemia 08/28/2006  . Anxiety 08/28/2006  . Essential hypertension 08/28/2006  . GERD 08/28/2006  . Osteoarthritis 08/28/2006   PCP:  Alycia Rossetti, MD Pharmacy:   [facility], [address] [place] [place] [address] Phone: [phone] Fax: [phone]     Social Determinants of Health (SDOH) Interventions    Readmission Risk Interventions Readmission Risk Prevention Plan 10/04/2020  Transportation Screening Complete  Home Care Screening Complete  Medication Review (RN CM) Complete  Some recent data might be hidden

## 2020-10-04 NOTE — Progress Notes (Addendum)
PROGRESS NOTE  [name] OZH:[id number] DOB: 05-Sep-1950 DOA: 10/02/2020 PCP: Alycia Rossetti, MD    Brief History:  70 year old female with history of hypertension, hyperlipidemia, OSA on CPAP, ocular myasthenia, diabetes mellitus type 2, and peripheral edema presenting at the instruction of her primary care provider secondary to worsening serum creatinine.  Unfortunately, the patient is not a great historian.  Much of the history is obtained from review of the medical record.  Apparently, the patient has struggled with lower extremity edema at least since December 2021.  She has been on and off of furosemide 40 mg daily since that period of time.  She has also been tried on [employer] on and off with difficult tolerance secondary to pain.  The patient was seen in the emergency department on 09/23/2020 secondary to lower extremity edema.  She was seen by the hospitalist at that time who felt the patient was stable for discharge home on furosemide 40 mg daily.  She return to the emergency department once again on 09/24/2020 because of continued swelling in her legs and because of the fluid blisters on her legs had ruptured.  The patient once again was evaluated by hospitalist who felt the patient was stable for discharge home.  In the interim, the patient saw vascular surgery on 09/28/2020 in the office.  Ultrasound of her legs at that time was negative for DVT but showed right venous reflux in the right greater saphenous vein in the calf.  There was also venous reflux in the left saphenofemoral junction, left greater saphenous vein, and left short saphenous vein.  Unna boots were placed on the patient and the patient was instructed to return to the office in 1 week.  Nevertheless, it appears that the patient was told to increase her furosemide to twice daily for approximately the last 5 days.  She followed up with her PCP on 10/01/2020.  Routine blood work showed increasing serum creatinine,  and the patient was instructed to come to the emergency department for further evaluation. The patient herself denies any fevers, chills, chest pain, shortness breath, nausea, vomiting or diarrhea, dysuria, hematuria.  He complains of her legs having some pain.  She denies any new medications.  She states that her appetite has been poor.  She has had some intermittent suprapubic discomfort. In the emergency department, the patient was noted to have serum creatinine of 4.23 and sodium 130.  LFTs unremarkable.  WBC 12.3, hemoglobin 12.4, platelets 501,000.  The patient was admitted for further evaluation and treatment of her acute on chronic renal failure.   Assessment/Plan: Acute on chronic renal failure--CKD stage IIIb -Baseline creatinine 1.2-1.5 -Secondary to volume depletion/hemodynamic change -Renal ultrasound--Small right kidney with renal cortical thinning; no hydronephrosis -FeNa--0.16% -Holding furosemide -Continue Judicious IV fluid -Obtain urine protein creatinine ratio--0.06 -Obtain urinalysis--no pyruia/hematuria/proteinuria  Hyponatremia -Secondary to volume depletion and poor solute intake -Judicious IV fluids>>improving  Uncontrolled diabetes mellitus type 2 with hyperglycemia -09/16/2020 hemoglobin A1c 8.7 -Holding Metformin -NovoLog sliding scale -CBGs controlled  Chronic venous stasis/Leg edema -multifactorial including venous stasis, hypoalbuminemia, steroids -check urine protein/creatinine ratio--0.06 -09/28/20 venous duplex neg DVT, positive venous reflux bilateral -wound care consulted--> forego Unna's Boots while in house in favor of daily wound care.  Guidance provided for Nursing for wound care consisting of daily cleansing with soap and water, rinse and patting dry followed by moisturizing with Eucerin cream. The dressing wounds will be with an antimicrobial nonadherent (  xeroform, Lawson # [phone]), covered with an ABD and securing with a dry boot-Kerlix wrapped  from just below toes to just below knees and topped with a 6-inch ACE bandage wrapped in a similar manner.  -Resume UNNA boots upon discharge  Hypomagnesemia -Replete  Hypertension -Holding losartan secondary to AKI and soft blood pressure  Hyperlipidemia -Holding statin for now -Check CPK--20  Ocular myasthenia -Continue Mestinon 60 mg 3 times daily as of 6 months ago -Restart prednisone  Hypomagnesemia -Replete  Anxiety/depression -Continue BuSpar  Thrombocytosis -check iron studies  Morbid Obesity -BMI 42.14 -lifestyle modification   Status is: Inpatient  Remains inpatient appropriate because:IV treatments appropriate due to intensity of illness or inability to take PO   Dispo: The patient is from: Home  Anticipated d/c is to: Home  Patient currently is not medically stable to d/c.              Difficult to place patient No        Family Communication:  no Family at bedside  Consultants:  none  Code Status:  FULL   DVT Prophylaxis:  [place] Heparin    Procedures: As Listed in Progress Note Above  Antibiotics: None    Subjective: Patient complains of pain in legs.  Denies f/c, cp, sob, n/v/d, abd pain.  Objective: Vitals:   10/03/20 1100 10/03/20 2112 10/03/20 2235 10/04/20 0521  BP: (!) 135/53 (!) 123/59  (!) 162/58  Pulse: 90 86 72 93  Resp: 18 18 16 18   Temp: 98.2 F (36.8 C) 97.9 F (36.6 C)  98.9 F (37.2 C)  TempSrc: Oral Oral  Oral  SpO2:  99% 98% 100%  Weight:      Height:        Intake/Output Summary (Last 24 hours) at 10/04/2020 1450 Last data filed at 10/04/2020 1300 Gross per 24 hour  Intake 1533.33 ml  Output 600 ml  Net 933.33 ml   Weight change:  Exam:   General:  Pt is alert, follows commands appropriately, not in acute distress  HEENT: No icterus, No thrush, No neck mass, [place]/AT  Cardiovascular: RRR, S1/S2, no rubs, no gallops  Respiratory: CTA bilaterally,  no wheezing, no crackles, no rhonchi  Abdomen: Soft/+BS, non tender, non distended, no guarding  Extremities: No edema, No lymphangitis, No petechiae, No rashes, no synovitis   Data Reviewed: I have personally reviewed following labs and imaging studies Basic Metabolic Panel: Recent Labs  Lab 10/01/20 1204 10/02/20 2052 10/03/20 0448 10/04/20 0525  NA 130* 130* 132* 133*  K 4.8 4.3 4.3 4.7  CL 95* 94* 99 102  CO2 23 21* 23 24  GLUCOSE 107* 126* 95 92  BUN 69* 74* 71* 60*  CREATININE 4.23* 3.63* 3.00* 2.09*  CALCIUM 8.4* 8.3* 8.3* 8.2*  MG  --   --  1.5*  --   PHOS  --   --  3.6  --    Liver Function Tests: Recent Labs  Lab 10/03/20 0448 10/04/20 0525  AST 12* 10*  ALT 14 12  ALKPHOS 98 91  BILITOT 0.6 0.4  PROT 5.9* 5.5*  ALBUMIN 2.5* 2.2*   No results for input(s): LIPASE, AMYLASE in the last 168 hours. No results for input(s): AMMONIA in the last 168 hours. Coagulation Profile: Recent Labs  Lab 10/03/20 0448  INR 1.1   CBC: Recent Labs  Lab 10/02/20 2052 10/03/20 0448 10/04/20 0525  WBC 12.3* 11.2* 10.4  NEUTROABS 8.6*  --  8.0*  HGB 10.4* 9.3*  8.8*  HCT 32.1* 28.8* 27.5*  MCV 92.2 93.5 93.5  PLT 521* 505* 460*   Cardiac Enzymes: Recent Labs  Lab 10/03/20 1535  CKTOTAL 20*   BNP: Invalid input(s): POCBNP CBG: Recent Labs  Lab 10/03/20 1158 10/03/20 1548 10/03/20 2109 10/04/20 0731 10/04/20 1105  GLUCAP 113* 139* 152* 86 144*   HbA1C: No results for input(s): HGBA1C in the last 72 hours. Urine analysis:    Component Value Date/Time   COLORURINE YELLOW 10/03/2020 1646   APPEARANCEUR HAZY (A) 10/03/2020 1646   LABSPEC 1.010 10/03/2020 1646   PHURINE 5.0 10/03/2020 1646   GLUCOSEU NEGATIVE 10/03/2020 1646   HGBUR NEGATIVE 10/03/2020 1646   BILIRUBINUR NEGATIVE 10/03/2020 1646   KETONESUR NEGATIVE 10/03/2020 1646   PROTEINUR NEGATIVE 10/03/2020 1646   NITRITE NEGATIVE 10/03/2020 1646   LEUKOCYTESUR NEGATIVE 10/03/2020 1646    Sepsis Labs: @LABRCNTIP (procalcitonin:4,lacticidven:4) ) Recent Results (from the past 240 hour(s))  SARS CORONAVIRUS 2 (Chasmine Lender 6-24 HRS) Nasopharyngeal Nasopharyngeal Swab     Status: None   Collection Time: 09/24/20 11:03 PM   Specimen: Nasopharyngeal Swab  Result Value Ref Range Status   SARS Coronavirus 2 NEGATIVE NEGATIVE Final    Comment: (NOTE) SARS-CoV-2 target nucleic acids are NOT DETECTED.  The SARS-CoV-2 RNA is generally detectable in upper and lower respiratory specimens during the acute phase of infection. Negative results do not preclude SARS-CoV-2 infection, do not rule out co-infections with other pathogens, and should not be used as the sole basis for treatment or other patient management decisions. Negative results must be combined with clinical observations, patient history, and epidemiological information. The expected result is Negative.  Fact Sheet for Patients: SugarRoll.be  Fact Sheet for Healthcare Providers: https://www.woods-mathews.com/  This test is not yet approved or cleared by the [place] FDA and  has been authorized for detection and/or diagnosis of SARS-CoV-2 by FDA under an Emergency Use Authorization (EUA). This EUA will remain  in effect (meaning this test can be used) for the duration of the COVID-19 declaration under Se ction 564(b)(1) of the Act, 21 U.S.C. section 360bbb-3(b)(1), unless the authorization is terminated or revoked sooner.  Performed at [facility], [place] [address] [place], [place] [address]   Resp Panel by RT-PCR (Flu A&B, Covid) Nasopharyngeal Swab     Status: None   Collection Time: 10/02/20  8:57 PM   Specimen: Nasopharyngeal Swab; Nasopharyngeal(NP) swabs in vial transport medium  Result Value Ref Range Status   SARS Coronavirus 2 by RT PCR NEGATIVE NEGATIVE Final    Comment: (NOTE) SARS-CoV-2 target nucleic acids are NOT DETECTED.  The SARS-CoV-2 RNA is  generally detectable in upper respiratory specimens during the acute phase of infection. The lowest concentration of SARS-CoV-2 viral copies this assay can detect is 138 copies/mL. A negative result does not preclude SARS-Cov-2 infection and should not be used as the sole basis for treatment or other patient management decisions. A negative result may occur with  improper specimen collection/handling, submission of specimen other than nasopharyngeal swab, presence of viral mutation(s) within the areas targeted by this assay, and inadequate number of viral copies(<138 copies/mL). A negative result must be combined with clinical observations, patient history, and epidemiological information. The expected result is Negative.  Fact Sheet for Patients:  EntrepreneurPulse.com.au  Fact Sheet for Healthcare Providers:  IncredibleEmployment.be  This test is no t yet approved or cleared by the [place] FDA and  has been authorized for detection and/or diagnosis of SARS-CoV-2 by FDA under an  Emergency Use Authorization (EUA). This EUA will remain  in effect (meaning this test can be used) for the duration of the COVID-19 declaration under Section 564(b)(1) of the Act, 21 U.S.C.section 360bbb-3(b)(1), unless the authorization is terminated  or revoked sooner.       Influenza A by PCR NEGATIVE NEGATIVE Final   Influenza B by PCR NEGATIVE NEGATIVE Final    Comment: (NOTE) The Xpert Xpress SARS-CoV-2/FLU/RSV plus assay is intended as an aid in the diagnosis of influenza from Nasopharyngeal swab specimens and should not be used as a sole basis for treatment. Nasal washings and aspirates are unacceptable for Xpert Xpress SARS-CoV-2/FLU/RSV testing.  Fact Sheet for Patients: EntrepreneurPulse.com.au  Fact Sheet for Healthcare Providers: IncredibleEmployment.be  This test is not yet approved or cleared by the [place] FDA and has been authorized for detection and/or diagnosis of SARS-CoV-2 by FDA under an Emergency Use Authorization (EUA). This EUA will remain in effect (meaning this test can be used) for the duration of the COVID-19 declaration under Section 564(b)(1) of the Act, 21 U.S.C. section 360bbb-3(b)(1), unless the authorization is terminated or revoked.  Performed at [facility], [address], [place], [place] [address]      Scheduled Meds: . atorvastatin  40 mg Oral Daily  . heparin  5,000 Units Subcutaneous Q8H  . hydrocerin   Topical Daily  . insulin aspart  0-5 Units Subcutaneous QHS  . insulin aspart  0-9 Units Subcutaneous TID WC  . pantoprazole  40 mg Oral Daily  . predniSONE  30 mg Oral Daily  . pyridostigmine  60 mg Oral Q8H   Continuous Infusions: . sodium chloride 75 mL/hr at 10/04/20 0500    Procedures/Studies: DG Chest 2 View  Result Date: 10/02/2020 CLINICAL DATA:  Weakness. EXAM: CHEST - 2 VIEW COMPARISON:  Radiograph 09/23/2020 FINDINGS: Lower lung volumes from prior exam. Upper normal heart size. No focal airspace disease, pleural effusion, or pneumothorax. Stable osseous structures. IMPRESSION: Low lung volumes without acute findings. Electronically Signed   By: Keith Rake M.D.   On: 10/02/2020 22:56   US RENAL  Result Date: 10/04/2020 CLINICAL DATA:  Acute kidney injury EXAM: RENAL / URINARY TRACT ULTRASOUND COMPLETE COMPARISON:  None. FINDINGS: Right Kidney: Renal measurements: 10.6 x 4.9 x 5.1 cm = volume: 138.3 mL. Echogenicity and renal cortical thickness are within normal limits. No mass, perinephric fluid, or hydronephrosis visualized. No sonographically demonstrable calculus or ureterectasis. Left Kidney: Renal measurements: 8.1 x 3.5 x 3.6 cm = volume: 53.8 mL. Echogenicity within normal limits. There is renal cortical thinning. No mass, perinephric fluid, or hydronephrosis visualized. No sonographically demonstrable calculus or ureterectasis.  Bladder: Appears normal for degree of bladder distention. Other: Liver echogenicity increased diffusely. IMPRESSION: 1. Small right kidney with renal cortical thinning. Etiology for these changes uncertain. Renal artery stenosis is a potential etiology for these changes. In this regard, question whether patient is hypertensive. 2. No obstructing focus in either kidney. Renal echogenicity within normal limits bilaterally. 3. Increased liver echogenicity, a finding likely due to hepatic steatosis. Electronically Signed   By: Lowella Grip III M.D.   On: 10/04/2020 10:36   DG Chest Portable 1 View  Result Date: 09/23/2020 CLINICAL DATA:  Increased peripheral edema EXAM: PORTABLE CHEST 1 VIEW COMPARISON:  07/11/2020 FINDINGS: Heart is borderline in size. Lingular scarring or atelectasis at the left base. Right lung clear. No effusions or edema. No acute bony abnormality. IMPRESSION: Lingular scarring or atelectasis.  No active disease. Electronically Signed  By: Rolm Baptise M.D.   On: 09/23/2020 10:41   VAS [place] LOWER EXTREMITY VENOUS REFLUX  Result Date: 09/28/2020  Lower Venous Reflux Study Indications: Pain, and Swelling.  Limitations: Poor ultrasound/tissue interface and poor positioning. Comparison Study: 01/31/2011: No evidence of deep or superficial thrombosis                   bilaterally Performing Technologist: Ivan Croft  Examination Guidelines: A complete evaluation includes B-mode imaging, spectral Doppler, color Doppler, and power Doppler as needed of all accessible portions of each vessel. Bilateral testing is considered an integral part of a complete examination. Limited examinations for reoccurring indications may be performed as noted. The reflux portion of the exam is performed with the patient in reverse Trendelenburg. Significant venous reflux is defined as >500 ms in the superficial venous system, and >1 second in the deep venous system.  Venous Reflux Times  +--------------+---------+------+-----------+------------+-------------+ RIGHT         Reflux NoRefluxReflux TimeDiameter cmsComments                              Yes                                       +--------------+---------+------+-----------+------------+-------------+ CFV           no                                                  +--------------+---------+------+-----------+------------+-------------+ FV mid        no                                                  +--------------+---------+------+-----------+------------+-------------+ Popliteal     no                                                  +--------------+---------+------+-----------+------------+-------------+ GSV at [facility]    no                            0.51                  +--------------+---------+------+-----------+------------+-------------+ GSV prox thighno                            0.27                  +--------------+---------+------+-----------+------------+-------------+ GSV mid thigh no                            0.15                  +--------------+---------+------+-----------+------------+-------------+ GSV dist thighno  0.14    out of fascia +--------------+---------+------+-----------+------------+-------------+ GSV at knee   no                            0.17                  +--------------+---------+------+-----------+------------+-------------+ GSV prox calf           yes    >500 ms      0.18                  +--------------+---------+------+-----------+------------+-------------+ SSV Pop Fossa no                            0.19                  +--------------+---------+------+-----------+------------+-------------+ SSV prox calf no                            0.13                  +--------------+---------+------+-----------+------------+-------------+ SSV mid calf  no                             0.20                  +--------------+---------+------+-----------+------------+-------------+  +--------------+---------+------+-----------+------------+-------------+ LEFT          Reflux NoRefluxReflux TimeDiameter cmsComments                              Yes                                       +--------------+---------+------+-----------+------------+-------------+ CFV           no                                                  +--------------+---------+------+-----------+------------+-------------+ FV mid        no                                                  +--------------+---------+------+-----------+------------+-------------+ Popliteal     no                                                  +--------------+---------+------+-----------+------------+-------------+ GSV at SFJ              yes    >500 ms      0.67                  +--------------+---------+------+-----------+------------+-------------+ GSV prox thighno                            0.29                  +--------------+---------+------+-----------+------------+-------------+  GSV mid thigh no                            0.28    out of fascia +--------------+---------+------+-----------+------------+-------------+ GSV dist thighno                            0.23                  +--------------+---------+------+-----------+------------+-------------+ GSV at knee             yes    >500 ms      0.16                  +--------------+---------+------+-----------+------------+-------------+ GSV prox calf           yes    >500 ms      0.16                  +--------------+---------+------+-----------+------------+-------------+ SSV Pop Fossa           yes    >500 ms      0.33                  +--------------+---------+------+-----------+------------+-------------+ SSV prox calf           yes    >500 ms      0.17                   +--------------+---------+------+-----------+------------+-------------+ SSV mid calf            yes    >500 ms      0.24                  +--------------+---------+------+-----------+------------+-------------+   Summary: Bilateral: - No evidence of deep vein thrombosis seen in the lower extremities, bilaterally, from the common femoral through the popliteal veins. - No evidence of superficial venous thrombosis in the lower extremities, bilaterally.  Right: - Venous reflux is noted in the right greater saphenous vein in the calf.  Left: - Venous reflux is noted in the left sapheno-femoral junction. - Venous reflux is noted in the left greater saphenous vein at the knee and in the calf. - Venous reflux is noted in the left short saphenous vein.  *See table(s) above for measurements and observations. Electronically signed by Servando Snare MD on 09/28/2020 at 2:15:06 PM.    Final     Orson Eva, DO  [facility]  If 7PM-7AM, please contact night-coverage [email or web address] Password [email or web address] 10/04/2020, 2:50 PM   LOS: 2 days

## 2020-10-05 ENCOUNTER — Encounter (HOSPITAL_COMMUNITY): Payer: Medicare Other

## 2020-10-05 ENCOUNTER — Ambulatory Visit: Payer: Medicare Other

## 2020-10-05 LAB — BASIC METABOLIC PANEL
Anion gap: 8 (ref 5–15)
BUN: 51 mg/dL — ABNORMAL HIGH (ref 8–23)
CO2: 23 mmol/L (ref 22–32)
Calcium: 8.3 mg/dL — ABNORMAL LOW (ref 8.9–10.3)
Chloride: 105 mmol/L (ref 98–111)
Creatinine, Ser: 1.73 mg/dL — ABNORMAL HIGH (ref 0.44–1.00)
GFR, Estimated: 31 mL/min — ABNORMAL LOW (ref >60)
Glucose, Bld: 85 mg/dL (ref 70–99)
Potassium: 4.7 mmol/L (ref 3.5–5.1)
Sodium: 136 mmol/L (ref 135–145)

## 2020-10-05 LAB — GLUCOSE, CAPILLARY
Glucose-Capillary: 101 mg/dL — ABNORMAL HIGH (ref 70–99)
Glucose-Capillary: 173 mg/dL — ABNORMAL HIGH (ref 70–99)
Glucose-Capillary: 165 mg/dL — ABNORMAL HIGH (ref 70–99)
Glucose-Capillary: 135 mg/dL — ABNORMAL HIGH (ref 70–99)

## 2020-10-05 LAB — URINE CULTURE: Culture: >=100000 — AB

## 2020-10-05 LAB — MAGNESIUM: Magnesium: 1.7 mg/dL (ref 1.7–2.4)

## 2020-10-05 MED ORDER — MAGNESIUM SULFATE 2 GM/50ML IV SOLN
2.0000 g | Freq: Once | INTRAVENOUS | Status: AC
  Administered 2020-10-05: 2 g via INTRAVENOUS
  Filled 2020-10-05: qty 50

## 2020-10-05 MED ORDER — OXYCODONE HCL 5 MG PO TABS
5.0000 mg | ORAL_TABLET | Freq: Once | ORAL | Status: AC
Start: 2020-10-05 — End: 2020-10-05
  Administered 2020-10-05: 5 mg via ORAL
  Filled 2020-10-05: qty 1

## 2020-10-05 MED ORDER — SODIUM CHLORIDE 0.9 % IV SOLN: INTRAVENOUS | Status: DC

## 2020-10-05 NOTE — TOC Progression Note (Signed)
Transition of Care [facility]) - Progression Note    Patient Details  Name: [name] MRN: [id number] Date of Birth: Dec 01, 1950  [facility] CM/SW Contact  Natasha Bence, LCSW Phone Number: 10/05/2020, 11:23 AM  Clinical Narrative:    PCP appointment already scheduled for March. TOC to follow.   Expected Discharge Plan: Home/Self Care Barriers to Discharge: Continued Medical Work up  Expected Discharge Plan and Services Expected Discharge Plan: Home/Self Care   Discharge Planning Services: NA Post Acute Care Choice: NA Living arrangements for the past 2 months: Single Family Home                   DME Agency: NA       HH Arranged: NA HH Agency: NA         Social Determinants of Health (SDOH) Interventions    Readmission Risk Interventions Readmission Risk Prevention Plan 10/04/2020  Transportation Screening Complete  PCP or Specialist Appt within 5-7 Days Complete  Home Care Screening Complete  Medication Review (RN CM) Complete  Some recent data might be hidden

## 2020-10-05 NOTE — Evaluation (Signed)
Physical Therapy Evaluation Patient Details Name: [name] MRN: [id number] DOB: 1951/05/24 Today's Date: 10/05/2020   History of Present Illness  [name] is a 70 y.o. female with medical history significant for  hypertension, hyperlipidemia, morbid obesity, GERD, sleep apnea on CPAP at night, chronic peripheral edema-likely secondary to venous insufficiency, steroid-induced Type 2 diabetes mellitus who presents to the emergency department due to worsening kidney function. Patient was seen in the ED on 3/2 and 3/3 due to chronic leg swelling, for which she was discharged home after being seen by hospitalist at that time and referred to wound care.  Her home Lasix was recently increased to 40 twice daily for 5 days by her PCP without improvement in her leg swelling.  Patient had a follow-up with her PCP yesterday during which labs done resulted in an elevated creatinine and she was asked to go to the ED for further evaluation and management.  Of note, patient's oral intake (food and fluid) has decreased.  She denies fever, chills, chest pain, shortness of breath.  She endorsed decreased urinary output.    Clinical Impression  Patient functioning near baseline for functional mobility and gait, demonstrates good return for transferring from bedside, chair and ambulating in room/hallway without loss of balance, had difficulty completing sit to stand from commode in bathroom requiring use of grab bar and Min assist and had to have assistance to wipe self after having bowel movement.  Patient tolerated sitting up in chair after therapy - RN notified.  Patient will benefit from continued physical therapy in hospital and recommended venue below to increase strength, balance, endurance for safe ADLs and gait.      Follow Up Recommendations Home health PT;Supervision for mobility/OOB;Supervision - Intermittent    Equipment Recommendations  None recommended by PT    Recommendations for  Other Services       Precautions / Restrictions Precautions Precautions: Fall Restrictions Weight Bearing Restrictions: No      Mobility  Bed Mobility Overal bed mobility: Modified Independent             General bed mobility comments: HOB raised, sleeps in lounge chair at home    Transfers Overall transfer level: Needs assistance Equipment used: Rolling walker (2 wheeled) Transfers: Sit to/from [facility] Sit to Stand: Min guard;Min assist Stand pivot transfers: Supervision       General transfer comment: demonstrates good return for transferring sit to stand from bedside and chair, had diffiuclty transferring from commode in bathroom requiring use of grab bars due to BLE weakness  Ambulation/Gait Ambulation/Gait assistance: Supervision;Min guard Gait Distance (Feet): 80 Feet Assistive device: Rolling walker (2 wheeled) Gait Pattern/deviations: Decreased step length - left;Decreased stance time - right;Decreased stride length Gait velocity: decreased   General Gait Details: slightly labored cadence without loss of balance, limited mostly due to fatigue  Stairs            Wheelchair Mobility    Modified Rankin (Stroke Patients Only)       Balance Overall balance assessment: Needs assistance Sitting-balance support: Feet supported;No upper extremity supported Sitting balance-Leahy Scale: Good Sitting balance - Comments: seated at EOB   Standing balance support: During functional activity;Bilateral upper extremity supported Standing balance-Leahy Scale: Fair Standing balance comment: fair/good using RW                             Pertinent Vitals/Pain Pain Assessment: No/denies pain  Home Living Family/patient expects to be discharged to:: Private residence Living Arrangements: Alone Available Help at Discharge: Family;Available 24 hours/day Type of Home: House Home Access: Stairs to enter Entrance Stairs-Rails:  Right;Left;Can reach both Entrance Stairs-Number of Steps: 2 Home Layout: One level Home Equipment: Walker - 2 wheels;Cane - single point      Prior Function Level of Independence: Independent with assistive device(s)         Comments: household and short distanced Horticulturist, commercial        Extremity/Trunk Assessment   Upper Extremity Assessment Upper Extremity Assessment: Overall WFL for tasks assessed    Lower Extremity Assessment Lower Extremity Assessment: Generalized weakness    Cervical / Trunk Assessment Cervical / Trunk Assessment: Normal  Communication   Communication: No difficulties  Cognition Arousal/Alertness: Awake/alert Behavior During Therapy: WFL for tasks assessed/performed Overall Cognitive Status: Within Functional Limits for tasks assessed                                        General Comments      Exercises     Assessment/Plan    PT Assessment Patient needs continued PT services  PT Problem List Decreased strength;Decreased activity tolerance;Decreased balance;Decreased mobility       PT Treatment Interventions DME instruction;Gait training;Stair training;Functional mobility training;Therapeutic activities;Therapeutic exercise;Patient/family education;Balance training    PT Goals (Current goals can be found in the Care Plan section)  Acute Rehab PT Goals Patient Stated Goal: return home with family to assist PT Goal Formulation: With patient Time For Goal Achievement: 10/09/20 Potential to Achieve Goals: Good    Frequency Min 3X/week   Barriers to discharge        Co-evaluation               AM-PAC PT "6 Clicks" Mobility  Outcome Measure Help needed turning from your back to your side while in a flat bed without using bedrails?: None Help needed moving from lying on your back to sitting on the side of a flat bed without using bedrails?: A Little Help needed moving to  and from a bed to a chair (including a wheelchair)?: A Little Help needed standing up from a chair using your arms (e.g., wheelchair or bedside chair)?: A Little Help needed to walk in hospital room?: A Little Help needed climbing 3-5 steps with a railing? : A Lot 6 Click Score: 18    End of Session   Activity Tolerance: Patient tolerated treatment well;Patient limited by fatigue Patient left: in chair;with call bell/phone within reach Nurse Communication: Mobility status PT Visit Diagnosis: Other abnormalities of gait and mobility (R26.89);Muscle weakness (generalized) (M62.81);Unsteadiness on feet (R26.81)    Time: 8119-1478 PT Time Calculation (min) (ACUTE ONLY): 26 min   Charges:   PT Evaluation $PT Eval Moderate Complexity: 1 Mod PT Treatments $Therapeutic Activity: 23-37 mins        12:21 PM, 10/05/20 Lonell Grandchild, MPT Physical Therapist with [facility] [phone] office [phone] mobile phone

## 2020-10-05 NOTE — Progress Notes (Signed)
Dressings changed to bilateral lower extremities as ordered. Patient tolerated well. Heel elevated off the bed.

## 2020-10-05 NOTE — Plan of Care (Signed)
  Problem: Acute Rehab PT Goals(only PT should resolve) Goal: Pt Will Go Supine/Side To Sit Outcome: Progressing Flowsheets (Taken 10/05/2020 1224) Pt will go Supine/Side to Sit: with modified independence Goal: Patient Will Transfer Sit To/From Stand Outcome: Progressing Flowsheets (Taken 10/05/2020 1224) Patient will transfer sit to/from stand:  with modified independence  with supervision Goal: Pt Will Transfer Bed To Chair/Chair To Bed Outcome: Progressing Flowsheets (Taken 10/05/2020 1224) Pt will Transfer Bed to Chair/Chair to Bed:  with modified independence  with supervision Goal: Pt Will Ambulate Outcome: Progressing Flowsheets (Taken 10/05/2020 1224) Pt will Ambulate:  100 feet  with modified independence  with supervision  with rolling walker   12:25 PM, 10/05/20 Lonell Grandchild, MPT Physical Therapist with [facility] [phone] office [phone] mobile phone

## 2020-10-05 NOTE — Progress Notes (Signed)
PROGRESS NOTE  [name] NLZ:[id number] DOB: [birth year] DOA: 10/02/2020 PCP: Alycia Rossetti, MD    Brief History: 70 year old female with history of hypertension, hyperlipidemia, OSA on CPAP, ocular myasthenia, diabetes mellitus type 2, and peripheral edema presenting at the instruction of her primary care provider secondary to worsening serum creatinine. Unfortunately, the patient is not a great historian. Much of the history is obtained from review of the medical record. Apparently, the patient has struggled with lower extremity edema at least since December 2021.She has been on and off of furosemide 40 mg daily since that period of time. She has also been tried on [employer] on and off with difficult tolerance secondary to pain. The patient was seen in the emergency department on 09/23/2020 secondary to lower extremity edema. She was seen by the hospitalist at that time who felt the patient was stable for discharge homeon furosemide 40 mg daily.She return to the emergency department once again on 09/24/2020 because of continued swelling in her legs and because of the fluid blisters on her legs had ruptured. The patient once again was evaluated by hospitalist who felt the patient was stable for discharge home. In the interim, the patient sawvascular surgery on 09/28/2020 in the office. Ultrasound of her legs at that time was negative for DVT but showed right venous reflux in the right greater saphenous vein in the calf. There was also venous reflux in the left saphenofemoral junction, left greater saphenous vein, and left short saphenous vein.Unna boots were placed on the patient and the patient was instructed to return to the office in 1 week. Nevertheless, it appears that the patient was told to increase her furosemide to twice daily for approximately the last 5 days. She followed up with her PCP on 10/01/2020. Routine blood work showed increasing serum  creatinine,and the patient was instructed to come to the emergency department for further evaluation. The patient herself denies any fevers, chills, chest pain, shortness breath, nausea, vomiting or diarrhea, dysuria, hematuria. He complains of her legs having some pain. She denies any new medications. She states that her appetite has been poor. She has had some intermittent suprapubic discomfort. In the emergency department, the patient was noted to have serum creatinine of 4.23 and sodium 130. LFTs unremarkable. WBC 12.3, hemoglobin 12.4, platelets 501,000. The patient was admitted for further evaluation and treatment of her acute on chronic renal failure.   Assessment/Plan: Acute on chronic renal failure--CKD stage IIIb -Baseline creatinine 1.2-1.5 -Secondary to volume depletion/hemodynamic change -Renal ultrasound--Small right kidney with renal cortical thinning; no hydronephrosis -FeNa--0.16% -Holding furosemide -Continue Judicious IVfluid -Obtain urine protein creatinine ratio--0.06 -Obtain urinalysis--no pyruia/hematuria/proteinuria  Hyponatremia -Secondary to volume depletion and poor solute intake -Judicious IV fluids>>improving  Uncontrolled diabetes mellitus type 2 with hyperglycemia -09/16/2020 hemoglobin A1c 8.7 -Holding Metformin -NovoLog sliding scale -CBGs controlled  Chronic venous stasis/Leg edema -multifactorial including venous stasis, hypoalbuminemia, steroids -check urine protein/creatinine ratio--0.06 -09/28/20 venous duplex neg DVT, positive venous reflux bilateral -wound care consulted-->forego Unna's Boots while in house in favor of daily wound care. Guidance provided for Nursing for wound care consisting of daily cleansing with soap and water,rinse and patting dry followed by moisturizing with Eucerin cream. Thedressing wounds will bewith an antimicrobial nonadherent (xeroform, Lawson # [phone]), coveredwith anABD and securing with a dry  boot-Kerlix wrapped from just below toes to just below knees and topped with a 6-inch ACE bandage wrapped in a similar manner.  -Resume  UNNA boots upon discharge  Hypomagnesemia -Replete  Hypertension -Holding losartan secondary to AKI and soft blood pressure  Hyperlipidemia -restart statin -Check CPK--20  Ocular myasthenia -Continue Mestinon 60 mg 3 times daily as of 6 months ago -Restart prednisone  Anxiety/depression -Continue BuSpar  Thrombocytosis -iron sat 41% -ferritin 245  Morbid Obesity -BMI 42.14 -lifestyle modification   Status is: Inpatient  Remains inpatient appropriate because:IV treatments appropriate due to intensity of illness or inability to take PO   Dispo: The patient is from:Home Anticipated d/c is JK:DTOI Patient currently is not medically stable to d/c. Difficult to place patient No        Family Communication:noFamily at bedside  Consultants:none  Code Status: FULL   DVT Prophylaxis: [place] Heparin    Procedures: As Listed in Progress Note Above  Antibiotics: None    Subjective: Patient denies fevers, chills, headache, chest pain, dyspnea, nausea, vomiting, diarrhea, abdominal pain, dysuria, hematuria, hematochezia, and melena.   Objective: Vitals:   10/04/20 2106 10/04/20 2137 10/05/20 0536 10/05/20 1315  BP: (!) 116/102 (!) 135/54 (!) 136/59 133/67  Pulse: 88 87 82 72  Resp: 20  20 19   Temp: 98 F (36.7 C)  98.7 F (37.1 C) 98.3 F (36.8 C)  TempSrc: Oral  Oral Oral  SpO2: 97%  97% 98%  Weight:      Height:        Intake/Output Summary (Last 24 hours) at 10/05/2020 1512 Last data filed at 10/05/2020 0900 Gross per 24 hour  Intake 480 ml  Output --  Net 480 ml   Weight change:  Exam:   General:  Pt is alert, follows commands appropriately, not in acute distress  HEENT: No icterus, No thrush, No neck mass, [place]/AT  Cardiovascular:  RRR, S1/S2, no rubs, no gallops  Respiratory: CTA bilaterally, no wheezing, no crackles, no rhonchi  Abdomen: Soft/+BS, non tender, non distended, no guarding  Extremities: No edema, No lymphangitis, No petechiae, No rashes, no synovitis   Data Reviewed: I have personally reviewed following labs and imaging studies Basic Metabolic Panel: Recent Labs  Lab 10/01/20 1204 10/02/20 2052 10/03/20 0448 10/04/20 0525 10/05/20 0554  NA 130* 130* 132* 133* 136  K 4.8 4.3 4.3 4.7 4.7  CL 95* 94* 99 102 105  CO2 23 21* 23 24 23   GLUCOSE 107* 126* 95 92 85  BUN 69* 74* 71* 60* 51*  CREATININE 4.23* 3.63* 3.00* 2.09* 1.73*  CALCIUM 8.4* 8.3* 8.3* 8.2* 8.3*  MG  --   --  1.5*  --  1.7  PHOS  --   --  3.6  --   --    Liver Function Tests: Recent Labs  Lab 10/03/20 0448 10/04/20 0525  AST 12* 10*  ALT 14 12  ALKPHOS 98 91  BILITOT 0.6 0.4  PROT 5.9* 5.5*  ALBUMIN 2.5* 2.2*   No results for input(s): LIPASE, AMYLASE in the last 168 hours. No results for input(s): AMMONIA in the last 168 hours. Coagulation Profile: Recent Labs  Lab 10/03/20 0448  INR 1.1   CBC: Recent Labs  Lab 10/02/20 2052 10/03/20 0448 10/04/20 0525  WBC 12.3* 11.2* 10.4  NEUTROABS 8.6*  --  8.0*  HGB 10.4* 9.3* 8.8*  HCT 32.1* 28.8* 27.5*  MCV 92.2 93.5 93.5  PLT 521* 505* 460*   Cardiac Enzymes: Recent Labs  Lab 10/03/20 1535  CKTOTAL 20*   BNP: Invalid input(s): POCBNP CBG: Recent Labs  Lab 10/04/20 1105 10/04/20 1704 10/04/20 2104 10/05/20  0757 10/05/20 1116  GLUCAP 144* 147* 145* 101* 173*   HbA1C: No results for input(s): HGBA1C in the last 72 hours. Urine analysis:    Component Value Date/Time   COLORURINE YELLOW 10/03/2020 1646   APPEARANCEUR HAZY (A) 10/03/2020 1646   LABSPEC 1.010 10/03/2020 1646   PHURINE 5.0 10/03/2020 1646   GLUCOSEU NEGATIVE 10/03/2020 1646   HGBUR NEGATIVE 10/03/2020 1646   BILIRUBINUR NEGATIVE 10/03/2020 1646   KETONESUR NEGATIVE 10/03/2020  1646   PROTEINUR NEGATIVE 10/03/2020 1646   NITRITE NEGATIVE 10/03/2020 1646   LEUKOCYTESUR NEGATIVE 10/03/2020 1646   Sepsis Labs: @LABRCNTIP (procalcitonin:4,lacticidven:4) ) Recent Results (from the past 240 hour(s))  Resp Panel by RT-PCR (Flu A&B, Covid) Nasopharyngeal Swab     Status: None   Collection Time: 10/02/20  8:57 PM   Specimen: Nasopharyngeal Swab; Nasopharyngeal(NP) swabs in vial transport medium  Result Value Ref Range Status   SARS Coronavirus 2 by RT PCR NEGATIVE NEGATIVE Final    Comment: (NOTE) SARS-CoV-2 target nucleic acids are NOT DETECTED.  The SARS-CoV-2 RNA is generally detectable in upper respiratory specimens during the acute phase of infection. The lowest concentration of SARS-CoV-2 viral copies this assay can detect is 138 copies/mL. A negative result does not preclude SARS-Cov-2 infection and should not be used as the sole basis for treatment or other patient management decisions. A negative result may occur with  improper specimen collection/handling, submission of specimen other than nasopharyngeal swab, presence of viral mutation(s) within the areas targeted by this assay, and inadequate number of viral copies(<138 copies/mL). A negative result must be combined with clinical observations, patient history, and epidemiological information. The expected result is Negative.  Fact Sheet for Patients:  EntrepreneurPulse.com.au  Fact Sheet for Healthcare Providers:  IncredibleEmployment.be  This test is no t yet approved or cleared by the [place] FDA and  has been authorized for detection and/or diagnosis of SARS-CoV-2 by FDA under an Emergency Use Authorization (EUA). This EUA will remain  in effect (meaning this test can be used) for the duration of the COVID-19 declaration under Section 564(b)(1) of the Act, 21 U.S.C.section 360bbb-3(b)(1), unless the authorization is terminated  or revoked sooner.        Influenza A by PCR NEGATIVE NEGATIVE Final   Influenza B by PCR NEGATIVE NEGATIVE Final    Comment: (NOTE) The Xpert Xpress SARS-CoV-2/FLU/RSV plus assay is intended as an aid in the diagnosis of influenza from Nasopharyngeal swab specimens and should not be used as a sole basis for treatment. Nasal washings and aspirates are unacceptable for Xpert Xpress SARS-CoV-2/FLU/RSV testing.  Fact Sheet for Patients: EntrepreneurPulse.com.au  Fact Sheet for Healthcare Providers: IncredibleEmployment.be  This test is not yet approved or cleared by the [place] FDA and has been authorized for detection and/or diagnosis of SARS-CoV-2 by FDA under an Emergency Use Authorization (EUA). This EUA will remain in effect (meaning this test can be used) for the duration of the COVID-19 declaration under Section 564(b)(1) of the Act, 21 U.S.C. section 360bbb-3(b)(1), unless the authorization is terminated or revoked.  Performed at [facility], [address] [place] [place], [place] [address]   Culture, Urine     Status: Abnormal   Collection Time: 10/03/20  4:46 PM   Specimen: Urine, Clean Catch  Result Value Ref Range Status   Specimen Description   Final    URINE, CLEAN CATCH Performed at [facility], [address], [place], [place] [address]    Special Requests   Final  NONE Performed at [facility], [address] [place], [place] [address]    Culture >=100,000 COLONIES/mL LACTOBACILLUS SPECIES (A)  Final   Report Status 10/05/2020 FINAL  Final     Scheduled Meds: . atorvastatin  40 mg Oral Daily  . heparin  5,000 Units Subcutaneous Q8H  . hydrocerin   Topical Daily  . insulin aspart  0-5 Units Subcutaneous QHS  . insulin aspart  0-9 Units Subcutaneous TID WC  . pantoprazole  40 mg Oral Daily  . predniSONE  30 mg Oral Daily  . pyridostigmine  60 mg Oral Q8H   Continuous Infusions: . sodium chloride      Procedures/Studies: DG Chest 2  View  Result Date: 10/02/2020 CLINICAL DATA:  Weakness. EXAM: CHEST - 2 VIEW COMPARISON:  Radiograph 09/23/2020 FINDINGS: Lower lung volumes from prior exam. Upper normal heart size. No focal airspace disease, pleural effusion, or pneumothorax. Stable osseous structures. IMPRESSION: Low lung volumes without acute findings. Electronically Signed   By: Keith Rake M.D.   On: 10/02/2020 22:56   US RENAL  Result Date: 10/04/2020 CLINICAL DATA:  Acute kidney injury EXAM: RENAL / URINARY TRACT ULTRASOUND COMPLETE COMPARISON:  None. FINDINGS: Right Kidney: Renal measurements: 10.6 x 4.9 x 5.1 cm = volume: 138.3 mL. Echogenicity and renal cortical thickness are within normal limits. No mass, perinephric fluid, or hydronephrosis visualized. No sonographically demonstrable calculus or ureterectasis. Left Kidney: Renal measurements: 8.1 x 3.5 x 3.6 cm = volume: 53.8 mL. Echogenicity within normal limits. There is renal cortical thinning. No mass, perinephric fluid, or hydronephrosis visualized. No sonographically demonstrable calculus or ureterectasis. Bladder: Appears normal for degree of bladder distention. Other: Liver echogenicity increased diffusely. IMPRESSION: 1. Small right kidney with renal cortical thinning. Etiology for these changes uncertain. Renal artery stenosis is a potential etiology for these changes. In this regard, question whether patient is hypertensive. 2. No obstructing focus in either kidney. Renal echogenicity within normal limits bilaterally. 3. Increased liver echogenicity, a finding likely due to hepatic steatosis. Electronically Signed   By: Lowella Grip III M.D.   On: 10/04/2020 10:36   DG Chest Portable 1 View  Result Date: 09/23/2020 CLINICAL DATA:  Increased peripheral edema EXAM: PORTABLE CHEST 1 VIEW COMPARISON:  07/11/2020 FINDINGS: Heart is borderline in size. Lingular scarring or atelectasis at the left base. Right lung clear. No effusions or edema. No acute bony  abnormality. IMPRESSION: Lingular scarring or atelectasis.  No active disease. Electronically Signed   By: Rolm Baptise M.D.   On: 09/23/2020 10:41   VAS [place] LOWER EXTREMITY VENOUS REFLUX  Result Date: 09/28/2020  Lower Venous Reflux Study Indications: Pain, and Swelling.  Limitations: Poor ultrasound/tissue interface and poor positioning. Comparison Study: 01/31/2011: No evidence of deep or superficial thrombosis                   bilaterally Performing Technologist: Ivan Croft  Examination Guidelines: A complete evaluation includes B-mode imaging, spectral Doppler, color Doppler, and power Doppler as needed of all accessible portions of each vessel. Bilateral testing is considered an integral part of a complete examination. Limited examinations for reoccurring indications may be performed as noted. The reflux portion of the exam is performed with the patient in reverse Trendelenburg. Significant venous reflux is defined as >500 ms in the superficial venous system, and >1 second in the deep venous system.  Venous Reflux Times +--------------+---------+------+-----------+------------+-------------+ RIGHT         Reflux NoRefluxReflux TimeDiameter cmsComments  Yes                                       +--------------+---------+------+-----------+------------+-------------+ CFV           no                                                  +--------------+---------+------+-----------+------------+-------------+ FV mid        no                                                  +--------------+---------+------+-----------+------------+-------------+ Popliteal     no                                                  +--------------+---------+------+-----------+------------+-------------+ GSV at [facility]    no                            0.51                  +--------------+---------+------+-----------+------------+-------------+ GSV prox thighno                             0.27                  +--------------+---------+------+-----------+------------+-------------+ GSV mid thigh no                            0.15                  +--------------+---------+------+-----------+------------+-------------+ GSV dist thighno                            0.14    out of fascia +--------------+---------+------+-----------+------------+-------------+ GSV at knee   no                            0.17                  +--------------+---------+------+-----------+------------+-------------+ GSV prox calf           yes    >500 ms      0.18                  +--------------+---------+------+-----------+------------+-------------+ SSV Pop Fossa no                            0.19                  +--------------+---------+------+-----------+------------+-------------+ SSV prox calf no                            0.13                  +--------------+---------+------+-----------+------------+-------------+  SSV mid calf  no                            0.20                  +--------------+---------+------+-----------+------------+-------------+  +--------------+---------+------+-----------+------------+-------------+ LEFT          Reflux NoRefluxReflux TimeDiameter cmsComments                              Yes                                       +--------------+---------+------+-----------+------------+-------------+ CFV           no                                                  +--------------+---------+------+-----------+------------+-------------+ FV mid        no                                                  +--------------+---------+------+-----------+------------+-------------+ Popliteal     no                                                  +--------------+---------+------+-----------+------------+-------------+ GSV at SFJ              yes    >500 ms      0.67                   +--------------+---------+------+-----------+------------+-------------+ GSV prox thighno                            0.29                  +--------------+---------+------+-----------+------------+-------------+ GSV mid thigh no                            0.28    out of fascia +--------------+---------+------+-----------+------------+-------------+ GSV dist thighno                            0.23                  +--------------+---------+------+-----------+------------+-------------+ GSV at knee             yes    >500 ms      0.16                  +--------------+---------+------+-----------+------------+-------------+ GSV prox calf           yes    >500 ms      0.16                  +--------------+---------+------+-----------+------------+-------------+ SSV Pop Fossa           yes    >  500 ms      0.33                  +--------------+---------+------+-----------+------------+-------------+ SSV prox calf           yes    >500 ms      0.17                  +--------------+---------+------+-----------+------------+-------------+ SSV mid calf            yes    >500 ms      0.24                  +--------------+---------+------+-----------+------------+-------------+   Summary: Bilateral: - No evidence of deep vein thrombosis seen in the lower extremities, bilaterally, from the common femoral through the popliteal veins. - No evidence of superficial venous thrombosis in the lower extremities, bilaterally.  Right: - Venous reflux is noted in the right greater saphenous vein in the calf.  Left: - Venous reflux is noted in the left sapheno-femoral junction. - Venous reflux is noted in the left greater saphenous vein at the knee and in the calf. - Venous reflux is noted in the left short saphenous vein.  *See table(s) above for measurements and observations. Electronically signed by Servando Snare MD on 09/28/2020 at 2:15:06 PM.    Final     Orson Eva, DO  [facility]  If 7PM-7AM, please contact night-coverage [email or web address] Password [email or web address] 10/05/2020, 3:12 PM   LOS: 3 days

## 2020-10-05 NOTE — Discharge Summary (Signed)
Physician Discharge Summary  [name] W[facility]:[id number] DOB: [birth year] DOA: 10/02/2020  PCP: Alycia Rossetti, MD  Admit date: 10/02/2020 Discharge date: 10/06/2020  Admitted From: Home Disposition:  Home  Recommendations for Outpatient Follow-up:  1. Follow up with PCP in 1-2 weeks 2. Please obtain BMP/CBC in one week 3. Please continue to renew referral to wound care center for once a week UNNA boots   Home Health: YES Equipment/Devices: HHPT  Discharge Condition: Stable CODE STATUS:FULL Diet recommendation: Heart Healthy    Brief/Interim Summary: 70 year old female with history of hypertension, hyperlipidemia, OSA on CPAP, ocular myasthenia, diabetes mellitus type 2, and peripheral edema presenting at the instruction of her primary care provider secondary to worsening serum creatinine. Unfortunately, the patient is not a great historian. Much of the history is obtained from review of the medical record. Apparently, the patient has struggled with lower extremity edema at least since December 2021.She has been on and off of furosemide 40 mg daily since that period of time. She has also been tried on [employer] on and off with difficult tolerance secondary to pain. The patient was seen in the emergency department on 09/23/2020 secondary to lower extremity edema. She was seen by the hospitalist at that time who felt the patient was stable for discharge homeon furosemide 40 mg daily.She return to the emergency department once again on 09/24/2020 because of continued swelling in her legs and because of the fluid blisters on her legs had ruptured. The patient once again was evaluated by hospitalist who felt the patient was stable for discharge home. In the interim, the patient sawvascular surgery on 09/28/2020 in the office. Ultrasound of her legs at that time was negative for DVT but showed right venous reflux in the right greater saphenous vein in the calf. There was also  venous reflux in the left saphenofemoral junction, left greater saphenous vein, and left short saphenous vein.Unna boots were placed on the patient and the patient was instructed to return to the office in 1 week. Nevertheless, it appears that the patient was told to increase her furosemide to twice daily for approximately the last 5 days. She followed up with her PCP on 10/01/2020. Routine blood work showed increasing serum creatinine,and the patient was instructed to come to the emergency department for further evaluation. The patient herself denies any fevers, chills, chest pain, shortness breath, nausea, vomiting or diarrhea, dysuria, hematuria. He complains of her legs having some pain. She denies any new medications. She states that her appetite has been poor. She has had some intermittent suprapubic discomfort. In the emergency department, the patient was noted to have serum creatinine of 4.23 and sodium 130. LFTs unremarkable. WBC 12.3, hemoglobin 12.4, platelets 501,000. The patient was admitted for further evaluation and treatment of her acute on chronic renal failure.  She was started on IVF and her lasix was held.  She gradually improved clinically.  Discharge Diagnoses:  Acute on chronic renal failure--CKD stage IIIb -Baseline creatinine 1.2-1.5 -Secondary to volume depletion/hemodynamic change -Renal ultrasound--Small right kidney with renal cortical thinning; no hydronephrosis -FeNa--0.16% -Holding furosemide during the hospitalization -ContinueJudicious IVfluid -Obtain urine protein creatinine ratio--0.06 -Obtain urinalysis--no pyruia/hematuria/proteinuria -serum creatinine 1.44 on day of discharge  Hyponatremia -Secondary to volume depletion and poor solute intake -Judicious IV fluids>>improving -Na 139 on day of discharge  Uncontrolled diabetes mellitus type 2 with hyperglycemia -09/16/2020 hemoglobin A1c 8.7 -Holding Metformin>>restart after  discharge -NovoLog sliding scale -CBGs controlled  Chronic venous stasis/Leg edema -multifactorial including venous stasis,  hypoalbuminemia, steroids -seen by wound care team during the hospitalization -check urine protein/creatinine ratio--0.06 -09/28/20 venous duplex neg DVT, positive venous reflux bilateral -wound care consulted-->forego Unna's Boots while in house in favor of daily wound care. Guidance provided for Nursing for wound care consisting of daily cleansing with soap and water,rinse and patting dry followed by moisturizing with Eucerin cream. Thedressing wounds will bewith an antimicrobial nonadherent (xeroform, Lawson # [id number]), coveredwith anABD and securing with a dry boot-Kerlix wrapped from just below toes to just below knees and topped with a 6-inch ACE bandage wrapped in a similar manner. -Resume UNNA boots upon discharge--dressing reapplied on day of discharge -she will need to go to wound care center for UNNA boots  Hypomagnesemia -Repleted  Hypertension -Holding losartan secondary to AKI and soft blood pressure -will not restart losartan due to CKD  Hyperlipidemia -restart statin -Check CPK--20  Ocular myasthenia -Continue Mestinon 60 mg 3 times daily as of 6 months ago -Restart prednisone  Anxiety/depression -Continue BuSpar  Thrombocytosis -iron sat 41% -ferritin 245  Morbid Obesity -BMI 42.14 -lifestyle modification     Discharge Instructions   Allergies as of 10/06/2020   No Known Allergies     Medication List    STOP taking these medications   losartan 50 MG tablet Commonly known as: COZAAR     TAKE these medications   acetaminophen 500 MG tablet Commonly known as: TYLENOL Take 1,000 mg by mouth every 6 (six) hours as needed for mild pain.   albuterol 108 (90 Base) MCG/ACT inhaler Commonly known as: VENTOLIN HFA INHALE 2 PUFFS INTO THE LUNGS EVERY 4 HOURS AS NEEDED FOR WHEEZING OR SHORTNESS OF BREATH    allopurinol 100 MG tablet Commonly known as: ZYLOPRIM TAKE 1 TABLET(100 MG) BY MOUTH DAILY What changed: See the new instructions.   aspirin EC 81 MG tablet Take 1 tablet (81 mg total) by mouth every evening.   atorvastatin 40 MG tablet Commonly known as: LIPITOR TAKE 1 TABLET(40 MG) BY MOUTH DAILY AT 6 PM What changed: See the new instructions.   Blood Glucose System Pak Kit Use as directed to monitor FSBS 3x weekly. Dx: R73.09.   BLOOD GLUCOSE TEST STRIPS Strp Use as directed to monitor FSBS 3x weekly. Dx: R73.09.   busPIRone 5 MG tablet Commonly known as: BUSPAR Take 1 tablet (5 mg total) by mouth 2 (two) times daily. For anxiety   clotrimazole 1 % cream Commonly known as: LOTRIMIN Apply 1 application topically 2 (two) times daily.   diphenhydrAMINE 25 MG tablet Commonly known as: BENADRYL Take 25 mg by mouth daily.   fluticasone 0.05 % cream Commonly known as: CUTIVATE APPLY EXTERNALLY TO THE AFFECTED AREA DAILY   furosemide 40 MG tablet Commonly known as: LASIX TAKE 1 TABLET(40 MG) BY MOUTH DAILY What changed: See the new instructions.   HYDROcodone-acetaminophen 7.5-325 MG tablet Commonly known as: Norco Take 1 tablet by mouth 2 (two) times daily as needed for moderate pain.   Lancets Misc Use as directed to monitor FSBS 3x weekly. Dx: R73.09.   Magnesium 250 MG Tabs Take 1 tablet (250 mg total) by mouth daily. What changed:   when to take this  additional instructions   metFORMIN 500 MG tablet Commonly known as: GLUCOPHAGE Take 1 tablet (500 mg total) by mouth 2 (two) times daily with a meal.   nystatin powder Commonly known as: MYCOSTATIN/NYSTOP Apply 1 application topically 3 (three) times daily.   omeprazole 40 MG capsule Commonly known as: PRILOSEC  TAKE 1 CAPSULE(40 MG) BY MOUTH DAILY What changed: See the new instructions.   OXYGEN Inhale 1 L into the lungs at bedtime. IN ADDITION TO CPAP NIGHTLY   potassium chloride 10 MEQ  tablet Commonly known as: KLOR-CON TAKE 1 TABLET BY MOUTH DAILY WITH FLUID PILL What changed: See the new instructions.   predniSONE 10 MG tablet Commonly known as: DELTASONE Pt currently on 36m daily, start taking 567mdaily for one month, then decrease to 4023maily for 1 month, then decrease 74m26mily. What changed:   how much to take  how to take this  when to take this  additional instructions   pyridostigmine 180 MG CR tablet Commonly known as: MESTINON Take 180 mg by mouth daily. What changed: Another medication with the same name was removed. Continue taking this medication, and follow the directions you see here.   traZODone 100 MG tablet Commonly known as: DESYREL TAKE 1 TABLET BY MOUTH AT BEDTIME AS NEEDED FOR SLEEP What changed:   reasons to take this  additional instructions       No Known Allergies  Consultations:  none   Procedures/Studies: DG Chest 2 View  Result Date: 10/02/2020 CLINICAL DATA:  Weakness. EXAM: CHEST - 2 VIEW COMPARISON:  Radiograph 09/23/2020 FINDINGS: Lower lung volumes from prior exam. Upper normal heart size. No focal airspace disease, pleural effusion, or pneumothorax. Stable osseous structures. IMPRESSION: Low lung volumes without acute findings. Electronically Signed   By: MelaKeith Rake.   On: 10/02/2020 22:56   US R[place]AL  Result Date: 10/04/2020 CLINICAL DATA:  Acute kidney injury EXAM: RENAL / URINARY TRACT ULTRASOUND COMPLETE COMPARISON:  None. FINDINGS: Right Kidney: Renal measurements: 10.6 x 4.9 x 5.1 cm = volume: 138.3 mL. Echogenicity and renal cortical thickness are within normal limits. No mass, perinephric fluid, or hydronephrosis visualized. No sonographically demonstrable calculus or ureterectasis. Left Kidney: Renal measurements: 8.1 x 3.5 x 3.6 cm = volume: 53.8 mL. Echogenicity within normal limits. There is renal cortical thinning. No mass, perinephric fluid, or hydronephrosis visualized. No  sonographically demonstrable calculus or ureterectasis. Bladder: Appears normal for degree of bladder distention. Other: Liver echogenicity increased diffusely. IMPRESSION: 1. Small right kidney with renal cortical thinning. Etiology for these changes uncertain. Renal artery stenosis is a potential etiology for these changes. In this regard, question whether patient is hypertensive. 2. No obstructing focus in either kidney. Renal echogenicity within normal limits bilaterally. 3. Increased liver echogenicity, a finding likely due to hepatic steatosis. Electronically Signed   By: WillLowella Grip M.D.   On: 10/04/2020 10:36   DG Chest Portable 1 View  Result Date: 09/23/2020 CLINICAL DATA:  Increased peripheral edema EXAM: PORTABLE CHEST 1 VIEW COMPARISON:  07/11/2020 FINDINGS: Heart is borderline in size. Lingular scarring or atelectasis at the left base. Right lung clear. No effusions or edema. No acute bony abnormality. IMPRESSION: Lingular scarring or atelectasis.  No active disease. Electronically Signed   By: KeviRolm Baptise.   On: 09/23/2020 10:41   VAS US L[place]ER EXTREMITY VENOUS REFLUX  Result Date: 09/28/2020  Lower Venous Reflux Study Indications: Pain, and Swelling.  Limitations: Poor ultrasound/tissue interface and poor positioning. Comparison Study: 01/31/2011: No evidence of deep or superficial thrombosis                   bilaterally Performing Technologist: MegaIvan Croftamination Guidelines: A complete evaluation includes B-mode imaging, spectral Doppler, color Doppler, and power Doppler as needed of all accessible portions  of each vessel. Bilateral testing is considered an integral part of a complete examination. Limited examinations for reoccurring indications may be performed as noted. The reflux portion of the exam is performed with the patient in reverse Trendelenburg. Significant venous reflux is defined as >500 ms in the superficial venous system, and >1 second in the deep venous  system.  Venous Reflux Times +--------------+---------+------+-----------+------------+-------------+ RIGHT         Reflux NoRefluxReflux TimeDiameter cmsComments                              Yes                                       +--------------+---------+------+-----------+------------+-------------+ CFV           no                                                  +--------------+---------+------+-----------+------------+-------------+ FV mid        no                                                  +--------------+---------+------+-----------+------------+-------------+ Popliteal     no                                                  +--------------+---------+------+-----------+------------+-------------+ GSV at [facility]    no                            0.51                  +--------------+---------+------+-----------+------------+-------------+ GSV prox thighno                            0.27                  +--------------+---------+------+-----------+------------+-------------+ GSV mid thigh no                            0.15                  +--------------+---------+------+-----------+------------+-------------+ GSV dist thighno                            0.14    out of fascia +--------------+---------+------+-----------+------------+-------------+ GSV at knee   no                            0.17                  +--------------+---------+------+-----------+------------+-------------+ GSV prox calf           yes    >500 ms      0.18                  +--------------+---------+------+-----------+------------+-------------+  SSV Pop Fossa no                            0.19                  +--------------+---------+------+-----------+------------+-------------+ SSV prox calf no                            0.13                  +--------------+---------+------+-----------+------------+-------------+ SSV mid calf  no                             0.20                  +--------------+---------+------+-----------+------------+-------------+  +--------------+---------+------+-----------+------------+-------------+ LEFT          Reflux NoRefluxReflux TimeDiameter cmsComments                              Yes                                       +--------------+---------+------+-----------+------------+-------------+ CFV           no                                                  +--------------+---------+------+-----------+------------+-------------+ FV mid        no                                                  +--------------+---------+------+-----------+------------+-------------+ Popliteal     no                                                  +--------------+---------+------+-----------+------------+-------------+ GSV at [facility]              yes    >500 ms      0.67                  +--------------+---------+------+-----------+------------+-------------+ GSV prox thighno                            0.29                  +--------------+---------+------+-----------+------------+-------------+ GSV mid thigh no                            0.28    out of fascia +--------------+---------+------+-----------+------------+-------------+ GSV dist thighno                            0.23                  +--------------+---------+------+-----------+------------+-------------+ GSV at  knee             yes    >500 ms      0.16                  +--------------+---------+------+-----------+------------+-------------+ GSV prox calf           yes    >500 ms      0.16                  +--------------+---------+------+-----------+------------+-------------+ SSV Pop Fossa           yes    >500 ms      0.33                  +--------------+---------+------+-----------+------------+-------------+ SSV prox calf           yes    >500 ms      0.17                   +--------------+---------+------+-----------+------------+-------------+ SSV mid calf            yes    >500 ms      0.24                  +--------------+---------+------+-----------+------------+-------------+   Summary: Bilateral: - No evidence of deep vein thrombosis seen in the lower extremities, bilaterally, from the common femoral through the popliteal veins. - No evidence of superficial venous thrombosis in the lower extremities, bilaterally.  Right: - Venous reflux is noted in the right greater saphenous vein in the calf.  Left: - Venous reflux is noted in the left sapheno-femoral junction. - Venous reflux is noted in the left greater saphenous vein at the knee and in the calf. - Venous reflux is noted in the left short saphenous vein.  *See table(s) above for measurements and observations. Electronically signed by Servando Snare MD on 09/28/2020 at 2:15:06 PM.    Final         Discharge Exam: Vitals:   10/05/20 2122 10/06/20 0510  BP: (!) 136/57 (!) 129/51  Pulse: 87 91  Resp: 19 19  Temp: 97.6 F (36.4 C) 97.8 F (36.6 C)  SpO2: 98% 98%   Vitals:   10/05/20 0536 10/05/20 1315 10/05/20 2122 10/06/20 0510  BP: (!) 136/59 133/67 (!) 136/57 (!) 129/51  Pulse: 82 72 87 91  Resp: _0 Temp: 98.7 F (37.1 C) 98.3 F (36.8 C) 97.6 F (36.4 C) 97.8 F (36.6 C)  TempSrc: Oral Oral Oral Oral  SpO2: 97% 98% 98% 98%  Weight:      Height:        General: Pt is alert, awake, not in acute distress Cardiovascular: RRR, S1/S2 +, no rubs, no gallops Respiratory: CTA bilaterally, no wheezing, no rhonchi Abdominal: Soft, NT, ND, bowel sounds + Extremities: 2+ LE edema, no cyanosis   The results of significant diagnostics from this hospitalization (including imaging, microbiology, ancillary and laboratory) are listed below for reference.    Significant Diagnostic Studies: DG Chest 2 View  Result Date: 10/02/2020 CLINICAL DATA:  Weakness. EXAM: CHEST - 2 VIEW COMPARISON:   Radiograph 09/23/2020 FINDINGS: Lower lung volumes from prior exam. Upper normal heart size. No focal airspace disease, pleural effusion, or pneumothorax. Stable osseous structures. IMPRESSION: Low lung volumes without acute findings. Electronically Signed   By: Keith Rake M.D.   On: 10/02/2020 22:56   US RENAL  Result Date: 10/04/2020 CLINICAL DATA:  Acute kidney  injury EXAM: RENAL / URINARY TRACT ULTRASOUND COMPLETE COMPARISON:  None. FINDINGS: Right Kidney: Renal measurements: 10.6 x 4.9 x 5.1 cm = volume: 138.3 mL. Echogenicity and renal cortical thickness are within normal limits. No mass, perinephric fluid, or hydronephrosis visualized. No sonographically demonstrable calculus or ureterectasis. Left Kidney: Renal measurements: 8.1 x 3.5 x 3.6 cm = volume: 53.8 mL. Echogenicity within normal limits. There is renal cortical thinning. No mass, perinephric fluid, or hydronephrosis visualized. No sonographically demonstrable calculus or ureterectasis. Bladder: Appears normal for degree of bladder distention. Other: Liver echogenicity increased diffusely. IMPRESSION: 1. Small right kidney with renal cortical thinning. Etiology for these changes uncertain. Renal artery stenosis is a potential etiology for these changes. In this regard, question whether patient is hypertensive. 2. No obstructing focus in either kidney. Renal echogenicity within normal limits bilaterally. 3. Increased liver echogenicity, a finding likely due to hepatic steatosis. Electronically Signed   By: Lowella Grip III M.D.   On: 10/04/2020 10:36   DG Chest Portable 1 View  Result Date: 09/23/2020 CLINICAL DATA:  Increased peripheral edema EXAM: PORTABLE CHEST 1 VIEW COMPARISON:  07/11/2020 FINDINGS: Heart is borderline in size. Lingular scarring or atelectasis at the left base. Right lung clear. No effusions or edema. No acute bony abnormality. IMPRESSION: Lingular scarring or atelectasis.  No active disease. Electronically  Signed   By: Rolm Baptise M.D.   On: 09/23/2020 10:41   VAS [place] LOWER EXTREMITY VENOUS REFLUX  Result Date: 09/28/2020  Lower Venous Reflux Study Indications: Pain, and Swelling.  Limitations: Poor ultrasound/tissue interface and poor positioning. Comparison Study: 01/31/2011: No evidence of deep or superficial thrombosis                   bilaterally Performing Technologist: Ivan Croft  Examination Guidelines: A complete evaluation includes B-mode imaging, spectral Doppler, color Doppler, and power Doppler as needed of all accessible portions of each vessel. Bilateral testing is considered an integral part of a complete examination. Limited examinations for reoccurring indications may be performed as noted. The reflux portion of the exam is performed with the patient in reverse Trendelenburg. Significant venous reflux is defined as >500 ms in the superficial venous system, and >1 second in the deep venous system.  Venous Reflux Times +--------------+---------+------+-----------+------------+-------------+ RIGHT         Reflux NoRefluxReflux TimeDiameter cmsComments                              Yes                                       +--------------+---------+------+-----------+------------+-------------+ CFV           no                                                  +--------------+---------+------+-----------+------------+-------------+ FV mid        no                                                  +--------------+---------+------+-----------+------------+-------------+ Popliteal     no                                                  +--------------+---------+------+-----------+------------+-------------+  GSV at [facility]    no                            0.51                  +--------------+---------+------+-----------+------------+-------------+ GSV prox thighno                            0.27                   +--------------+---------+------+-----------+------------+-------------+ GSV mid thigh no                            0.15                  +--------------+---------+------+-----------+------------+-------------+ GSV dist thighno                            0.14    out of fascia +--------------+---------+------+-----------+------------+-------------+ GSV at knee   no                            0.17                  +--------------+---------+------+-----------+------------+-------------+ GSV prox calf           yes    >500 ms      0.18                  +--------------+---------+------+-----------+------------+-------------+ SSV Pop Fossa no                            0.19                  +--------------+---------+------+-----------+------------+-------------+ SSV prox calf no                            0.13                  +--------------+---------+------+-----------+------------+-------------+ SSV mid calf  no                            0.20                  +--------------+---------+------+-----------+------------+-------------+  +--------------+---------+------+-----------+------------+-------------+ LEFT          Reflux NoRefluxReflux TimeDiameter cmsComments                              Yes                                       +--------------+---------+------+-----------+------------+-------------+ CFV           no                                                  +--------------+---------+------+-----------+------------+-------------+ FV mid        no                                                  +--------------+---------+------+-----------+------------+-------------+  Popliteal     no                                                  +--------------+---------+------+-----------+------------+-------------+ GSV at [facility]              yes    >500 ms      0.67                   +--------------+---------+------+-----------+------------+-------------+ GSV prox thighno                            0.29                  +--------------+---------+------+-----------+------------+-------------+ GSV mid thigh no                            0.28    out of fascia +--------------+---------+------+-----------+------------+-------------+ GSV dist thighno                            0.23                  +--------------+---------+------+-----------+------------+-------------+ GSV at knee             yes    >500 ms      0.16                  +--------------+---------+------+-----------+------------+-------------+ GSV prox calf           yes    >500 ms      0.16                  +--------------+---------+------+-----------+------------+-------------+ SSV Pop Fossa           yes    >500 ms      0.33                  +--------------+---------+------+-----------+------------+-------------+ SSV prox calf           yes    >500 ms      0.17                  +--------------+---------+------+-----------+------------+-------------+ SSV mid calf            yes    >500 ms      0.24                  +--------------+---------+------+-----------+------------+-------------+   Summary: Bilateral: - No evidence of deep vein thrombosis seen in the lower extremities, bilaterally, from the common femoral through the popliteal veins. - No evidence of superficial venous thrombosis in the lower extremities, bilaterally.  Right: - Venous reflux is noted in the right greater saphenous vein in the calf.  Left: - Venous reflux is noted in the left sapheno-femoral junction. - Venous reflux is noted in the left greater saphenous vein at the knee and in the calf. - Venous reflux is noted in the left short saphenous vein.  *See table(s) above for measurements and observations. Electronically signed by Servando Snare MD on 09/28/2020 at 2:15:06 PM.    Final      Microbiology: Recent  Results (from the past 240 hour(s))  Resp Panel by RT-PCR (Flu A&B, Covid) Nasopharyngeal Swab  Status: None   Collection Time: 10/02/20  8:57 PM   Specimen: Nasopharyngeal Swab; Nasopharyngeal(NP) swabs in vial transport medium  Result Value Ref Range Status   SARS Coronavirus 2 by RT PCR NEGATIVE NEGATIVE Final    Comment: (NOTE) SARS-CoV-2 target nucleic acids are NOT DETECTED.  The SARS-CoV-2 RNA is generally detectable in upper respiratory specimens during the acute phase of infection. The lowest concentration of SARS-CoV-2 viral copies this assay can detect is 138 copies/mL. A negative result does not preclude SARS-Cov-2 infection and should not be used as the sole basis for treatment or other patient management decisions. A negative result may occur with  improper specimen collection/handling, submission of specimen other than nasopharyngeal swab, presence of viral mutation(s) within the areas targeted by this assay, and inadequate number of viral copies(<138 copies/mL). A negative result must be combined with clinical observations, patient history, and epidemiological information. The expected result is Negative.  Fact Sheet for Patients:  EntrepreneurPulse.com.au  Fact Sheet for Healthcare Providers:  IncredibleEmployment.be  This test is no t yet approved or cleared by the [place] FDA and  has been authorized for detection and/or diagnosis of SARS-CoV-2 by FDA under an Emergency Use Authorization (EUA). This EUA will remain  in effect (meaning this test can be used) for the duration of the COVID-19 declaration under Section 564(b)(1) of the Act, 21 U.S.C.section 360bbb-3(b)(1), unless the authorization is terminated  or revoked sooner.       Influenza A by PCR NEGATIVE NEGATIVE Final   Influenza B by PCR NEGATIVE NEGATIVE Final    Comment: (NOTE) The Xpert Xpress SARS-CoV-2/FLU/RSV plus assay is intended as an aid in the  diagnosis of influenza from Nasopharyngeal swab specimens and should not be used as a sole basis for treatment. Nasal washings and aspirates are unacceptable for Xpert Xpress SARS-CoV-2/FLU/RSV testing.  Fact Sheet for Patients: EntrepreneurPulse.com.au  Fact Sheet for Healthcare Providers: IncredibleEmployment.be  This test is not yet approved or cleared by the [place] FDA and has been authorized for detection and/or diagnosis of SARS-CoV-2 by FDA under an Emergency Use Authorization (EUA). This EUA will remain in effect (meaning this test can be used) for the duration of the COVID-19 declaration under Section 564(b)(1) of the Act, 21 U.S.C. section 360bbb-3(b)(1), unless the authorization is terminated or revoked.  Performed at [facility], [address] [place], [place] [address]   Culture, Urine     Status: Abnormal   Collection Time: 10/03/20  4:46 PM   Specimen: Urine, Clean Catch  Result Value Ref Range Status   Specimen Description   Final    URINE, CLEAN CATCH Performed at [facility], [address], [place], [place] [address]    Special Requests   Final    NONE Performed at [facility], [address] [place],  [address]    Culture >=100,000 COLONIES/mL LACTOBACILLUS SPECIES (A)  Final   Report Status 10/05/2020 FINAL  Final     Labs: Basic Metabolic Panel: Recent Labs  Lab 10/02/20 2052 10/03/20 0448 10/04/20 0525 10/05/20 0554 10/06/20 0604  NA 130* 132* 133* 136 139  K 4.3 4.3 4.7 4.7 5.0  CL 94* 99 102 105 106  CO2 21* _0 GLUCOSE 126* 95 92 85 83  BUN 74* 71* 60* 51* 44*  CREATININE 3.63* 3.00* 2.09* 1.73* 1.44*  CALCIUM 8.3* 8.3* 8.2* 8.3* 8.6*  MG  --  1.5*  --  1.7 1.8  PHOS  --  3.6  --   --   --  Liver Function Tests: Recent Labs  Lab 10/03/20 0448 10/04/20 0525  AST 12* 10*  ALT 14 12  ALKPHOS 98 91  BILITOT 0.6 0.4  PROT 5.9* 5.5*  ALBUMIN 2.5* 2.2*   No results  for input(s): LIPASE, AMYLASE in the last 168 hours. No results for input(s): AMMONIA in the last 168 hours. CBC: Recent Labs  Lab 10/02/20 2052 10/03/20 0448 10/04/20 0525  WBC 12.3* 11.2* 10.4  NEUTROABS 8.6*  --  8.0*  HGB 10.4* 9.3* 8.8*  HCT 32.1* 28.8* 27.5*  MCV 92.2 93.5 93.5  PLT 521* 505* 460*   Cardiac Enzymes: Recent Labs  Lab 10/03/20 1535  CKTOTAL 20*   BNP: Invalid input(s): POCBNP CBG: Recent Labs  Lab 10/05/20 0757 10/05/20 1116 10/05/20 1608 10/05/20 2204 10/06/20 0740  GLUCAP 101* 173* 165* 135* 80    Time coordinating discharge:  36 minutes  Signed:  Orson Eva, DO [facility] Pager: [phone] 10/06/2020, 9:41 AM

## 2020-10-06 LAB — BASIC METABOLIC PANEL WITH GFR
Anion gap: 8 (ref 5–15)
BUN: 44 mg/dL — ABNORMAL HIGH (ref 8–23)
CO2: 25 mmol/L (ref 22–32)
Calcium: 8.6 mg/dL — ABNORMAL LOW (ref 8.9–10.3)
Chloride: 106 mmol/L (ref 98–111)
Creatinine, Ser: 1.44 mg/dL — ABNORMAL HIGH (ref 0.44–1.00)
GFR, Estimated: 39 mL/min — ABNORMAL LOW
Glucose, Bld: 83 mg/dL (ref 70–99)
Potassium: 5 mmol/L (ref 3.5–5.1)
Sodium: 139 mmol/L (ref 135–145)

## 2020-10-06 LAB — GLUCOSE, CAPILLARY
Glucose-Capillary: 80 mg/dL (ref 70–99)
Glucose-Capillary: 136 mg/dL — ABNORMAL HIGH (ref 70–99)

## 2020-10-06 LAB — MAGNESIUM: Magnesium: 1.8 mg/dL (ref 1.7–2.4)

## 2020-10-06 MED ORDER — SODIUM ZIRCONIUM CYCLOSILICATE 10 G PO PACK
10.0000 g | PACK | Freq: Once | ORAL | Status: AC
  Administered 2020-10-06: 10 g via ORAL
  Filled 2020-10-06: qty 1

## 2020-10-06 NOTE — Care Management Important Message (Signed)
Important Message  Patient Details  Name: [name] MRN: [id number] Date of Birth: 1950/10/16   Medicare Important Message Given:  Yes     Tommy Medal 10/06/2020, 12:03 PM

## 2020-10-06 NOTE — TOC Transition Note (Signed)
Transition of Care [facility]) - CM/SW Discharge Note   Patient Details  Name: [name] MRN: [id number] Date of Birth: 04/19/1951  Transition of Care [facility]) CM/SW Contact:  Boneta Lucks, RN Phone Number: 10/06/2020, 2:27 PM   Clinical Narrative:   [name]  Called five home health care agencies, they all either had staffing issues or would not take due to [name].  Patient walked 80 feet, needs Unna boot. Outpatient PT will accept for wound care and PT. They do not do unna boots. Patient already has outpatient appointment for lymphedema 3/25. TOC ordered and noted to see sooner if possible,  Nurse updated to do wound care with family before discharge.   Final next level of care: Home/Self Care Barriers to Discharge: No [facility] will accept this patient   Patient Goals and CMS Choice Patient states their goals for this hospitalization and ongoing recovery are:: Return home CMS Medicare.gov Compare Post Acute Care list provided to:: Patient Choice offered to / list presented to : Patient  Discharge Placement        Patient to be transferred to facility by: Family Name of family member notified: [name] - daughter Patient and family notified of of transfer: 10/06/20  Discharge Plan and Services   Discharge Planning Services: NA Post Acute Care Choice: NA            DME Agency: NA    HH Arranged: NA [name]: NA    Readmission Risk Interventions Readmission Risk Prevention Plan 10/04/2020  Transportation Screening Complete  PCP or Specialist Appt within 5-7 Days Complete  Home Care Screening Complete  Medication Review (RN CM) Complete  Some recent data might be hidden

## 2020-10-06 NOTE — Consult Note (Signed)
WOC Nurse Consult Note: Patient receiving care in [facility] 309. Reason for Consult: Instruct RN on leg wound care  Wound type: BLE venous stasis  Dressing procedure/placement/frequency:  Primary RN to demonstrate to family/caregiver of patient how to perform leg wraps as currently outlined under wound care orders. Provide supplies for discharge. Home Health RN will apply unna boots when Home Health is initiated. [facility] nurse will not follow at this time.  Please re-consult the [place] team if needed.  Val Riles, RN, MSN, CWOCN, CNS-BC, pager [phone]

## 2020-10-07 ENCOUNTER — Telehealth: Payer: Self-pay | Admitting: *Deleted

## 2020-10-07 ENCOUNTER — Other Ambulatory Visit: Payer: Self-pay

## 2020-10-07 DIAGNOSIS — N1832 Chronic kidney disease, stage 3b: Secondary | ICD-10-CM

## 2020-10-07 DIAGNOSIS — N179 Acute kidney failure, unspecified: Secondary | ICD-10-CM

## 2020-10-07 NOTE — Chronic Care Management (AMB) (Signed)
  Chronic Care Management   Outreach Note  10/07/2020 Name: [name] MRN: [id number] DOB: March 30, 1951  [name] is a 70 y.o. year old female who is a primary care patient of [place], Modena Nunnery, MD. I reached out to [name] by phone today in response to a referral sent by [name]'s PCP, Buelah Manis, Modena Nunnery, MD     An unsuccessful telephone outreach was attempted today. The patient was referred to the case management team for assistance with care management and care coordination.   Follow Up Plan: The care management team will reach out to the patient again over the next 7 days.  If patient returns call to provider office, please advise to call Shawnee Lysle Morales at [facility]

## 2020-10-12 ENCOUNTER — Telehealth: Payer: Self-pay | Admitting: Diagnostic Neuroimaging

## 2020-10-12 ENCOUNTER — Ambulatory Visit (HOSPITAL_COMMUNITY): Payer: Medicare Other

## 2020-10-12 NOTE — Telephone Encounter (Signed)
Unable to reach pt to reschedule 3/29 appointment due to MD being out of office. PT's phone # not accepting calls.

## 2020-10-14 DIAGNOSIS — I10 Essential (primary) hypertension: Secondary | ICD-10-CM | POA: Diagnosis not present

## 2020-10-14 NOTE — Chronic Care Management (AMB) (Signed)
  Chronic Care Management   Outreach Note  10/14/2020 Name: [name] MRN: [id number] DOB: [birth year]  [name] is a 70 y.o. year old female who is a primary care patient of [place], Modena Nunnery, MD. I reached out to [name] by phone today in response to a referral sent by [name]'s PCP, [place], Modena Nunnery, MD     A second unsuccessful telephone outreach was attempted today. The patient was referred to the case management team for assistance with care management and care coordination.   Follow Up Plan:  The care management team will reach out to the patient again over the next 7 days.  If patient returns call to provider office, please advise to call Opal Lysle Morales at [facility]

## 2020-10-15 ENCOUNTER — Other Ambulatory Visit: Payer: Self-pay | Admitting: Family Medicine

## 2020-10-16 ENCOUNTER — Ambulatory Visit (HOSPITAL_COMMUNITY): Payer: Medicare Other | Admitting: Physical Therapy

## 2020-10-18 ENCOUNTER — Inpatient Hospital Stay (HOSPITAL_COMMUNITY)
Admission: EM | Admit: 2020-10-18 | Discharge: 2020-10-20 | DRG: 602 | Disposition: A | Discharge status: Home / Self Care | Payer: Medicare Other | Attending: Internal Medicine | Admitting: Internal Medicine

## 2020-10-18 ENCOUNTER — Encounter (HOSPITAL_COMMUNITY): Payer: Self-pay | Admitting: Emergency Medicine

## 2020-10-18 ENCOUNTER — Emergency Department (HOSPITAL_COMMUNITY): Payer: Medicare Other

## 2020-10-18 ENCOUNTER — Other Ambulatory Visit: Payer: Self-pay

## 2020-10-18 DIAGNOSIS — E662 Morbid (severe) obesity with alveolar hypoventilation: Secondary | ICD-10-CM | POA: Diagnosis present

## 2020-10-18 DIAGNOSIS — I872 Venous insufficiency (chronic) (peripheral): Secondary | ICD-10-CM | POA: Diagnosis not present

## 2020-10-18 DIAGNOSIS — Z87891 Personal history of nicotine dependence: Secondary | ICD-10-CM

## 2020-10-18 DIAGNOSIS — Z79891 Long term (current) use of opiate analgesic: Secondary | ICD-10-CM

## 2020-10-18 DIAGNOSIS — M7989 Other specified soft tissue disorders: Secondary | ICD-10-CM | POA: Diagnosis not present

## 2020-10-18 DIAGNOSIS — F419 Anxiety disorder, unspecified: Secondary | ICD-10-CM | POA: Diagnosis present

## 2020-10-18 DIAGNOSIS — I129 Hypertensive chronic kidney disease with stage 1 through stage 4 chronic kidney disease, or unspecified chronic kidney disease: Secondary | ICD-10-CM | POA: Diagnosis not present

## 2020-10-18 DIAGNOSIS — N1832 Chronic kidney disease, stage 3b: Secondary | ICD-10-CM | POA: Diagnosis not present

## 2020-10-18 DIAGNOSIS — E669 Obesity, unspecified: Secondary | ICD-10-CM | POA: Diagnosis not present

## 2020-10-18 DIAGNOSIS — Z7984 Long term (current) use of oral hypoglycemic drugs: Secondary | ICD-10-CM

## 2020-10-18 DIAGNOSIS — Z6841 Body Mass Index (BMI) 40.0 and over, adult: Secondary | ICD-10-CM

## 2020-10-18 DIAGNOSIS — L02415 Cutaneous abscess of right lower limb: Principal | ICD-10-CM | POA: Diagnosis present

## 2020-10-18 DIAGNOSIS — R600 Localized edema: Secondary | ICD-10-CM

## 2020-10-18 DIAGNOSIS — J9621 Acute and chronic respiratory failure with hypoxia: Secondary | ICD-10-CM | POA: Diagnosis present

## 2020-10-18 DIAGNOSIS — Z7982 Long term (current) use of aspirin: Secondary | ICD-10-CM | POA: Diagnosis not present

## 2020-10-18 DIAGNOSIS — E0922 Drug or chemical induced diabetes mellitus with diabetic chronic kidney disease: Secondary | ICD-10-CM | POA: Diagnosis not present

## 2020-10-18 DIAGNOSIS — J45909 Unspecified asthma, uncomplicated: Secondary | ICD-10-CM | POA: Diagnosis present

## 2020-10-18 DIAGNOSIS — L03115 Cellulitis of right lower limb: Secondary | ICD-10-CM | POA: Diagnosis not present

## 2020-10-18 DIAGNOSIS — G7000 Myasthenia gravis without (acute) exacerbation: Secondary | ICD-10-CM | POA: Diagnosis present

## 2020-10-18 DIAGNOSIS — F32A Depression, unspecified: Secondary | ICD-10-CM | POA: Diagnosis not present

## 2020-10-18 DIAGNOSIS — N182 Chronic kidney disease, stage 2 (mild): Secondary | ICD-10-CM

## 2020-10-18 DIAGNOSIS — I10 Essential (primary) hypertension: Secondary | ICD-10-CM | POA: Diagnosis not present

## 2020-10-18 DIAGNOSIS — G8929 Other chronic pain: Secondary | ICD-10-CM | POA: Diagnosis not present

## 2020-10-18 DIAGNOSIS — M1A9XX0 Chronic gout, unspecified, without tophus (tophi): Secondary | ICD-10-CM | POA: Diagnosis not present

## 2020-10-18 DIAGNOSIS — J449 Chronic obstructive pulmonary disease, unspecified: Secondary | ICD-10-CM | POA: Diagnosis not present

## 2020-10-18 DIAGNOSIS — R609 Edema, unspecified: Secondary | ICD-10-CM | POA: Diagnosis present

## 2020-10-18 DIAGNOSIS — Z79899 Other long term (current) drug therapy: Secondary | ICD-10-CM

## 2020-10-18 DIAGNOSIS — N189 Chronic kidney disease, unspecified: Secondary | ICD-10-CM | POA: Diagnosis present

## 2020-10-18 DIAGNOSIS — E099 Drug or chemical induced diabetes mellitus without complications: Secondary | ICD-10-CM | POA: Diagnosis not present

## 2020-10-18 DIAGNOSIS — E785 Hyperlipidemia, unspecified: Secondary | ICD-10-CM | POA: Diagnosis present

## 2020-10-18 DIAGNOSIS — Z20822 Contact with and (suspected) exposure to covid-19: Secondary | ICD-10-CM | POA: Diagnosis present

## 2020-10-18 DIAGNOSIS — Z8249 Family history of ischemic heart disease and other diseases of the circulatory system: Secondary | ICD-10-CM | POA: Diagnosis not present

## 2020-10-18 DIAGNOSIS — B961 Klebsiella pneumoniae [K. pneumoniae] as the cause of diseases classified elsewhere: Secondary | ICD-10-CM | POA: Diagnosis present

## 2020-10-18 DIAGNOSIS — T380X5S Adverse effect of glucocorticoids and synthetic analogues, sequela: Secondary | ICD-10-CM

## 2020-10-18 DIAGNOSIS — D849 Immunodeficiency, unspecified: Secondary | ICD-10-CM | POA: Diagnosis not present

## 2020-10-18 DIAGNOSIS — Z9981 Dependence on supplemental oxygen: Secondary | ICD-10-CM

## 2020-10-18 DIAGNOSIS — T380X5A Adverse effect of glucocorticoids and synthetic analogues, initial encounter: Secondary | ICD-10-CM | POA: Diagnosis present

## 2020-10-18 DIAGNOSIS — M109 Gout, unspecified: Secondary | ICD-10-CM | POA: Diagnosis present

## 2020-10-18 DIAGNOSIS — K219 Gastro-esophageal reflux disease without esophagitis: Secondary | ICD-10-CM | POA: Diagnosis present

## 2020-10-18 DIAGNOSIS — Z7952 Long term (current) use of systemic steroids: Secondary | ICD-10-CM

## 2020-10-18 DIAGNOSIS — M19071 Primary osteoarthritis, right ankle and foot: Secondary | ICD-10-CM | POA: Diagnosis not present

## 2020-10-18 DIAGNOSIS — E66813 Obesity, class 3: Secondary | ICD-10-CM | POA: Diagnosis present

## 2020-10-18 LAB — CBC WITH DIFFERENTIAL/PLATELET
Abs Immature Granulocytes: 0.05 10*3/uL (ref 0.00–0.07)
Basophils Absolute: 0.1 10*3/uL (ref 0.0–0.1)
Basophils Relative: 1 %
Eosinophils Absolute: 0.3 10*3/uL (ref 0.0–0.5)
Eosinophils Relative: 3 %
HCT: 33.4 % — ABNORMAL LOW (ref 36.0–46.0)
Hemoglobin: 10.5 g/dL — ABNORMAL LOW (ref 12.0–15.0)
Immature Granulocytes: 1 %
Lymphocytes Relative: 24 %
Lymphs Abs: 2.4 10*3/uL (ref 0.7–4.0)
MCH: 30.2 pg (ref 26.0–34.0)
MCHC: 31.4 g/dL (ref 30.0–36.0)
MCV: 96 fL (ref 80.0–100.0)
Monocytes Absolute: 1.3 10*3/uL — ABNORMAL HIGH (ref 0.1–1.0)
Monocytes Relative: 12 %
Neutro Abs: 6.1 10*3/uL (ref 1.7–7.7)
Neutrophils Relative %: 59 %
Platelets: 313 10*3/uL (ref 150–400)
RBC: 3.48 MIL/uL — ABNORMAL LOW (ref 3.87–5.11)
RDW: 17.4 % — ABNORMAL HIGH (ref 11.5–15.5)
WBC: 10.2 10*3/uL (ref 4.0–10.5)
nRBC: 0 % (ref 0.0–0.2)

## 2020-10-18 LAB — COMPREHENSIVE METABOLIC PANEL
ALT: 12 U/L (ref 0–44)
AST: 13 U/L — ABNORMAL LOW (ref 15–41)
Albumin: 3 g/dL — ABNORMAL LOW (ref 3.5–5.0)
Alkaline Phosphatase: 100 U/L (ref 38–126)
Anion gap: 10 (ref 5–15)
BUN: 22 mg/dL (ref 8–23)
CO2: 24 mmol/L (ref 22–32)
Calcium: 8.7 mg/dL — ABNORMAL LOW (ref 8.9–10.3)
Chloride: 102 mmol/L (ref 98–111)
Creatinine, Ser: 1.35 mg/dL — ABNORMAL HIGH (ref 0.44–1.00)
GFR, Estimated: 42 mL/min — ABNORMAL LOW (ref >60)
Glucose, Bld: 109 mg/dL — ABNORMAL HIGH (ref 70–99)
Potassium: 4.8 mmol/L (ref 3.5–5.1)
Sodium: 136 mmol/L (ref 135–145)
Total Bilirubin: 0.5 mg/dL (ref 0.3–1.2)
Total Protein: 6.6 g/dL (ref 6.5–8.1)

## 2020-10-18 LAB — RESP PANEL BY RT-PCR (FLU A&B, COVID) ARPGX2
Influenza A by PCR: NEGATIVE
Influenza B by PCR: NEGATIVE
SARS Coronavirus 2 by RT PCR: NEGATIVE

## 2020-10-18 LAB — GLUCOSE, CAPILLARY: Glucose-Capillary: 83 mg/dL (ref 70–99)

## 2020-10-18 MED ORDER — DOXYCYCLINE HYCLATE 100 MG PO TABS
100.0000 mg | ORAL_TABLET | Freq: Once | ORAL | Status: AC
  Administered 2020-10-18: 100 mg via ORAL
  Filled 2020-10-18: qty 1

## 2020-10-18 MED ORDER — HEPARIN SODIUM (PORCINE) 5000 UNIT/ML IJ SOLN
5000.0000 [IU] | Freq: Three times a day (TID) | INTRAMUSCULAR | Status: DC
  Administered 2020-10-18 – 2020-10-20 (×5): 5000 [IU] via SUBCUTANEOUS
  Filled 2020-10-18 (×6): qty 1

## 2020-10-18 MED ORDER — DIPHENHYDRAMINE HCL 25 MG PO CAPS
25.0000 mg | ORAL_CAPSULE | Freq: Every day | ORAL | Status: DC
  Administered 2020-10-18 – 2020-10-20 (×3): 25 mg via ORAL
  Filled 2020-10-18 (×7): qty 1

## 2020-10-18 MED ORDER — VANCOMYCIN HCL IN DEXTROSE 1-5 GM/200ML-% IV SOLN
1000.0000 mg | Freq: Once | INTRAVENOUS | Status: DC
  Filled 2020-10-18: qty 200

## 2020-10-18 MED ORDER — LIDOCAINE-EPINEPHRINE 1 %-1:100000 IJ SOLN
10.0000 mL | Freq: Once | INTRAMUSCULAR | Status: DC
  Filled 2020-10-18: qty 10

## 2020-10-18 MED ORDER — BUSPIRONE HCL 5 MG PO TABS
5.0000 mg | ORAL_TABLET | Freq: Two times a day (BID) | ORAL | Status: DC
  Administered 2020-10-18 – 2020-10-20 (×4): 5 mg via ORAL
  Filled 2020-10-18 (×4): qty 1

## 2020-10-18 MED ORDER — PIPERACILLIN-TAZOBACTAM 3.375 G IVPB
3.3750 g | Freq: Three times a day (TID) | INTRAVENOUS | Status: DC
  Administered 2020-10-18 – 2020-10-20 (×5): 3.375 g via INTRAVENOUS
  Filled 2020-10-18 (×6): qty 50

## 2020-10-18 MED ORDER — LIDOCAINE-EPINEPHRINE (PF) 2 %-1:200000 IJ SOLN: 20.0000 mL | Freq: Once | INTRAMUSCULAR | Status: AC

## 2020-10-18 MED ORDER — TRAZODONE HCL 50 MG PO TABS
100.0000 mg | ORAL_TABLET | Freq: Every evening | ORAL | Status: DC | PRN
  Administered 2020-10-19: 100 mg via ORAL
  Filled 2020-10-18: qty 2

## 2020-10-18 MED ORDER — SODIUM CHLORIDE 0.9% FLUSH: 3.0000 mL | INTRAVENOUS | Status: DC | PRN

## 2020-10-18 MED ORDER — ACETAMINOPHEN 500 MG PO TABS
1000.0000 mg | ORAL_TABLET | Freq: Four times a day (QID) | ORAL | Status: DC | PRN
  Administered 2020-10-19 – 2020-10-20 (×4): 1000 mg via ORAL
  Filled 2020-10-18 (×4): qty 2

## 2020-10-18 MED ORDER — ALBUTEROL SULFATE HFA 108 (90 BASE) MCG/ACT IN AERS: 2.0000 | INHALATION_SPRAY | RESPIRATORY_TRACT | Status: DC | PRN

## 2020-10-18 MED ORDER — PYRIDOSTIGMINE BROMIDE ER 180 MG PO TBCR
180.0000 mg | EXTENDED_RELEASE_TABLET | Freq: Every day | ORAL | Status: DC
  Filled 2020-10-18 (×3): qty 1

## 2020-10-18 MED ORDER — ASPIRIN EC 81 MG PO TBEC
81.0000 mg | DELAYED_RELEASE_TABLET | Freq: Every evening | ORAL | Status: DC
  Administered 2020-10-18 – 2020-10-19 (×2): 81 mg via ORAL
  Filled 2020-10-18 (×2): qty 1

## 2020-10-18 MED ORDER — FUROSEMIDE 40 MG PO TABS
40.0000 mg | ORAL_TABLET | Freq: Every day | ORAL | Status: DC
  Administered 2020-10-19 – 2020-10-20 (×2): 40 mg via ORAL
  Filled 2020-10-18 (×2): qty 1

## 2020-10-18 MED ORDER — LIDOCAINE-EPINEPHRINE (PF) 2 %-1:200000 IJ SOLN
INTRAMUSCULAR | Status: AC
  Administered 2020-10-18: 5 mL
  Filled 2020-10-18: qty 20

## 2020-10-18 MED ORDER — SODIUM CHLORIDE 0.9 % IV SOLN: 250.0000 mL | INTRAVENOUS | Status: DC | PRN

## 2020-10-18 MED ORDER — VANCOMYCIN HCL 2000 MG/400ML IV SOLN
2000.0000 mg | Freq: Once | INTRAVENOUS | Status: AC
  Administered 2020-10-18: 2000 mg via INTRAVENOUS
  Filled 2020-10-18: qty 400

## 2020-10-18 MED ORDER — SODIUM CHLORIDE 0.9% FLUSH
3.0000 mL | Freq: Two times a day (BID) | INTRAVENOUS | Status: DC
  Administered 2020-10-20: 3 mL via INTRAVENOUS

## 2020-10-18 MED ORDER — NYSTATIN 100000 UNIT/GM EX POWD
1.0000 "application " | Freq: Three times a day (TID) | CUTANEOUS | Status: DC
  Administered 2020-10-18 – 2020-10-20 (×5): 1 via TOPICAL
  Filled 2020-10-18: qty 15

## 2020-10-18 MED ORDER — MAGNESIUM OXIDE 400 (241.3 MG) MG PO TABS
200.0000 mg | ORAL_TABLET | Freq: Every day | ORAL | Status: DC
Start: 2020-10-18 — End: 2020-10-20
  Administered 2020-10-18 – 2020-10-20 (×3): 200 mg via ORAL
  Filled 2020-10-18 (×7): qty 1

## 2020-10-18 MED ORDER — CLOTRIMAZOLE 1 % EX CREA
1.0000 "application " | TOPICAL_CREAM | Freq: Two times a day (BID) | CUTANEOUS | Status: DC
  Administered 2020-10-18 – 2020-10-20 (×4): 1 via TOPICAL
  Filled 2020-10-18 (×2): qty 15

## 2020-10-18 MED ORDER — ATORVASTATIN CALCIUM 40 MG PO TABS
40.0000 mg | ORAL_TABLET | Freq: Every day | ORAL | Status: DC
  Administered 2020-10-19 – 2020-10-20 (×2): 40 mg via ORAL
  Filled 2020-10-18 (×2): qty 1

## 2020-10-18 MED ORDER — ALLOPURINOL 100 MG PO TABS
100.0000 mg | ORAL_TABLET | Freq: Every day | ORAL | Status: DC
Start: 2020-10-18 — End: 2020-10-20
  Administered 2020-10-18 – 2020-10-20 (×3): 100 mg via ORAL
  Filled 2020-10-18 (×3): qty 1

## 2020-10-18 MED ORDER — INSULIN GLARGINE 100 UNIT/ML ~~LOC~~ SOLN
10.0000 [IU] | Freq: Every day | SUBCUTANEOUS | Status: DC
  Administered 2020-10-18 – 2020-10-19 (×2): 10 [IU] via SUBCUTANEOUS
  Filled 2020-10-18 (×3): qty 0.1

## 2020-10-18 MED ORDER — INSULIN ASPART 100 UNIT/ML ~~LOC~~ SOLN
0.0000 [IU] | Freq: Three times a day (TID) | SUBCUTANEOUS | Status: DC
  Administered 2020-10-19: 3 [IU] via SUBCUTANEOUS

## 2020-10-18 MED ORDER — HYDROCODONE-ACETAMINOPHEN 7.5-325 MG PO TABS
1.0000 | ORAL_TABLET | Freq: Two times a day (BID) | ORAL | Status: DC | PRN
  Administered 2020-10-18 – 2020-10-20 (×3): 1 via ORAL
  Filled 2020-10-18 (×4): qty 1

## 2020-10-18 MED ORDER — PANTOPRAZOLE SODIUM 40 MG PO TBEC
80.0000 mg | DELAYED_RELEASE_TABLET | Freq: Every day | ORAL | Status: DC
  Administered 2020-10-19 – 2020-10-20 (×2): 80 mg via ORAL
  Filled 2020-10-18 (×2): qty 2

## 2020-10-18 MED ORDER — VANCOMYCIN HCL 1250 MG/250ML IV SOLN
1250.0000 mg | INTRAVENOUS | Status: DC
  Administered 2020-10-19: 1250 mg via INTRAVENOUS
  Filled 2020-10-18: qty 250

## 2020-10-18 MED ORDER — POTASSIUM CHLORIDE CRYS ER 10 MEQ PO TBCR
10.0000 meq | EXTENDED_RELEASE_TABLET | Freq: Every day | ORAL | Status: DC
  Administered 2020-10-18 – 2020-10-20 (×3): 10 meq via ORAL
  Filled 2020-10-18 (×7): qty 1

## 2020-10-18 MED ORDER — PREDNISONE 20 MG PO TABS
30.0000 mg | ORAL_TABLET | Freq: Every day | ORAL | Status: DC
Start: 2020-10-19 — End: 2020-10-20
  Administered 2020-10-19 – 2020-10-20 (×2): 30 mg via ORAL
  Filled 2020-10-18 (×2): qty 2

## 2020-10-18 NOTE — ED Provider Notes (Signed)
[facility] EMERGENCY DEPARTMENT Provider Note   CSN: [id number] Arrival date & time: 10/18/20  1649     History Chief Complaint  Patient presents with  . Wound Check    [name] is a 70 y.o. female.  HPI   This patient is a 70 year old female, she has a body mass index of 42, she has hypertension but does not have diabetes, she is not very ambulatory and has been told to stay off of her feet because of her chronic edema  Unfortunately she has had multiple visits to the emergency department as well as admissions to the hospital because of these problems.  I have personally reviewed the medical record and it found several recent evaluations most recently was March 11 when she was admitted to the hospital with an acute kidney injury.  She had been taking furosemide 40 mg at that time but did not tolerate her Unna boots, she did not diurese appropriately and her creatinine worsened.  Ultimately the patient developed blistering lesions around her ankles, ultrasound was negative for DVT, blood work showed that her creatinine was increasing on 10 March up to 4.23 with a sodium of 130.  Blood counts were rather unremarkable at that time.  It was thought that her acute kidney injury was secondary to volume depletion creatinine was 1.4 on the day of discharge.  It was also thought that her peripheral edema was more related to chronic venous stasis rather than to fluid overload, but directions on how to use furosemide were not very clearly outlined in the discharge summary as far as I can tell.  She has had a family member who has been doing her dressing changes and today noticed that there was some increasing purulence around the right ankle.  The patient denies fevers or chills, nausea or vomiting chest pain coughing or shortness of breath.  She has chronic pain and swelling of her legs, this seems to be a little bit worse today.  Past Medical History:  Diagnosis Date  . Achilles  tendinitis   . Anxiety   . Asthma   . Depression   . GERD (gastroesophageal reflux disease)   . Gout   . H/O myasthenia gravis    left eye  . HTN (hypertension)   . Hyperlipidemia   . Low back pain   . Obesity hypoventilation syndrome ([place])   . Obstructive sleep apnea   . Osteoarthritis     Patient Active Problem List   Diagnosis Date Noted  . Dehydration 10/03/2020  . Leukocytosis 10/03/2020  . Thrombocytosis 10/03/2020  . Hyponatremia 10/03/2020  . Hypochloremia 10/03/2020  . Failure to thrive in adult 10/03/2020  . Acute renal failure superimposed on stage 3b chronic kidney disease ([place]) 10/03/2020  . Uncontrolled type 2 diabetes mellitus with hyperglycemia, without long-term current use of insulin ([place]) 10/03/2020  . Acute kidney injury superimposed on CKD ([place]) 10/02/2020  . Chronic kidney disease 10/01/2020  . 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 Chronic insomnia 01/08/2020  . Ocular myasthenia gravis ([place]) 09/18/2019  . Peripheral edema 01/23/2019  . Asthma 05/14/2015  . Heel spur 02/24/2015  . Depression 02/24/2015  . Post-menopausal bleeding 06/02/2014  . OA (osteoarthritis) of knee 04/09/2014  . Epidermal cyst 10/01/2013  . Class 3 obesity 02/25/2013  . Back pain 03/04/2012  . Gout 01/25/2012  . Edema 01/28/2011  . OBESITY HYPOVENTILATION SYNDROME 06/16/2008  . OBSTRUCTIVE SLEEP APNEA 05/08/2008  . Hyperlipidemia 08/28/2006  . Anxiety 08/28/2006  .  Essential hypertension 08/28/2006  . GERD 08/28/2006  . Osteoarthritis 08/28/2006    Past Surgical History:  Procedure Laterality Date  . CATARACT EXTRACTION W/PHACO Left 09/17/2015   Procedure: CATARACT EXTRACTION PHACO AND INTRAOCULAR LENS PLACEMENT LEFT EYE cde=8.58;  Surgeon: Tonny Branch, MD;  Location: [facility] ORS;  Service: Ophthalmology;  Laterality: Left;  . CATARACT EXTRACTION W/PHACO Right 05/15/2017   Procedure: CATARACT EXTRACTION PHACO AND INTRAOCULAR LENS PLACEMENT (IOC);  Surgeon:  Tonny Branch, MD;  Location: [facility] OR[facility];  Service: Ophthalmology;  Laterality: Right;  CDE: 6.34  . COLONOSCOPY N/A 12/25/2013   Procedure: COLONOSCOPY;  Surgeon: Rogene Houston, MD;  Location: [facility] ENDO SUITE;  Service: Endoscopy;  Laterality: N/A;  730-rescheduled Ann notified pt  . COLONOSCOPY WITH PROPOFOL N/A 10/04/2019   Procedure: COLONOSCOPY WITH PROPOFOL;  Surgeon: Rogene Houston, MD;  Location: [facility] END[facility] SUITE;  Service: Endoscopy;  Laterality: N/A;  815  . CYST EXCISION N/A 09/12/2016   Procedure: EXCISION SEBACEOUS CYST, BACK;  Surgeon: Aviva Signs, MD;  Location: [facility];  Service: General;  Laterality: N/A;  . INTRAOCULAR LENS INSERTION Bilateral 2018   Dr. Tonny Branch   . POLYPECTOMY  10/04/2019   Procedure: POLYPECTOMY;  Surgeon: Rogene Houston, MD;  Location: [facility];  Service: Endoscopy;;  . TUBAL LIGATION       OB History    Gravida  3   Para  3   Term  3   Preterm      AB      Living  3     SAB      IAB      Ectopic      Multiple      Live Births              Family History  Problem Relation Age of Onset  . Heart disease Mother   . Thyroid disease Mother   . Heart failure Father   . Lung disease Father   . Diabetes Brother   . Crohn's disease Daughter     Social History   Tobacco Use  . Smoking status: Former Smoker    Packs/day: 2.00    Years: 36.00    Pack years: 72.00    Types: Cigarettes    Start date: 06/25/1969    Quit date: 03/25/2005    Years since quitting: 15.5  . Smokeless tobacco: Never Used  Vaping Use  . Vaping Use: Never used  Substance Use Topics  . Alcohol use: No    Alcohol/week: 0.0 standard drinks  . Drug use: No    Home Medications Prior to Admission medications   Medication Sig Start Date End Date Taking? Authorizing Provider  acetaminophen (TYLENOL) 500 MG tablet Take 1,000 mg by mouth every 6 (six) hours as needed for mild pain.     [provider]  albuterol (VENTOLIN HFA) 108 (90 Base) MCG/ACT  inhaler INHALE 2 PUFFS INTO THE LUNGS EVERY 4 HOURS AS NEEDED FOR WHEEZING OR SHORTNESS OF BREATH Patient taking differently: Inhale 2 puffs into the lungs every 4 (four) hours as needed for wheezing or shortness of breath. 01/23/20   [place], Modena Nunnery, MD  allopurinol (ZYLOPRIM) 100 MG tablet TAKE 1 TABLET(100 MG) BY MOUTH DAILY Patient taking differently: Take 100 mg by mouth daily. 02/28/20   Alycia Rossetti, MD  aspirin EC 81 MG tablet Take 1 tablet (81 mg total) by mouth every evening. 10/05/19   Rogene Houston, MD  atorvastatin (LIPITOR) 40  MG tablet TAKE 1 TABLET(40 MG) BY MOUTH DAILY AT 6 PM Patient taking differently: Take 40 mg by mouth daily. 08/31/20   Alycia Rossetti, MD  Blood Glucose Monitoring Suppl (BLOOD GLUCOSE SYSTEM PAK) KIT Use as directed to monitor FSBS 3x weekly. Dx: R73.09. 05/25/20   Alycia Rossetti, MD  busPIRone (BUSPAR) 5 MG tablet TAKE 1 TABLET(5 MG) BY MOUTH TWICE DAILY FOR ANXIETY 10/16/20   Alycia Rossetti, MD  clotrimazole (LOTRIMIN) 1 % cream Apply 1 application topically 2 (two) times daily. 01/08/20   Alycia Rossetti, MD  diphenhydrAMINE (BENADRYL) 25 MG tablet Take 25 mg by mouth daily.    [provider]  fluticasone (CUTIVATE) 0.05 % cream APPLY EXTERNALLY TO THE AFFECTED AREA DAILY 08/13/18   Alycia Rossetti, MD  furosemide (LASIX) 40 MG tablet TAKE 1 TABLET(40 MG) BY MOUTH DAILY Patient taking differently: Take 40 mg by mouth daily. 09/08/20   [place], Modena Nunnery, MD  Glucose Blood (BLOOD GLUCOSE TEST STRIPS) STRP Use as directed to monitor FSBS 3x weekly. Dx: R73.09. 05/25/20   Alycia Rossetti, MD  HYDROcodone-acetaminophen (NORCO) 7.5-325 MG tablet Take 1 tablet by mouth 2 (two) times daily as needed for moderate pain. 09/30/20   Alycia Rossetti, MD  Lancets MISC Use as directed to monitor FSBS 3x weekly. Dx: R73.09. 05/25/20   Alycia Rossetti, MD  Magnesium 250 MG TABS Take 1 tablet (250 mg total) by mouth daily. Patient taking differently:  Take 250 mg by mouth daily in the afternoon. Midday 08/16/18   [place], Modena Nunnery, MD  metFORMIN (GLUCOPHAGE) 500 MG tablet Take 1 tablet (500 mg total) by mouth 2 (two) times daily with a meal. 08/12/20   [place], Modena Nunnery, MD  nystatin (MYCOSTATIN/NYSTOP) powder Apply 1 application topically 3 (three) times daily. 01/08/20   [place], Modena Nunnery, MD  omeprazole (PRILOSEC) 40 MG capsule TAKE 1 CAPSULE(40 MG) BY MOUTH DAILY Patient taking differently: Take 40 mg by mouth daily. 06/29/20   Alycia Rossetti, MD  OXYGEN Inhale 1 L into the lungs at bedtime. IN ADDITION TO CPAP NIGHTLY    [provider]  potassium chloride (KLOR-CON) 10 MEQ tablet TAKE 1 TABLET BY MOUTH DAILY WITH FLUID PILL Patient taking differently: Take 10 mEq by mouth daily. With fluid pill 09/01/20   Alycia Rossetti, MD  predniSONE (DELTASONE) 10 MG tablet Pt currently on 63m daily, start taking 535mdaily for one month, then decrease to 4024maily for 1 month, then decrease 47m52mily. Patient taking differently: Take 30 mg by mouth daily. 07/16/20   [place], RichNanine Means  pyridostigmine (MESTINON) 180 MG CR tablet Take 180 mg by mouth daily. 06/27/20   [provider]  traZODone (DESYREL) 100 MG tablet TAKE 1 TABLET BY MOUTH AT BEDTIME AS NEEDED FOR SLEEP Patient taking differently: Take 100 mg by mouth at bedtime as needed for sleep. 07/07/20   DurhAlycia Rossetti    Allergies    Patient has no known allergies.  Review of Systems   Review of Systems  All other systems reviewed and are negative.   Physical Exam Updated Vital Signs BP (!) 136/57   Pulse 97   Temp 98.8 F (37.1 C) (Oral)   Resp 17   Ht 1.638 m (5' 4.5")   Wt 113.1 kg   SpO2 99%   BMI 42.14 kg/m   Physical Exam Vitals and nursing note reviewed.  Constitutional:  General: She is not in acute distress.    Appearance: She is well-developed.  HENT:     Head: Normocephalic and atraumatic.     Mouth/Throat:     Pharynx: No  oropharyngeal exudate.  Eyes:     General: No scleral icterus.       Right eye: No discharge.        Left eye: No discharge.     Conjunctiva/sclera: Conjunctivae normal.     Pupils: Pupils are equal, round, and reactive to light.  Neck:     Thyroid: No thyromegaly.     Vascular: No JVD.  Cardiovascular:     Rate and Rhythm: Normal rate and regular rhythm.     Heart sounds: Normal heart sounds. No murmur heard. No friction rub. No gallop.   Pulmonary:     Effort: Pulmonary effort is normal. No respiratory distress.     Breath sounds: Normal breath sounds. No wheezing or rales.  Abdominal:     General: Bowel sounds are normal. There is no distension.     Palpations: Abdomen is soft. There is no mass.     Tenderness: There is no abdominal tenderness.  Musculoskeletal:        General: Tenderness present. Normal range of motion.     Cervical back: Normal range of motion and neck supple.     Right lower leg: Edema present.     Left lower leg: Edema present.     Comments: The patient is able to fully range both of her arms and both of her legs though she is generally weak in the legs.  She has fairly severe edema and chronic venous stasis of her bilateral lower extremities to the point where I cannot feel dorsalis pedis however she does have good capillary refill of the toes.  She has multiple bullous lesions to the right lower extremity at the ankle most of which have lysed, there is some purulent drainage coming from the anterior location over small wounds in the skin.  There does appear to be a fluid collection below the skin which is not necessarily palpated in the left leg, it does appear asymmetrical.  There is a foul smell coming from the orders  Lymphadenopathy:     Cervical: No cervical adenopathy.  Skin:    General: Skin is warm and dry.     Findings: No erythema or rash.  Neurological:     Mental Status: She is alert.     Coordination: Coordination normal.     Comments: Awake  and alert and able to follow commands, she is able to stand and pivot and get herself into the bed, speech is clear, coordination is normal  Psychiatric:        Behavior: Behavior normal.     ED Results / Procedures / Treatments   Labs (all labs ordered are listed, but only abnormal results are displayed) Labs Reviewed  COMPREHENSIVE METABOLIC PANEL - Abnormal; Notable for the following components:      Result Value   Glucose, Bld 109 (*)    Creatinine, Ser 1.35 (*)    Calcium 8.7 (*)    Albumin 3.0 (*)    AST 13 (*)    GFR, Estimated 42 (*)    All other components within normal limits  CBC WITH DIFFERENTIAL/PLATELET - Abnormal; Notable for the following components:   RBC 3.48 (*)    Hemoglobin 10.5 (*)    HCT 33.4 (*)    RDW 17.4 (*)  Monocytes Absolute 1.3 (*)    All other components within normal limits  BODY FLUID CULTURE W GRAM STAIN  RESP PANEL BY RT-PCR (FLU A&B, COVID) [id number]    EKG None  Radiology CT Ankle Right Wo Contrast  Result Date: 10/18/2020 CLINICAL DATA:  Right ankle pain and swelling EXAM: CT OF THE RIGHT ANKLE WITHOUT CONTRAST TECHNIQUE: Multidetector CT imaging of the right ankle was performed according to the standard protocol. Multiplanar CT image reconstructions were also generated. COMPARISON:  X-ray 07/11/2020 FINDINGS: Bones/Joint/Cartilage No acute fracture. No dislocation. Minimal degenerative dorsal spurring within the midfoot. Overall, joint spaces are preserved. Prominent bidirectional calcaneal enthesophytes. No tibiotalar or subtalar joint effusion. No bony erosion or periosteal elevation. Pes planus alignment. Ligaments Suboptimally assessed by CT. Muscles and Tendons Musculotendinous structures are grossly intact. No tenosynovial fluid collection is evident. Soft tissues Diffuse circumferential subcutaneous edema of the lower leg and foot. There is a large fluid collection within the anterior and medial superficial soft tissues of the lower  leg extending distally to the level of the distal tibial metaphysis. Collection measures approximately 8.6 x 6.0 cm trans-axially and extends at least 6.5 cm in craniocaudal dimension, although the full superior extent of the collection was not entirely included within the field of view. Internal density of the collection measures approximately 11 HU, suggestive of simple fluid. No gas is seen within the collection. IMPRESSION: 1. Large fluid collection within the anterior and medial superficial soft tissues of the right lower leg extending distally to the level of the distal tibial metaphysis, measuring at least 8.6 x 6.0 x 6.5 cm with simple fluid internal density. Appearance is nonspecific and could represent a evolving hematoma or seroma. Correlate for signs of infection to exclude possibility of an abscess. No gas within the collection. 2. Nonspecific circumferential subcutaneous edema of the lower leg and foot. 3. No acute osseous findings. Electronically Signed   By: Davina Poke D.O.   On: 10/18/2020 17:57    Procedures .Marland KitchenIncision and Drainage  Date/Time: 10/18/2020 6:52 PM Performed by: Noemi Chapel, MD Authorized by: Noemi Chapel, MD   Consent:    Consent obtained:  Verbal   Consent given by:  Patient   Risks, benefits, and alternatives were discussed: yes     Risks discussed:  Bleeding, damage to other organs, incomplete drainage, infection and pain   Alternatives discussed:  Delayed treatment Universal protocol:    Procedure explained and questions answered to patient or proxy's satisfaction: yes     Relevant documents present and verified: yes     Test results available : yes     Imaging studies available: yes     Required blood products, implants, devices, and special equipment available: yes     Site/side marked: yes     Immediately prior to procedure, a time out was called: yes     Patient identity confirmed:  Verbally with patient and arm band Location:    Type:   Abscess   Size:  Large   Location:  Lower extremity   Lower extremity location:  Leg   Leg location:  R lower leg Pre-procedure details:    Skin preparation:  Chlorhexidine Sedation:    Sedation type:  None Anesthesia:    Anesthesia method:  Local infiltration   Local anesthetic:  Lidocaine 1% WITH epi Procedure type:    Complexity:  Complex Procedure details:    Ultrasound guidance: no     Incision types:  Single straight   Incision depth:  Submucosal   Wound management:  Probed and deloculated, irrigated with saline and extensive cleaning   Drainage:  Purulent   Drainage amount:  Moderate   Wound treatment:  Wound left open   Packing material: 1 inch packing. Post-procedure details:    Procedure completion:  Tolerated well, no immediate complications Comments:           Medications Ordered in ED Medications  vancomycin (VANCOCIN) IVPB 1000 mg/200 mL premix (has no administration in time range)  doxycycline (VIBRA-TABS) tablet 100 mg (100 mg Oral Given 10/18/20 1802)  lidocaine-EPINEPHrine (XYLOCAINE W/EPI) 2 %-1:200000 (PF) injection 20 mL (5 mLs Other Given by Other 10/18/20 1827)    ED Course  I have reviewed the triage vital signs and the nursing notes.  Pertinent labs & imaging results that were available during my care of the patient were reviewed by me and considered in my medical decision making (see chart for details).  Clinical Course as of 10/18/20 3086  Nancy Fetter Oct 18, 2020  1800 I have personally viewed the CT scan of the ankle and the distal tibia, there does appear to be a large circumferential fluid collection in that area, there is no overt signs of abscess however given the lack of IV contrast we will need to obtain a fluid sample, the patient will be offered aspiration with culture of this fluid.  She has already been given a dose of doxycycline.  Her vital signs remain unremarkable, lab work is pending [BM]    Clinical Course User Index [BM] Noemi Chapel, MD   MDM Rules/Calculators/A&P                          When I compare pictures from the chart from her visit to the vascular surgery office on September 28, 2020 to today's visit this area appears unchanged in size and location, there is some foul-smelling ooze coming from the skin suggestive of a bacterial over infection.  Labs will be ordered, will need to make sure there is no signs of deep tissue abscess thus a CT scan will be ordered but this will be noncontrast given the patient's chronic kidney disease.  The patient is not appearing fluid overloaded or deprived otherwise just her chronic edema which appears to be in its baseline state.  She will have sterile dressings were applied to her legs   Thankfully the patient does not have a leukocytosis however the CT scan does reveal that she has a significantly sized fluid collection that is virtually circumferential of her lower extremity on the right.  An aspiration revealed easily 10 cc of foul-smelling purulent drainage suggestive that this was more abscess and not a seroma or other benign fluid collection.  After discussion with Dr. Aviva Signs of the general surgery service the decision was made to continue with incision and drainage at the bedside, Dr. Arnoldo Morale agrees that the patient should be admitted to the hospital on IV antibiotics and he will consult as well.  The incision and drainage went seamlessly, approximately 2 inch incision was made in the proximal distal orientation in the anterior lower extremity at the midpoint of the fluctuance of the fluid collection.  Approximately 400 cc of purulent material was expressed from the wound, this was irrigated deloculated and then packing was placed.  We will consult with hospitalist for admission  Final Clinical Impression(s) / ED Diagnoses Final diagnoses:  Abscess of right leg  Noemi Chapel, MD 10/18/20 [phone]

## 2020-10-18 NOTE — ED Notes (Signed)
Wet to dry dressings placed on pt's bilateral lower legs/feet per EDP verbal order.

## 2020-10-18 NOTE — ED Triage Notes (Signed)
Pt to the ED with Left Leg pain with a fluid filled sack around the left ankle.

## 2020-10-18 NOTE — H&P (Signed)
History and Physical    [name] BCA:[id number] DOB: 1950/10/26 DOA: 10/18/2020  PCP: Pcp, No (Confirm with patient/family/NH records and if not entered, this has to be entered at [facility] point of entry) Patient coming from: Home  I have personally briefly reviewed patient's old medical records in [facility]  Chief Complaint: abscess right leg  HPI: [name] is a 70 y.o. female with medical history significant of HTN, steroid related diabetes, myesthenia gravis, HLD, obesity with hypoventilation syndrome and OSA, She has had a problem with LE edema since December 2021. She has been seen in the ED 09/23/20, 09/24/20 for this problem and prescribed UNNA boot treatment which was intolerable due to pain. She has been seen by vascular surgery where U/S studies revealed right great saphenous reflux, left great saphenous, left sphenofemoral and left short saphenous reflux on 09/28/20. She was instructed to increase lasix to 40 mg bid and to continue [employer] treatment. On 10/01/20 she saw her Primary care provider and was found to have a creatinine of 4.23 thought to be prerenal. She was referred to hospital and admitted for AKI. She had a good response to IV hydration, cessation of diuretics leaving the hospital with a creatinine of 1.44.   At discharge she resumed metformin, continued her home medication regimen and for wound care was to use xeroform covered with  ABD covered with dry dressing and ACE. Her grand daughter was changing her dressing today and noted purulent drainage from burst bullae on the right LE leading to presentation to [facility].     ED Course: 98.8  136/57  HR 97  RR 17 BMI 42. CT right LE revealed a circumferential fluid collection distal right LE measuring 8.6x6.0x6.5 cm. Labs revealed stable CKD with Cr 1.35, nl CBCD. Dr. Lovell Sheehan for GS was consult who recommended I&D of abcess which was performed by EDMD Dr. Hyacinth Meeker with the release of 300-400 cc purulent fluid. TRH is  called to admit the patient for IV abx with GS follow up in the AM in regard to any further surgical treatment.   Review of Systems: As per HPI otherwise 10 point review of systems negative.    Past Medical History:  Diagnosis Date  . Achilles tendinitis   . Anxiety   . Asthma   . Depression   . GERD (gastroesophageal reflux disease)   . Gout   . H/O myasthenia gravis    left eye  . HTN (hypertension)   . Hyperlipidemia   . Low back pain   . Obesity hypoventilation syndrome (HCC)   . Obstructive sleep apnea   . Osteoarthritis     Past Surgical History:  Procedure Laterality Date  . CATARACT EXTRACTION W/PHACO Left 09/17/2015   Procedure: CATARACT EXTRACTION PHACO AND INTRAOCULAR LENS PLACEMENT LEFT EYE cde=8.58;  Surgeon: Gemma Payor, MD;  Location: [facility];  Service: Ophthalmology;  Laterality: Left;  . CATARACT EXTRACTION W/PHACO Right 05/15/2017   Procedure: CATARACT EXTRACTION PHACO AND INTRAOCULAR LENS PLACEMENT (IOC);  Surgeon: Gemma Payor, MD;  Location: [facility];  Service: Ophthalmology;  Laterality: Right;  CDE: 6.34  . COLONOSCOPY N/A 12/25/2013   Procedure: COLONOSCOPY;  Surgeon: Malissa Hippo, MD;  Location: [facility];  Service: Endoscopy;  Laterality: N/A;  730-rescheduled Ann notified pt  . COLONOSCOPY WITH PROPOFOL N/A 10/04/2019   Procedure: COLONOSCOPY WITH PROPOFOL;  Surgeon: Malissa Hippo, MD;  Location: [facility];  Service: Endoscopy;  Laterality: N/A;  815  . CYST  EXCISION N/A 09/12/2016   Procedure: EXCISION SEBACEOUS CYST, BACK;  Surgeon: Franky Macho, MD;  Location: [facility];  Service: General;  Laterality: N/A;  . INTRAOCULAR LENS INSERTION Bilateral 2018   Dr. Gemma Payor   . POLYPECTOMY  10/04/2019   Procedure: POLYPECTOMY;  Surgeon: Malissa Hippo, MD;  Location: [facility];  Service: Endoscopy;;  . TUBAL LIGATION     Soc Hx - widowed in 2016 after 24 years of marriage. She has two sons, one daughter, 6 grands and 3 great-grand children. She  lives alone with family looking in on her. She is retired from assembly work in mfg.    reports that she quit smoking about 15 years ago. Her smoking use included cigarettes. She started smoking about 51 years ago. She has a 72.00 pack-year smoking history. She has never used smokeless tobacco. She reports that she does not drink alcohol and does not use drugs.  No Known Allergies  Family History  Problem Relation Age of Onset  . Heart disease Mother   . Thyroid disease Mother   . Heart failure Father   . Lung disease Father   . Diabetes Brother   . Crohn's disease Daughter      Prior to Admission medications   Medication Sig Start Date End Date Taking? Authorizing Provider  acetaminophen (TYLENOL) 500 MG tablet Take 1,000 mg by mouth every 6 (six) hours as needed for mild pain.    Yes [provider]  albuterol (VENTOLIN HFA) 108 (90 Base) MCG/ACT inhaler INHALE 2 PUFFS INTO THE LUNGS EVERY 4 HOURS AS NEEDED FOR WHEEZING OR SHORTNESS OF BREATH Patient taking differently: Inhale 2 puffs into the lungs every 4 (four) hours as needed for wheezing or shortness of breath. 01/23/20  Yes [place], Velna Hatchet, MD  allopurinol (ZYLOPRIM) 100 MG tablet TAKE 1 TABLET(100 MG) BY MOUTH DAILY Patient taking differently: Take 100 mg by mouth daily. 02/28/20  Yes [place], Velna Hatchet, MD  aspirin EC 81 MG tablet Take 1 tablet (81 mg total) by mouth every evening. 10/05/19  Yes Rehman, Joline Maxcy, MD  atorvastatin (LIPITOR) 40 MG tablet TAKE 1 TABLET(40 MG) BY MOUTH DAILY AT 6 PM Patient taking differently: Take 40 mg by mouth daily. 08/31/20  Yes [place], Velna Hatchet, MD  Blood Glucose Monitoring Suppl (BLOOD GLUCOSE SYSTEM PAK) KIT Use as directed to monitor FSBS 3x weekly. Dx: R73.09. 05/25/20  Yes [place], Velna Hatchet, MD  busPIRone (BUSPAR) 5 MG tablet TAKE 1 TABLET(5 MG) BY MOUTH TWICE DAILY FOR ANXIETY Patient taking differently: Take 5 mg by mouth 2 (two) times daily. 10/16/20  Yes [place], Velna Hatchet, MD   clotrimazole (LOTRIMIN) 1 % cream Apply 1 application topically 2 (two) times daily. 01/08/20  Yes [place], Velna Hatchet, MD  diphenhydrAMINE (BENADRYL) 25 MG tablet Take 25 mg by mouth daily.   Yes [provider]  fluticasone (CUTIVATE) 0.05 % cream APPLY EXTERNALLY TO THE AFFECTED AREA DAILY 08/13/18  Yes [place], Velna Hatchet, MD  furosemide (LASIX) 40 MG tablet TAKE 1 TABLET(40 MG) BY MOUTH DAILY Patient taking differently: Take 40 mg by mouth daily. 09/08/20  Yes [place], Velna Hatchet, MD  Glucose Blood (BLOOD GLUCOSE TEST STRIPS) STRP Use as directed to monitor FSBS 3x weekly. Dx: R73.09. 05/25/20  Yes [place], Velna Hatchet, MD  HYDROcodone-acetaminophen (NORCO) 7.5-325 MG tablet Take 1 tablet by mouth 2 (two) times daily as needed for moderate pain. 09/30/20  Yes [place], Velna Hatchet, MD  Lancets MISC Use as  directed to monitor FSBS 3x weekly. Dx: R73.09. 05/25/20  Yes [place], Modena Nunnery, MD  Magnesium 250 MG TABS Take 1 tablet (250 mg total) by mouth daily. Patient taking differently: Take 250 mg by mouth daily in the afternoon. Midday 08/16/18  Yes [place], Modena Nunnery, MD  metFORMIN (GLUCOPHAGE) 500 MG tablet Take 1 tablet (500 mg total) by mouth 2 (two) times daily with a meal. 08/12/20  Yes [place], Modena Nunnery, MD  nystatin (MYCOSTATIN/NYSTOP) powder Apply 1 application topically 3 (three) times daily. 01/08/20  Yes [place], Modena Nunnery, MD  omeprazole (PRILOSEC) 40 MG capsule TAKE 1 CAPSULE(40 MG) BY MOUTH DAILY Patient taking differently: Take 40 mg by mouth daily. 06/29/20  Yes [place], Modena Nunnery, MD  OXYGEN Inhale 1 L into the lungs at bedtime. IN ADDITION TO CPAP NIGHTLY   Yes [provider]  potassium chloride (KLOR-CON) 10 MEQ tablet TAKE 1 TABLET BY MOUTH DAILY WITH FLUID PILL Patient taking differently: Take 10 mEq by mouth daily. With fluid pill 09/01/20  Yes [place], Modena Nunnery, MD  predniSONE (DELTASONE) 10 MG tablet Pt currently on 24m daily, start taking 598mdaily for one month, then decrease  to 4068maily for 1 month, then decrease 72m23mily. Patient taking differently: Take 30 mg by mouth daily. 07/16/20  Yes [place], RichNanine Means  pyridostigmine (MESTINON) 180 MG CR tablet Take 180 mg by mouth daily. 06/27/20  Yes [provider]  traZODone (DESYREL) 100 MG tablet TAKE 1 TABLET BY MOUTH AT BEDTIME AS NEEDED FOR SLEEP Patient taking differently: Take 100 mg by mouth at bedtime as needed for sleep. 07/07/20  Yes DurhAlycia Rossetti    Physical Exam: Vitals:   10/18/20 1652 10/18/20 1700  BP:  (!) 136/57  Pulse:  97  Resp:  17  Temp:  98.8 F (37.1 C)  TempSrc:  Oral  SpO2:  99%  Weight: 113.1 kg   Height: 5' 4.5" (1.638 m)      Vitals:   10/18/20 1652 10/18/20 1700  BP:  (!) 136/57  Pulse:  97  Resp:  17  Temp:  98.8 F (37.1 C)  TempSrc:  Oral  SpO2:  99%  Weight: 113.1 kg   Height: 5' 4.5" (1.638 m)    General:   Overweight woman in no acute distress. Eyes: PERRL, lids and conjunctivae normal ENMT: Mucous membranes are moist. Normal dentition but missing several teeth.  Neck: normal, supple, no masses, no thyromegaly Respiratory: clear to auscultation bilaterally, no wheezing, no crackles. Normal respiratory effort. No accessory muscle use.  Cardiovascular: Regular rate and rhythm, no murmurs / rubs / gallops. 3+ LE extremity edema. trace+ pedal pulses. No carotid bruits.  Abdomen: obese, no tenderness, no masses palpated. No hepatosplenomegaly. Bowel sounds positive.  Musculoskeletal: no clubbing / cyanosis. No joint deformity upper and lower extremities. Good ROM, no contractures. Normal muscle tone. No tophi Skin: Venous stasis ulcers both distal LE. 2 cm incision site distal right LE. Pt reports h/o yeast infections in intertriginous regions, below panniculus and groins, which were ot examined. Neurologic: CN 2-12 grossly intact. Sensation intact.  Strength 5/5 in all 4.  Psychiatric: Normal judgment and insight. Alert and oriented x 3. Normal  mood.     Labs on Admission: I have personally reviewed following labs and imaging studies  CBC: Recent Labs  Lab 10/18/20 1739  WBC 10.2  NEUTROABS 6.1  HGB 10.5*  HCT 33.4*  MCV 96.0  PLT 313 245asic Metabolic  Panel: Recent Labs  Lab 10/18/20 1739  NA 136  K 4.8  CL 102  CO2 24  GLUCOSE 109*  BUN 22  CREATININE 1.35*  CALCIUM 8.7*   GFR: Estimated Creatinine Clearance: 47.5 mL/min (A) (by C-G formula based on SCr of 1.35 mg/dL (H)). Liver Function Tests: Recent Labs  Lab 10/18/20 1739  AST 13*  ALT 12  ALKPHOS 100  BILITOT 0.5  PROT 6.6  ALBUMIN 3.0*   No results for input(s): LIPASE, AMYLASE in the last 168 hours. No results for input(s): AMMONIA in the last 168 hours. Coagulation Profile: No results for input(s): INR, PROTIME in the last 168 hours. Cardiac Enzymes: No results for input(s): CKTOTAL, CKMB, CKMBINDEX, TROPONINI in the last 168 hours. BNP (last 3 results) No results for input(s): PROBNP in the last 8760 hours. HbA1C: No results for input(s): HGBA1C in the last 72 hours. CBG: No results for input(s): GLUCAP in the last 168 hours. Lipid Profile: No results for input(s): CHOL, HDL, LDLCALC, TRIG, CHOLHDL, LDLDIRECT in the last 72 hours. Thyroid Function Tests: No results for input(s): TSH, T4TOTAL, FREET4, T3FREE, THYROIDAB in the last 72 hours. Anemia Panel: No results for input(s): VITAMINB12, FOLATE, FERRITIN, TIBC, IRON, RETICCTPCT in the last 72 hours. Urine analysis:    Component Value Date/Time   COLORURINE YELLOW 10/03/2020 1646   APPEARANCEUR HAZY (A) 10/03/2020 1646   LABSPEC 1.010 10/03/2020 1646   PHURINE 5.0 10/03/2020 1646   GLUCOSEU NEGATIVE 10/03/2020 1646   HGBUR NEGATIVE 10/03/2020 1646   BILIRUBINUR NEGATIVE 10/03/2020 1646   KETONESUR NEGATIVE 10/03/2020 1646   PROTEINUR NEGATIVE 10/03/2020 1646   NITRITE NEGATIVE 10/03/2020 1646   LEUKOCYTESUR NEGATIVE 10/03/2020 1646    Radiological Exams on  Admission: CT Ankle Right Wo Contrast  Result Date: 10/18/2020 CLINICAL DATA:  Right ankle pain and swelling EXAM: CT OF THE RIGHT ANKLE WITHOUT CONTRAST TECHNIQUE: Multidetector CT imaging of the right ankle was performed according to the standard protocol. Multiplanar CT image reconstructions were also generated. COMPARISON:  X-ray 07/11/2020 FINDINGS: Bones/Joint/Cartilage No acute fracture. No dislocation. Minimal degenerative dorsal spurring within the midfoot. Overall, joint spaces are preserved. Prominent bidirectional calcaneal enthesophytes. No tibiotalar or subtalar joint effusion. No bony erosion or periosteal elevation. Pes planus alignment. Ligaments Suboptimally assessed by CT. Muscles and Tendons Musculotendinous structures are grossly intact. No tenosynovial fluid collection is evident. Soft tissues Diffuse circumferential subcutaneous edema of the lower leg and foot. There is a large fluid collection within the anterior and medial superficial soft tissues of the lower leg extending distally to the level of the distal tibial metaphysis. Collection measures approximately 8.6 x 6.0 cm trans-axially and extends at least 6.5 cm in craniocaudal dimension, although the full superior extent of the collection was not entirely included within the field of view. Internal density of the collection measures approximately 11 HU, suggestive of simple fluid. No gas is seen within the collection. IMPRESSION: 1. Large fluid collection within the anterior and medial superficial soft tissues of the right lower leg extending distally to the level of the distal tibial metaphysis, measuring at least 8.6 x 6.0 x 6.5 cm with simple fluid internal density. Appearance is nonspecific and could represent a evolving hematoma or seroma. Correlate for signs of infection to exclude possibility of an abscess. No gas within the collection. 2. Nonspecific circumferential subcutaneous edema of the lower leg and foot. 3. No acute  osseous findings. Electronically Signed   By: Davina Poke D.O.   On: 10/18/2020 17:57  EKG: Independently reviewed. No EKG done in ED  Assessment/Plan Active Problems:   Edema   Abscess of right lower extremity excluding foot   Essential hypertension   Class 3 obesity   Depression   Ocular myasthenia gravis (HCC)   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HCC)   Chronic kidney disease   Hyperlipidemia   Obesity hypoventilation syndrome (HCC)   GERD   Gout   Asthma   Abscess of right leg excluding foot    1. Abscess right leg excluding foot - I&D performed in ED. No fever, no leukocytosis but immunocompromised patient 2/2 steroid induced diabetes. Plan Abx coverage - Vancomycin + Zosyn for 24-48 hours  Surgical consult follow up in AM in regard to any need for further debridement or drainage  Wound care consult for best dressing protocol  2. HTN- patient on diuretic and was on ARB which was stopped at last hospitalization. Currently BP appears stable. Plan Continue Lasix  For rise in BP recommend BB or CCB  3. Ocular Myesthenia gravis - continue home regimen  4. Steroid induced DM - last A1C 09/16/20 8.7%.  Plan Hold metformin in setting of infection  Basal insulin - lantus 10u qHS  Ss  5. CKD - stable creatinine at admission Plan F/u Bmet  6. Gout - continue home regimen of allopurinol  7. HLD - continue home regimen  DVT prophylaxis: heparin SQ  Code Status: full code  Family Communication: spoke with daughter, [name], explaining Dx and Tx plan.  Disposition Plan: home when medically stable  Consults called: GS - Dr Arnoldo Morale  Admission status: inpatient    Adella Hare MD [facility] Pager [phone]  If 7PM-7AM, please contact night-coverage [email or web address] Password [facility]  10/18/2020, 7:46 PM

## 2020-10-18 NOTE — ED Notes (Signed)
Multiple attempts made by multiple RNs to obtain IV access.

## 2020-10-18 NOTE — Progress Notes (Signed)
Pharmacy Antibiotic Note  [name] is a 70 y.o. female admitted on 10/18/2020 with wound infection.  Pharmacy has been consulted for Vancomycin dosing.  Plan: Vancomycin 2000 mg IV x 1 dose. Vancomycin 1250 mg IV every 24 hours. Monitor labs, c/s, and vanco level as indicated.  Height: 5' 4.5" (163.8 cm) Weight: 113.1 kg (249 lb 5.4 oz) IBW/kg (Calculated) : 55.85  Temp (24hrs), Avg:98.8 F (37.1 C), Min:98.8 F (37.1 C), Max:98.8 F (37.1 C)  Recent Labs  Lab 10/18/20 1739  WBC 10.2  CREATININE 1.35*    Estimated Creatinine Clearance: 47.5 mL/min (A) (by C-G formula based on SCr of 1.35 mg/dL (H)).    No Known Allergies  Antimicrobials this admission: Vanco 3/27 >>  Zosyn 3/27 >>    Microbiology results: 3/27 Abscess Cx: pending  Thank you for allowing pharmacy to be a part of this patient's care.  Ramond Craver 10/18/2020 8:27 PM

## 2020-10-19 ENCOUNTER — Ambulatory Visit (HOSPITAL_COMMUNITY): Payer: Medicare Other | Attending: Physical Therapy | Admitting: Physical Therapy

## 2020-10-19 DIAGNOSIS — L02415 Cutaneous abscess of right lower limb: Principal | ICD-10-CM

## 2020-10-19 LAB — COMPREHENSIVE METABOLIC PANEL
ALT: 9 U/L (ref 0–44)
AST: 13 U/L — ABNORMAL LOW (ref 15–41)
Albumin: 2.6 g/dL — ABNORMAL LOW (ref 3.5–5.0)
Alkaline Phosphatase: 87 U/L (ref 38–126)
Anion gap: 10 (ref 5–15)
BUN: 23 mg/dL (ref 8–23)
CO2: 24 mmol/L (ref 22–32)
Calcium: 8.5 mg/dL — ABNORMAL LOW (ref 8.9–10.3)
Chloride: 103 mmol/L (ref 98–111)
Creatinine, Ser: 1.35 mg/dL — ABNORMAL HIGH (ref 0.44–1.00)
GFR, Estimated: 42 mL/min — ABNORMAL LOW (ref >60)
Glucose, Bld: 84 mg/dL (ref 70–99)
Potassium: 4.7 mmol/L (ref 3.5–5.1)
Sodium: 137 mmol/L (ref 135–145)
Total Bilirubin: 0.9 mg/dL (ref 0.3–1.2)
Total Protein: 5.7 g/dL — ABNORMAL LOW (ref 6.5–8.1)

## 2020-10-19 LAB — MRSA PCR SCREENING: MRSA by PCR: NEGATIVE

## 2020-10-19 LAB — CBC
HCT: 31.7 % — ABNORMAL LOW (ref 36.0–46.0)
Hemoglobin: 9.6 g/dL — ABNORMAL LOW (ref 12.0–15.0)
MCH: 29.3 pg (ref 26.0–34.0)
MCHC: 30.3 g/dL (ref 30.0–36.0)
MCV: 96.6 fL (ref 80.0–100.0)
Platelets: 284 10*3/uL (ref 150–400)
RBC: 3.28 MIL/uL — ABNORMAL LOW (ref 3.87–5.11)
RDW: 17.3 % — ABNORMAL HIGH (ref 11.5–15.5)
WBC: 8 10*3/uL (ref 4.0–10.5)
nRBC: 0 % (ref 0.0–0.2)

## 2020-10-19 LAB — GLUCOSE, CAPILLARY
Glucose-Capillary: 83 mg/dL (ref 70–99)
Glucose-Capillary: 126 mg/dL — ABNORMAL HIGH (ref 70–99)
Glucose-Capillary: 115 mg/dL — ABNORMAL HIGH (ref 70–99)
Glucose-Capillary: 174 mg/dL — ABNORMAL HIGH (ref 70–99)

## 2020-10-19 MED ORDER — BACITRACIN-NEOMYCIN-POLYMYXIN 400-5-5000 EX OINT
TOPICAL_OINTMENT | Freq: Two times a day (BID) | CUTANEOUS | Status: DC
  Administered 2020-10-19 – 2020-10-20 (×3): 1 via TOPICAL
  Filled 2020-10-19 (×2): qty 1

## 2020-10-19 MED ORDER — PYRIDOSTIGMINE BROMIDE 60 MG PO TABS
60.0000 mg | ORAL_TABLET | Freq: Three times a day (TID) | ORAL | Status: DC
  Administered 2020-10-19 – 2020-10-20 (×5): 60 mg via ORAL
  Filled 2020-10-19 (×5): qty 1

## 2020-10-19 NOTE — Chronic Care Management (AMB) (Signed)
       Patient is currently inpatient .   [facility]

## 2020-10-19 NOTE — Progress Notes (Signed)
PROGRESS NOTE    [name]  FYB:[id number] DOB: [birth year] DOA: 10/18/2020 PCP: Pcp, No   Brief Narrative:   [name] is a 70 y.o. female with medical history significant of HTN, steroid related diabetes, myesthenia gravis, HLD, obesity with hypoventilation syndrome and OSA, She has had a problem with LE edema since December 2021. Her grand daughter was changing her dressing today and noted purulent drainage from burst bullae on the right LE leading to presentation to [facility].  Patient was admitted with abscess to the right leg that underwent I&D in the ED.  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[place])   Chronic kidney disease   Abscess of right lower extremity excluding foot   Abscess of right leg   Right lower extremity abscess status post I&D 3/27 -Gram-negative rods noted in exudate with further ID and sensitivity pending -Continue on IV vancomycin and Zosyn, check MRSA pcr and if negative, DC Vancomycin -Wound care ongoing -Appreciate general surgery recommendations -Keep leg elevated  Hypertension -Currently stable -Continue on Lasix  Acute on myasthenia gravis -Continue home prednisone  Steroid-induced diabetes -Blood glucose currently well controlled -Holding home Metformin -SSI and basal insulin with Lantus 10 units nightly -Last hemoglobin A1c 8.7% on 09/16/2020  CKD stage IIIb -Appears to have baseline creatinine near 1.3-1.4 -Continue to monitor  Chronic gout -Continue home regimen of allopurinol  Dyslipidemia -Continue atorvastatin  Morbid obesity -Lifestyle changes in outpatient setting  DVT prophylaxis:Heparin Code Status: Full Family Communication: Discussed with daughter on phone 3/28 Disposition Plan:  Status  is: Inpatient  Remains inpatient appropriate because:IV treatments appropriate due to intensity of illness or inability to take PO and Inpatient level of care appropriate due to severity of illness   Dispo: The patient is from: Home              Anticipated d/c is to: Home              Patient currently is not medically stable to d/c.   Difficult to place patient No   Consultants:   General surgery  Procedures:   I&D of the right lower extremity abscess on 3/27  See below  Antimicrobials:  Anti-infectives (From admission, onward)   Start     Dose/Rate Route Frequency Ordered Stop   10/19/20 2200  vancomycin (VANCOREADY) IVPB 1250 mg/250 mL        1,250 mg 166.7 mL/hr over 90 Minutes Intravenous Every 24 hours 10/18/20 2026     10/18/20 2200  piperacillin-tazobactam (ZOSYN) IVPB 3.375 g        3.375 g 12.5 mL/hr over 240 Minutes Intravenous Every 8 hours 10/18/20 1932     10/18/20 2030  vancomycin (VANCOREADY) IVPB 2000 mg/400 mL        2,000 mg 200 mL/hr over 120 Minutes Intravenous  Once 10/18/20 2019 10/18/20 2323   10/18/20 1945  doxycycline (VIBRA-TABS) tablet 100 mg        100 mg Oral  Once 10/18/20 1930 10/18/20 2216   10/18/20 1900  vancomycin (VANCOCIN) IVPB 1000 mg/200 mL premix  Status:  Discontinued        1,000 mg 200 mL/hr over 60 Minutes Intravenous  Once 10/18/20 1851 10/18/20 2019  10/18/20 1715  doxycycline (VIBRA-TABS) tablet 100 mg        100 mg Oral  Once 10/18/20 1713 10/18/20 1802       Subjective: Patient seen and evaluated today with no new acute complaints or concerns. No acute concerns or events noted overnight.  Objective: Vitals:   10/18/20 2152 10/18/20 2317 10/19/20 0211 10/19/20 0604  BP: (!) 143/58  (!) 112/46 (!) 149/53  Pulse: 91 88 83 89  Resp:  18    Temp: 98.2 F (36.8 C)  97.8 F (36.6 C) 97.9 F (36.6 C)  TempSrc: Oral  Oral Oral  SpO2: 100% 95% 100% 100%  Weight:    115.1 kg  Height:        Intake/Output Summary  (Last 24 hours) at 10/19/2020 6433 Last data filed at 10/19/2020 0500 Gross per 24 hour  Intake 50 ml  Output --  Net 50 ml   Filed Weights   10/18/20 1652 10/19/20 0604  Weight: 113.1 kg 115.1 kg    Examination:  General exam: Appears calm and comfortable, morbid obesity Respiratory system: Clear to auscultation. Respiratory effort normal. Cardiovascular system: S1 & S2 heard, RRR.  Gastrointestinal system: Abdomen is soft Central nervous system: Alert and awake Extremities: Right extremities in bandages with some serosanguineous drainage noted Skin: No significant lesions noted Psychiatry: Flat affect.    Data Reviewed: I have personally reviewed following labs and imaging studies  CBC: Recent Labs  Lab 10/18/20 1739 10/19/20 0425  WBC 10.2 8.0  NEUTROABS 6.1  --   HGB 10.5* 9.6*  HCT 33.4* 31.7*  MCV 96.0 96.6  PLT 313 295   Basic Metabolic Panel: Recent Labs  Lab 10/18/20 1739 10/19/20 0425  NA 136 137  K 4.8 4.7  CL 102 103  CO2 24 24  GLUCOSE 109* 84  BUN 22 23  CREATININE 1.35* 1.35*  CALCIUM 8.7* 8.5*   GFR: Estimated Creatinine Clearance: 48 mL/min (A) (by C-G formula based on SCr of 1.35 mg/dL (H)). Liver Function Tests: Recent Labs  Lab 10/18/20 1739 10/19/20 0425  AST 13* 13*  ALT 12 9  ALKPHOS 100 87  BILITOT 0.5 0.9  PROT 6.6 5.7*  ALBUMIN 3.0* 2.6*   No results for input(s): LIPASE, AMYLASE in the last 168 hours. No results for input(s): AMMONIA in the last 168 hours. Coagulation Profile: No results for input(s): INR, PROTIME in the last 168 hours. Cardiac Enzymes: No results for input(s): CKTOTAL, CKMB, CKMBINDEX, TROPONINI in the last 168 hours. BNP (last 3 results) No results for input(s): PROBNP in the last 8760 hours. HbA1C: No results for input(s): HGBA1C in the last 72 hours. CBG: Recent Labs  Lab 10/18/20 2228 10/19/20 0746  GLUCAP 83 83   Lipid Profile: No results for input(s): CHOL, HDL, LDLCALC, TRIG,  CHOLHDL, LDLDIRECT in the last 72 hours. Thyroid Function Tests: No results for input(s): TSH, T4TOTAL, FREET4, T3FREE, THYROIDAB in the last 72 hours. Anemia Panel: No results for input(s): VITAMINB12, FOLATE, FERRITIN, TIBC, IRON, RETICCTPCT in the last 72 hours. Sepsis Labs: No results for input(s): PROCALCITON, LATICACIDVEN in the last 168 hours.  Recent Results (from the past 240 hour(s))  Body fluid culture w Gram Stain     Status: None (Preliminary result)   Collection Time: 10/18/20  6:30 PM   Specimen: Wound; Body Fluid  Result Value Ref Range Status   Specimen Description   Final    WOUND ABCESS Performed at [facility], [address], [place],  [place] [address]    Special Requests   Final    Normal Performed at [place] [place], [place] [address]    Gram Stain   Final    MODERATE WBC PRESENT, PREDOMINANTLY PMN MODERATE GRAM NEGATIVE RODS    Culture   Final    CULTURE REINCUBATED FOR BETTER GROWTH Performed at [facility], [place] [address], [place], [place] [address]    Report Status PENDING  Incomplete  Resp Panel by RT-PCR (Flu A&B, Covid) Nasopharyngeal Swab     Status: None   Collection Time: 10/18/20  7:00 PM   Specimen: Nasopharyngeal Swab; Nasopharyngeal(NP) swabs in vial transport medium  Result Value Ref Range Status   SARS Coronavirus 2 by RT PCR NEGATIVE NEGATIVE Final    Comment: (NOTE) SARS-CoV-2 target nucleic acids are NOT DETECTED.  The SARS-CoV-2 RNA is generally detectable in upper respiratory specimens during the acute phase of infection. The lowest concentration of SARS-CoV-2 viral copies this assay can detect is 138 copies/mL. A negative result does not preclude SARS-Cov-2 infection and should not be used as the sole basis for treatment or other patient management decisions. A negative result may occur with  improper specimen collection/handling, submission of specimen other than nasopharyngeal swab, presence of  viral mutation(s) within the areas targeted by this assay, and inadequate number of viral copies(<138 copies/mL). A negative result must be combined with clinical observations, patient history, and epidemiological information. The expected result is Negative.  Fact Sheet for Patients:  EntrepreneurPulse.com.au  Fact Sheet for Healthcare Providers:  IncredibleEmployment.be  This test is no t yet approved or cleared by the [place] FDA and  has been authorized for detection and/or diagnosis of SARS-CoV-2 by FDA under an Emergency Use Authorization (EUA). This EUA will remain  in effect (meaning this test can be used) for the duration of the COVID-19 declaration under Section 564(b)(1) of the Act, 21 U.S.C.section 360bbb-3(b)(1), unless the authorization is terminated  or revoked sooner.       Influenza A by PCR NEGATIVE NEGATIVE Final   Influenza B by PCR NEGATIVE NEGATIVE Final    Comment: (NOTE) The Xpert Xpress SARS-CoV-2/FLU/RSV plus assay is intended as an aid in the diagnosis of influenza from Nasopharyngeal swab specimens and should not be used as a sole basis for treatment. Nasal washings and aspirates are unacceptable for Xpert Xpress SARS-CoV-2/FLU/RSV testing.  Fact Sheet for Patients: EntrepreneurPulse.com.au  Fact Sheet for Healthcare Providers: IncredibleEmployment.be  This test is not yet approved or cleared by the [place] FDA and has been authorized for detection and/or diagnosis of SARS-CoV-2 by FDA under an Emergency Use Authorization (EUA). This EUA will remain in effect (meaning this test can be used) for the duration of the COVID-19 declaration under Section 564(b)(1) of the Act, 21 U.S.C. section 360bbb-3(b)(1), unless the authorization is terminated or revoked.  Performed at [facility], [address] [place], [place] [address]          Radiology Studies: CT  Ankle Right Wo Contrast  Result Date: 10/18/2020 CLINICAL DATA:  Right ankle pain and swelling EXAM: CT OF THE RIGHT ANKLE WITHOUT CONTRAST TECHNIQUE: Multidetector CT imaging of the right ankle was performed according to the standard protocol. Multiplanar CT image reconstructions were also generated. COMPARISON:  X-ray 07/11/2020 FINDINGS: Bones/Joint/Cartilage No acute fracture. No dislocation. Minimal degenerative dorsal spurring within the midfoot. Overall, joint spaces are preserved. Prominent bidirectional calcaneal enthesophytes. No tibiotalar or subtalar joint effusion. No bony erosion or  periosteal elevation. Pes planus alignment. Ligaments Suboptimally assessed by CT. Muscles and Tendons Musculotendinous structures are grossly intact. No tenosynovial fluid collection is evident. Soft tissues Diffuse circumferential subcutaneous edema of the lower leg and foot. There is a large fluid collection within the anterior and medial superficial soft tissues of the lower leg extending distally to the level of the distal tibial metaphysis. Collection measures approximately 8.6 x 6.0 cm trans-axially and extends at least 6.5 cm in craniocaudal dimension, although the full superior extent of the collection was not entirely included within the field of view. Internal density of the collection measures approximately 11 HU, suggestive of simple fluid. No gas is seen within the collection. IMPRESSION: 1. Large fluid collection within the anterior and medial superficial soft tissues of the right lower leg extending distally to the level of the distal tibial metaphysis, measuring at least 8.6 x 6.0 x 6.5 cm with simple fluid internal density. Appearance is nonspecific and could represent a evolving hematoma or seroma. Correlate for signs of infection to exclude possibility of an abscess. No gas within the collection. 2. Nonspecific circumferential subcutaneous edema of the lower leg and foot. 3. No acute osseous findings.  Electronically Signed   By: Davina Poke D.O.   On: 10/18/2020 17:57        Scheduled Meds: . allopurinol  100 mg Oral Daily  . aspirin EC  81 mg Oral QPM  . atorvastatin  40 mg Oral Daily  . busPIRone  5 mg Oral BID  . clotrimazole  1 application Topical BID  . diphenhydrAMINE  25 mg Oral Daily  . furosemide  40 mg Oral Daily  . heparin  5,000 Units Subcutaneous Q8H  . insulin aspart  0-20 Units Subcutaneous TID WC  . insulin glargine  10 Units Subcutaneous QHS  . magnesium oxide  200 mg Oral Q1500  . nystatin  1 application Topical TID  . pantoprazole  80 mg Oral Daily  . potassium chloride  10 mEq Oral Daily  . predniSONE  30 mg Oral Daily  . pyridostigmine  60 mg Oral Q8H  . sodium chloride flush  3 mL Intravenous Q12H   Continuous Infusions: . sodium chloride    . piperacillin-tazobactam 3.375 g (10/19/20 0540)  . vancomycin       LOS: 1 day    Time spent: 35 minutes    Antion Andres Darleen Crocker, DO [facility]  If 7PM-7AM, please contact night-coverage [email or web address] 10/19/2020, 9:52 AM

## 2020-10-19 NOTE — Consult Note (Signed)
Reason for Consult: Cellulitis with abscess, right lower extremity Referring Physician: Dr. Kirby Funk is an 70 y.o. female.  HPI: Patient is a 70 year old black female with multiple medical problems who recently was seen by vascular surgery in [facility] for bilateral lower leg swelling.  She was noted to have some venous insufficiency and was felt that [place] boots/compression stockings may help with the edema.  She was noted to have superficial skin loss in the right lower extremity.  She presented to the emergency room yesterday for evaluation and treatment of an abscess of the right lower extremity.  An I&D was performed by the emergency room physician and the fluid was cultured and sent for further analysis.  She was admitted to the hospital for IV antibiotics.  This morning, she states she has no significant pain in the right lower extremity when lying down.  She denies any fevers.  Past Medical History:  Diagnosis Date  . Achilles tendinitis   . Anxiety   . Asthma   . Depression   . GERD (gastroesophageal reflux disease)   . Gout   . H/O myasthenia gravis    left eye  . HTN (hypertension)   . Hyperlipidemia   . Low back pain   . Obesity hypoventilation syndrome ([place])   . Obstructive sleep apnea   . Osteoarthritis     Past Surgical History:  Procedure Laterality Date  . CATARACT EXTRACTION W/PHACO Left 09/17/2015   Procedure: CATARACT EXTRACTION PHACO AND INTRAOCULAR LENS PLACEMENT LEFT EYE cde=8.58;  Surgeon: Tonny Branch, MD;  Location: [facility];  Service: Ophthalmology;  Laterality: Left;  . CATARACT EXTRACTION W/PHACO Right 05/15/2017   Procedure: CATARACT EXTRACTION PHACO AND INTRAOCULAR LENS PLACEMENT (IOC);  Surgeon: Tonny Branch, MD;  Location: [facility];  Service: Ophthalmology;  Laterality: Right;  CDE: 6.34  . COLONOSCOPY N/A 12/25/2013   Procedure: COLONOSCOPY;  Surgeon: Rogene Houston, MD;  Location: [facility];  Service: Endoscopy;  Laterality: N/A;   730-rescheduled Ann notified pt  . COLONOSCOPY WITH PROPOFOL N/A 10/04/2019   Procedure: COLONOSCOPY WITH PROPOFOL;  Surgeon: Rogene Houston, MD;  Location: [facility];  Service: Endoscopy;  Laterality: N/A;  815  . CYST EXCISION N/A 09/12/2016   Procedure: EXCISION SEBACEOUS CYST, BACK;  Surgeon: Aviva Signs, MD;  Location: [facility];  Service: General;  Laterality: N/A;  . INTRAOCULAR LENS INSERTION Bilateral 2018   Dr. Tonny Branch   . POLYPECTOMY  10/04/2019   Procedure: POLYPECTOMY;  Surgeon: Rogene Houston, MD;  Location: [facility];  Service: Endoscopy;;  . TUBAL LIGATION      Family History  Problem Relation Age of Onset  . Heart disease Mother   . Thyroid disease Mother   . Heart failure Father   . Lung disease Father   . Diabetes Brother   . Crohn's disease Daughter     Social History:  reports that she quit smoking about 15 years ago. Her smoking use included cigarettes. She started smoking about 51 years ago. She has a 72.00 pack-year smoking history. She has never used smokeless tobacco. She reports that she does not drink alcohol and does not use drugs.  Allergies: No Known Allergies  Medications: I have reviewed the patient's current medications.  Results for orders placed or performed during the hospital encounter of 10/18/20 (from the past 48 hour(s))  Comprehensive metabolic panel     Status: Abnormal   Collection Time: 10/18/20  5:39 PM  Result Value  Ref Range   Sodium 136 135 - 145 mmol/L   Potassium 4.8 3.5 - 5.1 mmol/L   Chloride 102 98 - 111 mmol/L   CO2 24 22 - 32 mmol/L   Glucose, Bld 109 (H) 70 - 99 mg/dL    Comment: Glucose reference range applies only to samples taken after fasting for at least 8 hours.   BUN 22 8 - 23 mg/dL   Creatinine, Ser 1.35 (H) 0.44 - 1.00 mg/dL   Calcium 8.7 (L) 8.9 - 10.3 mg/dL   Total Protein 6.6 6.5 - 8.1 g/dL   Albumin 3.0 (L) 3.5 - 5.0 g/dL   AST 13 (L) 15 - 41 U/L   ALT 12 0 - 44 U/L   Alkaline Phosphatase 100  38 - 126 U/L   Total Bilirubin 0.5 0.3 - 1.2 mg/dL   GFR, Estimated 42 (L) >60 mL/min    Comment: (NOTE) Calculated using the CKD-EPI Creatinine Equation (2021)    Anion gap 10 5 - 15    Comment: Performed at [facility], [address], [place], [place] [address]  CBC with Differential/Platelet     Status: Abnormal   Collection Time: 10/18/20  5:39 PM  Result Value Ref Range   WBC 10.2 4.0 - 10.5 K/uL   RBC 3.48 (L) 3.87 - 5.11 MIL/uL   Hemoglobin 10.5 (L) 12.0 - 15.0 g/dL   HCT 33.4 (L) 36.0 - 46.0 %   MCV 96.0 80.0 - 100.0 fL   MCH 30.2 26.0 - 34.0 pg   MCHC 31.4 30.0 - 36.0 g/dL   RDW 17.4 (H) 11.5 - 15.5 %   Platelets 313 150 - 400 K/uL   nRBC 0.0 0.0 - 0.2 %   Neutrophils Relative % 59 %   Neutro Abs 6.1 1.7 - 7.7 K/uL   Lymphocytes Relative 24 %   Lymphs Abs 2.4 0.7 - 4.0 K/uL   Monocytes Relative 12 %   Monocytes Absolute 1.3 (H) 0.1 - 1.0 K/uL   Eosinophils Relative 3 %   Eosinophils Absolute 0.3 0.0 - 0.5 K/uL   Basophils Relative 1 %   Basophils Absolute 0.1 0.0 - 0.1 K/uL   Immature Granulocytes 1 %   Abs Immature Granulocytes 0.05 0.00 - 0.07 K/uL    Comment: Performed at [facility], [address] [place], [place] [address]  Body fluid culture w Gram Stain     Status: None (Preliminary result)   Collection Time: 10/18/20  6:30 PM   Specimen: Wound; Body Fluid  Result Value Ref Range   Specimen Description      WOUND ABCESS Performed at [facility], [address], [place], [place] [address]    Special Requests      Normal Performed at [facility], [address], [place], [place] [address]    Gram Stain      MODERATE WBC PRESENT, PREDOMINANTLY PMN MODERATE GRAM NEGATIVE RODS Performed at [facility], [place] [address], [place],  [address]    Culture PENDING    Report Status PENDING   Resp Panel by RT-PCR (Flu A&B, Covid) Nasopharyngeal Swab     Status: None   Collection Time: 10/18/20  7:00 PM   Specimen: Nasopharyngeal Swab;  Nasopharyngeal(NP) swabs in vial transport medium  Result Value Ref Range   SARS Coronavirus 2 by RT PCR NEGATIVE NEGATIVE    Comment: (NOTE) SARS-CoV-2 target nucleic acids are NOT DETECTED.  The SARS-CoV-2 RNA is generally detectable in upper respiratory specimens during  the acute phase of infection. The lowest concentration of SARS-CoV-2 viral copies this assay can detect is 138 copies/mL. A negative result does not preclude SARS-Cov-2 infection and should not be used as the sole basis for treatment or other patient management decisions. A negative result may occur with  improper specimen collection/handling, submission of specimen other than nasopharyngeal swab, presence of viral mutation(s) within the areas targeted by this assay, and inadequate number of viral copies(<138 copies/mL). A negative result must be combined with clinical observations, patient history, and epidemiological information. The expected result is Negative.  Fact Sheet for Patients:  EntrepreneurPulse.com.au  Fact Sheet for Healthcare Providers:  IncredibleEmployment.be  This test is no t yet approved or cleared by the [place] FDA and  has been authorized for detection and/or diagnosis of SARS-CoV-2 by FDA under an Emergency Use Authorization (EUA). This EUA will remain  in effect (meaning this test can be used) for the duration of the COVID-19 declaration under Section 564(b)(1) of the Act, 21 U.S.C.section 360bbb-3(b)(1), unless the authorization is terminated  or revoked sooner.       Influenza A by PCR NEGATIVE NEGATIVE   Influenza B by PCR NEGATIVE NEGATIVE    Comment: (NOTE) The Xpert Xpress SARS-CoV-2/FLU/RSV plus assay is intended as an aid in the diagnosis of influenza from Nasopharyngeal swab specimens and should not be used as a sole basis for treatment. Nasal washings and aspirates are unacceptable for Xpert Xpress  SARS-CoV-2/FLU/RSV testing.  Fact Sheet for Patients: EntrepreneurPulse.com.au  Fact Sheet for Healthcare Providers: IncredibleEmployment.be  This test is not yet approved or cleared by the [place] FDA and has been authorized for detection and/or diagnosis of SARS-CoV-2 by FDA under an Emergency Use Authorization (EUA). This EUA will remain in effect (meaning this test can be used) for the duration of the COVID-19 declaration under Section 564(b)(1) of the Act, 21 U.S.C. section 360bbb-3(b)(1), unless the authorization is terminated or revoked.  Performed at [facility], [address] [place], [place] [address]   Glucose, capillary     Status: None   Collection Time: 10/18/20 10:28 PM  Result Value Ref Range   Glucose-Capillary 83 70 - 99 mg/dL    Comment: Glucose reference range applies only to samples taken after fasting for at least 8 hours.  CBC     Status: Abnormal   Collection Time: 10/19/20  4:25 AM  Result Value Ref Range   WBC 8.0 4.0 - 10.5 K/uL   RBC 3.28 (L) 3.87 - 5.11 MIL/uL   Hemoglobin 9.6 (L) 12.0 - 15.0 g/dL   HCT 31.7 (L) 36.0 - 46.0 %   MCV 96.6 80.0 - 100.0 fL   MCH 29.3 26.0 - 34.0 pg   MCHC 30.3 30.0 - 36.0 g/dL   RDW 17.3 (H) 11.5 - 15.5 %   Platelets 284 150 - 400 K/uL   nRBC 0.0 0.0 - 0.2 %    Comment: Performed at [facility], [address] [place], [place] [address]  Comprehensive metabolic panel     Status: Abnormal   Collection Time: 10/19/20  4:25 AM  Result Value Ref Range   Sodium 137 135 - 145 mmol/L   Potassium 4.7 3.5 - 5.1 mmol/L   Chloride 103 98 - 111 mmol/L   CO2 24 22 - 32 mmol/L   Glucose, Bld 84 70 - 99 mg/dL    Comment: Glucose reference range applies only to samples taken after fasting for at least 8 hours.   BUN  23 8 - 23 mg/dL   Creatinine, Ser 1.35 (H) 0.44 - 1.00 mg/dL   Calcium 8.5 (L) 8.9 - 10.3 mg/dL   Total Protein 5.7 (L) 6.5 - 8.1 g/dL   Albumin 2.6 (L) 3.5 - 5.0 g/dL    AST 13 (L) 15 - 41 U/L   ALT 9 0 - 44 U/L   Alkaline Phosphatase 87 38 - 126 U/L   Total Bilirubin 0.9 0.3 - 1.2 mg/dL   GFR, Estimated 42 (L) >60 mL/min    Comment: (NOTE) Calculated using the CKD-EPI Creatinine Equation (2021)    Anion gap 10 5 - 15    Comment: Performed at [facility], [address] [place], [place] [address]  Glucose, capillary     Status: None   Collection Time: 10/19/20  7:46 AM  Result Value Ref Range   Glucose-Capillary 83 70 - 99 mg/dL    Comment: Glucose reference range applies only to samples taken after fasting for at least 8 hours.    CT Ankle Right Wo Contrast  Result Date: 10/18/2020 CLINICAL DATA:  Right ankle pain and swelling EXAM: CT OF THE RIGHT ANKLE WITHOUT CONTRAST TECHNIQUE: Multidetector CT imaging of the right ankle was performed according to the standard protocol. Multiplanar CT image reconstructions were also generated. COMPARISON:  X-ray 07/11/2020 FINDINGS: Bones/Joint/Cartilage No acute fracture. No dislocation. Minimal degenerative dorsal spurring within the midfoot. Overall, joint spaces are preserved. Prominent bidirectional calcaneal enthesophytes. No tibiotalar or subtalar joint effusion. No bony erosion or periosteal elevation. Pes planus alignment. Ligaments Suboptimally assessed by CT. Muscles and Tendons Musculotendinous structures are grossly intact. No tenosynovial fluid collection is evident. Soft tissues Diffuse circumferential subcutaneous edema of the lower leg and foot. There is a large fluid collection within the anterior and medial superficial soft tissues of the lower leg extending distally to the level of the distal tibial metaphysis. Collection measures approximately 8.6 x 6.0 cm trans-axially and extends at least 6.5 cm in craniocaudal dimension, although the full superior extent of the collection was not entirely included within the field of view. Internal density of the collection measures approximately 11 HU, suggestive  of simple fluid. No gas is seen within the collection. IMPRESSION: 1. Large fluid collection within the anterior and medial superficial soft tissues of the right lower leg extending distally to the level of the distal tibial metaphysis, measuring at least 8.6 x 6.0 x 6.5 cm with simple fluid internal density. Appearance is nonspecific and could represent a evolving hematoma or seroma. Correlate for signs of infection to exclude possibility of an abscess. No gas within the collection. 2. Nonspecific circumferential subcutaneous edema of the lower leg and foot. 3. No acute osseous findings. Electronically Signed   By: Davina Poke D.O.   On: 10/18/2020 17:57    ROS:  Pertinent items are noted in HPI.  Blood pressure (!) 149/53, pulse 89, temperature 97.9 F (36.6 C), temperature source Oral, resp. rate 18, height 5' 4.5" (1.638 m), weight 115.1 kg, SpO2 100 %. Physical Exam: Pleasant black female no acute distress Head is normocephalic, atraumatic Right lower extremity with packing in place in the pretibial region anteriorly.  This was removed.  Superficial skin abrasions are noted circumferentially.  Difficult to palpate pedal pulses due to the edema that is present.  This does look improved as compared to a previous picture on the chart from vascular surgery.  Assessment/Plan: Impression: Right lower extremity abscess with cellulitis, resolving.  This was in the setting of venous  insufficiency.  Has other multiple medical problems.  Gram stain shows moderate gram-negative rods present.  Moderate white blood cells also seen.  Final ID is pending. Plan: Will adjust wound care.  Awaiting final ID.  Continue IV antibiotics as prescribed.  Keep right lower extremity elevated.  Aviva Signs 10/19/2020, 8:49 AM

## 2020-10-19 NOTE — Plan of Care (Signed)

## 2020-10-20 ENCOUNTER — Ambulatory Visit: Payer: Medicare Other | Admitting: Diagnostic Neuroimaging

## 2020-10-20 DIAGNOSIS — L02415 Cutaneous abscess of right lower limb: Secondary | ICD-10-CM | POA: Diagnosis not present

## 2020-10-20 LAB — BASIC METABOLIC PANEL
Anion gap: 9 (ref 5–15)
BUN: 26 mg/dL — ABNORMAL HIGH (ref 8–23)
CO2: 24 mmol/L (ref 22–32)
Calcium: 8.6 mg/dL — ABNORMAL LOW (ref 8.9–10.3)
Chloride: 105 mmol/L (ref 98–111)
Creatinine, Ser: 1.33 mg/dL — ABNORMAL HIGH (ref 0.44–1.00)
GFR, Estimated: 43 mL/min — ABNORMAL LOW (ref >60)
Glucose, Bld: 89 mg/dL (ref 70–99)
Potassium: 5.1 mmol/L (ref 3.5–5.1)
Sodium: 138 mmol/L (ref 135–145)

## 2020-10-20 LAB — GLUCOSE, CAPILLARY
Glucose-Capillary: 70 mg/dL (ref 70–99)
Glucose-Capillary: 94 mg/dL (ref 70–99)

## 2020-10-20 LAB — BODY FLUID CULTURE W GRAM STAIN: Special Requests: NORMAL

## 2020-10-20 LAB — CBC
HCT: 30 % — ABNORMAL LOW (ref 36.0–46.0)
Hemoglobin: 9.3 g/dL — ABNORMAL LOW (ref 12.0–15.0)
MCH: 29.8 pg (ref 26.0–34.0)
MCHC: 31 g/dL (ref 30.0–36.0)
MCV: 96.2 fL (ref 80.0–100.0)
Platelets: 312 10*3/uL (ref 150–400)
RBC: 3.12 MIL/uL — ABNORMAL LOW (ref 3.87–5.11)
RDW: 17.1 % — ABNORMAL HIGH (ref 11.5–15.5)
WBC: 10.6 10*3/uL — ABNORMAL HIGH (ref 4.0–10.5)
nRBC: 0 % (ref 0.0–0.2)

## 2020-10-20 LAB — MAGNESIUM: Magnesium: 1.6 mg/dL — ABNORMAL LOW (ref 1.7–2.4)

## 2020-10-20 MED ORDER — DOXYCYCLINE HYCLATE 100 MG PO TABS: 100.0000 mg | ORAL_TABLET | Freq: Two times a day (BID) | ORAL | 0 refills | Status: AC

## 2020-10-20 MED ORDER — MAGNESIUM SULFATE 2 GM/50ML IV SOLN
2.0000 g | Freq: Once | INTRAVENOUS | Status: AC
  Administered 2020-10-20: 2 g via INTRAVENOUS
  Filled 2020-10-20: qty 50

## 2020-10-20 NOTE — TOC Initial Note (Addendum)
Transition of Care [facility]) - Initial/Assessment Note    Patient Details  Name: [name] MRN: [id number] Date of Birth: 1951/04/26  Transition of Care [facility]) CM/SW Contact:    Salome Arnt, [place] Phone Number: 10/20/2020, 10:26 AM  Clinical Narrative:  Pt admitted due to right leg abscess. TOC completed assessment due to high risk readmission score. Pt reports she lives alone and has good support from her children who help her with meals and household tasks. Pt's granddaughter is an aid and assists with bathing. Pt states she ambulates with a cane or walker at baseline. She is on 1-2 liters home O2 through [place]. No needs indicated at this time by pt. Discussed home health and pt is agreeable. Will refer for PT and RN. TOC to follow.    Update: Pt does not have PCP as her previous PCP left the practice. Discussed with pt and provided PCP list to establish new PCP. TOC noted pt had lymphedema treatments scheduled several times a week at [facility]. Discussed with MD and pt will need to keep these appointments. However, pt cannot have home health and outpatient due to insurance. Pt and daughter, [name] notified. MD to place order for outpatient wound care. LCSW updated [facility] by voicemail and requested that Outpatient Rehab notify pt of any changes to appointments.                        Expected Discharge Plan: [place] Barriers to Discharge: Continued Medical Work up   Patient Goals and CMS Choice Patient states their goals for this hospitalization and ongoing recovery are:: return home   Choice offered to / list presented to : Patient  Expected Discharge Plan and Services Expected Discharge Plan: [place] In-house Referral: Clinical Social Work   Post Acute Care Choice: [place] arrangements for the past 2 months: [place]                 DME Arranged: N/A                     Prior Living Arrangements/Services Living arrangements for the past 2 months: Single Family Home Lives with:: Self Patient language and need for interpreter reviewed:: Yes Do you feel safe going back to the place where you live?: Yes      Need for Family Participation in Patient Care: Yes (Comment)   Current home services: DME,Safety alert (cane, walker, life alert, shower seat, O2) Criminal Activity/Legal Involvement Pertinent to Current Situation/Hospitalization: No - Comment as needed  Activities of Daily Living Home Assistive Devices/Equipment: Walker (specify type),Cane (specify quad or straight) ADL Screening (condition at time of admission) Patient's cognitive ability adequate to safely complete daily activities?: Yes Is the patient deaf or have difficulty hearing?: No Does the patient have difficulty seeing, even when wearing glasses/contacts?: No Does the patient have difficulty concentrating, remembering, or making decisions?: No Patient able to express need for assistance with ADLs?: Yes Does the patient have difficulty dressing or bathing?: Yes Independently performs ADLs?: No (Patient stated that she needs help dressing and bathing.) Communication: Independent Dressing (OT): Needs assistance Grooming: Needs assistance Feeding: Independent Bathing: Needs assistance Toileting: Independent In/Out Bed: Independent Walks in Home: Independent Does the patient have difficulty walking or climbing stairs?: Yes Weakness of Legs: Both Weakness of Arms/Hands: None  Permission Sought/Granted  Emotional Assessment   Attitude/Demeanor/Rapport: Engaged Affect (typically observed): Accepting Orientation: : Oriented to Place,Oriented to  Time,Oriented to Self Alcohol / Substance Use: Not Applicable Psych Involvement: No (comment)  Admission diagnosis:  Abscess of right leg [L02.415] Abscess of right leg excluding foot [L02.415] Patient Active  Problem List   Diagnosis Date Noted  . Abscess of right lower extremity excluding foot 10/18/2020  . Abscess of right leg 10/18/2020  . Dehydration 10/03/2020  . Leukocytosis 10/03/2020  . Thrombocytosis 10/03/2020  . Hyponatremia 10/03/2020  . Hypochloremia 10/03/2020  . Failure to thrive in adult 10/03/2020  . Acute renal failure superimposed on stage 3b chronic kidney disease ([place]) 10/03/2020  . Uncontrolled type 2 diabetes mellitus with hyperglycemia, without long-term current use of insulin ([place]) 10/03/2020  . Acute kidney injury superimposed on CKD ([place]) 10/02/2020  . Chronic kidney disease 10/01/2020  . 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) 05/12/2020  . Chronic insomnia 01/08/2020  . Ocular myasthenia gravis ([place]) 09/18/2019  . Peripheral edema 01/23/2019  . Asthma 05/14/2015  . Heel spur 02/24/2015  . Depression 02/24/2015  . Post-menopausal bleeding 06/02/2014  . OA (osteoarthritis) of knee 04/09/2014  . Epidermal cyst 10/01/2013  . Class 3 obesity 02/25/2013  . Back pain 03/04/2012  . Gout 01/25/2012  . Edema 01/28/2011  . Obesity hypoventilation syndrome ([place]) 06/16/2008  . OBSTRUCTIVE SLEEP APNEA 05/08/2008  . Hyperlipidemia 08/28/2006  . Anxiety 08/28/2006  . Essential hypertension 08/28/2006  . GERD 08/28/2006  . Osteoarthritis 08/28/2006   PCP:  Pcp, No Pharmacy:   [facility] [place] - [address] AT [place]. [place] [address] Phone: [phone] Fax: [phone]     Social Determinants of Health (SDOH) Interventions    Readmission Risk Interventions Readmission Risk Prevention Plan 10/20/2020 10/04/2020  Transportation Screening Complete Complete  PCP or Specialist Appt within 5-7 Days - Complete  Home Care Screening - Complete  Medication Review (RN CM) - Complete  HRI or Home Care Consult Complete -  Social Work Consult for [place] Planning/Counseling  Complete -  Palliative Care Screening Not Applicable -  Medication Review Press photographer) Complete -  Some recent data might be hidden

## 2020-10-20 NOTE — Plan of Care (Signed)

## 2020-10-20 NOTE — Progress Notes (Signed)
Patients CBG 70mg /dl . She is alert and able to take PO orange juice given x 1. Patient eating breakfast at this time . Will continue to monitor

## 2020-10-20 NOTE — TOC Transition Note (Signed)
Transition of Care [facility]) - CM/SW Discharge Note   Patient Details  Name: [name] MRN: [id number] Date of Birth: 07/28/1950  [facility]) CM/SW Contact:  Salome Arnt, [place] Phone Number: 10/20/2020, 12:27 PM   Clinical Narrative:  Pt d/c today and will go to [facility] for lymphedema treatment and wound care. Pt and daughter aware.        Final next level of care: OP Rehab Barriers to Discharge: Barriers Resolved   Patient Goals and CMS Choice Patient states their goals for this hospitalization and ongoing recovery are:: return home   Choice offered to / list presented to : Patient  Discharge Placement                       Discharge Plan and Services In-house Referral: Clinical Social Work   Post Acute Care Choice: Home Health          DME Arranged: N/A DME Agency: NA                  Social Determinants of Health (SDOH) Interventions     Readmission Risk Interventions Readmission Risk Prevention Plan 10/20/2020 10/04/2020  Transportation Screening Complete Complete  PCP or Specialist Appt within 5-7 Days - Complete  Home Care Screening - Complete  Medication Review (RN CM) - Complete  HRI or Home Care Consult Complete -  Social Work Consult for [place] Planning/Counseling Complete -  Palliative Care Screening Not Applicable -  Medication Review Press photographer) Complete -  Some recent data might be hidden

## 2020-10-20 NOTE — Progress Notes (Signed)
Subjective: Patient has minimal pain in the right lower extremity.  Objective: Vital signs in last 24 hours: Temp:  [97.9 F (36.6 C)-98.8 F (37.1 C)] 97.9 F (36.6 C) (03/29 0546) Pulse Rate:  [85-103] 85 (03/29 0546) Resp:  [16-18] 16 (03/29 0546) BP: (126-149)/(49-60) 126/57 (03/29 0546) SpO2:  [98 %-100 %] 100 % (03/29 0546) Last BM Date: 10/18/20  Intake/Output from previous day: 03/28 0701 - 03/29 0700 In: 1200 [P.O.:800; IV Piggyback:400] Out: 550 [Urine:550] Intake/Output this shift: No intake/output data recorded.  General appearance: alert, cooperative and no distress Extremities: Right lower extremity dressing in place.  Less edema noted in right foot.  Lab Results:  Recent Labs    10/19/20 0425 10/20/20 0458  WBC 8.0 10.6*  HGB 9.6* 9.3*  HCT 31.7* 30.0*  PLT 284 312   BMET Recent Labs    10/19/20 0425 10/20/20 0458  NA 137 138  K 4.7 5.1  CL 103 105  CO2 24 24  GLUCOSE 84 89  BUN 23 26*  CREATININE 1.35* 1.33*  CALCIUM 8.5* 8.6*   PT/INR No results for input(s): LABPROT, INR in the last 72 hours.  Studies/Results: CT Ankle Right Wo Contrast  Result Date: 10/18/2020 CLINICAL DATA:  Right ankle pain and swelling EXAM: CT OF THE RIGHT ANKLE WITHOUT CONTRAST TECHNIQUE: Multidetector CT imaging of the right ankle was performed according to the standard protocol. Multiplanar CT image reconstructions were also generated. COMPARISON:  X-ray 07/11/2020 FINDINGS: Bones/Joint/Cartilage No acute fracture. No dislocation. Minimal degenerative dorsal spurring within the midfoot. Overall, joint spaces are preserved. Prominent bidirectional calcaneal enthesophytes. No tibiotalar or subtalar joint effusion. No bony erosion or periosteal elevation. Pes planus alignment. Ligaments Suboptimally assessed by CT. Muscles and Tendons Musculotendinous structures are grossly intact. No tenosynovial fluid collection is evident. Soft tissues Diffuse circumferential  subcutaneous edema of the lower leg and foot. There is a large fluid collection within the anterior and medial superficial soft tissues of the lower leg extending distally to the level of the distal tibial metaphysis. Collection measures approximately 8.6 x 6.0 cm trans-axially and extends at least 6.5 cm in craniocaudal dimension, although the full superior extent of the collection was not entirely included within the field of view. Internal density of the collection measures approximately 11 HU, suggestive of simple fluid. No gas is seen within the collection. IMPRESSION: 1. Large fluid collection within the anterior and medial superficial soft tissues of the right lower leg extending distally to the level of the distal tibial metaphysis, measuring at least 8.6 x 6.0 x 6.5 cm with simple fluid internal density. Appearance is nonspecific and could represent a evolving hematoma or seroma. Correlate for signs of infection to exclude possibility of an abscess. No gas within the collection. 2. Nonspecific circumferential subcutaneous edema of the lower leg and foot. 3. No acute osseous findings. Electronically Signed   By: Davina Poke D.O.   On: 10/18/2020 17:57    Anti-infectives: Anti-infectives (From admission, onward)   Start     Dose/Rate Route Frequency Ordered Stop   10/19/20 2200  vancomycin (VANCOREADY) IVPB 1250 mg/250 mL  Status:  Discontinued        1,250 mg 166.7 mL/hr over 90 Minutes Intravenous Every 24 hours 10/18/20 2026 10/20/20 0659   10/18/20 2200  piperacillin-tazobactam (ZOSYN) IVPB 3.375 g        3.375 g 12.5 mL/hr over 240 Minutes Intravenous Every 8 hours 10/18/20 1932     10/18/20 2030  vancomycin (VANCOREADY) IVPB 2000  mg/400 mL        2,000 mg 200 mL/hr over 120 Minutes Intravenous  Once 10/18/20 2019 10/18/20 2323   10/18/20 1945  doxycycline (VIBRA-TABS) tablet 100 mg        100 mg Oral  Once 10/18/20 1930 10/18/20 2216   10/18/20 1900  vancomycin (VANCOCIN) IVPB 1000  mg/200 mL premix  Status:  Discontinued        1,000 mg 200 mL/hr over 60 Minutes Intravenous  Once 10/18/20 1851 10/18/20 2019   10/18/20 1715  doxycycline (VIBRA-TABS) tablet 100 mg        100 mg Oral  Once 10/18/20 1713 10/18/20 1802      Assessment/Plan: Impression: Right lower extremity abscess.  Initial ID consistent with Klebsiella.  Sensitivities pending.  Patient has had no fevers or leukocytosis.  Patient was being seen by vascular surgery for wound care.  This can be continued as an outpatient once sensitivities are back.  Continue current wound care.  LOS: 2 days    Aviva Signs 10/20/2020

## 2020-10-20 NOTE — Discharge Summary (Signed)
Physician Discharge Summary  [name] NWG:[id number] DOB: 1951-06-18 DOA: 10/18/2020  PCP: Pcp, No  Admit date: 10/18/2020  Discharge date: 10/20/2020  Admitted From: Home  Disposition: Home  Recommendations for Outpatient Follow-up:  1. Follow up with PCP in 1-2 weeks, this will be scheduled per TOC 2. Please obtain BMP/CBC in one week 3. Continue on doxycycline as prescribed for 12 more days to complete total 14-day course of treatment for complicated purulent cellulitis/abscess with Klebsiella 4. Continue other home medications as prior 5. Attend outpatient wound care and lymphedema treatment as ordered  Home Health: None, outpatient wound care and lymphedema services  Equipment/Devices: Wears nighttime nasal cannula oxygen  Discharge Condition:Stable  CODE STATUS: Full  Diet recommendation: Heart Healthy/carb modified  Brief/Interim Summary:  [name] a 70 y.o.femalewith medical history significant ofHTN, steroid related diabetes, myesthenia gravis, HLD, obesity with hypoventilation syndrome and OSA. She has had a problem with LE edema since December 2021. Her grand daughter was changing her dressing on the day of admission and noted purulent drainage from burst bullae on the right LE leading to presentation to [facility].  Patient was admitted with abscess to the right leg that underwent I&D in the ED. The exudate was sent for culture which returned with Klebsiella that was sensitive to most antibiotics that aside from ampicillin.  She had remained on vancomycin and Zosyn empirically while cultures returned.  She was seen by general surgery with no further recommendations at this time aside from outpatient wound care and lymphedema treatment.  She will be discharged on doxycycline twice daily for 12 more days to complete a total 14-day course of treatment given the complex nature of her cellulitis and the abscess.  She she will have close follow-up with PCP  scheduled and continue monitoring of her wound with outpatient wound care services.  No other acute events noted throughout the course of this brief admission.  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  Discharge Instructions  Discharge Instructions    Ambulatory referral to Wound Clinic   Complete by: As directed    daily cleansing with soap and water, rinse and patting dry followed by moisturizing with Eucerin cream. The dressing wounds will be with an antimicrobial nonadherent (xeroform, Lawson # [id number]), covered with an ABD and securing with a dry boot-Kerlix wrapped from just below toes to just below knees and topped with a 6-inch ACE bandage wrapped in a similar manner.   Diet - low sodium heart healthy   Complete by: As directed    Discharge wound care:   Complete by: As directed    Dressing changes per wound care.   Increase activity slowly   Complete by: As directed      Allergies as of 10/20/2020   No Known Allergies     Medication List    TAKE these medications   acetaminophen 500 MG tablet Commonly known as: TYLENOL Take 1,000 mg by mouth every 6 (six) hours as needed for mild pain.   albuterol 108 (90  Base) MCG/ACT inhaler Commonly known as: VENTOLIN HFA INHALE 2 PUFFS INTO THE LUNGS EVERY 4 HOURS AS NEEDED FOR WHEEZING OR SHORTNESS OF BREATH   allopurinol 100 MG tablet Commonly known as: ZYLOPRIM TAKE 1 TABLET(100 MG) BY MOUTH DAILY What changed: See the new instructions.   aspirin EC 81 MG tablet Take 1 tablet (81 mg  total) by mouth every evening.   atorvastatin 40 MG tablet Commonly known as: LIPITOR TAKE 1 TABLET(40 MG) BY MOUTH DAILY AT 6 PM What changed: See the new instructions.   Blood Glucose System Pak Kit Use as directed to monitor FSBS 3x weekly. Dx: R73.09.   BLOOD GLUCOSE TEST STRIPS Strp Use as directed to monitor FSBS 3x weekly. Dx: R73.09.   busPIRone 5 MG tablet Commonly known as: BUSPAR TAKE 1 TABLET(5 MG) BY MOUTH TWICE DAILY FOR ANXIETY What changed: See the new instructions.   clotrimazole 1 % cream Commonly known as: LOTRIMIN Apply 1 application topically 2 (two) times daily.   diphenhydrAMINE 25 MG tablet Commonly known as: BENADRYL Take 25 mg by mouth daily.   doxycycline 100 MG tablet Commonly known as: VIBRA-TABS Take 1 tablet (100 mg total) by mouth 2 (two) times daily for 12 days.   fluticasone 0.05 % cream Commonly known as: CUTIVATE APPLY EXTERNALLY TO THE AFFECTED AREA DAILY   furosemide 40 MG tablet Commonly known as: LASIX TAKE 1 TABLET(40 MG) BY MOUTH DAILY What changed: See the new instructions.   HYDROcodone-acetaminophen 7.5-325 MG tablet Commonly known as: Norco Take 1 tablet by mouth 2 (two) times daily as needed for moderate pain.   Lancets Misc Use as directed to monitor FSBS 3x weekly. Dx: R73.09.   Magnesium 250 MG Tabs Take 1 tablet (250 mg total) by mouth daily. What changed:   when to take this  additional instructions   metFORMIN 500 MG tablet Commonly known as: GLUCOPHAGE Take 1 tablet (500 mg total) by mouth 2 (two) times daily with a meal.   nystatin powder Commonly known as: MYCOSTATIN/NYSTOP Apply 1 application topically 3 (three) times daily.   omeprazole 40 MG capsule Commonly known as: PRILOSEC TAKE 1 CAPSULE(40 MG) BY MOUTH DAILY What changed: See the new instructions.   OXYGEN Inhale 1 L into the lungs at bedtime. IN ADDITION TO CPAP NIGHTLY   potassium chloride 10 MEQ tablet Commonly known as:  KLOR-CON TAKE 1 TABLET BY MOUTH DAILY WITH FLUID PILL What changed: See the new instructions.   predniSONE 10 MG tablet Commonly known as: DELTASONE Pt currently on 75m daily, start taking 561mdaily for one month, then decrease to 4073maily for 1 month, then decrease 74m26mily. What changed:   how much to take  how to take this  when to take this  additional instructions   pyridostigmine 180 MG CR tablet Commonly known as: MESTINON Take 180 mg by mouth daily.   traZODone 100 MG tablet Commonly known as: DESYREL TAKE 1 TABLET BY MOUTH AT BEDTIME AS NEEDED FOR SLEEP What changed:   reasons to take this  additional instructions            Discharge Care Instructions  (From admission, onward)         Start     Ordered   10/20/20 0000  Discharge wound care:       Comments: Dressing changes per wound care.   10/20/20 1225          Follow-up Information    pcp. Go in 1  week(s).              No Known Allergies  Consultations:  General surgery   Procedures/Studies: DG Chest 2 View  Result Date: 10/02/2020 CLINICAL DATA:  Weakness. EXAM: CHEST - 2 VIEW COMPARISON:  Radiograph 09/23/2020 FINDINGS: Lower lung volumes from prior exam. Upper normal heart size. No focal airspace disease, pleural effusion, or pneumothorax. Stable osseous structures. IMPRESSION: Low lung volumes without acute findings. Electronically Signed   By: Keith Rake M.D.   On: 10/02/2020 22:56   CT Ankle Right Wo Contrast  Result Date: 10/18/2020 CLINICAL DATA:  Right ankle pain and swelling EXAM: CT OF THE RIGHT ANKLE WITHOUT CONTRAST TECHNIQUE: Multidetector CT imaging of the right ankle was performed according to the standard protocol. Multiplanar CT image reconstructions were also generated. COMPARISON:  X-ray 07/11/2020 FINDINGS: Bones/Joint/Cartilage No acute fracture. No dislocation. Minimal degenerative dorsal spurring within the midfoot. Overall, joint spaces are  preserved. Prominent bidirectional calcaneal enthesophytes. No tibiotalar or subtalar joint effusion. No bony erosion or periosteal elevation. Pes planus alignment. Ligaments Suboptimally assessed by CT. Muscles and Tendons Musculotendinous structures are grossly intact. No tenosynovial fluid collection is evident. Soft tissues Diffuse circumferential subcutaneous edema of the lower leg and foot. There is a large fluid collection within the anterior and medial superficial soft tissues of the lower leg extending distally to the level of the distal tibial metaphysis. Collection measures approximately 8.6 x 6.0 cm trans-axially and extends at least 6.5 cm in craniocaudal dimension, although the full superior extent of the collection was not entirely included within the field of view. Internal density of the collection measures approximately 11 HU, suggestive of simple fluid. No gas is seen within the collection. IMPRESSION: 1. Large fluid collection within the anterior and medial superficial soft tissues of the right lower leg extending distally to the level of the distal tibial metaphysis, measuring at least 8.6 x 6.0 x 6.5 cm with simple fluid internal density. Appearance is nonspecific and could represent a evolving hematoma or seroma. Correlate for signs of infection to exclude possibility of an abscess. No gas within the collection. 2. Nonspecific circumferential subcutaneous edema of the lower leg and foot. 3. No acute osseous findings. Electronically Signed   By: Davina Poke D.O.   On: 10/18/2020 17:57   US RENAL  Result Date: 10/04/2020 CLINICAL DATA:  Acute kidney injury EXAM: RENAL / URINARY TRACT ULTRASOUND COMPLETE COMPARISON:  None. FINDINGS: Right Kidney: Renal measurements: 10.6 x 4.9 x 5.1 cm = volume: 138.3 mL. Echogenicity and renal cortical thickness are within normal limits. No mass, perinephric fluid, or hydronephrosis visualized. No sonographically demonstrable calculus or ureterectasis.  Left Kidney: Renal measurements: 8.1 x 3.5 x 3.6 cm = volume: 53.8 mL. Echogenicity within normal limits. There is renal cortical thinning. No mass, perinephric fluid, or hydronephrosis visualized. No sonographically demonstrable calculus or ureterectasis. Bladder: Appears normal for degree of bladder distention. Other: Liver echogenicity increased diffusely. IMPRESSION: 1. Small right kidney with renal cortical thinning. Etiology for these changes uncertain. Renal artery stenosis is a potential etiology for these changes. In this regard, question whether patient is hypertensive. 2. No obstructing focus in either kidney. Renal echogenicity within normal limits bilaterally. 3. Increased liver echogenicity, a finding likely due to hepatic steatosis. Electronically Signed   By: Lowella Grip III M.D.   On: 10/04/2020 10:36   DG Chest Portable 1 View  Result Date: 09/23/2020 CLINICAL DATA:  Increased peripheral edema EXAM: PORTABLE CHEST 1 VIEW COMPARISON:  07/11/2020  FINDINGS: Heart is borderline in size. Lingular scarring or atelectasis at the left base. Right lung clear. No effusions or edema. No acute bony abnormality. IMPRESSION: Lingular scarring or atelectasis.  No active disease. Electronically Signed   By: Rolm Baptise M.D.   On: 09/23/2020 10:41   VAS [place] LOWER EXTREMITY VENOUS REFLUX  Result Date: 09/28/2020  Lower Venous Reflux Study Indications: Pain, and Swelling.  Limitations: Poor ultrasound/tissue interface and poor positioning. Comparison Study: 01/31/2011: No evidence of deep or superficial thrombosis                   bilaterally Performing Technologist: Ivan Croft  Examination Guidelines: A complete evaluation includes B-mode imaging, spectral Doppler, color Doppler, and power Doppler as needed of all accessible portions of each vessel. Bilateral testing is considered an integral part of a complete examination. Limited examinations for reoccurring indications may be performed as noted.  The reflux portion of the exam is performed with the patient in reverse Trendelenburg. Significant venous reflux is defined as >500 ms in the superficial venous system, and >1 second in the deep venous system.  Venous Reflux Times +--------------+---------+------+-----------+------------+-------------+ RIGHT         Reflux NoRefluxReflux TimeDiameter cmsComments                              Yes                                       +--------------+---------+------+-----------+------------+-------------+ CFV           no                                                  +--------------+---------+------+-----------+------------+-------------+ FV mid        no                                                  +--------------+---------+------+-----------+------------+-------------+ Popliteal     no                                                  +--------------+---------+------+-----------+------------+-------------+ GSV at [facility]    no                            0.51                  +--------------+---------+------+-----------+------------+-------------+ GSV prox thighno                            0.27                  +--------------+---------+------+-----------+------------+-------------+ GSV mid thigh no                            0.15                  +--------------+---------+------+-----------+------------+-------------+  GSV dist thighno                            0.14    out of fascia +--------------+---------+------+-----------+------------+-------------+ GSV at knee   no                            0.17                  +--------------+---------+------+-----------+------------+-------------+ GSV prox calf           yes    >500 ms      0.18                  +--------------+---------+------+-----------+------------+-------------+ SSV Pop Fossa no                            0.19                   +--------------+---------+------+-----------+------------+-------------+ SSV prox calf no                            0.13                  +--------------+---------+------+-----------+------------+-------------+ SSV mid calf  no                            0.20                  +--------------+---------+------+-----------+------------+-------------+  +--------------+---------+------+-----------+------------+-------------+ LEFT          Reflux NoRefluxReflux TimeDiameter cmsComments                              Yes                                       +--------------+---------+------+-----------+------------+-------------+ CFV           no                                                  +--------------+---------+------+-----------+------------+-------------+ FV mid        no                                                  +--------------+---------+------+-----------+------------+-------------+ Popliteal     no                                                  +--------------+---------+------+-----------+------------+-------------+ GSV at SFJ              yes    >500 ms      0.67                  +--------------+---------+------+-----------+------------+-------------+ GSV prox thighno  0.29                  +--------------+---------+------+-----------+------------+-------------+ GSV mid thigh no                            0.28    out of fascia +--------------+---------+------+-----------+------------+-------------+ GSV dist thighno                            0.23                  +--------------+---------+------+-----------+------------+-------------+ GSV at knee             yes    >500 ms      0.16                  +--------------+---------+------+-----------+------------+-------------+ GSV prox calf           yes    >500 ms      0.16                   +--------------+---------+------+-----------+------------+-------------+ SSV Pop Fossa           yes    >500 ms      0.33                  +--------------+---------+------+-----------+------------+-------------+ SSV prox calf           yes    >500 ms      0.17                  +--------------+---------+------+-----------+------------+-------------+ SSV mid calf            yes    >500 ms      0.24                  +--------------+---------+------+-----------+------------+-------------+   Summary: Bilateral: - No evidence of deep vein thrombosis seen in the lower extremities, bilaterally, from the common femoral through the popliteal veins. - No evidence of superficial venous thrombosis in the lower extremities, bilaterally.  Right: - Venous reflux is noted in the right greater saphenous vein in the calf.  Left: - Venous reflux is noted in the left sapheno-femoral junction. - Venous reflux is noted in the left greater saphenous vein at the knee and in the calf. - Venous reflux is noted in the left short saphenous vein.  *See table(s) above for measurements and observations. Electronically signed by Servando Snare MD on 09/28/2020 at 2:15:06 PM.    Final       Discharge Exam: Vitals:   10/19/20 2102 10/20/20 0546  BP: (!) 134/57 (!) 126/57  Pulse: 89 85  Resp: 16 16  Temp: 98.8 F (37.1 C) 97.9 F (36.6 C)  SpO2: 98% 100%   Vitals:   10/19/20 1258 10/19/20 1500 10/19/20 2102 10/20/20 0546  BP: (!) 149/52 (!) 146/60 (!) 134/57 (!) 126/57  Pulse: (!) 101 (!) 103 89 85  Resp: 18 16 16 16   Temp: 98.8 F (37.1 C) 98.2 F (36.8 C) 98.8 F (37.1 C) 97.9 F (36.6 C)  TempSrc: Oral Oral Oral Oral  SpO2: 100% 98% 98% 100%  Weight:      Height:        General: Pt is alert, awake, not in acute distress, morbid obesity Cardiovascular: RRR, S1/S2 +, no rubs, no gallops Respiratory: CTA bilaterally, no wheezing, no rhonchi Abdominal: Soft, NT, ND, bowel sounds +  Extremities:  chronic edema, no cyanosis, dressings to right ankle clean dry and intact    The results of significant diagnostics from this hospitalization (including imaging, microbiology, ancillary and laboratory) are listed below for reference.     Microbiology: Recent Results (from the past 240 hour(s))  Body fluid culture w Gram Stain     Status: None   Collection Time: 10/18/20  6:30 PM   Specimen: Wound; Body Fluid  Result Value Ref Range Status   Specimen Description   Final    WOUND ABCESS Performed at [facility], [address], [place], [place] [address]    Special Requests   Final    Normal Performed at [facility], [address] [place], [place] [address]    Gram Stain   Final    MODERATE WBC PRESENT, PREDOMINANTLY PMN MODERATE GRAM NEGATIVE RODS Performed at [facility], [place] [address] [place], [place] [address]    Culture RARE KLEBSIELLA OXYTOCA  Final   Report Status 10/20/2020 FINAL  Final   Organism ID, Bacteria KLEBSIELLA OXYTOCA  Final      Susceptibility   Klebsiella oxytoca - MIC*    AMPICILLIN >=32 RESISTANT Resistant     CEFAZOLIN <=4 SENSITIVE Sensitive     CEFEPIME <=0.12 SENSITIVE Sensitive     CEFTAZIDIME <=1 SENSITIVE Sensitive     CEFTRIAXONE <=0.25 SENSITIVE Sensitive     CIPROFLOXACIN <=0.25 SENSITIVE Sensitive     GENTAMICIN <=1 SENSITIVE Sensitive     IMIPENEM <=0.25 SENSITIVE Sensitive     TRIMETH/SULFA <=20 SENSITIVE Sensitive     AMPICILLIN/SULBACTAM 4 SENSITIVE Sensitive     PIP/TAZO <=4 SENSITIVE Sensitive     * RARE KLEBSIELLA OXYTOCA  Resp Panel by RT-PCR (Flu A&B, Covid) Nasopharyngeal Swab     Status: None   Collection Time: 10/18/20  7:00 PM   Specimen: Nasopharyngeal Swab; Nasopharyngeal(NP) swabs in vial transport medium  Result Value Ref Range Status   SARS Coronavirus 2 by RT PCR NEGATIVE NEGATIVE Final    Comment: (NOTE) SARS-CoV-2 target nucleic acids are NOT DETECTED.  The SARS-CoV-2 RNA is generally detectable in  upper respiratory specimens during the acute phase of infection. The lowest concentration of SARS-CoV-2 viral copies this assay can detect is 138 copies/mL. A negative result does not preclude SARS-Cov-2 infection and should not be used as the sole basis for treatment or other patient management decisions. A negative result may occur with  improper specimen collection/handling, submission of specimen other than nasopharyngeal swab, presence of viral mutation(s) within the areas targeted by this assay, and inadequate number of viral copies(<138 copies/mL). A negative result must be combined with clinical observations, patient history, and epidemiological information. The expected result is Negative.  Fact Sheet for Patients:  EntrepreneurPulse.com.au  Fact Sheet for Healthcare Providers:  IncredibleEmployment.be  This test is no t yet approved or cleared by the [place] FDA and  has been authorized for detection and/or diagnosis of SARS-CoV-2 by FDA under an Emergency Use Authorization (EUA). This EUA will remain  in effect (meaning this test can be used) for the duration of the COVID-19 declaration under Section 564(b)(1) of the Act, 21 U.S.C.section 360bbb-3(b)(1), unless the authorization is terminated  or revoked sooner.       Influenza A by PCR NEGATIVE NEGATIVE Final   Influenza B by PCR NEGATIVE NEGATIVE Final    Comment: (NOTE) The Xpert Xpress SARS-CoV-2/FLU/RSV plus assay is intended as an aid in the diagnosis of influenza from Nasopharyngeal swab  specimens and should not be used as a sole basis for treatment. Nasal washings and aspirates are unacceptable for Xpert Xpress SARS-CoV-2/FLU/RSV testing.  Fact Sheet for Patients: EntrepreneurPulse.com.au  Fact Sheet for Healthcare Providers: IncredibleEmployment.be  This test is not yet approved or cleared by the [place] FDA and has been  authorized for detection and/or diagnosis of SARS-CoV-2 by FDA under an Emergency Use Authorization (EUA). This EUA will remain in effect (meaning this test can be used) for the duration of the COVID-19 declaration under Section 564(b)(1) of the Act, 21 U.S.C. section 360bbb-3(b)(1), unless the authorization is terminated or revoked.  Performed at [facility], [address], [place], [place] [address]   MRSA PCR Screening     Status: None   Collection Time: 10/19/20 12:06 PM   Specimen: Nasopharyngeal  Result Value Ref Range Status   MRSA by PCR NEGATIVE NEGATIVE Final    Comment:        The GeneXpert MRSA Assay (FDA approved for NASAL specimens only), is one component of a comprehensive MRSA colonization surveillance program. It is not intended to diagnose MRSA infection nor to guide or monitor treatment for MRSA infections. Performed at [facility], [address] [place], [place] [address]      Labs: BNP (last 3 results) Recent Labs    09/23/20 1135  BNP 90.2   Basic Metabolic Panel: Recent Labs  Lab 10/18/20 1739 10/19/20 0425 10/20/20 0458  NA 136 137 138  K 4.8 4.7 5.1  CL 102 103 105  CO2 24 24 24   GLUCOSE 109* 84 89  BUN 22 23 26*  CREATININE 1.35* 1.35* 1.33*  CALCIUM 8.7* 8.5* 8.6*  MG  --   --  1.6*   Liver Function Tests: Recent Labs  Lab 10/18/20 1739 10/19/20 0425  AST 13* 13*  ALT 12 9  ALKPHOS 100 87  BILITOT 0.5 0.9  PROT 6.6 5.7*  ALBUMIN 3.0* 2.6*   No results for input(s): LIPASE, AMYLASE in the last 168 hours. No results for input(s): AMMONIA in the last 168 hours. CBC: Recent Labs  Lab 10/18/20 1739 10/19/20 0425 10/20/20 0458  WBC 10.2 8.0 10.6*  NEUTROABS 6.1  --   --   HGB 10.5* 9.6* 9.3*  HCT 33.4* 31.7* 30.0*  MCV 96.0 96.6 96.2  PLT 313 284 312   Cardiac Enzymes: No results for input(s): CKTOTAL, CKMB, CKMBINDEX, TROPONINI in the last 168 hours. BNP: Invalid input(s): POCBNP CBG: Recent Labs  Lab  10/19/20 1105 10/19/20 1630 10/19/20 2103 10/20/20 0724 10/20/20 1122  GLUCAP 126* 115* 174* 70 94   D-Dimer No results for input(s): DDIMER in the last 72 hours. Hgb A1c No results for input(s): HGBA1C in the last 72 hours. Lipid Profile No results for input(s): CHOL, HDL, LDLCALC, TRIG, CHOLHDL, LDLDIRECT in the last 72 hours. Thyroid function studies No results for input(s): TSH, T4TOTAL, T3FREE, THYROIDAB in the last 72 hours.  Invalid input(s): FREET3 Anemia work up No results for input(s): VITAMINB12, FOLATE, FERRITIN, TIBC, IRON, RETICCTPCT in the last 72 hours. Urinalysis    Component Value Date/Time   COLORURINE YELLOW 10/03/2020 1646   APPEARANCEUR HAZY (A) 10/03/2020 1646   LABSPEC 1.010 10/03/2020 1646   PHURINE 5.0 10/03/2020 1646   GLUCOSEU NEGATIVE 10/03/2020 1646   HGBUR NEGATIVE 10/03/2020 1646   BILIRUBINUR NEGATIVE 10/03/2020 1646   KETONESUR NEGATIVE 10/03/2020 1646   PROTEINUR NEGATIVE 10/03/2020 1646   NITRITE NEGATIVE 10/03/2020 [place] 10/03/2020 1646  Sepsis Labs Invalid input(s): PROCALCITONIN,  WBC,  LACTICIDVEN Microbiology Recent Results (from the past 240 hour(s))  Body fluid culture w Gram Stain     Status: None   Collection Time: 10/18/20  6:30 PM   Specimen: Wound; Body Fluid  Result Value Ref Range Status   Specimen Description   Final    WOUND ABCESS Performed at [facility], [address] [place], [place] [address]    Special Requests   Final    Normal Performed at [facility], [address], [place], [place] [address]    Gram Stain   Final    MODERATE WBC PRESENT, PREDOMINANTLY PMN MODERATE GRAM NEGATIVE RODS Performed at [facility], [place] [address], [place], [place] [address]    Culture RARE KLEBSIELLA OXYTOCA  Final   Report Status 10/20/2020 FINAL  Final   Organism ID, Bacteria KLEBSIELLA OXYTOCA  Final      Susceptibility   Klebsiella oxytoca - MIC*    AMPICILLIN >=32 RESISTANT  Resistant     CEFAZOLIN <=4 SENSITIVE Sensitive     CEFEPIME <=0.12 SENSITIVE Sensitive     CEFTAZIDIME <=1 SENSITIVE Sensitive     CEFTRIAXONE <=0.25 SENSITIVE Sensitive     CIPROFLOXACIN <=0.25 SENSITIVE Sensitive     GENTAMICIN <=1 SENSITIVE Sensitive     IMIPENEM <=0.25 SENSITIVE Sensitive     TRIMETH/SULFA <=20 SENSITIVE Sensitive     AMPICILLIN/SULBACTAM 4 SENSITIVE Sensitive     PIP/TAZO <=4 SENSITIVE Sensitive     * RARE KLEBSIELLA OXYTOCA  Resp Panel by RT-PCR (Flu A&B, Covid) Nasopharyngeal Swab     Status: None   Collection Time: 10/18/20  7:00 PM   Specimen: Nasopharyngeal Swab; Nasopharyngeal(NP) swabs in vial transport medium  Result Value Ref Range Status   SARS Coronavirus 2 by RT PCR NEGATIVE NEGATIVE Final    Comment: (NOTE) SARS-CoV-2 target nucleic acids are NOT DETECTED.  The SARS-CoV-2 RNA is generally detectable in upper respiratory specimens during the acute phase of infection. The lowest concentration of SARS-CoV-2 viral copies this assay can detect is 138 copies/mL. A negative result does not preclude SARS-Cov-2 infection and should not be used as the sole basis for treatment or other patient management decisions. A negative result may occur with  improper specimen collection/handling, submission of specimen other than nasopharyngeal swab, presence of viral mutation(s) within the areas targeted by this assay, and inadequate number of viral copies(<138 copies/mL). A negative result must be combined with clinical observations, patient history, and epidemiological information. The expected result is Negative.  Fact Sheet for Patients:  EntrepreneurPulse.com.au  Fact Sheet for Healthcare Providers:  IncredibleEmployment.be  This test is no t yet approved or cleared by the [place] FDA and  has been authorized for detection and/or diagnosis of SARS-CoV-2 by FDA under an Emergency Use Authorization (EUA). This EUA  will remain  in effect (meaning this test can be used) for the duration of the COVID-19 declaration under Section 564(b)(1) of the Act, 21 U.S.C.section 360bbb-3(b)(1), unless the authorization is terminated  or revoked sooner.       Influenza A by PCR NEGATIVE NEGATIVE Final   Influenza B by PCR NEGATIVE NEGATIVE Final    Comment: (NOTE) The Xpert Xpress SARS-CoV-2/FLU/RSV plus assay is intended as an aid in the diagnosis of influenza from Nasopharyngeal swab specimens and should not be used as a sole basis for treatment. Nasal washings and aspirates are unacceptable for Xpert Xpress SARS-CoV-2/FLU/RSV testing.  Fact Sheet for Patients: EntrepreneurPulse.com.au  Fact Sheet for Healthcare Providers: IncredibleEmployment.be  This test is not yet approved or cleared by the [place] FDA and has been authorized for detection and/or diagnosis of SARS-CoV-2 by FDA under an Emergency Use Authorization (EUA). This EUA will remain in effect (meaning this test can be used) for the duration of the COVID-19 declaration under Section 564(b)(1) of the Act, 21 U.S.C. section 360bbb-3(b)(1), unless the authorization is terminated or revoked.  Performed at [facility], [address], [place], [place] [address]   MRSA PCR Screening     Status: None   Collection Time: 10/19/20 12:06 PM   Specimen: Nasopharyngeal  Result Value Ref Range Status   MRSA by PCR NEGATIVE NEGATIVE Final    Comment:        The GeneXpert MRSA Assay (FDA approved for NASAL specimens only), is one component of a comprehensive MRSA colonization surveillance program. It is not intended to diagnose MRSA infection nor to guide or monitor treatment for MRSA infections. Performed at [facility], [address], [place], [place] [address]      Time coordinating discharge: 35 minutes  SIGNED:   Rodena Goldmann, DO [facility] 10/20/2020, 12:28 PM  If 7PM-7AM,  please contact night-coverage [email or web address]

## 2020-10-20 NOTE — Care Management Important Message (Signed)
Important Message  Patient Details  Name: [name] MRN: [id number] Date of Birth: 1951/06/24   Medicare Important Message Given:  Yes     Tommy Medal 10/20/2020, 2:15 PM

## 2020-10-21 ENCOUNTER — Telehealth: Payer: Self-pay

## 2020-10-21 ENCOUNTER — Encounter (HOSPITAL_COMMUNITY): Payer: Medicare Other

## 2020-10-21 NOTE — Telephone Encounter (Signed)
Called patient # and spoke with daughter [name].  [facility] will not accept this patient as new patient.  Daughter wanted to talk with OM at [facility].  I sent [place] a message to call the daughter.  [name] [phone]- Patient daughter is very upset.

## 2020-10-21 NOTE — Chronic Care Management (AMB) (Signed)
  Chronic Care Management   Outreach Note  10/21/2020 Name: ROSSI SILVESTRO MRN: [id number] DOB: [birth year]  Theone Murdoch Pospisil is a 70 y.o. year old female who is a primary care patient of Pcp, No. I reached out to [name] by phone today in response to a referral sent by Ms. Jenel H Uram's PCP, Noemi Chapel ,NP     Third unsuccessful telephone outreach was attempted today. The patient was referred to the case management team for assistance with care management and care coordination. The patient's primary care provider has been notified of our unsuccessful attempts to make or maintain contact with the patient. The care management team is pleased to engage with this patient at any time in the future should he/she be interested in assistance from the care management team.   Follow Up Plan: We have been unable to make contact with the patient .Marland Kitchen The care management team is available to follow up with the patient after provider conversation with the patient regarding recommendation for care management engagement and subsequent re-referral to the care management team.   [place] [facility]

## 2020-10-22 ENCOUNTER — Telehealth: Payer: Self-pay | Admitting: *Deleted

## 2020-10-22 NOTE — Chronic Care Management (AMB) (Addendum)
  Chronic Care Management   Note  10/22/2020 Name: [name] MRN: [id number] DOB: 05/24/1951  [name] is a 70 y.o. year old female who is a primary care patient of PCP, Martinez,Jessica,NP . I reached out to [name] by phone today in response to a referral sent by [name]'s PCP, Martinez,Jessica,NP     [name] daughter [name] was given information about Chronic Care Management services today including:  1. CCM service includes personalized support from designated clinical staff supervised by her physician, including individualized plan of care and coordination with other care providers 2. 24/7 contact phone numbers for assistance for urgent and routine care needs. 3. Service will only be billed when office clinical staff spend 20 minutes or more in a month to coordinate care. 4. Only one practitioner may furnish and bill the service in a calendar month. 5. The patient may stop CCM services at any time (effective at the end of the month) by phone call to the office staff. 6. The patient will be responsible for cost sharing (co-pay) of up to 20% of the service fee (after annual deductible is met).  Patient's daughter [name] agreed to services and verbal consent obtained.   Follow up plan: Telephone appointment with care management team member scheduled for: 10/26/2020  [place] [facility]

## 2020-10-22 NOTE — Telephone Encounter (Addendum)
I reached out to [facility] per her request. She was very upset that her mom was dropped from our practice without being set up with another provider. When I tried to talk she talked over me. I waited until she wasn't talking to try and explain that she was dropped from our practice that her PCP had left and that we just didn't have another provider that was able to take over Dr. Dorian Heckle patients. She interrupted me saying "can I talk" "this shouldn't be put on her children or the patient to find another PCP" "we have busy lives and that she wasn't accepted by any of the providers that we recommended to her since her mom was on a narcotic".  I apologized to [place] and told her that I was sorry that she felt the way she did and "she told me to keep my apology"."She ended the call by saying have a good day". I replied with I hope you do as well.

## 2020-10-22 NOTE — Chronic Care Management (AMB) (Signed)
  Chronic Care Management   Outreach Note  10/22/2020 Name: [name] MRN: [id number] DOB: 04-08-1951  [name] is a 70 y.o. year old female who is a primary care patient of Pcp, Noemi Chapel ,NP. I reached out to [name] by phone today in response to a referral sent by [name] PCP, Noemi Chapel, NP     An unsuccessful telephone outreach was attempted today.   Follow Up Plan: The care management team will reach out to the patient again over the next 7 days.  If patient returns call to provider office, please advise to call [place] Lysle Morales at [place] [facility]

## 2020-10-23 ENCOUNTER — Encounter (HOSPITAL_COMMUNITY): Payer: Self-pay | Admitting: Physical Therapy

## 2020-10-23 ENCOUNTER — Ambulatory Visit (HOSPITAL_COMMUNITY): Payer: Medicare Other | Attending: Internal Medicine | Admitting: Physical Therapy

## 2020-10-23 ENCOUNTER — Ambulatory Visit (HOSPITAL_COMMUNITY): Payer: Medicare Other

## 2020-10-23 ENCOUNTER — Ambulatory Visit (HOSPITAL_COMMUNITY): Payer: Medicare Other | Admitting: Physical Therapy

## 2020-10-23 ENCOUNTER — Other Ambulatory Visit: Payer: Self-pay

## 2020-10-23 DIAGNOSIS — I890 Lymphedema, not elsewhere classified: Secondary | ICD-10-CM | POA: Insufficient documentation

## 2020-10-23 DIAGNOSIS — L02415 Cutaneous abscess of right lower limb: Secondary | ICD-10-CM | POA: Diagnosis not present

## 2020-10-23 DIAGNOSIS — R600 Localized edema: Secondary | ICD-10-CM | POA: Insufficient documentation

## 2020-10-23 DIAGNOSIS — R262 Difficulty in walking, not elsewhere classified: Secondary | ICD-10-CM | POA: Insufficient documentation

## 2020-10-23 DIAGNOSIS — R601 Generalized edema: Secondary | ICD-10-CM | POA: Insufficient documentation

## 2020-10-23 NOTE — Therapy (Signed)
[facility] [address] [place], [place], [address] Phone: [phone]   Fax:  [phone]  Physical Therapy Evaluation  Patient Details  Name: [name] MRN: [id number] Date of Birth: 10/14/1950 No data recorded  Encounter Date: 10/23/2020   PT End of Session - 10/23/20 1026    Visit Number 1    Number of Visits 18    Date for PT Re-Evaluation 12/04/20    Authorization Type U[employer] Medicare (no visit limit, no auth)    Progress Note Due on Visit 10    PT Start Time 0910    PT Stop Time 1008    PT Time Calculation (min) 58 min    Activity Tolerance Patient tolerated treatment well    Behavior During Therapy [facility] for tasks assessed/performed           Past Medical History:  Diagnosis Date  . Achilles tendinitis   . Anxiety   . Asthma   . Depression   . GERD (gastroesophageal reflux disease)   . Gout   . H/O myasthenia gravis    left eye  . HTN (hypertension)   . Hyperlipidemia   . Low back pain   . Obesity hypoventilation syndrome ([place])   . Obstructive sleep apnea   . Osteoarthritis     Past Surgical History:  Procedure Laterality Date  . CATARACT EXTRACTION W/PHACO Left 09/17/2015   Procedure: CATARACT EXTRACTION PHACO AND INTRAOCULAR LENS PLACEMENT LEFT EYE cde=8.58;  Surgeon: Tonny Branch, MD;  Location: [facility];  Service: Ophthalmology;  Laterality: Left;  . CATARACT EXTRACTION W/PHACO Right 05/15/2017   Procedure: CATARACT EXTRACTION PHACO AND INTRAOCULAR LENS PLACEMENT (IOC);  Surgeon: Tonny Branch, MD;  Location: [facility];  Service: Ophthalmology;  Laterality: Right;  CDE: 6.34  . COLONOSCOPY N/A 12/25/2013   Procedure: COLONOSCOPY;  Surgeon: Rogene Houston, MD;  Location: [facility];  Service: Endoscopy;  Laterality: N/A;  730-rescheduled Ann notified pt  . COLONOSCOPY WITH PROPOFOL N/A 10/04/2019   Procedure: COLONOSCOPY WITH PROPOFOL;  Surgeon: Rogene Houston, MD;  Location: [facility];  Service: Endoscopy;   Laterality: N/A;  815  . CYST EXCISION N/A 09/12/2016   Procedure: EXCISION SEBACEOUS CYST, BACK;  Surgeon: Aviva Signs, MD;  Location: [facility];  Service: General;  Laterality: N/A;  . INTRAOCULAR LENS INSERTION Bilateral 2018   Dr. Tonny Branch   . POLYPECTOMY  10/04/2019   Procedure: POLYPECTOMY;  Surgeon: Rogene Houston, MD;  Location: [facility];  Service: Endoscopy;;  . TUBAL LIGATION      There were no vitals filed for this visit.    Subjective Assessment - 10/23/20 1050    Subjective Patient is a 70 y.o. female who presents to physical therapy with referral for abscess of RLE excluding foot. Patient has had numerous hospitalization due to both legs swelling and wounds. She was just discharged from the hospital this week and has been having a lot of leg pain. She has trouble walking due to her swelling and painful wounds and has not had any other treatment for her legs besides going to the hospital. Patient states history of hypertension and kidney problems. She is not on any blood thinners.    Limitations House hold activities;Walking    Currently in Pain? Yes    Pain Score 8     Pain Location Leg    Pain Orientation Right;Left    Pain Descriptors / Indicators Aching;Sore    Pain Type Chronic  pain    Pain Frequency Constant    Aggravating Factors  movement                 LYMPHEDEMA/ONCOLOGY QUESTIONNAIRE - 10/23/20 0001      What other symptoms do you have   Are you having Pain Yes      Lymphedema Assessments   Lymphedema Assessments Lower extremities      Right Lower Extremity Lymphedema   30 cm Proximal to Floor at Lateral Plantar Foot 48.5 cm    20 cm Proximal to Floor at Lateral Plantar Foot 40.5 1    10  cm Proximal to Floor at Lateral Malleoli 30 cm      Left Lower Extremity Lymphedema   30 cm Proximal to Floor at Lateral Plantar Foot 50.5 cm    20 cm Proximal to Floor at Lateral Plantar Foot 45 cm    10 cm Proximal to Floor at Lateral Malleoli 39 cm                    Objective measurements completed on examination: See above findings.               PT Education - 10/23/20 1039    Education Details Patient educated on exam findings, POC, use and importance of compression, lymphedema, elevation of LE    Person(s) Educated Patient    Methods Explanation    Comprehension Verbalized understanding            PT Short Term Goals - 10/23/20 1033      PT SHORT TERM GOAL #1   Title Patient will be independent in self management strategies in order to reduce the risk of wound reoccurance.    Time 3    Period Weeks    Status New    Target Date 11/13/20             PT Long Term Goals - 10/23/20 1034      PT LONG TERM GOAL #1   Title Patient will be able to don/doff compression garments independently in order to manage LE edema.    Time 6    Period Weeks    Status New    Target Date 12/04/20      PT LONG TERM GOAL #2   Title PT to understand and verbalize that she needs to wear her compression garment and pump on a daily basis at D/C or edema will return    Time 6    Period Weeks    Status New    Target Date 12/04/20      PT LONG TERM GOAL #3   Title Patient will be able to wear her shoes again for improved ambulation ability.    Time 6    Period Weeks    Status New    Target Date 12/04/20                  Plan - 10/23/20 1027    Clinical Impression Statement Patient is a 70 y.o. female who presents to physical therapy with referral for abscess of RLE excluding foot. Patient presents with significant bilateral LE edema with RLE anterior wound weeping. Patient with newly healed wounds noted on medial tibial region bilaterally as well as newly healed wounds on R posterior and lateral lower leg. Patient will transition to lymphedema management due to bilateral LE edema causing reoccurring hospitalizations and wounds. Cleansed bilateral LE and applied lotion due to dry, peeling skin on  bilateral  LE. Applied Vaseline around RLE weeping wound to decrease maceration and dressed weeping anterior wound with alginate dressing. Applied Profore dressing to bilateral LE for edema management. Educated patient extensively today on importance of compression for wound healing and edema management. Will also submit paperwork to try to get patient a pump to further assist with edema management. Patient will continue to benefit from skilled physical therapy for lymphedema and wound management.    Personal Factors and Comorbidities Fitness;Behavior Pattern;Past/Current Experience;Comorbidity 3+;Time since onset of injury/illness/exacerbation;Transportation    Comorbidities lymphedema, HTN, obesity    Examination-Activity Limitations Bathing;Locomotion Level;Transfers;Stairs;Stand;Dressing;Hygiene/Grooming    Examination-Participation Restrictions Meal Prep;Community Activity;Volunteer;Yard Work;Shop;Laundry;Cleaning    Stability/Clinical Decision Making Evolving/Moderate complexity    Clinical Decision Making Moderate    Rehab Potential Good    PT Frequency 3x / week    PT Duration 6 weeks    PT Treatment/Interventions ADLs/Self Care Home Management;Manual techniques;Manual lymph drainage;Compression bandaging;Gait training;Stair training;Functional mobility training;Therapeutic activities;Therapeutic exercise;Balance training;Neuromuscular re-education;Vasopneumatic Device;Taping;Splinting;Energy conservation;Passive range of motion;Scar mobilization;Patient/family education;DME Instruction    PT Next Visit Plan lymphedema management, wound care    PT Home Exercise Plan ankle pumps, elevation    Consulted and Agree with Plan of Care Patient           Patient will benefit from skilled therapeutic intervention in order to improve the following deficits and impairments:  Abnormal gait,Cardiopulmonary status limiting activity,Decreased endurance,Decreased activity tolerance,Decreased skin  integrity,Pain,Increased edema,Decreased mobility  Visit Diagnosis: Lymphedema, not elsewhere classified  Abscess of right lower extremity excluding foot  Difficulty in walking, not elsewhere classified  Bilateral lower extremity edema     Problem List Patient Active Problem List   Diagnosis Date Noted  . Abscess of right lower extremity excluding foot 10/18/2020  . Abscess of right leg 10/18/2020  . Dehydration 10/03/2020  . Leukocytosis 10/03/2020  . Thrombocytosis 10/03/2020  . Hyponatremia 10/03/2020  . Hypochloremia 10/03/2020  . Failure to thrive in adult 10/03/2020  . Acute renal failure superimposed on stage 3b chronic kidney disease ([place]) 10/03/2020  . Uncontrolled type 2 diabetes mellitus with hyperglycemia, without long-term current use of insulin ([place]) 10/03/2020  . Acute kidney injury superimposed on CKD ([place]) 10/02/2020  . Chronic kidney disease 10/01/2020  . 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 Chronic insomnia 01/08/2020  . Ocular myasthenia gravis ([place]) 09/18/2019  . Peripheral edema 01/23/2019  . Asthma 05/14/2015  . Heel spur 02/24/2015  . Depression 02/24/2015  . Post-menopausal bleeding 06/02/2014  . OA (osteoarthritis) of knee 04/09/2014  . Epidermal cyst 10/01/2013  . Class 3 obesity 02/25/2013  . Back pain 03/04/2012  . Gout 01/25/2012  . Edema 01/28/2011  . Obesity hypoventilation syndrome ([place]) 06/16/2008  . OBSTRUCTIVE SLEEP APNEA 05/08/2008  . Hyperlipidemia 08/28/2006  . Anxiety 08/28/2006  . Essential hypertension 08/28/2006  . GERD 08/28/2006  . Osteoarthritis 08/28/2006    11:02 AM, 10/23/20 Mearl Latin PT, DPT Physical Therapist at [place], [place], [address] Phone: [phone]   Fax:  [phone]  Name: [name] MRN: [id number] Date of Birth: [birth year]

## 2020-10-26 ENCOUNTER — Ambulatory Visit: Payer: Medicare Other | Admitting: *Deleted

## 2020-10-26 ENCOUNTER — Ambulatory Visit (HOSPITAL_COMMUNITY): Payer: Medicare Other | Admitting: Physical Therapy

## 2020-10-26 ENCOUNTER — Other Ambulatory Visit: Payer: Self-pay

## 2020-10-26 DIAGNOSIS — R600 Localized edema: Secondary | ICD-10-CM | POA: Diagnosis not present

## 2020-10-26 DIAGNOSIS — R601 Generalized edema: Secondary | ICD-10-CM | POA: Diagnosis not present

## 2020-10-26 DIAGNOSIS — L02415 Cutaneous abscess of right lower limb: Secondary | ICD-10-CM | POA: Diagnosis not present

## 2020-10-26 DIAGNOSIS — R262 Difficulty in walking, not elsewhere classified: Secondary | ICD-10-CM | POA: Diagnosis not present

## 2020-10-26 DIAGNOSIS — I890 Lymphedema, not elsewhere classified: Secondary | ICD-10-CM | POA: Diagnosis not present

## 2020-10-26 NOTE — Therapy (Signed)
[place], [place], [address] Phone: [phone]   Fax:  [phone]  Wound Care Therapy  Patient Details  Name: [name] MRN: [id number] Date of Birth: [birth year] No data recorded  Encounter Date: 10/26/2020   PT End of Session - 10/26/20 1329    Visit Number 2    Number of Visits 18    Date for PT Re-Evaluation 12/04/20    Authorization Type UHC Medicare (no visit limit, no auth)    Progress Note Due on Visit 10    PT Start Time 1045    PT Stop Time 1145    PT Time Calculation (min) 60 min    Activity Tolerance Patient tolerated treatment well    Behavior During Therapy [facility] for tasks assessed/performed           Past Medical History:  Diagnosis Date  . Achilles tendinitis   . Anxiety   . Asthma   . Depression   . GERD (gastroesophageal reflux disease)   . Gout   . H/O myasthenia gravis    left eye  . HTN (hypertension)   . Hyperlipidemia   . Low back pain   . Obesity hypoventilation syndrome ([place])   . Obstructive sleep apnea   . Osteoarthritis     Past Surgical History:  Procedure Laterality Date  . CATARACT EXTRACTION W/PHACO Left 09/17/2015   Procedure: CATARACT EXTRACTION PHACO AND INTRAOCULAR LENS PLACEMENT LEFT EYE cde=8.58;  Surgeon: Tonny Branch, MD;  Location: [facility];  Service: Ophthalmology;  Laterality: Left;  . CATARACT EXTRACTION W/PHACO Right 05/15/2017   Procedure: CATARACT EXTRACTION PHACO AND INTRAOCULAR LENS PLACEMENT (IOC);  Surgeon: Tonny Branch, MD;  Location: [facility] ORS;  Service: Ophthalmology;  Laterality: Right;  CDE: 6.34  . COLONOSCOPY N/A 12/25/2013   Procedure: COLONOSCOPY;  Surgeon: Rogene Houston, MD;  Location: [facility];  Service: Endoscopy;  Laterality: N/A;  730-rescheduled Ann notified pt  . COLONOSCOPY WITH PROPOFOL N/A 10/04/2019   Procedure: COLONOSCOPY WITH PROPOFOL;  Surgeon: Rogene Houston, MD;  Location: [facility];  Service: Endoscopy;   Laterality: N/A;  815  . CYST EXCISION N/A 09/12/2016   Procedure: EXCISION SEBACEOUS CYST, BACK;  Surgeon: Aviva Signs, MD;  Location: [facility];  Service: General;  Laterality: N/A;  . INTRAOCULAR LENS INSERTION Bilateral 2018   Dr. Tonny Branch   . POLYPECTOMY  10/04/2019   Procedure: POLYPECTOMY;  Surgeon: Rogene Houston, MD;  Location: [facility];  Service: Endoscopy;;  . TUBAL LIGATION      There were no vitals filed for this visit.    Subjective Assessment - 10/26/20 1156    Subjective Pt states that her right leg is still bothing he quite a bit.  Once therapist explains what lymphedema is and that once her wounds are healed she will need to wear compression all the time pt states that she is not doing that.   Therapist explained that there are donners that can be used to asssit and decrease the burden on donning compression garments therapist states that she has a butler at home and can not use it.    Pertinent History chronic edema in LE with wounds causing hospitalization, ( most likely lymphedema), gout, LBP, DM,    Limitations House hold activities;Walking    Currently in Pain? Yes    Pain Score 8     Pain Location Leg    Pain Orientation Right    Pain  Descriptors / Indicators Sore;Aching    Pain Type Chronic pain    Pain Onset 1 to 4 weeks ago    Pain Frequency Constant    Aggravating Factors  dependent postition    Pain Relieving Factors elevation    Effect of Pain on Daily Activities limites                     Wound Therapy - 10/26/20 1156    Subjective see above    Patient and Family Stated Goals wounds to heal    Date of Onset 07/11/21    Prior Treatments unna boots, hospitalizations    Wound Properties Date First Assessed: 10/26/20 Time First Assessed: 1050 Location: Pretibial Location Orientation: Right Present on Admission: Yes   Dressing Type Compression wrap    Dressing Changed Changed    Dressing Status Old drainage    Dressing Change  Frequency PRN    Site / Wound Assessment Dusky    % Wound base Yellow/Fibrinous Exudate 100%    Peri-wound Assessment Edema    Wound Length (cm) 0.2 cm    Wound Width (cm) 0.2 cm    Wound Surface Area (cm^2) 0.04 cm^2    Drainage Amount Moderate    Drainage Description Serous    Treatment Cleansed   manual techniques to improve edema   Wound Properties Date First Assessed: 10/26/20 Time First Assessed: 1100 Location: Tibial Location Orientation: Posterior;Right Present on Admission: Yes   Dressing Type Alginate;Compression wrap    Dressing Changed Changed    Dressing Status Old drainage    Dressing Change Frequency PRN    Site / Wound Assessment Granulation tissue;Bleeding    % Wound base Red or Granulating 70%    % Wound base Yellow/Fibrinous Exudate 30%    Wound Length (cm) 4 cm    Wound Width (cm) 4 cm    Wound Surface Area (cm^2) 16 cm^2    Drainage Amount Moderate    Drainage Description Serosanguineous    Treatment Cleansed   manual technique to decrease edema   Wound Properties Wound Type: Incision - Open Location: Pretibial Location Orientation: Distal;Right Present on Admission: Yes   Dressing Type Compression wrap;Alginate    Dressing Changed Changed    Dressing Status Old drainage    Dressing Change Frequency PRN    Site / Wound Assessment Dusky    % Wound base Red or Granulating 0%    % Wound base Yellow/Fibrinous Exudate 100%    Peri-wound Assessment Edema    Wound Length (cm) 1.8 cm    Wound Width (cm) 0.3 cm    Wound Surface Area (cm^2) 0.54 cm^2    Drainage Amount Copious    Treatment Cleansed   manual techniques to decrease ecema   Wound Therapy - Clinical Statement Therapist explained to pt that she most likely has lymphedema and would need to wear compression garments daily following treatment to keep her swelling down. PT stated that she was not going to do that as they are to difficult to get on and to tight.  Therapist explained that there are things that  the pt can use to facilitate donning; pt states that she already has a Butler at home and it is to difficulty to use.  Due to pt resistance to post treatment self care we will not complete lymphedema treatment, rather wound care.  PT has not wounds on LT LE , profore changed and will not need to be changed again until next  Monday 4/11.   Rt anterior wounds .  Rt anterior wounds are draining copiously with extra alginate used to for absorption.  Posterior wound was slightly dry and pulled with removal of dressing therefore xeroform placed over wound.    Wound Therapy - Functional Problem List unable to fit in shoes, unable to dress due to drainage of Rt LE    Factors Delaying/Impairing Wound Healing Altered sensation;Diabetes Mellitus;Infection - systemic/local;Polypharmacy    Wound Therapy - Frequency 3X / week    Wound Therapy - Current Recommendations PT    Wound Plan continue with wound care to RT LE; Lt LE to recieve compression dressing once a week.  Continue to explain to pt why wearing compression following therapy is to important.  Possible cutting of foam to use with profore for improved compression    Dressing  Lt LE profore compression dressing    Dressing Rt : alginate anteriorly, xeroform posteriorly f/b profore.                     PT Short Term Goals - 10/26/20 1330      PT SHORT TERM GOAL #1   Title Patient will be independent in self management strategies in order to reduce the risk of wound reoccurance.    Time 3    Period Weeks    Status On-going    Target Date 11/13/20             PT Long Term Goals - 10/26/20 1331      PT LONG TERM GOAL #1   Title Patient will be able to don/doff compression garments independently in order to manage LE edema.    Time 6    Period Weeks    Status On-going      PT LONG TERM GOAL #2   Title PT to understand and verbalize that she needs to wear her compression garment and pump on a daily basis at D/C or edema will return     Time 6    Period Weeks    Status On-going      PT LONG TERM GOAL #3   Title Patient will be able to wear her shoes again for improved ambulation ability.    Time 6    Period Weeks    Status On-going                 Plan - 10/26/20 1329    Clinical Impression Statement see above    Personal Factors and Comorbidities Fitness;Behavior Pattern;Past/Current Experience;Comorbidity 3+;Time since onset of injury/illness/exacerbation;Transportation    Comorbidities lymphedema, HTN, obesity    Examination-Activity Limitations Bathing;Locomotion Level;Transfers;Stairs;Stand;Dressing;Hygiene/Grooming    Examination-Participation Restrictions Meal Prep;Community Activity;Volunteer;Yard Work;Shop;Laundry;Cleaning    Stability/Clinical Decision Making Evolving/Moderate complexity    Rehab Potential Good    PT Frequency 3x / week    PT Duration 6 weeks    PT Treatment/Interventions ADLs/Self Care Home Management;Manual techniques;Manual lymph drainage;Compression bandaging;Gait training;Stair training;Functional mobility training;Therapeutic activities;Therapeutic exercise;Balance training;Neuromuscular re-education;Vasopneumatic Device;Taping;Splinting;Energy conservation;Passive range of motion;Scar mobilization;Patient/family education;DME Instruction    PT Next Visit Plan Go over lymphedema exercises to improve lymphatic cirulation, lymphedema management, wound care    PT Home Exercise Plan ankle pumps, elevation    Consulted and Agree with Plan of Care Patient           Patient will benefit from skilled therapeutic intervention in order to improve the following deficits and impairments:  Abnormal gait,Cardiopulmonary status limiting activity,Decreased endurance,Decreased activity tolerance,Decreased skin integrity,Pain,Increased edema,Decreased mobility  Visit Diagnosis: Abscess of right lower extremity excluding foot  Difficulty in walking, not elsewhere classified  Bilateral  lower extremity edema     Problem List Patient Active Problem List   Diagnosis Date Noted  . Abscess of right lower extremity excluding foot 10/18/2020  . Abscess of right leg 10/18/2020  . Dehydration 10/03/2020  . Leukocytosis 10/03/2020  . Thrombocytosis 10/03/2020  . Hyponatremia 10/03/2020  . Hypochloremia 10/03/2020  . Failure to thrive in adult 10/03/2020  . Acute renal failure superimposed on stage 3b chronic kidney disease ([place]) 10/03/2020  . Uncontrolled type 2 diabetes mellitus with hyperglycemia, without long-term current use of insulin ([place]) 10/03/2020  . Acute kidney injury superimposed on CKD ([place]) 10/02/2020  . Chronic kidney disease 10/01/2020  . 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[place]) 05/12/2020  . Chronic insomnia 01/08/2020  . Ocular myasthenia gravis ([place]) 09/18/2019  . Peripheral edema 01/23/2019  . Asthma 05/14/2015  . Heel spur 02/24/2015  . Depression 02/24/2015  . Post-menopausal bleeding 06/02/2014  . OA (osteoarthritis) of knee 04/09/2014  . Epidermal cyst 10/01/2013  . Class 3 obesity 02/25/2013  . Back pain 03/04/2012  . Gout 01/25/2012  . Edema 01/28/2011  . Obesity hypoventilation syndrome ([place]) 06/16/2008  . OBSTRUCTIVE SLEEP APNEA 05/08/2008  . Hyperlipidemia 08/28/2006  . Anxiety 08/28/2006  . Essential hypertension 08/28/2006  . GERD 08/28/2006  . Osteoarthritis 08/28/2006  Rayetta Humphrey, PT CLT [phone] 10/26/2020, 1:32 PM  [place] [address] [place], [place], [address] Phone: [phone]   Fax:  [phone]  Name: [name] MRN: [id number] Date of Birth: 21-Jan-1951

## 2020-10-26 NOTE — Chronic Care Management (AMB) (Signed)
   10/26/2020  [name] 01/17/1951 [id number]   Chronic Care Management   Outreach Note  10/26/2020 Name: [name] MRN: [id number] DOB: 09/16/1950  Referred by: Pcp, No Reason for referral : Chronic Care Management (DM, HTN)   Successful contact was made with the patient to discuss care management and care coordination services. RN care manager noted during conversation with patient that she does not have primary care provider.  Her provider Dr. Vic Blackbird left the practice and per Anderson Malta at [place] all of Dr. Dorian Heckle patients will need to go elsewhere to obtain a new provider due to remaining providers at the office are full.  Ms. Lamos is aware of this and states [facility] is working with her and her daughter on finding a new primary care provider.  RN care manager consulted with Janalyn Shy RN about this case and instructed to send email to [place] [facility] with [facility] to have patient transferred to [facility] central team.  Jacqlyn Larsen [facility], BSN RN Case Manager [place] [phone]  Follow Up Plan: No further follow up required: patient transferred to [facility] central team

## 2020-10-28 ENCOUNTER — Ambulatory Visit (HOSPITAL_COMMUNITY): Payer: Medicare Other

## 2020-10-29 ENCOUNTER — Other Ambulatory Visit: Payer: Self-pay

## 2020-10-29 ENCOUNTER — Encounter (HOSPITAL_COMMUNITY): Payer: Self-pay | Admitting: Physical Therapy

## 2020-10-29 ENCOUNTER — Ambulatory Visit (HOSPITAL_COMMUNITY): Payer: Medicare Other | Admitting: Physical Therapy

## 2020-10-29 DIAGNOSIS — L02415 Cutaneous abscess of right lower limb: Secondary | ICD-10-CM | POA: Diagnosis not present

## 2020-10-29 DIAGNOSIS — R600 Localized edema: Secondary | ICD-10-CM | POA: Diagnosis not present

## 2020-10-29 DIAGNOSIS — I890 Lymphedema, not elsewhere classified: Secondary | ICD-10-CM

## 2020-10-29 DIAGNOSIS — R262 Difficulty in walking, not elsewhere classified: Secondary | ICD-10-CM | POA: Diagnosis not present

## 2020-10-29 DIAGNOSIS — R601 Generalized edema: Secondary | ICD-10-CM | POA: Diagnosis not present

## 2020-10-29 NOTE — Therapy (Signed)
[place], [place], [address] Phone: [phone]   Fax:  [phone]  Wound Care Therapy  Patient Details  Name: [name] MRN: [id number] Date of Birth: 08-29-1950 No data recorded  Encounter Date: 10/29/2020   PT End of Session - 10/29/20 1713    Visit Number 3    Number of Visits 18    Date for PT Re-Evaluation 12/04/20    Authorization Type UHC Medicare (no visit limit, no auth)    Progress Note Due on Visit 10    PT Start Time 1450    PT Stop Time 1630    PT Time Calculation (min) 100 min    Activity Tolerance Patient tolerated treatment well    Behavior During Therapy [facility] for tasks assessed/performed           Past Medical History:  Diagnosis Date  . Achilles tendinitis   . Anxiety   . Asthma   . Depression   . GERD (gastroesophageal reflux disease)   . Gout   . H/O myasthenia gravis    left eye  . HTN (hypertension)   . Hyperlipidemia   . Low back pain   . Obesity hypoventilation syndrome ([place])   . Obstructive sleep apnea   . Osteoarthritis     Past Surgical History:  Procedure Laterality Date  . CATARACT EXTRACTION W/PHACO Left 09/17/2015   Procedure: CATARACT EXTRACTION PHACO AND INTRAOCULAR LENS PLACEMENT LEFT EYE cde=8.58;  Surgeon: Tonny Branch, MD;  Location: [facility];  Service: Ophthalmology;  Laterality: Left;  . CATARACT EXTRACTION W/PHACO Right 05/15/2017   Procedure: CATARACT EXTRACTION PHACO AND INTRAOCULAR LENS PLACEMENT (IOC);  Surgeon: Tonny Branch, MD;  Location: [facility];  Service: Ophthalmology;  Laterality: Right;  CDE: 6.34  . COLONOSCOPY N/A 12/25/2013   Procedure: COLONOSCOPY;  Surgeon: Rogene Houston, MD;  Location: [facility];  Service: Endoscopy;  Laterality: N/A;  730-rescheduled Ann notified pt  . COLONOSCOPY WITH PROPOFOL N/A 10/04/2019   Procedure: COLONOSCOPY WITH PROPOFOL;  Surgeon: Rogene Houston, MD;  Location: [facility];  Service: Endoscopy;   Laterality: N/A;  815  . CYST EXCISION N/A 09/12/2016   Procedure: EXCISION SEBACEOUS CYST, BACK;  Surgeon: Aviva Signs, MD;  Location: [facility];  Service: General;  Laterality: N/A;  . INTRAOCULAR LENS INSERTION Bilateral 2018   Dr. Tonny Branch   . POLYPECTOMY  10/04/2019   Procedure: POLYPECTOMY;  Surgeon: Rogene Houston, MD;  Location: [facility];  Service: Endoscopy;;  . TUBAL LIGATION      There were no vitals filed for this visit.    Subjective Assessment - 10/29/20 1653    Subjective Pt has no complaints.  Therapist showed pt juxtafit to see if she was interested in obtaining one following therapy to keep edema in LE down states that she would prefer to go back to the compression garment.  Pt states compression wraps were to tight.  Therapist once again explained that the pt most likely has lymphedema and would benefit from total decongestive techniques.  Pt is agreeable.               LYMPHEDEMA/ONCOLOGY QUESTIONNAIRE - 10/29/20 0001      Right Lower Extremity Lymphedema   30 cm Proximal to Floor at Lateral Plantar Foot 42 cm    20 cm Proximal to Floor at Lateral Plantar Foot 33.5 1    10  cm Proximal to Floor at Lateral Malleoli 31.8 cm  Circumference of ankle/heel 36.7 cm.    5 cm Proximal to 1st MTP Joint 26.8 cm      Left Lower Extremity Lymphedema   30 cm Proximal to Floor at Lateral Plantar Foot 48.3 cm    20 cm Proximal to Floor at Lateral Plantar Foot 30.9 cm    10 cm Proximal to Floor at Lateral Malleoli 33.9 cm    Circumference of ankle/heel 38 cm.    5 cm Proximal to 1st MTP Joint 29.1 cm    Across MTP Joint 29 cm                Wound Therapy - 10/29/20 0001    Pain Score 8     Pain Type Chronic pain    Pain Location Leg    Pain Orientation Right;Left    Pain Descriptors / Indicators Aching    Wound Properties Date First Assessed: 10/26/20 Time First Assessed: 1100 Location: Tibial Location Orientation: Posterior;Right Present on Admission:  Yes Final Assessment Date: 10/29/20 Final Assessment Time: 1500   Wound Properties Date First Assessed: 10/26/20 Time First Assessed: 1050 Location: Pretibial Location Orientation: Right Present on Admission: Yes Final Assessment Date: 10/29/20 Final Assessment Time: 1500   Wound Properties Wound Type: Incision - Open Location: Pretibial Location Orientation: Distal;Right Present on Admission: Yes   Dressing Type Alginate;Compression wrap    Dressing Changed Changed    Dressing Status Old drainage    Dressing Change Frequency PRN    Site / Wound Assessment Granulation tissue    % Wound base Red or Granulating 70%    % Wound base Yellow/Fibrinous Exudate 30%    Peri-wound Assessment Edema    Drainage Amount Moderate    Drainage Description Serous    Treatment Cleansed   manual techniques used to decrease edema.  Added foam to profore to add compression   Wound Therapy - Clinical Statement Pt is willing to try lymphedema treatment and agrees that she will need to wear a compression garment at discharged.  Therapist showed pt Juxtafit but pt prefers compression garment.  At this time only able to put short stretch bandages on Lt due to Rt wound draining to much.  Pt will need to progress to thigh high as soon as she is comfortable with the idea. Therapist demonstrated and gave instructional sheet on LE exercises to increase lymphatic circulation    Wound Therapy - Functional Problem List unable to fit in shoes, unable to dress due to drainage of Rt LE    Factors Delaying/Impairing Wound Healing Altered sensation;Diabetes Mellitus;Infection - systemic/local;Polypharmacy    Wound Therapy - Frequency 3X / week    Wound Therapy - Current Recommendations PT    Wound Plan PT to continue with total decongestive techniqes and wound care.    Dressing  Lt: 1/2 " foam and multiple short stretch bandages.    Dressing Rt : alginate anteriorly, f/b gauze, 1/2 " foam and profore compression bandage.            [place] Adult PT Treatment/Exercise - 10/29/20 0001      Exercises   Exercises --   Sitting exercises to increase lymphatic circulation to include ankle pumps, LAQ, hip ab/adduction, marching, diaphraghmic breath and lymph squeeze.     Manual Therapy   Manual Therapy Manual Lymphatic Drainage (MLD);Compression Bandaging    Manual therapy comments completed seperate from all other aspects of treatment.    Manual Lymphatic Drainage (MLD) to include supraclavicular, deep and superfical abdominal, inguinal/axillary  anastomosis and B LE.  Done anteriorly only secondary to time restraints    Compression Bandaging Lt LE foam with multiple layer of short stretch bandages to knee level. Rt due to continuing drainage used foam and profore                    PT Short Term Goals - 10/29/20 1715      PT SHORT TERM GOAL #1   Title Patient will be independent in self management strategies in order to reduce the risk of wound reoccurance.    Time 3    Period Weeks    Status Achieved    Target Date 11/13/20             PT Long Term Goals - 10/29/20 1715      PT LONG TERM GOAL #1   Title Patient will be able to don/doff compression garments independently in order to manage LE edema.    Time 6    Period Weeks    Status On-going      PT LONG TERM GOAL #2   Title PT to understand and verbalize that she needs to wear her compression garment and pump on a daily basis at D/C or edema will return    Time 6    Period Weeks    Status Achieved      PT LONG TERM GOAL #3   Title Patient will be able to wear her shoes again for improved ambulation ability.    Time 6    Period Weeks    Status On-going      PT LONG TERM GOAL #4   Title PT to have lost 6-8 cm in LE; 4 cm in foot to reduce risk of cellulitis    Time 5    Period Weeks    Target Date 12/04/20                 Plan - 10/29/20 1714    Clinical Impression Statement see above    Personal Factors and Comorbidities  Fitness;Behavior Pattern;Past/Current Experience;Comorbidity 3+;Time since onset of injury/illness/exacerbation;Transportation    Comorbidities lymphedema, HTN, obesity    Examination-Activity Limitations Bathing;Locomotion Level;Transfers;Stairs;Stand;Dressing;Hygiene/Grooming    Examination-Participation Restrictions Meal Prep;Community Activity;Volunteer;Yard Work;Shop;Laundry;Cleaning    Stability/Clinical Decision Making Evolving/Moderate complexity    Rehab Potential Good    PT Frequency 3x / week    PT Duration 6 weeks    PT Treatment/Interventions ADLs/Self Care Home Management;Manual techniques;Manual lymph drainage;Compression bandaging;Gait training;Stair training;Functional mobility training;Therapeutic activities;Therapeutic exercise;Balance training;Neuromuscular re-education;Vasopneumatic Device;Taping;Splinting;Energy conservation;Passive range of motion;Scar mobilization;Patient/family education;DME Instruction    PT Next Visit Plan , lymphedema management, wound care    PT Home Exercise Plan ankle pumps, elevation; 4/7:  see above    Consulted and Agree with Plan of Care Patient           Patient will benefit from skilled therapeutic intervention in order to improve the following deficits and impairments:  Abnormal gait,Cardiopulmonary status limiting activity,Decreased endurance,Decreased activity tolerance,Decreased skin integrity,Pain,Increased edema,Decreased mobility  Visit Diagnosis: Abscess of right lower extremity excluding foot  Difficulty in walking, not elsewhere classified  Bilateral lower extremity edema  Lymphedema, not elsewhere classified     Problem List Patient Active Problem List   Diagnosis Date Noted  . Abscess of right lower extremity excluding foot 10/18/2020  . Abscess of right leg 10/18/2020  . Dehydration 10/03/2020  . Leukocytosis 10/03/2020  . Thrombocytosis 10/03/2020  . Hyponatremia 10/03/2020  . Hypochloremia 10/03/2020  .  Failure to thrive in adult 10/03/2020  . Acute renal failure superimposed on stage 3b chronic kidney disease ([place]) 10/03/2020  . Uncontrolled type 2 diabetes mellitus with hyperglycemia, without long-term current use of insulin ([place]) 10/03/2020  . Acute kidney injury superimposed on CKD ([place]) 10/02/2020  . Chronic kidney disease 10/01/2020  . 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[place]) 05/12/2020  . Chronic insomnia 01/08/2020  . Ocular myasthenia gravis ([place]) 09/18/2019  . Peripheral edema 01/23/2019  . Asthma 05/14/2015  . Heel spur 02/24/2015  . Depression 02/24/2015  . Post-menopausal bleeding 06/02/2014  . OA (osteoarthritis) of knee 04/09/2014  . Epidermal cyst 10/01/2013  . Class 3 obesity 02/25/2013  . Back pain 03/04/2012  . Gout 01/25/2012  . Edema 01/28/2011  . Obesity hypoventilation syndrome ([place]) 06/16/2008  . OBSTRUCTIVE SLEEP APNEA 05/08/2008  . Hyperlipidemia 08/28/2006  . Anxiety 08/28/2006  . Essential hypertension 08/28/2006  . GERD 08/28/2006  . Osteoarthritis 08/28/2006    Rayetta Humphrey, PT CLT [phone] 10/29/2020, 5:18 PM  [place] [address] [place], [place], [address] Phone: [phone]   Fax:  [phone]  Name: [name] MRN: [id number] Date of Birth: 1950-10-06

## 2020-11-02 ENCOUNTER — Ambulatory Visit (HOSPITAL_COMMUNITY): Payer: Medicare Other | Admitting: Physical Therapy

## 2020-11-02 ENCOUNTER — Other Ambulatory Visit: Payer: Self-pay | Admitting: *Deleted

## 2020-11-02 ENCOUNTER — Encounter (HOSPITAL_COMMUNITY): Payer: Self-pay | Admitting: Physical Therapy

## 2020-11-02 ENCOUNTER — Other Ambulatory Visit: Payer: Self-pay

## 2020-11-02 DIAGNOSIS — R600 Localized edema: Secondary | ICD-10-CM

## 2020-11-02 DIAGNOSIS — R262 Difficulty in walking, not elsewhere classified: Secondary | ICD-10-CM | POA: Diagnosis not present

## 2020-11-02 DIAGNOSIS — L02415 Cutaneous abscess of right lower limb: Secondary | ICD-10-CM

## 2020-11-02 DIAGNOSIS — I890 Lymphedema, not elsewhere classified: Secondary | ICD-10-CM | POA: Diagnosis not present

## 2020-11-02 DIAGNOSIS — R601 Generalized edema: Secondary | ICD-10-CM | POA: Diagnosis not present

## 2020-11-02 NOTE — Patient Outreach (Signed)
[facility]) Care Management  11/02/2020  [name] 06/11/1951 [id number]   Referral Date: 4/7 Referral Source: Insurance Referral Reason: Chronic disease management post hospital discharge Insurance: [place] attempt #1, unsuccessful, unable to leave voice message.   Plan: RN CM will send outreach letter and follow up within the next 3-4 business days.  Valente David, [place], MSN [place] [phone]

## 2020-11-02 NOTE — Therapy (Signed)
[place], [place], [address] Phone: [phone]   Fax:  [phone]  Wound Care Therapy  Patient Details  Name: [name] MRN: [id number] Date of Birth: 07/07/1951 No data recorded  Encounter Date: 11/02/2020   PT End of Session - 11/02/20 1615    Visit Number 4    Number of Visits 18    Date for PT Re-Evaluation 12/04/20    Authorization Type UHC Medicare (no visit limit, no auth)    Progress Note Due on Visit 10    PT Start Time 1445    PT Stop Time 1600    PT Time Calculation (min) 75 min    Activity Tolerance Patient tolerated treatment well    Behavior During Therapy [facility] for tasks assessed/performed           Past Medical History:  Diagnosis Date  . Achilles tendinitis   . Anxiety   . Asthma   . Depression   . GERD (gastroesophageal reflux disease)   . Gout   . H/O myasthenia gravis    left eye  . HTN (hypertension)   . Hyperlipidemia   . Low back pain   . Obesity hypoventilation syndrome ([place])   . Obstructive sleep apnea   . Osteoarthritis     Past Surgical History:  Procedure Laterality Date  . CATARACT EXTRACTION W/PHACO Left 09/17/2015   Procedure: CATARACT EXTRACTION PHACO AND INTRAOCULAR LENS PLACEMENT LEFT EYE cde=8.58;  Surgeon: Tonny Branch, MD;  Location: [facility];  Service: Ophthalmology;  Laterality: Left;  . CATARACT EXTRACTION W/PHACO Right 05/15/2017   Procedure: CATARACT EXTRACTION PHACO AND INTRAOCULAR LENS PLACEMENT (IOC);  Surgeon: Tonny Branch, MD;  Location: [facility] OR[facility];  Service: Ophthalmology;  Laterality: Right;  CDE: 6.34  . COLONOSCOPY N/A 12/25/2013   Procedure: COLONOSCOPY;  Surgeon: Rogene Houston, MD;  Location: [facility];  Service: Endoscopy;  Laterality: N/A;  730-rescheduled Ann notified pt  . COLONOSCOPY WITH PROPOFOL N/A 10/04/2019   Procedure: COLONOSCOPY WITH PROPOFOL;  Surgeon: Rogene Houston, MD;  Location: [facility];  Service: Endoscopy;   Laterality: N/A;  815  . CYST EXCISION N/A 09/12/2016   Procedure: EXCISION SEBACEOUS CYST, BACK;  Surgeon: Aviva Signs, MD;  Location: [facility];  Service: General;  Laterality: N/A;  . INTRAOCULAR LENS INSERTION Bilateral 2018   Dr. Tonny Branch   . POLYPECTOMY  10/04/2019   Procedure: POLYPECTOMY;  Surgeon: Rogene Houston, MD;  Location: [facility];  Service: Endoscopy;;  . TUBAL LIGATION      There were no vitals filed for this visit.    Subjective Assessment - 11/02/20 1607    Subjective Pt states that her pain is down some today.  States she recieved her pump and has been doing her exercises.  She has noted increased urination over the weekend. Pt has decided that she would like a prescription for justafit but currently has no MD to send the prescription to; she is to see a MD at [facility] clinic Wed to become her primary MD but is unsure of the name.    Pertinent History chronic edema in LE with wounds causing hospitalization, ( most likely lymphedema), gout, LBP, DM,    Limitations House hold activities;Walking    Pain Onset 1 to 4 weeks ago                     Wound Therapy - 11/02/20 0001  Subjective see above    Patient and Family Stated Goals wounds to heal    Date of Onset 07/11/21    Pain Score 6     Pain Type Chronic pain    Pain Location Leg    Pain Orientation Right;Left    Wound Properties Wound Type: Incision - Open Location: Pretibial Location Orientation: Distal;Right Present on Admission: Yes   Dressing Type Alginate;Compression wrap    Dressing Changed Changed    Dressing Status Old drainage    Dressing Change Frequency PRN    Site / Wound Assessment Granulation tissue    % Wound base Red or Granulating 80%   after debridement   % Wound base Yellow/Fibrinous Exudate 20%    Peri-wound Assessment Edema    Tunneling (cm) where drain was placed at least .5 cm    Drainage Amount Moderate    Drainage Description Serous    Wound Therapy - Clinical  Statement Posterior aspect of Rt leg where newly healed was very dry and started bleeding with removal of compression dressing therefore therapist placed xeroform here prior to dressing.  Now that edema is down it is noted that there is a hole wherer the drain was placed in her leg in the center of the wound that has at least a .5 cm depth, it wll be measured next visit.  Pt has improved comfort with foam.    Wound Therapy - Functional Problem List unable to fit in shoes, unable to dress due to drainage of Rt LE    Factors Delaying/Impairing Wound Healing Altered sensation;Diabetes Mellitus;Infection - systemic/local;Polypharmacy    Wound Therapy - Frequency 3X / week    Wound Therapy - Current Recommendations PT    Wound Plan PT to continue with total decongestive techniqes and wound care.    Dressing  Lt: 1/2 " foam and multiple short stretch bandages.    Dressing Rt : alginate anteriorly,xeroform to posterior wound  f/b gauze, 1/2 " foam and profore compression bandage.           [facility] Adult PT Treatment/Exercise - 11/02/20 0001      Exercises   Exercises --   Sitting exercises to increase lymphatic circulation to include ankle pumps, LAQ, hip ab/adduction, marching, diaphraghmic breath and lymph squeeze.     Manual Therapy   Manual Therapy Manual Lymphatic Drainage (MLD);Compression Bandaging    Manual therapy comments completed seperate from all other aspects of treatment.    Manual Lymphatic Drainage (MLD) to include supraclavicular, deep and superfical abdominal, inguinal/axillary anastomosis and B LE.  Done anteriorly , posteriorly done in sidelying position    Compression Bandaging Lt LE foam with multiple layer of short stretch bandages to knee level. Rt due to continuing drainage used foam and profore                    PT Short Term Goals - 10/29/20 1715      PT SHORT TERM GOAL #1   Title Patient will be independent in self management strategies in order to reduce the  risk of wound reoccurance.    Time 3    Period Weeks    Status Achieved    Target Date 11/13/20             PT Long Term Goals - 10/29/20 1715      PT LONG TERM GOAL #1   Title Patient will be able to don/doff compression garments independently in order to manage LE edema.  Time 6    Period Weeks    Status On-going      PT LONG TERM GOAL #2   Title PT to understand and verbalize that she needs to wear her compression garment and pump on a daily basis at D/C or edema will return    Time 6    Period Weeks    Status Achieved      PT LONG TERM GOAL #3   Title Patient will be able to wear her shoes again for improved ambulation ability.    Time 6    Period Weeks    Status On-going      PT LONG TERM GOAL #4   Title PT to have lost 6-8 cm in LE; 4 cm in foot to reduce risk of cellulitis    Time 5    Period Weeks    Target Date 12/04/20                 Plan - 11/02/20 1615    Clinical Impression Statement see above    Personal Factors and Comorbidities Fitness;Behavior Pattern;Past/Current Experience;Comorbidity 3+;Time since onset of injury/illness/exacerbation;Transportation    Comorbidities lymphedema, HTN, obesity    Examination-Activity Limitations Bathing;Locomotion Level;Transfers;Stairs;Stand;Dressing;Hygiene/Grooming    Examination-Participation Restrictions Meal Prep;Community Activity;Volunteer;Yard Work;Shop;Laundry;Cleaning    Stability/Clinical Decision Making Evolving/Moderate complexity    Rehab Potential Good    PT Frequency 3x / week    PT Duration 6 weeks    PT Treatment/Interventions ADLs/Self Care Home Management;Manual techniques;Manual lymph drainage;Compression bandaging;Gait training;Stair training;Functional mobility training;Therapeutic activities;Therapeutic exercise;Balance training;Neuromuscular re-education;Vasopneumatic Device;Taping;Splinting;Energy conservation;Passive range of motion;Scar mobilization;Patient/family education;DME  Instruction    PT Next Visit Plan , lymphedema management, wound care    PT Home Exercise Plan ankle pumps, elevation; 4/7:  see above    Consulted and Agree with Plan of Care Patient           Patient will benefit from skilled therapeutic intervention in order to improve the following deficits and impairments:  Abnormal gait,Cardiopulmonary status limiting activity,Decreased endurance,Decreased activity tolerance,Decreased skin integrity,Pain,Increased edema,Decreased mobility  Visit Diagnosis: Difficulty in walking, not elsewhere classified  Abscess of right lower extremity excluding foot  Bilateral lower extremity edema  Lymphedema, not elsewhere classified     Problem List Patient Active Problem List   Diagnosis Date Noted  . Abscess of right lower extremity excluding foot 10/18/2020  . Abscess of right leg 10/18/2020  . Dehydration 10/03/2020  . Leukocytosis 10/03/2020  . Thrombocytosis 10/03/2020  . Hyponatremia 10/03/2020  . Hypochloremia 10/03/2020  . Failure to thrive in adult 10/03/2020  . Acute renal failure superimposed on stage 3b chronic kidney disease ([place]) 10/03/2020  . Uncontrolled type 2 diabetes mellitus with hyperglycemia, without long-term current use of insulin ([place]) 10/03/2020  . Acute kidney injury superimposed on CKD ([place]) 10/02/2020  . Chronic kidney disease 10/01/2020  . 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[place]) 05/12/2020  . Chronic insomnia 01/08/2020  . Ocular myasthenia gravis ([place]) 09/18/2019  . Peripheral edema 01/23/2019  . Asthma 05/14/2015  . Heel spur 02/24/2015  . Depression 02/24/2015  . Post-menopausal bleeding 06/02/2014  . OA (osteoarthritis) of knee 04/09/2014  . Epidermal cyst 10/01/2013  . Class 3 obesity 02/25/2013  . Back pain 03/04/2012  . Gout 01/25/2012  . Edema 01/28/2011  . Obesity hypoventilation syndrome ([place]) 06/16/2008  . OBSTRUCTIVE SLEEP APNEA 05/08/2008  . Hyperlipidemia 08/28/2006  . Anxiety 08/28/2006   . Essential hypertension 08/28/2006  . GERD 08/28/2006  . Osteoarthritis 08/28/2006    Rayetta Humphrey,  PT CLT [phone] 11/02/2020, 4:17 PM  [place] [address] [place], [place], [address] Phone: [phone]   Fax:  [phone]  Name: [name] MRN: [id number] Date of Birth: 1951-03-23

## 2020-11-04 ENCOUNTER — Ambulatory Visit (HOSPITAL_COMMUNITY): Payer: Medicare Other | Admitting: Physical Therapy

## 2020-11-04 ENCOUNTER — Encounter (HOSPITAL_COMMUNITY): Payer: Self-pay | Admitting: Physical Therapy

## 2020-11-04 ENCOUNTER — Other Ambulatory Visit: Payer: Self-pay

## 2020-11-04 DIAGNOSIS — I890 Lymphedema, not elsewhere classified: Secondary | ICD-10-CM | POA: Diagnosis not present

## 2020-11-04 DIAGNOSIS — L02415 Cutaneous abscess of right lower limb: Secondary | ICD-10-CM | POA: Diagnosis not present

## 2020-11-04 DIAGNOSIS — R262 Difficulty in walking, not elsewhere classified: Secondary | ICD-10-CM

## 2020-11-04 DIAGNOSIS — R600 Localized edema: Secondary | ICD-10-CM

## 2020-11-04 DIAGNOSIS — R601 Generalized edema: Secondary | ICD-10-CM | POA: Diagnosis not present

## 2020-11-04 NOTE — Therapy (Signed)
[facility] [address] [place], [place], [address] Phone: [phone]   Fax:  [phone]  Physical Therapy Treatment  Patient Details  Name: [name] MRN: [id number] Date of Birth: 1950-09-10 No data recorded  Encounter Date: 11/04/2020   PT End of Session - 11/04/20 1218    Visit Number 5    Number of Visits 18    Date for PT Re-Evaluation 12/04/20    Authorization Type UHC Medicare (no visit limit, no auth)    Progress Note Due on Visit 10    PT Start Time 1055    PT Stop Time 1207    PT Time Calculation (min) 72 min    Activity Tolerance Patient tolerated treatment well    Behavior During Therapy [facility] for tasks assessed/performed           Past Medical History:  Diagnosis Date  . Achilles tendinitis   . Anxiety   . Asthma   . Depression   . GERD (gastroesophageal reflux disease)   . Gout   . H/O myasthenia gravis    left eye  . HTN (hypertension)   . Hyperlipidemia   . Low back pain   . Obesity hypoventilation syndrome ([place])   . Obstructive sleep apnea   . Osteoarthritis     Past Surgical History:  Procedure Laterality Date  . CATARACT EXTRACTION W/PHACO Left 09/17/2015   Procedure: CATARACT EXTRACTION PHACO AND INTRAOCULAR LENS PLACEMENT LEFT EYE cde=8.58;  Surgeon: Tonny Branch, MD;  Location: [facility];  Service: Ophthalmology;  Laterality: Left;  . CATARACT EXTRACTION W/PHACO Right 05/15/2017   Procedure: CATARACT EXTRACTION PHACO AND INTRAOCULAR LENS PLACEMENT (IOC);  Surgeon: Tonny Branch, MD;  Location: [facility];  Service: Ophthalmology;  Laterality: Right;  CDE: 6.34  . COLONOSCOPY N/A 12/25/2013   Procedure: COLONOSCOPY;  Surgeon: Rogene Houston, MD;  Location: [facility];  Service: Endoscopy;  Laterality: N/A;  730-rescheduled Ann notified pt  . COLONOSCOPY WITH PROPOFOL N/A 10/04/2019   Procedure: COLONOSCOPY WITH PROPOFOL;  Surgeon: Rogene Houston, MD;  Location: [facility];  Service: Endoscopy;   Laterality: N/A;  815  . CYST EXCISION N/A 09/12/2016   Procedure: EXCISION SEBACEOUS CYST, BACK;  Surgeon: Aviva Signs, MD;  Location: [facility];  Service: General;  Laterality: N/A;  . INTRAOCULAR LENS INSERTION Bilateral 2018   Dr. Tonny Branch   . POLYPECTOMY  10/04/2019   Procedure: POLYPECTOMY;  Surgeon: Rogene Houston, MD;  Location: [facility];  Service: Endoscopy;;  . TUBAL LIGATION      There were no vitals filed for this visit.   Subjective Assessment - 11/04/20 1058    Subjective Pt now states that she does not have an appointment with the [facility] clinic she is going up there to see if they will take her as her patient.  PT states that her feet are bothering her the most and her pain is up    Pertinent History chronic edema in LE with wounds causing hospitalization, ( most likely lymphedema), gout, LBP, DM,    Limitations House hold activities;Walking    Currently in Pain? Yes    Pain Score 10-Worst pain ever    Pain Location Foot    Pain Orientation Right;Left    Pain Type Chronic pain    Pain Onset 1 to 4 weeks ago    Pain Frequency Constant    Aggravating Factors  dependent  postition    Pain Relieving Factors elevation  Wound Therapy - 11/04/20 1058    Subjective see above    Patient and Family Stated Goals wounds to heal    Date of Onset 07/11/21    Wound Properties Wound Type: Incision - Open Location: Pretibial Location Orientation: Distal;Right Present on Admission: Yes   Dressing Type Alginate    Dressing Changed Changed    Dressing Status Old drainage    Dressing Change Frequency PRN    Site / Wound Assessment Granulation tissue   after debridement   % Wound base Red or Granulating 90%    % Wound base Yellow/Fibrinous Exudate 10%    Peri-wound Assessment Edema    Wound Length (cm) 1.8 cm   This wound was made when they inserted tube to drain her leg.   Wound Width (cm) 0.8 cm    Wound Depth (cm) 0.5 cm   wound depth is from 9-1  o'clock   Wound Volume (cm^3) 0.72 cm^3    Wound Surface Area (cm^2) 1.44 cm^2    Drainage Amount Moderate    Drainage Description Serous    Treatment Cleansed;Debridement (Selective)    Selective Debridement - Location wound site    Selective Debridement - Tools Used Forceps    Selective Debridement - Tissue Removed slough    Wound Therapy - Clinical Statement PT drainage is decreasing in her right LE but still moderate.  Depth of wound where drain was placed is not filling in as expected at this point but overall skin integrity is improving B.    Wound Therapy - Functional Problem List unable to fit in shoes, unable to dress due to drainage of Rt LE    Factors Delaying/Impairing Wound Healing Altered sensation;Diabetes Mellitus;Infection - systemic/local;Polypharmacy    Wound Therapy - Frequency 3X / week    Wound Therapy - Current Recommendations PT    Wound Plan measure edema next treatment. PT to continue with total decongestive techniqes and wound care.    Dressing  Lt: 1/2 " foam and multiple short stretch bandages.    Dressing Rt : alginate anteriorly,xeroform to posterior wound  f/b gauze, 1/2 " foam and profore compression bandage.            [facility] Adult PT Treatment/Exercise - 11/04/20 0001      Manual Therapy   Manual Therapy Manual Lymphatic Drainage (MLD);Compression Bandaging    Manual therapy comments completed seperate from all other aspects of treatment.    Manual Lymphatic Drainage (MLD) to include supraclavicular, deep and superfical abdominal, inguinal/axillary anastomosis and B LE.  Done anteriorly , posteriorly done in sidelying position    Compression Bandaging Lt LE foam with multiple layer of short stretch bandages to knee level. Rt due to continuing drainage used foam and profore                    PT Short Term Goals - 10/29/20 1715      PT SHORT TERM GOAL #1   Title Patient will be independent in self management strategies in order to reduce  the risk of wound reoccurance.    Time 3    Period Weeks    Status Achieved    Target Date 11/13/20             PT Long Term Goals - 10/29/20 1715      PT LONG TERM GOAL #1   Title Patient will be able to don/doff compression garments independently in order to manage LE edema.    Time  6    Period Weeks    Status On-going      PT LONG TERM GOAL #2   Title PT to understand and verbalize that she needs to wear her compression garment and pump on a daily basis at D/C or edema will return    Time 6    Period Weeks    Status Achieved      PT LONG TERM GOAL #3   Title Patient will be able to wear her shoes again for improved ambulation ability.    Time 6    Period Weeks    Status On-going      PT LONG TERM GOAL #4   Title PT to have lost 6-8 cm in LE; 4 cm in foot to reduce risk of cellulitis    Time 5    Period Weeks    Target Date 12/04/20                 Plan - 11/04/20 1218    Clinical Impression Statement see above    Personal Factors and Comorbidities Fitness;Behavior Pattern;Past/Current Experience;Comorbidity 3+;Time since onset of injury/illness/exacerbation;Transportation    Comorbidities lymphedema, HTN, obesity    Examination-Activity Limitations Bathing;Locomotion Level;Transfers;Stairs;Stand;Dressing;Hygiene/Grooming    Examination-Participation Restrictions Meal Prep;Community Activity;Volunteer;Yard Work;Shop;Laundry;Cleaning    Stability/Clinical Decision Making Evolving/Moderate complexity    Rehab Potential Good    PT Frequency 3x / week    PT Duration 6 weeks    PT Treatment/Interventions ADLs/Self Care Home Management;Manual techniques;Manual lymph drainage;Compression bandaging;Gait training;Stair training;Functional mobility training;Therapeutic activities;Therapeutic exercise;Balance training;Neuromuscular re-education;Vasopneumatic Device;Taping;Splinting;Energy conservation;Passive range of motion;Scar mobilization;Patient/family  education;DME Instruction    PT Next Visit Plan measure edema continue with  lymphedema management, wound care    PT Home Exercise Plan ankle pumps, elevation; 4/7:  see above    Consulted and Agree with Plan of Care Patient           Patient will benefit from skilled therapeutic intervention in order to improve the following deficits and impairments:  Abnormal gait,Cardiopulmonary status limiting activity,Decreased endurance,Decreased activity tolerance,Decreased skin integrity,Pain,Increased edema,Decreased mobility  Visit Diagnosis: Difficulty in walking, not elsewhere classified  Abscess of right lower extremity excluding foot  Bilateral lower extremity edema  Lymphedema, not elsewhere classified     Problem List Patient Active Problem List   Diagnosis Date Noted  . Abscess of right lower extremity excluding foot 10/18/2020  . Abscess of right leg 10/18/2020  . Dehydration 10/03/2020  . Leukocytosis 10/03/2020  . Thrombocytosis 10/03/2020  . Hyponatremia 10/03/2020  . Hypochloremia 10/03/2020  . Failure to thrive in adult 10/03/2020  . Acute renal failure superimposed on stage 3b chronic kidney disease ([place]) 10/03/2020  . Uncontrolled type 2 diabetes mellitus with hyperglycemia, without long-term current use of insulin ([place]) 10/03/2020  . Acute kidney injury superimposed on CKD ([place]) 10/02/2020  . Chronic kidney disease 10/01/2020  . 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[place]) 05/12/2020  . Chronic insomnia 01/08/2020  . Ocular myasthenia gravis ([place]) 09/18/2019  . Peripheral edema 01/23/2019  . Asthma 05/14/2015  . Heel spur 02/24/2015  . Depression 02/24/2015  . Post-menopausal bleeding 06/02/2014  . OA (osteoarthritis) of knee 04/09/2014  . Epidermal cyst 10/01/2013  . Class 3 obesity 02/25/2013  . Back pain 03/04/2012  . Gout 01/25/2012  . Edema 01/28/2011  . Obesity hypoventilation syndrome ([place]) 06/16/2008  . OBSTRUCTIVE SLEEP APNEA 05/08/2008  .  Hyperlipidemia 08/28/2006  . Anxiety 08/28/2006  . Essential hypertension 08/28/2006  . GERD 08/28/2006  . Osteoarthritis 08/28/2006  Rayetta Humphrey, PT CLT [phone] 11/04/2020, 12:20 PM  [facility] [address] [place], [place], [address] Phone: [phone]   Fax:  [phone]  Name: [name] MRN: [id number] Date of Birth: Jun 30, 1951

## 2020-11-05 ENCOUNTER — Encounter (HOSPITAL_COMMUNITY): Payer: Self-pay

## 2020-11-05 ENCOUNTER — Other Ambulatory Visit: Payer: Self-pay | Admitting: *Deleted

## 2020-11-05 ENCOUNTER — Ambulatory Visit (HOSPITAL_COMMUNITY): Payer: Medicare Other

## 2020-11-05 DIAGNOSIS — I890 Lymphedema, not elsewhere classified: Secondary | ICD-10-CM

## 2020-11-05 DIAGNOSIS — R262 Difficulty in walking, not elsewhere classified: Secondary | ICD-10-CM | POA: Diagnosis not present

## 2020-11-05 DIAGNOSIS — R600 Localized edema: Secondary | ICD-10-CM | POA: Diagnosis not present

## 2020-11-05 DIAGNOSIS — R601 Generalized edema: Secondary | ICD-10-CM | POA: Diagnosis not present

## 2020-11-05 DIAGNOSIS — L02415 Cutaneous abscess of right lower limb: Secondary | ICD-10-CM | POA: Diagnosis not present

## 2020-11-05 NOTE — Therapy (Signed)
[facility] [place], [place], [address] Phone: [phone]   Fax:  [phone]  Wound Care Therapy  Patient Details  Name: [name] MRN: [id number] Date of Birth: 1951/02/12 No data recorded  Encounter Date: 11/05/2020   PT End of Session - 11/05/20 1507    Visit Number 6    Number of Visits 18    Date for PT Re-Evaluation 12/04/20    Authorization Type [employer] Medicare (no visit limit, no auth)    Progress Note Due on Visit 10    PT Start Time 1317    PT Stop Time 1449    PT Time Calculation (min) 92 min    Activity Tolerance Patient tolerated treatment well    Behavior During Therapy [facility] for tasks assessed/performed           Past Medical History:  Diagnosis Date  . Achilles tendinitis   . Anxiety   . Asthma   . Depression   . GERD (gastroesophageal reflux disease)   . Gout   . H/O myasthenia gravis    left eye  . HTN (hypertension)   . Hyperlipidemia   . Low back pain   . Obesity hypoventilation syndrome ([place])   . Obstructive sleep apnea   . Osteoarthritis     Past Surgical History:  Procedure Laterality Date  . CATARACT EXTRACTION W/PHACO Left 09/17/2015   Procedure: CATARACT EXTRACTION PHACO AND INTRAOCULAR LENS PLACEMENT LEFT EYE cde=8.58;  Surgeon: Tonny Branch, MD;  Location: [facility];  Service: Ophthalmology;  Laterality: Left;  . CATARACT EXTRACTION W/PHACO Right 05/15/2017   Procedure: CATARACT EXTRACTION PHACO AND INTRAOCULAR LENS PLACEMENT (IOC);  Surgeon: Tonny Branch, MD;  Location: [facility];  Service: Ophthalmology;  Laterality: Right;  CDE: 6.34  . COLONOSCOPY N/A 12/25/2013   Procedure: COLONOSCOPY;  Surgeon: Rogene Houston, MD;  Location: [facility];  Service: Endoscopy;  Laterality: N/A;  730-rescheduled Ann notified pt  . COLONOSCOPY WITH PROPOFOL N/A 10/04/2019   Procedure: COLONOSCOPY WITH PROPOFOL;  Surgeon: Rogene Houston, MD;  Location: [facility];  Service: Endoscopy;   Laterality: N/A;  815  . CYST EXCISION N/A 09/12/2016   Procedure: EXCISION SEBACEOUS CYST, BACK;  Surgeon: Aviva Signs, MD;  Location: [facility];  Service: General;  Laterality: N/A;  . INTRAOCULAR LENS INSERTION Bilateral 2018   Dr. Tonny Branch   . POLYPECTOMY  10/04/2019   Procedure: POLYPECTOMY;  Surgeon: Rogene Houston, MD;  Location: [facility];  Service: Endoscopy;;  . TUBAL LIGATION      There were no vitals filed for this visit.    Subjective Assessment - 11/05/20 1458    Subjective Pt stated she received a call today with a lady named Monica who stated she was going to call next week and set up PCP.  Pt reports that her feet are bothering her today, pain scale 8/10 Bil feet.    Pertinent History chronic edema in LE with wounds causing hospitalization, ( most likely lymphedema), gout, LBP, DM,    Currently in Pain? Yes    Pain Score 8     Pain Location Foot    Pain Orientation Right;Left    Pain Descriptors / Indicators Aching    Pain Type Chronic pain    Pain Onset 1 to 4 weeks ago    Pain Frequency Constant    Aggravating Factors  dependent position    Pain Relieving Factors elevation    Effect of  Pain on Daily Activities limits               LYMPHEDEMA/ONCOLOGY QUESTIONNAIRE - 11/05/20 0001      Right Lower Extremity Lymphedema   30 cm Proximal to Floor at Lateral Plantar Foot 41 cm   10/29/20 was 42   20 cm Proximal to Floor at Lateral Plantar Foot 32.2 1   10/29/20 was 33.5   10 cm Proximal to Floor at Lateral Malleoli 2938 cm   10/29/20 was 31.8   Circumference of ankle/heel 35.7 cm.   10/29/20 was 36.7   5 cm Proximal to 1st MTP Joint 25.7 cm   10/29/20 was 26.8   Across MTP Joint 25.6 cm      Left Lower Extremity Lymphedema   30 cm Proximal to Floor at Lateral Plantar Foot 40.2 cm   10/29/20 was 48.3   20 cm Proximal to Floor at Lateral Plantar Foot 34.4 cm   10/29/20 was 30.9   10 cm Proximal to Floor at Lateral Malleoli 31.1 cm   10/29/20 was 33.9   Circumference  of ankle/heel 37.5 cm.   10/29/20 was 38   5 cm Proximal to 1st MTP Joint 27.7 cm   10/29/20 was 29.1   Across MTP Joint 26.8 cm   10/29/20 was 29               Wound Therapy - 11/05/20 1458    Subjective Pt stated she received a call today with a lady named [place] who stated she was going to call next week and set up PCP.  Pt reports that her feet are bothering her today, pain scale 8/10 Bil feet.    Patient and Family Stated Goals wounds to heal    Date of Onset 07/11/21    Prior Treatments unna boots, hospitalizations    Pain Scale 0-10    Evaluation and Treatment Procedures Explained to Patient/Family Yes    Evaluation and Treatment Procedures agreed to    Wound Properties Wound Type: Incision - Open Location: Pretibial Location Orientation: Distal;Right Present on Admission: Yes   Wound Image View All Images View Images    Dressing Type Alginate;Compression wrap   alginate, gauze, profore   Dressing Changed Changed    Dressing Status Old drainage    Dressing Change Frequency PRN    Site / Wound Assessment Granulation tissue    % Wound base Red or Granulating 90%    % Wound base Yellow/Fibrinous Exudate 10%    Peri-wound Assessment Edema    Wound Length (cm) 1.7 cm    Wound Width (cm) 0.8 cm    Wound Depth (cm) 0.5 cm    Wound Volume (cm^3) 0.68 cm^3    Wound Surface Area (cm^2) 1.36 cm^2    Drainage Amount Moderate    Drainage Description Serous    Treatment Cleansed;Debridement (Selective)    Selective Debridement - Location wound site    Selective Debridement - Tools Used Forceps    Selective Debridement - Tissue Removed slough    Wound Therapy - Clinical Statement Rt wound drainage continues to be copious with palpation all perimeters of wound.  Able to remove slough with 90% granulation, continues wiht alginate and profore compression wrap due to drainage.    Wound Therapy - Functional Problem List unable to fit in shoes, unable to dress due to drainage of Rt LE     Factors Delaying/Impairing Wound Healing Altered sensation;Diabetes Mellitus;Infection - systemic/local;Polypharmacy    Wound Therapy - Frequency  3X / week    Wound Therapy - Current Recommendations PT    Wound Plan measure edema next treatment Thursday/Friday. Pt to continue with total decongestive techniqes and wound care.    Dressing  Lt: 1/2 " foam and multiple short stretch bandages.    Dressing Rt : alginate anteriorly,xeroform to posterior wound  f/b gauze, 1/2 " foam and profore compression bandage.           [facility] Adult PT Treatment/Exercise - 11/05/20 0001      Manual Therapy   Manual Therapy Manual Lymphatic Drainage (MLD);Compression Bandaging;Other (comment)    Manual therapy comments completed seperate from all other aspects of treatment.    Manual Lymphatic Drainage (MLD) to include supraclavicular, deep and superfical abdominal, inguinal/axillary anastomosis and B LE.  Done anteriorly only due to time    Compression Bandaging Lt LE foam with multiple layer of short stretch bandages to knee level. Rt due to continuing drainage used foam and profore    Other Manual Therapy Measurement                    PT Short Term Goals - 10/29/20 1715      PT SHORT TERM GOAL #1   Title Patient will be independent in self management strategies in order to reduce the risk of wound reoccurance.    Time 3    Period Weeks    Status Achieved    Target Date 11/13/20             PT Long Term Goals - 10/29/20 1715      PT LONG TERM GOAL #1   Title Patient will be able to don/doff compression garments independently in order to manage LE edema.    Time 6    Period Weeks    Status On-going      PT LONG TERM GOAL #2   Title PT to understand and verbalize that she needs to wear her compression garment and pump on a daily basis at D/C or edema will return    Time 6    Period Weeks    Status Achieved      PT LONG TERM GOAL #3   Title Patient will be able to wear her shoes  again for improved ambulation ability.    Time 6    Period Weeks    Status On-going      PT LONG TERM GOAL #4   Title PT to have lost 6-8 cm in LE; 4 cm in foot to reduce risk of cellulitis    Time 5    Period Weeks    Target Date 12/04/20                 Plan - 11/05/20 1508    Clinical Impression Statement Measurements taken with noted reduction in volume BLE.  Rt continues to have moderate draininage though copious with palpation from all directions.  Manual decongestive techniques complete anterior only due to limited time wiht measurements.  Continued wiht profore compression wrap on Rt LE due to drainage and knee high multilayer short stretch bandages with 1/2in foam.    Personal Factors and Comorbidities Fitness;Behavior Pattern;Past/Current Experience;Comorbidity 3+;Time since onset of injury/illness/exacerbation;Transportation    Comorbidities lymphedema, HTN, obesity    Examination-Activity Limitations Bathing;Locomotion Level;Transfers;Stairs;Stand;Dressing;Hygiene/Grooming    Examination-Participation Restrictions Meal Prep;Community Activity;Volunteer;Yard Work;Shop;Laundry;Cleaning    Stability/Clinical Decision Making Evolving/Moderate complexity    Clinical Decision Making Moderate    Rehab Potential Good  PT Frequency 3x / week    PT Duration 6 weeks    PT Treatment/Interventions ADLs/Self Care Home Management;Manual techniques;Manual lymph drainage;Compression bandaging;Gait training;Stair training;Functional mobility training;Therapeutic activities;Therapeutic exercise;Balance training;Neuromuscular re-education;Vasopneumatic Device;Taping;Splinting;Energy conservation;Passive range of motion;Scar mobilization;Patient/family education;DME Instruction    PT Next Visit Plan measure edema on last treatment day of week.  Continue with  lymphedema management, wound care    PT Home Exercise Plan ankle pumps, elevation; 4/7:  see above    Consulted and Agree with Plan  of Care Patient           Patient will benefit from skilled therapeutic intervention in order to improve the following deficits and impairments:  Abnormal gait,Cardiopulmonary status limiting activity,Decreased endurance,Decreased activity tolerance,Decreased skin integrity,Pain,Increased edema,Decreased mobility  Visit Diagnosis: Difficulty in walking, not elsewhere classified  Abscess of right lower extremity excluding foot  Bilateral lower extremity edema  Lymphedema, not elsewhere classified     Problem List Patient Active Problem List   Diagnosis Date Noted  . Abscess of right lower extremity excluding foot 10/18/2020  . Abscess of right leg 10/18/2020  . Dehydration 10/03/2020  . Leukocytosis 10/03/2020  . Thrombocytosis 10/03/2020  . Hyponatremia 10/03/2020  . Hypochloremia 10/03/2020  . Failure to thrive in adult 10/03/2020  . Acute renal failure superimposed on stage 3b chronic kidney disease ([place]) 10/03/2020  . Uncontrolled type 2 diabetes mellitus with hyperglycemia, without long-term current use of insulin ([place]) 10/03/2020  . Acute kidney injury superimposed on CKD ([place]) 10/02/2020  . Chronic kidney disease 10/01/2020  . 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[place]) 05/12/2020  . Chronic insomnia 01/08/2020  . Ocular myasthenia gravis ([place]) 09/18/2019  . Peripheral edema 01/23/2019  . Asthma 05/14/2015  . Heel spur 02/24/2015  . Depression 02/24/2015  . Post-menopausal bleeding 06/02/2014  . OA (osteoarthritis) of knee 04/09/2014  . Epidermal cyst 10/01/2013  . Class 3 obesity 02/25/2013  . Back pain 03/04/2012  . Gout 01/25/2012  . Edema 01/28/2011  . Obesity hypoventilation syndrome ([place]) 06/16/2008  . OBSTRUCTIVE SLEEP APNEA 05/08/2008  . Hyperlipidemia 08/28/2006  . Anxiety 08/28/2006  . Essential hypertension 08/28/2006  . GERD 08/28/2006  . Osteoarthritis 08/28/2006   Ihor Austin, LPTA/CLT; CBIS [phone]  Aldona Lento 11/05/2020,  3:14 PM  [place], [place], [address] Phone: [phone]   Fax:  [phone]  Name: [name] MRN: [id number] Date of Birth: 27-Sep-1950

## 2020-11-05 NOTE — Patient Outreach (Addendum)
[facility]) Care Management  11/05/2020  [name] 09-Jan-1951 [id number]   Referral Date: 4/7 Referral Source: Insurance Referral Reason: Chronic disease management post hospital discharge Insurance: [employer] attempt #2, unsuccessful, unable to leave voice message.    Update:  Incoming call received back from member.  Identity verified.  This care manager introduced self and stated purpose of call.  [facility] care management services explained.    She report she has reached out to potential new PCP office Dr. Leslie Andrea in [place], waiting for call back with new patient appointment.  She has signed release of patient information from former PCP.  If member does proceed with Dr. Karie Kirks as new PCP, she will no longer be eligible for [facility] services.    Plan: RN CM will follow up within the next 3-4 business days to confirm new appointment.  If no appointment, will assist member with either scheduling office visit or finding new PCP.  Valente David, [place], MSN [place] [phone]

## 2020-11-08 ENCOUNTER — Other Ambulatory Visit: Payer: Self-pay

## 2020-11-08 ENCOUNTER — Emergency Department (HOSPITAL_COMMUNITY): Payer: Medicare Other

## 2020-11-08 ENCOUNTER — Emergency Department (HOSPITAL_COMMUNITY)
Admission: EM | Admit: 2020-11-08 | Discharge: 2020-11-08 | Disposition: A | Discharge status: Home / Self Care | Payer: Medicare Other | Attending: Emergency Medicine | Admitting: Emergency Medicine

## 2020-11-08 ENCOUNTER — Encounter (HOSPITAL_COMMUNITY): Payer: Self-pay | Admitting: *Deleted

## 2020-11-08 DIAGNOSIS — Z7982 Long term (current) use of aspirin: Secondary | ICD-10-CM | POA: Insufficient documentation

## 2020-11-08 DIAGNOSIS — I129 Hypertensive chronic kidney disease with stage 1 through stage 4 chronic kidney disease, or unspecified chronic kidney disease: Secondary | ICD-10-CM | POA: Diagnosis not present

## 2020-11-08 DIAGNOSIS — R600 Localized edema: Secondary | ICD-10-CM | POA: Insufficient documentation

## 2020-11-08 DIAGNOSIS — Z79899 Other long term (current) drug therapy: Secondary | ICD-10-CM | POA: Diagnosis not present

## 2020-11-08 DIAGNOSIS — K5900 Constipation, unspecified: Secondary | ICD-10-CM | POA: Insufficient documentation

## 2020-11-08 DIAGNOSIS — N1832 Chronic kidney disease, stage 3b: Secondary | ICD-10-CM | POA: Diagnosis not present

## 2020-11-08 DIAGNOSIS — R609 Edema, unspecified: Secondary | ICD-10-CM

## 2020-11-08 DIAGNOSIS — Z7984 Long term (current) use of oral hypoglycemic drugs: Secondary | ICD-10-CM | POA: Insufficient documentation

## 2020-11-08 DIAGNOSIS — J45909 Unspecified asthma, uncomplicated: Secondary | ICD-10-CM | POA: Diagnosis not present

## 2020-11-08 DIAGNOSIS — Z87891 Personal history of nicotine dependence: Secondary | ICD-10-CM | POA: Insufficient documentation

## 2020-11-08 DIAGNOSIS — E1122 Type 2 diabetes mellitus with diabetic chronic kidney disease: Secondary | ICD-10-CM | POA: Insufficient documentation

## 2020-11-08 LAB — CBC WITH DIFFERENTIAL/PLATELET
Abs Immature Granulocytes: 0.06 K/uL (ref 0.00–0.07)
Basophils Absolute: 0.1 K/uL (ref 0.0–0.1)
Basophils Relative: 1 %
Eosinophils Absolute: 0.3 K/uL (ref 0.0–0.5)
Eosinophils Relative: 3 %
HCT: 31.3 % — ABNORMAL LOW (ref 36.0–46.0)
Hemoglobin: 9.8 g/dL — ABNORMAL LOW (ref 12.0–15.0)
Immature Granulocytes: 1 %
Lymphocytes Relative: 27 %
Lymphs Abs: 2.2 K/uL (ref 0.7–4.0)
MCH: 30.1 pg (ref 26.0–34.0)
MCHC: 31.3 g/dL (ref 30.0–36.0)
MCV: 96 fL (ref 80.0–100.0)
Monocytes Absolute: 0.8 K/uL (ref 0.1–1.0)
Monocytes Relative: 10 %
Neutro Abs: 4.8 K/uL (ref 1.7–7.7)
Neutrophils Relative %: 58 %
Platelets: 249 K/uL (ref 150–400)
RBC: 3.26 MIL/uL — ABNORMAL LOW (ref 3.87–5.11)
RDW: 16.5 % — ABNORMAL HIGH (ref 11.5–15.5)
WBC: 8.2 K/uL (ref 4.0–10.5)
nRBC: 0 % (ref 0.0–0.2)

## 2020-11-08 LAB — COMPREHENSIVE METABOLIC PANEL
ALT: 14 U/L (ref 0–44)
AST: 18 U/L (ref 15–41)
Albumin: 2.7 g/dL — ABNORMAL LOW (ref 3.5–5.0)
Alkaline Phosphatase: 96 U/L (ref 38–126)
Anion gap: 8 (ref 5–15)
BUN: 10 mg/dL (ref 8–23)
CO2: 23 mmol/L (ref 22–32)
Calcium: 8.3 mg/dL — ABNORMAL LOW (ref 8.9–10.3)
Chloride: 106 mmol/L (ref 98–111)
Creatinine, Ser: 1.24 mg/dL — ABNORMAL HIGH (ref 0.44–1.00)
GFR, Estimated: 47 mL/min — ABNORMAL LOW (ref >60)
Glucose, Bld: 103 mg/dL — ABNORMAL HIGH (ref 70–99)
Potassium: 4.1 mmol/L (ref 3.5–5.1)
Sodium: 137 mmol/L (ref 135–145)
Total Bilirubin: 0.4 mg/dL (ref 0.3–1.2)
Total Protein: 5.8 g/dL — ABNORMAL LOW (ref 6.5–8.1)

## 2020-11-08 LAB — BRAIN NATRIURETIC PEPTIDE: B Natriuretic Peptide: 19 pg/mL (ref 0.0–100.0)

## 2020-11-08 LAB — CBG MONITORING, ED: Glucose-Capillary: 113 mg/dL — ABNORMAL HIGH (ref 70–99)

## 2020-11-08 MED ORDER — POLYETHYLENE GLYCOL 3350 17 GM/SCOOP PO POWD: ORAL | 0 refills | Status: AC

## 2020-11-08 NOTE — ED Provider Notes (Signed)
[facility] EMERGENCY DEPARTMENT Provider Note   CSN: [id number] Arrival date & time: 11/08/20  1709     History Chief Complaint  Patient presents with  . Edema    [name] is a 70 y.o. female with PMhx HTN, HLD, obesity hypoventilation syndrome, OSA, Diabetes, CKD, and history of peripheral edema/lymphedema who presents to the ED today with complaint of lateral upper extremity swelling that patient noticed this morning.  She has a history of bilateral lower extremity edema which she has been seen in the ED for in the past.  No history of CHF however she does take Lasix and denies missing any doses.  It has been deemed in the past that her peripheral edema is secondary to vascular insufficiency.  She currently follows with wound care for below of her lower extremities however states she has not followed up with vascular.  She complains of some pain to the arms however states that the swelling is mostly bothering her and her arms feel tight.  She also mentions that she has been constipated since Friday.  She states she typically has 1 or 2 bowel movements per day.  She states that she is trying to pass gas however is unsuccessful.  She has not tried anything at home for her constipation as she does not have anything at home for it.  She states that her daughter is busy living her own life and could not bring her any medication to help with the constipation however her daughter was able to bring her to the ED today. Previous abdominal surgeries include tubal ligation.  She denies any nausea, vomiting, abdominal pain, chest pain, shortness of breath, any other associated symptoms.  The history is provided by the patient and medical records.       Past Medical History:  Diagnosis Date  . Achilles tendinitis   . Anxiety   . Asthma   . Depression   . GERD (gastroesophageal reflux disease)   . Gout   . H/O myasthenia gravis    left eye  . HTN (hypertension)   . Hyperlipidemia   .  Low back pain   . Obesity hypoventilation syndrome ([place])   . Obstructive sleep apnea   . Osteoarthritis     Patient Active Problem List   Diagnosis Date Noted  . Abscess of right lower extremity excluding foot 10/18/2020  . Abscess of right leg 10/18/2020  . Dehydration 10/03/2020  . Leukocytosis 10/03/2020  . Thrombocytosis 10/03/2020  . Hyponatremia 10/03/2020  . Hypochloremia 10/03/2020  . Failure to thrive in adult 10/03/2020  . Acute renal failure superimposed on stage 3b chronic kidney disease ([place]) 10/03/2020  . Uncontrolled type 2 diabetes mellitus with hyperglycemia, without long-term current use of insulin ([place]) 10/03/2020  . Acute kidney injury superimposed on CKD ([place]) 10/02/2020  . Chronic kidney disease 10/01/2020  . 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 Chronic insomnia 01/08/2020  . Ocular myasthenia gravis ([place]) 09/18/2019  . Peripheral edema 01/23/2019  . Asthma 05/14/2015  . Heel spur 02/24/2015  . Depression 02/24/2015  . Post-menopausal bleeding 06/02/2014  . OA (osteoarthritis) of knee 04/09/2014  . Epidermal cyst 10/01/2013  . Class 3 obesity 02/25/2013  . Back pain 03/04/2012  . Gout 01/25/2012  . Edema 01/28/2011  . Obesity hypoventilation syndrome ([place]) 06/16/2008  . OBSTRUCTIVE SLEEP APNEA 05/08/2008  . Hyperlipidemia 08/28/2006  . Anxiety 08/28/2006  . Essential hypertension 08/28/2006  . GERD 08/28/2006  . Osteoarthritis 08/28/2006    Past  Surgical History:  Procedure Laterality Date  . CATARACT EXTRACTION W/PHACO Left 09/17/2015   Procedure: CATARACT EXTRACTION PHACO AND INTRAOCULAR LENS PLACEMENT LEFT EYE cde=8.58;  Surgeon: Tonny Branch, MD;  Location: [facility] OR[facility];  Service: Ophthalmology;  Laterality: Left;  . CATARACT EXTRACTION W/PHACO Right 05/15/2017   Procedure: CATARACT EXTRACTION PHACO AND INTRAOCULAR LENS PLACEMENT (IOC);  Surgeon: Tonny Branch, MD;  Location: [facility];  Service: Ophthalmology;  Laterality: Right;  CDE:  6.34  . COLONOSCOPY N/A 12/25/2013   Procedure: COLONOSCOPY;  Surgeon: Rogene Houston, MD;  Location: [facility] ENDO SUITE;  Service: Endoscopy;  Laterality: N/A;  730-rescheduled Ann notified pt  . COLONOSCOPY WITH PROPOFOL N/A 10/04/2019   Procedure: COLONOSCOPY WITH PROPOFOL;  Surgeon: Rogene Houston, MD;  Location: [facility] END[facility] SUITE;  Service: Endoscopy;  Laterality: N/A;  815  . CYST EXCISION N/A 09/12/2016   Procedure: EXCISION SEBACEOUS CYST, BACK;  Surgeon: Aviva Signs, MD;  Location: [facility];  Service: General;  Laterality: N/A;  . INTRAOCULAR LENS INSERTION Bilateral 2018   Dr. Tonny Branch   . POLYPECTOMY  10/04/2019   Procedure: POLYPECTOMY;  Surgeon: Rogene Houston, MD;  Location: [facility];  Service: Endoscopy;;  . TUBAL LIGATION       OB History    Gravida  3   Para  3   Term  3   Preterm      AB      Living  3     SAB      IAB      Ectopic      Multiple      Live Births              Family History  Problem Relation Age of Onset  . Heart disease Mother   . Thyroid disease Mother   . Heart failure Father   . Lung disease Father   . Diabetes Brother   . Crohn's disease Daughter     Social History   Tobacco Use  . Smoking status: Former Smoker    Packs/day: 2.00    Years: 36.00    Pack years: 72.00    Types: Cigarettes    Start date: 06/25/1969    Quit date: 03/25/2005    Years since quitting: 15.6  . Smokeless tobacco: Never Used  Vaping Use  . Vaping Use: Never used  Substance Use Topics  . Alcohol use: No    Alcohol/week: 0.0 standard drinks  . Drug use: No    Home Medications Prior to Admission medications   Medication Sig Start Date End Date Taking? Authorizing Provider  polyethylene glycol powder (MIRALAX) 17 GM/SCOOP powder Mix 1 scoop (17 grams) of powder in water daily to help with constipation. 11/08/20  Yes Jalaysia Lobb, PA-C  acetaminophen (TYLENOL) 500 MG tablet Take 1,000 mg by mouth every 6 (six) hours as needed for mild  pain.     [provider]  albuterol (VENTOLIN HFA) 108 (90 Base) MCG/ACT inhaler INHALE 2 PUFFS INTO THE LUNGS EVERY 4 HOURS AS NEEDED FOR WHEEZING OR SHORTNESS OF BREATH Patient taking differently: Inhale 2 puffs into the lungs every 4 (four) hours as needed for wheezing or shortness of breath. 01/23/20   Jay, Modena Nunnery, MD  allopurinol (ZYLOPRIM) 100 MG tablet TAKE 1 TABLET(100 MG) BY MOUTH DAILY Patient taking differently: Take 100 mg by mouth daily. 02/28/20   Alycia Rossetti, MD  aspirin EC 81 MG tablet Take 1 tablet (81 mg total) by mouth  every evening. 10/05/19   Rehman, Mechele Dawley, MD  atorvastatin (LIPITOR) 40 MG tablet TAKE 1 TABLET(40 MG) BY MOUTH DAILY AT 6 PM Patient taking differently: Take 40 mg by mouth daily. 08/31/20   Alycia Rossetti, MD  Blood Glucose Monitoring Suppl (BLOOD GLUCOSE SYSTEM PAK) KIT Use as directed to monitor FSBS 3x weekly. Dx: R73.09. 05/25/20   Alycia Rossetti, MD  busPIRone (BUSPAR) 5 MG tablet TAKE 1 TABLET(5 MG) BY MOUTH TWICE DAILY FOR ANXIETY Patient taking differently: Take 5 mg by mouth 2 (two) times daily. 10/16/20   Alycia Rossetti, MD  clotrimazole (LOTRIMIN) 1 % cream Apply 1 application topically 2 (two) times daily. 01/08/20   Alycia Rossetti, MD  diphenhydrAMINE (BENADRYL) 25 MG tablet Take 25 mg by mouth daily.    [provider]  fluticasone (CUTIVATE) 0.05 % cream APPLY EXTERNALLY TO THE AFFECTED AREA DAILY 08/13/18   Alycia Rossetti, MD  furosemide (LASIX) 40 MG tablet TAKE 1 TABLET(40 MG) BY MOUTH DAILY Patient taking differently: Take 40 mg by mouth daily. 09/08/20   [place], Modena Nunnery, MD  Glucose Blood (BLOOD GLUCOSE TEST STRIPS) STRP Use as directed to monitor FSBS 3x weekly. Dx: R73.09. 05/25/20   Alycia Rossetti, MD  HYDROcodone-acetaminophen (NORCO) 7.5-325 MG tablet Take 1 tablet by mouth 2 (two) times daily as needed for moderate pain. 09/30/20   Alycia Rossetti, MD  Lancets MISC Use as directed to monitor FSBS  3x weekly. Dx: R73.09. 05/25/20   Alycia Rossetti, MD  Magnesium 250 MG TABS Take 1 tablet (250 mg total) by mouth daily. Patient taking differently: Take 250 mg by mouth daily in the afternoon. Midday 08/16/18   [place], Modena Nunnery, MD  metFORMIN (GLUCOPHAGE) 500 MG tablet Take 1 tablet (500 mg total) by mouth 2 (two) times daily with a meal. 08/12/20   [place], Modena Nunnery, MD  nystatin (MYCOSTATIN/NYSTOP) powder Apply 1 application topically 3 (three) times daily. 01/08/20   [place], Modena Nunnery, MD  omeprazole (PRILOSEC) 40 MG capsule TAKE 1 CAPSULE(40 MG) BY MOUTH DAILY Patient taking differently: Take 40 mg by mouth daily. 06/29/20   Alycia Rossetti, MD  OXYGEN Inhale 1 L into the lungs at bedtime. IN ADDITION TO CPAP NIGHTLY    [provider]  potassium chloride (KLOR-CON) 10 MEQ tablet TAKE 1 TABLET BY MOUTH DAILY WITH FLUID PILL Patient taking differently: Take 10 mEq by mouth daily. With fluid pill 09/01/20   Alycia Rossetti, MD  predniSONE (DELTASONE) 10 MG tablet Pt currently on 42m daily, start taking 538mdaily for one month, then decrease to 4053maily for 1 month, then decrease 20m36mily. Patient taking differently: Take 30 mg by mouth daily. 07/16/20   Sater, RichNanine Means  pyridostigmine (MESTINON) 180 MG CR tablet Take 180 mg by mouth daily. 06/27/20   [provider]  traZODone (DESYREL) 100 MG tablet TAKE 1 TABLET BY MOUTH AT BEDTIME AS NEEDED FOR SLEEP Patient taking differently: Take 100 mg by mouth at bedtime as needed for sleep. 07/07/20   DurhAlycia Rossetti    Allergies    Patient has no known allergies.  Review of Systems   Review of Systems  Constitutional: Negative for chills and fever.  Gastrointestinal: Positive for constipation. Negative for abdominal pain, nausea and vomiting.  Musculoskeletal: Positive for joint swelling.  All other systems reviewed and are negative.   Physical Exam Updated Vital Signs BP 133/68 (BP Location: Left Arm)  Pulse 95   Temp 98.6 F (37 C) (Oral)   Resp 18   Ht 5' 4.5" (1.638 m)   Wt 122 kg   SpO2 96%   BMI 45.46 kg/m   Physical Exam Vitals and nursing note reviewed.  Constitutional:      Appearance: She is not ill-appearing.  HENT:     Head: Normocephalic and atraumatic.  Eyes:     Conjunctiva/sclera: Conjunctivae normal.  Cardiovascular:     Rate and Rhythm: Normal rate and regular rhythm.  Pulmonary:     Effort: Pulmonary effort is normal.     Breath sounds: Normal breath sounds. No wheezing, rhonchi or rales.  Abdominal:     General: Abdomen is flat. Bowel sounds are normal.     Palpations: Abdomen is soft.     Tenderness: There is no abdominal tenderness. There is no guarding or rebound.  Musculoskeletal:     Cervical back: Neck supple.     Right lower leg: Edema present.     Left lower leg: Edema present.     Comments: Nonpitting edema to BUEs. No obvious TTP. No erythema. ROM intact to shoulders, elbows, and wrists. 2+ radial pulses bilaterally.   Skin:    General: Skin is warm and dry.  Neurological:     Mental Status: She is alert.     ED Results / Procedures / Treatments   Labs (all labs ordered are listed, but only abnormal results are displayed) Labs Reviewed  CBG MONITORING, ED - Abnormal; Notable for the following components:      Result Value   Glucose-Capillary 113 (*)    All other components within normal limits  COMPREHENSIVE METABOLIC PANEL  CBC WITH DIFFERENTIAL/PLATELET  BRAIN NATRIURETIC PEPTIDE  URINALYSIS, ROUTINE W REFLEX MICROSCOPIC    EKG None  Radiology No results found.  Procedures Procedures   Medications Ordered in ED Medications - No data to display  ED Course  I have reviewed the triage vital signs and the nursing notes.  Pertinent labs & imaging results that were available during my care of the patient were reviewed by me and considered in my medical decision making (see chart for details).    MDM  Rules/Calculators/A&P                          70 year old female who presents to the ED today with complaint of bilateral upper extremity swelling that she noticed today as well as constipation for the past 2 days.  Patient has history of lymphedema in lower extremities and follows with wound care for same however states she has never had swelling in her arms before.  Denies any trauma to the arms.  She did have blood work done approximately 2 weeks ago when she was admitted to the hospital for abscess to right lower extremity did not start noticing swelling until today.  There are no signs of erythema or pain and given bilaterality I have very low suspicion for DVT. Patient endorses that she is on a "no salt diet" however then admits that she does not actually check the amount of sodium she is eating on a daily basis.  When asked what her diet consist of she reports collard greens and pancakes.  She denies any chest pain, shortness of breath.  On arrival to the ED vitals are stable.  She is afebrile, nontachycardic and nontachypneic.  It does not appear she has a history of CHF.  She is  on Lasix and states she has not missed any doses.  She has no abdominal tenderness palpation on exam today.  She does have active bowel sounds.  Does not seem that she has attempted to take anything for her constipation at this time.  Surgical history includes tubal ligation several years ago.  I have very low suspicion for small bowel obstruction at this time.  We will plan to work-up for swelling and constipation with lab work including CBC, CMP, BNP, acute abdominal series with chest as well as EKG.  I suspect her edema is related to the sodium in her diet as well as her known lymphedema.  She will need to follow back up with wound clinic for same.  At shift change case signed out to Sharyn Lull, PA-C, who will dispo patient accordingly. If work up negative pt is stable for discharge home and PCP follow up. Miralax has  been prescribed for the patient as I do not think she requires anything stronger at this time.   This note was prepared using Dragon voice recognition software and may include unintentional dictation errors due to the inherent limitations of voice recognition software.  Final Clinical Impression(s) / ED Diagnoses Final diagnoses:  Peripheral edema  Constipation, unspecified constipation type    Rx / DC Orders ED Discharge Orders         Ordered    polyethylene glycol powder (MIRALAX) 17 GM/SCOOP powder        11/08/20 1839           Eustaquio Maize, PA-C 11/08/20 1845    Milton Ferguson, MD 11/10/20 [phone]

## 2020-11-08 NOTE — Discharge Instructions (Addendum)
Please follow up with the wound clinic regarding your arm swelling as this is likely related to your lymphedema.   Decreasing the amount of salt in your diet and increasing your water intake will also help with swelling. Foods have natural sodium in them and some foods have much more sodium than others especially processed foods. Try to eat low sodium foods to help decrease the amount of salt in your diet.   Pick up Miralax and take as prescribed to help with constipation. You can take up to 4 capfuls of miralax per day until you have a good bowel movement. Then continue taking 1 scoop of miralax per day mixed in water to help soften your stools naturally. Increasing the amount of water in your diet can also help naturally soften your stools.   Please follow up with your PCP regarding your ED visit today  Return to the ED for any worsening symptoms

## 2020-11-08 NOTE — ED Provider Notes (Signed)
Care of the patient received from [facility].  Please see her note for full HPI.  In short, 70 year old female with known lymphedema presents to the ER with complaints of arm swelling and constipation.  Care of the patient signed out pending normal lab work.  Personally reviewed her lab work and imaging, her CBC is largely unremarkable, hemoglobin stable.  CMP with a baseline creatinine, no electrolyte abnormalities.  BNP is normal.  KUB without evidence of severe constipation or small bowel obstruction.  Patient was informed of the reassuring findings.  Patient was recommended to follow-up with her wound clinic for evaluation and management of her lymphedema in her arms.  Low suspicion for bilateral DVTs.  She was also given a prescription for MiraLAX.  Encouraged PCP follow-up.  Return precautions discussed.  She voiced understanding and is agreeable.   This was a shared visit with my supervising physician Dr. Roderic Palau who independently saw and evaluated the patient & provided guidance in evaluation/management/disposition ,in agreement with care  Results for orders placed or performed during the hospital encounter of 11/08/20  Comprehensive metabolic panel  Result Value Ref Range   Sodium 137 135 - 145 mmol/L   Potassium 4.1 3.5 - 5.1 mmol/L   Chloride 106 98 - 111 mmol/L   CO2 23 22 - 32 mmol/L   Glucose, Bld 103 (H) 70 - 99 mg/dL   BUN 10 8 - 23 mg/dL   Creatinine, Ser 1.24 (H) 0.44 - 1.00 mg/dL   Calcium 8.3 (L) 8.9 - 10.3 mg/dL   Total Protein 5.8 (L) 6.5 - 8.1 g/dL   Albumin 2.7 (L) 3.5 - 5.0 g/dL   AST 18 15 - 41 U/L   ALT 14 0 - 44 U/L   Alkaline Phosphatase 96 38 - 126 U/L   Total Bilirubin 0.4 0.3 - 1.2 mg/dL   GFR, Estimated 47 (L) >60 mL/min   Anion gap 8 5 - 15  CBC with Differential  Result Value Ref Range   WBC 8.2 4.0 - 10.5 K/uL   RBC 3.26 (L) 3.87 - 5.11 MIL/uL   Hemoglobin 9.8 (L) 12.0 - 15.0 g/dL   HCT 31.3 (L) 36.0 - 46.0 %   MCV 96.0 80.0 - 100.0 fL   MCH  30.1 26.0 - 34.0 pg   MCHC 31.3 30.0 - 36.0 g/dL   RDW 16.5 (H) 11.5 - 15.5 %   Platelets 249 150 - 400 K/uL   nRBC 0.0 0.0 - 0.2 %   Neutrophils Relative % 58 %   Neutro Abs 4.8 1.7 - 7.7 K/uL   Lymphocytes Relative 27 %   Lymphs Abs 2.2 0.7 - 4.0 K/uL   Monocytes Relative 10 %   Monocytes Absolute 0.8 0.1 - 1.0 K/uL   Eosinophils Relative 3 %   Eosinophils Absolute 0.3 0.0 - 0.5 K/uL   Basophils Relative 1 %   Basophils Absolute 0.1 0.0 - 0.1 K/uL   Immature Granulocytes 1 %   Abs Immature Granulocytes 0.06 0.00 - 0.07 K/uL  Brain natriuretic peptide  Result Value Ref Range   B Natriuretic Peptide 19.0 0.0 - 100.0 pg/mL  POC CBG, ED  Result Value Ref Range   Glucose-Capillary 113 (H) 70 - 99 mg/dL   CT Ankle Right Wo Contrast  Result Date: 10/18/2020 CLINICAL DATA:  Right ankle pain and swelling EXAM: CT OF THE RIGHT ANKLE WITHOUT CONTRAST TECHNIQUE: Multidetector CT imaging of the right ankle was performed according to the standard protocol. Multiplanar CT image  reconstructions were also generated. COMPARISON:  X-ray 07/11/2020 FINDINGS: Bones/Joint/Cartilage No acute fracture. No dislocation. Minimal degenerative dorsal spurring within the midfoot. Overall, joint spaces are preserved. Prominent bidirectional calcaneal enthesophytes. No tibiotalar or subtalar joint effusion. No bony erosion or periosteal elevation. Pes planus alignment. Ligaments Suboptimally assessed by CT. Muscles and Tendons Musculotendinous structures are grossly intact. No tenosynovial fluid collection is evident. Soft tissues Diffuse circumferential subcutaneous edema of the lower leg and foot. There is a large fluid collection within the anterior and medial superficial soft tissues of the lower leg extending distally to the level of the distal tibial metaphysis. Collection measures approximately 8.6 x 6.0 cm trans-axially and extends at least 6.5 cm in craniocaudal dimension, although the full superior extent of  the collection was not entirely included within the field of view. Internal density of the collection measures approximately 11 HU, suggestive of simple fluid. No gas is seen within the collection. IMPRESSION: 1. Large fluid collection within the anterior and medial superficial soft tissues of the right lower leg extending distally to the level of the distal tibial metaphysis, measuring at least 8.6 x 6.0 x 6.5 cm with simple fluid internal density. Appearance is nonspecific and could represent a evolving hematoma or seroma. Correlate for signs of infection to exclude possibility of an abscess. No gas within the collection. 2. Nonspecific circumferential subcutaneous edema of the lower leg and foot. 3. No acute osseous findings. Electronically Signed   By: Davina Poke D.O.   On: 10/18/2020 17:57   DG Abd Acute W/Chest  Result Date: 11/08/2020 CLINICAL DATA:  History of constipation EXAM: DG ABDOMEN ACUTE WITH 1 VIEW CHEST COMPARISON:  10/02/2020, 04/25/2006 FINDINGS: Cardiac shadow is stable. Lungs are well aerated bilaterally. No focal infiltrate or bony abnormality is seen. Scattered large and small bowel gas is noted. No free air is seen. No abnormal mass is noted. Globular calcification is seen in the pelvis stable from previous exams likely representing a calcified uterine fibroid. IMPRESSION: No acute abnormality noted in the chest or abdomen. Electronically Signed   By: Inez Catalina M.D.   On: 11/08/2020 19:09      Lyndel Safe 11/08/20 2038    Milton Ferguson, MD 11/10/20 [phone]

## 2020-11-08 NOTE — ED Triage Notes (Signed)
Pt c/o swelling to bilateral hands since this morning.  Also here for constipation.  LBM on Friday.  Has not taken anything for her constipation .

## 2020-11-10 ENCOUNTER — Ambulatory Visit (HOSPITAL_COMMUNITY): Payer: Medicare Other | Admitting: Physical Therapy

## 2020-11-10 ENCOUNTER — Other Ambulatory Visit: Payer: Self-pay

## 2020-11-10 DIAGNOSIS — L02415 Cutaneous abscess of right lower limb: Secondary | ICD-10-CM | POA: Diagnosis not present

## 2020-11-10 DIAGNOSIS — R601 Generalized edema: Secondary | ICD-10-CM | POA: Diagnosis not present

## 2020-11-10 DIAGNOSIS — R262 Difficulty in walking, not elsewhere classified: Secondary | ICD-10-CM

## 2020-11-10 DIAGNOSIS — I890 Lymphedema, not elsewhere classified: Secondary | ICD-10-CM

## 2020-11-10 DIAGNOSIS — R600 Localized edema: Secondary | ICD-10-CM | POA: Diagnosis not present

## 2020-11-10 NOTE — Therapy (Signed)
[place], [place], [address] Phone: [phone]   Fax:  [phone]  Wound Care Therapy  Patient Details  Name: [name] MRN: [id number] Date of Birth: 10/07/1950 No data recorded  Encounter Date: 11/10/2020   PT End of Session - 11/10/20 1445    Visit Number 7    Number of Visits 18    Date for PT Re-Evaluation 12/04/20    Authorization Type UHC Medicare (no visit limit, no auth)    Progress Note Due on Visit 10    PT Start Time 1321    PT Stop Time 1432    PT Time Calculation (min) 71 min    Activity Tolerance Patient tolerated treatment well    Behavior During Therapy [facility] for tasks assessed/performed           Past Medical History:  Diagnosis Date  . Achilles tendinitis   . Anxiety   . Asthma   . Depression   . GERD (gastroesophageal reflux disease)   . Gout   . H/O myasthenia gravis    left eye  . HTN (hypertension)   . Hyperlipidemia   . Low back pain   . Obesity hypoventilation syndrome ([place])   . Obstructive sleep apnea   . Osteoarthritis     Past Surgical History:  Procedure Laterality Date  . CATARACT EXTRACTION W/PHACO Left 09/17/2015   Procedure: CATARACT EXTRACTION PHACO AND INTRAOCULAR LENS PLACEMENT LEFT EYE cde=8.58;  Surgeon: Tonny Branch, MD;  Location: [facility];  Service: Ophthalmology;  Laterality: Left;  . CATARACT EXTRACTION W/PHACO Right 05/15/2017   Procedure: CATARACT EXTRACTION PHACO AND INTRAOCULAR LENS PLACEMENT (IOC);  Surgeon: Tonny Branch, MD;  Location: [facility];  Service: Ophthalmology;  Laterality: Right;  CDE: 6.34  . COLONOSCOPY N/A 12/25/2013   Procedure: COLONOSCOPY;  Surgeon: Rogene Houston, MD;  Location: [facility];  Service: Endoscopy;  Laterality: N/A;  730-rescheduled Ann notified pt  . COLONOSCOPY WITH PROPOFOL N/A 10/04/2019   Procedure: COLONOSCOPY WITH PROPOFOL;  Surgeon: Rogene Houston, MD;  Location: [facility];  Service: Endoscopy;   Laterality: N/A;  815  . CYST EXCISION N/A 09/12/2016   Procedure: EXCISION SEBACEOUS CYST, BACK;  Surgeon: Aviva Signs, MD;  Location: [facility];  Service: General;  Laterality: N/A;  . INTRAOCULAR LENS INSERTION Bilateral 2018   Dr. Tonny Branch   . POLYPECTOMY  10/04/2019   Procedure: POLYPECTOMY;  Surgeon: Rogene Houston, MD;  Location: [facility];  Service: Endoscopy;;  . TUBAL LIGATION      There were no vitals filed for this visit.    Subjective Assessment - 11/10/20 1437    Subjective Pt states that she is going to sign papers so that [facility] can request her records in attempt to get a PCP.  She continues to have swelling and pain in her feet and now feel that her arms are being affected    Pertinent History chronic edema in LE with wounds causing hospitalization, ( most likely lymphedema), gout, LBP, DM,    Currently in Pain? Yes    Pain Score 6     Pain Location Leg    Pain Orientation Right;Left    Pain Descriptors / Indicators Tightness;Throbbing    Pain Type Chronic pain    Pain Onset 1 to 4 weeks ago    Pain Frequency Intermittent  Wound Therapy - 11/10/20 1437    Wound Properties Wound Type: Incision - Open Location: Pretibial Location Orientation: Distal;Right Present on Admission: Yes   Dressing Type Alginate;Compression wrap    Dressing Changed Changed    Dressing Status Old drainage    Dressing Change Frequency PRN    Site / Wound Assessment Granulation tissue;Yellow    % Wound base Red or Granulating 95%    % Wound base Yellow/Fibrinous Exudate 5%    Peri-wound Assessment Edema    Drainage Amount Minimal    Drainage Description Serous    Treatment Cleansed;Debridement (Selective);Other (Comment)    Selective Debridement - Location wound site    Selective Debridement - Tools Used Forceps    Selective Debridement - Tissue Removed slough    Wound Therapy - Clinical Statement Noted decreased drainage from wound on  anterior aspect of Rt LE; posterior wound has healed completely now.  Skin integrity on a whole looks the best that it has.    Wound Therapy - Functional Problem List unable to fit in shoes, unable to dress due to drainage of Rt LE    Factors Delaying/Impairing Wound Healing Altered sensation;Diabetes Mellitus;Infection - systemic/local;Polypharmacy    Wound Therapy - Frequency 3X / week    Wound Therapy - Current Recommendations PT    Wound Plan Pt wound and circumferential measurements to be taken next time.  Give pt measurement and pamphlet to get compression garment from outlet center.  Pt will need a large buutler as well    Dressing  Lt: 1/2 " foam and multiple short stretch bandages.    Dressing RT alginate anteriorly f/b profore compression dressing with 1/2" foam           [facility] Adult PT Treatment/Exercise - 11/10/20 0001      Manual Therapy   Manual Therapy Manual Lymphatic Drainage (MLD);Compression Bandaging;Other (comment)    Manual therapy comments completed seperate from all other aspects of treatment.    Manual Lymphatic Drainage (MLD) to include supraclavicular, deep and superfical abdominal, inguinal/axillary anastomosis and B LE.  Done anteriorly only due to time    Compression Bandaging Lt LE foam with multiple layer of short stretch bandages to knee level. Rt due to continuing drainage used foam and profore                    PT Short Term Goals - 10/29/20 1715      PT SHORT TERM GOAL #1   Title Patient will be independent in self management strategies in order to reduce the risk of wound reoccurance.    Time 3    Period Weeks    Status Achieved    Target Date 11/13/20             PT Long Term Goals - 11/10/20 1446      PT LONG TERM GOAL #1   Title Patient will be able to don/doff compression garments independently in order to manage LE edema.    Time 6    Period Weeks    Status On-going      PT LONG TERM GOAL #2   Title PT to understand and  verbalize that she needs to wear her compression garment and pump on a daily basis at D/C or edema will return    Time 6    Period Weeks    Status Achieved      PT LONG TERM GOAL #3   Title Patient will be able to wear  her shoes again for improved ambulation ability.    Time 6    Period Weeks    Status On-going      PT LONG TERM GOAL #4   Title PT to have lost 6-8 cm in LE; 4 cm in foot to reduce risk of cellulitis    Time 5    Period Weeks                 Plan - 11/10/20 1445    Clinical Impression Statement see above    Personal Factors and Comorbidities Fitness;Behavior Pattern;Past/Current Experience;Comorbidity 3+;Time since onset of injury/illness/exacerbation;Transportation    Comorbidities lymphedema, HTN, obesity    Examination-Activity Limitations Bathing;Locomotion Level;Transfers;Stairs;Stand;Dressing;Hygiene/Grooming    Examination-Participation Restrictions Meal Prep;Community Activity;Volunteer;Yard Work;Shop;Laundry;Cleaning    Stability/Clinical Decision Making Evolving/Moderate complexity    Rehab Potential Good    PT Frequency 3x / week    PT Duration 6 weeks    PT Treatment/Interventions ADLs/Self Care Home Management;Manual techniques;Manual lymph drainage;Compression bandaging;Gait training;Stair training;Functional mobility training;Therapeutic activities;Therapeutic exercise;Balance training;Neuromuscular re-education;Vasopneumatic Device;Taping;Splinting;Energy conservation;Passive range of motion;Scar mobilization;Patient/family education;DME Instruction    PT Next Visit Plan Continue with  lymphedema management, wound care    PT Home Exercise Plan ankle pumps, elevation; 4/7:  see above    Consulted and Agree with Plan of Care Patient           Patient will benefit from skilled therapeutic intervention in order to improve the following deficits and impairments:  Abnormal gait,Cardiopulmonary status limiting activity,Decreased endurance,Decreased  activity tolerance,Decreased skin integrity,Pain,Increased edema,Decreased mobility  Visit Diagnosis: Difficulty in walking, not elsewhere classified  Abscess of right leg  Generalized edema  Lymphedema, not elsewhere classified     Problem List Patient Active Problem List   Diagnosis Date Noted  . Abscess of right lower extremity excluding foot 10/18/2020  . Abscess of right leg 10/18/2020  . Dehydration 10/03/2020  . Leukocytosis 10/03/2020  . Thrombocytosis 10/03/2020  . Hyponatremia 10/03/2020  . Hypochloremia 10/03/2020  . Failure to thrive in adult 10/03/2020  . Acute renal failure superimposed on stage 3b chronic kidney disease ([place]) 10/03/2020  . Uncontrolled type 2 diabetes mellitus with hyperglycemia, without long-term current use of insulin ([place]) 10/03/2020  . Acute kidney injury superimposed on CKD ([place]) 10/02/2020  . Chronic kidney disease 10/01/2020  . 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[place]) 05/12/2020  . Chronic insomnia 01/08/2020  . Ocular myasthenia gravis ([place]) 09/18/2019  . Peripheral edema 01/23/2019  . Asthma 05/14/2015  . Heel spur 02/24/2015  . Depression 02/24/2015  . Post-menopausal bleeding 06/02/2014  . OA (osteoarthritis) of knee 04/09/2014  . Epidermal cyst 10/01/2013  . Class 3 obesity 02/25/2013  . Back pain 03/04/2012  . Gout 01/25/2012  . Edema 01/28/2011  . Obesity hypoventilation syndrome ([place]) 06/16/2008  . OBSTRUCTIVE SLEEP APNEA 05/08/2008  . Hyperlipidemia 08/28/2006  . Anxiety 08/28/2006  . Essential hypertension 08/28/2006  . GERD 08/28/2006  . Osteoarthritis 08/28/2006     Rayetta Humphrey, PT CLT [phone] 11/10/2020, 2:48 PM  [place] [address] [place], [place], [address] Phone: [phone]   Fax:  [phone]  Name: [name] MRN: [id number] Date of Birth: 12-23-1950

## 2020-11-11 ENCOUNTER — Other Ambulatory Visit: Payer: Self-pay | Admitting: *Deleted

## 2020-11-11 NOTE — Patient Outreach (Signed)
[facility]) Care Management  11/11/2020  [name] 1950/10/19 [id number]   Call placed to member to follow up on securing new PCP.  No answer, unable to leave voice message.  Will follow up within the next 3-4 business days.  Valente David, [place], MSN [place] [phone]

## 2020-11-12 ENCOUNTER — Ambulatory Visit (HOSPITAL_COMMUNITY): Payer: Medicare Other | Admitting: Physical Therapy

## 2020-11-12 ENCOUNTER — Other Ambulatory Visit: Payer: Self-pay

## 2020-11-12 ENCOUNTER — Telehealth: Payer: Self-pay

## 2020-11-12 DIAGNOSIS — R262 Difficulty in walking, not elsewhere classified: Secondary | ICD-10-CM | POA: Diagnosis not present

## 2020-11-12 DIAGNOSIS — R601 Generalized edema: Secondary | ICD-10-CM

## 2020-11-12 DIAGNOSIS — I890 Lymphedema, not elsewhere classified: Secondary | ICD-10-CM

## 2020-11-12 DIAGNOSIS — R600 Localized edema: Secondary | ICD-10-CM | POA: Diagnosis not present

## 2020-11-12 DIAGNOSIS — L02415 Cutaneous abscess of right lower limb: Secondary | ICD-10-CM

## 2020-11-12 NOTE — Telephone Encounter (Signed)
Name: [name]  MRN: [id number] DOB: [birth year]  Patient has been enrolled into the transportation program.   [name] DOB: 01-14-1951 MRN: [id number]   RIDER WAIVER AND RELEASE OF LIABILITY  For purposes of improving physical access to our facilities, [facility] is pleased to partner with third parties to provide [facility] patients or other authorized individuals the option of convenient, on-demand ground transportation services (the [place]") through use of the technology service that enables users to request on-demand ground transportation from independent third-party providers.  By opting to use and accept these [employer], I, the undersigned, hereby agree on behalf of myself, and on behalf of any minor child using the [employer] for whom I am the parent or legal guardian, as follows:  1. Government social research officer provided to me are provided by independent third-party transportation providers who are not [employer] or employees and who are unaffiliated with [employer]. 2. [facility] is neither a transportation carrier nor a common or public carrier. 3. [facility] has no control over the quality or safety of the transportation that occurs as a result of the [employer]. 4. [facility] cannot guarantee that any third-party transportation provider will complete any arranged transportation service. 5. [facility] makes no representation, warranty, or guarantee regarding the reliability, timeliness, quality, safety, suitability, or availability of any of the Transport Services or that they will be error free. 6. I fully understand that traveling by vehicle involves risks and dangers of serious bodily injury, including permanent disability, paralysis, and death. I agree, on behalf of myself and on behalf of any minor child using the Transport Services for whom I am the parent or legal guardian, that the entire risk arising out of my use of the  [employer] remains solely with me, to the maximum extent permitted under applicable law. 7. The [employer] are provided "as is" and "as available." [facility] disclaims all representations and warranties, express, implied or statutory, not expressly set out in these terms, including the implied warranties of merchantability and fitness for a particular purpose. 8. I hereby waive and release [facility], its agents, employees, officers, directors, representatives, insurers, attorneys, assigns, successors, subsidiaries, and affiliates from any and all past, present, or future claims, demands, liabilities, actions, causes of action, or suits of any kind directly or indirectly arising from acceptance and use of the [employer]. 9. I further waive and release  and its affiliates from all present and future liability and responsibility for any injury or death to persons or damages to property caused by or related to the use of the [employer]. 10. I have read this Waiver and Release of Liability, and I understand the terms used in it and their legal significance. This Waiver is freely and voluntarily given with the understanding that my right (as well as the right of any minor child for whom I am the parent or legal guardian using the [employer]) to legal recourse against  in connection with the [employer] is knowingly surrendered in return for use of these services.   I attest that I read the consent document to Kathrynn Speed, gave Ms. Halley the opportunity to ask questions and answered the questions asked (if any). I affirm that Kathrynn Speed then provided consent for she's participation in this program.     Cameron Proud

## 2020-11-12 NOTE — Therapy (Signed)
[place], [place], [address] Phone: [phone]   Fax:  [phone]  Wound Care Therapy  Patient Details  Name: [name] MRN: [id number] Date of Birth: [birth year] No data recorded  Encounter Date: 11/12/2020 Progress Note Reporting Period 10/23/2020 to 11/12/2020  See note below for Objective Data and Assessment of Progress/Goals.       PT End of Session - 11/12/20 1302    Visit Number 8    Number of Visits 18    Date for PT Re-Evaluation 12/04/20    Authorization Type UHC Medicare (no visit limit, no auth)    Progress Note Due on Visit 18    PT Start Time 1315    PT Stop Time 1440    PT Time Calculation (min) 85 min    Activity Tolerance Patient tolerated treatment well    Behavior During Therapy WFL for tasks assessed/performed           Past Medical History:  Diagnosis Date  . Achilles tendinitis   . Anxiety   . Asthma   . Depression   . GERD (gastroesophageal reflux disease)   . Gout   . H/O myasthenia gravis    left eye  . HTN (hypertension)   . Hyperlipidemia   . Low back pain   . Obesity hypoventilation syndrome ([place])   . Obstructive sleep apnea   . Osteoarthritis     Past Surgical History:  Procedure Laterality Date  . CATARACT EXTRACTION W/PHACO Left 09/17/2015   Procedure: CATARACT EXTRACTION PHACO AND INTRAOCULAR LENS PLACEMENT LEFT EYE cde=8.58;  Surgeon: Tonny Branch, MD;  Location: [facility];  Service: Ophthalmology;  Laterality: Left;  . CATARACT EXTRACTION W/PHACO Right 05/15/2017   Procedure: CATARACT EXTRACTION PHACO AND INTRAOCULAR LENS PLACEMENT (IOC);  Surgeon: Tonny Branch, MD;  Location: [facility];  Service: Ophthalmology;  Laterality: Right;  CDE: 6.34  . COLONOSCOPY N/A 12/25/2013   Procedure: COLONOSCOPY;  Surgeon: Rogene Houston, MD;  Location[facility];  Service: Endoscopy;  Laterality: N/A;  730-rescheduled Ann notified pt  . COLONOSCOPY WITH PROPOFOL N/A  10/04/2019   Procedure: COLONOSCOPY WITH PROPOFOL;  Surgeon: Rogene Houston, MD;  [facility];  Service: Endoscopy;  Laterality: N/A;  815  . CYST EXCISION N/A 09/12/2016   Procedure: EXCISION SEBACEOUS CYST, BACK;  Surgeon: Aviva Signs, MD;  Location: [facility];  Service: General;  Laterality: N/A;  . INTRAOCULAR LENS INSERTION Bilateral 2018   Dr. Tonny Branch   . POLYPECTOMY  10/04/2019   Procedure: POLYPECTOMY;  Surgeon: Rogene Houston, MD;  Location: [facility];  Service: Endoscopy;;  . TUBAL LIGATION      There were no vitals filed for this visit.    Subjective Assessment - 11/12/20 1301    Subjective Pt has not complaints    Pertinent History chronic edema in LE with wounds causing hospitalization, ( most likely lymphedema), gout, LBP, DM,    Limitations House hold activities;Walking    Currently in Pain? Yes    Pain Location Leg    Pain Orientation Left;Right    Pain Descriptors / Indicators Tightness    Pain Type Chronic pain    Pain Onset 1 to 4 weeks ago               LYMPHEDEMA/ONCOLOGY QUESTIONNAIRE - 11/12/20 0001      Right Lower Extremity Lymphedema   30 cm Proximal to Floor at Lateral Plantar Foot 42  cm   was 48.5   20 cm Proximal to Floor at Lateral Plantar Foot 32.4 1   was 40.5 cm   10 cm Proximal to Floor at Lateral Malleoli 29.5 cm   was 30   Circumference of ankle/heel 35.7 cm.    5 cm Proximal to 1st MTP Joint 26 cm    Across MTP Joint 25.2 cm      Left Lower Extremity Lymphedema   30 cm Proximal to Floor at Lateral Plantar Foot 40 cm   was 50.5 cm   20 cm Proximal to Floor at Lateral Plantar Foot 34 cm   was 45   10 cm Proximal to Floor at Lateral Malleoli 30 cm   was 39 cm   Circumference of ankle/heel 37.7 cm.    5 cm Proximal to 1st MTP Joint 26.8 cm    Across MTP Joint 25 cm                Wound Therapy - 11/12/20 1301    Subjective Pt states the bandge on her Rt LE was to tight    Patient and Family Stated Goals  wounds to heal    Date of Onset 07/11/21    Prior Treatments unna boots, hospitalizations    Pain Scale 0-10    Pain Score 8    pt shows no signs of distress   Wound Properties Wound Type: Incision - Open Location: Pretibial Location Orientation: Distal;Right Present on Admission: Yes   Dressing Type Alginate;Compression wrap    Dressing Changed Changed    Dressing Status Old drainage    Dressing Change Frequency PRN    Site / Wound Assessment Granulation tissue;Yellow    % Wound base Red or Granulating 95%   initally 0%   % Wound base Yellow/Fibrinous Exudate 5%   initially 100%   Peri-wound Assessment Edema    Wound Length (cm) 1.4 cm   initially 1.8   Wound Width (cm) 0.5 cm   initally .3   Wound Depth (cm) --   initally unknown due to slought but later a tunnel noted superiorly   Wound Surface Area (cm^2) 0.7 cm^2    Drainage Amount Minimal   was copious   Drainage Description Serous    Treatment Cleansed;Other (Comment)   manual techniques   Wound Therapy - Clinical Statement PT edema has began to level out with minimal change from last measurement to today's measurement.  Good reduction from initial visit.  PT given information for elastic therapy she states that she already has a butler.    Wound Therapy - Functional Problem List unable to fit in shoes, unable to dress due to drainage of Rt LE    Factors Delaying/Impairing Wound Healing Altered sensation;Diabetes Mellitus;Infection - systemic/local;Polypharmacy    Wound Therapy - Frequency 3X / week    Wound Therapy - Current Recommendations PT    Wound Plan continue with wound care and total decongestive techniques    Dressing  Lt: 1/2 " foam and multiple short stretch bandages.    Dressing RT alginate anteriorly f/b profore compression dressing with 1/2" foam                     PT Short Term Goals - 11/12/20 1446      PT SHORT TERM GOAL #1   Title Patient will be independent in self management strategies in  order to reduce the risk of wound reoccurance.    Time 3  Period Weeks    Status Achieved    Target Date 11/13/20             PT Long Term Goals - 11/12/20 1446      PT LONG TERM GOAL #1   Title Patient will be able to don/doff compression garments independently in order to manage LE edema.    Time 6    Period Weeks    Status On-going      PT LONG TERM GOAL #2   Title PT to understand and verbalize that she needs to wear her compression garment and pump on a daily basis at D/C or edema will return    Time 6    Period Weeks    Status Achieved      PT LONG TERM GOAL #3   Title Patient will be able to wear her shoes again for improved ambulation ability.    Time 6    Period Weeks    Status On-going      PT LONG TERM GOAL #4   Title PT to have lost 6-8 cm in LE; 4 cm in foot to reduce risk of cellulitis    Time 5    Period Weeks    Status Achieved                 Plan - 11/12/20 1304    Clinical Impression Statement Pt edma and wound measured.  Noted improvement in drainage of wound .    Personal Factors and Comorbidities Fitness;Behavior Pattern;Past/Current Experience;Comorbidity 3+;Time since onset of injury/illness/exacerbation;Transportation    Comorbidities lymphedema, HTN, obesity    Examination-Activity Limitations Bathing;Locomotion Level;Transfers;Stairs;Stand;Dressing;Hygiene/Grooming    Examination-Participation Restrictions Meal Prep;Community Activity;Volunteer;Yard Work;Shop;Laundry;Cleaning    Stability/Clinical Decision Making Evolving/Moderate complexity    Rehab Potential Good    PT Frequency 3x / week    PT Duration 6 weeks    PT Treatment/Interventions ADLs/Self Care Home Management;Manual techniques;Manual lymph drainage;Compression bandaging;Gait training;Stair training;Functional mobility training;Therapeutic activities;Therapeutic exercise;Balance training;Neuromuscular re-education;Vasopneumatic Device;Taping;Splinting;Energy  conservation;Passive range of motion;Scar mobilization;Patient/family education;DME Instruction    PT Next Visit Plan Continue with  lymphedema management, wound care    PT Home Exercise Plan ankle pumps, elevation; 4/7:  see above    Consulted and Agree with Plan of Care Patient           Patient will benefit from skilled therapeutic intervention in order to improve the following deficits and impairments:  Abnormal gait,Cardiopulmonary status limiting activity,Decreased endurance,Decreased activity tolerance,Decreased skin integrity,Pain,Increased edema,Decreased mobility  Visit Diagnosis: Abscess of right leg  Generalized edema  Difficulty in walking, not elsewhere classified  Lymphedema, not elsewhere classified  Abscess of right lower extremity excluding foot  Bilateral lower extremity edema     Problem List Patient Active Problem List   Diagnosis Date Noted  . Abscess of right lower extremity excluding foot 10/18/2020  . Abscess of right leg 10/18/2020  . Dehydration 10/03/2020  . Leukocytosis 10/03/2020  . Thrombocytosis 10/03/2020  . Hyponatremia 10/03/2020  . Hypochloremia 10/03/2020  . Failure to thrive in adult 10/03/2020  . Acute renal failure superimposed on stage 3b chronic kidney disease ([place]) 10/03/2020  . Uncontrolled type 2 diabetes mellitus with hyperglycemia, without long-term current use of insulin ([place]) 10/03/2020  . Acute kidney injury superimposed on CKD ([place]) 10/02/2020  . Chronic kidney disease 10/01/2020  . 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[place]) 05/12/2020  . Chronic insomnia 01/08/2020  . Ocular myasthenia gravis ([place]) 09/18/2019  . Peripheral edema 01/23/2019  . Asthma 05/14/2015  .  Heel spur 02/24/2015  . Depression 02/24/2015  . Post-menopausal bleeding 06/02/2014  . OA (osteoarthritis) of knee 04/09/2014  . Epidermal cyst 10/01/2013  . Class 3 obesity 02/25/2013  . Back pain 03/04/2012  . Gout 01/25/2012  . Edema 01/28/2011  .  Obesity hypoventilation syndrome ([place]) 06/16/2008  . OBSTRUCTIVE SLEEP APNEA 05/08/2008  . Hyperlipidemia 08/28/2006  . Anxiety 08/28/2006  . Essential hypertension 08/28/2006  . GERD 08/28/2006  . Osteoarthritis 08/28/2006   Rayetta Humphrey, PT CLT [phone] 11/12/2020, 2:48 PM  [facility] [address] [place], [place], [address] Phone: [phone]   Fax:  [phone]  Name: COREEN SHIPPEE MRN: [id number] Date of Birth: September 16, 1950

## 2020-11-13 ENCOUNTER — Encounter (HOSPITAL_COMMUNITY): Payer: Self-pay | Admitting: Physical Therapy

## 2020-11-13 ENCOUNTER — Ambulatory Visit (HOSPITAL_COMMUNITY): Payer: Medicare Other | Admitting: Physical Therapy

## 2020-11-13 DIAGNOSIS — R262 Difficulty in walking, not elsewhere classified: Secondary | ICD-10-CM | POA: Diagnosis not present

## 2020-11-13 DIAGNOSIS — R600 Localized edema: Secondary | ICD-10-CM | POA: Diagnosis not present

## 2020-11-13 DIAGNOSIS — L02415 Cutaneous abscess of right lower limb: Secondary | ICD-10-CM | POA: Diagnosis not present

## 2020-11-13 DIAGNOSIS — I890 Lymphedema, not elsewhere classified: Secondary | ICD-10-CM

## 2020-11-13 DIAGNOSIS — R601 Generalized edema: Secondary | ICD-10-CM

## 2020-11-13 NOTE — Therapy (Signed)
[place], [place], [address] Phone: [phone]   Fax:  [phone]  Wound Care Therapy  Patient Details  Name: [name] MRN: [id number] Date of Birth: [birth year] No data recorded  Encounter Date: 11/13/2020   PT End of Session - 11/13/20 1223    Visit Number 9    Number of Visits 18    Date for PT Re-Evaluation 12/04/20    Authorization Type UHC Medicare (no visit limit, no auth)    Progress Note Due on Visit 18    PT Start Time 1047    PT Stop Time 1204    PT Time Calculation (min) 77 min    Activity Tolerance Patient tolerated treatment well    Behavior During Therapy WFL for tasks assessed/performed           Past Medical History:  Diagnosis Date  . Achilles tendinitis   . Anxiety   . Asthma   . Depression   . GERD (gastroesophageal reflux disease)   . Gout   . H/O myasthenia gravis    left eye  . HTN (hypertension)   . Hyperlipidemia   . Low back pain   . Obesity hypoventilation syndrome ([place])   . Obstructive sleep apnea   . Osteoarthritis     Past Surgical History:  Procedure Laterality Date  . CATARACT EXTRACTION W/PHACO Left 09/17/2015   Procedure: CATARACT EXTRACTION PHACO AND INTRAOCULAR LENS PLACEMENT LEFT EYE cde=8.58;  Surgeon: Tonny Branch, MD;  Location: [facility];  Service: Ophthalmology;  Laterality: Left;  . CATARACT EXTRACTION W/PHACO Right 05/15/2017   Procedure: CATARACT EXTRACTION PHACO AND INTRAOCULAR LENS PLACEMENT (IOC);  Surgeon: Tonny Branch, MD;  Location: [facility];  Service: Ophthalmology;  Laterality: Right;  CDE: 6.34  . COLONOSCOPY N/A 12/25/2013   Procedure: COLONOSCOPY;  Surgeon: Rogene Houston, MD;  [facility];  Service: Endoscopy;  Laterality: N/A;  730-rescheduled Ann notified pt  . COLONOSCOPY WITH PROPOFOL N/A 10/04/2019   Procedure: COLONOSCOPY WITH PROPOFOL;  Surgeon: Rogene Houston, MD;  [facility];  Service: Endoscopy;   Laterality: N/A;  815  . CYST EXCISION N/A 09/12/2016   Procedure: EXCISION SEBACEOUS CYST, BACK;  Surgeon: Aviva Signs, MD;  Location: [facility];  Service: General;  Laterality: N/A;  . INTRAOCULAR LENS INSERTION Bilateral 2018   Dr. Tonny Branch   . POLYPECTOMY  10/04/2019   Procedure: POLYPECTOMY;  Surgeon: Rogene Houston, MD;  [facility]: [facility];  Service: Endoscopy;;  . TUBAL LIGATION      There were no vitals filed for this visit.    Subjective Assessment - 11/13/20 1207    Subjective Pt states that she fell this morning but did not hurt herself.    Pertinent History chronic edema in LE with wounds causing hospitalization, ( most likely lymphedema), gout, LBP, DM,    Limitations House hold activities;Walking    Pain Onset 1 to 4 weeks ago                     Wound Therapy - 11/13/20 0001    Subjective For her wound to heal    Patient and Family Stated Goals wound to heal    Date of Onset 07/11/21    Prior Treatments unna boots, hospitalizations    Pain Scale 0-10    Pain Score 6     Pain Type Chronic pain    Pain Location Leg  Pain Orientation Right    Pain Descriptors / Indicators Aching    Wound Properties Wound Type: Incision - Open Location: Pretibial Location Orientation: Distal;Right Present on Admission: Yes   Dressing Type Alginate;Compression wrap    Dressing Changed Changed    Dressing Status Old drainage    Dressing Change Frequency PRN    Site / Wound Assessment Granulation tissue    % Wound base Red or Granulating 100%    % Wound base Yellow/Fibrinous Exudate 0%    Peri-wound Assessment Edema    Drainage Amount Minimal    Drainage Description Serous    Treatment Cleansed;Other (Comment)   manual decongestion techniques   Wound Therapy - Clinical Statement Wound continues to approximate, is 100% granulated and draining has decreased.  Dressing changed from alginate to xeroform due to decreased drainage.  Encouraged pt to call and order  comperssion garment.    Wound Therapy - Functional Problem List unable to fit in shoes, unable to dress due to drainage of Rt LE    Factors Delaying/Impairing Wound Healing Altered sensation;Diabetes Mellitus;Infection - systemic/local;Polypharmacy    Wound Therapy - Frequency 3X / week    Wound Plan Pt edema is stable, wound is continuing to approximate may discharge when wound is closed.    Dressing  Lt: 1/2 " foam and multiple short stretch bandages.    Dressing Rt: xeroform, 2x2 f/b profore.           [facility] Adult PT Treatment/Exercise - 11/13/20 0001      Manual Therapy   Manual Therapy Manual Lymphatic Drainage (MLD);Compression Bandaging;Other (comment)    Manual therapy comments completed seperate from all other aspects of treatment.    Manual Lymphatic Drainage (MLD) to include supraclavicular, deep and superfical abdominal, inguinal/axillary anastomosis and B LE.  Done anteriorly only due to time    Compression Bandaging Lt LE foam with multiple layer of short stretch bandages to knee level. Rt due to continuing drainage used foam and profore                    PT Short Term Goals - 11/12/20 1446      PT SHORT TERM GOAL #1   Title Patient will be independent in self management strategies in order to reduce the risk of wound reoccurance.    Time 3    Period Weeks    Status Achieved    Target Date 11/13/20             PT Long Term Goals - 11/12/20 1446      PT LONG TERM GOAL #1   Title Patient will be able to don/doff compression garments independently in order to manage LE edema.    Time 6    Period Weeks    Status On-going      PT LONG TERM GOAL #2   Title PT to understand and verbalize that she needs to wear her compression garment and pump on a daily basis at D/C or edema will return    Time 6    Period Weeks    Status Achieved      PT LONG TERM GOAL #3   Title Patient will be able to wear her shoes again for improved ambulation ability.    Time  6    Period Weeks    Status On-going      PT LONG TERM GOAL #4   Title PT to have lost 6-8 cm in LE; 4 cm in foot  to reduce risk of cellulitis    Time 5    Period Weeks    Status Achieved                 Plan - 11/13/20 1224    Clinical Impression Statement see above    Personal Factors and Comorbidities Fitness;Behavior Pattern;Past/Current Experience;Comorbidity 3+;Time since onset of injury/illness/exacerbation;Transportation    Comorbidities lymphedema, HTN, obesity    Examination-Activity Limitations Bathing;Locomotion Level;Transfers;Stairs;Stand;Dressing;Hygiene/Grooming    Examination-Participation Restrictions Meal Prep;Community Activity;Volunteer;Yard Work;Shop;Laundry;Cleaning    Stability/Clinical Decision Making Evolving/Moderate complexity    Rehab Potential Good    PT Frequency 3x / week    PT Duration 6 weeks    PT Treatment/Interventions ADLs/Self Care Home Management;Manual techniques;Manual lymph drainage;Compression bandaging;Gait training;Stair training;Functional mobility training;Therapeutic activities;Therapeutic exercise;Balance training;Neuromuscular re-education;Vasopneumatic Device;Taping;Splinting;Energy conservation;Passive range of motion;Scar mobilization;Patient/family education;DME Instruction    PT Next Visit Plan Continue with  lymphedema management, wound care    PT Home Exercise Plan ankle pumps, elevation; 4/7:  see above    Consulted and Agree with Plan of Care Patient           Patient will benefit from skilled therapeutic intervention in order to improve the following deficits and impairments:  Abnormal gait,Cardiopulmonary status limiting activity,Decreased endurance,Decreased activity tolerance,Decreased skin integrity,Pain,Increased edema,Decreased mobility  Visit Diagnosis: Abscess of right leg  Generalized edema  Lymphedema, not elsewhere classified     Problem List Patient Active Problem List   Diagnosis Date Noted   . Abscess of right lower extremity excluding foot 10/18/2020  . Abscess of right leg 10/18/2020  . Dehydration 10/03/2020  . Leukocytosis 10/03/2020  . Thrombocytosis 10/03/2020  . Hyponatremia 10/03/2020  . Hypochloremia 10/03/2020  . Failure to thrive in adult 10/03/2020  . Acute renal failure superimposed on stage 3b chronic kidney disease ([place]) 10/03/2020  . Uncontrolled type 2 diabetes mellitus with hyperglycemia, without long-term current use of insulin ([place]) 10/03/2020  . Acute kidney injury superimposed on CKD ([place]) 10/02/2020  . Chronic kidney disease 10/01/2020  . 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[place]) 05/12/2020  . Chronic insomnia 01/08/2020  . Ocular myasthenia gravis ([place]) 09/18/2019  . Peripheral edema 01/23/2019  . Asthma 05/14/2015  . Heel spur 02/24/2015  . Depression 02/24/2015  . Post-menopausal bleeding 06/02/2014  . OA (osteoarthritis) of knee 04/09/2014  . Epidermal cyst 10/01/2013  . Class 3 obesity 02/25/2013  . Back pain 03/04/2012  . Gout 01/25/2012  . Edema 01/28/2011  . Obesity hypoventilation syndrome ([place]) 06/16/2008  . OBSTRUCTIVE SLEEP APNEA 05/08/2008  . Hyperlipidemia 08/28/2006  . Anxiety 08/28/2006  . Essential hypertension 08/28/2006  . GERD 08/28/2006  . Osteoarthritis 08/28/2006   Rayetta Humphrey, PT CLT [phone] 11/13/2020, 12:25 PM  [facility] [address] [place], [place], [address] Phone: [phone]   Fax:  [phone]  Name: SHARANYA TEMPLIN MRN: [id number] Date of Birth: [birth year]

## 2020-11-16 ENCOUNTER — Ambulatory Visit (HOSPITAL_COMMUNITY): Payer: Medicare Other | Admitting: Physical Therapy

## 2020-11-16 ENCOUNTER — Other Ambulatory Visit: Payer: Self-pay

## 2020-11-16 DIAGNOSIS — I890 Lymphedema, not elsewhere classified: Secondary | ICD-10-CM

## 2020-11-16 DIAGNOSIS — R600 Localized edema: Secondary | ICD-10-CM

## 2020-11-16 DIAGNOSIS — R601 Generalized edema: Secondary | ICD-10-CM | POA: Diagnosis not present

## 2020-11-16 DIAGNOSIS — R262 Difficulty in walking, not elsewhere classified: Secondary | ICD-10-CM | POA: Diagnosis not present

## 2020-11-16 DIAGNOSIS — L02415 Cutaneous abscess of right lower limb: Secondary | ICD-10-CM

## 2020-11-16 NOTE — Therapy (Signed)
[place], [place], [address] Phone: [phone]   Fax:  [phone]  Wound Care Therapy  Patient Details  Name: [name] MRN: [id number] Date of Birth: Nov 19, 1950 No data recorded  Encounter Date: 11/16/2020   PT End of Session - 11/16/20 1301    Visit Number 10    Number of Visits 18    Date for PT Re-Evaluation 12/04/20    Authorization Type UHC Medicare (no visit limit, no auth)    Progress Note Due on Visit 18    PT Start Time 1050    PT Stop Time 1140    PT Time Calculation (min) 50 min    Activity Tolerance Patient tolerated treatment well    Behavior During Therapy [facility] for tasks assessed/performed           Past Medical History:  Diagnosis Date  . Achilles tendinitis   . Anxiety   . Asthma   . Depression   . GERD (gastroesophageal reflux disease)   . Gout   . H/O myasthenia gravis    left eye  . HTN (hypertension)   . Hyperlipidemia   . Low back pain   . Obesity hypoventilation syndrome ([place])   . Obstructive sleep apnea   . Osteoarthritis     Past Surgical History:  Procedure Laterality Date  . CATARACT EXTRACTION W/PHACO Left 09/17/2015   Procedure: CATARACT EXTRACTION PHACO AND INTRAOCULAR LENS PLACEMENT LEFT EYE cde=8.58;  Surgeon: Tonny Branch, MD;  Location: [facility];  Service: Ophthalmology;  Laterality: Left;  . CATARACT EXTRACTION W/PHACO Right 05/15/2017   Procedure: CATARACT EXTRACTION PHACO AND INTRAOCULAR LENS PLACEMENT (IOC);  Surgeon: Tonny Branch, MD;  Location: [facility];  Service: Ophthalmology;  Laterality: Right;  CDE: 6.34  . COLONOSCOPY N/A 12/25/2013   Procedure: COLONOSCOPY;  Surgeon: Rogene Houston, MD;  [facility];  Service: Endoscopy;  Laterality: N/A;  730-rescheduled Ann notified pt  . COLONOSCOPY WITH PROPOFOL N/A 10/04/2019   Procedure: COLONOSCOPY WITH PROPOFOL;  Surgeon: Rogene Houston, MD;  [facility];  Service: Endoscopy;   Laterality: N/A;  815  . CYST EXCISION N/A 09/12/2016   Procedure: EXCISION SEBACEOUS CYST, BACK;  Surgeon: Aviva Signs, MD;  Location: [facility];  Service: General;  Laterality: N/A;  . INTRAOCULAR LENS INSERTION Bilateral 2018   Dr. Tonny Branch   . POLYPECTOMY  10/04/2019   Procedure: POLYPECTOMY;  Surgeon: Rogene Houston, MD;  [facility];  Service: Endoscopy;;  . TUBAL LIGATION      There were no vitals filed for this visit.               Wound Therapy - 11/16/20 0001    Subjective Pt states that she did as the therapist said and took the short stretch bandages off yesterday morning to wash them.  She pumped and enjoyed the pump but by the evening she noted blisters coming up on her RT LE.    Patient and Family Stated Goals wound to heal    Date of Onset 07/11/21    Prior Treatments unna boots, hospitalizations    Pain Scale 0-10    Pain Score 8     Pain Type Chronic pain    Pain Location Leg    Pain Orientation Right;Left    Pain Descriptors / Indicators Aching;Burning    Wound Properties Date First Assessed: 11/16/20 Time First Assessed: 1050 Wound Type: Other (Comment) Location: Pretibial  Location Orientation: Left Wound Description (Comments): multiple blisters on anterior, lateral, posterior and medial aspect of Lt LE Present on Admission: No   Wound Image View All Images View Images    Dressing Type Gauze (Comment)    Dressing Changed Changed    Dressing Status New drainage    Dressing Change Frequency PRN    Site / Wound Assessment Friable    Peri-wound Assessment Edema    Drainage Amount Copious    Drainage Description Serous    Treatment Debridement (Selective);Other (Comment)    Wound Properties Wound Type: Incision - Open Location: Pretibial Location Orientation: Distal;Right Present on Admission: Yes   Dressing Type Gauze (Comment);Compression wrap    Dressing Changed Changed    Dressing Status Old drainage    Dressing Change Frequency PRN     % Wound base Red or Granulating 100%    Peri-wound Assessment Edema    Wound Length (cm) 1 cm    Wound Width (cm) 0.2 cm    Wound Surface Area (cm^2) 0.2 cm^2    Drainage Amount None    Treatment Cleansed    Selective Debridement - Location multiple blisters on Lt LE  at least 8 lg with multiple small    Selective Debridement - Tools Used Forceps    Selective Debridement - Tissue Removed devitalized skin    Wound Therapy - Clinical Statement Pt did as directed in taking short stretch bandages off yesterday to wash.  With just one day without compression pt comes back to the departement with multiple draining blisters on her Lt LE.  Her drainage is so bad that the gauze that she has placed around her leg and her sock and her pant leg is soaked.  Therapist removed most blisters, cleansed leg, place xeroform with multiple calcium alginate sheets to absorb drainage followed by profore and 1/2 " foam.  Therapist explained to pt to call tomorrow if drainage is coming thru and let them know that I had instructed her to call clinic and have them see pt to redress Lt LE only.  Rt LE continues to decrease in edema and wound is almost completely healed    Wound Therapy - Functional Problem List unable to fit in shoes, unable to dress due to drainage of Rt LE    Factors Delaying/Impairing Wound Healing Altered sensation;Diabetes Mellitus;Infection - systemic/local;Polypharmacy    Wound Therapy - Frequency 3X / week    Dressing  LT: xeroform around LE, multiple shees of calcium alginate and profore    Dressing Rt: xeroform, 2x2 f/b profore.                     PT Short Term Goals - 11/12/20 1446      PT SHORT TERM GOAL #1   Title Patient will be independent in self management strategies in order to reduce the risk of wound reoccurance.    Time 3    Period Weeks    Status Achieved    Target Date 11/13/20             PT Long Term Goals - 11/12/20 1446      PT LONG TERM GOAL #1    Title Patient will be able to don/doff compression garments independently in order to manage LE edema.    Time 6    Period Weeks    Status On-going      PT LONG TERM GOAL #2   Title PT to understand and verbalize that she  needs to wear her compression garment and pump on a daily basis at D/C or edema will return    Time 6    Period Weeks    Status Achieved      PT LONG TERM GOAL #3   Title Patient will be able to wear her shoes again for improved ambulation ability.    Time 6    Period Weeks    Status On-going      PT LONG TERM GOAL #4   Title PT to have lost 6-8 cm in LE; 4 cm in foot to reduce risk of cellulitis    Time 5    Period Weeks    Status Achieved                 Plan - 11/16/20 1301    Clinical Impression Statement see above    Personal Factors and Comorbidities Fitness;Behavior Pattern;Past/Current Experience;Comorbidity 3+;Time since onset of injury/illness/exacerbation;Transportation    Comorbidities lymphedema, HTN, obesity    Examination-Activity Limitations Bathing;Locomotion Level;Transfers;Stairs;Stand;Dressing;Hygiene/Grooming    Examination-Participation Restrictions Meal Prep;Community Activity;Volunteer;Yard Work;Shop;Laundry;Cleaning    Stability/Clinical Decision Making Evolving/Moderate complexity    Rehab Potential Good    PT Frequency 3x / week    PT Duration 6 weeks    PT Treatment/Interventions ADLs/Self Care Home Management;Manual techniques;Manual lymph drainage;Compression bandaging;Gait training;Stair training;Functional mobility training;Therapeutic activities;Therapeutic exercise;Balance training;Neuromuscular re-education;Vasopneumatic Device;Taping;Splinting;Energy conservation;Passive range of motion;Scar mobilization;Patient/family education;DME Instruction    PT Next Visit Plan Continue with  lymphedema management, wound care    PT Home Exercise Plan ankle pumps, elevation; 4/7:  see above    Consulted and Agree with Plan of Care  Patient           Patient will benefit from skilled therapeutic intervention in order to improve the following deficits and impairments:  Abnormal gait,Cardiopulmonary status limiting activity,Decreased endurance,Decreased activity tolerance,Decreased skin integrity,Pain,Increased edema,Decreased mobility  Visit Diagnosis: Abscess of right leg  Generalized edema  Lymphedema, not elsewhere classified  Abscess of right lower extremity excluding foot  Bilateral lower extremity edema     Problem List Patient Active Problem List   Diagnosis Date Noted  . Abscess of right lower extremity excluding foot 10/18/2020  . Abscess of right leg 10/18/2020  . Dehydration 10/03/2020  . Leukocytosis 10/03/2020  . Thrombocytosis 10/03/2020  . Hyponatremia 10/03/2020  . Hypochloremia 10/03/2020  . Failure to thrive in adult 10/03/2020  . Acute renal failure superimposed on stage 3b chronic kidney disease ([place]) 10/03/2020  . Uncontrolled type 2 diabetes mellitus with hyperglycemia, without long-term current use of insulin ([place]) 10/03/2020  . Acute kidney injury superimposed on CKD ([place]) 10/02/2020  . Chronic kidney disease 10/01/2020  . 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[place]) 05/12/2020  . Chronic insomnia 01/08/2020  . Ocular myasthenia gravis ([place]) 09/18/2019  . Peripheral edema 01/23/2019  . Asthma 05/14/2015  . Heel spur 02/24/2015  . Depression 02/24/2015  . Post-menopausal bleeding 06/02/2014  . OA (osteoarthritis) of knee 04/09/2014  . Epidermal cyst 10/01/2013  . Class 3 obesity 02/25/2013  . Back pain 03/04/2012  . Gout 01/25/2012  . Edema 01/28/2011  . Obesity hypoventilation syndrome ([place]) 06/16/2008  . OBSTRUCTIVE SLEEP APNEA 05/08/2008  . Hyperlipidemia 08/28/2006  . Anxiety 08/28/2006  . Essential hypertension 08/28/2006  . GERD 08/28/2006  . Osteoarthritis 08/28/2006    Rayetta Humphrey, PT CLT [phone] 11/16/2020, 1:03 PM  [facility] [address] [place], [place], [address] Phone: [phone]   Fax:  [phone]  Name:  [name] MRN: [id number] Date of Birth: August 30, 1950

## 2020-11-17 ENCOUNTER — Other Ambulatory Visit: Payer: Self-pay | Admitting: *Deleted

## 2020-11-17 NOTE — Patient Outreach (Addendum)
[facility]) Care Management  11/17/2020  [name] 1950/08/15 [id number]   Outreach attempt #2, unsuccessful, unable to leave voice message.  Will send outreach letter and follow up within the next 3-4 business days.   Update:  Incoming call received back from member.  Confirms that she has heard from new PCP office and they will be taking her as a new patient. She has signed more release papers and they are reviewing medications, will call to schedule appointment.  New PCP is not within [facility] therefore member no longer eligible for services.  Will close case at this time.   Valente David, [place], MSN [place] [phone]

## 2020-11-18 ENCOUNTER — Ambulatory Visit (HOSPITAL_COMMUNITY): Payer: Medicare Other | Admitting: Physical Therapy

## 2020-11-18 ENCOUNTER — Other Ambulatory Visit: Payer: Self-pay

## 2020-11-18 DIAGNOSIS — R600 Localized edema: Secondary | ICD-10-CM | POA: Diagnosis not present

## 2020-11-18 DIAGNOSIS — R262 Difficulty in walking, not elsewhere classified: Secondary | ICD-10-CM | POA: Diagnosis not present

## 2020-11-18 DIAGNOSIS — L02415 Cutaneous abscess of right lower limb: Secondary | ICD-10-CM

## 2020-11-18 DIAGNOSIS — R601 Generalized edema: Secondary | ICD-10-CM | POA: Diagnosis not present

## 2020-11-18 DIAGNOSIS — I890 Lymphedema, not elsewhere classified: Secondary | ICD-10-CM | POA: Diagnosis not present

## 2020-11-18 NOTE — Therapy (Signed)
[facility] [address] [place], [place], [address] Phone: [phone]   Fax:  [phone]  Physical Therapy Treatment  Patient Details  Name: [name] MRN: [id number] Date of Birth: Sep 16, 1950 No data recorded  Encounter Date: 11/18/2020   PT End of Session - 11/18/20 1713    Visit Number 11    Number of Visits 18    Date for PT Re-Evaluation 12/04/20    Authorization Type UHC Medicare (no visit limit, no auth)    Progress Note Due on Visit 18    PT Start Time 1320    PT Stop Time 1430    PT Time Calculation (min) 70 min    Activity Tolerance Patient tolerated treatment well    Behavior During Therapy WFL for tasks assessed/performed           Past Medical History:  Diagnosis Date  . Achilles tendinitis   . Anxiety   . Asthma   . Depression   . GERD (gastroesophageal reflux disease)   . Gout   . H/O myasthenia gravis    left eye  . HTN (hypertension)   . Hyperlipidemia   . Low back pain   . Obesity hypoventilation syndrome ([place])   . Obstructive sleep apnea   . Osteoarthritis     Past Surgical History:  Procedure Laterality Date  . CATARACT EXTRACTION W/PHACO Left 09/17/2015   Procedure: CATARACT EXTRACTION PHACO AND INTRAOCULAR LENS PLACEMENT LEFT EYE cde=8.58;  Surgeon: Tonny Branch, MD;  Location: [facility];  Service: Ophthalmology;  Laterality: Left;  . CATARACT EXTRACTION W/PHACO Right 05/15/2017   Procedure: CATARACT EXTRACTION PHACO AND INTRAOCULAR LENS PLACEMENT (IOC);  Surgeon: Tonny Branch, MD;  Location: [facility];  Service: Ophthalmology;  Laterality: Right;  CDE: 6.34  . COLONOSCOPY N/A 12/25/2013   Procedure: COLONOSCOPY;  Surgeon: Rogene Houston, MD;  [facility]: [facility];  Service: Endoscopy;  Laterality: N/A;  730-rescheduled Ann notified pt  . COLONOSCOPY WITH PROPOFOL N/A 10/04/2019   Procedure: COLONOSCOPY WITH PROPOFOL;  Surgeon: Rogene Houston, MD;  [facility];  Service: Endoscopy;   Laterality: N/A;  815  . CYST EXCISION N/A 09/12/2016   Procedure: EXCISION SEBACEOUS CYST, BACK;  Surgeon: Aviva Signs, MD;  Location: [facility];  Service: General;  Laterality: N/A;  . INTRAOCULAR LENS INSERTION Bilateral 2018   Dr. Tonny Branch   . POLYPECTOMY  10/04/2019   Procedure: POLYPECTOMY;  Surgeon: Rogene Houston, MD;  [facility]: [facility];  Service: Endoscopy;;  . TUBAL LIGATION      There were no vitals filed for this visit.          LYMPHEDEMA/ONCOLOGY QUESTIONNAIRE - 11/18/20 0001      Right Lower Extremity Lymphedema   30 cm Proximal to Floor at Lateral Plantar Foot 41 cm (P)     20 cm Proximal to Floor at Lateral Plantar Foot 34.5 1 (P)     10 cm Proximal to Floor at Lateral Malleoli 30.2 cm (P)     Circumference of ankle/heel 34.5 cm. (P)     5 cm Proximal to 1st MTP Joint 25.8 cm (P)     Across MTP Joint 25.2 cm (P)       Left Lower Extremity Lymphedema   30 cm Proximal to Floor at Lateral Plantar Foot 40 cm (P)     20 cm Proximal to Floor at Lateral Plantar Foot 36.3 cm (P)     10 cm Proximal  to Floor at Lateral Malleoli 33 cm (P)     Circumference of ankle/heel 36.7 cm. (P)     5 cm Proximal to 1st MTP Joint 26.7 cm (P)     Across MTP Joint 26.5 cm (P)                     Wound Therapy - 11/18/20 0001    Subjective Pt states that she did as the therapist said and took the short stretch bandages off yesterday morning to wash them.  She pumped and enjoyed the pump but by the evening she noted blisters coming up on her RT LE.    Patient and Family Stated Goals wound to heal    Date of Onset 07/11/21    Prior Treatments unna boots, hospitalizations    Pain Scale 0-10    Pain Score 6     Pain Type Chronic pain    Pain Location Leg    Pain Orientation Right;Left    Pain Descriptors / Indicators Aching;Burning    Wound Properties Wound Type: Incision - Open Location: Pretibial Location Orientation: Distal;Right Present on Admission: Yes Final  Assessment Date: 11/18/20 Final Assessment Time: 1400   Wound Properties Date First Assessed: 11/16/20 Time First Assessed: 1050 Wound Type: Other (Comment) Location: Pretibial Location Orientation: Left Wound Description (Comments): multiple blisters on anterior, lateral, posterior and medial aspect of Lt LE Present on Admission: No   Dressing Type Bismuth petroleum;Compression wrap    Dressing Changed Changed    Dressing Status Old drainage;New drainage    Dressing Change Frequency PRN    Site / Wound Assessment Friable    % Wound base Red or Granulating 100%    Peri-wound Assessment Edema    Drainage Amount Copious    Drainage Description Serous    Treatment Cleansed;Other (Comment)    Selective Debridement - Location --    Selective Debridement - Tools Used --    Selective Debridement - Tissue Removed --    Wound Therapy - Clinical Statement Rt wound is healed now.  Lt LE where pt had blisters is now raw granulated tissue covering almost the entire 1/3 distal aspect of her LE.  Pt Lt LE continues to have copious drainage but  not as much as on Monday.  Therapist cut new foam as old foam was soaked.  No inguinal/axillary anastomosis due to noted swelling in pt hands    Wound Therapy - Functional Problem List unable to fit in shoes, unable to dress due to drainage of Rt LE    Factors Delaying/Impairing Wound Healing Altered sensation;Diabetes Mellitus;Infection - systemic/local;Polypharmacy    Wound Therapy - Frequency 3X / week    Dressing  LT: xeroform around LE, multiple shees of calcium alginate and profore    Dressing Rt: profore            [facility] Adult PT Treatment/Exercise - 11/18/20 0001      Manual Therapy   Manual Therapy Manual Lymphatic Drainage (MLD);Compression Bandaging;Other (comment)    Manual therapy comments completed seperate from all other aspects of treatment.    Manual Lymphatic Drainage (MLD) LE    Compression Bandaging B profore compresion with foam                     PT Short Term Goals - 11/18/20 1714      PT SHORT TERM GOAL #1   Title Patient will be independent in self management strategies in order to  reduce the risk of wound reoccurance.    Time 3    Period Weeks    Status Achieved    Target Date 11/13/20             PT Long Term Goals - 11/18/20 1714      PT LONG TERM GOAL #1   Title Patient will be able to don/doff compression garments independently in order to manage LE edema.    Time 6    Period Weeks    Status On-going      PT LONG TERM GOAL #2   Title PT to understand and verbalize that she needs to wear her compression garment and pump on a daily basis at D/C or edema will return    Time 6    Period Weeks    Status Achieved      PT LONG TERM GOAL #3   Title Patient will be able to wear her shoes again for improved ambulation ability.    Time 6    Period Weeks    Status On-going      PT LONG TERM GOAL #4   Title PT to have lost 6-8 cm in LE; 4 cm in foot to reduce risk of cellulitis    Time 5    Period Weeks    Status Achieved                 Plan - 11/18/20 1713    Clinical Impression Statement see above    Personal Factors and Comorbidities Fitness;Behavior Pattern;Past/Current Experience;Comorbidity 3+;Time since onset of injury/illness/exacerbation;Transportation    Comorbidities lymphedema, HTN, obesity    Examination-Activity Limitations Bathing;Locomotion Level;Transfers;Stairs;Stand;Dressing;Hygiene/Grooming    Examination-Participation Restrictions Meal Prep;Community Activity;Volunteer;Yard Work;Shop;Laundry;Cleaning    Stability/Clinical Decision Making Evolving/Moderate complexity    Rehab Potential Good    PT Frequency 3x / week    PT Duration 6 weeks    PT Treatment/Interventions ADLs/Self Care Home Management;Manual techniques;Manual lymph drainage;Compression bandaging;Gait training;Stair training;Functional mobility training;Therapeutic activities;Therapeutic  exercise;Balance training;Neuromuscular re-education;Vasopneumatic Device;Taping;Splinting;Energy conservation;Passive range of motion;Scar mobilization;Patient/family education;DME Instruction    PT Next Visit Plan Continue with  lymphedema management, wound care    PT Home Exercise Plan ankle pumps, elevation; 4/7:  see above    Consulted and Agree with Plan of Care Patient           Patient will benefit from skilled therapeutic intervention in order to improve the following deficits and impairments:  Abnormal gait,Cardiopulmonary status limiting activity,Decreased endurance,Decreased activity tolerance,Decreased skin integrity,Pain,Increased edema,Decreased mobility  Visit Diagnosis: Abscess of right leg  Generalized edema  Lymphedema, not elsewhere classified  Abscess of right lower extremity excluding foot  Bilateral lower extremity edema  Difficulty in walking, not elsewhere classified     Problem List Patient Active Problem List   Diagnosis Date Noted  . Abscess of right lower extremity excluding foot 10/18/2020  . Abscess of right leg 10/18/2020  . Dehydration 10/03/2020  . Leukocytosis 10/03/2020  . Thrombocytosis 10/03/2020  . Hyponatremia 10/03/2020  . Hypochloremia 10/03/2020  . Failure to thrive in adult 10/03/2020  . Acute renal failure superimposed on stage 3b chronic kidney disease ([place]) 10/03/2020  . Uncontrolled type 2 diabetes mellitus with hyperglycemia, without long-term current use of insulin ([place]) 10/03/2020  . Acute kidney injury superimposed on CKD ([place]) 10/02/2020  . Chronic kidney disease 10/01/2020  . 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[place]) 05/12/2020  . Chronic insomnia 01/08/2020  . Ocular myasthenia gravis ([place]) 09/18/2019  . Peripheral edema 01/23/2019  . Asthma  05/14/2015  . Heel spur 02/24/2015  . Depression 02/24/2015  . Post-menopausal bleeding 06/02/2014  . OA (osteoarthritis) of knee 04/09/2014  . Epidermal cyst 10/01/2013  .  Class 3 obesity 02/25/2013  . Back pain 03/04/2012  . Gout 01/25/2012  . Edema 01/28/2011  . Obesity hypoventilation syndrome ([place]) 06/16/2008  . OBSTRUCTIVE SLEEP APNEA 05/08/2008  . Hyperlipidemia 08/28/2006  . Anxiety 08/28/2006  . Essential hypertension 08/28/2006  . GERD 08/28/2006  . Osteoarthritis 08/28/2006    Rayetta Humphrey, PT CLT [phone] 11/18/2020, 5:15 PM  [facility] [address] [place], [place], [address] Phone: [phone]   Fax:  [phone]  Name: MILLIANI HERRADA MRN: [id number] Date of Birth: [birth year]

## 2020-11-20 ENCOUNTER — Encounter (HOSPITAL_COMMUNITY): Payer: Self-pay | Admitting: Physical Therapy

## 2020-11-20 ENCOUNTER — Ambulatory Visit (HOSPITAL_COMMUNITY): Payer: Medicare Other | Admitting: Physical Therapy

## 2020-11-20 ENCOUNTER — Other Ambulatory Visit: Payer: Self-pay

## 2020-11-20 DIAGNOSIS — I890 Lymphedema, not elsewhere classified: Secondary | ICD-10-CM | POA: Diagnosis not present

## 2020-11-20 DIAGNOSIS — R600 Localized edema: Secondary | ICD-10-CM | POA: Diagnosis not present

## 2020-11-20 DIAGNOSIS — R601 Generalized edema: Secondary | ICD-10-CM | POA: Diagnosis not present

## 2020-11-20 DIAGNOSIS — R262 Difficulty in walking, not elsewhere classified: Secondary | ICD-10-CM | POA: Diagnosis not present

## 2020-11-20 DIAGNOSIS — J449 Chronic obstructive pulmonary disease, unspecified: Secondary | ICD-10-CM | POA: Diagnosis not present

## 2020-11-20 DIAGNOSIS — L02415 Cutaneous abscess of right lower limb: Secondary | ICD-10-CM | POA: Diagnosis not present

## 2020-11-20 NOTE — Therapy (Signed)
[facility] [place], [place], [address] Phone: [phone]   Fax:  [phone]  Physical Therapy Treatment  Patient Details  Name: [name] MRN: [id number] Date of Birth: January 08, 1951 No data recorded  Encounter Date: 11/20/2020   PT End of Session - 11/20/20 1614    Visit Number 12    Number of Visits 20    Date for PT Re-Evaluation 12/04/20    Authorization Type UHC Medicare (no visit limit, no auth)    Progress Note Due on Visit 18    PT Start Time 1445    PT Stop Time 1550    PT Time Calculation (min) 65 min    Activity Tolerance Patient tolerated treatment well    Behavior During Therapy [facility] for tasks assessed/performed           Past Medical History:  Diagnosis Date  . Achilles tendinitis   . Anxiety   . Asthma   . Depression   . GERD (gastroesophageal reflux disease)   . Gout   . H/O myasthenia gravis    left eye  . HTN (hypertension)   . Hyperlipidemia   . Low back pain   . Obesity hypoventilation syndrome ([place])   . Obstructive sleep apnea   . Osteoarthritis     Past Surgical History:  Procedure Laterality Date  . CATARACT EXTRACTION W/PHACO Left 09/17/2015   Procedure: CATARACT EXTRACTION PHACO AND INTRAOCULAR LENS PLACEMENT LEFT EYE cde=8.58;  Surgeon: Tonny Branch, MD;  Location: [facility];  Service: Ophthalmology;  Laterality: Left;  . CATARACT EXTRACTION W/PHACO Right 05/15/2017   Procedure: CATARACT EXTRACTION PHACO AND INTRAOCULAR LENS PLACEMENT (IOC);  Surgeon: Tonny Branch, MD;  Location: [facility];  Service: Ophthalmology;  Laterality: Right;  CDE: 6.34  . COLONOSCOPY N/A 12/25/2013   Procedure: COLONOSCOPY;  Surgeon: Rogene Houston, MD;  [facility];  Service: Endoscopy;  Laterality: N/A;  730-rescheduled Ann notified pt  . COLONOSCOPY WITH PROPOFOL N/A 10/04/2019   Procedure: COLONOSCOPY WITH PROPOFOL;  Surgeon: Rogene Houston, MD;  [facility];  Service: Endoscopy;   Laterality: N/A;  815  . CYST EXCISION N/A 09/12/2016   Procedure: EXCISION SEBACEOUS CYST, BACK;  Surgeon: Aviva Signs, MD;  Location: [facility];  Service: General;  Laterality: N/A;  . INTRAOCULAR LENS INSERTION Bilateral 2018   Dr. Tonny Branch   . POLYPECTOMY  10/04/2019   Procedure: POLYPECTOMY;  Surgeon: Rogene Houston, MD;  [facility];  Service: Endoscopy;;  . TUBAL LIGATION      There were no vitals filed for this visit.         Wound Therapy - 11/20/20 0001    Subjective PT states that her legs have not felt to bad after Wed.    Patient and Family Stated Goals wound to heal    Date of Onset 07/11/21    Prior Treatments unna boots, hospitalizations    Pain Scale 0-10    Pain Score 4     Pain Type Chronic pain    Pain Location Leg    Pain Orientation Right;Left    Pain Descriptors / Indicators Aching;Burning    Wound Properties Date First Assessed: 11/16/20 Time First Assessed: 1050 Wound Type: Other (Comment) Location: Pretibial Location Orientation: Left Wound Description (Comments): multiple blisters on anterior, lateral, posterior and medial aspect of Lt LE Present on Admission: No   Dressing Type Bismuth petroleum;Compression wrap;Alginate    Dressing Changed Changed  Dressing Status New drainage;Old drainage    Dressing Change Frequency PRN    Site / Wound Assessment Friable    % Wound base Red or Granulating 100%    Peri-wound Assessment Edema    Drainage Amount Copious    Drainage Description Green;Odor    Treatment Cleansed;Other (Comment)    Wound Therapy - Clinical Statement PT Rt LE wound is healed, therapist began compression dressing with short stretch bandages and 1/2 in foam for this leg.  Pt LE dressing were soaked with bright green drainage therapist explained that she needed antibiotics.  She has been accepted at a primary MD office but has not had her first visit.   Therapist suggested urgent care as an alternative. Changed dressing to  vaseline around intact skin, silver alginate to all areas that are open from the popped blisters followed by alginate, 4x4 and profore compression bandaging system with new 1/2" foam as old had gotten wet from drainage of LE.  PT voiced she was going to try and get antibiotic.  PT reviewed self manual techniques to decrease edema in UE.  Edema in B UE has gone down but is still noted.    Wound Therapy - Functional Problem List unable to fit in shoes, unable to dress due to drainage of Rt LE    Factors Delaying/Impairing Wound Healing Altered sensation;Diabetes Mellitus;Infection - systemic/local;Polypharmacy    Wound Therapy - Frequency 3X / week    Dressing  LT: silver alginate, calcium alginate, 4x4, 1/2 " foam and profore    Dressing Rt: compression wrapping with 1/2" foam and multiple layer of short stretch bandages.            [facility] Adult PT Treatment/Exercise - 11/20/20 0001      Manual Therapy   Manual Therapy Manual Lymphatic Drainage (MLD);Compression Bandaging;Other (comment)    Manual therapy comments completed seperate from all other aspects of treatment.    Manual Lymphatic Drainage (MLD) to  B LE only    Compression Bandaging Rt LE 1/2" foam with short stretch bandages; Lt profore                    PT Short Term Goals - 11/18/20 1714      PT SHORT TERM GOAL #1   Title Patient will be independent in self management strategies in order to reduce the risk of wound reoccurance.    Time 3    Period Weeks    Status Achieved    Target Date 11/13/20             PT Long Term Goals - 11/18/20 1714      PT LONG TERM GOAL #1   Title Patient will be able to don/doff compression garments independently in order to manage LE edema.    Time 6    Period Weeks    Status On-going      PT LONG TERM GOAL #2   Title PT to understand and verbalize that she needs to wear her compression garment and pump on a daily basis at D/C or edema will return    Time 6    Period  Weeks    Status Achieved      PT LONG TERM GOAL #3   Title Patient will be able to wear her shoes again for improved ambulation ability.    Time 6    Period Weeks    Status On-going      PT LONG TERM GOAL #4  Title PT to have lost 6-8 cm in LE; 4 cm in foot to reduce risk of cellulitis    Time 5    Period Weeks    Status Achieved                  Patient will benefit from skilled therapeutic intervention in order to improve the following deficits and impairments:     Visit Diagnosis: Lymphedema, not elsewhere classified  Generalized edema     Problem List Patient Active Problem List   Diagnosis Date Noted  . Abscess of right lower extremity excluding foot 10/18/2020  . Abscess of right leg 10/18/2020  . Dehydration 10/03/2020  . Leukocytosis 10/03/2020  . Thrombocytosis 10/03/2020  . Hyponatremia 10/03/2020  . Hypochloremia 10/03/2020  . Failure to thrive in adult 10/03/2020  . Acute renal failure superimposed on stage 3b chronic kidney disease ([place]) 10/03/2020  . Uncontrolled type 2 diabetes mellitus with hyperglycemia, without long-term current use of insulin ([place]) 10/03/2020  . Acute kidney injury superimposed on CKD ([place]) 10/02/2020  . Chronic kidney disease 10/01/2020  . 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 Chronic insomnia 01/08/2020  . Ocular myasthenia gravis ([place]) 09/18/2019  . Peripheral edema 01/23/2019  . Asthma 05/14/2015  . Heel spur 02/24/2015  . Depression 02/24/2015  . Post-menopausal bleeding 06/02/2014  . OA (osteoarthritis) of knee 04/09/2014  . Epidermal cyst 10/01/2013  . Class 3 obesity 02/25/2013  . Back pain 03/04/2012  . Gout 01/25/2012  . Edema 01/28/2011  . Obesity hypoventilation syndrome ([place]) 06/16/2008  . OBSTRUCTIVE SLEEP APNEA 05/08/2008  . Hyperlipidemia 08/28/2006  . Anxiety 08/28/2006  . Essential hypertension 08/28/2006  . GERD 08/28/2006  . Osteoarthritis 08/28/2006    Rayetta Humphrey,  PT CLT [phone] 11/20/2020, 4:16 PM  [facility] [address] [place], [place], [address] Phone: [phone]   Fax:  [phone]  Name: [name] MRN: [id number] Date of Birth: 1950/08/30

## 2020-11-21 ENCOUNTER — Other Ambulatory Visit: Payer: Self-pay

## 2020-11-21 ENCOUNTER — Emergency Department (HOSPITAL_COMMUNITY): Payer: Medicare Other

## 2020-11-21 ENCOUNTER — Encounter (HOSPITAL_COMMUNITY): Payer: Self-pay

## 2020-11-21 ENCOUNTER — Emergency Department (HOSPITAL_COMMUNITY)
Admission: EM | Admit: 2020-11-21 | Discharge: 2020-11-21 | Disposition: A | Discharge status: Home / Self Care | Payer: Medicare Other | Attending: Emergency Medicine | Admitting: Emergency Medicine

## 2020-11-21 DIAGNOSIS — X58XXXD Exposure to other specified factors, subsequent encounter: Secondary | ICD-10-CM | POA: Diagnosis not present

## 2020-11-21 DIAGNOSIS — J45909 Unspecified asthma, uncomplicated: Secondary | ICD-10-CM | POA: Diagnosis not present

## 2020-11-21 DIAGNOSIS — R600 Localized edema: Secondary | ICD-10-CM | POA: Diagnosis not present

## 2020-11-21 DIAGNOSIS — L02415 Cutaneous abscess of right lower limb: Secondary | ICD-10-CM | POA: Insufficient documentation

## 2020-11-21 DIAGNOSIS — Z7984 Long term (current) use of oral hypoglycemic drugs: Secondary | ICD-10-CM | POA: Diagnosis not present

## 2020-11-21 DIAGNOSIS — N1832 Chronic kidney disease, stage 3b: Secondary | ICD-10-CM | POA: Insufficient documentation

## 2020-11-21 DIAGNOSIS — I10 Essential (primary) hypertension: Secondary | ICD-10-CM | POA: Diagnosis not present

## 2020-11-21 DIAGNOSIS — Z7982 Long term (current) use of aspirin: Secondary | ICD-10-CM | POA: Insufficient documentation

## 2020-11-21 DIAGNOSIS — Z794 Long term (current) use of insulin: Secondary | ICD-10-CM | POA: Diagnosis not present

## 2020-11-21 DIAGNOSIS — S8992XD Unspecified injury of left lower leg, subsequent encounter: Secondary | ICD-10-CM | POA: Diagnosis present

## 2020-11-21 DIAGNOSIS — Z87891 Personal history of nicotine dependence: Secondary | ICD-10-CM | POA: Diagnosis not present

## 2020-11-21 DIAGNOSIS — S81832D Puncture wound without foreign body, left lower leg, subsequent encounter: Secondary | ICD-10-CM | POA: Diagnosis not present

## 2020-11-21 DIAGNOSIS — Z7951 Long term (current) use of inhaled steroids: Secondary | ICD-10-CM | POA: Diagnosis not present

## 2020-11-21 DIAGNOSIS — E1122 Type 2 diabetes mellitus with diabetic chronic kidney disease: Secondary | ICD-10-CM | POA: Diagnosis not present

## 2020-11-21 DIAGNOSIS — I129 Hypertensive chronic kidney disease with stage 1 through stage 4 chronic kidney disease, or unspecified chronic kidney disease: Secondary | ICD-10-CM | POA: Insufficient documentation

## 2020-11-21 DIAGNOSIS — Z5189 Encounter for other specified aftercare: Secondary | ICD-10-CM

## 2020-11-21 DIAGNOSIS — S81802A Unspecified open wound, left lower leg, initial encounter: Secondary | ICD-10-CM | POA: Diagnosis not present

## 2020-11-21 DIAGNOSIS — M7989 Other specified soft tissue disorders: Secondary | ICD-10-CM | POA: Diagnosis not present

## 2020-11-21 LAB — CBC WITH DIFFERENTIAL/PLATELET
Abs Immature Granulocytes: 0.02 10*3/uL (ref 0.00–0.07)
Basophils Absolute: 0.1 10*3/uL (ref 0.0–0.1)
Basophils Relative: 1 %
Eosinophils Absolute: 0.2 10*3/uL (ref 0.0–0.5)
Eosinophils Relative: 3 %
HCT: 31.6 % — ABNORMAL LOW (ref 36.0–46.0)
Hemoglobin: 10.1 g/dL — ABNORMAL LOW (ref 12.0–15.0)
Immature Granulocytes: 0 %
Lymphocytes Relative: 28 %
Lymphs Abs: 2.1 10*3/uL (ref 0.7–4.0)
MCH: 30 pg (ref 26.0–34.0)
MCHC: 32 g/dL (ref 30.0–36.0)
MCV: 93.8 fL (ref 80.0–100.0)
Monocytes Absolute: 1 10*3/uL (ref 0.1–1.0)
Monocytes Relative: 13 %
Neutro Abs: 4.2 10*3/uL (ref 1.7–7.7)
Neutrophils Relative %: 55 %
Platelets: 354 10*3/uL (ref 150–400)
RBC: 3.37 MIL/uL — ABNORMAL LOW (ref 3.87–5.11)
RDW: 16.8 % — ABNORMAL HIGH (ref 11.5–15.5)
WBC: 7.6 10*3/uL (ref 4.0–10.5)
nRBC: 0 % (ref 0.0–0.2)

## 2020-11-21 LAB — COMPREHENSIVE METABOLIC PANEL
ALT: 11 U/L (ref 0–44)
AST: 18 U/L (ref 15–41)
Albumin: 2.9 g/dL — ABNORMAL LOW (ref 3.5–5.0)
Alkaline Phosphatase: 100 U/L (ref 38–126)
Anion gap: 7 (ref 5–15)
BUN: 11 mg/dL (ref 8–23)
CO2: 23 mmol/L (ref 22–32)
Calcium: 8.4 mg/dL — ABNORMAL LOW (ref 8.9–10.3)
Chloride: 107 mmol/L (ref 98–111)
Creatinine, Ser: 1.33 mg/dL — ABNORMAL HIGH (ref 0.44–1.00)
GFR, Estimated: 43 mL/min — ABNORMAL LOW (ref >60)
Glucose, Bld: 106 mg/dL — ABNORMAL HIGH (ref 70–99)
Potassium: 4.1 mmol/L (ref 3.5–5.1)
Sodium: 137 mmol/L (ref 135–145)
Total Bilirubin: 0.6 mg/dL (ref 0.3–1.2)
Total Protein: 6.2 g/dL — ABNORMAL LOW (ref 6.5–8.1)

## 2020-11-21 LAB — BRAIN NATRIURETIC PEPTIDE: B Natriuretic Peptide: 17 pg/mL (ref 0.0–100.0)

## 2020-11-21 LAB — LACTIC ACID, PLASMA
Lactic Acid, Venous: 1.2 mmol/L (ref 0.5–1.9)
Lactic Acid, Venous: 0.8 mmol/L (ref 0.5–1.9)

## 2020-11-21 NOTE — ED Triage Notes (Signed)
Pt to er, pt states that she was seen by wound care yesterday and they wanted her to come to the er to get some abx for her wounds and also to be seen because her legs were draining fluid.

## 2020-11-21 NOTE — ED Triage Notes (Signed)
Emergency Medicine Provider Triage Evaluation Note  TATYANA BIBER , a 70 y.o. female  was evaluated in triage.  Pt complains of infection to lower extremities, legs are currently bandaged, was advised to come to the emergency room for evaluation and antibiotics.  Review of Systems  Positive: Leg infection Negative: Fever  Physical Exam  BP (!) 137/57 (BP Location: Left Arm)   Pulse 81   Temp 98.3 F (36.8 C) (Oral)   Resp 18   Ht 5' 4.5" (1.638 m)   Wt 118.8 kg   SpO2 99%   BMI 44.28 kg/m  Gen:   Awake, no distress   HEENT:  Atraumatic  Resp:  Normal effort  Cardiac:  Normal rate  Abd:   Nondistended, nontender  MSK:   Moves extremities without difficulty, bandages to bilateral lower extremities, unable to remove in triage, will plan for further evaluation once patient is roomed. Neuro:  Speech clear   Medical Decision Making  Medically screening exam initiated at 12:50 PM.  Appropriate orders placed.  Kevina H Mcroy was informed that the remainder of the evaluation will be completed by another provider, this initial triage assessment does not replace that evaluation, and the importance of remaining in the ED until their evaluation is complete.  Clinical Impression     Tacy Learn, PA-C 11/21/20 1250

## 2020-11-21 NOTE — Discharge Instructions (Addendum)
Your lab work and exam look reassuring.  I do not see any signs infection of your lower legs, they appear to be healing well.  Please continue with your current treatment.  The fluid leaking from your leg I suspect is from your chronic lymphedema.  Please continue with your medications as prescribed.  Please follow-up with your PCP for reevaluation of legs in 1 week's time.  Come back to the emergency department if you develop chest pain, shortness of breath, severe abdominal pain, uncontrolled nausea, vomiting, diarrhea.

## 2020-11-21 NOTE — ED Provider Notes (Signed)
[facility] EMERGENCY DEPARTMENT Provider Note   CSN: [id number] Arrival date & time: 11/21/20  1133     History Chief Complaint  Patient presents with  . Wound Check    [name] is a 70 y.o. female.  HPI   Patient with significant medical history of hypertension, myasthenia gravis, obesity, OSA, lower down the edema recent hospitalization at the end of March due to abscess of the right lower leg, I&D and was placed on IV antibiotics presents with chief complaint of wound check.  Patient states since Monday her left lower leg has been draining more than usual, states the drainage is clear, denies purulent discharge, denies worsening redness or edema around the area, states the pain has been about the same, denies paresthesia or weakness that leg, she denies  recent trauma.  She denies systemic infection like fevers or chills.  Patient was seen at her wound care nurse and they are concerned for possible infection and they wanted her to be evaluated in the emergency department.  Patient has no complaints this time, she denies chest pain, shortness of breath, orthopnea, worsening pedal edema, she denies abdominal pain, nausea, vomit, diarrhea, urinary symptoms.  Past Medical History:  Diagnosis Date  . Achilles tendinitis   . Anxiety   . Asthma   . Depression   . GERD (gastroesophageal reflux disease)   . Gout   . H/O myasthenia gravis    left eye  . HTN (hypertension)   . Hyperlipidemia   . Low back pain   . Obesity hypoventilation syndrome ([place])   . Obstructive sleep apnea   . Osteoarthritis     Patient Active Problem List   Diagnosis Date Noted  . Abscess of right lower extremity excluding foot 10/18/2020  . Abscess of right leg 10/18/2020  . Dehydration 10/03/2020  . Leukocytosis 10/03/2020  . Thrombocytosis 10/03/2020  . Hyponatremia 10/03/2020  . Hypochloremia 10/03/2020  . Failure to thrive in adult 10/03/2020  . Acute renal failure superimposed on stage  3b chronic kidney disease ([place]) 10/03/2020  . Uncontrolled type 2 diabetes mellitus with hyperglycemia, without long-term current use of insulin ([place]) 10/03/2020  . Acute kidney injury superimposed on CKD ([place]) 10/02/2020  . Chronic kidney disease 10/01/2020  . 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 Chronic insomnia 01/08/2020  . Ocular myasthenia gravis ([place]) 09/18/2019  . Peripheral edema 01/23/2019  . Asthma 05/14/2015  . Heel spur 02/24/2015  . Depression 02/24/2015  . Post-menopausal bleeding 06/02/2014  . OA (osteoarthritis) of knee 04/09/2014  . Epidermal cyst 10/01/2013  . Class 3 obesity 02/25/2013  . Back pain 03/04/2012  . Gout 01/25/2012  . Edema 01/28/2011  . Obesity hypoventilation syndrome ([place]) 06/16/2008  . OBSTRUCTIVE SLEEP APNEA 05/08/2008  . Hyperlipidemia 08/28/2006  . Anxiety 08/28/2006  . Essential hypertension 08/28/2006  . GERD 08/28/2006  . Osteoarthritis 08/28/2006    Past Surgical History:  Procedure Laterality Date  . CATARACT EXTRACTION W/PHACO Left 09/17/2015   Procedure: CATARACT EXTRACTION PHACO AND INTRAOCULAR LENS PLACEMENT LEFT EYE cde=8.58;  Surgeon: Tonny Branch, MD;  Location: [facility] ORS;  Service: Ophthalmology;  Laterality: Left;  . CATARACT EXTRACTION W/PHACO Right 05/15/2017   Procedure: CATARACT EXTRACTION PHACO AND INTRAOCULAR LENS PLACEMENT (IOC);  Surgeon: Tonny Branch, MD;  Location: [facility] ORS;  Service: Ophthalmology;  Laterality: Right;  CDE: 6.34  . COLONOSCOPY N/A 12/25/2013   Procedure: COLONOSCOPY;  Surgeon: Rogene Houston, MD;  Location: [facility] ENDO SUITE;  Service: Endoscopy;  Laterality: N/A;  730-rescheduled Ann notified pt  . COLONOSCOPY WITH PROPOFOL N/A 10/04/2019   Procedure: COLONOSCOPY WITH PROPOFOL;  Surgeon: Rogene Houston, MD;  Location: [facility] ENDO SUITE;  Service: Endoscopy;  Laterality: N/A;  815  . CYST EXCISION N/A 09/12/2016   Procedure: EXCISION SEBACEOUS CYST, BACK;  Surgeon: Aviva Signs, MD;  Location: [facility]  ORS;  Service: General;  Laterality: N/A;  . INTRAOCULAR LENS INSERTION Bilateral 2018   Dr. Tonny Branch   . POLYPECTOMY  10/04/2019   Procedure: POLYPECTOMY;  Surgeon: Rogene Houston, MD;  Location: [facility] END[facility] SU[facility];  Service: Endoscopy;;  . TUBAL LIGATION       OB History    Gravida  3   Para  3   Term  3   Preterm      AB      Living  3     SAB      IAB      Ectopic      Multiple      Live Births              Family History  Problem Relation Age of Onset  . Heart disease Mother   . Thyroid disease Mother   . Heart failure Father   . Lung disease Father   . Diabetes Brother   . Crohn's disease Daughter     Social History   Tobacco Use  . Smoking status: Former Smoker    Packs/day: 2.00    Years: 36.00    Pack years: 72.00    Types: Cigarettes    Start date: 06/25/1969    Quit date: 03/25/2005    Years since quitting: 15.6  . Smokeless tobacco: Never Used  Vaping Use  . Vaping Use: Never used  Substance Use Topics  . Alcohol use: No    Alcohol/week: 0.0 standard drinks  . Drug use: No    Home Medications Prior to Admission medications   Medication Sig Start Date End Date Taking? Authorizing Provider  acetaminophen (TYLENOL) 500 MG tablet Take 1,000 mg by mouth every 6 (six) hours as needed for mild pain.     [provider]  albuterol (VENTOLIN HFA) 108 (90 Base) MCG/ACT inhaler INHALE 2 PUFFS INTO THE LUNGS EVERY 4 HOURS AS NEEDED FOR WHEEZING OR SHORTNESS OF BREATH Patient taking differently: Inhale 2 puffs into the lungs every 4 (four) hours as needed for wheezing or shortness of breath. 01/23/20   [place], Modena Nunnery, MD  allopurinol (ZYLOPRIM) 100 MG tablet TAKE 1 TABLET(100 MG) BY MOUTH DAILY Patient taking differently: Take 100 mg by mouth daily. 02/28/20   Alycia Rossetti, MD  aspirin EC 81 MG tablet Take 1 tablet (81 mg total) by mouth every evening. 10/05/19   Rehman, Mechele Dawley, MD  atorvastatin (LIPITOR) 40 MG tablet TAKE 1  TABLET(40 MG) BY MOUTH DAILY AT 6 PM Patient taking differently: Take 40 mg by mouth daily. 08/31/20   Alycia Rossetti, MD  Blood Glucose Monitoring Suppl (BLOOD GLUCOSE SYSTEM PAK) KIT Use as directed to monitor FSBS 3x weekly. Dx: R73.09. 05/25/20   Alycia Rossetti, MD  busPIRone (BUSPAR) 5 MG tablet TAKE 1 TABLET(5 MG) BY MOUTH TWICE DAILY FOR ANXIETY Patient taking differently: Take 5 mg by mouth 2 (two) times daily. 10/16/20   Alycia Rossetti, MD  clotrimazole (LOTRIMIN) 1 % cream Apply 1 application topically 2 (two) times daily. 01/08/20   Alycia Rossetti, MD  diphenhydrAMINE (BENADRYL) 25 MG tablet Take 25  mg by mouth daily.    [provider]  fluticasone (CUTIVATE) 0.05 % cream APPLY EXTERNALLY TO THE AFFECTED AREA DAILY 08/13/18   Alycia Rossetti, MD  furosemide (LASIX) 40 MG tablet TAKE 1 TABLET(40 MG) BY MOUTH DAILY Patient taking differently: Take 40 mg by mouth daily. 09/08/20   [place]kins, Modena Nunnery, MD  Glucose Blood (BLOOD GLUCOSE TEST STRIPS) STRP Use as directed to monitor FSBS 3x weekly. Dx: R73.09. 05/25/20   Alycia Rossetti, MD  HYDROcodone-acetaminophen (NORCO) 7.5-325 MG tablet Take 1 tablet by mouth 2 (two) times daily as needed for moderate pain. 09/30/20   Alycia Rossetti, MD  Lancets MISC Use as directed to monitor FSBS 3x weekly. Dx: R73.09. 05/25/20   Alycia Rossetti, MD  Magnesium 250 MG TABS Take 1 tablet (250 mg total) by mouth daily. Patient taking differently: Take 250 mg by mouth daily in the afternoon. Midday 08/16/18   [place]ett, Modena Nunnery, MD  metFORMIN (GLUCOPHAGE) 500 MG tablet Take 1 tablet (500 mg total) by mouth 2 (two) times daily with a meal. 08/12/20   , Modena Nunnery, MD  nystatin (MYCOSTATIN/NYSTOP) powder Apply 1 application topically 3 (three) times daily. Patient not taking: Reported on 11/08/2020 01/08/20   Alycia Rossetti, MD  omeprazole (PRILOSEC) 40 MG capsule TAKE 1 CAPSULE(40 MG) BY MOUTH DAILY Patient taking differently: Take 40 mg  by mouth daily. 06/29/20   Alycia Rossetti, MD  OXYGEN Inhale 1 L into the lungs at bedtime. IN ADDITION TO CPAP NIGHTLY    [provider]  polyethylene glycol powder (MIRALAX) 17 GM/SCOOP powder Mix 1 scoop (17 grams) of powder in water daily to help with constipation. 11/08/20   Alroy Bailiff, Margaux, PA-C  potassium chloride (KLOR-CON) 10 MEQ tablet TAKE 1 TABLET BY MOUTH DAILY WITH FLUID PILL Patient taking differently: Take 10 mEq by mouth daily. With fluid pill 09/01/20   Alycia Rossetti, MD  predniSONE (DELTASONE) 10 MG tablet Pt currently on 63m daily, start taking 521mdaily for one month, then decrease to 4057maily for 1 month, then decrease 63m34mily. Patient not taking: Reported on 11/08/2020 07/16/20   SateBritt Bottom  pyridostigmine (MESTINON) 180 MG CR tablet Take 180 mg by mouth daily. Patient not taking: Reported on 11/08/2020 06/27/20   [provider]  traZODone (DESYREL) 100 MG tablet TAKE 1 TABLET BY MOUTH AT BEDTIME AS NEEDED FOR SLEEP Patient taking differently: Take 100 mg by mouth at bedtime as needed for sleep. 07/07/20   DurhAlycia Rossetti    Allergies    Patient has no known allergies.  Review of Systems   Review of Systems  Constitutional: Negative for chills and fever.  HENT: Negative for congestion.   Respiratory: Negative for shortness of breath.   Cardiovascular: Negative for chest pain.  Gastrointestinal: Negative for abdominal pain, diarrhea, nausea and vomiting.  Genitourinary: Negative for dysuria and enuresis.  Musculoskeletal: Negative for back pain.  Skin: Negative for rash.       Worsening leg drainage left leg  Neurological: Negative for headaches.  Hematological: Does not bruise/bleed easily.    Physical Exam Updated Vital Signs BP 132/78   Pulse 94   Temp 98.3 F (36.8 C) (Oral)   Resp (!) 22   Ht 5' 4.5" (1.638 m)   Wt 118.8 kg   SpO2 98%   BMI 44.28 kg/m   Physical Exam Vitals and nursing note reviewed.   Constitutional:  General: She is not in acute distress.    Appearance: She is not ill-appearing.  HENT:     Head: Normocephalic and atraumatic.     Nose: No congestion.  Eyes:     Conjunctiva/sclera: Conjunctivae normal.  Cardiovascular:     Rate and Rhythm: Normal rate and regular rhythm.     Pulses: Normal pulses.     Heart sounds: No murmur heard. No friction rub. No gallop.   Pulmonary:     Effort: No respiratory distress.     Breath sounds: No wheezing, rhonchi or rales.  Abdominal:     Palpations: Abdomen is soft.     Tenderness: There is no abdominal tenderness.  Musculoskeletal:     Right lower leg: Edema present.     Left lower leg: Edema present.     Comments: Patient's lower extremities were visualized she has noted edema 2+ pitting up to the shins, with vascular changes, she has noted skin breakdown on the left leg 3 patches measuring 2 to 4 cm in diameter, no surrounding erythema, no purulent discharge noted, area was not warm to the touch, no fluctuance or induration present, neurovascular intact.  Left leg had a small area of skin breakdown measuring 1 cm in diameter, no surrounding erythema, no drainage or discharge present, no fluctuant areas noted.  Neurovascularly intact  Skin:    General: Skin is warm and dry.  Neurological:     Mental Status: She is alert.  Psychiatric:        Mood and Affect: Mood normal.                ED Results / Procedures / Treatments   Labs (all labs ordered are listed, but only abnormal results are displayed) Labs Reviewed  COMPREHENSIVE METABOLIC PANEL - Abnormal; Notable for the following components:      Result Value   Glucose, Bld 106 (*)    Creatinine, Ser 1.33 (*)    Calcium 8.4 (*)    Total Protein 6.2 (*)    Albumin 2.9 (*)    GFR, Estimated 43 (*)    All other components within normal limits  CBC WITH DIFFERENTIAL/PLATELET - Abnormal; Notable for the following components:   RBC 3.37 (*)    Hemoglobin  10.1 (*)    HCT 31.6 (*)    RDW 16.8 (*)    All other components within normal limits  BRAIN NATRIURETIC PEPTIDE  LACTIC ACID, PLASMA  LACTIC ACID, PLASMA    EKG None  Radiology DG Chest Port 1 View  Result Date: 11/21/2020 CLINICAL DATA:  Leg swelling, leg pain. EXAM: PORTABLE CHEST 1 VIEW COMPARISON:  Chest x-rays dated 10/02/2020 and 09/23/2020. FINDINGS: Heart size and mediastinal contours are stable. Lungs are clear. No pleural effusion or pneumothorax is seen. Osseous structures about the chest are unremarkable. IMPRESSION: No active disease.  No evidence of pneumonia or pulmonary edema. Electronically Signed   By: Franki Cabot M.D.   On: 11/21/2020 14:49    Procedures Procedures   Medications Ordered in ED Medications - No data to display  ED Course  I have reviewed the triage vital signs and the nursing notes.  Pertinent labs & imaging results that were available during my care of the patient were reviewed by me and considered in my medical decision making (see chart for details).    MDM Rules/Calculators/A&P  Initial impression-patient presents with a wound check.  She is alert, does not appear in acute distress, vital signs reassuring.  Concern for possible fluid overloaded, will obtain basic lab work-up, BNP, CMP, chest x-ray, EKG and reassess.  Work-up-CBC shows normocytic anemia appears to be baseline for patient.  CMP shows hyperglycemia 106, creatinine 1.33 appears to be a baseline for patient, hypoalbuminemia.  Palpation of the patient, lactic 1.2, BNP 17.  Chest x-ray negative for acute findings.  EKG sinus without signs of ischemia no ST elevation depression noted.  Rule out-low suspicion for CHF exacerbation as lung sounds are clear bilaterally, BMP within normal limits, chest x-ray negative for acute findings.  Patient does have noted lower limb edema I suspect this is chronic from lymphedema.  Low suspicion for portal hypertension  and/or cirrhosis as patient has no right upper quadrant pain, denies alcohol use, liver enzymes are within normal limits.  Low suspicion for DVT as patient has bilateral edema, no unilateral leg swelling noted, this is chronic, no calf pain noted my exam.  Low suspicion for cellulitis or deep tissue infection as there is no sign infection on my exam, no fluctuant indurations present.  There is no white count and vital signs are reassuring.  Plan-  1.  Wound check-appears wounds on patient's legs are healing well, no signs of infection, will defer antibiotic treatment at this time.  Suspect clear drainage secondary due to lymphedema possible causes are hypoalbuminemia versus lymphedema versus CHF versus vascular abnormality.  Will recommend she continue with her current treatment, follow-up with PCP for further evaluation.  Vital signs have remained stable, no indication for hospital admission.  Patient discussed with attending and they agreed with assessment and plan.  Patient given at home care as well strict return precautions.  Patient verbalized that they understood agreed to said plan.   Final Clinical Impression(s) / ED Diagnoses Final diagnoses:  Visit for wound check    Rx / DC Orders ED Discharge Orders    None       Marcello Fennel, PA-C 11/21/20 1556    Elnora Morrison, MD 11/21/20 1601

## 2020-11-23 ENCOUNTER — Ambulatory Visit (HOSPITAL_COMMUNITY): Payer: Medicare Other | Attending: Internal Medicine | Admitting: Physical Therapy

## 2020-11-23 ENCOUNTER — Other Ambulatory Visit: Payer: Self-pay

## 2020-11-23 DIAGNOSIS — I890 Lymphedema, not elsewhere classified: Secondary | ICD-10-CM | POA: Diagnosis present

## 2020-11-23 DIAGNOSIS — L02415 Cutaneous abscess of right lower limb: Secondary | ICD-10-CM | POA: Diagnosis present

## 2020-11-23 DIAGNOSIS — R262 Difficulty in walking, not elsewhere classified: Secondary | ICD-10-CM | POA: Diagnosis present

## 2020-11-23 DIAGNOSIS — R600 Localized edema: Secondary | ICD-10-CM | POA: Diagnosis present

## 2020-11-23 DIAGNOSIS — R601 Generalized edema: Secondary | ICD-10-CM | POA: Insufficient documentation

## 2020-11-23 NOTE — Therapy (Signed)
[facility], [place], [address] Phone: [phone]   Fax:  [phone]  Wound Care Therapy  Patient Details  Name: [name] MRN: [id number] Date of Birth: Jan 01, 1951 No data recorded  Encounter Date: 11/23/2020   PT End of Session - 11/23/20 1048    Visit Number 13    Number of Visits 20    Date for PT Re-Evaluation 12/04/20    Authorization Type [employer] Medicare (no visit limit, no auth)    Progress Note Due on Visit 18    PT Start Time 0915    PT Stop Time 1025    PT Time Calculation (min) 70 min    Activity Tolerance Patient tolerated treatment well    Behavior During Therapy [facility] for tasks assessed/performed           Past Medical History:  Diagnosis Date  . Achilles tendinitis   . Anxiety   . Asthma   . Depression   . GERD (gastroesophageal reflux disease)   . Gout   . H/O myasthenia gravis    left eye  . HTN (hypertension)   . Hyperlipidemia   . Low back pain   . Obesity hypoventilation syndrome ([place])   . Obstructive sleep apnea   . Osteoarthritis     Past Surgical History:  Procedure Laterality Date  . CATARACT EXTRACTION W/PHACO Left 09/17/2015   Procedure: CATARACT EXTRACTION PHACO AND INTRAOCULAR LENS PLACEMENT LEFT EYE cde=8.58;  Surgeon: Tonny Branch, MD;  Location: [facility] OR[facility];  Service: Ophthalmology;  Laterality: Left;  . CATARACT EXTRACTION W/PHACO Right 05/15/2017   Procedure: CATARACT EXTRACTION PHACO AND INTRAOCULAR LENS PLACEMENT (IOC);  Surgeon: Tonny Branch, MD;  Location: [facility];  Service: Ophthalmology;  Laterality: Right;  CDE: 6.34  . COLONOSCOPY N/A 12/25/2013   Procedure: COLONOSCOPY;  Surgeon: Rogene Houston, MD;  Location: [facility];  Service: Endoscopy;  Laterality: N/A;  730-rescheduled Ann notified pt  . COLONOSCOPY WITH PROPOFOL N/A 10/04/2019   Procedure: COLONOSCOPY WITH PROPOFOL;  Surgeon: Rogene Houston, MD;  Location: [facility];  Service: Endoscopy;   Laterality: N/A;  815  . CYST EXCISION N/A 09/12/2016   Procedure: EXCISION SEBACEOUS CYST, BACK;  Surgeon: Aviva Signs, MD;  Location: [facility];  Service: General;  Laterality: N/A;  . INTRAOCULAR LENS INSERTION Bilateral 2018   Dr. Tonny Branch   . POLYPECTOMY  10/04/2019   Procedure: POLYPECTOMY;  Surgeon: Rogene Houston, MD;  Location: [facility];  Service: Endoscopy;;  . TUBAL LIGATION      There were no vitals filed for this visit.    Subjective Assessment - 11/23/20 1028    Subjective pt states she went to the ED.  States they threw away her short stretch bandaging for Rt LE, rebandaged with ACE bandaging and did not give her any antibiotics.    Currently in Pain? Yes    Pain Score 10-Worst pain ever    Pain Location Leg    Pain Orientation Left    Pain Descriptors / Indicators Aching;Burning                     Wound Therapy - 11/23/20 1028    Subjective see above; states pain 10/10 with new onset of Lt heel pain as well.    Pressure Injury Properties Date First Assessed: 11/23/20 Time First Assessed: 1000 Location: Heel Location Orientation: Left Staging: Stage 1 -  Intact skin with non-blanchable redness  of a localized area usually over a bony prominence. Wound Description (Comments): Lt heel Present on Admission: No   Wound Image View All Images View Images    Dressing Type Compression wrap    Dressing Dry;Clean    Peri-wound Assessment Intact    Wound Length (cm) 4 cm    Wound Width (cm) 5 cm    Wound Depth (cm) 0 cm    Wound Surface Area (cm^2) 20 cm^2    Wound Volume (cm^3) 0 cm^3    Drainage Amount None    Treatment Off loading    Wound Properties Date First Assessed: 11/16/20 Time First Assessed: 1050 Wound Type: Other (Comment) Location: Pretibial Location Orientation: Left Wound Description (Comments): multiple blisters on anterior, lateral, posterior and medial aspect of Lt LE Present on Admission: No   Dressing Type Impregnated gauze (bismuth)     Dressing Changed Changed    Dressing Change Frequency PRN    Site / Wound Assessment Red;Pink    % Wound base Red or Granulating 100%    Peri-wound Assessment Intact    Drainage Amount Copious    Drainage Description Green;Purulent    Treatment Cleansed;Debridement (Selective)    Selective Debridement - Location Lt LE blistered borders    Selective Debridement - Tools Used Forceps    Selective Debridement - Tissue Removed devitalized skin    Wound Therapy - Clinical Statement Dressings soaked through with pseudomonas drainage and continued to drip heavily during session while cleansing and debriding.  Noted pressure ulcer plantar Lt heel that was documented.  Heel has no broken skin/open wound or drainage.  Cleansed, debrided, moisturzied and dressed both LE's with profore this session.  new 1/2" foam was cut for bil LE's as well.  Two small areas still draining/open medial Rt LE as well.  Used calcium alginate and extral ABD padding on lt LE    Wound Plan Continue with appropriate woundcare.  Transition to compression stockings when able.    Dressing  Lt: silver alginate, calcium alginate, ABD, 1/2" foam, profore    Dressing Rt: xeroform, silver alginate, 4X4, 1/2" foam, profore                   PT Education - 11/23/20 1050    Education Details Educated on mechanism of Lt heel injury; importance of floating heels with demonstration and urged to get hospital bed for sleeping at night as she is currently sleeping on couch or recliner.    Person(s) Educated Patient    Methods Explanation;Demonstration;Tactile cues;Verbal cues    Comprehension Verbalized understanding            PT Short Term Goals - 11/18/20 1714      PT SHORT TERM GOAL #1   Title Patient will be independent in self management strategies in order to reduce the risk of wound reoccurance.    Time 3    Period Weeks    Status Achieved    Target Date 11/13/20             PT Long Term Goals - 11/18/20 1714       PT LONG TERM GOAL #1   Title Patient will be able to don/doff compression garments independently in order to manage LE edema.    Time 6    Period Weeks    Status On-going      PT LONG TERM GOAL #2   Title PT to understand and verbalize that she needs to wear her compression garment  and pump on a daily basis at D/C or edema will return    Time 6    Period Weeks    Status Achieved      PT LONG TERM GOAL #3   Title Patient will be able to wear her shoes again for improved ambulation ability.    Time 6    Period Weeks    Status On-going      PT LONG TERM GOAL #4   Title PT to have lost 6-8 cm in LE; 4 cm in foot to reduce risk of cellulitis    Time 5    Period Weeks    Status Achieved                  Patient will benefit from skilled therapeutic intervention in order to improve the following deficits and impairments:     Visit Diagnosis: Generalized edema  Lymphedema, not elsewhere classified  Abscess of right leg  Abscess of right lower extremity excluding foot  Bilateral lower extremity edema  Difficulty in walking, not elsewhere classified     Problem List Patient Active Problem List   Diagnosis Date Noted  . Abscess of right lower extremity excluding foot 10/18/2020  . Abscess of right leg 10/18/2020  . Dehydration 10/03/2020  . Leukocytosis 10/03/2020  . Thrombocytosis 10/03/2020  . Hyponatremia 10/03/2020  . Hypochloremia 10/03/2020  . Failure to thrive in adult 10/03/2020  . Acute renal failure superimposed on stage 3b chronic kidney disease ([place]) 10/03/2020  . Uncontrolled type 2 diabetes mellitus with hyperglycemia, without long-term current use of insulin ([place]) 10/03/2020  . Acute kidney injury superimposed on CKD ([place]) 10/02/2020  . Chronic kidney disease 10/01/2020  . 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 Chronic insomnia 01/08/2020  . Ocular myasthenia gravis ([place]) 09/18/2019  . Peripheral edema 01/23/2019  .  Asthma 05/14/2015  . Heel spur 02/24/2015  . Depression 02/24/2015  . Post-menopausal bleeding 06/02/2014  . OA (osteoarthritis) of knee 04/09/2014  . Epidermal cyst 10/01/2013  . Class 3 obesity 02/25/2013  . Back pain 03/04/2012  . Gout 01/25/2012  . Edema 01/28/2011  . Obesity hypoventilation syndrome ([place]) 06/16/2008  . OBSTRUCTIVE SLEEP APNEA 05/08/2008  . Hyperlipidemia 08/28/2006  . Anxiety 08/28/2006  . Essential hypertension 08/28/2006  . GERD 08/28/2006  . Osteoarthritis 08/28/2006   Teena Irani, PTA/CLT [phone]  Teena Irani 11/23/2020, 10:51 AM  [facility] [address] [place], [place], [address] Phone: [phone]   Fax:  [phone]  Name: [name] MRN: [id number] Date of Birth: 10/03/1950

## 2020-11-25 ENCOUNTER — Encounter (HOSPITAL_COMMUNITY): Payer: Self-pay

## 2020-11-25 ENCOUNTER — Other Ambulatory Visit: Payer: Self-pay

## 2020-11-25 ENCOUNTER — Ambulatory Visit (HOSPITAL_COMMUNITY): Payer: Medicare Other

## 2020-11-25 DIAGNOSIS — L02415 Cutaneous abscess of right lower limb: Secondary | ICD-10-CM

## 2020-11-25 DIAGNOSIS — R601 Generalized edema: Secondary | ICD-10-CM | POA: Diagnosis not present

## 2020-11-25 DIAGNOSIS — I890 Lymphedema, not elsewhere classified: Secondary | ICD-10-CM

## 2020-11-25 DIAGNOSIS — R600 Localized edema: Secondary | ICD-10-CM

## 2020-11-25 NOTE — Therapy (Signed)
[place], [place], [address] Phone: [phone]   Fax:  [phone]  Physical Therapy Treatment  Patient Details  Name: [name] MRN: [id number] Date of Birth: 1951/07/25 No data recorded  Encounter Date: 11/25/2020   PT End of Session - 11/25/20 1659    Visit Number 14    Number of Visits 20    Date for PT Re-Evaluation 12/04/20    Authorization Type UHC Medicare (no visit limit, no auth)    Progress Note Due on Visit 18    PT Start Time 1533    PT Stop Time 1645    PT Time Calculation (min) 72 min    Activity Tolerance Patient tolerated treatment well    Behavior During Therapy [facility] for tasks assessed/performed           Past Medical History:  Diagnosis Date  . Achilles tendinitis   . Anxiety   . Asthma   . Depression   . GERD (gastroesophageal reflux disease)   . Gout   . H/O myasthenia gravis    left eye  . HTN (hypertension)   . Hyperlipidemia   . Low back pain   . Obesity hypoventilation syndrome ([place])   . Obstructive sleep apnea   . Osteoarthritis     Past Surgical History:  Procedure Laterality Date  . CATARACT EXTRACTION W/PHACO Left 09/17/2015   Procedure: CATARACT EXTRACTION PHACO AND INTRAOCULAR LENS PLACEMENT LEFT EYE cde=8.58;  Surgeon: Tonny Branch, MD;  Location: [facility];  Service: Ophthalmology;  Laterality: Left;  . CATARACT EXTRACTION W/PHACO Right 05/15/2017   Procedure: CATARACT EXTRACTION PHACO AND INTRAOCULAR LENS PLACEMENT (IOC);  Surgeon: Tonny Branch, MD;  Location: [facility];  Service: Ophthalmology;  Laterality: Right;  CDE: 6.34  . COLONOSCOPY N/A 12/25/2013   Procedure: COLONOSCOPY;  Surgeon: Rogene Houston, MD;  Location: [facility];  Service: Endoscopy;  Laterality: N/A;  730-rescheduled Ann notified pt  . COLONOSCOPY WITH PROPOFOL N/A 10/04/2019   Procedure: COLONOSCOPY WITH PROPOFOL;  Surgeon: Rogene Houston, MD;  Location: [facility];  Service: Endoscopy;   Laterality: N/A;  815  . CYST EXCISION N/A 09/12/2016   Procedure: EXCISION SEBACEOUS CYST, BACK;  Surgeon: Aviva Signs, MD;  Location: [facility];  Service: General;  Laterality: N/A;  . INTRAOCULAR LENS INSERTION Bilateral 2018   Dr. Tonny Branch   . POLYPECTOMY  10/04/2019   Procedure: POLYPECTOMY;  Surgeon: Rogene Houston, MD;  Location: [facility];  Service: Endoscopy;;  . TUBAL LIGATION      There were no vitals filed for this visit.   Subjective Assessment - 11/25/20 1537    Subjective Pt arrived with dressings on, reports intermittent burning Lt LE.  Reports MD apt scheduled for 6/./22.    Pertinent History chronic edema in LE with wounds causing hospitalization, ( most likely lymphedema), gout, LBP, DM,    Currently in Pain? Yes    Pain Score 5     Pain Location Leg    Pain Orientation Left    Pain Descriptors / Indicators Aching;Burning                           Wound Therapy - 11/25/20 1537    Subjective Pt arrived with dressings on, reports intermittent burning Lt LE.  Reports MD apt scheduled for 6/./22.  Reports she called and placed order for compression stocking earlier today.  Patient and Family Stated Goals wound to heal    Date of Onset 07/11/21    Prior Treatments unna boots, hospitalizations    Pain Scale 0-10    Evaluation and Treatment Procedures Explained to Patient/Family Yes    Evaluation and Treatment Procedures agreed to    Pressure Injury Properties Date First Assessed: 11/23/20 Time First Assessed: 1000 Location: Heel Location Orientation: Left Staging: Stage 1 -  Intact skin with non-blanchable redness of a localized area usually over a bony prominence. Wound Description (Comments): Lt heel Present on Admission: No   Dressing Type Silver dressings;Compression wrap    Dressing Clean;Dry    Peri-wound Assessment Intact    Drainage Amount None    Wound Properties Date First Assessed: 11/16/20 Time First Assessed: 1050 Wound Type: Other  (Comment) Location: Pretibial Location Orientation: Left Wound Description (Comments): multiple blisters on anterior, lateral, posterior and medial aspect of Lt LE Present on Admission: No   Dressing Type Impregnated gauze (bismuth);Silver hydrofiber;Compression wrap    Dressing Changed Changed    Dressing Change Frequency PRN    Site / Wound Assessment Red;Pink    % Wound base Red or Granulating 100%    Peri-wound Assessment Intact    Drainage Amount Copious    Drainage Description Green;Sanguineous    Treatment Cleansed;Debridement (Selective)    Selective Debridement - Location Lt LE blistered borders    Selective Debridement - Tools Used Forceps    Selective Debridement - Tissue Removed devitalized skin    Wound Therapy - Clinical Statement Dressing soaked through dressings including foam wiht pseudomonas copious drainage.  1/2 in foam recut for Lt LE.  BLE cleansed well and debridement for slough and devitalized skin.  Rt LE may be ready to return to short stretch bandages next apt, did have 2 small openings wiht min drainage so continued with profore BLE.  No reports of increased pain through session.    Wound Plan Continue with appropriate woundcare.  Transition to compression stockings when able.    Dressing  Lt: silver alginate, calcium alginate, ABD, 1/2" foam, profore    Dressing Rt: xeroform, silver alginate, 4X4, 1/2" foam, profore                      PT Short Term Goals - 11/18/20 1714      PT SHORT TERM GOAL #1   Title Patient will be independent in self management strategies in order to reduce the risk of wound reoccurance.    Time 3    Period Weeks    Status Achieved    Target Date 11/13/20             PT Long Term Goals - 11/18/20 1714      PT LONG TERM GOAL #1   Title Patient will be able to don/doff compression garments independently in order to manage LE edema.    Time 6    Period Weeks    Status On-going      PT LONG TERM GOAL #2   Title  PT to understand and verbalize that she needs to wear her compression garment and pump on a daily basis at D/C or edema will return    Time 6    Period Weeks    Status Achieved      PT LONG TERM GOAL #3   Title Patient will be able to wear her shoes again for improved ambulation ability.    Time 6    Period  Weeks    Status On-going      PT LONG TERM GOAL #4   Title PT to have lost 6-8 cm in LE; 4 cm in foot to reduce risk of cellulitis    Time 5    Period Weeks    Status Achieved                  Patient will benefit from skilled therapeutic intervention in order to improve the following deficits and impairments:     Visit Diagnosis: Lymphedema, not elsewhere classified  Abscess of right leg  Abscess of right lower extremity excluding foot  Bilateral lower extremity edema     Problem List Patient Active Problem List   Diagnosis Date Noted  . Abscess of right lower extremity excluding foot 10/18/2020  . Abscess of right leg 10/18/2020  . Dehydration 10/03/2020  . Leukocytosis 10/03/2020  . Thrombocytosis 10/03/2020  . Hyponatremia 10/03/2020  . Hypochloremia 10/03/2020  . Failure to thrive in adult 10/03/2020  . Acute renal failure superimposed on stage 3b chronic kidney disease ([place]) 10/03/2020  . Uncontrolled type 2 diabetes mellitus with hyperglycemia, without long-term current use of insulin ([place]) 10/03/2020  . Acute kidney injury superimposed on CKD ([place]) 10/02/2020  . Chronic kidney disease 10/01/2020  . 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[place]) 05/12/2020  . Chronic insomnia 01/08/2020  . Ocular myasthenia gravis ([place]) 09/18/2019  . Peripheral edema 01/23/2019  . Asthma 05/14/2015  . Heel spur 02/24/2015  . Depression 02/24/2015  . Post-menopausal bleeding 06/02/2014  . OA (osteoarthritis) of knee 04/09/2014  . Epidermal cyst 10/01/2013  . Class 3 obesity 02/25/2013  . Back pain 03/04/2012  . Gout 01/25/2012  . Edema 01/28/2011  . Obesity  hypoventilation syndrome ([place]) 06/16/2008  . OBSTRUCTIVE SLEEP APNEA 05/08/2008  . Hyperlipidemia 08/28/2006  . Anxiety 08/28/2006  . Essential hypertension 08/28/2006  . GERD 08/28/2006  . Osteoarthritis 08/28/2006   Ihor Austin, LPTA/CLT; CBIS [phone]  Aldona Lento 11/25/2020, 5:16 PM  [facility] [address] [place], [place], [address] Phone: [phone]   Fax:  [phone]  Name: RUHI KOPKE MRN: [id number] Date of Birth: September 20, 1950

## 2020-11-27 ENCOUNTER — Encounter (HOSPITAL_COMMUNITY): Payer: Self-pay | Admitting: Physical Therapy

## 2020-11-27 ENCOUNTER — Other Ambulatory Visit: Payer: Self-pay

## 2020-11-27 ENCOUNTER — Ambulatory Visit (HOSPITAL_COMMUNITY): Payer: Medicare Other | Admitting: Physical Therapy

## 2020-11-27 DIAGNOSIS — R601 Generalized edema: Secondary | ICD-10-CM | POA: Diagnosis not present

## 2020-11-27 DIAGNOSIS — L02415 Cutaneous abscess of right lower limb: Secondary | ICD-10-CM

## 2020-11-27 DIAGNOSIS — I890 Lymphedema, not elsewhere classified: Secondary | ICD-10-CM

## 2020-11-27 NOTE — Therapy (Signed)
[place], [place], [address] Phone: [phone]   Fax:  [phone]  Wound Care Therapy  Patient Details  Name: [name] MRN: [id number] Date of Birth: [birth year] No data recorded  Encounter Date: 11/27/2020   PT End of Session - 11/27/20 1234    Visit Number 15    Number of Visits 20    Date for PT Re-Evaluation 12/04/20    Authorization Type UHC Medicare (no visit limit, no auth)    Progress Note Due on Visit 18    PT Start Time 1040    PT Stop Time 1140    PT Time Calculation (min) 60 min    Activity Tolerance Patient tolerated treatment well    Behavior During Therapy [facility] for tasks assessed/performed           Past Medical History:  Diagnosis Date  . Achilles tendinitis   . Anxiety   . Asthma   . Depression   . GERD (gastroesophageal reflux disease)   . Gout   . H/O myasthenia gravis    left eye  . HTN (hypertension)   . Hyperlipidemia   . Low back pain   . Obesity hypoventilation syndrome ([place])   . Obstructive sleep apnea   . Osteoarthritis     Past Surgical History:  Procedure Laterality Date  . CATARACT EXTRACTION W/PHACO Left 09/17/2015   Procedure: CATARACT EXTRACTION PHACO AND INTRAOCULAR LENS PLACEMENT LEFT EYE cde=8.58;  Surgeon: Tonny Branch, MD;  Location: [facility];  Service: Ophthalmology;  Laterality: Left;  . CATARACT EXTRACTION W/PHACO Right 05/15/2017   Procedure: CATARACT EXTRACTION PHACO AND INTRAOCULAR LENS PLACEMENT (IOC);  Surgeon: Tonny Branch, MD;  Location: [facility];  Service: Ophthalmology;  Laterality: Right;  CDE: 6.34  . COLONOSCOPY N/A 12/25/2013   Procedure: COLONOSCOPY;  Surgeon: Rogene Houston, MD;  [facility];  Service: Endoscopy;  Laterality: N/A;  730-rescheduled Ann notified pt  . COLONOSCOPY WITH PROPOFOL N/A 10/04/2019   Procedure: COLONOSCOPY WITH PROPOFOL;  Surgeon: Rogene Houston, MD;  [facility];  Service: Endoscopy;   Laterality: N/A;  815  . CYST EXCISION N/A 09/12/2016   Procedure: EXCISION SEBACEOUS CYST, BACK;  Surgeon: Aviva Signs, MD;  Location: [facility];  Service: General;  Laterality: N/A;  . INTRAOCULAR LENS INSERTION Bilateral 2018   Dr. Tonny Branch   . POLYPECTOMY  10/04/2019   Procedure: POLYPECTOMY;  Surgeon: Rogene Houston, MD;  [facility];  Service: Endoscopy;;  . TUBAL LIGATION      There were no vitals filed for this visit.         LYMPHEDEMA/ONCOLOGY QUESTIONNAIRE - 11/27/20 0001      Right Lower Extremity Lymphedema   30 cm Proximal to Floor at Lateral Plantar Foot 40.8 cm   was 41 initally was 48.5   20 cm Proximal to Floor at Lateral Plantar Foot 28.5 1   was 34.5, initally was 40.5   10 cm Proximal to Floor at Lateral Malleoli 27.8 cm   was 30.2 initally was 30   Circumference of ankle/heel 34.6 cm.    5 cm Proximal to 1st MTP Joint 24.5 cm    Across MTP Joint 24.8 cm      Left Lower Extremity Lymphedema   30 cm Proximal to Floor at Lateral Plantar Foot 46 cm   was 40 ; initially was 50.5   20 cm Proximal to Floor at Lateral Plantar Foot  36.8 cm   was 36.3 initally was 45   10 cm Proximal to Floor at Lateral Malleoli 30.3 cm   was 33  was initially 39   Circumference of ankle/heel 37 cm.    5 cm Proximal to 1st MTP Joint 25.4 cm    Across MTP Joint 25.4 cm                Wound Therapy - 11/27/20 0001    Subjective Pt state that her legs are feeling better.  States that her Lt heel hurts her . Pt has a pressure wound.  When questioned pt states she has a wheel chair at homes and will sometimes pull herself with her legs rather than stand and walk.  Therapist urged pt against doing this.   Patient and Family Stated Goals wound to heal    Date of Onset 07/11/21    Prior Treatments unna boots, hospitalizations    Pain Scale 0-10    Pain Score 4     Pain Location Leg    Pain Descriptors / Indicators Aching    Evaluation and Treatment Procedures  Explained to Patient/Family Yes    Evaluation and Treatment Procedures agreed to    Pressure Injury Properties Date First Assessed: 11/23/20 Time First Assessed: 1000 Location: Heel Location Orientation: Left Staging: Stage 1 -  Intact skin with non-blanchable redness of a localized area usually over a bony prominence. Wound Description (Comments): Lt heel Present on Admission: No   Dressing Type Silver dressings    Dressing Clean;Dry    Peri-wound Assessment Intact    Drainage Amount Moderate    Treatment Cleansed;Off loading    Wound Properties Date First Assessed: 11/16/20 Time First Assessed: 1050 Wound Type: Other (Comment) Location: Pretibial Location Orientation: Left Wound Description (Comments): multiple blisters on anterior, lateral, posterior and medial aspect of Lt LE Present on Admission: No   Wound Image View All Images View Images    Dressing Type Abdominal pads;Alginate;Gauze (Comment);Compression wrap    Dressing Changed Changed    Dressing Change Frequency PRN    Site / Wound Assessment Pink    % Wound base Red or Granulating 100%    Peri-wound Assessment Intact    Drainage Amount Minimal    Drainage Description Serous    Treatment Cleansed;Debridement (Selective)    Selective Debridement - Location Lt LE blistered borders    Selective Debridement - Tools Used Forceps    Selective Debridement - Tissue Removed devitalized skin    Wound Therapy - Clinical Statement Dressing on LT LE is not soaked this session and has minimal color to it.  Upon examination it appears that the only area that is draining his her heal.  Therapist changed dressing to xeroform on LE with 4x4 at heel prior to compression bandaging with profore.  Rt LE has no wounds and therapist returned to short stretch bandaging on this side.    Wound Therapy - Functional Problem List unable to fit in shoes, unable to dress due to drainage of Rt LE    Factors Delaying/Impairing Wound Healing Altered  sensation;Diabetes Mellitus;Infection - systemic/local;Polypharmacy    Wound Therapy - Frequency 3X / week    Wound Plan Assess heel in more detail as today pt was placed in a 45' slot.  Continue with appropriate woundcare.  Transition to compression stockings when able.    Dressing  Lt: xeroform, 4x4 at heal and profore compression bandaging    Dressing short stretch compresion bandaging with foam  [facility] Adult PT Treatment/Exercise - 11/27/20 0001      Manual Therapy   Manual Therapy Manual Lymphatic Drainage (MLD);Compression Bandaging;Other (comment)    Manual therapy comments completed seperate from all other aspects of treatment.    Manual Lymphatic Drainage (MLD) to include supraclavicular, deep and superfical abdominal, inguinal/axillary anastomosis and B LE.  Done anteriorly only due to time    Compression Bandaging RT short stretch and foam; Lt foam and profore    Other Manual Therapy measurement                    PT Short Term Goals - 11/27/20 1244      PT SHORT TERM GOAL #1   Title Patient will be independent in self management strategies in order to reduce the risk of wound reoccurance.    Time 3    Period Weeks    Status Achieved    Target Date 11/13/20             PT Long Term Goals - 11/27/20 1244      PT LONG TERM GOAL #1   Title Patient will be able to don/doff compression garments independently in order to manage LE edema.    Time 6    Period Weeks    Status On-going      PT LONG TERM GOAL #2   Title PT to understand and verbalize that she needs to wear her compression garment and pump on a daily basis at D/C or edema will return    Time 6    Period Weeks    Status Achieved      PT LONG TERM GOAL #3   Title Patient will be able to wear her shoes again for improved ambulation ability.    Time 6    Period Weeks    Status On-going      PT LONG TERM GOAL #4   Title PT to have lost 6-8 cm in LE; 4 cm in foot to reduce risk of  cellulitis    Time 5    Period Weeks    Status Achieved                 Plan - 11/27/20 1243    Clinical Impression Statement see above    Personal Factors and Comorbidities Fitness;Behavior Pattern;Past/Current Experience;Comorbidity 3+;Time since onset of injury/illness/exacerbation;Transportation    Comorbidities lymphedema, HTN, obesity    Examination-Activity Limitations Bathing;Locomotion Level;Transfers;Stairs;Stand;Dressing;Hygiene/Grooming    Examination-Participation Restrictions Meal Prep;Community Activity;Volunteer;Yard Work;Shop;Laundry;Cleaning    Stability/Clinical Decision Making Evolving/Moderate complexity    Rehab Potential Good    PT Frequency 3x / week    PT Duration 6 weeks    PT Treatment/Interventions ADLs/Self Care Home Management;Manual techniques;Manual lymph drainage;Compression bandaging;Gait training;Stair training;Functional mobility training;Therapeutic activities;Therapeutic exercise;Balance training;Neuromuscular re-education;Vasopneumatic Device;Taping;Splinting;Energy conservation;Passive range of motion;Scar mobilization;Patient/family education;DME Instruction    PT Next Visit Plan Continue with  lymphedema management, wound care    PT Home Exercise Plan ankle pumps, elevation; 4/7:  see above    Consulted and Agree with Plan of Care Patient           Patient will benefit from skilled therapeutic intervention in order to improve the following deficits and impairments:  Abnormal gait,Cardiopulmonary status limiting activity,Decreased endurance,Decreased activity tolerance,Decreased skin integrity,Pain,Increased edema,Decreased mobility  Visit Diagnosis: Lymphedema, not elsewhere classified  Abscess of right leg  Abscess of right lower extremity excluding foot     Problem List Patient Active Problem List   Diagnosis Date  Noted  . Abscess of right lower extremity excluding foot 10/18/2020  . Abscess of right leg 10/18/2020  .  Dehydration 10/03/2020  . Leukocytosis 10/03/2020  . Thrombocytosis 10/03/2020  . Hyponatremia 10/03/2020  . Hypochloremia 10/03/2020  . Failure to thrive in adult 10/03/2020  . Acute renal failure superimposed on stage 3b chronic kidney disease ([place]) 10/03/2020  . Uncontrolled type 2 diabetes mellitus with hyperglycemia, without long-term current use of insulin ([place]) 10/03/2020  . Acute kidney injury superimposed on CKD ([place]) 10/02/2020  . Chronic kidney disease 10/01/2020  . 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[place]) 05/12/2020  . Chronic insomnia 01/08/2020  . Ocular myasthenia gravis ([place]) 09/18/2019  . Peripheral edema 01/23/2019  . Asthma 05/14/2015  . Heel spur 02/24/2015  . Depression 02/24/2015  . Post-menopausal bleeding 06/02/2014  . OA (osteoarthritis) of knee 04/09/2014  . Epidermal cyst 10/01/2013  . Class 3 obesity 02/25/2013  . Back pain 03/04/2012  . Gout 01/25/2012  . Edema 01/28/2011  . Obesity hypoventilation syndrome ([place]) 06/16/2008  . OBSTRUCTIVE SLEEP APNEA 05/08/2008  . Hyperlipidemia 08/28/2006  . Anxiety 08/28/2006  . Essential hypertension 08/28/2006  . GERD 08/28/2006  . Osteoarthritis 08/28/2006   Rayetta Humphrey, PT CLT [phone] 11/27/2020, 12:45 PM  [facility] [address] [place], [place], [address] Phone: [phone]   Fax:  [phone]  Name: [name] MRN: [id number] Date of Birth: 03-31-1951

## 2020-11-30 ENCOUNTER — Other Ambulatory Visit: Payer: Self-pay

## 2020-11-30 ENCOUNTER — Ambulatory Visit (HOSPITAL_COMMUNITY): Payer: Medicare Other | Admitting: Physical Therapy

## 2020-11-30 ENCOUNTER — Encounter (HOSPITAL_COMMUNITY): Payer: Self-pay | Admitting: Physical Therapy

## 2020-11-30 DIAGNOSIS — R601 Generalized edema: Secondary | ICD-10-CM | POA: Diagnosis not present

## 2020-11-30 DIAGNOSIS — R262 Difficulty in walking, not elsewhere classified: Secondary | ICD-10-CM

## 2020-11-30 DIAGNOSIS — I890 Lymphedema, not elsewhere classified: Secondary | ICD-10-CM

## 2020-11-30 DIAGNOSIS — R600 Localized edema: Secondary | ICD-10-CM

## 2020-11-30 NOTE — Therapy (Signed)
[place], [place], [address] Phone: [phone]   Fax:  [phone]  Physical Therapy Treatment  Patient Details  Name: [name] MRN: [id number] Date of Birth: 04-06-1951 No data recorded  Encounter Date: 11/30/2020   PT End of Session - 11/30/20 1045    Visit Number 16    Number of Visits 20    Date for PT Re-Evaluation 12/04/20    Authorization Type [employer] Medicare (no visit limit, no auth)    Progress Note Due on Visit 18    PT Start Time 0915    PT Stop Time 1030    PT Time Calculation (min) 75 min    Activity Tolerance Patient tolerated treatment well    Behavior During Therapy [facility] for tasks assessed/performed           Past Medical History:  Diagnosis Date  . Achilles tendinitis   . Anxiety   . Asthma   . Depression   . GERD (gastroesophageal reflux disease)   . Gout   . H/O myasthenia gravis    left eye  . HTN (hypertension)   . Hyperlipidemia   . Low back pain   . Obesity hypoventilation syndrome ([place])   . Obstructive sleep apnea   . Osteoarthritis     Past Surgical History:  Procedure Laterality Date  . CATARACT EXTRACTION W/PHACO Left 09/17/2015   Procedure: CATARACT EXTRACTION PHACO AND INTRAOCULAR LENS PLACEMENT LEFT EYE cde=8.58;  Surgeon: Tonny Branch, MD;  Location: [facility];  Service: Ophthalmology;  Laterality: Left;  . CATARACT EXTRACTION W/PHACO Right 05/15/2017   Procedure: CATARACT EXTRACTION PHACO AND INTRAOCULAR LENS PLACEMENT (IOC);  Surgeon: Tonny Branch, MD;  Location: [facility] ORS;  Service: Ophthalmology;  Laterality: Right;  CDE: 6.34  . COLONOSCOPY N/A 12/25/2013   Procedure: COLONOSCOPY;  Surgeon: Rogene Houston, MD;  Location: [facility];  Service: Endoscopy;  Laterality: N/A;  730-rescheduled Ann notified pt  . COLONOSCOPY WITH PROPOFOL N/A 10/04/2019   Procedure: COLONOSCOPY WITH PROPOFOL;  Surgeon: Rogene Houston, MD;  Location: [facility];  Service: Endoscopy;   Laterality: N/A;  815  . CYST EXCISION N/A 09/12/2016   Procedure: EXCISION SEBACEOUS CYST, BACK;  Surgeon: Aviva Signs, MD;  Location: [facility];  Service: General;  Laterality: N/A;  . INTRAOCULAR LENS INSERTION Bilateral 2018   Dr. Tonny Branch   . POLYPECTOMY  10/04/2019   Procedure: POLYPECTOMY;  Surgeon: Rogene Houston, MD;  Location: [facility];  Service: Endoscopy;;  . TUBAL LIGATION      There were no vitals filed for this visit.   Subjective Assessment - 11/30/20 1036    Subjective Pt states overall she is feeling better    Pertinent History chronic edema in LE with wounds causing hospitalization, ( most likely lymphedema), gout, LBP, DM,                           Wound Therapy - 11/30/20 0001    Subjective PT states that overall she is feeling better    Patient and Family Stated Goals wound to heal    Date of Onset 07/11/21    Prior Treatments unna boots, hospitalizations    Pain Scale 0-10    Pain Score 3     Pain Location Leg    Pain Orientation Right;Left    Pain Descriptors / Indicators Sore    Evaluation and Treatment Procedures Explained to  Patient/Family Yes    Evaluation and Treatment Procedures agreed to    Wound Properties Date First Assessed: 11/16/20 Time First Assessed: 1050 Wound Type: Other (Comment) Location: Pretibial Location Orientation: Left Wound Description (Comments): multiple blisters on anterior, lateral, posterior and medial aspect of Lt LE Present on Admission: No   Dressing Type Impregnated gauze (bismuth);Alginate    Dressing Changed Changed    Dressing Change Frequency PRN    Site / Wound Assessment Pink    % Wound base Red or Granulating 100%    Peri-wound Assessment Edema    Drainage Amount Scant    Drainage Description Serous    Treatment Cleansed;Other (Comment)    Selective Debridement - Tissue Removed devitalized skin    Wound Therapy - Clinical Statement Pt dressings removed.  There is no edema noted in Rt LE and  pt is waiting on her compression garment.  Lt LE anterior wounds have all healed.  posterior wounds are almost healed with slight drainage.  Therapist opted to return to short stretch compression dressing on the leg.  New foam was cut to ensure no drainage had gotten on the foam.   Pt has B induration in thighs, however, therapist opted not to compression bandage due to increased edema in B UE>  Instead pt was educated on self manual for the thigh area.    Wound Therapy - Functional Problem List unable to fit in shoes, unable to dress due to drainage of Rt LE    Factors Delaying/Impairing Wound Healing Altered sensation;Diabetes Mellitus;Infection - systemic/local;Polypharmacy    Wound Therapy - Frequency 3X / week    Wound Plan Change both LE dressing to short stretch multi layer compression bandaging with 1/2" foam    Dressing  posterior aspect only xeroform, alginate and kerlix followed by foam and short stretch bandagig    Dressing short stretch compresion bandaging with foam            [facility] Adult PT Treatment/Exercise - 11/30/20 0001      Manual Therapy   Manual Therapy Manual Lymphatic Drainage (MLD);Compression Bandaging;Other (comment)    Manual therapy comments completed seperate from all other aspects of treatment.    Manual Lymphatic Drainage (MLD) to include supraclavicular, deep and superfical abdominal, inguinal/axillary anastomosis and B LE.  Posterior completed sidelying for comfort.    Compression Bandaging foam and multilayer short stretch bandaging                    PT Short Term Goals - 11/27/20 1244      PT SHORT TERM GOAL #1   Title Patient will be independent in self management strategies in order to reduce the risk of wound reoccurance.    Time 3    Period Weeks    Status Achieved    Target Date 11/13/20             PT Long Term Goals - 11/27/20 1244      PT LONG TERM GOAL #1   Title Patient will be able to don/doff compression garments  independently in order to manage LE edema.    Time 6    Period Weeks    Status On-going      PT LONG TERM GOAL #2   Title PT to understand and verbalize that she needs to wear her compression garment and pump on a daily basis at D/C or edema will return    Time 6    Period Weeks  Status Achieved      PT LONG TERM GOAL #3   Title Patient will be able to wear her shoes again for improved ambulation ability.    Time 6    Period Weeks    Status On-going      PT LONG TERM GOAL #4   Title PT to have lost 6-8 cm in LE; 4 cm in foot to reduce risk of cellulitis    Time 5    Period Weeks    Status Achieved                 Plan - 11/30/20 1045    Clinical Impression Statement see above           Patient will benefit from skilled therapeutic intervention in order to improve the following deficits and impairments:     Visit Diagnosis: Lymphedema, not elsewhere classified  Bilateral lower extremity edema  Generalized edema  Difficulty in walking, not elsewhere classified     Problem List Patient Active Problem List   Diagnosis Date Noted  . Abscess of right lower extremity excluding foot 10/18/2020  . Abscess of right leg 10/18/2020  . Dehydration 10/03/2020  . Leukocytosis 10/03/2020  . Thrombocytosis 10/03/2020  . Hyponatremia 10/03/2020  . Hypochloremia 10/03/2020  . Failure to thrive in adult 10/03/2020  . Acute renal failure superimposed on stage 3b chronic kidney disease ([place]) 10/03/2020  . Uncontrolled type 2 diabetes mellitus with hyperglycemia, without long-term current use of insulin ([place]) 10/03/2020  . Acute kidney injury superimposed on CKD ([place]) 10/02/2020  . Chronic kidney disease 10/01/2020  . 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[place]) 05/12/2020  . Chronic insomnia 01/08/2020  . Ocular myasthenia gravis ([place]) 09/18/2019  . Peripheral edema 01/23/2019  . Asthma 05/14/2015  . Heel spur 02/24/2015  . Depression 02/24/2015  . Post-menopausal  bleeding 06/02/2014  . OA (osteoarthritis) of knee 04/09/2014  . Epidermal cyst 10/01/2013  . Class 3 obesity 02/25/2013  . Back pain 03/04/2012  . Gout 01/25/2012  . Edema 01/28/2011  . Obesity hypoventilation syndrome ([place]) 06/16/2008  . OBSTRUCTIVE SLEEP APNEA 05/08/2008  . Hyperlipidemia 08/28/2006  . Anxiety 08/28/2006  . Essential hypertension 08/28/2006  . GERD 08/28/2006  . Osteoarthritis 08/28/2006    Rayetta Humphrey, PT CLT [phone] 11/30/2020, 10:46 AM  [place] [address] [place], [place], [address] Phone: [phone]   Fax:  [phone]  Name: BIANCO CANGE MRN: [id number] Date of Birth: 1950-11-12

## 2020-12-01 ENCOUNTER — Encounter: Payer: Self-pay | Admitting: Diagnostic Neuroimaging

## 2020-12-01 ENCOUNTER — Ambulatory Visit (INDEPENDENT_AMBULATORY_CARE_PROVIDER_SITE_OTHER): Payer: Medicare Other | Admitting: Diagnostic Neuroimaging

## 2020-12-01 VITALS — BP 137/60 | HR 87 | Ht 64.5 in | Wt 254.0 lb

## 2020-12-01 DIAGNOSIS — G7000 Myasthenia gravis without (acute) exacerbation: Secondary | ICD-10-CM

## 2020-12-01 MED ORDER — PREDNISONE 10 MG PO TABS: 20.0000 mg | ORAL_TABLET | Freq: Every day | ORAL | 4 refills | Status: DC

## 2020-12-01 MED ORDER — PYRIDOSTIGMINE BROMIDE 60 MG PO TABS: 60.0000 mg | ORAL_TABLET | Freq: Three times a day (TID) | ORAL | 4 refills | Status: DC

## 2020-12-01 NOTE — Progress Notes (Signed)
[facility]  PATIENT: [name] DOB: 14-May-1951  REFERRING CLINICIAN: Alycia Rossetti, MD HISTORY FROM: patient  REASON FOR VISIT: follow up   HISTORICAL  CHIEF COMPLAINT:  Chief Complaint  Patient presents with  . Ocular myasthenia    Rm 6, FU    HISTORY OF PRESENT ILLNESS:   UPDATE (12/01/20, VRP): Since last visit, ocular MG is better. On prednisone and pyridostigmine. Having more issues with wounds and leg edema.   UPDATE (04/01/20, VRP): Since last visit, continues with ptosis and double vision. Symptoms are progressive. Severity is moderate. No alleviating or aggravating factors. Tolerating meds.  UPDATE (01/21/20, VRP): Since last visit, doing well. Symptoms are stable. Severity is mild. No alleviating or aggravating factors. Tolerating pyridostigmine.    UPDATE (09/17/19, VRP): Since last visit, doing worse with left eye ptosis and double vision. Symptoms are progressive. No breathing issues. Some gen weakness. Tolerating pyridostigmine 60mg  three times a day.    UPDATE (02/06/19, VRP): Since last visit, doing about the same. Symptoms are stable. Severity is mild. No alleviating or aggravating factors. Tolerating pyridostigmine. Mild intermittent left ptosis. No SOB, chewing or swallowing issues.   UPDATE (01/02/18, VRP): Since last visit, doing well. Tolerating meds. No alleviating or aggravating factors. No SOB, swallow diff, speech diff. Vision stable.   UPDATE (07/05/17, VRP): Since last visit, doing well. Tolerating pyridostigmine (60mg  three times a day). No alleviating or aggravating factors. Eyes are better. Has some flare of sxs every month (2x per month; few hrs each time). No breathing, swallowing or speaking problems. CT chest reviewed with patient.   PRIOR HPI (03/22/17): 70 year old female with hypertension, hypercholesterolemia, anxiety, here for evaluation of double vision. 6 weeks ago patient had sudden onset of redness in both eyes.  She then developed double vision and left eyelid drooping. She went to eye doctor, then referred to emergency room for MRI testing to rule out stroke. MRI of the brain and orbits were negative for acute process. Patient then had myasthenia gravis antibody panel testing which was notable for borderline positive anti-striation autoantibodies. Patient for here for further evaluation and management. Patient also reports a number of other symptoms including fatigue, swelling in legs, wheezing, headache, numbness, weakness, slurred speech. However these symptoms do not correlate with the recent 6 weeks of double vision and left eyelid weakness. Patient has been using an eye patch due to her double vision. Symptoms have been fairly consistent since onset. However later in her conversation patient states that symptoms fluctuate and are worse in the evening.   REVIEW OF SYSTEMS: Full 14 system review of systems performed and negative with exception of: as per HPI.    ALLERGIES: No Known Allergies  HOME MEDICATIONS: Outpatient Medications Prior to Visit  Medication Sig Dispense Refill  . acetaminophen (TYLENOL) 500 MG tablet Take 1,000 mg by mouth every 6 (six) hours as needed for mild pain.     Marland Kitchen albuterol (VENTOLIN HFA) 108 (90 Base) MCG/ACT inhaler INHALE 2 PUFFS INTO THE LUNGS EVERY 4 HOURS AS NEEDED FOR WHEEZING OR SHORTNESS OF BREATH (Patient taking differently: Inhale 2 puffs into the lungs every 4 (four) hours as needed for wheezing or shortness of breath.) 8.5 g 2  . allopurinol (ZYLOPRIM) 100 MG tablet TAKE 1 TABLET(100 MG) BY MOUTH DAILY (Patient taking differently: Take 100 mg by mouth daily.) 90 tablet 3  . aspirin EC 81 MG tablet Take 1 tablet (81 mg total) by mouth every evening.    Marland Kitchen  atorvastatin (LIPITOR) 40 MG tablet TAKE 1 TABLET(40 MG) BY MOUTH DAILY AT 6 PM (Patient taking differently: Take 40 mg by mouth daily.) 90 tablet 1  . Blood Glucose Monitoring Suppl (BLOOD GLUCOSE SYSTEM PAK) KIT  Use as directed to monitor FSBS 3x weekly. Dx: R73.09. 1 kit 1  . busPIRone (BUSPAR) 5 MG tablet TAKE 1 TABLET(5 MG) BY MOUTH TWICE DAILY FOR ANXIETY (Patient taking differently: Take 5 mg by mouth 2 (two) times daily.) 60 tablet 0  . clotrimazole (LOTRIMIN) 1 % cream Apply 1 application topically 2 (two) times daily. 30 g 1  . diphenhydrAMINE (BENADRYL) 25 MG tablet Take 25 mg by mouth daily.    . fluticasone (CUTIVATE) 0.05 % cream APPLY EXTERNALLY TO THE AFFECTED AREA DAILY 30 g 3  . furosemide (LASIX) 40 MG tablet TAKE 1 TABLET(40 MG) BY MOUTH DAILY (Patient taking differently: Take 40 mg by mouth daily.) 90 tablet 3  . Glucose Blood (BLOOD GLUCOSE TEST STRIPS) STRP Use as directed to monitor FSBS 3x weekly. Dx: R73.09. 50 strip 11  . HYDROcodone-acetaminophen (NORCO) 7.5-325 MG tablet Take 1 tablet by mouth 2 (two) times daily as needed for moderate pain. 60 tablet 0  . Lancets MISC Use as directed to monitor FSBS 3x weekly. Dx: R73.09. 50 each 11  . Magnesium 250 MG TABS Take 1 tablet (250 mg total) by mouth daily. (Patient taking differently: Take 250 mg by mouth daily in the afternoon. Midday) 30 tablet 11  . metFORMIN (GLUCOPHAGE) 500 MG tablet Take 1 tablet (500 mg total) by mouth 2 (two) times daily with a meal. 60 tablet 3  . nystatin (MYCOSTATIN/NYSTOP) powder Apply 1 application topically 3 (three) times daily. 60 g 1  . omeprazole (PRILOSEC) 40 MG capsule TAKE 1 CAPSULE(40 MG) BY MOUTH DAILY (Patient taking differently: Take 40 mg by mouth daily.) 90 capsule 3  . OXYGEN Inhale 1 L into the lungs at bedtime. IN ADDITION TO CPAP NIGHTLY    . polyethylene glycol powder (MIRALAX) 17 GM/SCOOP powder Mix 1 scoop (17 grams) of powder in water daily to help with constipation. 255 g 0  . potassium chloride (KLOR-CON) 10 MEQ tablet TAKE 1 TABLET BY MOUTH DAILY WITH FLUID PILL (Patient taking differently: Take 10 mEq by mouth daily. With fluid pill) 90 tablet 3  . predniSONE (DELTASONE) 10 MG  tablet Pt currently on $RemoveBefo'60mg'dJVeYleBVAN$  daily, start taking $RemoveBefo'50mg'wlceyifGiHf$  daily for one month, then decrease to $RemoveBef'40mg'SgkSbowDWs$  daily for 1 month, then decrease $RemoveBefor'30mg'VjPtqICiYfoo$  daily. 180 tablet 12  . pyridostigmine (MESTINON) 180 MG CR tablet Take 180 mg by mouth daily.    . traZODone (DESYREL) 100 MG tablet TAKE 1 TABLET BY MOUTH AT BEDTIME AS NEEDED FOR SLEEP (Patient taking differently: Take 100 mg by mouth at bedtime as needed for sleep.) 90 tablet 2   No facility-administered medications prior to visit.    PAST MEDICAL HISTORY: Past Medical History:  Diagnosis Date  . Achilles tendinitis   . Anxiety   . Asthma   . Depression   . GERD (gastroesophageal reflux disease)   . Gout   . H/O myasthenia gravis    left eye  . HTN (hypertension)   . Hyperlipidemia   . Low back pain   . Obesity hypoventilation syndrome ([place])   . Obstructive sleep apnea   . Osteoarthritis     PAST SURGICAL HISTORY: Past Surgical History:  Procedure Laterality Date  . CATARACT EXTRACTION W/PHACO Left 09/17/2015   Procedure: CATARACT EXTRACTION PHACO AND  INTRAOCULAR LENS PLACEMENT LEFT EYE cde=8.58;  Surgeon: Tonny Branch, MD;  Location: [facility];  Service: Ophthalmology;  Laterality: Left;  . CATARACT EXTRACTION W/PHACO Right 05/15/2017   Procedure: CATARACT EXTRACTION PHACO AND INTRAOCULAR LENS PLACEMENT (IOC);  Surgeon: Tonny Branch, MD;  Location: [facility];  Service: Ophthalmology;  Laterality: Right;  CDE: 6.34  . COLONOSCOPY N/A 12/25/2013   Procedure: COLONOSCOPY;  Surgeon: Rogene Houston, MD;  [facility];  Service: Endoscopy;  Laterality: N/A;  730-rescheduled Ann notified pt  . COLONOSCOPY WITH PROPOFOL N/A 10/04/2019   Procedure: COLONOSCOPY WITH PROPOFOL;  Surgeon: Rogene Houston, MD;  [facility];  Service: Endoscopy;  Laterality: N/A;  815  . CYST EXCISION N/A 09/12/2016   Procedure: EXCISION SEBACEOUS CYST, BACK;  Surgeon: Aviva Signs, MD;  Location: [facility];  Service: General;  Laterality: N/A;  . INTRAOCULAR LENS  INSERTION Bilateral 2018   Dr. Tonny Branch   . POLYPECTOMY  10/04/2019   Procedure: POLYPECTOMY;  Surgeon: Rogene Houston, MD;  Location[facility];  Service: Endoscopy;;  . TUBAL LIGATION      FAMILY HISTORY: Family History  Problem Relation Age of Onset  . Heart disease Mother   . Thyroid disease Mother   . Heart failure Father   . Lung disease Father   . Diabetes Brother   . Crohn's disease Daughter     SOCIAL HISTORY:  Social History   Socioeconomic History  . Marital status: Widowed    Spouse name: Not on file  . Number of children: 3  . Years of education: 66  . Highest education level: Not on file  Occupational History  . Occupation: disabled    Fish farm manager: UNEMPLOYED  Tobacco Use  . Smoking status: Former Smoker    Packs/day: 2.00    Years: 36.00    Pack years: 72.00    Types: Cigarettes    Start date: 06/25/1969    Quit date: 03/25/2005    Years since quitting: 15.6  . Smokeless tobacco: Never Used  Vaping Use  . Vaping Use: Never used  Substance and Sexual Activity  . Alcohol use: No    Alcohol/week: 0.0 standard drinks  . Drug use: No  . Sexual activity: Not Currently    Birth control/protection: Post-menopausal  Other Topics Concern  . Not on file  Social History Narrative   Originally from [place]. She has always lived in [place]. 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 No pets currently. No bird, mold, or hot tub exposure.    Lives alone   No caffeine   Social Determinants of Health   Financial Resource Strain: Not on file  Food Insecurity: Not on file  Transportation Needs: Not on file  Physical Activity: Not on file  Stress: Not on file  Social Connections: Not on file  Intimate Partner Violence: Not on file     PHYSICAL EXAM  GENERAL EXAM/CONSTITUTIONAL: Vitals:  Vitals:   12/01/20 1136  Weight: 254 lb (115.2 kg)  Height: 5' 4.5" (1.638 m)   Body mass index is 42.93 kg/m. No exam data  present  Patient is in no distress; well developed, nourished and groomed; neck is supple  CARDIOVASCULAR:  Examination of carotid arteries is normal; no carotid bruits  Regular rate and rhythm, no murmurs  Examination of peripheral vascular system by observation and palpation is normal  EYES:  Ophthalmoscopic exam of optic discs and posterior segments is normal; no papilledema  or hemorrhages  MUSCULOSKELETAL:  Gait, strength, tone, movements noted in Neurologic exam below  NEUROLOGIC: MENTAL STATUS:  No flowsheet data found.  awake, alert, oriented to person, place and time  recent and remote memory intact  normal attention and concentration  language fluent, comprehension intact, naming intact,   fund of knowledge appropriate  CRANIAL NERVE:   2nd - no papilledema on fundoscopic exam  2nd, 3rd, 4th, 6th - pupils equal and reactive to light, visual fields full to confrontation, extraocular muscles intact, MILD LEFT PTOSIS; NO DOUBLE VISION; END GAZE NYSTAGMUS  5th - facial sensation symmetric  7th - facial strength symmetric  8th - hearing intact  9th - palate elevates symmetrically, uvula midline  11th - shoulder shrug symmetric  12th - tongue protrusion midline  MOTOR:   normal bulk and tone, DIFFUSE 4/5 in the BUE, BLE  SENSORY:   normal and symmetric to light touch  COORDINATION:   finger-nose-finger, fine finger movements normal  REFLEXES:   deep tendon reflexes TRACE and symmetric  GAIT/STATION:   narrow based gait    DIAGNOSTIC DATA (LABS, IMAGING, TESTING) - I reviewed patient records, labs, notes, testing and imaging myself where available.  Lab Results  Component Value Date   WBC 7.6 11/21/2020   HGB 10.1 (L) 11/21/2020   HCT 31.6 (L) 11/21/2020   MCV 93.8 11/21/2020   PLT 354 11/21/2020      Component Value Date/Time   NA 137 11/21/2020 1421   K 4.1 11/21/2020 1421   CL 107 11/21/2020 1421   CO2 23 11/21/2020 1421    GLUCOSE 106 (H) 11/21/2020 1421   BUN 11 11/21/2020 1421   CREATININE 1.33 (H) 11/21/2020 1421   CREATININE 4.23 (H) 10/01/2020 1204   CALCIUM 8.4 (L) 11/21/2020 1421   PROT 6.2 (L) 11/21/2020 1421   ALBUMIN 2.9 (L) 11/21/2020 1421   AST 18 11/21/2020 1421   ALT 11 11/21/2020 1421   ALKPHOS 100 11/21/2020 1421   BILITOT 0.6 11/21/2020 1421   GFRNONAA 43 (L) 11/21/2020 1421   GFRNONAA 10 (L) 10/01/2020 1204   GFRAA 11 (L) 10/01/2020 1204   Lab Results  Component Value Date   CHOL 140 01/08/2020   HDL 56 01/08/2020   LDLCALC 61 01/08/2020   LDLDIRECT 157 (H) 02/25/2013   TRIG 144 01/08/2020   CHOLHDL 2.5 01/08/2020   Lab Results  Component Value Date   HGBA1C 8.7 (H) 09/16/2020   Lab Results  Component Value Date   VITAMINB12 450 01/25/2012   Lab Results  Component Value Date   TSH 1.766 01/25/2012    02/24/17 MRI brain / orbits [I reviewed images myself and agree with interpretation. -VRP]  - No explanation for symptoms. No infarct, compressive lesion, or neuritis findings.  03/01/17 labs - anti AchR binding, blocking, modulation - normal - anti striational ab - 1:40 (h)  01/13/20      AChR Binding Ab, Serum 0.00 - 0.24 nmol/L 0.05    01/21/20      MuSK Antibodies U/mL <1.0    04/05/17 CT chest - No evidence of residual thymic tissue or or thymoma. - No acute abnormality noted.    ASSESSMENT AND PLAN  70 y.o. year old female here with new onset of double vision left eyelid ptosis, with positive ice pack test in office, and slightly positive anti-striation all antibody testing. Findings consistent with ocular myasthenia gravis.   Dx:  1. Ocular myasthenia (HCC)     PLAN:  MYASTHENIA GRAVIS (  ocular)  - continue pyridostigmine to 37m three times a day   - reduce prednisone to 159mdaily; caution with BP and weight control; continue prilosec; take vitamin D + calcium supplement; may need to gradually reduce over time  - cautioned patient if she  develops breathing, chewing, swallowing sxs; patient to [place] or go to ER if bulbar or generalized symptoms develop  Meds ordered this encounter  Medications  . predniSONE (DELTASONE) 10 MG tablet    Sig: Take 2 tablets (20 mg total) by mouth daily.    Dispense:  180 tablet    Refill:  4  . pyridostigmine (MESTINON) 60 MG tablet    Sig: Take 1 tablet (60 mg total) by mouth 3 (three) times daily.    Dispense:  270 tablet    Refill:  4   Return in about 1 year (around 12/01/2021).    VIPenni BombardMD 11/30/36/[id number]:02M Certified in Neurology, Neurophysiology and Neuroimaging  [facility] [address] AvenueSuTalogarNelagoneyNC [id number]

## 2020-12-01 NOTE — Patient Instructions (Addendum)
MYASTHENIA GRAVIS (ocular)  - continue pyridostigmine to 60mg  three times a day   - reduce prednisone to 15mg  daily; caution with BP and weight control; continue prilosec; take vitamin D + calcium supplement  - cautioned patient if she develops breathing, chewing, swallowing sxs; patient to contact us right away or go to ER if bulbar or generalized symptoms develop

## 2020-12-02 ENCOUNTER — Ambulatory Visit (HOSPITAL_COMMUNITY): Payer: Medicare Other | Admitting: Physical Therapy

## 2020-12-02 ENCOUNTER — Other Ambulatory Visit: Payer: Self-pay

## 2020-12-02 DIAGNOSIS — R600 Localized edema: Secondary | ICD-10-CM

## 2020-12-02 DIAGNOSIS — I890 Lymphedema, not elsewhere classified: Secondary | ICD-10-CM

## 2020-12-02 DIAGNOSIS — R601 Generalized edema: Secondary | ICD-10-CM

## 2020-12-02 NOTE — Therapy (Signed)
[place], [place], [address] Phone: [phone]   Fax:  [phone]  Physical Therapy Treatment  Patient Details  Name: [name] MRN: [id number] Date of Birth: [birth year] No data recorded  Encounter Date: 12/02/2020   PT End of Session - 12/02/20 1600    Visit Number 17    Number of Visits 20    Date for PT Re-Evaluation 12/04/20    Authorization Type UHC Medicare (no visit limit, no auth)    Progress Note Due on Visit 18    PT Start Time 1045    PT Stop Time 1208    PT Time Calculation (min) 83 min    Activity Tolerance Patient tolerated treatment well    Behavior During Therapy [facility] for tasks assessed/performed           Past Medical History:  Diagnosis Date  . Achilles tendinitis   . Anxiety   . Asthma   . Depression   . GERD (gastroesophageal reflux disease)   . Gout   . H/O myasthenia gravis    left eye  . HTN (hypertension)   . Hyperlipidemia   . Low back pain   . Obesity hypoventilation syndrome ([place])   . Obstructive sleep apnea   . Osteoarthritis     Past Surgical History:  Procedure Laterality Date  . CATARACT EXTRACTION W/PHACO Left 09/17/2015   Procedure: CATARACT EXTRACTION PHACO AND INTRAOCULAR LENS PLACEMENT LEFT EYE cde=8.58;  Surgeon: Tonny Branch, MD;  Location: [facility];  Service: Ophthalmology;  Laterality: Left;  . CATARACT EXTRACTION W/PHACO Right 05/15/2017   Procedure: CATARACT EXTRACTION PHACO AND INTRAOCULAR LENS PLACEMENT (IOC);  Surgeon: Tonny Branch, MD;  Location: [facility];  Service: Ophthalmology;  Laterality: Right;  CDE: 6.34  . COLONOSCOPY N/A 12/25/2013   Procedure: COLONOSCOPY;  Surgeon: Rogene Houston, MD;  Location: [facility];  Service: Endoscopy;  Laterality: N/A;  730-rescheduled Ann notified pt  . COLONOSCOPY WITH PROPOFOL N/A 10/04/2019   Procedure: COLONOSCOPY WITH PROPOFOL;  Surgeon: Rogene Houston, MD;  Location: [facility];  Service: Endoscopy;   Laterality: N/A;  815  . CYST EXCISION N/A 09/12/2016   Procedure: EXCISION SEBACEOUS CYST, BACK;  Surgeon: Aviva Signs, MD;  Location: [facility];  Service: General;  Laterality: N/A;  . INTRAOCULAR LENS INSERTION Bilateral 2018   Dr. Tonny Branch   . POLYPECTOMY  10/04/2019   Procedure: POLYPECTOMY;  Surgeon: Rogene Houston, MD;  Location: [facility];  Service: Endoscopy;;  . TUBAL LIGATION      There were no vitals filed for this visit.   Subjective Assessment - 12/02/20 1558    Subjective Pt states her Lt foot hurts.  STates she removed the toe bandages off the Rt but couldn't get to the Lt one.    Currently in Pain? Yes    Pain Score 5     Pain Location Foot    Pain Orientation Left    Pain Descriptors / Indicators Aching;Burning                           Wound Therapy - 12/02/20 1613    Subjective Reports her Lt heel hurts.    Patient and Family Stated Goals wound to heal    Date of Onset 07/11/21    Prior Treatments unna boots, hospitalizations    Evaluation and Treatment Procedures Explained to Patient/Family Yes    Evaluation  and Treatment Procedures agreed to    Wound Properties Date First Assessed: 11/16/20 Time First Assessed: 1050 Wound Type: Other (Comment) Location: Pretibial Location Orientation: Left Wound Description (Comments): multiple blisters on anterior, lateral, posterior and medial aspect of Lt LE Present on Admission: No   Dressing Type Impregnated gauze (bismuth)    Dressing Changed Changed    Dressing Change Frequency PRN    Site / Wound Assessment Red;Pink    % Wound base Red or Granulating 100%    Peri-wound Assessment Edema    Drainage Amount Scant   mostly but moderately latearl wound   Drainage Description Serous    Treatment Cleansed;Debridement (Selective)    Selective Debridement - Tissue Removed devitalized skin    Wound Therapy - Clinical Statement see below assessment    Wound Therapy - Functional Problem List unable to  fit in shoes, unable to dress due to drainage of Rt LE    Factors Delaying/Impairing Wound Healing Altered sensation;Diabetes Mellitus;Infection - systemic/local;Polypharmacy    Wound Therapy - Frequency 3X / week    Wound Plan see below plan    Dressing  posterior aspect only xeroform, alginate and kerlix followed by foam and short stretch bandagig    Dressing short stretch compresion bandaging with foam            [facility] Adult PT Treatment/Exercise - 12/02/20 0001      Manual Therapy   Manual Therapy Manual Lymphatic Drainage (MLD);Compression Bandaging;Other (comment)    Manual therapy comments completed seperate from all other aspects of treatment.    Manual Lymphatic Drainage (MLD) to include supraclavicular, deep and superfical abdominal, inguinal/axillary anastomosis and B LE.  Posterior completed sidelying for comfort.    Compression Bandaging foam and multilayer short stretch bandaging                    PT Short Term Goals - 12/02/20 1611      PT SHORT TERM GOAL #1   Time 3    Period Weeks    Status Achieved    Target Date 11/13/20             PT Long Term Goals - 11/27/20 1244      PT LONG TERM GOAL #1   Title Patient will be able to don/doff compression garments independently in order to manage LE edema.    Time 6    Period Weeks    Status On-going      PT LONG TERM GOAL #2   Title PT to understand and verbalize that she needs to wear her compression garment and pump on a daily basis at D/C or edema will return    Time 6    Period Weeks    Status Achieved      PT LONG TERM GOAL #3   Title Patient will be able to wear her shoes again for improved ambulation ability.    Time 6    Period Weeks    Status On-going      PT LONG TERM GOAL #4   Title PT to have lost 6-8 cm in LE; 4 cm in foot to reduce risk of cellulitis    Time 5    Period Weeks    Status Achieved                 Plan - 12/02/20 1612    Clinical Impression Statement  Pt with c/o pain in Lt foot from heel.  Pressure sore does  look improved since last couple of session and wound on Lt is now mostly healed with only lateral aspect still open but at 100% granulation.  This area also continues to moderately drain.  Cleansed well but did not need any debridement.  Moisturized and continued with xeroform and gauze prior to short stretch bandaging.  Omitted toe bandaging this session as pt reported discomfort with these.  Also turned foam around with cutouts facing out as skin was bundled up in these spaces and avoiding possible blisters.  Pt reported overall comfort with bandaging at EOS.    Personal Factors and Comorbidities Fitness;Behavior Pattern;Past/Current Experience;Comorbidity 3+;Time since onset of injury/illness/exacerbation;Transportation    Comorbidities lymphedema, HTN, obesity    Examination-Activity Limitations Bathing;Locomotion Level;Transfers;Stairs;Stand;Dressing;Hygiene/Grooming    Examination-Participation Restrictions Meal Prep;Community Activity;Volunteer;Yard Work;Shop;Laundry;Cleaning    Stability/Clinical Decision Making Evolving/Moderate complexity    Rehab Potential Good    PT Frequency 3x / week    PT Duration 6 weeks    PT Treatment/Interventions ADLs/Self Care Home Management;Manual techniques;Manual lymph drainage;Compression bandaging;Gait training;Stair training;Functional mobility training;Therapeutic activities;Therapeutic exercise;Balance training;Neuromuscular re-education;Vasopneumatic Device;Taping;Splinting;Energy conservation;Passive range of motion;Scar mobilization;Patient/family education;DME Instruction    PT Next Visit Plan Continue with  lymphedema management, wound care as needed.  Measure weekly.  complete recert next visit.    PT Home Exercise Plan ankle pumps, elevation; 4/7:  see above    Consulted and Agree with Plan of Care Patient           Patient will benefit from skilled therapeutic intervention in order to  improve the following deficits and impairments:  Abnormal gait,Cardiopulmonary status limiting activity,Decreased endurance,Decreased activity tolerance,Decreased skin integrity,Pain,Increased edema,Decreased mobility  Visit Diagnosis: Bilateral lower extremity edema  Lymphedema, not elsewhere classified  Generalized edema     Problem List Patient Active Problem List   Diagnosis Date Noted  . Abscess of right lower extremity excluding foot 10/18/2020  . Abscess of right leg 10/18/2020  . Dehydration 10/03/2020  . Leukocytosis 10/03/2020  . Thrombocytosis 10/03/2020  . Hyponatremia 10/03/2020  . Hypochloremia 10/03/2020  . Failure to thrive in adult 10/03/2020  . Acute renal failure superimposed on stage 3b chronic kidney disease ([place]) 10/03/2020  . Uncontrolled type 2 diabetes mellitus with hyperglycemia, without long-term current use of insulin ([place]) 10/03/2020  . Acute kidney injury superimposed on CKD ([place]) 10/02/2020  . Chronic kidney disease 10/01/2020  . 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[place]) 05/12/2020  . Chronic insomnia 01/08/2020  . Ocular myasthenia gravis ([place]) 09/18/2019  . Peripheral edema 01/23/2019  . Asthma 05/14/2015  . Heel spur 02/24/2015  . Depression 02/24/2015  . Post-menopausal bleeding 06/02/2014  . OA (osteoarthritis) of knee 04/09/2014  . Epidermal cyst 10/01/2013  . Class 3 obesity 02/25/2013  . Back pain 03/04/2012  . Gout 01/25/2012  . Edema 01/28/2011  . Obesity hypoventilation syndrome ([place]) 06/16/2008  . OBSTRUCTIVE SLEEP APNEA 05/08/2008  . Hyperlipidemia 08/28/2006  . Anxiety 08/28/2006  . Essential hypertension 08/28/2006  . GERD 08/28/2006  . Osteoarthritis 08/28/2006   Teena Irani, PTA/CLT [phone]  Teena Irani 12/02/2020, 4:22 PM  [facility] [address] [place], [place], [address] Phone: [phone]   Fax:  [phone]  Name: [name] MRN: [id number] Date  of Birth: 07/29/1950

## 2020-12-03 ENCOUNTER — Encounter (HOSPITAL_COMMUNITY): Payer: Self-pay

## 2020-12-03 ENCOUNTER — Ambulatory Visit (HOSPITAL_COMMUNITY): Payer: Medicare Other

## 2020-12-03 ENCOUNTER — Encounter (HOSPITAL_COMMUNITY): Payer: Medicare Other

## 2020-12-03 DIAGNOSIS — R601 Generalized edema: Secondary | ICD-10-CM

## 2020-12-03 DIAGNOSIS — I890 Lymphedema, not elsewhere classified: Secondary | ICD-10-CM

## 2020-12-03 DIAGNOSIS — R262 Difficulty in walking, not elsewhere classified: Secondary | ICD-10-CM

## 2020-12-03 DIAGNOSIS — R600 Localized edema: Secondary | ICD-10-CM

## 2020-12-03 NOTE — Therapy (Addendum)
[facility] [address] [place], [place], [address] Phone: [phone]   Fax:  [phone]  Physical Therapy Treatment  Patient Details  Name: [name] MRN: [id number] Date of Birth: [birth year] No data recorded  Encounter Date: 12/03/2020 Progress Note Reporting Period 11/12/2020 to 12/03/2020  See note below for Objective Data and Assessment of Progress/Goals.       PT End of Session - 12/03/20 1610    Visit Number 18    Number of Visits 27   Date for PT Re-Evaluation 12/04/20    Authorization Type UHC Medicare (no visit limit, no auth)    Progress Note Due on Visit 27   PT Start Time 1449    PT Stop Time 1605    PT Time Calculation (min) 76 min    Activity Tolerance Patient tolerated treatment well    Behavior During Therapy WFL for tasks assessed/performed           Past Medical History:  Diagnosis Date  . Achilles tendinitis   . Anxiety   . Asthma   . Depression   . GERD (gastroesophageal reflux disease)   . Gout   . H/O myasthenia gravis    left eye  . HTN (hypertension)   . Hyperlipidemia   . Low back pain   . Obesity hypoventilation syndrome ([place])   . Obstructive sleep apnea   . Osteoarthritis     Past Surgical History:  Procedure Laterality Date  . CATARACT EXTRACTION W/PHACO Left 09/17/2015   Procedure: CATARACT EXTRACTION PHACO AND INTRAOCULAR LENS PLACEMENT LEFT EYE cde=8.58;  Surgeon: Tonny Branch, MD;  Location: [facility];  Service: Ophthalmology;  Laterality: Left;  . CATARACT EXTRACTION W/PHACO Right 05/15/2017   Procedure: CATARACT EXTRACTION PHACO AND INTRAOCULAR LENS PLACEMENT (IOC);  Surgeon: Tonny Branch, MD;  Location: [facility];  Service: Ophthalmology;  Laterality: Right;  CDE: 6.34  . COLONOSCOPY N/A 12/25/2013   Procedure: COLONOSCOPY;  Surgeon: Rogene Houston, MD;  [facility];  Service: Endoscopy;  Laterality: N/A;  730-rescheduled Ann notified pt  . COLONOSCOPY WITH PROPOFOL  N/A 10/04/2019   Procedure: COLONOSCOPY WITH PROPOFOL;  Surgeon: Rogene Houston, MD;  [facility];  Service: Endoscopy;  Laterality: N/A;  815  . CYST EXCISION N/A 09/12/2016   Procedure: EXCISION SEBACEOUS CYST, BACK;  Surgeon: Aviva Signs, MD;  Location: [facility];  Service: General;  Laterality: N/A;  . INTRAOCULAR LENS INSERTION Bilateral 2018   Dr. Tonny Branch   . POLYPECTOMY  10/04/2019   Procedure: POLYPECTOMY;  Surgeon: Rogene Houston, MD;  [facility];  Service: Endoscopy;;  . TUBAL LIGATION      There were no vitals filed for this visit.   Subjective Assessment - 12/03/20 1611    Subjective Pt stated she is expecting compression garments to arrive next week.  Stated her Lt heel hurts, pain scale 8/10 today.            WAs was from last reporting period of 4/21. Since last reassessment on 4/21 then pt went without compression  on Lt LE for one night and returned To clinic with extreme swelling and blisters all over her Lt LE which was the one that had the least amount of  Involvement.  We have decreased the edema and improved skin integrity since.     LYMPHEDEMA/ONCOLOGY QUESTIONNAIRE - 12/03/20 1844      Right Lower Extremity Lymphedema   30 cm Proximal to  Floor at Lateral Plantar Foot 40.8 cm   was 42 initally was 48.5   20 cm Proximal to Floor at Lateral Plantar Foot 28.5 1   was 32.4, initally was 40.5   10 cm Proximal to Floor at Lateral Malleoli 27.8 cm   was 29.5 initally was 30   Circumference of ankle/heel 34.6 cm. Was 35.7   5 cm Proximal to 1st MTP Joint 24.5 cm  Was 26   Across MTP Joint 24.8 cm was25     Left Lower Extremity Lymphedema   30 cm Proximal to Floor at Lateral Plantar Foot 46 cm   was 40 ; initially was 50.5   20 cm Proximal to Floor at Lateral Plantar Foot 36.8 cm   was 34 initally was 45   10 cm Proximal to Floor at Lateral Malleoli 30.3 cm   was 30  was initially 39   Circumference of ankle/heel 37 cm. Was 37.7   5  cm Proximal to 1st MTP Joint 25.4 cm was 26.8   Across MTP Joint 25.4 cm was25              Rt wound has healed we are now working on blistered areas of Lt LE      Wound Therapy - 12/03/20 1609    Subjective Pt stated she is expecting compression garments to arrive next week.  Stated her Lt heel hurts, pain scale 8/10 today.    Patient and Family Stated Goals wound to heal    Date of Onset 07/11/21    Prior Treatments unna boots, hospitalizations    Pain Scale 0-10    Evaluation and Treatment Procedures Explained to Patient/Family Yes    Evaluation and Treatment Procedures agreed to    Pressure Injury Properties Date First Assessed: 11/23/20 Time First Assessed: 1000 Location: Heel Location Orientation: Left Staging: Stage 1 -  Intact skin with non-blanchable redness of a localized area usually over a bony prominence. Wound Description (Comments): Lt heel Present on Admission: No   Dressing Type Abdominal pads;None    Dressing Clean;Dry    Peri-wound Assessment Intact    Drainage Amount None    Treatment Cleansed;Off loading    Wound Properties Date First Assessed: 11/16/20 Time First Assessed: 1050 Wound Type: Other (Comment) Location: Pretibial Location Orientation: Left Wound Description (Comments): multiple blisters on anterior, lateral, posterior and medial aspect of Lt LE Present on Admission: No   Wound Image View All Images View Images    Dressing Type Impregnated gauze (bismuth);Gauze (Comment)   xeroform, 4x4, ABD pad lateral aspect, kerlix   Dressing Changed Changed    Dressing Change Frequency PRN    Site / Wound Assessment Red;Pink    % Wound base Red or Granulating 100%    Peri-wound Assessment Edema    Drainage Amount Scant   lateral aspect primarly   Drainage Description Serous    Treatment Cleansed;Debridement (Selective)    Selective Debridement - Tissue Removed devitalized skin    Wound Therapy - Clinical Statement WAs was from last reporting period of  4/21. Since last reassessment on 4/21 then pt went without compression  on Lt LE for one night and returned To clinic with extreme swelling and blisters all over her Lt LE which was the one that had the least amount of  Involvement.  We have decreased the edema and improved skin integrity since. Wounds are progressing well wiht 100% granulation expect for lateral aspect (L1.8xW.6)and superior posterior (W1.5x:2cm) wounds.  No selective  debridment required on wounds, just dry skin perimeter of wounds.  Continued with xeroform over new skin and ABD pad on lateral aspect to address drainage with kerlix.    Wound Therapy - Functional Problem List unable to fit in shoes, unable to dress due to drainage of Rt LE    Factors Delaying/Impairing Wound Healing Altered sensation;Diabetes Mellitus;Infection - systemic/local;Polypharmacy    Wound Therapy - Frequency 3X / week    Wound Plan see below plan    Dressing  posterior aspect only xeroform, alginate and kerlix followed by foam and short stretch bandagig    Dressing short stretch compresion bandaging with foam                      PT Short Term Goals - 12/03/20 1618      PT SHORT TERM GOAL #1   Title Patient will be independent in self management strategies in order to reduce the risk of wound reoccurance.    Status On-going             PT Long Term Goals - 12/03/20 1618      PT LONG TERM GOAL #1   Title Patient will be able to don/doff compression garments independently in order to manage LE edema.    Baseline 12/03/20:  Reports compression garments to arrive next week    Status On-going      PT LONG TERM GOAL #2   Title PT to understand and verbalize that she needs to wear her compression garment and pump on a daily basis at D/C or edema will return    Baseline 12/03/20:  Pt does not remove dressings between treatment, reports she will receive compression garment next week.    Status On-going      PT LONG TERM GOAL #3    Title Patient will be able to wear her shoes again for improved ambulation ability.    Status On-going      PT LONG TERM GOAL #4   Title PT to have lost 6-8 cm in LE; 4 cm in foot to reduce risk of cellulitis    Status Achieved                 Plan - 12/03/20 1838    Clinical Impression Statement Pt limited by Lt heel pain, reviewed importance of reducing pressure on foot.  Wounds are progressing well with decrease in size and overall drainage, majority present with new skin and no drainage expect for lateral aspect and posterior, continues with xeroform and ABD pad on lateral aspect with kerlix prior multilayer short stretch bangages wiht 1/2in foam.  Pt reports compression garments expected to arrive next week.  MD apt scheduled at [facility] 11/24/20.    Personal Factors and Comorbidities Fitness;Behavior Pattern;Past/Current Experience;Comorbidity 3+;Time since onset of injury/illness/exacerbation;Transportation    Comorbidities lymphedema, HTN, obesity    Examination-Activity Limitations Bathing;Locomotion Level;Transfers;Stairs;Stand;Dressing;Hygiene/Grooming    Examination-Participation Restrictions Meal Prep;Community Activity;Volunteer;Yard Work;Shop;Laundry;Cleaning    Stability/Clinical Decision Making Evolving/Moderate complexity    Clinical Decision Making Moderate    Rehab Potential Good    PT Frequency 3x / week    PT Duration 6 weeks continue an additional 3 weeks for a total of 9 weeks    PT Treatment/Interventions ADLs/Self Care Home Management;Manual techniques;Manual lymph drainage;Compression bandaging;Gait training;Stair training;Functional mobility training;Therapeutic activities;Therapeutic exercise;Balance training;Neuromuscular re-education;Vasopneumatic Device;Taping;Splinting;Energy conservation;Passive range of motion;Scar mobilization;Patient/family education;DME Instruction    PT Next Visit Plan Continue with  lymphedema management, wound care as  needed.   Measure weekly.    PT Home Exercise Plan ankle pumps, elevation; 4/7:  see above    Consulted and Agree with Plan of Care Patient           Patient will benefit from skilled therapeutic intervention in order to improve the following deficits and impairments:  Abnormal gait,Cardiopulmonary status limiting activity,Decreased endurance,Decreased activity tolerance,Decreased skin integrity,Pain,Increased edema,Decreased mobility  Visit Diagnosis: Bilateral lower extremity edema  Lymphedema, not elsewhere classified  Generalized edema  Difficulty in walking, not elsewhere classified     Problem List Patient Active Problem List   Diagnosis Date Noted  . Abscess of right lower extremity excluding foot 10/18/2020  . Abscess of right leg 10/18/2020  . Dehydration 10/03/2020  . Leukocytosis 10/03/2020  . Thrombocytosis 10/03/2020  . Hyponatremia 10/03/2020  . Hypochloremia 10/03/2020  . Failure to thrive in adult 10/03/2020  . Acute renal failure superimposed on stage 3b chronic kidney disease ([place]) 10/03/2020  . Uncontrolled type 2 diabetes mellitus with hyperglycemia, without long-term current use of insulin ([place]) 10/03/2020  . Acute kidney injury superimposed on CKD ([place]) 10/02/2020  . Chronic kidney disease 10/01/2020  . 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[place]) 05/12/2020  . Chronic insomnia 01/08/2020  . Ocular myasthenia gravis ([place]) 09/18/2019  . Peripheral edema 01/23/2019  . Asthma 05/14/2015  . Heel spur 02/24/2015  . Depression 02/24/2015  . Post-menopausal bleeding 06/02/2014  . OA (osteoarthritis) of knee 04/09/2014  . Epidermal cyst 10/01/2013  . Class 3 obesity 02/25/2013  . Back pain 03/04/2012  . Gout 01/25/2012  . Edema 01/28/2011  . Obesity hypoventilation syndrome ([place]) 06/16/2008  . OBSTRUCTIVE SLEEP APNEA 05/08/2008  . Hyperlipidemia 08/28/2006  . Anxiety 08/28/2006  . Essential hypertension 08/28/2006  . GERD 08/28/2006  . Osteoarthritis 08/28/2006    Ihor Austin, LPTA/CLT; [place]  Rayetta Humphrey, [place] CLT [phone] 12/03/2020, 6:44 PM  [facility] [address] [place], [place], [address] Phone: [phone]   Fax:  [phone]  Name: SHEINA MCLEISH MRN: [id number] Date of Birth: 1951-07-02

## 2020-12-05 NOTE — Addendum Note (Signed)
Addended by: Leeroy Cha on: 12/05/2020 12:50 PM   Modules accepted: Orders

## 2020-12-06 ENCOUNTER — Other Ambulatory Visit: Payer: Self-pay | Admitting: Family Medicine

## 2020-12-07 ENCOUNTER — Ambulatory Visit (HOSPITAL_COMMUNITY): Payer: Medicare Other | Admitting: Physical Therapy

## 2020-12-07 ENCOUNTER — Other Ambulatory Visit: Payer: Self-pay

## 2020-12-07 DIAGNOSIS — R262 Difficulty in walking, not elsewhere classified: Secondary | ICD-10-CM

## 2020-12-07 DIAGNOSIS — R601 Generalized edema: Secondary | ICD-10-CM | POA: Diagnosis not present

## 2020-12-07 DIAGNOSIS — R600 Localized edema: Secondary | ICD-10-CM

## 2020-12-07 DIAGNOSIS — I890 Lymphedema, not elsewhere classified: Secondary | ICD-10-CM

## 2020-12-07 NOTE — Therapy (Signed)
[facility] [address] [place], [place], [address] Phone: [phone]   Fax:  [phone]  Physical Therapy Treatment  Patient Details  Name: [name] MRN: [id number] Date of Birth: 06/15/1951 No data recorded  Encounter Date: 12/07/2020   PT End of Session - 12/07/20 1713    Visit Number 19    Number of Visits 27    Date for PT Re-Evaluation 12/24/20    Authorization Type UHC Medicare (no visit limit, no auth)    Progress Note Due on Visit 27    PT Start Time 1400    PT Stop Time 1515    PT Time Calculation (min) 75 min    Activity Tolerance Patient tolerated treatment well    Behavior During Therapy WFL for tasks assessed/performed           Past Medical History:  Diagnosis Date  . Achilles tendinitis   . Anxiety   . Asthma   . Depression   . GERD (gastroesophageal reflux disease)   . Gout   . H/O myasthenia gravis    left eye  . HTN (hypertension)   . Hyperlipidemia   . Low back pain   . Obesity hypoventilation syndrome ([place])   . Obstructive sleep apnea   . Osteoarthritis     Past Surgical History:  Procedure Laterality Date  . CATARACT EXTRACTION W/PHACO Left 09/17/2015   Procedure: CATARACT EXTRACTION PHACO AND INTRAOCULAR LENS PLACEMENT LEFT EYE cde=8.58;  Surgeon: Tonny Branch, MD;  Location: [facility];  Service: Ophthalmology;  Laterality: Left;  . CATARACT EXTRACTION W/PHACO Right 05/15/2017   Procedure: CATARACT EXTRACTION PHACO AND INTRAOCULAR LENS PLACEMENT (IOC);  Surgeon: Tonny Branch, MD;  Location: [facility];  Service: Ophthalmology;  Laterality: Right;  CDE: 6.34  . COLONOSCOPY N/A 12/25/2013   Procedure: COLONOSCOPY;  Surgeon: Rogene Houston, MD;  [facility];  Service: Endoscopy;  Laterality: N/A;  730-rescheduled Ann notified pt  . COLONOSCOPY WITH PROPOFOL N/A 10/04/2019   Procedure: COLONOSCOPY WITH PROPOFOL;  Surgeon: Rogene Houston, MD;  [facility];  Service: Endoscopy;   Laterality: N/A;  815  . CYST EXCISION N/A 09/12/2016   Procedure: EXCISION SEBACEOUS CYST, BACK;  Surgeon: Aviva Signs, MD;  Location: [facility];  Service: General;  Laterality: N/A;  . INTRAOCULAR LENS INSERTION Bilateral 2018   Dr. Tonny Branch   . POLYPECTOMY  10/04/2019   Procedure: POLYPECTOMY;  Surgeon: Rogene Houston, MD;  [facility]: [facility];  Service: Endoscopy;;  . TUBAL LIGATION      There were no vitals filed for this visit.                    Wound Therapy - 12/07/20 0001    Subjective Pt states she is going to her assessment on the 23rd with her new MD> Requests order for juxtafit so she can have her fill it out as she fees that she would do better with the juxtafit.    Patient and Family Stated Goals wound to heal    Date of Onset 07/11/21    Prior Treatments unna boots, hospitalizations    Pain Scale 0-10    Pain Score 5     Pain Type Chronic pain    Pain Location Heel    Pain Orientation Left    Pain Descriptors / Indicators Burning    Evaluation and Treatment Procedures Explained to Patient/Family Yes    Evaluation and Treatment  Procedures agreed to    Pressure Injury Properties Date First Assessed: 11/23/20 Time First Assessed: 1000 Location: Heel Location Orientation: Left Staging: Stage 1 -  Intact skin with non-blanchable redness of a localized area usually over a bony prominence. Wound Description (Comments): Lt heel Present on Admission: No   Dressing Type Compression wrap    Dressing Clean;Dry    Peri-wound Assessment Intact    Drainage Amount None    Treatment Cleansed    Wound Properties Date First Assessed: 11/16/20 Time First Assessed: 1050 Wound Type: Other (Comment) Location: Pretibial Location Orientation: Left Wound Description (Comments): multiple blisters on anterior, lateral, posterior and medial aspect of Lt LE Present on Admission: No   Dressing Type Impregnated gauze (petrolatum)    Dressing Changed Changed    Dressing Change  Frequency PRN    Site / Wound Assessment Pink    % Wound base Red or Granulating 100%    Peri-wound Assessment Edema    Drainage Amount Scant    Drainage Description Serous    Treatment Cleansed;Debridement (Selective)    Wound Therapy - Clinical Statement Wounds are almost completely healed.  Placed xeroform as there is a small amount of weeping coming from Pt LE still this will help to stop any sticking when removing the dressing.  Pt given order for juxtafit and told to bring her compression garment in so we can practice donning on her Rt LE as this volume is stabilized and as soon as she has some type of compression garment we can stopped short stretch bandaging.    Wound Therapy - Functional Problem List unable to fit in shoes, unable to dress due to drainage of Rt LE    Factors Delaying/Impairing Wound Healing Altered sensation;Diabetes Mellitus;Infection - systemic/local;Polypharmacy    Wound Therapy - Frequency 3X / week    Wound Plan see below plan    Dressing  Lt LE :  xeroform, 4x4 and kerlix.    Dressing short stretch bandages            [facility] Adult PT Treatment/Exercise - 12/07/20 0001      Manual Therapy   Manual Therapy Manual Lymphatic Drainage (MLD);Compression Bandaging;Other (comment)    Manual therapy comments completed seperate from all other aspects of treatment.    Manual Lymphatic Drainage (MLD) to include supraclavicular, deep and superfical abdominal, inguinal/axillary anastomosis and B LE.  Posterior completed sidelying for comfort.    Compression Bandaging foam and multilayer short stretch bandaging                    PT Short Term Goals - 12/03/20 1618      PT SHORT TERM GOAL #1   Title Patient will be independent in self management strategies in order to reduce the risk of wound reoccurance.    Status On-going             PT Long Term Goals - 12/03/20 1618      PT LONG TERM GOAL #1   Title Patient will be able to don/doff compression  garments independently in order to manage LE edema.    Baseline 12/03/20:  Reports compression garments to arrive next week    Status On-going      PT LONG TERM GOAL #2   Title PT to understand and verbalize that she needs to wear her compression garment and pump on a daily basis at D/C or edema will return    Baseline 12/03/20:  Pt does not remove  dressings between treatment, reports she will receive compression garment next week.    Status On-going      PT LONG TERM GOAL #3   Title Patient will be able to wear her shoes again for improved ambulation ability.    Status On-going      PT LONG TERM GOAL #4   Title PT to have lost 6-8 cm in LE; 4 cm in foot to reduce risk of cellulitis    Status Achieved                 Plan - 12/07/20 1713    Clinical Impression Statement see above    Personal Factors and Comorbidities Fitness;Behavior Pattern;Past/Current Experience;Comorbidity 3+;Time since onset of injury/illness/exacerbation;Transportation    Comorbidities lymphedema, HTN, obesity    Examination-Activity Limitations Bathing;Locomotion Level;Transfers;Stairs;Stand;Dressing;Hygiene/Grooming    Examination-Participation Restrictions Meal Prep;Community Activity;Volunteer;Yard Work;Shop;Laundry;Cleaning    Stability/Clinical Decision Making Evolving/Moderate complexity    Rehab Potential Good    PT Frequency 3x / week    PT Duration 6 weeks    PT Treatment/Interventions ADLs/Self Care Home Management;Manual techniques;Manual lymph drainage;Compression bandaging;Gait training;Stair training;Functional mobility training;Therapeutic activities;Therapeutic exercise;Balance training;Neuromuscular re-education;Vasopneumatic Device;Taping;Splinting;Energy conservation;Passive range of motion;Scar mobilization;Patient/family education;DME Instruction    PT Next Visit Plan Continue with  lymphedema management, wound care as needed.  Measure weekly.    PT Home Exercise Plan ankle pumps,  elevation; 4/7:  see above    Consulted and Agree with Plan of Care Patient           Patient will benefit from skilled therapeutic intervention in order to improve the following deficits and impairments:  Abnormal gait,Cardiopulmonary status limiting activity,Decreased endurance,Decreased activity tolerance,Decreased skin integrity,Pain,Increased edema,Decreased mobility  Visit Diagnosis: Bilateral lower extremity edema  Lymphedema, not elsewhere classified  Difficulty in walking, not elsewhere classified     Problem List Patient Active Problem List   Diagnosis Date Noted  . Abscess of right lower extremity excluding foot 10/18/2020  . Abscess of right leg 10/18/2020  . Dehydration 10/03/2020  . Leukocytosis 10/03/2020  . Thrombocytosis 10/03/2020  . Hyponatremia 10/03/2020  . Hypochloremia 10/03/2020  . Failure to thrive in adult 10/03/2020  . Acute renal failure superimposed on stage 3b chronic kidney disease ([place]) 10/03/2020  . Uncontrolled type 2 diabetes mellitus with hyperglycemia, without long-term current use of insulin ([place]) 10/03/2020  . Acute kidney injury superimposed on CKD ([place]) 10/02/2020  . Chronic kidney disease 10/01/2020  . 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[place]) 05/12/2020  . Chronic insomnia 01/08/2020  . Ocular myasthenia gravis ([place]) 09/18/2019  . Peripheral edema 01/23/2019  . Asthma 05/14/2015  . Heel spur 02/24/2015  . Depression 02/24/2015  . Post-menopausal bleeding 06/02/2014  . OA (osteoarthritis) of knee 04/09/2014  . Epidermal cyst 10/01/2013  . Class 3 obesity 02/25/2013  . Back pain 03/04/2012  . Gout 01/25/2012  . Edema 01/28/2011  . Obesity hypoventilation syndrome ([place]) 06/16/2008  . OBSTRUCTIVE SLEEP APNEA 05/08/2008  . Hyperlipidemia 08/28/2006  . Anxiety 08/28/2006  . Essential hypertension 08/28/2006  . GERD 08/28/2006  . Osteoarthritis 08/28/2006    Rayetta Humphrey, PT CLT [phone] 12/07/2020, 5:14 PM  [facility] [address] [place], [place], [address] Phone: [phone]   Fax:  [phone]  Name: [name] MRN: [id number] Date of Birth: 05/04/1951

## 2020-12-11 ENCOUNTER — Encounter (HOSPITAL_COMMUNITY): Payer: Self-pay

## 2020-12-11 ENCOUNTER — Ambulatory Visit (HOSPITAL_COMMUNITY): Payer: Medicare Other

## 2020-12-11 ENCOUNTER — Other Ambulatory Visit: Payer: Self-pay

## 2020-12-11 DIAGNOSIS — I890 Lymphedema, not elsewhere classified: Secondary | ICD-10-CM

## 2020-12-11 DIAGNOSIS — R600 Localized edema: Secondary | ICD-10-CM

## 2020-12-11 DIAGNOSIS — R601 Generalized edema: Secondary | ICD-10-CM | POA: Diagnosis not present

## 2020-12-11 DIAGNOSIS — R262 Difficulty in walking, not elsewhere classified: Secondary | ICD-10-CM

## 2020-12-11 NOTE — Therapy (Signed)
[facility] [place], [place], [address] Phone: [phone]   Fax:  [phone]  Physical Therapy Treatment  Patient Details  Name: [name] MRN: [id number] Date of Birth: [birth year] No data recorded  Encounter Date: 12/11/2020   PT End of Session - 12/11/20 1703    Visit Number 20    Number of Visits 27    Date for PT Re-Evaluation 12/24/20    Authorization Type UHC Medicare (no visit limit, no auth)    Progress Note Due on Visit 27    PT Start Time 1535    PT Stop Time 1650    PT Time Calculation (min) 75 min    Activity Tolerance Patient tolerated treatment well    Behavior During Therapy WFL for tasks assessed/performed           Past Medical History:  Diagnosis Date  . Achilles tendinitis   . Anxiety   . Asthma   . Depression   . GERD (gastroesophageal reflux disease)   . Gout   . H/O myasthenia gravis    left eye  . HTN (hypertension)   . Hyperlipidemia   . Low back pain   . Obesity hypoventilation syndrome ([place])   . Obstructive sleep apnea   . Osteoarthritis     Past Surgical History:  Procedure Laterality Date  . CATARACT EXTRACTION W/PHACO Left 09/17/2015   Procedure: CATARACT EXTRACTION PHACO AND INTRAOCULAR LENS PLACEMENT LEFT EYE cde=8.58;  Surgeon: Tonny Branch, MD;  Location: [facility];  Service: Ophthalmology;  Laterality: Left;  . CATARACT EXTRACTION W/PHACO Right 05/15/2017   Procedure: CATARACT EXTRACTION PHACO AND INTRAOCULAR LENS PLACEMENT (IOC);  Surgeon: Tonny Branch, MD;  Location: [facility];  Service: Ophthalmology;  Laterality: Right;  CDE: 6.34  . COLONOSCOPY N/A 12/25/2013   Procedure: COLONOSCOPY;  Surgeon: Rogene Houston, MD;  [facility];  Service: Endoscopy;  Laterality: N/A;  730-rescheduled Ann notified pt  . COLONOSCOPY WITH PROPOFOL N/A 10/04/2019   Procedure: COLONOSCOPY WITH PROPOFOL;  Surgeon: Rogene Houston, MD;  [facility];  Service: Endoscopy;   Laterality: N/A;  815  . CYST EXCISION N/A 09/12/2016   Procedure: EXCISION SEBACEOUS CYST, BACK;  Surgeon: Aviva Signs, MD;  Location: [facility];  Service: General;  Laterality: N/A;  . INTRAOCULAR LENS INSERTION Bilateral 2018   Dr. Tonny Branch   . POLYPECTOMY  10/04/2019   Procedure: POLYPECTOMY;  Surgeon: Rogene Houston, MD;  [facility];  Service: Endoscopy;;  . TUBAL LIGATION      There were no vitals filed for this visit.   Subjective Assessment - 12/11/20 1656    Subjective Pt stated both her heels are hurting today.  Brought in compression garments with her this session.    Pertinent History chronic edema in LE with wounds causing hospitalization, ( most likely lymphedema), gout, LBP, DM,    Currently in Pain? Yes    Pain Score 8     Pain Location Heel    Pain Orientation Right;Left    Pain Descriptors / Indicators Burning    Pain Type Chronic pain    Pain Onset 1 to 4 weeks ago    Pain Frequency Intermittent    Aggravating Factors  dependent position    Pain Relieving Factors elevation    Effect of Pain on Daily Activities limits  Wound Therapy - 12/11/20 1656    Subjective Pt stated both her heels are hurting today.  Brought in compression garments with her this session.    Patient and Family Stated Goals wound to heal    Date of Onset 07/11/21    Prior Treatments unna boots, hospitalizations    Pain Scale 0-10    Evaluation and Treatment Procedures Explained to Patient/Family Yes    Evaluation and Treatment Procedures agreed to    Pressure Injury Properties Date First Assessed: 11/23/20 Time First Assessed: 1000 Location: Heel Location Orientation: Left Staging: Stage 1 -  Intact skin with non-blanchable redness of a localized area usually over a bony prominence. Wound Description (Comments): Lt heel Present on Admission: No   Peri-wound Assessment Intact    Drainage Amount None    Treatment Cleansed    Wound  Properties Date First Assessed: 11/16/20 Time First Assessed: 1050 Wound Type: Other (Comment) Location: Pretibial Location Orientation: Left Wound Description (Comments): multiple blisters on anterior, lateral, posterior and medial aspect of Lt LE Present on Admission: No   Dressing Type Impregnated gauze (petrolatum)    Dressing Changed Changed    Dressing Change Frequency PRN    Site / Wound Assessment Pink    % Wound base Red or Granulating 100%    Peri-wound Assessment Edema    Drainage Amount Scant    Drainage Description Serous    Treatment Cleansed    Selective Debridement - Location Lt foot    Selective Debridement - Tools Used Forceps    Selective Debridement - Tissue Removed devitalized and macerated skin    Wound Therapy - Clinical Statement Bandages saturated on dorsal and lateral aspect.  Upon removal of bandages noted increased maceration on dorsal aspect and 5th toe.  Selective debridement for removal of dead skin.  Add vaseline on toes, xeroform and alginate to address drainage.  Other wounds have healed, continued wiht xeroform on them to reduce pulling of new skin upon removal of bandages.    Wound Therapy - Functional Problem List unable to fit in shoes, unable to dress due to drainage of Rt LE    Factors Delaying/Impairing Wound Healing Altered sensation;Diabetes Mellitus;Infection - systemic/local;Polypharmacy    Wound Therapy - Frequency 3X / week    Wound Plan see below plan    Dressing  Lt LE :  xeroform, 4x4 and kerlix.; Lt food with alginate and xeroform, 3' gauze.    Dressing multilayer short stretch bandages            [facility] Adult PT Treatment/Exercise - 12/11/20 0001      Manual Therapy   Manual Therapy Manual Lymphatic Drainage (MLD);Compression Bandaging;Other (comment)    Manual therapy comments completed seperate from all other aspects of treatment.    Manual Lymphatic Drainage (MLD) to include supraclavicular, deep and superfical abdominal,  inguinal/axillary anastomosis and B LE.  Posterior completed sidelying for comfort.    Compression Bandaging foam and multilayer short stretch bandaging                    PT Short Term Goals - 12/03/20 1618      PT SHORT TERM GOAL #1   Title Patient will be independent in self management strategies in order to reduce the risk of wound reoccurance.    Status On-going             PT Long Term Goals - 12/03/20 1618      PT LONG TERM GOAL #1  Title Patient will be able to don/doff compression garments independently in order to manage LE edema.    Baseline 12/03/20:  Reports compression garments to arrive next week    Status On-going      PT LONG TERM GOAL #2   Title PT to understand and verbalize that she needs to wear her compression garment and pump on a daily basis at D/C or edema will return    Baseline 12/03/20:  Pt does not remove dressings between treatment, reports she will receive compression garment next week.    Status On-going      PT LONG TERM GOAL #3   Title Patient will be able to wear her shoes again for improved ambulation ability.    Status On-going      PT LONG TERM GOAL #4   Title PT to have lost 6-8 cm in LE; 4 cm in foot to reduce risk of cellulitis    Status Achieved                 Plan - 12/11/20 1704    Clinical Impression Statement Bandages were saturated through dressings on Lt dorsal aspect and lateral foot.  New wound present with macerated skin perimeter (picutre taken in media), see wound note above for further details.  Pt brought in new compression garments that were 15-20 mmHg.  Pt educated that increased compression would be beneficial to assist wiht edema control and given paperwork to assure she gets 20-73mmHg.  Pt stated she will call Monday morning.  Pt stated she knows how to donn garments, has butler at home.    Personal Factors and Comorbidities Fitness;Behavior Pattern;Past/Current Experience;Comorbidity 3+;Time since  onset of injury/illness/exacerbation;Transportation    Comorbidities lymphedema, HTN, obesity    Examination-Activity Limitations Bathing;Locomotion Level;Transfers;Stairs;Stand;Dressing;Hygiene/Grooming    Examination-Participation Restrictions Meal Prep;Community Activity;Volunteer;Yard Work;Shop;Laundry;Cleaning    Stability/Clinical Decision Making Evolving/Moderate complexity    Clinical Decision Making Moderate    Rehab Potential Good    PT Frequency 3x / week    PT Duration 6 weeks    PT Treatment/Interventions ADLs/Self Care Home Management;Manual techniques;Manual lymph drainage;Compression bandaging;Gait training;Stair training;Functional mobility training;Therapeutic activities;Therapeutic exercise;Balance training;Neuromuscular re-education;Vasopneumatic Device;Taping;Splinting;Energy conservation;Passive range of motion;Scar mobilization;Patient/family education;DME Instruction    PT Next Visit Plan Continue with  lymphedema management, wound care as needed.  Measure weekly.    PT Home Exercise Plan ankle pumps, elevation; 4/7:  see above    Consulted and Agree with Plan of Care Patient           Patient will benefit from skilled therapeutic intervention in order to improve the following deficits and impairments:  Abnormal gait,Cardiopulmonary status limiting activity,Decreased endurance,Decreased activity tolerance,Decreased skin integrity,Pain,Increased edema,Decreased mobility  Visit Diagnosis: Bilateral lower extremity edema  Lymphedema, not elsewhere classified  Difficulty in walking, not elsewhere classified  Generalized edema     Problem List Patient Active Problem List   Diagnosis Date Noted  . Abscess of right lower extremity excluding foot 10/18/2020  . Abscess of right leg 10/18/2020  . Dehydration 10/03/2020  . Leukocytosis 10/03/2020  . Thrombocytosis 10/03/2020  . Hyponatremia 10/03/2020  . Hypochloremia 10/03/2020  . Failure to thrive in adult  10/03/2020  . Acute renal failure superimposed on stage 3b chronic kidney disease ([place]) 10/03/2020  . Uncontrolled type 2 diabetes mellitus with hyperglycemia, without long-term current use of insulin ([place]) 10/03/2020  . Acute kidney injury superimposed on CKD ([place]) 10/02/2020  . Chronic kidney disease 10/01/2020  . 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[place]) 05/12/2020  .  Chronic insomnia 01/08/2020  . Ocular myasthenia gravis ([place]) 09/18/2019  . Peripheral edema 01/23/2019  . Asthma 05/14/2015  . Heel spur 02/24/2015  . Depression 02/24/2015  . Post-menopausal bleeding 06/02/2014  . OA (osteoarthritis) of knee 04/09/2014  . Epidermal cyst 10/01/2013  . Class 3 obesity 02/25/2013  . Back pain 03/04/2012  . Gout 01/25/2012  . Edema 01/28/2011  . Obesity hypoventilation syndrome ([place]) 06/16/2008  . OBSTRUCTIVE SLEEP APNEA 05/08/2008  . Hyperlipidemia 08/28/2006  . Anxiety 08/28/2006  . Essential hypertension 08/28/2006  . GERD 08/28/2006  . Osteoarthritis 08/28/2006   Ihor Austin, LPTA/CLT; CBIS [phone]  Aldona Lento 12/11/2020, 5:11 PM  [facility] [address] [place], [place], [address] Phone: [phone]   Fax:  [phone]  Name: [name] MRN: [id number] Date of Birth: 06/16/1951

## 2020-12-13 ENCOUNTER — Other Ambulatory Visit: Payer: Self-pay | Admitting: Family Medicine

## 2020-12-14 ENCOUNTER — Ambulatory Visit (HOSPITAL_COMMUNITY): Payer: Medicare Other | Admitting: Physical Therapy

## 2020-12-14 ENCOUNTER — Other Ambulatory Visit: Payer: Self-pay

## 2020-12-14 DIAGNOSIS — R262 Difficulty in walking, not elsewhere classified: Secondary | ICD-10-CM

## 2020-12-14 DIAGNOSIS — R600 Localized edema: Secondary | ICD-10-CM

## 2020-12-14 DIAGNOSIS — R601 Generalized edema: Secondary | ICD-10-CM

## 2020-12-14 DIAGNOSIS — I890 Lymphedema, not elsewhere classified: Secondary | ICD-10-CM

## 2020-12-14 NOTE — Therapy (Signed)
[place], [place], [address] Phone: [phone]   Fax:  [phone]  Wound Care Therapy  Patient Details  Name: [name] MRN: [id number] Date of Birth: 21-Oct-1950 No data recorded  Encounter Date: 12/14/2020   PT End of Session - 12/14/20 1408    Visit Number 21    Number of Visits 27    Date for PT Re-Evaluation 12/24/20    Authorization Type UHC Medicare (no visit limit, no auth)    Progress Note Due on Visit 27    PT Start Time 1130    PT Stop Time 1245    PT Time Calculation (min) 75 min    Activity Tolerance Patient tolerated treatment well    Behavior During Therapy WFL for tasks assessed/performed           Past Medical History:  Diagnosis Date  . Achilles tendinitis   . Anxiety   . Asthma   . Depression   . GERD (gastroesophageal reflux disease)   . Gout   . H/O myasthenia gravis    left eye  . HTN (hypertension)   . Hyperlipidemia   . Low back pain   . Obesity hypoventilation syndrome ([place])   . Obstructive sleep apnea   . Osteoarthritis     Past Surgical History:  Procedure Laterality Date  . CATARACT EXTRACTION W/PHACO Left 09/17/2015   Procedure: CATARACT EXTRACTION PHACO AND INTRAOCULAR LENS PLACEMENT LEFT EYE cde=8.58;  Surgeon: Tonny Branch, MD;  Location: [facility];  Service: Ophthalmology;  Laterality: Left;  . CATARACT EXTRACTION W/PHACO Right 05/15/2017   Procedure: CATARACT EXTRACTION PHACO AND INTRAOCULAR LENS PLACEMENT (IOC);  Surgeon: Tonny Branch, MD;  Location: [facility];  Service: Ophthalmology;  Laterality: Right;  CDE: 6.34  . COLONOSCOPY N/A 12/25/2013   Procedure: COLONOSCOPY;  Surgeon: Rogene Houston, MD;  [facility];  Service: Endoscopy;  Laterality: N/A;  730-rescheduled Ann notified pt  . COLONOSCOPY WITH PROPOFOL N/A 10/04/2019   Procedure: COLONOSCOPY WITH PROPOFOL;  Surgeon: Rogene Houston, MD;  [facility];  Service: Endoscopy;   Laterality: N/A;  815  . CYST EXCISION N/A 09/12/2016   Procedure: EXCISION SEBACEOUS CYST, BACK;  Surgeon: Aviva Signs, MD;  Location: [facility];  Service: General;  Laterality: N/A;  . INTRAOCULAR LENS INSERTION Bilateral 2018   Dr. Tonny Branch   . POLYPECTOMY  10/04/2019   Procedure: POLYPECTOMY;  Surgeon: Rogene Houston, MD;  [facility];  Service: Endoscopy;;  . TUBAL LIGATION      There were no vitals filed for this visit.    Subjective Assessment - 12/14/20 1355    Subjective Pt continues to complain of heel pain; pt again states that she is scooting with her heels in the wheelchair at home.  Therapist expalained that she walks into the department she should be walking in her home but the pt is adament that she is fearful and is going to continue to use the wheelchair.    Pertinent History chronic edema in LE with wounds causing hospitalization, ( most likely lymphedema), gout, LBP, DM,    Limitations House hold activities;Walking    Currently in Pain? Yes    Pain Score 8     Pain Location Heel    Pain Orientation Right;Left                     Wound Therapy - 12/14/20 1355  Subjective see above    Patient and Family Stated Goals wound to heal    Date of Onset 07/11/21    Prior Treatments unna boots, hospitalizations    Pain Descriptors / Indicators Constant;Burning    Evaluation and Treatment Procedures Explained to Patient/Family Yes    Evaluation and Treatment Procedures agreed to    Pressure Injury Properties Date First Assessed: 11/23/20 Time First Assessed: 1000 Location: Heel Location Orientation: Left Staging: Stage 1 -  Intact skin with non-blanchable redness of a localized area usually over a bony prominence. Wound Description (Comments): Lt heel Present on Admission: No   Peri-wound Assessment Edema;Erythema (blanchable)    Treatment Cleansed    Wound Properties Date First Assessed: 11/16/20 Time First Assessed: 1050 Wound Type: Other  (Comment) Location: Pretibial Location Orientation: Left Wound Description (Comments): multiple blisters on anterior, lateral, posterior and medial aspect of Lt LE Present on Admission: No   Dressing Type Impregnated gauze (bismuth)   alginate to dorsal wound   Dressing Changed Changed    Dressing Change Frequency PRN    Site / Wound Assessment Pink    % Wound base Red or Granulating 100%    Peri-wound Assessment Edema    Wound Therapy - Clinical Statement Pt brought 15-52mmhg compression into department which wont be enough to contain pt.  Therapist called elastic therapy to let them knwo that they had sent the wrong compression, however, they are closed on Monday.  Urged pt to call tommorrow and let them know that they made the mistake. Bandages continue to be wet therefore pt added alginate and abpad to forefoot on LT.  PT heels are very red and swollen but pt refuses to stop pulling herself in her wheelchair at home by leg walking.    Wound Therapy - Functional Problem List unable to fit in shoes, unable to dress due to drainage of Rt LE    Factors Delaying/Impairing Wound Healing Altered sensation;Diabetes Mellitus;Infection - systemic/local;Polypharmacy    Wound Therapy - Frequency 3X / week    Wound Plan Continue to talk to pt about not using feet as a propulsion means in her wheelchair.  Assess wounds.  Urge pt to get right compression for compression garment.    Dressing  B multilayer shortstretch with 1/2 " foam.  Lt w/alginate on dorsal aspect of foot and xeroform at other small openings,           [facility] Adult PT Treatment/Exercise - 12/14/20 0001      Manual Therapy   Manual Therapy Manual Lymphatic Drainage (MLD);Compression Bandaging;Other (comment)    Manual therapy comments completed seperate from all other aspects of treatment.    Manual Lymphatic Drainage (MLD) to LE only Rt ankle had extra time as it was very swollen    Compression Bandaging foam and multilayer short stretch  bandaging    Other Manual Therapy bandages had to be removed and rolled.                    PT Short Term Goals - 12/03/20 1618      PT SHORT TERM GOAL #1   Title Patient will be independent in self management strategies in order to reduce the risk of wound reoccurance.    Status On-going             PT Long Term Goals - 12/03/20 1618      PT LONG TERM GOAL #1   Title Patient will be able to don/doff compression  garments independently in order to manage LE edema.    Baseline 12/03/20:  Reports compression garments to arrive next week    Status On-going      PT LONG TERM GOAL #2   Title PT to understand and verbalize that she needs to wear her compression garment and pump on a daily basis at D/C or edema will return    Baseline 12/03/20:  Pt does not remove dressings between treatment, reports she will receive compression garment next week.    Status On-going      PT LONG TERM GOAL #3   Title Patient will be able to wear her shoes again for improved ambulation ability.    Status On-going      PT LONG TERM GOAL #4   Title PT to have lost 6-8 cm in LE; 4 cm in foot to reduce risk of cellulitis    Status Achieved                 Plan - 12/14/20 1408    Clinical Impression Statement see above    Personal Factors and Comorbidities Fitness;Behavior Pattern;Past/Current Experience;Comorbidity 3+;Time since onset of injury/illness/exacerbation;Transportation    Comorbidities lymphedema, HTN, obesity    Examination-Activity Limitations Bathing;Locomotion Level;Transfers;Stairs;Stand;Dressing;Hygiene/Grooming    Examination-Participation Restrictions Meal Prep;Community Activity;Volunteer;Yard Work;Shop;Laundry;Cleaning    Stability/Clinical Decision Making Evolving/Moderate complexity    Clinical Decision Making Moderate    Rehab Potential Good    PT Frequency 3x / week    PT Duration 6 weeks    PT Treatment/Interventions ADLs/Self Care Home Management;Manual  techniques;Manual lymph drainage;Compression bandaging;Gait training;Stair training;Functional mobility training;Therapeutic activities;Therapeutic exercise;Balance training;Neuromuscular re-education;Vasopneumatic Device;Taping;Splinting;Energy conservation;Passive range of motion;Scar mobilization;Patient/family education;DME Instruction    PT Next Visit Plan Continue with  lymphedema management, wound care as needed.  Measure weekly.    PT Home Exercise Plan ankle pumps, elevation; 4/7:  see above           Patient will benefit from skilled therapeutic intervention in order to improve the following deficits and impairments:  Abnormal gait,Cardiopulmonary status limiting activity,Decreased endurance,Decreased activity tolerance,Decreased skin integrity,Pain,Increased edema,Decreased mobility  Visit Diagnosis: Lymphedema, not elsewhere classified  Bilateral lower extremity edema  Difficulty in walking, not elsewhere classified  Generalized edema     Problem List Patient Active Problem List   Diagnosis Date Noted  . Abscess of right lower extremity excluding foot 10/18/2020  . Abscess of right leg 10/18/2020  . Dehydration 10/03/2020  . Leukocytosis 10/03/2020  . Thrombocytosis 10/03/2020  . Hyponatremia 10/03/2020  . Hypochloremia 10/03/2020  . Failure to thrive in adult 10/03/2020  . Acute renal failure superimposed on stage 3b chronic kidney disease ([place]) 10/03/2020  . Uncontrolled type 2 diabetes mellitus with hyperglycemia, without long-term current use of insulin ([place]) 10/03/2020  . Acute kidney injury superimposed on CKD ([place]) 10/02/2020  . Chronic kidney disease 10/01/2020  . 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[place]) 05/12/2020  . Chronic insomnia 01/08/2020  . Ocular myasthenia gravis ([place]) 09/18/2019  . Peripheral edema 01/23/2019  . Asthma 05/14/2015  . Heel spur 02/24/2015  . Depression 02/24/2015  . Post-menopausal bleeding 06/02/2014  . OA (osteoarthritis) of  knee 04/09/2014  . Epidermal cyst 10/01/2013  . Class 3 obesity 02/25/2013  . Back pain 03/04/2012  . Gout 01/25/2012  . Edema 01/28/2011  . Obesity hypoventilation syndrome ([place]) 06/16/2008  . OBSTRUCTIVE SLEEP APNEA 05/08/2008  . Hyperlipidemia 08/28/2006  . Anxiety 08/28/2006  . Essential hypertension 08/28/2006  . GERD 08/28/2006  . Osteoarthritis 08/28/2006  Rayetta Humphrey, PT CLT [phone] 12/14/2020, 2:10 PM  [facility] [address] [place], [place], [address] Phone: [phone]   Fax:  [phone]  Name: [name] MRN: [id number] Date of Birth: 1951/06/02

## 2020-12-16 ENCOUNTER — Ambulatory Visit (HOSPITAL_COMMUNITY): Payer: Medicare Other | Admitting: Physical Therapy

## 2020-12-16 ENCOUNTER — Other Ambulatory Visit: Payer: Self-pay

## 2020-12-16 DIAGNOSIS — R600 Localized edema: Secondary | ICD-10-CM

## 2020-12-16 DIAGNOSIS — R601 Generalized edema: Secondary | ICD-10-CM | POA: Diagnosis not present

## 2020-12-16 DIAGNOSIS — I890 Lymphedema, not elsewhere classified: Secondary | ICD-10-CM

## 2020-12-16 DIAGNOSIS — R262 Difficulty in walking, not elsewhere classified: Secondary | ICD-10-CM

## 2020-12-16 NOTE — Therapy (Signed)
[place], [place], [address] Phone: [phone]   Fax:  [phone]  Physical Therapy Treatment  Patient Details  Name: [name] MRN: [id number] Date of Birth: Jul 29, 1950 No data recorded  Encounter Date: 12/16/2020   PT End of Session - 12/16/20 1454    Visit Number 22    Number of Visits 27    Date for PT Re-Evaluation 12/24/20    Authorization Type UHC Medicare (no visit limit, no auth)    Progress Note Due on Visit 27    PT Start Time 1315    PT Stop Time 1430    PT Time Calculation (min) 75 min    Activity Tolerance Patient tolerated treatment well    Behavior During Therapy WFL for tasks assessed/performed           Past Medical History:  Diagnosis Date  . Achilles tendinitis   . Anxiety   . Asthma   . Depression   . GERD (gastroesophageal reflux disease)   . Gout   . H/O myasthenia gravis    left eye  . HTN (hypertension)   . Hyperlipidemia   . Low back pain   . Obesity hypoventilation syndrome ([place])   . Obstructive sleep apnea   . Osteoarthritis     Past Surgical History:  Procedure Laterality Date  . CATARACT EXTRACTION W/PHACO Left 09/17/2015   Procedure: CATARACT EXTRACTION PHACO AND INTRAOCULAR LENS PLACEMENT LEFT EYE cde=8.58;  Surgeon: Tonny Branch, MD;  Location: [facility];  Service: Ophthalmology;  Laterality: Left;  . CATARACT EXTRACTION W/PHACO Right 05/15/2017   Procedure: CATARACT EXTRACTION PHACO AND INTRAOCULAR LENS PLACEMENT (IOC);  Surgeon: Tonny Branch, MD;  Location: [facility];  Service: Ophthalmology;  Laterality: Right;  CDE: 6.34  . COLONOSCOPY N/A 12/25/2013   Procedure: COLONOSCOPY;  Surgeon: Rogene Houston, MD;  [facility];  Service: Endoscopy;  Laterality: N/A;  730-rescheduled Ann notified pt  . COLONOSCOPY WITH PROPOFOL N/A 10/04/2019   Procedure: COLONOSCOPY WITH PROPOFOL;  Surgeon: Rogene Houston, MD;  [facility];  Service: Endoscopy;   Laterality: N/A;  815  . CYST EXCISION N/A 09/12/2016   Procedure: EXCISION SEBACEOUS CYST, BACK;  Surgeon: Aviva Signs, MD;  Location: [facility];  Service: General;  Laterality: N/A;  . INTRAOCULAR LENS INSERTION Bilateral 2018   Dr. Tonny Branch   . POLYPECTOMY  10/04/2019   Procedure: POLYPECTOMY;  Surgeon: Rogene Houston, MD;  [facility];  Service: Endoscopy;;  . TUBAL LIGATION      There were no vitals filed for this visit.   Subjective Assessment - 12/16/20 1442    Subjective pt states she is doing well today.  STates she did not call [facility] but is planning on calling [facility] to get measured for juxtas.  STates she is using her walker in her home and not using a wheelchair.  Pt does, however sleep in a recliner or on her couch.                 LYMPHEDEMA/ONCOLOGY QUESTIONNAIRE - 12/16/20 1459      Right Lower Extremity Lymphedema   30 cm Proximal to Floor at Lateral Plantar Foot 37.8 cm   was 48.5 on 4/1   20 cm Proximal to Floor at Lateral Plantar Foot 31.4 1   was 40.5 on 4/1   10 cm Proximal to Floor at Lateral Malleoli 27.5 cm   was 30 on 4/1  Circumference of ankle/heel 36 cm.   was 36.7 on 4/7   5 cm Proximal to 1st MTP Joint 27 cm   was 26.8 on 4/7   Across MTP Joint 25 cm   was 25.6 on 4/14     Left Lower Extremity Lymphedema   30 cm Proximal to Floor at Lateral Plantar Foot 40.4 cm   was 50.5 on 4/1   20 cm Proximal to Floor at Lateral Plantar Foot 33.7 cm   was 45 on 4/1   10 cm Proximal to Floor at Lateral Malleoli 31 cm   was 33  was initially 39   Circumference of ankle/heel 38.7 cm.   was 38 on 4/7   5 cm Proximal to 1st MTP Joint 27 cm   was 29.1 on 4/7   Across MTP Joint 26.5 cm   was 29 on 4/7                   Wound Therapy - 12/16/20 0001    Subjective no pain unless direct pressure on heels    Patient and Family Stated Goals wound to heal    Date of Onset 07/11/21    Prior Treatments unna boots, hospitalizations     Pain Scale 0-10    Pain Score 0-No pain    Evaluation and Treatment Procedures Explained to Patient/Family Yes    Evaluation and Treatment Procedures agreed to    Pressure Injury Properties Date First Assessed: 11/23/20 Time First Assessed: 1000 Location: Heel Location Orientation: Left Staging: Stage 1 -  Intact skin with non-blanchable redness of a localized area usually over a bony prominence. Wound Description (Comments): Lt heel Present on Admission: No   Wound Image View All Images View Images    Treatment Cleansed    Wound Properties Date First Assessed: 12/16/20 Time First Assessed: 0130 Location: Foot Location Orientation: Anterior;Left   Wound Image View All Images View Images    Dressing Type Silver hydrofiber    Dressing Changed Changed    Dressing Status Intact    Dressing Change Frequency PRN    Site / Wound Assessment Pale;Pink    % Wound base Red or Granulating 95%    % Wound base Yellow/Fibrinous Exudate 5%    Peri-wound Assessment Edema    Wound Length (cm) 2.5 cm    Wound Width (cm) 3 cm    Wound Depth (cm) 0 cm    Wound Volume (cm^3) 0 cm^3    Wound Surface Area (cm^2) 7.5 cm^2    Margins Attached edges (approximated)    Drainage Amount Minimal    Drainage Description Serous    Treatment Cleansed;Debridement (Selective)    Wound Properties Date First Assessed: 11/16/20 Time First Assessed: 1050 Wound Type: Other (Comment) Location: Pretibial Location Orientation: Left Wound Description (Comments): multiple blisters on anterior, lateral, posterior and medial aspect of Lt LE Present on Admission: No   Wound Image View All Images View Images    Treatment Cleansed;Debridement (Selective)    Selective Debridement - Location Lt foot    Selective Debridement - Tools Used Forceps    Selective Debridement - Tissue Removed devitalized and macerated skin    Wound Therapy - Clinical Statement see below    Wound Therapy - Functional Problem List unable to fit in shoes, unable  to dress due to drainage of Rt LE    Factors Delaying/Impairing Wound Healing Altered sensation;Diabetes Mellitus;Infection - systemic/local;Polypharmacy    Wound Therapy - Frequency 3X / week  Wound Plan measure wounds once weekly and use appropriate dressings condusive to healing.    Dressing  B multilayer shortstretch with 1/2 " foam.  Lt w/alginate on dorsal aspect of foot and xeroform at other small openings,            [facility] Adult PT Treatment/Exercise - 12/16/20 0001      Manual Therapy   Manual Therapy Manual Lymphatic Drainage (MLD);Compression Bandaging;Other (comment)    Manual therapy comments completed seperate from all other aspects of treatment.    Manual Lymphatic Drainage (MLD) to LE only Rt ankle had extra time as it was very swollen    Compression Bandaging foam and multilayer short stretch bandaging    Other Manual Therapy measurements                    PT Short Term Goals - 12/03/20 1618      PT SHORT TERM GOAL #1   Title Patient will be independent in self management strategies in order to reduce the risk of wound reoccurance.    Status On-going             PT Long Term Goals - 12/03/20 1618      PT LONG TERM GOAL #1   Title Patient will be able to don/doff compression garments independently in order to manage LE edema.    Baseline 12/03/20:  Reports compression garments to arrive next week    Status On-going      PT LONG TERM GOAL #2   Title PT to understand and verbalize that she needs to wear her compression garment and pump on a daily basis at D/C or edema will return    Baseline 12/03/20:  Pt does not remove dressings between treatment, reports she will receive compression garment next week.    Status On-going      PT LONG TERM GOAL #3   Title Patient will be able to wear her shoes again for improved ambulation ability.    Status On-going      PT LONG TERM GOAL #4   Title PT to have lost 6-8 cm in LE; 4 cm in foot to reduce risk of  cellulitis    Status Achieved                 Plan - 12/16/20 1452    Clinical Impression Statement Wounds continue to heal on Lt dorsal foot and scattered small areas around Lt LE.  Bil heels remain red and peely but skin is not broken and there is no drainage.  LE's measured for volume with overall improvement inferior knee to ankle but bil feet are both actually increased in volume this session.  Encouraged patient to try to return to bed for sleeping as she is staying in a recliner or on her couch.  Continued with silver hydrofiber on dorasl foot and moisturized LE"s well prior to rebandaging.    Personal Factors and Comorbidities Fitness;Behavior Pattern;Past/Current Experience;Comorbidity 3+;Time since onset of injury/illness/exacerbation;Transportation    Comorbidities lymphedema, HTN, obesity    Examination-Activity Limitations Bathing;Locomotion Level;Transfers;Stairs;Stand;Dressing;Hygiene/Grooming    Examination-Participation Restrictions Meal Prep;Community Activity;Volunteer;Yard Work;Shop;Laundry;Cleaning    Stability/Clinical Decision Making Evolving/Moderate complexity    Rehab Potential Good    PT Frequency 3x / week    PT Duration 6 weeks    PT Treatment/Interventions ADLs/Self Care Home Management;Manual techniques;Manual lymph drainage;Compression bandaging;Gait training;Stair training;Functional mobility training;Therapeutic activities;Therapeutic exercise;Balance training;Neuromuscular re-education;Vasopneumatic Device;Taping;Splinting;Energy conservation;Passive range of motion;Scar mobilization;Patient/family education;DME Instruction  PT Next Visit Plan Continue with  lymphedema management, wound care as needed.  Measure weekly.    PT Home Exercise Plan ankle pumps, elevation; 4/7:  see above           Patient will benefit from skilled therapeutic intervention in order to improve the following deficits and impairments:  Abnormal gait,Cardiopulmonary status  limiting activity,Decreased endurance,Decreased activity tolerance,Decreased skin integrity,Pain,Increased edema,Decreased mobility  Visit Diagnosis: Lymphedema, not elsewhere classified  Bilateral lower extremity edema  Difficulty in walking, not elsewhere classified  Generalized edema     Problem List Patient Active Problem List   Diagnosis Date Noted  . Abscess of right lower extremity excluding foot 10/18/2020  . Abscess of right leg 10/18/2020  . Dehydration 10/03/2020  . Leukocytosis 10/03/2020  . Thrombocytosis 10/03/2020  . Hyponatremia 10/03/2020  . Hypochloremia 10/03/2020  . Failure to thrive in adult 10/03/2020  . Acute renal failure superimposed on stage 3b chronic kidney disease ([place]) 10/03/2020  . Uncontrolled type 2 diabetes mellitus with hyperglycemia, without long-term current use of insulin ([place]) 10/03/2020  . Acute kidney injury superimposed on CKD ([place]) 10/02/2020  . Chronic kidney disease 10/01/2020  . 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[place]) 05/12/2020  . Chronic insomnia 01/08/2020  . Ocular myasthenia gravis ([place]) 09/18/2019  . Peripheral edema 01/23/2019  . Asthma 05/14/2015  . Heel spur 02/24/2015  . Depression 02/24/2015  . Post-menopausal bleeding 06/02/2014  . OA (osteoarthritis) of knee 04/09/2014  . Epidermal cyst 10/01/2013  . Class 3 obesity 02/25/2013  . Back pain 03/04/2012  . Gout 01/25/2012  . Edema 01/28/2011  . Obesity hypoventilation syndrome ([place]) 06/16/2008  . OBSTRUCTIVE SLEEP APNEA 05/08/2008  . Hyperlipidemia 08/28/2006  . Anxiety 08/28/2006  . Essential hypertension 08/28/2006  . GERD 08/28/2006  . Osteoarthritis 08/28/2006   [name], PTA/CLT [phone]  [name] 12/16/2020, 3:05 PM  [facility] [place], [place], [address] Phone: [phone]   Fax:  [phone]  Name: [name] MRN: [id number] Date of Birth: 01-10-1951

## 2020-12-17 ENCOUNTER — Ambulatory Visit (HOSPITAL_COMMUNITY): Payer: Medicare Other | Admitting: Physical Therapy

## 2020-12-17 DIAGNOSIS — R262 Difficulty in walking, not elsewhere classified: Secondary | ICD-10-CM

## 2020-12-17 DIAGNOSIS — R601 Generalized edema: Secondary | ICD-10-CM | POA: Diagnosis not present

## 2020-12-17 DIAGNOSIS — R600 Localized edema: Secondary | ICD-10-CM

## 2020-12-17 DIAGNOSIS — I890 Lymphedema, not elsewhere classified: Secondary | ICD-10-CM

## 2020-12-17 NOTE — Therapy (Signed)
[facility] [place], [place], [address] Phone: [phone]   Fax:  [phone]  Physical Therapy Treatment  Patient Details  Name: [name] MRN: [id number] Date of Birth: [birth year] No data recorded  Encounter Date: 12/17/2020   PT End of Session - 12/17/20 1440    Visit Number 22    Number of Visits 27    Date for PT Re-Evaluation 12/24/20    Authorization Type UHC Medicare (no visit limit, no auth)    Progress Note Due on Visit 27    PT Start Time 1315    PT Stop Time 1430    PT Time Calculation (min) 75 min           Past Medical History:  Diagnosis Date  . Achilles tendinitis   . Anxiety   . Asthma   . Depression   . GERD (gastroesophageal reflux disease)   . Gout   . H/O myasthenia gravis    left eye  . HTN (hypertension)   . Hyperlipidemia   . Low back pain   . Obesity hypoventilation syndrome ([place])   . Obstructive sleep apnea   . Osteoarthritis     Past Surgical History:  Procedure Laterality Date  . CATARACT EXTRACTION W/PHACO Left 09/17/2015   Procedure: CATARACT EXTRACTION PHACO AND INTRAOCULAR LENS PLACEMENT LEFT EYE cde=8.58;  Surgeon: Tonny Branch, MD;  Location: [facility];  Service: Ophthalmology;  Laterality: Left;  . CATARACT EXTRACTION W/PHACO Right 05/15/2017   Procedure: CATARACT EXTRACTION PHACO AND INTRAOCULAR LENS PLACEMENT (IOC);  Surgeon: Tonny Branch, MD;  Location: [facility];  Service: Ophthalmology;  Laterality: Right;  CDE: 6.34  . COLONOSCOPY N/A 12/25/2013   Procedure: COLONOSCOPY;  Surgeon: Rogene Houston, MD;  [facility]: [facility];  Service: Endoscopy;  Laterality: N/A;  730-rescheduled Ann notified pt  . COLONOSCOPY WITH PROPOFOL N/A 10/04/2019   Procedure: COLONOSCOPY WITH PROPOFOL;  Surgeon: Rogene Houston, MD;  [facility];  Service: Endoscopy;  Laterality: N/A;  815  . CYST EXCISION N/A 09/12/2016   Procedure: EXCISION SEBACEOUS CYST, BACK;  Surgeon: Aviva Signs, MD;  Location: [facility];  Service: General;  Laterality: N/A;  . INTRAOCULAR LENS INSERTION Bilateral 2018   Dr. Tonny Branch   . POLYPECTOMY  10/04/2019   Procedure: POLYPECTOMY;  Surgeon: Rogene Houston, MD;  [facility]: [facility];  Service: Endoscopy;;  . TUBAL LIGATION      There were no vitals filed for this visit.   Subjective Assessment - 12/17/20 1434    Subjective Pt states that she is going to her MD next Friday.  She is concerned about the soreness of her heels.  Pt had stated in the past that she had a w/c and would move via her feet but  now states that she does not even own a wheelchair .  Both heels are very red with Lt greater than Rt but no open wound. Therapist urged pt to keep pressure off of her heels    Pertinent History chronic edema in LE with wounds causing hospitalization, ( most likely lymphedema), gout, LBP, DM,    Limitations House hold activities;Walking                             [facility] Adult PT Treatment/Exercise - 12/17/20 0001      Manual Therapy   Manual Therapy Manual Lymphatic Drainage (MLD);Compression Bandaging;Other (comment)  Manual therapy comments completed seperate from all other aspects of treatment.    Manual Lymphatic Drainage (MLD) to include supraclavicular, deep and superficail abdominal, inguingal axillary anastomosis and B anterior LE    Compression Bandaging B profore as pt does not have an appointment for a week                  PT Education - 12/17/20 1439    Education Details reviewed self manual for thighs again as they have significant induration but pt refuses to have compression bandaging to the thigh level.  Explained to take bandaging off if she has any increase pain.    Person(s) Educated Patient    Methods Explanation    Comprehension Verbalized understanding            PT Short Term Goals - 12/03/20 1618      PT SHORT TERM GOAL #1   Title Patient will be independent in self  management strategies in order to reduce the risk of wound reoccurance.    Status On-going             PT Long Term Goals - 12/03/20 1618      PT LONG TERM GOAL #1   Title Patient will be able to don/doff compression garments independently in order to manage LE edema.    Baseline 12/03/20:  Reports compression garments to arrive next week    Status On-going      PT LONG TERM GOAL #2   Title PT to understand and verbalize that she needs to wear her compression garment and pump on a daily basis at D/C or edema will return    Baseline 12/03/20:  Pt does not remove dressings between treatment, reports she will receive compression garment next week.    Status On-going      PT LONG TERM GOAL #3   Title Patient will be able to wear her shoes again for improved ambulation ability.    Status On-going      PT LONG TERM GOAL #4   Title PT to have lost 6-8 cm in LE; 4 cm in foot to reduce risk of cellulitis    Status Achieved                 Plan - 12/17/20 1440    Clinical Impression Statement Only open area on Lt LE is a small area on anterior aspect and a linear area on medial aspect where foam did not meet.  No significant drainage from either of these areas.  Pt compression is profore today as due to Holiday pt will not be able to return for a week.    Personal Factors and Comorbidities Fitness;Behavior Pattern;Past/Current Experience;Comorbidity 3+;Time since onset of injury/illness/exacerbation;Transportation    Comorbidities lymphedema, HTN, obesity    Examination-Activity Limitations Bathing;Locomotion Level;Transfers;Stairs;Stand;Dressing;Hygiene/Grooming    Examination-Participation Restrictions Meal Prep;Community Activity;Volunteer;Yard Work;Shop;Laundry;Cleaning    Stability/Clinical Decision Making Evolving/Moderate complexity    Clinical Decision Making Moderate    Rehab Potential Good    PT Frequency 3x / week    PT Duration 6 weeks    PT Treatment/Interventions  ADLs/Self Care Home Management;Manual techniques;Manual lymph drainage;Compression bandaging;Gait training;Stair training;Functional mobility training;Therapeutic activities;Therapeutic exercise;Balance training;Neuromuscular re-education;Vasopneumatic Device;Taping;Splinting;Energy conservation;Passive range of motion;Scar mobilization;Patient/family education;DME Instruction           Patient will benefit from skilled therapeutic intervention in order to improve the following deficits and impairments:  Abnormal gait,Cardiopulmonary status limiting activity,Decreased endurance,Decreased activity tolerance,Decreased skin integrity,Pain,Increased edema,Decreased mobility  Visit Diagnosis: Lymphedema, not elsewhere classified  Bilateral lower extremity edema  Difficulty in walking, not elsewhere classified     Problem List Patient Active Problem List   Diagnosis Date Noted  . Abscess of right lower extremity excluding foot 10/18/2020  . Abscess of right leg 10/18/2020  . Dehydration 10/03/2020  . Leukocytosis 10/03/2020  . Thrombocytosis 10/03/2020  . Hyponatremia 10/03/2020  . Hypochloremia 10/03/2020  . Failure to thrive in adult 10/03/2020  . Acute renal failure superimposed on stage 3b chronic kidney disease ([place]) 10/03/2020  . Uncontrolled type 2 diabetes mellitus with hyperglycemia, without long-term current use of insulin ([place]) 10/03/2020  . Acute kidney injury superimposed on CKD ([place]) 10/02/2020  . Chronic kidney disease 10/01/2020  . 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[place]) 05/12/2020  . Chronic insomnia 01/08/2020  . Ocular myasthenia gravis ([place]) 09/18/2019  . Peripheral edema 01/23/2019  . Asthma 05/14/2015  . Heel spur 02/24/2015  . Depression 02/24/2015  . Post-menopausal bleeding 06/02/2014  . OA (osteoarthritis) of knee 04/09/2014  . Epidermal cyst 10/01/2013  . Class 3 obesity 02/25/2013  . Back pain 03/04/2012  . Gout 01/25/2012  . Edema 01/28/2011  .  Obesity hypoventilation syndrome ([place]) 06/16/2008  . OBSTRUCTIVE SLEEP APNEA 05/08/2008  . Hyperlipidemia 08/28/2006  . Anxiety 08/28/2006  . Essential hypertension 08/28/2006  . GERD 08/28/2006  . Osteoarthritis 08/28/2006   Rayetta Humphrey, PT CLT [phone] 12/17/2020, 2:43 PM  [facility] [address] [place], [place], [address] Phone: [phone]   Fax:  [phone]  Name: [name] MRN: [id number] Date of Birth: 1951-03-25

## 2020-12-24 ENCOUNTER — Ambulatory Visit (HOSPITAL_COMMUNITY): Payer: Medicare Other | Attending: Internal Medicine | Admitting: Physical Therapy

## 2020-12-24 ENCOUNTER — Other Ambulatory Visit: Payer: Self-pay

## 2020-12-24 DIAGNOSIS — R601 Generalized edema: Secondary | ICD-10-CM | POA: Diagnosis present

## 2020-12-24 DIAGNOSIS — L02415 Cutaneous abscess of right lower limb: Secondary | ICD-10-CM | POA: Insufficient documentation

## 2020-12-24 DIAGNOSIS — I890 Lymphedema, not elsewhere classified: Secondary | ICD-10-CM | POA: Diagnosis not present

## 2020-12-24 DIAGNOSIS — R600 Localized edema: Secondary | ICD-10-CM | POA: Insufficient documentation

## 2020-12-24 DIAGNOSIS — R262 Difficulty in walking, not elsewhere classified: Secondary | ICD-10-CM | POA: Diagnosis present

## 2020-12-24 NOTE — Therapy (Addendum)
[facility] [address] [place], [place], [address] Phone: [phone]   Fax:  [phone]  Physical Therapy Treatment Progress Note Reporting Period 12/07/2020 to 12/24/2020  See note below for Objective Data and Assessment of Progress/Goals.      Patient Details  Name: [name] MRN: [id number] Date of Birth: [birth year] No data recorded  Encounter Date: 12/24/2020   PT End of Session - 12/24/20 1609    Visit Number 23    Number of Visits 27    Date for PT Re-Evaluation 02/04/2021   Authorization Type UHC Medicare (no visit limit, no auth)    Progress Note Due on Visit 27    PT Start Time 1320    PT Stop Time 1438    PT Time Calculation (min) 78 min           Past Medical History:  Diagnosis Date  . Achilles tendinitis   . Anxiety   . Asthma   . Depression   . GERD (gastroesophageal reflux disease)   . Gout   . H/O myasthenia gravis    left eye  . HTN (hypertension)   . Hyperlipidemia   . Low back pain   . Obesity hypoventilation syndrome ([place])   . Obstructive sleep apnea   . Osteoarthritis     Past Surgical History:  Procedure Laterality Date  . CATARACT EXTRACTION W/PHACO Left 09/17/2015   Procedure: CATARACT EXTRACTION PHACO AND INTRAOCULAR LENS PLACEMENT LEFT EYE cde=8.58;  Surgeon: Tonny Branch, MD;  Location: [facility];  Service: Ophthalmology;  Laterality: Left;  . CATARACT EXTRACTION W/PHACO Right 05/15/2017   Procedure: CATARACT EXTRACTION PHACO AND INTRAOCULAR LENS PLACEMENT (IOC);  Surgeon: Tonny Branch, MD;  Location: [facility];  Service: Ophthalmology;  Laterality: Right;  CDE: 6.34  . COLONOSCOPY N/A 12/25/2013   Procedure: COLONOSCOPY;  Surgeon: Rogene Houston, MD;  [facility];  Service: Endoscopy;  Laterality: N/A;  730-rescheduled Ann notified pt  . COLONOSCOPY WITH PROPOFOL N/A 10/04/2019   Procedure: COLONOSCOPY WITH PROPOFOL;  Surgeon: Rogene Houston, MD;  [facility];   Service: Endoscopy;  Laterality: N/A;  815  . CYST EXCISION N/A 09/12/2016   Procedure: EXCISION SEBACEOUS CYST, BACK;  Surgeon: Aviva Signs, MD;  Location: [facility];  Service: General;  Laterality: N/A;  . INTRAOCULAR LENS INSERTION Bilateral 2018   Dr. Tonny Branch   . POLYPECTOMY  10/04/2019   Procedure: POLYPECTOMY;  Surgeon: Rogene Houston, MD;  [facility];  Service: Endoscopy;;  . TUBAL LIGATION      There were no vitals filed for this visit.          LYMPHEDEMA/ONCOLOGY QUESTIONNAIRE - 12/24/20 0001      Right Lower Extremity Lymphedema   30 cm Proximal to Floor at Lateral Plantar Foot 43.3 cm   was 48.5 on 4/1   20 cm Proximal to Floor at Lateral Plantar Foot 34 1   was 40.5 on 4/1   10 cm Proximal to Floor at Lateral Malleoli 28.2 cm   was 30 on 4/1   Circumference of ankle/heel 24 cm.   was 36.7 on 4/7   5 cm Proximal to 1st MTP Joint 26 cm   was 26.8 on 4/7   Across MTP Joint 25.8 cm   was 25.6 on 4/14     Left Lower Extremity Lymphedema   30 cm Proximal to Floor at Lateral Plantar Foot 45.5 cm   was 50.5  on 4/1   20 cm Proximal to Floor at Lateral Plantar Foot 33 cm   was 45 on 4/1   10 cm Proximal to Floor at Lateral Malleoli 35 cm   was 33  was initially 39   Circumference of ankle/heel 39.7 cm.   was 38 on 4/7   5 cm Proximal to 1st MTP Joint 27.5 cm   was 29.1 on 4/7   Across MTP Joint 26.5 cm   was 29 on 4/7                               PT Short Term Goals - 12/03/20 1618      PT SHORT TERM GOAL #1   Title Patient will be independent in self management strategies in order to reduce the risk of wound reoccurance.    Status On-going             PT Long Term Goals - 12/03/20 1618      PT LONG TERM GOAL #1   Title Patient will be able to don/doff compression garments independently in order to manage LE edema.    Baseline 12/03/20:  Reports compression garments to arrive next week    Status On-going      PT LONG TERM  GOAL #2   Title PT to understand and verbalize that she needs to wear her compression garment and pump on a daily basis at D/C or edema will return    Baseline 12/03/20:  Pt does not remove dressings between treatment, reports she will receive compression garment next week.    Status On-going      PT LONG TERM GOAL #3   Title Patient will be able to wear her shoes again for improved ambulation ability.    Status On-going      PT LONG TERM GOAL #4   Title PT to have lost 6-8 cm in LE; 4 cm in foot to reduce risk of cellulitis    Status Achieved                 Plan - 12/24/20 1601    Clinical Impression Statement Pt returns today with profores still intact, however slid down due to decompression of LE.  Resultant bottle neck at distal ends bilaterally with return of blistering/drainage on Lt LE, Rt LE intact.  Bil LE's also photographed (in media) and measured this session with slight increase as compared to last week, however still greatly improved from volume at initial evaluation.  Completed manual lymph drainage, cleansed and moisturized LE's well.  Continued with profore on Lt due to drainage with addition of foam to maintain bandaging when decompresses.  Returned to short stretch on Rt LE.  Educated during massage regarding when to remove bandaging (when coming down or uncomfortable) and to call if she is unsure and we can problem solve.  Pt will benefit from further lymphedema treatment until receives compression garments.  Order also faxed to MD today for night time garments and will need to be faxed to [facility] when receives.    Personal Factors and Comorbidities Fitness;Behavior Pattern;Past/Current Experience;Comorbidity 3+;Time since onset of injury/illness/exacerbation;Transportation    Comorbidities lymphedema, HTN, obesity    Examination-Activity Limitations Bathing;Locomotion Level;Transfers;Stairs;Stand;Dressing;Hygiene/Grooming    Examination-Participation Restrictions Meal  Prep;Community Activity;Volunteer;Yard Work;Shop;Laundry;Cleaning    Stability/Clinical Decision Making Evolving/Moderate complexity    Rehab Potential Good    PT Frequency 3x / week    PT Duration  6 weeks    PT Treatment/Interventions ADLs/Self Care Home Management;Manual techniques;Manual lymph drainage;Compression bandaging;Gait training;Stair training;Functional mobility training;Therapeutic activities;Therapeutic exercise;Balance training;Neuromuscular re-education;Vasopneumatic Device;Taping;Splinting;Energy conservation;Passive range of motion;Scar mobilization;Patient/family education;DME Instruction    PT Next Visit Plan Continue with  lymphedema management, wound care as needed.  Measure weekly.           Patient will benefit from skilled therapeutic intervention in order to improve the following deficits and impairments:  Abnormal gait,Cardiopulmonary status limiting activity,Decreased endurance,Decreased activity tolerance,Decreased skin integrity,Pain,Increased edema,Decreased mobility  Visit Diagnosis: Lymphedema, not elsewhere classified  Bilateral lower extremity edema  Difficulty in walking, not elsewhere classified  Abscess of right leg  Generalized edema  Abscess of right lower extremity excluding foot     Problem List Patient Active Problem List   Diagnosis Date Noted  . Abscess of right lower extremity excluding foot 10/18/2020  . Abscess of right leg 10/18/2020  . Dehydration 10/03/2020  . Leukocytosis 10/03/2020  . Thrombocytosis 10/03/2020  . Hyponatremia 10/03/2020  . Hypochloremia 10/03/2020  . Failure to thrive in adult 10/03/2020  . Acute renal failure superimposed on stage 3b chronic kidney disease ([place]) 10/03/2020  . Uncontrolled type 2 diabetes mellitus with hyperglycemia, without long-term current use of insulin ([place]) 10/03/2020  . Acute kidney injury superimposed on CKD ([place]) 10/02/2020  . Chronic kidney disease 10/01/2020  .  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[place]) 05/12/2020  . Chronic insomnia 01/08/2020  . Ocular myasthenia gravis ([place]) 09/18/2019  . Peripheral edema 01/23/2019  . Asthma 05/14/2015  . Heel spur 02/24/2015  . Depression 02/24/2015  . Post-menopausal bleeding 06/02/2014  . OA (osteoarthritis) of knee 04/09/2014  . Epidermal cyst 10/01/2013  . Class 3 obesity 02/25/2013  . Back pain 03/04/2012  . Gout 01/25/2012  . Edema 01/28/2011  . Obesity hypoventilation syndrome ([place]) 06/16/2008  . OBSTRUCTIVE SLEEP APNEA 05/08/2008  . Hyperlipidemia 08/28/2006  . Anxiety 08/28/2006  . Essential hypertension 08/28/2006  . GERD 08/28/2006  . Osteoarthritis 08/28/2006   Teena Irani, PTA/CLT [phone]  Teena Irani 12/24/2020, 4:11 PM  [facility] [address] [place], [place], [address] Phone: [phone]   Fax:  [phone]  Name: [name] MRN: [id number] Date of Birth: 1950-10-31

## 2020-12-25 NOTE — Addendum Note (Signed)
Addended by: Leeroy Cha on: 12/25/2020 08:32 AM   Modules accepted: Orders

## 2020-12-29 ENCOUNTER — Ambulatory Visit (HOSPITAL_COMMUNITY): Payer: Medicare Other | Admitting: Physical Therapy

## 2020-12-29 ENCOUNTER — Other Ambulatory Visit: Payer: Self-pay

## 2020-12-29 DIAGNOSIS — R262 Difficulty in walking, not elsewhere classified: Secondary | ICD-10-CM

## 2020-12-29 DIAGNOSIS — I890 Lymphedema, not elsewhere classified: Secondary | ICD-10-CM

## 2020-12-29 DIAGNOSIS — R600 Localized edema: Secondary | ICD-10-CM

## 2020-12-29 NOTE — Therapy (Signed)
[facility] [place], [place], [address] Phone: [phone]   Fax:  [phone]  Physical Therapy Treatment  Patient Details  Name: [name] MRN: [id number] Date of Birth: [birth year] No data recorded  Encounter Date: 12/29/2020   PT End of Session - 12/29/20 1631    Visit Number 24    Number of Visits 33    Date for PT Re-Evaluation 02/04/21    Authorization Type UHC Medicare (no visit limit, no auth)    Progress Note Due on Visit 92    PT Start Time 1404    PT Stop Time 1515    PT Time Calculation (min) 71 min           Past Medical History:  Diagnosis Date  . Achilles tendinitis   . Anxiety   . Asthma   . Depression   . GERD (gastroesophageal reflux disease)   . Gout   . H/O myasthenia gravis    left eye  . HTN (hypertension)   . Hyperlipidemia   . Low back pain   . Obesity hypoventilation syndrome ([place])   . Obstructive sleep apnea   . Osteoarthritis     Past Surgical History:  Procedure Laterality Date  . CATARACT EXTRACTION W/PHACO Left 09/17/2015   Procedure: CATARACT EXTRACTION PHACO AND INTRAOCULAR LENS PLACEMENT LEFT EYE cde=8.58;  Surgeon: Tonny Branch, MD;  Location: [facility];  Service: Ophthalmology;  Laterality: Left;  . CATARACT EXTRACTION W/PHACO Right 05/15/2017   Procedure: CATARACT EXTRACTION PHACO AND INTRAOCULAR LENS PLACEMENT (IOC);  Surgeon: Tonny Branch, MD;  Location: [facility];  Service: Ophthalmology;  Laterality: Right;  CDE: 6.34  . COLONOSCOPY N/A 12/25/2013   Procedure: COLONOSCOPY;  Surgeon: Rogene Houston, MD;  [facility];  Service: Endoscopy;  Laterality: N/A;  730-rescheduled Ann notified pt  . COLONOSCOPY WITH PROPOFOL N/A 10/04/2019   Procedure: COLONOSCOPY WITH PROPOFOL;  Surgeon: Rogene Houston, MD;  [facility];  Service: Endoscopy;  Laterality: N/A;  815  . CYST EXCISION N/A 09/12/2016   Procedure: EXCISION SEBACEOUS CYST, BACK;  Surgeon: Aviva Signs, MD;  Location: [facility];  Service: General;  Laterality: N/A;  . INTRAOCULAR LENS INSERTION Bilateral 2018   Dr. Tonny Branch   . POLYPECTOMY  10/04/2019   Procedure: POLYPECTOMY;  Surgeon: Rogene Houston, MD;  Location: [facility];  Service: Endoscopy;;  . TUBAL LIGATION      There were no vitals filed for this visit.                    Wound Therapy - 12/29/20 1624    Subjective no pain unless direct pressure on heels    Patient and Family Stated Goals wound to heal    Date of Onset 07/11/21    Prior Treatments unna boots, hospitalizations    Pain Scale 0-10    Pain Score 0-No pain    Evaluation and Treatment Procedures Explained to Patient/Family Yes    Evaluation and Treatment Procedures agreed to    Wound Properties Date First Assessed: 12/16/20 Time First Assessed: 0130 Location: Foot Location Orientation: Anterior;Left   Dressing Type Silver hydrofiber    Dressing Changed Changed    Dressing Status Intact    Dressing Change Frequency PRN    Site / Wound Assessment Pale;Pink    % Wound base Red or Granulating 95%    % Wound base Yellow/Fibrinous Exudate 5%    [name]-wound  Assessment Edema    Margins Attached edges (approximated)    Drainage Amount Minimal    Drainage Description Serous    Treatment Cleansed;Debridement (Selective)    Wound Properties Date First Assessed: 11/16/20 Time First Assessed: 1050 Wound Type: Other (Comment) Location: Pretibial Location Orientation: Left Wound Description (Comments): multiple blisters on anterior, lateral, posterior and medial aspect of Lt LE Present on Admission: No   Dressing Changed Changed    Dressing Change Frequency PRN    Site / Wound Assessment Pink;Red;Yellow    % Wound base Red or Granulating 50%    [name]-wound Assessment Edema    Drainage Amount Minimal    Drainage Description Purulent;No odor    Treatment Cleansed;Debridement (Selective)    Selective Debridement - Location Lt foot, Lt LE     Selective Debridement - Tools Used Forceps    Selective Debridement - Tissue Removed devitalized and macerated skin    Wound Therapy - Clinical Statement see below    Wound Therapy - Functional Problem List unable to fit in shoes, unable to dress due to drainage of Rt LE    Factors Delaying/Impairing Wound Healing Altered sensation;Diabetes Mellitus;Infection - systemic/local;Polypharmacy    Wound Therapy - Frequency 3X / week    Wound Plan measure wounds once weekly and use appropriate dressings condusive to healing.    Dressing  B multilayer shortstretch with 1/2 " foam.  Lt w/alginate on dorsal aspect of foot and xeroform at other small openings,            [facility] Adult PT Treatment/Exercise - 12/29/20 0001      Manual Therapy   Manual Therapy Manual Lymphatic Drainage (MLD);Compression Bandaging;Other (comment)    Manual therapy comments completed seperate from all other aspects of treatment.    Manual Lymphatic Drainage (MLD) to include supraclavicular, deep and superficial abdominal, inguinal axillary anastomosis and B anterior LE    Compression Bandaging Lt profore and Rt short stretch both with 1/2" foam                  PT Education - 12/29/20 1630    Education Details educated on purpose of washing garments and being compliant with care for optimal results.    Person(s) Educated Patient    Methods Explanation    Comprehension Need further instruction;Other (comment)            PT Short Term Goals - 12/03/20 1618      PT SHORT TERM GOAL #1   Title Patient will be independent in self management strategies in order to reduce the risk of wound reoccurance.    Status On-going             PT Long Term Goals - 12/03/20 1618      PT LONG TERM GOAL #1   Title Patient will be able to don/doff compression garments independently in order to manage LE edema.    Baseline 12/03/20:  Reports compression garments to arrive next week    Status On-going      PT LONG  TERM GOAL #2   Title PT to understand and verbalize that she needs to wear her compression garment and pump on a daily basis at D/C or edema will return    Baseline 12/03/20:  Pt does not remove dressings between treatment, reports she will receive compression garment next week.    Status On-going      PT LONG TERM GOAL #3   Title Patient will be able to wear  her shoes again for improved ambulation ability.    Status On-going      PT LONG TERM GOAL #4   Title PT to have lost 6-8 cm in LE; 4 cm in foot to reduce risk of cellulitis    Status Achieved                 Plan - 12/29/20 1633    Clinical Impression Statement Profores and shorts stretch both intact this session without sliding down, however noted increased drainage present.  Pt with pseudomonas drainage and multiple blistered areas with pus drainage in Lt LE.  Pt encouraged to contact MD regarding need for antibiotics. Pt reported she would call Dr Karie Kirks as last time she went to ED they would not give her any.  Continued with lymph therapy and in spite of continued drainage in Lt LE used profore again this session.  Lt LE also had increased pseudomonas drainage but no pus areas.  Used silver hydrofiber on all wounds today due to increased bacterial count. Extra ABD padding as well.  Continunued with short stretch on Rt LE.  Pt Asked to take soilded short stretch home, however she adamantly refused.  discussed with manager and will discuss with pateint again next visit.  If patient refuses to assist in care we may need to refer her to a different facility.    Personal Factors and Comorbidities Fitness;Behavior Pattern;Past/Current Experience;Comorbidity 3+;Time since onset of injury/illness/exacerbation;Transportation    Comorbidities lymphedema, HTN, obesity    Examination-Activity Limitations Bathing;Locomotion Level;Transfers;Stairs;Stand;Dressing;Hygiene/Grooming    Examination-Participation Restrictions Meal Prep;Community  Activity;Volunteer;Yard Work;Shop;Laundry;Cleaning    Stability/Clinical Decision Making Evolving/Moderate complexity    Rehab Potential Good    PT Frequency 3x / week    PT Duration 6 weeks    PT Treatment/Interventions ADLs/Self Care Home Management;Manual techniques;Manual lymph drainage;Compression bandaging;Gait training;Stair training;Functional mobility training;Therapeutic activities;Therapeutic exercise;Balance training;Neuromuscular re-education;Vasopneumatic Device;Taping;Splinting;Energy conservation;Passive range of motion;Scar mobilization;Patient/family education;DME Instruction    PT Next Visit Plan Continue with  lymphedema management, wound care as needed.  Measure weekly.  Address soilded bandages again next session.           Patient will benefit from skilled therapeutic intervention in order to improve the following deficits and impairments:  Abnormal gait,Cardiopulmonary status limiting activity,Decreased endurance,Decreased activity tolerance,Decreased skin integrity,Pain,Increased edema,Decreased mobility  Visit Diagnosis: Bilateral lower extremity edema  Lymphedema, not elsewhere classified  Difficulty in walking, not elsewhere classified     Problem List Patient Active Problem List   Diagnosis Date Noted  . Abscess of right lower extremity excluding foot 10/18/2020  . Abscess of right leg 10/18/2020  . Dehydration 10/03/2020  . Leukocytosis 10/03/2020  . Thrombocytosis 10/03/2020  . Hyponatremia 10/03/2020  . Hypochloremia 10/03/2020  . Failure to thrive in adult 10/03/2020  . Acute renal failure superimposed on stage 3b chronic kidney disease ([place]) 10/03/2020  . Uncontrolled type 2 diabetes mellitus with hyperglycemia, without long-term current use of insulin ([place]) 10/03/2020  . Acute kidney injury superimposed on CKD ([place]) 10/02/2020  . Chronic kidney disease 10/01/2020  . 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[place]) 05/12/2020  . Chronic insomnia  01/08/2020  . Ocular myasthenia gravis ([place]) 09/18/2019  . Peripheral edema 01/23/2019  . Asthma 05/14/2015  . Heel spur 02/24/2015  . Depression 02/24/2015  . Post-menopausal bleeding 06/02/2014  . OA (osteoarthritis) of knee 04/09/2014  . Epidermal cyst 10/01/2013  . Class 3 obesity 02/25/2013  . Back pain 03/04/2012  . Gout 01/25/2012  . Edema 01/28/2011  . Obesity hypoventilation  syndrome ([place]) 06/16/2008  . OBSTRUCTIVE SLEEP APNEA 05/08/2008  . Hyperlipidemia 08/28/2006  . Anxiety 08/28/2006  . Essential hypertension 08/28/2006  . GERD 08/28/2006  . Osteoarthritis 08/28/2006   Teena Irani, PTA/CLT [phone]  Teena Irani 12/29/2020, 4:40 PM  [facility] [address] [place], [place], [address] Phone: [phone]   Fax:  [phone]  Name: [name] MRN: [id number] Date of Birth: [birth year]

## 2020-12-30 ENCOUNTER — Telehealth (HOSPITAL_COMMUNITY): Payer: Self-pay | Admitting: Physical Therapy

## 2020-12-30 ENCOUNTER — Ambulatory Visit (HOSPITAL_COMMUNITY): Payer: Medicare Other

## 2020-12-30 ENCOUNTER — Encounter (HOSPITAL_COMMUNITY): Payer: Self-pay

## 2020-12-30 DIAGNOSIS — L02415 Cutaneous abscess of right lower limb: Secondary | ICD-10-CM

## 2020-12-30 DIAGNOSIS — R600 Localized edema: Secondary | ICD-10-CM

## 2020-12-30 DIAGNOSIS — I890 Lymphedema, not elsewhere classified: Secondary | ICD-10-CM | POA: Diagnosis not present

## 2020-12-30 DIAGNOSIS — R262 Difficulty in walking, not elsewhere classified: Secondary | ICD-10-CM

## 2020-12-30 DIAGNOSIS — R601 Generalized edema: Secondary | ICD-10-CM

## 2020-12-30 NOTE — Telephone Encounter (Signed)
[employer] REP Yevonne Pax ([employer]) called  to ask why [name] was d/c. Jenny Reichmann reported patient has been non-compliant in washing/rolling garments that were needed for her care. Glenard Haring PN#[id number].

## 2020-12-30 NOTE — Therapy (Addendum)
[facility] [place], [place], [address] Phone: [phone]   Fax:  [phone]  Wound Care Therapy  Patient Details  Name: [name] MRN: [id number] Date of Birth: [birth year] No data recorded  Encounter Date: 12/30/2020 PHYSICAL THERAPY DISCHARGE SUMMARY  Visits from Start of Care: 25  Current functional level related to goals / functional outcomes: PT volumes have decreased but continues to have open wounds.  Pt has not made any progress in obtaining juxtafit.  She refuses to wash or roll her short stretch bandages.    Remaining deficits: Swelling and wounds    Education / Equipment: That due to pt non compliance we will not be able to continue to see pt.  Pt must have compression bandaging and her  Bandages are soiled they must be washed but pt refuses to ask a family member or wash the garments themselves.   These Bandages are provided via a grant, one pair per patient.  She could buy another set but refuses to do this as well.    Plan: Patient does not agree to discharge but refuses to wash bandages .  Patient goals were not met. Patient is being discharged due to non-compliance. .         PT End of Session - 12/30/20 1026     Visit Number 25    Number of Visits 25   Date for PT Re-Evaluation 02/04/21    Authorization Type UHC Medicare (no visit limit, no auth)    Progress Note Due on Visit 25   PT Start Time 0917    PT Stop Time 1007    PT Time Calculation (min) 50 min    Activity Tolerance Patient tolerated treatment well    Behavior During Therapy WFL for tasks assessed/performed             Past Medical History:  Diagnosis Date   Achilles tendinitis    Anxiety    Asthma    Depression    GERD (gastroesophageal reflux disease)    Gout    H/O myasthenia gravis    left eye   HTN (hypertension)    Hyperlipidemia    Low back pain    Obesity hypoventilation syndrome ([facility])    Obstructive  sleep apnea    Osteoarthritis     Past Surgical History:  Procedure Laterality Date   CATARACT EXTRACTION W/PHACO Left 09/17/2015   Procedure: CATARACT EXTRACTION PHACO AND INTRAOCULAR LENS PLACEMENT LEFT EYE cde=8.58;  Surgeon: Tonny Branch, MD;  Location: [facility];  Service: Ophthalmology;  Laterality: Left;   CATARACT EXTRACTION W/PHACO Right 05/15/2017   Procedure: CATARACT EXTRACTION PHACO AND INTRAOCULAR LENS PLACEMENT (IOC);  Surgeon: Tonny Branch, MD;  Location: [facility];  Service: Ophthalmology;  Laterality: Right;  CDE: 6.34   COLONOSCOPY N/A 12/25/2013   Procedure: COLONOSCOPY;  Surgeon: Rogene Houston, MD;  [facility];  Service: Endoscopy;  Laterality: N/A;  730-rescheduled Ann notified pt   COLONOSCOPY WITH PROPOFOL N/A 10/04/2019   Procedure: COLONOSCOPY WITH PROPOFOL;  Surgeon: Rogene Houston, MD;  [facility];  Service: Endoscopy;  Laterality: N/A;  815   CYST EXCISION N/A 09/12/2016   Procedure: EXCISION SEBACEOUS CYST, BACK;  Surgeon: Aviva Signs, MD;  Location: [facility];  Service: General;  Laterality: N/A;   INTRAOCULAR LENS INSERTION Bilateral 2018   Dr. Tonny Branch    POLYPECTOMY  10/04/2019   Procedure: POLYPECTOMY;  Surgeon: Rogene Houston, MD;  [facility];  Service: Endoscopy;;   TUBAL LIGATION      There were no vitals filed for this visit.    Subjective Assessment - 12/30/20 1021     Subjective Pt stated she tried to call MD yesterday, no answer and no ability to leave a message.  Stated she has apt for blood work on Friday.  Current pain scale 10/10, doesn't feel need to go to ER though.    Pertinent History chronic edema in LE with wounds causing hospitalization, ( most likely lymphedema), gout, LBP, DM,    Currently in Pain? Yes    Pain Score 10-Worst pain ever    Pain Location Heel    Pain Orientation Right;Left    Pain Descriptors / Indicators Burning;Constant    Pain Type Chronic pain                        Wound Therapy - 12/30/20 1021     Subjective Pt stated she tried to call MD yesterday, no answer and no ability to leave a message.  Stated she has apt for blood work on Friday.  Current pain scale 10/10, doesn't feel need to go to ER though.    Patient and Family Stated Goals wound to heal    Date of Onset 07/11/21    Prior Treatments unna boots, hospitalizations    Pain Scale 0-10    Evaluation and Treatment Procedures Explained to Patient/Family Yes    Evaluation and Treatment Procedures agreed to    Wound Properties Date First Assessed: 12/16/20 Time First Assessed: 0130 Location: Foot Location Orientation: Anterior;Left   Dressing Type Abdominal pads;Gauze (Comment)    Dressing Changed Changed    Dressing Status Intact    Dressing Change Frequency PRN    Site / Wound Assessment Pale;Pink    % Wound base Red or Granulating 95%    % Wound base Yellow/Fibrinous Exudate 5%    Peri-wound Assessment Edema    Wound Length (cm) 2.7 cm    Wound Width (cm) 3.2 cm    Wound Surface Area (cm^2) 8.64 cm^2    Margins Attached edges (approximated)    Drainage Amount Minimal    Drainage Description Serous    Treatment Cleansed;Debridement (Selective)    Wound Properties Date First Assessed: 11/16/20 Time First Assessed: 1050 Wound Type: Other (Comment) Location: Pretibial Location Orientation: Left Wound Description (Comments): multiple blisters on anterior, lateral, posterior and medial aspect of Lt LE Present on Admission: No   Dressing Type Abdominal pads;Gauze (Comment)    Dressing Changed Changed    Dressing Change Frequency PRN    Site / Wound Assessment Pink;Pale;Yellow    % Wound base Red or Granulating 75%    Peri-wound Assessment Edema    Drainage Amount Minimal    Drainage Description Purulent;No odor    Treatment Cleansed;Debridement (Selective)    Selective Debridement - Location Lt foot, Lt LE    Selective Debridement - Tools Used Forceps    Selective  Debridement - Tissue Removed devitalized and macerated skin    Wound Therapy - Clinical Statement see below    Wound Therapy - Functional Problem List unable to fit in shoes, unable to dress due to drainage of Rt LE    Factors Delaying/Impairing Wound Healing Altered sensation;Diabetes Mellitus;Infection - systemic/local;Polypharmacy    Wound Therapy - Frequency 3X / week    Wound Plan DC to HEP    Dressing  ABD pad and kerlix  with netting                       PT Short Term Goals - 12/03/20 1618       PT SHORT TERM GOAL #1   Title Patient will be independent in self management strategies in order to reduce the risk of wound reoccurance.    Status On-going               PT Long Term Goals - 12/03/20 1618       PT LONG TERM GOAL #1   Title Patient will be able to don/doff compression garments independently in order to manage LE edema.    Baseline 12/03/20:  Reports compression garments to arrive next week    Status On-going      PT LONG TERM GOAL #2   Title PT to understand and verbalize that she needs to wear her compression garment and pump on a daily basis at D/C or edema will return    Baseline 12/03/20:  Pt does not remove dressings between treatment, reports she will receive compression garment next week.    Status On-going      PT LONG TERM GOAL #3   Title Patient will be able to wear her shoes again for improved ambulation ability.    Status On-going      PT LONG TERM GOAL #4   Title PT to have lost 6-8 cm in LE; 4 cm in foot to reduce risk of cellulitis    Status Achieved                   Plan - 12/30/20 1026     Clinical Impression Statement Profore and short stretch dressing intact upon entrance.  Noted pseudomonas drainage and multiple blister areas with pus drainage Lt LE.  Pt stated she has tried to call though unable to contact MD office and no answering machine available.  Discussed importance of calling MD to address infection and  importance of compliance.  Pt refused to roll bandages and will not wash soiled bandages.  Discussed importance of compliance for maximal lymphedema benefits and decision made to DC to MD who may need to refer to a different facility.    Personal Factors and Comorbidities Fitness;Behavior Pattern;Past/Current Experience;Comorbidity 3+;Time since onset of injury/illness/exacerbation;Transportation    Comorbidities lymphedema, HTN, obesity    Examination-Activity Limitations Bathing;Locomotion Level;Transfers;Stairs;Stand;Dressing;Hygiene/Grooming    Examination-Participation Restrictions Meal Prep;Community Activity;Volunteer;Yard Work;Shop;Laundry;Cleaning    Stability/Clinical Decision Making Evolving/Moderate complexity    Clinical Decision Making Moderate    Rehab Potential Good    PT Frequency 3x / week    PT Duration 6 weeks    PT Treatment/Interventions ADLs/Self Care Home Management;Manual techniques;Manual lymph drainage;Compression bandaging;Gait training;Stair training;Functional mobility training;Therapeutic activities;Therapeutic exercise;Balance training;Neuromuscular re-education;Vasopneumatic Device;Taping;Splinting;Energy conservation;Passive range of motion;Scar mobilization;Patient/family education;DME Instruction    PT Next Visit Plan DC to MD.    PT Home Exercise Plan ankle pumps, elevation; 4/7:  see above    Consulted and Agree with Plan of Care Patient             Patient will benefit from skilled therapeutic intervention in order to improve the following deficits and impairments:  Abnormal gait,Cardiopulmonary status limiting activity,Decreased endurance,Decreased activity tolerance,Decreased skin integrity,Pain,Increased edema,Decreased mobility  Visit Diagnosis: Bilateral lower extremity edema  Lymphedema, not elsewhere classified  Difficulty in walking, not elsewhere classified  Abscess of right leg  Generalized edema  Abscess of right lower extremity  excluding foot  Problem List Patient Active Problem List   Diagnosis Date Noted   Abscess of right lower extremity excluding foot 10/18/2020   Abscess of right leg 10/18/2020   Dehydration 10/03/2020   Leukocytosis 10/03/2020   Thrombocytosis 10/03/2020   Hyponatremia 10/03/2020   Hypochloremia 10/03/2020   Failure to thrive in adult 10/03/2020   Acute renal failure superimposed on stage 3b chronic kidney disease ([place]) 10/03/2020   Uncontrolled type 2 diabetes mellitus with hyperglycemia, without long-term current use of insulin ([place]) 10/03/2020   Acute kidney injury superimposed on CKD ([place]) 10/02/2020   Chronic kidney disease 10/01/2020   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[place]) 05/12/2020   Chronic insomnia 01/08/2020   Ocular myasthenia gravis ([place]) 09/18/2019   Peripheral edema 01/23/2019   Asthma 05/14/2015   Heel spur 02/24/2015   Depression 02/24/2015   Post-menopausal bleeding 06/02/2014   OA (osteoarthritis) of knee 04/09/2014   Epidermal cyst 10/01/2013   Class 3 obesity 02/25/2013   Back pain 03/04/2012   Gout 01/25/2012   Edema 01/28/2011   Obesity hypoventilation syndrome ([place]) 06/16/2008   OBSTRUCTIVE SLEEP APNEA 05/08/2008   Hyperlipidemia 08/28/2006   Anxiety 08/28/2006   Essential hypertension 08/28/2006   GERD 08/28/2006   Osteoarthritis 08/28/2006   Ihor Austin, LPTA/CLT; CBIS [place], PT CLT [phone]  12/30/2020, 4:20 PM  [facility] [address] [place], [place], [address] Phone: [phone]   Fax:  [phone]  Name: [name] MRN: [id number] Date of Birth: 1950-11-24

## 2021-01-01 ENCOUNTER — Encounter (HOSPITAL_COMMUNITY): Payer: Medicare Other

## 2021-01-03 ENCOUNTER — Encounter (HOSPITAL_COMMUNITY): Payer: Self-pay

## 2021-01-03 ENCOUNTER — Other Ambulatory Visit: Payer: Self-pay

## 2021-01-03 ENCOUNTER — Emergency Department (HOSPITAL_COMMUNITY)
Admission: EM | Admit: 2021-01-03 | Discharge: 2021-01-03 | Disposition: A | Discharge status: Home / Self Care | Payer: Medicare Other | Attending: Emergency Medicine | Admitting: Emergency Medicine

## 2021-01-03 DIAGNOSIS — Z87891 Personal history of nicotine dependence: Secondary | ICD-10-CM | POA: Insufficient documentation

## 2021-01-03 DIAGNOSIS — Z7984 Long term (current) use of oral hypoglycemic drugs: Secondary | ICD-10-CM | POA: Diagnosis not present

## 2021-01-03 DIAGNOSIS — E1122 Type 2 diabetes mellitus with diabetic chronic kidney disease: Secondary | ICD-10-CM | POA: Diagnosis not present

## 2021-01-03 DIAGNOSIS — R609 Edema, unspecified: Secondary | ICD-10-CM

## 2021-01-03 DIAGNOSIS — Z7982 Long term (current) use of aspirin: Secondary | ICD-10-CM | POA: Insufficient documentation

## 2021-01-03 DIAGNOSIS — M10072 Idiopathic gout, left ankle and foot: Secondary | ICD-10-CM

## 2021-01-03 DIAGNOSIS — M1711 Unilateral primary osteoarthritis, right knee: Secondary | ICD-10-CM

## 2021-01-03 DIAGNOSIS — R600 Localized edema: Secondary | ICD-10-CM

## 2021-01-03 DIAGNOSIS — N1832 Chronic kidney disease, stage 3b: Secondary | ICD-10-CM | POA: Insufficient documentation

## 2021-01-03 DIAGNOSIS — E1165 Type 2 diabetes mellitus with hyperglycemia: Secondary | ICD-10-CM

## 2021-01-03 DIAGNOSIS — L02415 Cutaneous abscess of right lower limb: Secondary | ICD-10-CM

## 2021-01-03 DIAGNOSIS — J45909 Unspecified asthma, uncomplicated: Secondary | ICD-10-CM | POA: Diagnosis not present

## 2021-01-03 DIAGNOSIS — I129 Hypertensive chronic kidney disease with stage 1 through stage 4 chronic kidney disease, or unspecified chronic kidney disease: Secondary | ICD-10-CM | POA: Insufficient documentation

## 2021-01-03 DIAGNOSIS — Z79899 Other long term (current) drug therapy: Secondary | ICD-10-CM | POA: Diagnosis not present

## 2021-01-03 NOTE — ED Notes (Signed)
Put pt on bedpan at 1pm took her off at 1.05

## 2021-01-03 NOTE — Discharge Instructions (Addendum)
Try to elevate your legs above your heart is much as possible.  We have asked home health come to your home to evaluate you and change her bandages.  Continue working on getting into see the wound care doctors in [place].

## 2021-01-03 NOTE — ED Notes (Signed)
Around 1.38 assisted RN [facility] with wrapping pt legs

## 2021-01-03 NOTE — ED Notes (Signed)
Put on the bedpan again at 1.30 took off at 1.33

## 2021-01-03 NOTE — TOC Progression Note (Signed)
Transition of Care [facility]) - Progression Note    Patient Details  Name: [name] MRN: [id number] Date of Birth: August 01, 1950  [facility]) CM/SW Contact  Natasha Bence, LCSW Phone Number: 01/03/2021, 4:55 PM  Clinical Narrative:    CSW notified of patient's need for [facility] [place] contacted [name] with [name] and [name] with [facility]. CSW received no reply. CSW previously notified on the limited availability of [facility] for wound care in [facility]. Patient agreeable to OP wound care and OPPT referral to [place] [facility]. Patient reported that she already has an appointment with the [facility] location on 06/12 but would like to be closer to [facility]. Patient reported that she is able to utilize [facility] for [facility] facilities. CSW placed order for OP wound care and OPPT to [place] with [facility]. TOC signing off.         Expected Discharge Plan and Services                                                 Social Determinants of Health (SDOH) Interventions    Readmission Risk Interventions Readmission Risk Prevention Plan 10/20/2020 10/04/2020  Transportation Screening Complete Complete  PCP or Specialist Appt within 5-7 Days - Complete  Home Care Screening - Complete  Medication Review (RN CM) - Complete  HRI or Home Care Consult Complete -  Social Work Consult for [place] Planning/Counseling Complete -  Palliative Care Screening Not Applicable -  Medication Review (RN Care Manager) Complete -  Some recent data might be hidden

## 2021-01-03 NOTE — ED Provider Notes (Signed)
[facility] EMERGENCY DEPARTMENT Provider Note   CSN: [id number] Arrival date & time: 01/03/21  1140     History Chief Complaint  Patient presents with   Leg Swelling    [name] is a 70 y.o. female.  HPI She states that her wounds of her lower legs are draining and that she needs a bandage changed.  She states that she has been referred from the wound care center in , to the wound care center in [place].  She denies weakness, dizziness, fever or chills.  There are no other known active modifying factors.      Past Medical History:  Diagnosis Date   Achilles tendinitis    Anxiety    Asthma    Depression    GERD (gastroesophageal reflux disease)    Gout    H/O myasthenia gravis    left eye   HTN (hypertension)    Hyperlipidemia    Low back pain    Obesity hypoventilation syndrome (HCC)    Obstructive sleep apnea    Osteoarthritis     Patient Active Problem List   Diagnosis Date Noted   Abscess of right lower extremity excluding foot 10/18/2020   Abscess of right leg 10/18/2020   Dehydration 10/03/2020   Leukocytosis 10/03/2020   Thrombocytosis 10/03/2020   Hyponatremia 10/03/2020   Hypochloremia 10/03/2020   Failure to thrive in adult 10/03/2020   Acute renal failure superimposed on stage 3b chronic kidney disease (HCC) 10/03/2020   Uncontrolled type 2 diabetes mellitus with hyperglycemia, without long-term current use of insulin (HCC) 10/03/2020   Acute kidney injury superimposed on CKD (HCC) 10/02/2020   Chronic kidney disease 10/01/2020   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HCC) 05/12/2020   Chronic insomnia 01/08/2020   Ocular myasthenia gravis (HCC) 09/18/2019   Peripheral edema 01/23/2019   Asthma 05/14/2015   Heel spur 02/24/2015   Depression 02/24/2015   Post-menopausal bleeding 06/02/2014   OA (osteoarthritis) of knee 04/09/2014   Epidermal cyst 10/01/2013   Class 3 obesity 02/25/2013   Back pain 03/04/2012   Gout  01/25/2012   Edema 01/28/2011   Obesity hypoventilation syndrome (HCC) 06/16/2008   OBSTRUCTIVE SLEEP APNEA 05/08/2008   Hyperlipidemia 08/28/2006   Anxiety 08/28/2006   Essential hypertension 08/28/2006   GERD 08/28/2006   Osteoarthritis 08/28/2006    Past Surgical History:  Procedure Laterality Date   CATARACT EXTRACTION W/PHACO Left 09/17/2015   Procedure: CATARACT EXTRACTION PHACO AND INTRAOCULAR LENS PLACEMENT LEFT EYE cde=8.58;  Surgeon: Kerry Hunt, MD;  Location: [facility];  Service: Ophthalmology;  Laterality: Left;   CATARACT EXTRACTION W/PHACO Right 05/15/2017   Procedure: CATARACT EXTRACTION PHACO AND INTRAOCULAR LENS PLACEMENT (IOC);  Surgeon: Hunt, Kerry, MD;  Location: [facility];  Service: Ophthalmology;  Laterality: Right;  CDE: 6.34   COLONOSCOPY N/A 12/25/2013   Procedure: COLONOSCOPY;  Surgeon: Najeeb U Rehman, MD;  Location: [facility];  Service: Endoscopy;  Laterality: N/A;  730-rescheduled Ann notified pt   COLONOSCOPY WITH PROPOFOL N/A 10/04/2019   Procedure: COLONOSCOPY WITH PROPOFOL;  Surgeon: Rehman, Najeeb U, MD;  Location: [facility];  Service: Endoscopy;  Laterality: N/A;  815   CYST EXCISION N/A 09/12/2016   Procedure: EXCISION SEBACEOUS CYST, BACK;  Surgeon: Mark Jenkins, MD;  Location: [facility];  Service: General;  Laterality: N/A;   INTRAOCULAR LENS INSERTION Bilateral 2018   Dr. Kerry Hunt    POLYPECTOMY  10/04/2019   Procedure: POLYPECTOMY;  Surgeon: Rehman, Najeeb U, MD;  Location: [facility];    Service: Endoscopy;;   TUBAL LIGATION       OB History     Gravida  3   Para  3   Term  3   Preterm      AB      Living  3      SAB      IAB      Ectopic      Multiple      Live Births              Family History  Problem Relation Age of Onset   Heart disease Mother    Thyroid disease Mother    Heart failure Father    Lung disease Father    Diabetes Brother    Crohn's disease Daughter     Social History   Tobacco Use    Smoking status: Former    Packs/day: 2.00    Years: 36.00    Pack years: 72.00    Types: Cigarettes    Start date: 06/25/1969    Quit date: 03/25/2005    Years since quitting: 15.7   Smokeless tobacco: Never  Vaping Use   Vaping Use: Never used  Substance Use Topics   Alcohol use: No    Alcohol/week: 0.0 standard drinks   Drug use: No    Home Medications Prior to Admission medications   Medication Sig Start Date End Date Taking? Authorizing Provider  acetaminophen (TYLENOL) 500 MG tablet Take 1,000 mg by mouth every 6 (six) hours as needed for mild pain.     [provider]  albuterol (VENTOLIN HFA) 108 (90 Base) MCG/ACT inhaler INHALE 2 PUFFS INTO THE LUNGS EVERY 4 HOURS AS NEEDED FOR WHEEZING OR SHORTNESS OF BREATH Patient taking differently: Inhale 2 puffs into the lungs every 4 (four) hours as needed for wheezing or shortness of breath. 01/23/20   [place], Kawanta F, MD  allopurinol (ZYLOPRIM) 100 MG tablet TAKE 1 TABLET(100 MG) BY MOUTH DAILY Patient taking differently: Take 100 mg by mouth daily. 02/28/20   [place], Kawanta F, MD  aspirin EC 81 MG tablet Take 1 tablet (81 mg total) by mouth every evening. 10/05/19   Rehman, Najeeb U, MD  atorvastatin (LIPITOR) 40 MG tablet TAKE 1 TABLET(40 MG) BY MOUTH DAILY AT 6 PM Patient taking differently: Take 40 mg by mouth daily. 08/31/20   [place], Kawanta F, MD  Blood Glucose Monitoring Suppl (BLOOD GLUCOSE SYSTEM PAK) KIT Use as directed to monitor FSBS 3x weekly. Dx: R73.09. 05/25/20   [place], Kawanta F, MD  busPIRone (BUSPAR) 5 MG tablet TAKE 1 TABLET(5 MG) BY MOUTH TWICE DAILY FOR ANXIETY Patient taking differently: Take 5 mg by mouth 2 (two) times daily. 10/16/20   [place], Kawanta F, MD  clotrimazole (LOTRIMIN) 1 % cream Apply 1 application topically 2 (two) times daily. 01/08/20   [place]mer[place], Kawanta F, MD  diphenhydrAMINE (BENADRYL) 25 MG tablet Take 25 mg by mouth daily.    [provider]  fluticasone (CUTIVATE) 0.05 % cream  APPLY EXTERNALLY TO THE AFFECTED AREA DAILY 08/13/18   [place], Kawanta F, MD  furosemide (LASIX) 40 MG tablet TAKE 1 TABLET(40 MG) BY MOUTH DAILY Patient taking differently: Take 40 mg by mouth daily. 09/08/20   [place], Kawanta F, MD  Glucose Blood (BLOOD GLUCOSE TEST STRIPS) STRP Use as directed to monitor FSBS 3x weekly. Dx: R73.09. 05/25/20   [place], Kawanta F, MD  HYDROcodone-acetaminophen (NORCO) 7.5-325 MG tablet Take 1 tablet by mouth 2 (two) times   daily as needed for moderate pain. 09/30/20   Alycia Rossetti, MD  Lancets MISC Use as directed to monitor FSBS 3x weekly. Dx: R73.09. 05/25/20   Alycia Rossetti, MD  Magnesium 250 MG TABS Take 1 tablet (250 mg total) by mouth daily. Patient taking differently: Take 250 mg by mouth daily in the afternoon. Midday 08/16/18   [place], Modena Nunnery, MD  metFORMIN (GLUCOPHAGE) 500 MG tablet Take 1 tablet (500 mg total) by mouth 2 (two) times daily with a meal. 08/12/20   [place], Modena Nunnery, MD  nystatin (MYCOSTATIN/NYSTOP) powder Apply 1 application topically 3 (three) times daily. 01/08/20   [place], Modena Nunnery, MD  omeprazole (PRILOSEC) 40 MG capsule TAKE 1 CAPSULE(40 MG) BY MOUTH DAILY Patient taking differently: Take 40 mg by mouth daily. 06/29/20   Alycia Rossetti, MD  OXYGEN Inhale 1 L into the lungs at bedtime. IN ADDITION TO CPAP NIGHTLY    [provider]  polyethylene glycol powder (MIRALAX) 17 GM/SCOOP powder Mix 1 scoop (17 grams) of powder in water daily to help with constipation. 11/08/20   Alroy Bailiff, Margaux, PA-C  potassium chloride (KLOR-CON) 10 MEQ tablet TAKE 1 TABLET BY MOUTH DAILY WITH FLUID PILL Patient taking differently: Take 10 mEq by mouth daily. With fluid pill 09/01/20   Alycia Rossetti, MD  predniSONE (DELTASONE) 10 MG tablet Take 2 tablets (20 mg total) by mouth daily. 12/01/20   [place], Earlean Polka, MD  pyridostigmine (MESTINON) 60 MG tablet Take 1 tablet (60 mg total) by mouth 3 (three) times daily. 12/01/20   [place], Earlean Polka, MD  traZODone (DESYREL) 100 MG tablet TAKE 1 TABLET BY MOUTH AT BEDTIME AS NEEDED FOR SLEEP Patient taking differently: Take 100 mg by mouth at bedtime as needed for sleep. 07/07/20   Alycia Rossetti, MD    Allergies    Patient has no known allergies.  Review of Systems   Review of Systems  All other systems reviewed and are negative.  Physical Exam Updated Vital Signs BP (!) 151/53   Pulse (!) 103   Temp 97.7 F (36.5 C) (Oral)   Resp 16   Ht 5' 4" (1.626 m)   Wt 121.6 kg   SpO2 99%   BMI 46.00 kg/m   Physical Exam Vitals and nursing note reviewed.  Constitutional:      General: She is not in acute distress.    Appearance: She is well-developed. She is obese. She is not toxic-appearing or diaphoretic.  HENT:     Head: Normocephalic and atraumatic.     Right Ear: External ear normal.     Left Ear: External ear normal.  Eyes:     Conjunctiva/sclera: Conjunctivae normal.     Pupils: Pupils are equal, round, and reactive to light.  Neck:     Trachea: Phonation normal.  Cardiovascular:     Rate and Rhythm: Normal rate.  Pulmonary:     Effort: Pulmonary effort is normal.  Abdominal:     General: There is no distension.  Musculoskeletal:        General: Normal range of motion.     Cervical back: Normal range of motion and neck supple.     Right lower leg: Edema present.     Left lower leg: Edema present.     Comments: Wounds on lower legs, arm bandage, there is obvious drainage, from these wounds that has saturated both lower leg bandages.  Bandages will be removed for further evaluation  Skin:  General: Skin is warm and dry.  Neurological:     Mental Status: She is alert and oriented to person, place, and time.     Cranial Nerves: No cranial nerve deficit.     Sensory: No sensory deficit.     Motor: No abnormal muscle tone.     Coordination: Coordination normal.  Psychiatric:        Mood and Affect: Mood normal.        Behavior: Behavior normal.         Thought Content: Thought content normal.        Judgment: Judgment normal.    ED Results / Procedures / Treatments   Labs (all labs ordered are listed, but only abnormal results are displayed) Labs Reviewed - No data to display  EKG None  Radiology No results found.  Procedures Procedures   Medications Ordered in ED Medications - No data to display  ED Course  I have reviewed the triage vital signs and the nursing notes.  Pertinent labs & imaging results that were available during my care of the patient were reviewed by me and considered in my medical decision making (see chart for details).  Clinical Course as of 01/03/21 1501  Sun Jan 03, 2021  1501 Patient has bandages on her lower legs were removed.  She has areas of blistering from fluid and open areas as well as a few excoriations.  There is no purulent discharge, no significant redness or suspected cellulitis. [EW]    Clinical Course User Index [EW] Daleen Bo, MD   MDM Rules/Calculators/A&P                           Patient Vitals for the past 24 hrs:  BP Temp Temp src Pulse Resp SpO2 Height Weight  01/03/21 1430 (!) 151/53 -- -- (!) 103 16 99 % -- --  01/03/21 1400 (!) 123/50 -- -- 100 (!) 26 99 % -- --  01/03/21 1330 (!) 132/50 -- -- 96 18 98 % -- --  01/03/21 1300 (!) 114/48 -- -- 86 15 99 % -- --  01/03/21 1230 (!) 119/50 -- -- (!) 106 19 99 % -- --  01/03/21 1201 (!) 131/57 97.7 F (36.5 C) Oral 85 16 94 % -- --  01/03/21 1154 -- -- -- -- -- -- 5' 4" (1.626 m) 121.6 kg    3:01 PM Reevaluation with update and discussion. After initial assessment and treatment, an updated evaluation reveals no change in status, her wounds have been addressed by changing bandages.  She understands that we have contacted social work to arrange for home health services. Daleen Bo   Medical Decision Making:  This patient is presenting for evaluation of leg wounds, which does require a range of treatment options, and  is a complaint that involves a moderate risk of morbidity and mortality. The differential diagnoses include chronic wounds, acute infection. I decided to review old records, and in summary patient is being treated chronically for wound infections of the lower leg.  On 12/30/2020 she was discharged from the wound care clinic because she did not participate appropriately in the care.  Specifically, she has been given bandages to wear on her lower leg wounds, but they are not disposable.  She was instructed to remove and wash them and then reapply them.  The patient has refused to do this.  Therefore she has continually soiled bandages, which is preventing her recovery.  I do not recall did not require additional historical information from anyone.   Critical Interventions-clinical evaluation  After These Interventions, the Patient was reevaluated and was found stable for discharge.  Patient with chronic lower extremity edema, and is noncompliant with recommendations of wound care.  Doubt DVT or cellulitis at this time.  I have requested home health see her to assist with dressing changes, while she works on getting to see a new wound care specialist.  CRITICAL CARE-no Performed by: Elliott Wentz  Nursing Notes Reviewed/ Care Coordinated Applicable Imaging Reviewed Interpretation of Laboratory Data incorporated into ED treatment  The patient appears reasonably screened and/or stabilized for discharge and I doubt any other medical condition or other EMC requiring further screening, evaluation, or treatment in the ED at this time prior to discharge.  Plan: Home Medications-continue; Home Treatments-elevate leg; return here if the recommended treatment, does not improve the symptoms; Recommended follow up-PCP, as needed.  Wound care as soon as possible.  Home health services.     Final Clinical Impression(s) / ED Diagnoses Final diagnoses:  Peripheral edema    Rx / DC Orders ED Discharge Orders      None        Wentz, Elliott, MD 01/03/21 1504  

## 2021-01-03 NOTE — TOC Initial Note (Signed)
Transition of Care [facility]) - Initial/Assessment Note    Patient Details  Name: [name] MRN: [id number] Date of Birth: 27-Oct-1950  [facility] CM/SW Contact:    Verdell Carmine, RN Phone Number: 01/03/2021, 4:34 PM  Clinical Narrative:                 Orders for PT and RN for [place]. Patient has wounds on legs that need to be changed and treated. Called Angela at [facility]. She spent an hour spekaing to office regarding this case. Meanwhile discussed with [facility], they have no RN available at this time in [place] area. Communicated this to [place] and RN caring for patient.  Patient may have to go to wound clinic for dressings.        Patient Goals and CMS Choice        Expected Discharge Plan and Services    Home with outpatient services for wound care                                            Prior Living Arrangements/Services                       Activities of Daily Living      Permission Sought/Granted                  Emotional Assessment              Admission diagnosis:  Leg Edema Patient Active Problem List   Diagnosis Date Noted   Abscess of right lower extremity excluding foot 10/18/2020   Abscess of right leg 10/18/2020   Dehydration 10/03/2020   Leukocytosis 10/03/2020   Thrombocytosis 10/03/2020   Hyponatremia 10/03/2020   Hypochloremia 10/03/2020   Failure to thrive in adult 10/03/2020   Acute renal failure superimposed on stage 3b chronic kidney disease ([place]) 10/03/2020   Uncontrolled type 2 diabetes mellitus with hyperglycemia, without long-term current use of insulin ([place]) 10/03/2020   Acute kidney injury superimposed on CKD ([place]) 10/02/2020   Chronic kidney disease 10/01/2020   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[place]) 05/12/2020   Chronic insomnia 01/08/2020   Ocular myasthenia gravis ([place]) 09/18/2019   Peripheral edema 01/23/2019   Asthma 05/14/2015   Heel spur 02/24/2015    Depression 02/24/2015   Post-menopausal bleeding 06/02/2014   OA (osteoarthritis) of knee 04/09/2014   Epidermal cyst 10/01/2013   Class 3 obesity 02/25/2013   Back pain 03/04/2012   Gout 01/25/2012   Edema 01/28/2011   Obesity hypoventilation syndrome ([place]) 06/16/2008   OBSTRUCTIVE SLEEP APNEA 05/08/2008   Hyperlipidemia 08/28/2006   Anxiety 08/28/2006   Essential hypertension 08/28/2006   GERD 08/28/2006   Osteoarthritis 08/28/2006   PCP:  Leslie Andrea, MD Pharmacy:   [facility] [phone] - [place], [place] - [address] AT [place] [place] [address] Phone: [phone] Fax: [phone]     Social Determinants of Health (SDOH) Interventions    Readmission Risk Interventions Readmission Risk Prevention Plan 10/20/2020 10/04/2020  Transportation Screening Complete Complete  PCP or Specialist Appt within 5-7 Days - Complete  Home Care Screening - Complete  Medication Review (RN CM) - Complete  HRI or Home Care Consult Complete -  Social Work Consult for [place] Planning/Counseling Complete -  Palliative Care Screening Not Applicable -  Medication Review (RN Care Manager) Complete -  Some recent data might be hidden

## 2021-01-05 ENCOUNTER — Encounter (HOSPITAL_COMMUNITY): Payer: Medicare Other

## 2021-01-06 ENCOUNTER — Encounter (HOSPITAL_COMMUNITY): Payer: Medicare Other | Admitting: Physical Therapy

## 2021-01-08 ENCOUNTER — Encounter (HOSPITAL_COMMUNITY): Payer: Medicare Other

## 2021-01-12 ENCOUNTER — Other Ambulatory Visit (HOSPITAL_COMMUNITY): Payer: Self-pay | Admitting: Family Medicine

## 2021-01-12 ENCOUNTER — Other Ambulatory Visit: Payer: Self-pay | Admitting: Family Medicine

## 2021-01-12 DIAGNOSIS — J45909 Unspecified asthma, uncomplicated: Secondary | ICD-10-CM

## 2021-01-14 ENCOUNTER — Ambulatory Visit (HOSPITAL_COMMUNITY)
Admission: RE | Admit: 2021-01-14 | Discharge: 2021-01-14 | Disposition: A | Discharge status: Home / Self Care | Payer: Medicare Other | Source: Ambulatory Visit | Attending: Family Medicine | Admitting: Family Medicine

## 2021-01-14 ENCOUNTER — Other Ambulatory Visit (HOSPITAL_COMMUNITY): Payer: Medicare Other

## 2021-01-14 DIAGNOSIS — J45909 Unspecified asthma, uncomplicated: Secondary | ICD-10-CM | POA: Insufficient documentation

## 2021-01-14 LAB — POCT I-STAT CREATININE: Creatinine, Ser: 1.2 mg/dL — ABNORMAL HIGH (ref 0.44–1.00)

## 2021-01-14 MED ORDER — IOHEXOL 350 MG/ML SOLN
100.0000 mL | Freq: Once | INTRAVENOUS | Status: AC | PRN
  Administered 2021-01-14: 100 mL via INTRAVENOUS

## 2021-01-18 ENCOUNTER — Encounter (HOSPITAL_COMMUNITY): Payer: Medicare Other | Admitting: Physical Therapy

## 2021-01-20 ENCOUNTER — Encounter (HOSPITAL_COMMUNITY): Payer: Medicare Other | Admitting: Physical Therapy

## 2021-01-22 ENCOUNTER — Encounter (HOSPITAL_COMMUNITY): Payer: Medicare Other

## 2021-01-27 ENCOUNTER — Encounter (HOSPITAL_BASED_OUTPATIENT_CLINIC_OR_DEPARTMENT_OTHER): Payer: Medicare Other | Attending: Internal Medicine | Admitting: Internal Medicine

## 2021-01-27 ENCOUNTER — Other Ambulatory Visit: Payer: Self-pay

## 2021-01-27 ENCOUNTER — Other Ambulatory Visit (HOSPITAL_COMMUNITY)
Admission: RE | Admit: 2021-01-27 | Discharge: 2021-01-27 | Disposition: A | Discharge status: Home / Self Care | Payer: Medicare Other | Source: Other Acute Inpatient Hospital | Attending: Internal Medicine | Admitting: Internal Medicine

## 2021-01-27 DIAGNOSIS — E785 Hyperlipidemia, unspecified: Secondary | ICD-10-CM | POA: Insufficient documentation

## 2021-01-27 DIAGNOSIS — L97828 Non-pressure chronic ulcer of other part of left lower leg with other specified severity: Secondary | ICD-10-CM | POA: Diagnosis not present

## 2021-01-27 DIAGNOSIS — G7089 Other specified myoneural disorders: Secondary | ICD-10-CM | POA: Insufficient documentation

## 2021-01-27 DIAGNOSIS — L97811 Non-pressure chronic ulcer of other part of right lower leg limited to breakdown of skin: Secondary | ICD-10-CM | POA: Insufficient documentation

## 2021-01-27 DIAGNOSIS — E662 Morbid (severe) obesity with alveolar hypoventilation: Secondary | ICD-10-CM | POA: Diagnosis not present

## 2021-01-27 DIAGNOSIS — I890 Lymphedema, not elsewhere classified: Secondary | ICD-10-CM | POA: Diagnosis not present

## 2021-01-27 DIAGNOSIS — I10 Essential (primary) hypertension: Secondary | ICD-10-CM | POA: Insufficient documentation

## 2021-01-27 DIAGNOSIS — Z9119 Patient's noncompliance with other medical treatment and regimen: Secondary | ICD-10-CM | POA: Insufficient documentation

## 2021-01-27 DIAGNOSIS — I878 Other specified disorders of veins: Secondary | ICD-10-CM | POA: Insufficient documentation

## 2021-01-27 DIAGNOSIS — L97429 Non-pressure chronic ulcer of left heel and midfoot with unspecified severity: Secondary | ICD-10-CM | POA: Diagnosis present

## 2021-01-27 DIAGNOSIS — Z6839 Body mass index (BMI) 39.0-39.9, adult: Secondary | ICD-10-CM | POA: Diagnosis not present

## 2021-01-27 DIAGNOSIS — L89620 Pressure ulcer of left heel, unstageable: Secondary | ICD-10-CM | POA: Diagnosis present

## 2021-01-27 NOTE — Progress Notes (Signed)
**Note [name]-Identified via Obfuscation** Tatro, [name] ([id number]) Visit Report for 01/27/2021 Allergy List Details Patient Name: Date of Service: [name], [name] RES H. 01/27/2021 2:45 PM Medical Record Number: [id number] Patient Account Number: [id number] Date of Birth/Sex: Treating RN: February 16, 1951 (70 y.o. Female) Deon Pilling Primary Care Forrester Blando: Leslie Andrea Other Clinician: Referring Shemekia Patane: Treating Lovada Barwick/Extender: Oretha Ellis in Treatment: 0 Allergies Active Allergies No Known Drug Allergies Allergy Notes Electronic Signature(s) Signed: 01/27/2021 6:50:03 PM By: Deon Pilling Entered By: Deon Pilling on 01/27/2021 15:58:45 -------------------------------------------------------------------------------- Arrival Information Details Patient Name: Date of Service: [name], [name] RES H. 01/27/2021 2:45 PM Medical Record Number: [id number] Patient Account Number: [id number] Date of Birth/Sex: Treating RN: 08-04-1950 (70 y.o. Female) Deon Pilling Primary Care Kilynn Fitzsimmons: Leslie Andrea Other Clinician: Referring Xavi Tomasik: Treating Verlyn Dannenberg/Extender: Oretha Ellis in Treatment: 0 Visit Information Patient Arrived: Wheel Chair Arrival Time: 15:30 Accompanied By: self Transfer Assistance: None Patient Identification Verified: Yes Secondary Verification Process Completed: Yes Patient Requires Transmission-Based Precautions: No Patient Has Alerts: No Electronic Signature(s) Signed: 01/27/2021 6:50:03 PM By: Deon Pilling Entered By: Deon Pilling on 01/27/2021 15:58:10 -------------------------------------------------------------------------------- Clinic Level of Care Assessment Details Patient Name: Date of Service: [name], [name] RES H. 01/27/2021 2:45 PM Medical Record Number: [id number] Patient Account Number: [id number] Date of Birth/Sex: Treating RN: 1950-08-29 (70 y.o. Female) Levan Hurst Primary Care Corayma Cashatt: Leslie Andrea Other  Clinician: Referring Kerby Borner: Treating Tayler Heiden/Extender: Oretha Ellis in Treatment: 0 Clinic Level of Care Assessment Items TOOL 1 Quantity Score X- 1 0 Use when EandM and Procedure is performed on INITIAL visit ASSESSMENTS - Nursing Assessment / Reassessment X- 1 20 General Physical Exam (combine w/ comprehensive assessment (listed just below) when performed on new pt. evals) X- 1 25 Comprehensive Assessment (HX, ROS, Risk Assessments, Wounds Hx, etc.) ASSESSMENTS - Wound and Skin Assessment / Reassessment []  - 0 Dermatologic / Skin Assessment (not related to wound area) ASSESSMENTS - Ostomy and/or Continence Assessment and Care []  - 0 Incontinence Assessment and Management []  - 0 Ostomy Care Assessment and Management (repouching, etc.) PROCESS - Coordination of Care X - Simple Patient / Family Education for ongoing care 1 15 []  - 0 Complex (extensive) Patient / Family Education for ongoing care X- 1 10 Staff obtains Programmer, systems, Records, T Results / Process Orders est X- 1 10 Staff telephones HHA, Nursing Homes / Clarify orders / etc []  - 0 Routine Transfer to another Facility (non-emergent condition) []  - 0 Routine Hospital Admission (non-emergent condition) X- 1 15 New Admissions / Biomedical engineer / Ordering NPWT Apligraf, etc. , []  - 0 Emergency Hospital Admission (emergent condition) PROCESS - Special Needs []  - 0 Pediatric / Minor Patient Management []  - 0 Isolation Patient Management []  - 0 Hearing / Language / Visual special needs []  - 0 Assessment of Community assistance (transportation, D/C planning, etc.) []  - 0 Additional assistance / Altered mentation []  - 0 Support Surface(s) Assessment (bed, cushion, seat, etc.) INTERVENTIONS - Miscellaneous []  - 0 External ear exam []  - 0 Patient Transfer (multiple staff / Civil Service fast streamer / Similar devices) []  - 0 Simple Staple / Suture removal (25 or less) []  - 0 Complex  Staple / Suture removal (26 or more) []  - 0 Hypo/Hyperglycemic Management (do not check if billed separately) X- 1 15 Ankle / Brachial Index (ABI) - do not check if billed separately Has the patient been seen at the hospital within the last three years: Yes Total Score: 110 Level Of Care: New/Established - Level 3 Electronic  Signature(s) Signed: 01/27/2021 7:11:13 PM By: Levan Hurst RN, BSN Entered By: Levan Hurst on 01/27/2021 18:33:47 -------------------------------------------------------------------------------- Compression Therapy Details Patient Name: Date of Service: [name], [name] RES H. 01/27/2021 2:45 PM Medical Record Number: [id number] Patient Account Number: [id number] Date of Birth/Sex: Treating RN: [birth year] (70 y.o. Female) Levan Hurst Primary Care Trashaun Streight: Leslie Andrea Other Clinician: Referring Caydence Koenig: Treating Dryden Tapley/Extender: Oretha Ellis in Treatment: 0 Compression Therapy Performed for Wound Assessment: Wound #1 Right,Anterior Lower Leg Performed By: Clinician Levan Hurst, RN Compression Type: Four Layer Post Procedure Diagnosis Same as Pre-procedure Electronic Signature(s) Signed: 01/27/2021 7:11:13 PM By: Levan Hurst RN, BSN Entered By: Levan Hurst on 01/27/2021 16:44:29 -------------------------------------------------------------------------------- Compression Therapy Details Patient Name: Date of Service: [name], [name] RES H. 01/27/2021 2:45 PM Medical Record Number: [id number] Patient Account Number: [id number] Date of Birth/Sex: Treating RN: June 04, 1951 (70 y.o. Female) Levan Hurst Primary Care Judith Demps: Leslie Andrea Other Clinician: Referring Chimamanda Siegfried: Treating Pradyun Ishman/Extender: Oretha Ellis in Treatment: 0 Compression Therapy Performed for Wound Assessment: Wound #2 Left,Circumferential Lower Leg Performed By: Clinician Levan Hurst, RN Compression Type:  Four Layer Post Procedure Diagnosis Same as Pre-procedure Electronic Signature(s) Signed: 01/27/2021 7:11:13 PM By: Levan Hurst RN, BSN Entered By: Levan Hurst on 01/27/2021 16:44:29 -------------------------------------------------------------------------------- Compression Therapy Details Patient Name: Date of Service: [name], [name] RES H. 01/27/2021 2:45 PM Medical Record Number: [id number] Patient Account Number: [id number] Date of Birth/Sex: Treating RN: Nov 29, 1950 (70 y.o. Female) Levan Hurst Primary Care Everleigh Colclasure: Leslie Andrea Other Clinician: Referring Junie Avilla: Treating Dilyn Osoria/Extender: Oretha Ellis in Treatment: 0 Compression Therapy Performed for Wound Assessment: Wound #3 Left Calcaneus Performed By: Clinician Levan Hurst, RN Compression Type: Four Layer Post Procedure Diagnosis Same as Pre-procedure Electronic Signature(s) Signed: 01/27/2021 7:11:13 PM By: Levan Hurst RN, BSN Entered By: Levan Hurst on 01/27/2021 16:44:29 -------------------------------------------------------------------------------- Encounter Discharge Information Details Patient Name: Date of Service: [name], [name] RES H. 01/27/2021 2:45 PM Medical Record Number: [id number] Patient Account Number: [id number] Date of Birth/Sex: Treating RN: [birth year] (71 y.o. Female) Deon Pilling Primary Care Fergie Sherbert: Leslie Andrea Other Clinician: Referring Brylon Brenning: Treating Devonia Farro/Extender: Oretha Ellis in Treatment: 0 Encounter Discharge Information Items Post Procedure Vitals Discharge Condition: Stable Temperature (F): 98.3 Ambulatory Status: Wheelchair Pulse (bpm): 90 Discharge Destination: Home Respiratory Rate (breaths/min): 20 Transportation: Private Auto Blood Pressure (mmHg): 119/67 Accompanied By: self Schedule Follow-up Appointment: Yes Clinical Summary of Care: Electronic Signature(s) Signed: 01/27/2021  6:50:03 PM By: Deon Pilling Entered By: Deon Pilling on 01/27/2021 18:14:58 -------------------------------------------------------------------------------- Lower Extremity Assessment Details Patient Name: Date of Service: [name], [name] RES H. 01/27/2021 2:45 PM Medical Record Number: [id number] Patient Account Number: [id number] Date of Birth/Sex: Treating RN: 1951/05/08 (70 y.o. Female) Deon Pilling Primary Care Cincere Deprey: Leslie Andrea Other Clinician: Referring Taron Mondor: Treating Kaili Castille/Extender: Oretha Ellis in Treatment: 0 Edema Assessment Assessed: Shirlyn Goltz: Yes] Patrice Paradise: Yes] Edema: [Left: Yes] [Right: Yes] Calf Left: Right: Point of Measurement: 30 cm From Medial Instep 46 cm 45 cm Ankle Left: Right: Point of Measurement: 11 cm From Medial Instep 32 cm 33 cm Knee To Floor Left: Right: From Medial Instep 39 cm 39 cm Vascular Assessment Pulses: Dorsalis Pedis Palpable: [Left:Yes] [Right:Yes] Doppler Audible: [Left:Yes] [Right:Yes] Posterior Tibial Palpable: [Left:Yes] [Right:Yes] Doppler Audible: [Left:Yes] [Right:Yes] Blood Pressure: Brachial: [Right:118] Ankle: [Left:Dorsalis Pedis: 100 0.85] [Right:Dorsalis Pedis: 98 0.83] Electronic Signature(s) Signed: 01/27/2021 6:50:03 PM By: Deon Pilling Entered By: Deon Pilling on 01/27/2021 16:16:52 -------------------------------------------------------------------------------- Multi Wound Chart Details Patient Name: Date of Service: [name],  [name] RES H. 01/27/2021 2:45 PM Medical Record Number: [id number] Patient Account Number: [id number] Date of Birth/Sex: Treating RN: Dec 16, 1950 (70 y.o. Female) Levan Hurst Primary Care Deborrah Mabin: Leslie Andrea Other Clinician: Referring Evangelos Paulino: Treating Leeta Grimme/Extender: Oretha Ellis in Treatment: 0 Vital Signs Height(in): 63 Pulse(bpm): 26 Weight(lbs): 17 Blood Pressure(mmHg): 119/67 Body Mass  Index(BMI): 40 Temperature(F): 98.3 Respiratory Rate(breaths/min): 20 Photos: [1:No Photos Right, Anterior Lower Leg] [2:No Photos Left, Circumferential Lower Leg] [3:No Photos Left Calcaneus] Wound Location: [1:Blister] [2:Gradually Appeared] [3:Not Known] Wounding Event: [1:Lymphedema] [2:Lymphedema] [3:Pressure Ulcer] Primary Etiology: [1:Cataracts, Lymphedema, Asthma,] [2:Cataracts, Lymphedema, Asthma,] [3:Cataracts, Lymphedema, Asthma,] Comorbid History: [1:Sleep Apnea, Hypertension, Gout, Osteoarthritis 07/25/2020] [2:Sleep Apnea, Hypertension, Gout, Osteoarthritis 07/25/2020] [3:Sleep Apnea, Hypertension, Gout, Osteoarthritis 12/28/2020] Date Acquired: [1:0] [2:0] [3:0] Weeks of Treatment: [1:Open] [2:Open] [3:Open] Wound Status: [1:3.3x3.2x0.1] [2:15x39x0.1] [3:0.7x0.8x0.2] Measurements L x W x D (cm) [1:8.294] [2:459.458] [3:0.44] A (cm) : rea [1:0.829] [2:45.946] [3:0.088] Volume (cm) : [1:Full Thickness Without Exposed] [2:Full Thickness Without Exposed] [3:Unstageable/Unclassified] Classification: [1:Support Structures Large] [2:Support Structures Large] [3:Medium] Exudate A mount: [1:Serosanguineous] [2:Serosanguineous] [3:Serosanguineous] Exudate Type: [1:red, brown] [2:red, brown] [3:red, brown] Exudate Color: [1:Distinct, outline attached] [2:Distinct, outline attached] [3:Distinct, outline attached] Wound Margin: [1:Large (67-100%)] [2:Large (67-100%)] [3:None Present (0%)] Granulation A mount: [1:Red, Pink] [2:Red, Pink, Pale] [3:N/A] Granulation Quality: [1:None Present (0%)] [2:None Present (0%)] [3:Large (67-100%)] Necrotic A mount: [1:N/A] [2:N/A] [3:Eschar, Adherent Slough] Necrotic Tissue: [1:Fat Layer (Subcutaneous Tissue): Yes Fat Layer (Subcutaneous Tissue): Yes Fascia: No] Exposed Structures: [1:Fascia: No Tendon: No Muscle: No Joint: No Bone: No None] [2:Fascia: No Tendon: No Muscle: No Joint: No Bone: No None] [3:Fat Layer (Subcutaneous Tissue): No Tendon: No  Muscle: No Joint: No Bone: No None] Epithelialization: [1:N/A] [2:N/A] [3:Debridement - Excisional] Debridement: Pre-procedure Verification/Time Out N/A [2:N/A] [3:16:40] Taken: [1:N/A] [2:N/A] [3:Subcutaneous, Slough] Tissue Debrided: [1:N/A] [2:N/A] [3:Skin/Subcutaneous Tissue] Level: [1:N/A] [2:N/A] [3:0.56] Debridement A (sq cm): [1:rea N/A] [2:N/A] [3:Curette] Instrument: [1:N/A] [2:N/A] [3:Swab] Specimen: [1:N/A] [2:N/A] [3:1] Number of Specimens Taken: [1:N/A] [2:N/A] [3:Minimum] Bleeding: [1:N/A] [2:N/A] [3:Pressure] Hemostasis Achieved: [1:N/A] [2:N/A] [3:5] Procedural Pain: [1:N/A] [2:N/A] [3:3] Post Procedural Pain: [1:N/A] [2:N/A] [3:Procedure was tolerated well] Debridement Treatment Response: [1:N/A] [2:N/A] [3:0.7x0.8x0.2] Post Debridement Measurements L x W x D (cm) [1:N/A] [2:N/A] [3:0.088] Post Debridement Volume: (cm) [1:N/A] [2:N/A] [3:Unstageable/Unclassified] Post Debridement Stage: [1:N/A] [2:N/A] [3:periwound red and painful to touch per] Assessment Notes: [1:Compression Therapy] [2:Compression Tolstoy [3:patient. Compression Therapy] Procedures Performed: [3:Debridement] Treatment Notes Electronic Signature(s) Signed: 01/27/2021 5:13:41 PM By: Linton Ham MD Signed: 01/27/2021 7:11:13 PM By: Levan Hurst RN, BSN Entered By: Linton Ham on 01/27/2021 16:56:19 -------------------------------------------------------------------------------- Multi-Disciplinary Care Plan Details Patient Name: Date of Service: [name], [name] RES H. 01/27/2021 2:45 PM Medical Record Number: [id number] Patient Account Number: [id number] Date of Birth/Sex: Treating RN: [birth year] (70 y.o. Female) Levan Hurst Primary Care Maleigha Colvard: Leslie Andrea Other Clinician: Referring Keshayla Schrum: Treating Clatie Kessen/Extender: Oretha Ellis in Treatment: 0 Multidisciplinary Care Plan reviewed with physician Active Inactive Abuse / Safety / Falls / Self Care  Management Nursing Diagnoses: Potential for falls Potential for injury related to falls Goals: Patient will not experience any injury related to falls Date Initiated: 01/27/2021 Target Resolution Date: 02/26/2021 Goal Status: Active Patient/caregiver will verbalize/demonstrate measures taken to prevent injury and/or falls Date Initiated: 01/27/2021 Target Resolution Date: 02/26/2021 Goal Status: Active Interventions: Assess Activities of Daily Living upon admission and as needed Assess fall risk on admission and as needed Assess: immobility, friction, shearing, incontinence upon admission and as needed Assess impairment of  mobility on admission and as needed per policy Assess personal safety and home safety (as indicated) on admission and as needed Assess self care needs on admission and as needed Provide education on personal and home safety Notes: Venous Leg Ulcer Nursing Diagnoses: Actual venous Insuffiency (use after diagnosis is confirmed) Knowledge deficit related to disease process and management Goals: Patient will maintain optimal edema control Date Initiated: 01/27/2021 Target Resolution Date: 02/26/2021 Goal Status: Active Patient/caregiver will verbalize understanding of disease process and disease management Date Initiated: 01/27/2021 Target Resolution Date: 02/26/2021 Goal Status: Active Interventions: Assess peripheral edema status every visit. Compression as ordered Provide education on venous insufficiency Notes: Wound/Skin Impairment Nursing Diagnoses: Impaired tissue integrity Knowledge deficit related to ulceration/compromised skin integrity Goals: Patient/caregiver will verbalize understanding of skin care regimen Date Initiated: 01/27/2021 Target Resolution Date: 02/26/2021 Goal Status: Active Interventions: Assess patient/caregiver ability to obtain necessary supplies Assess patient/caregiver ability to perform ulcer/skin care regimen upon admission and as  needed Assess ulceration(s) every visit Provide education on ulcer and skin care Notes: Electronic Signature(s) Signed: 01/27/2021 7:11:13 PM By: Levan Hurst RN, BSN Entered By: Levan Hurst on 01/27/2021 18:32:57 -------------------------------------------------------------------------------- Pain Assessment Details Patient Name: Date of Service: [name], [name] RES H. 01/27/2021 2:45 PM Medical Record Number: [id number] Patient Account Number: [id number] Date of Birth/Sex: Treating RN: 03-21-1951 (70 y.o. Female) Deon Pilling Primary Care Bayyinah Dukeman: Leslie Andrea Other Clinician: Referring Davon Folta: Treating Taygen Newsome/Extender: Oretha Ellis in Treatment: 0 Active Problems Location of Pain Severity and Description of Pain Patient Has Paino Yes Site Locations Pain Location: Pain Location: Generalized Pain, Pain in Ulcers Rate the pain. Current Pain Level: 8 Worst Pain Level: 10 Least Pain Level: 0 Tolerable Pain Level: 8 Pain Management and Medication Current Pain Management: Medication: No Cold Application: No Rest: No Massage: No Activity: No T.E.N.S.: No Heat Application: No Leg drop or elevation: No Is the Current Pain Management Adequate: Adequate How does your wound impact your activities of daily livingo Sleep: No Bathing: No Appetite: No Relationship With Others: No Bladder Continence: No Emotions: No Bowel Continence: No Work: No Toileting: No Drive: No Dressing: No Hobbies: No Electronic Signature(s) Signed: 01/27/2021 6:50:03 PM By: Deon Pilling Entered By: Deon Pilling on 01/27/2021 16:01:21 -------------------------------------------------------------------------------- Patient/Caregiver Education Details Patient Name: Date of Service: Casimer Lanius 7/6/2022andnbsp2:45 PM Medical Record Number: [id number] Patient Account Number: [id number] Date of Birth/Gender: Treating RN: May 27, 1951 (70 y.o. Female)  Levan Hurst Primary Care Physician: Leslie Andrea Other Clinician: Referring Physician: Treating Physician/Extender: Oretha Ellis in Treatment: 0 Education Assessment Education Provided To: Patient Education Topics Provided Safety: Methods: Explain/Verbal Responses: State content correctly Venous: Methods: Explain/Verbal Responses: State content correctly Wound/Skin Impairment: Methods: Explain/Verbal Responses: State content correctly Electronic Signature(s) Signed: 01/27/2021 7:11:13 PM By: Levan Hurst RN, BSN Entered By: Levan Hurst on 01/27/2021 18:33:08 -------------------------------------------------------------------------------- Wound Assessment Details Patient Name: Date of Service: [name], [name] RES H. 01/27/2021 2:45 PM Medical Record Number: [id number] Patient Account Number: [id number] Date of Birth/Sex: Treating RN: 03-07-1951 (70 y.o. Female) Deon Pilling Primary Care Nathanuel Cabreja: Leslie Andrea Other Clinician: Referring Vidal Lampkins: Treating Dajana Gehrig/Extender: Oretha Ellis in Treatment: 0 Wound Status Wound Number: 1 Primary Lymphedema Etiology: Wound Location: Right, Anterior Lower Leg Wound Open Wounding Event: Blister Status: Date Acquired: 07/25/2020 Comorbid Cataracts, Lymphedema, Asthma, Sleep Apnea, Hypertension, Weeks Of Treatment: 0 History: Gout, Osteoarthritis Clustered Wound: No Photos Photo Uploaded By: Sandre Kitty on 01/27/2021 17:10:03 Wound Measurements Length: (cm) 3.3 Width: (cm) 3.2 Depth: (cm)  0.1 Area: (cm) 8.294 Volume: (cm) 0.829 % Reduction in Area: % Reduction in Volume: Epithelialization: None Tunneling: No Undermining: No Wound Description Classification: Full Thickness Without Exposed Support Structures Wound Margin: Distinct, outline attached Exudate Amount: Large Exudate Type: Serosanguineous Exudate Color: red, brown Foul Odor After  Cleansing: No Slough/Fibrino No Wound Bed Granulation Amount: Large (67-100%) Exposed Structure Granulation Quality: Red, Pink Fascia Exposed: No Necrotic Amount: None Present (0%) Fat Layer (Subcutaneous Tissue) Exposed: Yes Tendon Exposed: No Muscle Exposed: No Joint Exposed: No Bone Exposed: No Treatment Notes Wound #1 (Lower Leg) Wound Laterality: Right, Anterior Cleanser Soap and Water Discharge Instruction: May shower and wash wound with dial antibacterial soap and water prior to dressing change. Wound Cleanser Discharge Instruction: Cleanse the wound with wound cleanser or normal saline prior to applying a clean dressing using gauze sponges, not tissue or cotton balls. Peri-Wound Care Triamcinolone 15 (g) Discharge Instruction: Use triamcinolone mixed with lotion, in clinic only Sween Lotion (Moisturizing lotion) Discharge Instruction: Apply moisturizing lotion as directed Topical Primary Dressing KerraCel Ag Gelling Fiber Dressing, 4x5 in (silver alginate) Discharge Instruction: Apply silver alginate to wound bed as instructed Secondary Dressing ABD Pad, 8x10 Discharge Instruction: Apply over primary dressing as directed. Zetuvit Plus 4x8 in Discharge Instruction: or other super absorbent dressing Secured With Compression Wrap FourPress (4 layer compression wrap) Discharge Instruction: Apply four layer compression as directed. May also use Miliken CoFlex 2 layer compression system as alternative. Compression Stockings Add-Ons Electronic Signature(s) Signed: 01/27/2021 6:50:03 PM By: Deon Pilling Entered By: Deon Pilling on 01/27/2021 16:19:30 -------------------------------------------------------------------------------- Wound Assessment Details Patient Name: Date of Service: [name], [name] RES H. 01/27/2021 2:45 PM Medical Record Number: [id number] Patient Account Number: [id number] Date of Birth/Sex: Treating RN: Apr 14, 1951 (70 y.o. Female) Deon Pilling Primary Care Gerrad Welker: Leslie Andrea Other Clinician: Referring Astaria Nanez: Treating Ludy Messamore/Extender: Oretha Ellis in Treatment: 0 Wound Status Wound Number: 2 Primary Lymphedema Etiology: Wound Location: Left, Circumferential Lower Leg Wound Open Wounding Event: Gradually Appeared Status: Date Acquired: 07/25/2020 Comorbid Cataracts, Lymphedema, Asthma, Sleep Apnea, Hypertension, Weeks Of Treatment: 0 History: Gout, Osteoarthritis Clustered Wound: No Photos Photo Uploaded By: Sandre Kitty on 01/27/2021 17:10:29 Wound Measurements Length: (cm) 15 Width: (cm) 39 Depth: (cm) 0.1 Area: (cm) 459.458 Volume: (cm) 45.946 % Reduction in Area: % Reduction in Volume: Epithelialization: None Tunneling: No Undermining: No Wound Description Classification: Full Thickness Without Exposed Support Structures Wound Margin: Distinct, outline attached Exudate Amount: Large Exudate Type: Serosanguineous Exudate Color: red, brown Foul Odor After Cleansing: No Slough/Fibrino No Wound Bed Granulation Amount: Large (67-100%) Exposed Structure Granulation Quality: Red, Pink, Pale Fascia Exposed: No Necrotic Amount: None Present (0%) Fat Layer (Subcutaneous Tissue) Exposed: Yes Tendon Exposed: No Muscle Exposed: No Joint Exposed: No Bone Exposed: No Treatment Notes Wound #2 (Lower Leg) Wound Laterality: Left, Circumferential Cleanser Soap and Water Discharge Instruction: May shower and wash wound with dial antibacterial soap and water prior to dressing change. Wound Cleanser Discharge Instruction: Cleanse the wound with wound cleanser or normal saline prior to applying a clean dressing using gauze sponges, not tissue or cotton balls. Peri-Wound Care Triamcinolone 15 (g) Discharge Instruction: Use triamcinolone mixed with lotion, in clinic only Sween Lotion (Moisturizing lotion) Discharge Instruction: Apply moisturizing lotion as  directed Topical Primary Dressing KerraCel Ag Gelling Fiber Dressing, 4x5 in (silver alginate) Discharge Instruction: Apply silver alginate to wound bed as instructed Secondary Dressing ABD Pad, 8x10 Discharge Instruction: Apply over primary dressing as directed. Zetuvit Plus 4x8 in Discharge Instruction:  or other super absorbent dressing Secured With Compression Wrap FourPress (4 layer compression wrap) Discharge Instruction: Apply four layer compression as directed. May also use Miliken CoFlex 2 layer compression system as alternative. Compression Stockings Add-Ons Electronic Signature(s) Signed: 01/27/2021 6:50:03 PM By: Deon Pilling Entered By: Deon Pilling on 01/27/2021 16:20:14 -------------------------------------------------------------------------------- Wound Assessment Details Patient Name: Date of Service: Pai, [name] RES H. 01/27/2021 2:45 PM Medical Record Number: [id number] Patient Account Number: [id number] Date of Birth/Sex: Treating RN: 1951/03/18 (70 y.o. Female) Deon Pilling Primary Care Amarah Brossman: Leslie Andrea Other Clinician: Referring Herschell Virani: Treating Carsin Randazzo/Extender: Oretha Ellis in Treatment: 0 Wound Status Wound Number: 3 Primary Pressure Ulcer Etiology: Wound Location: Left Calcaneus Wound Open Wounding Event: Not Known Status: Date Acquired: 12/28/2020 Comorbid Cataracts, Lymphedema, Asthma, Sleep Apnea, Hypertension, Weeks Of Treatment: 0 History: Gout, Osteoarthritis Clustered Wound: No Photos Photo Uploaded By: Sandre Kitty on 01/27/2021 17:10:44 Wound Measurements Length: (cm) 0.7 Width: (cm) 0.8 Depth: (cm) 0.2 Area: (cm) 0.44 Volume: (cm) 0.088 % Reduction in Area: % Reduction in Volume: Epithelialization: None Tunneling: No Undermining: No Wound Description Classification: Unstageable/Unclassified Wound Margin: Distinct, outline attached Exudate Amount: Medium Exudate Type:  Serosanguineous Exudate Color: red, brown Foul Odor After Cleansing: No Slough/Fibrino Yes Wound Bed Granulation Amount: None Present (0%) Exposed Structure Necrotic Amount: Large (67-100%) Fascia Exposed: No Necrotic Quality: Eschar, Adherent Slough Fat Layer (Subcutaneous Tissue) Exposed: No Tendon Exposed: No Muscle Exposed: No Joint Exposed: No Bone Exposed: No Assessment Notes periwound red and painful to touch per patient. Treatment Notes Wound #3 (Calcaneus) Wound Laterality: Left Cleanser Soap and Water Discharge Instruction: May shower and wash wound with dial antibacterial soap and water prior to dressing change. Wound Cleanser Discharge Instruction: Cleanse the wound with wound cleanser or normal saline prior to applying a clean dressing using gauze sponges, not tissue or cotton balls. Peri-Wound Care Triamcinolone 15 (g) Discharge Instruction: Use triamcinolone mixed with lotion, in clinic only Sween Lotion (Moisturizing lotion) Discharge Instruction: Apply moisturizing lotion as directed Topical Primary Dressing KerraCel Ag Gelling Fiber Dressing, 4x5 in (silver alginate) Discharge Instruction: Apply silver alginate to wound bed as instructed Secondary Dressing Woven Gauze Sponge, Non-Sterile 4x4 in Discharge Instruction: Apply over primary dressing as directed. ALLEVYN Heel 4 1/2in x 5 1/2in / 10.5cm x 13.5cm Discharge Instruction: Apply over primary dressing as directed. Secured With Compression Wrap FourPress (4 layer compression wrap) Discharge Instruction: Apply four layer compression as directed. May also use Miliken CoFlex 2 layer compression system as alternative. Compression Stockings Add-Ons Electronic Signature(s) Signed: 01/27/2021 6:50:03 PM By: Deon Pilling Entered By: Deon Pilling on 01/27/2021 16:21:37 -------------------------------------------------------------------------------- Vitals Details Patient Name: Date of Service: Koehl,  [name] RES H. 01/27/2021 2:45 PM Medical Record Number: [id number] Patient Account Number: [id number] Date of Birth/Sex: Treating RN: 02-May-1951 (70 y.o. Female) Rolin Barry, [place] Primary Care Emrie Gayle: Leslie Andrea Other Clinician: Referring Pearline Yerby: Treating Zacchaeus Halm/Extender: Oretha Ellis in Treatment: 0 Vital Signs Time Taken: 15:30 Temperature (F): 98.3 Height (in): 65 Pulse (bpm): 90 Source: Stated Respiratory Rate (breaths/min): 20 Weight (lbs): 239 Blood Pressure (mmHg): 119/67 Source: Stated Reference Range: 80 - 120 mg / dl Body Mass Index (BMI): 39.8 Electronic Signature(s) Signed: 01/27/2021 6:50:03 PM By: Deon Pilling Entered By: Deon Pilling on 01/27/2021 15:58:32

## 2021-01-27 NOTE — Progress Notes (Signed)
[name], [name] ([id number]) Visit Report for 01/27/2021 Abuse/Suicide Risk Screen Details Patient Name: Date of Service: [name], [name] RES H. 01/27/2021 2:45 PM Medical Record Number: [id number] Patient Account Number: [id number] Date of Birth/Sex: Treating RN: October 10, 1950 (70 y.o. Female) Deon Pilling Primary Care Shane Melby: Leslie Andrea Other Clinician: Referring Robinette Esters: Treating Hadley Detloff/Extender: Oretha Ellis in Treatment: 0 Abuse/Suicide Risk Screen Items Answer ABUSE RISK SCREEN: Has anyone close to you tried to hurt or harm you recentlyo No Do you feel uncomfortable with anyone in your familyo No Has anyone forced you do things that you didnt want to doo No Electronic Signature(s) Signed: 01/27/2021 6:50:03 PM By: Deon Pilling Entered By: Deon Pilling on 01/27/2021 15:59:03 -------------------------------------------------------------------------------- Activities of Daily Living Details Patient Name: Date of Service: [name], [name] RES H. 01/27/2021 2:45 PM Medical Record Number: [id number] Patient Account Number: [id number] Date of Birth/Sex: Treating RN: Dec 11, 1950 (70 y.o. Female) Deon Pilling Primary Care Cahterine Heinzel: Leslie Andrea Other Clinician: Referring Jager Koska: Treating Nicole Hafley/Extender: Oretha Ellis in Treatment: 0 Activities of Daily Living Items Answer Activities of Daily Living (Please select one for each item) Drive Automobile Not Able T Medications ake Completely Able Use T elephone Completely Able Care for Appearance Need Assistance Use T oilet Completely Able Bath / Shower Need Assistance Dress Self Need Assistance Feed Self Completely Able Walk Need Assistance Get In / Out Bed Need Assistance Housework Not Able Prepare Meals Not Able Handle Money Completely Able Shop for Self Need Assistance Electronic Signature(s) Signed: 01/27/2021 6:50:03 PM By: Deon Pilling Entered By: Deon Pilling on 01/27/2021 15:59:25 -------------------------------------------------------------------------------- Education Screening Details Patient Name: Date of Service: [name], [name] RES H. 01/27/2021 2:45 PM Medical Record Number: [id number] Patient Account Number: [id number] Date of Birth/Sex: Treating RN: 12-25-1950 (70 y.o. Female) Deon Pilling Primary Care Shonta Phillis: Leslie Andrea Other Clinician: Referring John Williamsen: Treating Paxtyn Wisdom/Extender: Oretha Ellis in Treatment: 0 Primary Learner Assessed: Patient Learning Preferences/Education Level/Primary Language Learning Preference: Explanation, Demonstration, Printed Material Highest Education Level: College or Above Preferred Language: English Cognitive Barrier Language Barrier: No Translator Needed: No Memory Deficit: No Emotional Barrier: No Cultural/Religious Beliefs Affecting Medical Care: No Physical Barrier Impaired Vision: Yes Glasses Impaired Hearing: No Decreased Hand dexterity: No Knowledge/Comprehension Knowledge Level: High Comprehension Level: High Ability to understand written instructions: High Ability to understand verbal instructions: High Motivation Anxiety Level: Calm Cooperation: Cooperative Education Importance: Acknowledges Need Interest in Health Problems: Asks Questions Perception: Coherent Willingness to Engage in Self-Management High Activities: Readiness to Engage in Self-Management High Activities: Electronic Signature(s) Signed: 01/27/2021 6:50:03 PM By: Deon Pilling Entered By: Deon Pilling on 01/27/2021 16:00:04 -------------------------------------------------------------------------------- Fall Risk Assessment Details Patient Name: Date of Service: [name], [name] RES H. 01/27/2021 2:45 PM Medical Record Number: [id number] Patient Account Number: [id number] Date of Birth/Sex: Treating RN: 1950/11/14 (69 y.o. Female) Deon Pilling Primary Care Henreitta Spittler:  Leslie Andrea Other Clinician: Referring Shekelia Boutin: Treating Emillie Chasen/Extender: Oretha Ellis in Treatment: 0 Fall Risk Assessment Items Have you had 2 or more falls in the last 12 monthso 0 Yes Have you had any fall that resulted in injury in the last 12 monthso 0 Yes FALLS RISK SCREEN History of falling - immediate or within 3 months 25 Yes Secondary diagnosis (Do you have 2 or more medical diagnoseso) 0 No Ambulatory aid None/bed rest/wheelchair/nurse 0 No Crutches/cane/walker 15 Yes Furniture 0 No Intravenous therapy Access/Saline/Heparin Lock 0 No Gait/Transferring Normal/ bed rest/ wheelchair 0 No Weak (short steps with or without shuffle, stooped  but able to lift head while walking, may seek 10 Yes support from furniture) Impaired (short steps with shuffle, may have difficulty arising from chair, head down, impaired 0 No balance) Mental Status Oriented to own ability 0 Yes Electronic Signature(s) Signed: 01/27/2021 6:50:03 PM By: Deon Pilling Entered By: Deon Pilling on 01/27/2021 16:00:15 -------------------------------------------------------------------------------- Foot Assessment Details Patient Name: Date of Service: [name], [name] RES H. 01/27/2021 2:45 PM Medical Record Number: [id number] Patient Account Number: [id number] Date of Birth/Sex: Treating RN: Nov 06, 1950 (70 y.o. Female) Deon Pilling Primary Care Kinslea Frances: Leslie Andrea Other Clinician: Referring Angelyn Osterberg: Treating Onetta Spainhower/Extender: Oretha Ellis in Treatment: 0 Foot Assessment Items Site Locations + = Sensation present, - = Sensation absent, C = Callus, U = Ulcer R = Redness, W = Warmth, M = Maceration, PU = Pre-ulcerative lesion F = Fissure, S = Swelling, D = Dryness Assessment Right: Left: Other Deformity: No No Prior Foot Ulcer: No No Prior Amputation: No No Charcot Joint: No No Ambulatory Status: Ambulatory With  Help Assistance Device: Walker Gait: Steady Electronic Signature(s) Signed: 01/27/2021 6:50:03 PM By: Deon Pilling Entered By: Deon Pilling on 01/27/2021 16:17:23 -------------------------------------------------------------------------------- Nutrition Risk Screening Details Patient Name: Date of Service: [name], [name] RES H. 01/27/2021 2:45 PM Medical Record Number: [id number] Patient Account Number: [id number] Date of Birth/Sex: Treating RN: [birth year] (70 y.o. Female) Deon Pilling Primary Care Jaree Dwight: Leslie Andrea Other Clinician: Referring Lianette Broussard: Treating Denicia Pagliarulo/Extender: Oretha Ellis in Treatment: 0 Height (in): 65 Weight (lbs): 239 Body Mass Index (BMI): 39.8 Nutrition Risk Screening Items Score Screening NUTRITION RISK SCREEN: I have an illness or condition that made me change the kind and/or amount of food I eat 2 Yes I eat fewer than two meals per day 0 No I eat few fruits and vegetables, or milk products 0 No I have three or more drinks of beer, liquor or wine almost every day 0 No I have tooth or mouth problems that make it hard for me to eat 0 No I don't always have enough money to buy the food I need 0 No I eat alone most of the time 0 No I take three or more different prescribed or over-the-counter drugs a day 1 Yes Without wanting to, I have lost or gained 10 pounds in the last six months 0 No I am not always physically able to shop, cook and/or feed myself 0 No Nutrition Protocols Good Risk Protocol Moderate Risk Protocol 0 Provide education on nutrition High Risk Proctocol Risk Level: Moderate Risk Score: 3 Electronic Signature(s) Signed: 01/27/2021 6:50:03 PM By: Deon Pilling Entered By: Deon Pilling on 01/27/2021 16:00:54

## 2021-01-28 ENCOUNTER — Other Ambulatory Visit (HOSPITAL_COMMUNITY): Payer: Self-pay | Admitting: Internal Medicine

## 2021-01-28 ENCOUNTER — Ambulatory Visit (HOSPITAL_COMMUNITY)
Admission: RE | Admit: 2021-01-28 | Discharge: 2021-01-28 | Disposition: A | Discharge status: Home / Self Care | Payer: Medicare Other | Source: Ambulatory Visit | Attending: Internal Medicine | Admitting: Internal Medicine

## 2021-01-28 DIAGNOSIS — L98499 Non-pressure chronic ulcer of skin of other sites with unspecified severity: Secondary | ICD-10-CM

## 2021-01-28 NOTE — Progress Notes (Signed)
NORELLE, RUNNION ([id number]) Visit Report for 01/27/2021 Chief Complaint Document Details Patient Name: Date of Service: [name], [name] RES H. 01/27/2021 2:45 PM Medical Record Number: [id number] Patient Account Number: [id number] Date of Birth/Sex: Treating RN: [birth year] (70 y.o. Female) Levan Hurst Primary Care Provider: Leslie Andrea Other Clinician: Referring Provider: Treating Provider/Extender: Oretha Ellis in Treatment: 0 Information Obtained from: Patient Chief Complaint 01/27/2021; patient is here for review of the punched-out area on her left plantar heel as well as circumferential wounds on the left leg and at least 2 weeping areas on the right leg in the setting of severe lymphedema Electronic Signature(s) Signed: 01/27/2021 5:13:41 PM By: Linton Ham MD Entered By: Linton Ham on 01/27/2021 17:00:23 -------------------------------------------------------------------------------- Debridement Details Patient Name: Date of Service: Georgana Curio RES H. 01/27/2021 2:45 PM Medical Record Number: [id number] Patient Account Number: [id number] Date of Birth/Sex: Treating RN: 09/12/1950 (70 y.o. Female) Levan Hurst Primary Care Provider: Leslie Andrea Other Clinician: Referring Provider: Treating Provider/Extender: Oretha Ellis in Treatment: 0 Debridement Performed for Assessment: Wound #3 Left Calcaneus Performed By: Physician Ricard Dillon., MD Debridement Type: Debridement Level of Consciousness (Pre-procedure): Awake and Alert Pre-procedure Verification/Time Out Yes - 16:40 Taken: Start Time: 16:40 T Area Debrided (L x W): otal 0.7 (cm) x 0.8 (cm) = 0.56 (cm) Tissue and other material debrided: Viable, Non-Viable, Slough, Subcutaneous, Biofilm, Slough Level: Skin/Subcutaneous Tissue Debridement Description: Excisional Instrument: Curette Specimen: Swab, Number of Specimens T aken: 1 Bleeding:  Minimum Hemostasis Achieved: Pressure End Time: 16:42 Procedural Pain: 5 Post Procedural Pain: 3 Response to Treatment: Procedure was tolerated well Level of Consciousness (Post- Awake and Alert procedure): Post Debridement Measurements of Total Wound Length: (cm) 0.7 Stage: Unstageable/Unclassified Width: (cm) 0.8 Depth: (cm) 0.2 Volume: (cm) 0.088 Character of Wound/Ulcer Post Debridement: Requires Further Debridement Post Procedure Diagnosis Same as Pre-procedure Electronic Signature(s) Signed: 01/27/2021 5:13:41 PM By: Linton Ham MD Signed: 01/27/2021 7:11:13 PM By: Levan Hurst RN, BSN Entered By: Linton Ham on 01/27/2021 16:56:35 -------------------------------------------------------------------------------- HPI Details Patient Name: Date of Service: [name], [name] RES H. 01/27/2021 2:45 PM Medical Record Number: [id number] Patient Account Number: [id number] Date of Birth/Sex: Treating RN: 1951/04/18 (70 y.o. Female) Levan Hurst Primary Care Provider: Leslie Andrea Other Clinician: Referring Provider: Treating Provider/Extender: Oretha Ellis in Treatment: 0 History of Present Illness HPI Description: ADMISSION 01/27/2021 This is a 70 year old woman who lives in [place]. She came down here by public transportation I believe. She had been followed up to the beginning of last month at the therapy wound care clinic in [place] however she was dismissed because of noncompliance with recommendations which includes getting family members to change her dressing, washing dressings and reapplying them etc. Very clear from the tone of their last note that they were really quite frustrated. She is listed as having lymphedema in her problem list. She comes in today with wraps involving her feet that went up just above her ankles. She has a punched-out wound on the left plantar heel which is erythematous and painful. She has circumferential  skin breakdown on the left lower leg with weeping edema fluid. More dry flaking skin on the right leg with at least 2 areas that are weeping edema fluid. These would not be well covered by the dressings that she had put on. Past medical history includes bilateral lymphedema, venous stasis, ocular myasthenia, hypertension, hyperlipidemia, obesity hypoventilation syndrome with obstructive sleep apnea. She uses CPAP at night. Notable that she had venous  reflux studies done at [facility] vascular on 09/28/2020. There was no evidence of DVT from the common femoral through the popliteal veins no evidence of superficial venous thrombosis. On the right she had reflux in the right greater saphenous vein on the left venous reflux at the left saphenofemoral junction as well as venous reflux in the left greater saphenous vein at the knee and in the calf there was also venous reflux in the left short saphenous vein. At this point I have not reviewed her follow-up with vascular if she had any ABIs in our clinic were 0.83 on the right 0.85 on the left Electronic Signature(s) Signed: 01/27/2021 5:13:41 PM By: Linton Ham MD Entered By: Linton Ham on 01/27/2021 17:04:23 -------------------------------------------------------------------------------- Physical Exam Details Patient Name: Date of Service: [name], [name] RES H. 01/27/2021 2:45 PM Medical Record Number: [id number] Patient Account Number: [id number] Date of Birth/Sex: Treating RN: 1951-01-29 (70 y.o. Female) Levan Hurst Primary Care Provider: Leslie Andrea Other Clinician: Referring Provider: Treating Provider/Extender: Oretha Ellis in Treatment: 0 Constitutional Sitting or standing Blood Pressure is within target range for patient.. Pulse regular and within target range for patient.Marland Kitchen Respirations regular, non-labored and within target range.. Temperature is normal and within the target range for the patient.Marland Kitchen  Appears in no distress. Respiratory work of breathing is normal. Bilateral breath sounds are clear and equal in all lobes with no wheezes, rales or rhonchi.. Cardiovascular Heart sounds actually sounded fairly normal no gallops. Her jugular venous pressure is elevated. Pedal pulses palpable bilaterally also at the popliteal and femoral. Edema present in both extremities. This is 3+ nonpitting. There is chronic erythema especially in the left leg. Dry flaking skin on the right.. Notes Wound exam The most problematic area is on the left plantar heel. This is a small punched out wound completely necrotic surface. There is surrounding erythema and tenderness. I used a #3 curette to attempt debridement but I am not able to reach to viable tissue. The patient found this very uncomfortable On the left leg she has circumferential erythema in the mid aspect. Weeping edema and the epithelialization all and over the entirety of this area On the right leg she has dry flaking denuding skin. There is at least 2 weeping areas 1 on the right anterior and 1 on the right lateral just above the malleolus Electronic Signature(s) Signed: 01/27/2021 5:13:41 PM By: Linton Ham MD Entered By: Linton Ham on 01/27/2021 17:06:52 -------------------------------------------------------------------------------- Physician Orders Details Patient Name: Date of Service: [name], [name] RES H. 01/27/2021 2:45 PM Medical Record Number: [id number] Patient Account Number: [id number] Date of Birth/Sex: Treating RN: 1950-12-26 (70 y.o. Female) Levan Hurst Primary Care Provider: Leslie Andrea Other Clinician: Referring Provider: Treating Provider/Extender: Oretha Ellis in Treatment: 0 Verbal / Phone Orders: No Diagnosis Coding Follow-up Appointments ppointment in 1 week. - with Dr. Dellia Nims Return A Bathing/ Shower/ Hygiene May shower with protection but do not get wound dressing(s) wet. -  Use cast protector - can buy at [facility], [facility], or [employer] Edema Control - Lymphedema / SCD / Other Elevate legs to the level of the heart or above for 30 minutes daily and/or when sitting, a frequency of: - throughout the day Avoid standing for long periods of time. Exercise regularly [facility] for wound care. May utilize formulary equivalent dressing for wound treatment orders unless otherwise specified. - A skilled nursing once a week for wound care Wound Treatment Wound #1 - Lower Leg  Wound Laterality: Right, Anterior Cleanser: Soap and Water (Home Health) 1 x Per Week/7 Days Discharge Instructions: May shower and wash wound with dial antibacterial soap and water prior to dressing change. Cleanser: Wound Cleanser (Home Health) 1 x Per Week/7 Days Discharge Instructions: Cleanse the wound with wound cleanser or normal saline prior to applying a clean dressing using gauze sponges, not tissue or cotton balls. Peri-Wound Care: Triamcinolone 15 (g) 1 x Per Week/7 Days Discharge Instructions: Use triamcinolone mixed with lotion, in clinic only Peri-Wound Care: Sween Lotion (Moisturizing lotion) ([place]) 1 x Per Week/7 Days Discharge Instructions: Apply moisturizing lotion as directed Prim Dressing: KerraCel Ag Gelling Fiber Dressing, 4x5 in (silver alginate) (Home Health) 1 x Per Week/7 Days ary Discharge Instructions: Apply silver alginate to wound bed as instructed Secondary Dressing: ABD Pad, 8x10 (Home Health) 1 x Per Week/7 Days Discharge Instructions: Apply over primary dressing as directed. Secondary Dressing: Zetuvit Plus 4x8 in (Home Health) 1 x Per Week/7 Days Discharge Instructions: or other super absorbent dressing Compression Wrap: FourPress (4 layer compression wrap) 1 x Per Week/7 Days Discharge Instructions: Apply four layer compression as directed. May also use [place] CoFlex 2 layer compression system as alternative. Wound #2 - Lower Leg Wound  Laterality: Left, Circumferential Cleanser: Soap and Water (Home Health) 1 x Per Week/7 Days Discharge Instructions: May shower and wash wound with dial antibacterial soap and water prior to dressing change. Cleanser: Wound Cleanser (Home Health) 1 x Per Week/7 Days Discharge Instructions: Cleanse the wound with wound cleanser or normal saline prior to applying a clean dressing using gauze sponges, not tissue or cotton balls. Peri-Wound Care: Triamcinolone 15 (g) 1 x Per Week/7 Days Discharge Instructions: Use triamcinolone mixed with lotion, in clinic only Peri-Wound Care: Sween Lotion (Moisturizing lotion) ([place]) 1 x Per Week/7 Days Discharge Instructions: Apply moisturizing lotion as directed Prim Dressing: KerraCel Ag Gelling Fiber Dressing, 4x5 in (silver alginate) (Home Health) 1 x Per Week/7 Days ary Discharge Instructions: Apply silver alginate to wound bed as instructed Secondary Dressing: ABD Pad, 8x10 (Home Health) 1 x Per Week/7 Days Discharge Instructions: Apply over primary dressing as directed. Secondary Dressing: Zetuvit Plus 4x8 in (Home Health) 1 x Per Week/7 Days Discharge Instructions: or other super absorbent dressing Compression Wrap: FourPress (4 layer compression wrap) 1 x Per Week/7 Days Discharge Instructions: Apply four layer compression as directed. May also use [place] CoFlex 2 layer compression system as alternative. Wound #3 - Calcaneus Wound Laterality: Left Cleanser: Soap and Water (Home Health) 1 x Per Week/7 Days Discharge Instructions: May shower and wash wound with dial antibacterial soap and water prior to dressing change. Cleanser: Wound Cleanser (Home Health) 1 x Per Week/7 Days Discharge Instructions: Cleanse the wound with wound cleanser or normal saline prior to applying a clean dressing using gauze sponges, not tissue or cotton balls. Peri-Wound Care: Triamcinolone 15 (g) 1 x Per Week/7 Days Discharge Instructions: Use triamcinolone mixed  with lotion, in clinic only Peri-Wound Care: Sween Lotion (Moisturizing lotion) ([place]) 1 x Per Week/7 Days Discharge Instructions: Apply moisturizing lotion as directed Prim Dressing: KerraCel Ag Gelling Fiber Dressing, 4x5 in (silver alginate) (Home Health) 1 x Per Week/7 Days ary Discharge Instructions: Apply silver alginate to wound bed as instructed Secondary Dressing: Woven Gauze Sponge, Non-Sterile 4x4 in (Home Health) 1 x Per Week/7 Days Discharge Instructions: Apply over primary dressing as directed. Secondary Dressing: ALLEVYN Heel 4 1/2in x 5 1/2in / 10.5cm x 13.5cm (Home Health) 1 x  Per Week/7 Days Discharge Instructions: Apply over primary dressing as directed. Compression Wrap: FourPress (4 layer compression wrap) 1 x Per Week/7 Days Discharge Instructions: Apply four layer compression as directed. May also use [place] CoFlex 2 layer compression system as alternative. Laboratory naerobe culture (MICRO) - Left heel Bacteria identified in Unspecified specimen by A LOINC Code: 932-3 Convenience Name: Anerobic culture Radiology X-ray, left heel - Non healing wound on left heel Patient Medications llergies: No Known Drug Allergies A Notifications Medication Indication Start End Wound infection left 01/27/2021 doxycycline monohydrate foot DOSE oral 100 mg capsule - 1capsule oral twice daily for 7 days Electronic Signature(s) Signed: 01/27/2021 5:08:41 PM By: Linton Ham MD Entered By: Linton Ham on 01/27/2021 17:08:40 Prescription 01/27/2021 -------------------------------------------------------------------------------- Karrie Meres Olevia Bowens MD Patient Name: Provider: [birth year] [id number] Date of Birth: NPI#: Female [id number] Sex: DEA #: [phone] [id number] Phone #: License #: [place] Patient Address: [place] [address] [place], [place] [address] [place],   [address] [phone] Allergies No Known Drug Allergies Provider's Orders X-ray, left heel - Non healing wound on left heel Hand Signature: Date(s): Electronic Signature(s) Signed: 01/27/2021 5:13:41 PM By: Linton Ham MD Entered By: Linton Ham on 01/27/2021 17:08:41 -------------------------------------------------------------------------------- Problem List Details Patient Name: Date of Service: [name], [name] RES H. 01/27/2021 2:45 PM Medical Record Number: [id number] Patient Account Number: [id number] Date of Birth/Sex: Treating RN: 04-26-1951 (70 y.o. Female) Levan Hurst Primary Care Provider: Leslie Andrea Other Clinician: Referring Provider: Treating Provider/Extender: Oretha Ellis in Treatment: 0 Active Problems ICD-10 Encounter Code Description Active Date MDM Diagnosis L89.620 Pressure ulcer of left heel, unstageable 01/27/2021 No Yes I89.0 Lymphedema, not elsewhere classified 01/27/2021 No Yes L97.828 Non-pressure chronic ulcer of other part of left lower leg with other specified 01/27/2021 No Yes severity L97.811 Non-pressure chronic ulcer of other part of right lower leg limited to breakdown 01/27/2021 No Yes of skin Inactive Problems Resolved Problems Electronic Signature(s) Signed: 01/27/2021 5:13:41 PM By: Linton Ham MD Entered By: Linton Ham on 01/27/2021 16:56:07 -------------------------------------------------------------------------------- Progress Note Details Patient Name: Date of Service: [name], [name] RES H. 01/27/2021 2:45 PM Medical Record Number: [id number] Patient Account Number: [id number] Date of Birth/Sex: Treating RN: [birth year] (70 y.o. Female) Levan Hurst Primary Care Provider: Leslie Andrea Other Clinician: Referring Provider: Treating Provider/Extender: Oretha Ellis in Treatment: 0 Subjective Chief Complaint Information obtained from Patient 01/27/2021; patient is  here for review of the punched-out area on her left plantar heel as well as circumferential wounds on the left leg and at least 2 weeping areas on the right leg in the setting of severe lymphedema History of Present Illness (HPI) ADMISSION 01/27/2021 This is a 70 year old woman who lives in [place]. She came down here by public transportation I believe. She had been followed up to the beginning of last month at the therapy wound care clinic in [place] however she was dismissed because of noncompliance with recommendations which includes getting family members to change her dressing, washing dressings and reapplying them etc. Very clear from the tone of their last note that they were really quite frustrated. She is listed as having lymphedema in her problem list. She comes in today with wraps involving her feet that went up just above her ankles. She has a punched-out wound on the left plantar heel which is erythematous and painful. She has circumferential skin breakdown on the left lower leg with weeping edema fluid. More dry  flaking skin on the right leg with at least 2 areas that are weeping edema fluid. These would not be well covered by the dressings that she had put on. Past medical history includes bilateral lymphedema, venous stasis, ocular myasthenia, hypertension, hyperlipidemia, obesity hypoventilation syndrome with obstructive sleep apnea. She uses CPAP at night. Notable that she had venous reflux studies done at [facility] vein and vascular on 09/28/2020. There was no evidence of DVT from the common femoral through the popliteal veins no evidence of superficial venous thrombosis. On the right she had reflux in the right greater saphenous vein on the left venous reflux at the left saphenofemoral junction as well as venous reflux in the left greater saphenous vein at the knee and in the calf there was also venous reflux in the left short saphenous vein. At this point I have not reviewed her  follow-up with vascular if she had any ABIs in our clinic were 0.83 on the right 0.85 on the left Patient History Information obtained from Patient. Allergies No Known Drug Allergies Family History Diabetes - Siblings, Heart Disease - Mother,Father, Lung Disease - Father, Thyroid Problems - Mother, No family history of Cancer, Hereditary Spherocytosis, Hypertension, Kidney Disease, Seizures, Stroke, Tuberculosis. Social History Former smoker - quit 2006, Marital Status - Widowed, Alcohol Use - Never, Drug Use - No History, Caffeine Use - Never. Medical History Eyes Patient has history of Cataracts - bilateral removed Denies history of Glaucoma, Optic Neuritis Ear/Nose/Mouth/Throat Denies history of Chronic sinus problems/congestion, Middle ear problems Hematologic/Lymphatic Patient has history of Lymphedema - bilateral legs Denies history of Anemia, Hemophilia, Human Immunodeficiency Virus, Sickle Cell Disease Respiratory Patient has history of Asthma, Sleep Apnea - CPAP 02 at night Denies history of Aspiration, Chronic Obstructive Pulmonary Disease (COPD), Pneumothorax, Tuberculosis Cardiovascular Patient has history of Hypertension Denies history of Angina, Arrhythmia, Congestive Heart Failure, Coronary Artery Disease, Deep Vein Thrombosis, Hypotension, Myocardial Infarction, Peripheral Arterial Disease, Peripheral Venous Disease, Phlebitis, Vasculitis Gastrointestinal Denies history of Cirrhosis , Colitis, Crohnoos, Hepatitis A, Hepatitis B, Hepatitis C Endocrine Denies history of Type I Diabetes, Type II Diabetes Genitourinary Denies history of End Stage Renal Disease Immunological Denies history of Lupus Erythematosus, Raynaudoos, Scleroderma Musculoskeletal Patient has history of Gout, Osteoarthritis Denies history of Rheumatoid Arthritis, Osteomyelitis Neurologic Denies history of Dementia, Neuropathy, Quadriplegia, Paraplegia, Seizure Disorder Oncologic Denies  history of Received Chemotherapy, Received Radiation Psychiatric Denies history of Anorexia/bulimia, Confinement Anxiety Medical A Surgical History Notes nd Constitutional Symptoms (General Health) myasthenia gravis Respiratory obesity hypoventilation syndrome Cardiovascular hyperlipidemia Gastrointestinal GERD Endocrine check blood glucose for a period of time related to long term use of steroids x6 months. Review of Systems (ROS) Constitutional Symptoms (General Health) Denies complaints or symptoms of Fatigue, Fever, Chills, Marked Weight Change. Eyes Complains or has symptoms of Glasses / Contacts - glasses. Denies complaints or symptoms of Dry Eyes, Vision Changes. Ear/Nose/Mouth/Throat Denies complaints or symptoms of Chronic sinus problems or rhinitis. Respiratory Denies complaints or symptoms of Chronic or frequent coughs, Shortness of Breath. Cardiovascular Denies complaints or symptoms of Chest pain. Gastrointestinal Denies complaints or symptoms of Frequent diarrhea, Nausea, Vomiting. Endocrine Denies complaints or symptoms of Heat/cold intolerance. Genitourinary Denies complaints or symptoms of Frequent urination. Musculoskeletal Denies complaints or symptoms of Muscle Pain, Muscle Weakness. Neurologic Denies complaints or symptoms of Numbness/parasthesias. Psychiatric Denies complaints or symptoms of Claustrophobia, Suicidal. Objective Constitutional Sitting or standing Blood Pressure is within target range for patient.. Pulse regular and within target range for patient.Marland Kitchen Respirations regular, non-labored and  within target range.. Temperature is normal and within the target range for the patient.Marland Kitchen Appears in no distress. Vitals Time Taken: 3:30 PM, Height: 65 in, Source: Stated, Weight: 239 lbs, Source: Stated, BMI: 39.8, Temperature: 98.3 F, Pulse: 90 bpm, Respiratory Rate: 20 breaths/min, Blood Pressure: 119/67 mmHg. Respiratory work of breathing is  normal. Bilateral breath sounds are clear and equal in all lobes with no wheezes, rales or rhonchi.. Cardiovascular Heart sounds actually sounded fairly normal no gallops. Her jugular venous pressure is elevated. Pedal pulses palpable bilaterally also at the popliteal and femoral. Edema present in both extremities. This is 3+ nonpitting. There is chronic erythema especially in the left leg. Dry flaking skin on the right.. General Notes: Wound exam oo The most problematic area is on the left plantar heel. This is a small punched out wound completely necrotic surface. There is surrounding erythema and tenderness. I used a #3 curette to attempt debridement but I am not able to reach to viable tissue. The patient found this very uncomfortable oo On the left leg she has circumferential erythema in the mid aspect. Weeping edema and the epithelialization all and over the entirety of this area oo On the right leg she has dry flaking denuding skin. There is at least 2 weeping areas 1 on the right anterior and 1 on the right lateral just above the malleolus Integumentary (Hair, Skin) Wound #1 status is Open. Original cause of wound was Blister. The date acquired was: 07/25/2020. The wound is located on the Right,Anterior Lower Leg. The wound measures 3.3cm length x 3.2cm width x 0.1cm depth; 8.294cm^2 area and 0.829cm^3 volume. There is Fat Layer (Subcutaneous Tissue) exposed. There is no tunneling or undermining noted. There is a large amount of serosanguineous drainage noted. The wound margin is distinct with the outline attached to the wound base. There is large (67-100%) red, pink granulation within the wound bed. There is no necrotic tissue within the wound bed. Wound #2 status is Open. Original cause of wound was Gradually Appeared. The date acquired was: 07/25/2020. The wound is located on the Left,Circumferential Lower Leg. The wound measures 15cm length x 39cm width x 0.1cm depth; 459.458cm^2 area and  45.946cm^3 volume. There is Fat Layer (Subcutaneous Tissue) exposed. There is no tunneling or undermining noted. There is a large amount of serosanguineous drainage noted. The wound margin is distinct with the outline attached to the wound base. There is large (67-100%) red, pink, pale granulation within the wound bed. There is no necrotic tissue within the wound bed. Wound #3 status is Open. Original cause of wound was Not Known. The date acquired was: 12/28/2020. The wound is located on the Left Calcaneus. The wound measures 0.7cm length x 0.8cm width x 0.2cm depth; 0.44cm^2 area and 0.088cm^3 volume. There is no tunneling or undermining noted. There is a medium amount of serosanguineous drainage noted. The wound margin is distinct with the outline attached to the wound base. There is no granulation within the wound bed. There is a large (67-100%) amount of necrotic tissue within the wound bed including Eschar and Adherent Slough. General Notes: periwound red and painful to touch per patient. Assessment Active Problems ICD-10 Pressure ulcer of left heel, unstageable Lymphedema, not elsewhere classified Non-pressure chronic ulcer of other part of left lower leg with other specified severity Non-pressure chronic ulcer of other part of right lower leg limited to breakdown of skin Procedures Wound #3 Pre-procedure diagnosis of Wound #3 is a Pressure Ulcer located on the Left  Calcaneus . There was a Excisional Skin/Subcutaneous Tissue Debridement with a total area of 0.56 sq cm performed by Ricard Dillon., MD. With the following instrument(s): Curette to remove Viable and Non-Viable tissue/material. Material removed includes Subcutaneous Tissue, Slough, and Biofilm. 1 specimen was taken by a Swab and sent to the lab per facility protocol. A time out was conducted at 16:40, prior to the start of the procedure. A Minimum amount of bleeding was controlled with Pressure. The procedure was tolerated  well with a pain level of 5 throughout and a pain level of 3 following the procedure. Post Debridement Measurements: 0.7cm length x 0.8cm width x 0.2cm depth; 0.088cm^3 volume. Post debridement Stage noted as Unstageable/Unclassified. Character of Wound/Ulcer Post Debridement requires further debridement. Post procedure Diagnosis Wound #3: Same as Pre-Procedure Pre-procedure diagnosis of Wound #3 is a Pressure Ulcer located on the Left Calcaneus . There was a Four Layer Compression Therapy Procedure by Levan Hurst, RN. Post procedure Diagnosis Wound #3: Same as Pre-Procedure Wound #1 Pre-procedure diagnosis of Wound #1 is a Lymphedema located on the Right,Anterior Lower Leg . There was a Four Layer Compression Therapy Procedure by Levan Hurst, RN. Post procedure Diagnosis Wound #1: Same as Pre-Procedure Wound #2 Pre-procedure diagnosis of Wound #2 is a Lymphedema located on the Left,Circumferential Lower Leg . There was a Four Layer Compression Therapy Procedure by Levan Hurst, RN. Post procedure Diagnosis Wound #2: Same as Pre-Procedure Plan Follow-up Appointments: Return Appointment in 1 week. - with Dr. Arcola Jansky Shower/ Hygiene: May shower with protection but do not get wound dressing(s) wet. - Use cast protector - can buy at [facility], [facility], or [employer] Edema Control - Lymphedema / SCD / Other: Elevate legs to the level of the heart or above for 30 minutes daily and/or when sitting, a frequency of: - throughout the day Avoid standing for long periods of time. Exercise regularly Home Health: Admit to [facility] for wound care. May utilize formulary equivalent dressing for wound treatment orders unless otherwise specified. - skilled nursing once a week for wound care Laboratory ordered were: Anerobic culture - Left heel Radiology ordered were: X-ray, left heel - Non healing wound on left heel The following medication(s) was prescribed: doxycycline monohydrate oral 100  mg capsule 1capsule oral twice daily for 7 days for Wound infection left foot starting 01/27/2021 WOUND #1: - Lower Leg Wound Laterality: Right, Anterior Cleanser: Soap and Water (Home Health) 1 x Per Week/7 Days Discharge Instructions: May shower and wash wound with dial antibacterial soap and water prior to dressing change. Cleanser: Wound Cleanser (Home Health) 1 x Per Week/7 Days Discharge Instructions: Cleanse the wound with wound cleanser or normal saline prior to applying a clean dressing using gauze sponges, not tissue or cotton balls. Peri-Wound Care: Triamcinolone 15 (g) 1 x Per Week/7 Days Discharge Instructions: Use triamcinolone mixed with lotion, in clinic only Peri-Wound Care: Sween Lotion (Moisturizing lotion) ([place]) 1 x Per Week/7 Days Discharge Instructions: Apply moisturizing lotion as directed Prim Dressing: KerraCel Ag Gelling Fiber Dressing, 4x5 in (silver alginate) (Home Health) 1 x Per Week/7 Days ary Discharge Instructions: Apply silver alginate to wound bed as instructed Secondary Dressing: ABD Pad, 8x10 (Home Health) 1 x Per Week/7 Days Discharge Instructions: Apply over primary dressing as directed. Secondary Dressing: Zetuvit Plus 4x8 in (Home Health) 1 x Per Week/7 Days Discharge Instructions: or other super absorbent dressing Com pression Wrap: FourPress (4 layer compression wrap) 1 x Per Week/7 Days Discharge Instructions: Apply four  layer compression as directed. May also use [place] CoFlex 2 layer compression system as alternative. WOUND #2: - Lower Leg Wound Laterality: Left, Circumferential Cleanser: Soap and Water (Home Health) 1 x Per Week/7 Days Discharge Instructions: May shower and wash wound with dial antibacterial soap and water prior to dressing change. Cleanser: Wound Cleanser (Home Health) 1 x Per Week/7 Days Discharge Instructions: Cleanse the wound with wound cleanser or normal saline prior to applying a clean dressing using gauze sponges,  not tissue or cotton balls. Peri-Wound Care: Triamcinolone 15 (g) 1 x Per Week/7 Days Discharge Instructions: Use triamcinolone mixed with lotion, in clinic only Peri-Wound Care: Sween Lotion (Moisturizing lotion) ([place]) 1 x Per Week/7 Days Discharge Instructions: Apply moisturizing lotion as directed Prim Dressing: KerraCel Ag Gelling Fiber Dressing, 4x5 in (silver alginate) (Home Health) 1 x Per Week/7 Days ary Discharge Instructions: Apply silver alginate to wound bed as instructed Secondary Dressing: ABD Pad, 8x10 (Home Health) 1 x Per Week/7 Days Discharge Instructions: Apply over primary dressing as directed. Secondary Dressing: Zetuvit Plus 4x8 in (Home Health) 1 x Per Week/7 Days Discharge Instructions: or other super absorbent dressing Com pression Wrap: FourPress (4 layer compression wrap) 1 x Per Week/7 Days Discharge Instructions: Apply four layer compression as directed. May also use [place] CoFlex 2 layer compression system as alternative. WOUND #3: - Calcaneus Wound Laterality: Left Cleanser: Soap and Water (Home Health) 1 x Per Week/7 Days Discharge Instructions: May shower and wash wound with dial antibacterial soap and water prior to dressing change. Cleanser: Wound Cleanser (Home Health) 1 x Per Week/7 Days Discharge Instructions: Cleanse the wound with wound cleanser or normal saline prior to applying a clean dressing using gauze sponges, not tissue or cotton balls. Peri-Wound Care: Triamcinolone 15 (g) 1 x Per Week/7 Days Discharge Instructions: Use triamcinolone mixed with lotion, in clinic only Peri-Wound Care: Sween Lotion (Moisturizing lotion) ([place]) 1 x Per Week/7 Days Discharge Instructions: Apply moisturizing lotion as directed Prim Dressing: KerraCel Ag Gelling Fiber Dressing, 4x5 in (silver alginate) (Home Health) 1 x Per Week/7 Days ary Discharge Instructions: Apply silver alginate to wound bed as instructed Secondary Dressing: Woven Gauze  Sponge, Non-Sterile 4x4 in (Home Health) 1 x Per Week/7 Days Discharge Instructions: Apply over primary dressing as directed. Secondary Dressing: ALLEVYN Heel 4 1/2in x 5 1/2in / 10.5cm x 13.5cm (Home Health) 1 x Per Week/7 Days Discharge Instructions: Apply over primary dressing as directed. Com pression Wrap: FourPress (4 layer compression wrap) 1 x Per Week/7 Days Discharge Instructions: Apply four layer compression as directed. May also use Miliken CoFlex 2 layer compression system as alternative. 1. The patient has a punched-out area on the left plantar heel. This does not have the appearance of a pressure ulcer although I do not really have another good explanation. After debridement I did a culture of this area because of the surrounding erythema. She will definitely need an x-ray to look at the underlying calcaneus and exclude a foreign body. 2. I gave her empiric doxycycline 100 p.o. twice daily for 7 days 3. She has severe lymphedema left greater than right. We use silver alginate on all the weeping areas TCA and moisturizer on her skin, Zetuvit, ABDs under 4- layer compression bilaterally. We will put a heel cup on the left heel 4. She has [place] healthcare which does not provide a high likelihood of being able to get home health nevertheless we will try. If we are not successful she is going to  need to come out weekly to our clinic from [place]. I have asked her to call us if the weeping goes through her current dressings. 5. Perhaps the larger issue is that she has not been compliant with stockings although she said she has them. It may not be impossible to get her legs to heal but maintenance of skin integrity in both legs is going to be challenging unless she is compliant. I spent 45 minutes in review of the patient's past medical history, face-to-face evaluation and preparation of this record Electronic Signature(s) Signed: 01/27/2021 5:13:41 PM By: Linton Ham MD Entered By:  Linton Ham on 01/27/2021 17:11:58 -------------------------------------------------------------------------------- HxROS Details Patient Name: Date of Service: [name], [name] RES H. 01/27/2021 2:45 PM Medical Record Number: [id number] Patient Account Number: [id number] Date of Birth/Sex: Treating RN: 24-Oct-1950 (70 y.o. Female) Deon Pilling Primary Care Provider: Leslie Andrea Other Clinician: Referring Provider: Treating Provider/Extender: Oretha Ellis in Treatment: 0 Information Obtained From Patient Constitutional Symptoms (General Health) Complaints and Symptoms: Negative for: Fatigue; Fever; Chills; Marked Weight Change Medical History: Past Medical History Notes: myasthenia gravis Eyes Complaints and Symptoms: Positive for: Glasses / Contacts - glasses Negative for: Dry Eyes; Vision Changes Medical History: Positive for: Cataracts - bilateral removed Negative for: Glaucoma; Optic Neuritis Ear/Nose/Mouth/Throat Complaints and Symptoms: Negative for: Chronic sinus problems or rhinitis Medical History: Negative for: Chronic sinus problems/congestion; Middle ear problems Respiratory Complaints and Symptoms: Negative for: Chronic or frequent coughs; Shortness of Breath Medical History: Positive for: Asthma; Sleep Apnea - CPAP 02 at night Negative for: Aspiration; Chronic Obstructive Pulmonary Disease (COPD); Pneumothorax; Tuberculosis Past Medical History Notes: obesity hypoventilation syndrome Cardiovascular Complaints and Symptoms: Negative for: Chest pain Medical History: Positive for: Hypertension Negative for: Angina; Arrhythmia; Congestive Heart Failure; Coronary Artery Disease; Deep Vein Thrombosis; Hypotension; Myocardial Infarction; Peripheral Arterial Disease; Peripheral Venous Disease; Phlebitis; Vasculitis Past Medical History Notes: hyperlipidemia Gastrointestinal Complaints and Symptoms: Negative for: Frequent  diarrhea; Nausea; Vomiting Medical History: Negative for: Cirrhosis ; Colitis; Crohns; Hepatitis A; Hepatitis B; Hepatitis C Past Medical History Notes: GERD Endocrine Complaints and Symptoms: Negative for: Heat/cold intolerance Medical History: Negative for: Type I Diabetes; Type II Diabetes Past Medical History Notes: check blood glucose for a period of time related to long term use of steroids x6 months. Genitourinary Complaints and Symptoms: Negative for: Frequent urination Medical History: Negative for: End Stage Renal Disease Musculoskeletal Complaints and Symptoms: Negative for: Muscle Pain; Muscle Weakness Medical History: Positive for: Gout; Osteoarthritis Negative for: Rheumatoid Arthritis; Osteomyelitis Neurologic Complaints and Symptoms: Negative for: Numbness/parasthesias Medical History: Negative for: Dementia; Neuropathy; Quadriplegia; Paraplegia; Seizure Disorder Psychiatric Complaints and Symptoms: Negative for: Claustrophobia; Suicidal Medical History: Negative for: Anorexia/bulimia; Confinement Anxiety Hematologic/Lymphatic Medical History: Positive for: Lymphedema - bilateral legs Negative for: Anemia; Hemophilia; Human Immunodeficiency Virus; Sickle Cell Disease Immunological Medical History: Negative for: Lupus Erythematosus; Raynauds; Scleroderma Oncologic Medical History: Negative for: Received Chemotherapy; Received Radiation HBO Extended History Items Eyes: Cataracts Immunizations Pneumococcal Vaccine: Received Pneumococcal Vaccination: No Implantable Devices No devices added Family and Social History Cancer: No; Diabetes: Yes - Siblings; Heart Disease: Yes - Mother,Father; Hereditary Spherocytosis: No; Hypertension: No; Kidney Disease: No; Lung Disease: Yes - Father; Seizures: No; Stroke: No; Thyroid Problems: Yes - Mother; Tuberculosis: No; Former smoker - quit 2006; Marital Status - Widowed; Alcohol Use: Never; Drug Use: No History;  Caffeine Use: Never; Financial Concerns: No; Food, Clothing or Shelter Needs: No; Support System Lacking: No; Transportation Concerns: No Electronic Signature(s) Signed: 01/27/2021 5:13:41 PM By:  Linton Ham MD Signed: 01/27/2021 6:50:03 PM By: Deon Pilling Entered By: Deon Pilling on 01/27/2021 16:06:12 -------------------------------------------------------------------------------- SuperBill Details Patient Name: Date of Service: [name], [name] RES H. 01/27/2021 Medical Record Number: [id number] Patient Account Number: [id number] Date of Birth/Sex: Treating RN: Mar 17, 1951 (70 y.o. Female) Levan Hurst Primary Care Provider: Leslie Andrea Other Clinician: Referring Provider: Treating Provider/Extender: Oretha Ellis in Treatment: 0 Diagnosis Coding ICD-10 Codes Code Description [phone] Pressure ulcer of left heel, unstageable I89.0 Lymphedema, not elsewhere classified L97.828 Non-pressure chronic ulcer of other part of left lower leg with other specified severity L97.811 Non-pressure chronic ulcer of other part of right lower leg limited to breakdown of skin Facility Procedures CPT4 Code: [id number] Description: [place] VISIT-LEV 3 EST PT Modifier: 25 Quantity: 1 CPT4 Code: [id number] Description: 56153 - DEB SUBQ TISSUE 20 SQ CM/< ICD-10 Diagnosis Description L89.620 Pressure ulcer of left heel, unstageable Modifier: Quantity: 1 Physician Procedures : CPT4 Code Description Modifier [id number] - WC PHYS LEVEL 4 - NEW PT 25 ICD-10 Diagnosis Description L89.620 Pressure ulcer of left heel, unstageable I89.0 Lymphedema, not elsewhere classified L97.828 Non-pressure chronic ulcer of other part of left  lower leg with other specified severity L97.811 Non-pressure chronic ulcer of other part of right lower leg limited to breakdown of skin Quantity: 1 : [id number] - WC PHYS SUBQ TISS 20 SQ CM ICD-10 Diagnosis Description L89.620 Pressure  ulcer of left heel, unstageable Quantity: 1 Electronic Signature(s) Signed: 01/27/2021 7:11:13 PM By: Levan Hurst RN, BSN Signed: 01/28/2021 8:35:52 PM By: Linton Ham MD Previous Signature: 01/27/2021 5:13:41 PM Version By: Linton Ham MD Entered By: Levan Hurst on 01/27/2021 18:33:55

## 2021-02-01 LAB — AEROBIC CULTURE W GRAM STAIN (SUPERFICIAL SPECIMEN): Gram Stain: NONE SEEN

## 2021-02-04 ENCOUNTER — Encounter (HOSPITAL_BASED_OUTPATIENT_CLINIC_OR_DEPARTMENT_OTHER): Payer: Medicare Other | Admitting: Internal Medicine

## 2021-02-04 ENCOUNTER — Other Ambulatory Visit: Payer: Self-pay

## 2021-02-04 DIAGNOSIS — L89620 Pressure ulcer of left heel, unstageable: Secondary | ICD-10-CM | POA: Diagnosis not present

## 2021-02-04 NOTE — Progress Notes (Signed)
[name], IA LEEB ([id number]) Visit Report for 02/04/2021 HPI Details Patient Name: Date of Service: [name], [name] RES H. 02/04/2021 10:45 A M Medical Record Number: [id number] Patient Account Number: [id number] Date of Birth/Sex: Treating RN: 1950-10-09 (70 y.o. [name] Primary Care Provider: Leslie [name] Other Clinician: Referring Provider: Treating Provider/Extender: [name] in Treatment: 1 History of Present Illness HPI Description: ADMISSION 01/27/2021 This is a 70 year old woman who lives in [place]. She came down here by public transportation I believe. She had been followed up to the beginning of last month at the therapy wound care clinic in [place] however she was dismissed because of noncompliance with recommendations which includes getting family members to change her dressing, washing dressings and reapplying them etc. Very clear from the tone of their last note that they were really quite frustrated. She is listed as having lymphedema in her problem list. She comes in today with wraps involving her feet that went up just above her ankles. She has a punched-out wound on the left plantar heel which is erythematous and painful. She has circumferential skin breakdown on the left lower leg with weeping edema fluid. More dry flaking skin on the right leg with at least 2 areas that are weeping edema fluid. These would not be well covered by the dressings that she had put on. Past medical history includes bilateral lymphedema, venous stasis, ocular myasthenia, hypertension, hyperlipidemia, obesity hypoventilation syndrome with obstructive sleep apnea. She uses CPAP at night. Notable that she had venous reflux studies done at [facility] on 09/28/2020. There was no evidence of DVT from the common femoral through the popliteal veins no evidence of superficial venous thrombosis. On the right she had reflux in the right greater  saphenous vein on the left venous reflux at the left saphenofemoral junction as well as venous reflux in the left greater saphenous vein at the knee and in the calf there was also venous reflux in the left short saphenous vein. At this point I have not reviewed her follow-up with vascular if she had any ABIs in our clinic were 0.83 on the right 0.85 on the left 7/14; admitted this patient to the clinic last week. She was advertises having lymphedema and circumferential wounds on her left greater than right leg indeed she had all of this however in addition she also had a punched-out area on the mid plantar heel. Completely necrotic surface. Culture I did of this showed Serratia. 2 days ago I change the antibiotic from the empiric doxycycline I gave her to Cipro. Encouraged to hold her trazodone while she was getting Cipro. An x-ray of the heel was negative for osteomyelitis, foreign body etc. Electronic Signature(s) Signed: 02/04/2021 4:30:53 PM By: [name] Ham MD Entered By: [name] on 02/04/2021 11:52:36 -------------------------------------------------------------------------------- Physical Exam Details Patient Name: Date of Service: [name], [name] RES H. 02/04/2021 10:45 A M Medical Record Number: [id number] Patient Account Number: [id number] Date of Birth/Sex: Treating RN: 08-Sep-1950 (70 y.o. [name] Primary Care Provider: Leslie [name] Other Clinician: Referring Provider: Treating Provider/Extender: [name] in Treatment: 1 Constitutional Sitting or standing Blood Pressure is within target range for patient.. Pulse regular and within target range for patient.Marland Kitchen Respirations regular, non-labored and within target range.. Temperature is normal and within the target range for the patient.Marland Kitchen Appears in no distress. Notes Wound examthe most problematic area is a left plantar heel still with depth and the necrotic surface. Perhaps not quite  as  tender as last week. No purulent drainage and less erythema around the wound. Quite a bit of improvement in the amount of edema in both legs. Still has a very superficial area on the right anterior lower leg and still some weeping areas on the left anterior mid tibia and lateral lower leg but overall this is a lot better Electronic Signature(s) Signed: 02/04/2021 4:30:53 PM By: [name] Ham MD Entered By: [name] on 02/04/2021 11:55:57 -------------------------------------------------------------------------------- Physician Orders Details Patient Name: Date of Service: [name], [name] RES H. 02/04/2021 10:45 A M Medical Record Number: [id number] Patient Account Number: [id number] Date of Birth/Sex: Treating RN: 12/21/1950 (70 y.o. [name], [name] Primary Care Provider: Leslie [name] Other Clinician: Referring Provider: Treating Provider/Extender: [name] in Treatment: 1 Verbal / Phone Orders: No Diagnosis Coding ICD-10 Coding Code Description [phone] Pressure ulcer of left heel, unstageable I89.0 Lymphedema, not elsewhere classified L97.828 Non-pressure chronic ulcer of other part of left lower leg with other specified severity L97.811 Non-pressure chronic ulcer of other part of right lower leg limited to breakdown of skin Follow-up Appointments ppointment in 1 week. - with Dr. Dellia Nims Return A Bathing/ Shower/ Hygiene May shower with protection but do not get wound dressing(s) wet. - Use cast protector - can buy at [facility], [facility], or [employer] Edema Control - Lymphedema / SCD / Other Elevate legs to the level of the heart or above for 30 minutes daily and/or when sitting, a frequency of: - throughout the day Avoid standing for long periods of time. Exercise regularly Off-Loading Wound #3 Left Calcaneus Wedge [name] to: - offloading heel sandal. wear while walking and standing. no pressure to left heel. Home Health New wound care orders  this week; continue Home Health for wound care. May utilize formulary equivalent dressing for wound treatment orders unless otherwise specified. - Home Health ***Please wrap compression wraps from base of toes to just below the knees.*** Wound Treatment Wound #1 - Lower Leg Wound Laterality: Right, Anterior Cleanser: Soap and Water (Home Health) 1 x Per Week/7 Days Discharge Instructions: May shower and wash wound with dial antibacterial soap and water prior to dressing change. Cleanser: Wound Cleanser (Home Health) 1 x Per Week/7 Days Discharge Instructions: Cleanse the wound with wound cleanser or normal saline prior to applying a clean dressing using gauze sponges, not tissue or cotton balls. Peri-Wound Care: Triamcinolone 15 (g) 1 x Per Week/7 Days Discharge Instructions: Use triamcinolone mixed with lotion, in clinic only Peri-Wound Care: Sween Lotion (Moisturizing lotion) ([place]) 1 x Per Week/7 Days Discharge Instructions: Apply moisturizing lotion as directed Prim Dressing: KerraCel Ag Gelling Fiber Dressing, 4x5 in (silver alginate) (Home Health) 1 x Per Week/7 Days ary Discharge Instructions: Apply silver alginate to wound bed as instructed Secondary Dressing: ABD Pad, 8x10 (Home Health) 1 x Per Week/7 Days Discharge Instructions: Apply over primary dressing as directed. Secondary Dressing: Zetuvit Plus 4x8 in (Home Health) 1 x Per Week/7 Days Discharge Instructions: or other super absorbent dressing Compression Wrap: FourPress (4 layer compression wrap) 1 x Per Week/7 Days Discharge Instructions: Apply four layer compression as directed. May also use Miliken CoFlex 2 layer compression system as alternative. Wound #2 - Lower Leg Wound Laterality: Left, Circumferential Cleanser: Soap and Water (Home Health) 1 x Per Week/7 Days Discharge Instructions: May shower and wash wound with dial antibacterial soap and water prior to dressing change. Cleanser: Wound Cleanser (Home  Health) 1 x Per Week/7 Days Discharge Instructions: Cleanse the wound with wound cleanser  or normal saline prior to applying a clean dressing using gauze sponges, not tissue or cotton balls. Peri-Wound Care: Triamcinolone 15 (g) 1 x Per Week/7 Days Discharge Instructions: Use triamcinolone mixed with lotion, in clinic only Peri-Wound Care: Sween Lotion (Moisturizing lotion) ([place]) 1 x Per Week/7 Days Discharge Instructions: Apply moisturizing lotion as directed Prim Dressing: KerraCel Ag Gelling Fiber Dressing, 4x5 in (silver alginate) (Home Health) 1 x Per Week/7 Days ary Discharge Instructions: Apply silver alginate to wound bed as instructed Secondary Dressing: ABD Pad, 8x10 (Home Health) 1 x Per Week/7 Days Discharge Instructions: Apply over primary dressing as directed. Secondary Dressing: Zetuvit Plus 4x8 in (Home Health) 1 x Per Week/7 Days Discharge Instructions: or other super absorbent dressing Compression Wrap: FourPress (4 layer compression wrap) 1 x Per Week/7 Days Discharge Instructions: Apply four layer compression as directed. May also use Miliken CoFlex 2 layer compression system as alternative. Wound #3 - Calcaneus Wound Laterality: Left Cleanser: Soap and Water (Home Health) 1 x Per Week/7 Days Discharge Instructions: May shower and wash wound with dial antibacterial soap and water prior to dressing change. Cleanser: Wound Cleanser (Home Health) 1 x Per Week/7 Days Discharge Instructions: Cleanse the wound with wound cleanser or normal saline prior to applying a clean dressing using gauze sponges, not tissue or cotton balls. Peri-Wound Care: Triamcinolone 15 (g) 1 x Per Week/7 Days Discharge Instructions: Use triamcinolone mixed with lotion, in clinic only Peri-Wound Care: Sween Lotion (Moisturizing lotion) ([place]) 1 x Per Week/7 Days Discharge Instructions: Apply moisturizing lotion as directed Prim Dressing: KerraCel Ag Gelling Fiber Dressing, 4x5 in  (silver alginate) (Home Health) 1 x Per Week/7 Days ary Discharge Instructions: Apply silver alginate to wound bed as instructed Secondary Dressing: Woven Gauze Sponge, Non-Sterile 4x4 in (Home Health) 1 x Per Week/7 Days Discharge Instructions: Apply over primary dressing as directed. Secondary Dressing: ALLEVYN Heel 4 1/2in x 5 1/2in / 10.5cm x 13.5cm (Home Health) 1 x Per Week/7 Days Discharge Instructions: Apply over primary dressing as directed. Compression Wrap: FourPress (4 layer compression wrap) 1 x Per Week/7 Days Discharge Instructions: Apply four layer compression as directed. May also use Miliken CoFlex 2 layer compression system as alternative. Electronic Signature(s) Signed: 02/04/2021 4:30:53 PM By: [name] Ham MD Signed: 02/04/2021 6:14:19 PM By: Deon Pilling Entered By: Deon Pilling on 02/04/2021 11:31:08 -------------------------------------------------------------------------------- Problem List Details Patient Name: Date of Service: [place]tinger, [name] RES H. 02/04/2021 10:45 A M Medical Record Number: [id number] Patient Account Number: [id number] Date of Birth/Sex: Treating RN: 1950-08-22 (70 y.o. [name], [name] Primary Care Provider: Leslie [name] Other Clinician: Referring Provider: Treating Provider/Extender: [name] in Treatment: 1 Active Problems ICD-10 Encounter Code Description Active Date MDM Diagnosis L89.620 Pressure ulcer of left heel, unstageable 01/27/2021 No Yes I89.0 Lymphedema, not elsewhere classified 01/27/2021 No Yes L97.828 Non-pressure chronic ulcer of other part of left lower leg with other specified 01/27/2021 No Yes severity L97.811 Non-pressure chronic ulcer of other part of right lower leg limited to breakdown 01/27/2021 No Yes of skin Inactive Problems Resolved Problems Electronic Signature(s) Signed: 02/04/2021 4:30:53 PM By: [name] Ham MD Entered By: [name] on 02/04/2021  11:50:50 -------------------------------------------------------------------------------- Progress Note Details Patient Name: Date of Service: Mcferran, [name] RES H. 02/04/2021 10:45 A M Medical Record Number: [id number] Patient Account Number: [id number] Date of Birth/Sex: Treating RN: July 10, 1951 (70 y.o. [name] Primary Care Provider: Leslie [name] Other Clinician: Referring Provider: Treating Provider/Extender: [name] in Treatment: 1  Subjective History of Present Illness (HPI) ADMISSION 01/27/2021 This is a 70 year old woman who lives in [place]. She came down here by public transportation I believe. She had been followed up to the beginning of last month at the therapy wound care clinic in [place] however she was dismissed because of noncompliance with recommendations which includes getting family members to change her dressing, washing dressings and reapplying them etc. Very clear from the tone of their last note that they were really quite frustrated. She is listed as having lymphedema in her problem list. She comes in today with wraps involving her feet that went up just above her ankles. She has a punched-out wound on the left plantar heel which is erythematous and painful. She has circumferential skin breakdown on the left lower leg with weeping edema fluid. More dry flaking skin on the right leg with at least 2 areas that are weeping edema fluid. These would not be well covered by the dressings that she had put on. Past medical history includes bilateral lymphedema, venous stasis, ocular myasthenia, hypertension, hyperlipidemia, obesity hypoventilation syndrome with obstructive sleep apnea. She uses CPAP at night. Notable that she had venous reflux studies done at [facility] on 09/28/2020. There was no evidence of DVT from the common femoral through the popliteal veins no evidence of superficial venous thrombosis. On the  right she had reflux in the right greater saphenous vein on the left venous reflux at the left saphenofemoral junction as well as venous reflux in the left greater saphenous vein at the knee and in the calf there was also venous reflux in the left short saphenous vein. At this point I have not reviewed her follow-up with vascular if she had any ABIs in our clinic were 0.83 on the right 0.85 on the left 7/14; admitted this patient to the clinic last week. She was advertises having lymphedema and circumferential wounds on her left greater than right leg indeed she had all of this however in addition she also had a punched-out area on the mid plantar heel. Completely necrotic surface. Culture I did of this showed Serratia. 2 days ago I change the antibiotic from the empiric doxycycline I gave her to Cipro. Encouraged to hold her trazodone while she was getting Cipro. An x-ray of the heel was negative for osteomyelitis, foreign body etc. Objective Constitutional Sitting or standing Blood Pressure is within target range for patient.. Pulse regular and within target range for patient.Marland Kitchen Respirations regular, non-labored and within target range.. Temperature is normal and within the target range for the patient.Marland Kitchen Appears in no distress. Vitals Time Taken: 10:55 AM, Height: 65 in, Weight: 239 lbs, BMI: 39.8, Temperature: 97.4 F, Pulse: 78 bpm, Respiratory Rate: 18 breaths/min, Blood Pressure: 138/76 mmHg. General Notes: Wound examoothe most problematic area is a left plantar heel still with depth and the necrotic surface. Perhaps not quite as tender as last week. No purulent drainage and less erythema around the wound. oo Quite a bit of improvement in the amount of edema in both legs. Still has a very superficial area on the right anterior lower leg and still some weeping areas on the left anterior mid tibia and lateral lower leg but overall this is a lot better Integumentary (Hair, Skin) Wound #1  status is Open. Original cause of wound was Blister. The date acquired was: 07/25/2020. The wound has been in treatment 1 weeks. The wound is located on the Right,Anterior Lower Leg. The wound measures 1.5cm length x 0.5cm  width x 0.1cm depth; 0.589cm^2 area and 0.059cm^3 volume. There is Fat Layer (Subcutaneous Tissue) exposed. There is no tunneling or undermining noted. There is a medium amount of serous drainage noted. The wound margin is distinct with the outline attached to the wound base. There is large (67-100%) pink granulation within the wound bed. There is no necrotic tissue within the wound bed. Wound #2 status is Open. Original cause of wound was Gradually Appeared. The date acquired was: 07/25/2020. The wound has been in treatment 1 weeks. The wound is located on the Left,Circumferential Lower Leg. The wound measures 12.5cm length x 37cm width x 0.1cm depth; 363.247cm^2 area and 36.325cm^3 volume. There is Fat Layer (Subcutaneous Tissue) exposed. There is no tunneling or undermining noted. There is a large amount of serosanguineous drainage noted. The wound margin is distinct with the outline attached to the wound base. There is large (67-100%) pink, pale granulation within the wound bed. There is no necrotic tissue within the wound bed. Wound #3 status is Open. Original cause of wound was Not Known. The date acquired was: 12/28/2020. The wound has been in treatment 1 weeks. The wound is located on the Left Calcaneus. The wound measures 1.4cm length x 1.6cm width x 1.2cm depth; 1.759cm^2 area and 2.111cm^3 volume. There is no tunneling or undermining noted. There is a medium amount of serosanguineous drainage noted. The wound margin is distinct with the outline attached to the wound base. There is no granulation within the wound bed. There is a large (67-100%) amount of necrotic tissue within the wound bed including Eschar and Adherent Slough. Assessment Active Problems ICD-10 Pressure ulcer  of left heel, unstageable Lymphedema, not elsewhere classified Non-pressure chronic ulcer of other part of left lower leg with other specified severity Non-pressure chronic ulcer of other part of right lower leg limited to breakdown of skin Procedures Wound #1 Pre-procedure diagnosis of Wound #1 is a Lymphedema located on the Right,Anterior Lower Leg . There was a Four Layer Compression Therapy Procedure by Deon Pilling, RN. Post procedure Diagnosis Wound #1: Same as Pre-Procedure Wound #2 Pre-procedure diagnosis of Wound #2 is a Lymphedema located on the Left,Circumferential Lower Leg . There was a Four Layer Compression Therapy Procedure by Deon Pilling, RN. Post procedure Diagnosis Wound #2: Same as Pre-Procedure Wound #3 Pre-procedure diagnosis of Wound #3 is a Pressure Ulcer located on the Left Calcaneus . There was a Four Layer Compression Therapy Procedure by Deon Pilling, RN. Post procedure Diagnosis Wound #3: Same as Pre-Procedure Plan Follow-up Appointments: Return Appointment in 1 week. - with Dr. Arcola Jansky Shower/ Hygiene: May shower with protection but do not get wound dressing(s) wet. - Use cast protector - can buy at [facility], [facility], or [employer] Edema Control - Lymphedema / SCD / Other: Elevate legs to the level of the heart or above for 30 minutes daily and/or when sitting, a frequency of: - throughout the day Avoid standing for long periods of time. Exercise regularly Off-Loading: Wound #3 Left Calcaneus: Wedge [name] to: - offloading heel sandal. wear while walking and standing. no pressure to left heel. Home Health: New wound care orders this week; continue Home Health for wound care. May utilize formulary equivalent dressing for wound treatment orders unless otherwise specified. - Home Health ***Please wrap compression wraps from base of toes to just below the knees.*** WOUND #1: - Lower Leg Wound Laterality: Right, Anterior Cleanser: Soap and Water (Home  Health) 1 x Per Week/7 Days Discharge Instructions: May shower and wash  wound with dial antibacterial soap and water prior to dressing change. Cleanser: Wound Cleanser (Home Health) 1 x Per Week/7 Days Discharge Instructions: Cleanse the wound with wound cleanser or normal saline prior to applying a clean dressing using gauze sponges, not tissue or cotton balls. Peri-Wound Care: Triamcinolone 15 (g) 1 x Per Week/7 Days Discharge Instructions: Use triamcinolone mixed with lotion, in clinic only Peri-Wound Care: Sween Lotion (Moisturizing lotion) ([place]) 1 x Per Week/7 Days Discharge Instructions: Apply moisturizing lotion as directed Prim Dressing: KerraCel Ag Gelling Fiber Dressing, 4x5 in (silver alginate) (Home Health) 1 x Per Week/7 Days ary Discharge Instructions: Apply silver alginate to wound bed as instructed Secondary Dressing: ABD Pad, 8x10 (Home Health) 1 x Per Week/7 Days Discharge Instructions: Apply over primary dressing as directed. Secondary Dressing: Zetuvit Plus 4x8 in (Home Health) 1 x Per Week/7 Days Discharge Instructions: or other super absorbent dressing Com pression Wrap: FourPress (4 layer compression wrap) 1 x Per Week/7 Days Discharge Instructions: Apply four layer compression as directed. May also use Miliken CoFlex 2 layer compression system as alternative. WOUND #2: - Lower Leg Wound Laterality: Left, Circumferential Cleanser: Soap and Water (Home Health) 1 x Per Week/7 Days Discharge Instructions: May shower and wash wound with dial antibacterial soap and water prior to dressing change. Cleanser: Wound Cleanser (Home Health) 1 x Per Week/7 Days Discharge Instructions: Cleanse the wound with wound cleanser or normal saline prior to applying a clean dressing using gauze sponges, not tissue or cotton balls. Peri-Wound Care: Triamcinolone 15 (g) 1 x Per Week/7 Days Discharge Instructions: Use triamcinolone mixed with lotion, in clinic only Peri-Wound Care:  Sween Lotion (Moisturizing lotion) ([place]) 1 x Per Week/7 Days Discharge Instructions: Apply moisturizing lotion as directed Prim Dressing: KerraCel Ag Gelling Fiber Dressing, 4x5 in (silver alginate) (Home Health) 1 x Per Week/7 Days ary Discharge Instructions: Apply silver alginate to wound bed as instructed Secondary Dressing: ABD Pad, 8x10 (Home Health) 1 x Per Week/7 Days Discharge Instructions: Apply over primary dressing as directed. Secondary Dressing: Zetuvit Plus 4x8 in (Home Health) 1 x Per Week/7 Days Discharge Instructions: or other super absorbent dressing Com pression Wrap: FourPress (4 layer compression wrap) 1 x Per Week/7 Days Discharge Instructions: Apply four layer compression as directed. May also use Miliken CoFlex 2 layer compression system as alternative. WOUND #3: - Calcaneus Wound Laterality: Left Cleanser: Soap and Water (Home Health) 1 x Per Week/7 Days Discharge Instructions: May shower and wash wound with dial antibacterial soap and water prior to dressing change. Cleanser: Wound Cleanser (Home Health) 1 x Per Week/7 Days Discharge Instructions: Cleanse the wound with wound cleanser or normal saline prior to applying a clean dressing using gauze sponges, not tissue or cotton balls. Peri-Wound Care: Triamcinolone 15 (g) 1 x Per Week/7 Days Discharge Instructions: Use triamcinolone mixed with lotion, in clinic only Peri-Wound Care: Sween Lotion (Moisturizing lotion) ([place]) 1 x Per Week/7 Days Discharge Instructions: Apply moisturizing lotion as directed Prim Dressing: KerraCel Ag Gelling Fiber Dressing, 4x5 in (silver alginate) (Home Health) 1 x Per Week/7 Days ary Discharge Instructions: Apply silver alginate to wound bed as instructed Secondary Dressing: Woven Gauze Sponge, Non-Sterile 4x4 in (Home Health) 1 x Per Week/7 Days Discharge Instructions: Apply over primary dressing as directed. Secondary Dressing: ALLEVYN Heel 4 1/2in x 5 1/2in / 10.5cm  x 13.5cm (Home Health) 1 x Per Week/7 Days Discharge Instructions: Apply over primary dressing as directed. Com pression Wrap: FourPress (4 layer compression wrap)  1 x Per Week/7 Days Discharge Instructions: Apply four layer compression as directed. May also use Miliken CoFlex 2 layer compression system as alternative. 1. I am continue with the silver alginate to the left calcaneus. I did not debride this today because of the underlying infection I would like her to continue antibiotics in fact I may need to continue antibiotics beyond 1 week I will see next week when I see her. 2. I am going to offload the left heel using a heel offloading boot. Hopefully her gait will stand this. She uses a walker 3. I think the right leg should be healed by next week and I have asked her to bring in her stocking although I have very little information on who is available to help her should that help be necessary. 4. On the left leg things are going quite well and again the wounds are a lot better here the edema is under better control. I am uncertain what the options are going forward. She was discharged from the wound care center in [place] where she lives Electronic Signature(s) Signed: 02/04/2021 4:30:53 PM By: [name] Ham MD Entered By: [name] on 02/04/2021 11:57:37 -------------------------------------------------------------------------------- SuperBill Details Patient Name: Date of Service: Mole, [name] RES H. 02/04/2021 Medical Record Number: [id number] Patient Account Number: [id number] Date of Birth/Sex: Treating RN: 1951-06-20 (70 y.o. [name], [name] Primary Care Provider: Leslie [name] Other Clinician: Referring Provider: Treating Provider/Extender: [name] in Treatment: 1 Diagnosis Coding ICD-10 Codes Code Description [phone] Pressure ulcer of left heel, unstageable I89.0 Lymphedema, not elsewhere classified L97.828 Non-pressure  chronic ulcer of other part of left lower leg with other specified severity L97.811 Non-pressure chronic ulcer of other part of right lower leg limited to breakdown of skin Facility Procedures CPT4: Code [id number] Description: 81 BILATERAL: Application of multi-layer venous compression system; leg (below knee), including ankle and t. Modifier: Quantity: 1 Physician Procedures : CPT4 Code Description Modifier [id number] - WC PHYS LEVEL 4 - EST PT ICD-10 Diagnosis Description L89.620 Pressure ulcer of left heel, unstageable I89.0 Lymphedema, not elsewhere classified L97.828 Non-pressure chronic ulcer of other part of left  lower leg with other specified severity L97.811 Non-pressure chronic ulcer of other part of right lower leg limited to breakdown of skin Quantity: 1 Electronic Signature(s) Signed: 02/04/2021 4:30:53 PM By: [name] Ham MD Signed: 02/04/2021 6:14:19 PM By: Deon Pilling Entered By: Deon Pilling on 02/04/2021 12:53:27

## 2021-02-04 NOTE — Progress Notes (Signed)
DORIANA, MAZURKIEWICZ ([id number]) Visit Report for 02/04/2021 Arrival Information Details Patient Name: Date of Service: KARTER, HAIRE RES H. 02/04/2021 10:45 A M Medical Record Number: [id number] Patient Account Number: [id number] Date of Birth/Sex: Treating RN: 1950-10-08 (70 y.o. Nancy Fetter Primary Care Daveena Elmore: Leslie Andrea Other Clinician: Referring Eliga Arvie: Treating Tyden Kann/Extender: Oretha Ellis in Treatment: 1 Visit Information History Since Last Visit Added or deleted any medications: No Patient Arrived: Wheel Chair Any new allergies or adverse reactions: No Arrival Time: 10:54 Had a fall or experienced change in No Accompanied By: alone activities of daily living that may affect Transfer Assistance: None risk of falls: Patient Identification Verified: Yes Signs or symptoms of abuse/neglect since last visito No Secondary Verification Process Completed: Yes Hospitalized since last visit: No Patient Requires Transmission-Based Precautions: No Implantable device outside of the clinic excluding No Patient Has Alerts: No cellular tissue based products placed in the center since last visit: Has Dressing in Place as Prescribed: Yes Has Compression in Place as Prescribed: Yes Pain Present Now: Yes Electronic Signature(s) Signed: 02/04/2021 6:01:10 PM By: Levan Hurst RN, BSN Entered By: Levan Hurst on 02/04/2021 10:55:13 -------------------------------------------------------------------------------- Compression Therapy Details Patient Name: Date of Service: Forbess, [name] 02/04/2021 10:45 A M Medical Record Number: [id number] Patient Account Number: [id number] Date of Birth/Sex: Treating RN: 1951/07/05 (70 y.o. [name] Primary Care Jamilla Galli: Leslie Andrea Other Clinician: Referring Giada Schoppe: Treating Temitope Flammer/Extender: Oretha Ellis in Treatment: 1 Compression Therapy Performed for  Wound Assessment: Wound #1 Right,Anterior Lower Leg Performed By: Clinician Deon Pilling, RN Compression Type: Four Layer Post Procedure Diagnosis Same as Pre-procedure Electronic Signature(s) Signed: 02/04/2021 6:14:19 PM By: Deon Pilling Entered By: Deon Pilling on 02/04/2021 11:26:34 -------------------------------------------------------------------------------- Compression Therapy Details Patient Name: Date of Service: Mignone, [name] 02/04/2021 10:45 A M Medical Record Number: [id number] Patient Account Number: [id number] Date of Birth/Sex: Treating RN: July 29, 1950 (70 y.o. [name] Primary Care Amanii Snethen: Leslie Andrea Other Clinician: Referring Nastasia Kage: Treating Malayia Spizzirri/Extender: Oretha Ellis in Treatment: 1 Compression Therapy Performed for Wound Assessment: Wound #2 Left,Circumferential Lower Leg Performed By: Clinician Deon Pilling, RN Compression Type: Four Layer Post Procedure Diagnosis Same as Pre-procedure Electronic Signature(s) Signed: 02/04/2021 6:14:19 PM By: Deon Pilling Entered By: Deon Pilling on 02/04/2021 11:26:34 -------------------------------------------------------------------------------- Compression Therapy Details Patient Name: Date of Service: Hehir, [name] 02/04/2021 10:45 A M Medical Record Number: [id number] Patient Account Number: [id number] Date of Birth/Sex: Treating RN: [birth year] (70 y.o. [name] Primary Care Talyn Dessert: Leslie Andrea Other Clinician: Referring Abrey Bradway: Treating Velvie Thomaston/Extender: Oretha Ellis in Treatment: 1 Compression Therapy Performed for Wound Assessment: Wound #3 Left Calcaneus Performed By: Clinician Deon Pilling, RN Compression Type: Four Layer Post Procedure Diagnosis Same as Pre-procedure Electronic Signature(s) Signed: 02/04/2021 6:14:19 PM By: Deon Pilling Entered By: Deon Pilling on 02/04/2021  11:26:34 -------------------------------------------------------------------------------- Lower Extremity Assessment Details Patient Name: Date of Service: Suppa, [name] 02/04/2021 10:45 A M Medical Record Number: [id number] Patient Account Number: [id number] Date of Birth/Sex: Treating RN: [birth year] (70 y.o. Nancy Fetter Primary Care Yousof Alderman: Leslie Andrea Other Clinician: Referring Alexas Basulto: Treating Branon Sabine/Extender: Oretha Ellis in Treatment: 1 Edema Assessment Assessed: Shirlyn Goltz: No] Patrice Paradise: No] Edema: [Left: Yes] [Right: Yes] Calf Left: Right: Point of Measurement: 30 cm From Medial Instep 46 cm 39 cm Ankle Left: Right: Point of Measurement: 11 cm From Medial Instep 38.5 cm 35 cm Vascular Assessment Pulses: Dorsalis Pedis Palpable: [Left:Yes] [Right:Yes]  Electronic Signature(s) Signed: 02/04/2021 6:01:10 PM By: Levan Hurst RN, BSN Entered By: Levan Hurst on 02/04/2021 11:03:14 -------------------------------------------------------------------------------- Multi Wound Chart Details Patient Name: Date of Service: Pecina, [name] 02/04/2021 10:45 A M Medical Record Number: [id number] Patient Account Number: [id number] Date of Birth/Sex: Treating RN: Dec 26, 1950 (70 y.o. [name], Meta.Reding Primary Care Edwyna Dangerfield: Leslie Andrea Other Clinician: Referring Ayvion Kavanagh: Treating Parag Dorton/Extender: Oretha Ellis in Treatment: 1 Vital Signs Height(in): 17 Pulse(bpm): 71 Weight(lbs): 53 Blood Pressure(mmHg): 138/76 Body Mass Index(BMI): 40 Temperature(F): 97.4 Respiratory Rate(breaths/min): 18 Photos: [1:No Photos Right, Anterior Lower Leg] [2:No Photos Left, Circumferential Lower Leg] [3:No Photos Left Calcaneus] Wound Location: [1:Blister] [2:Gradually Appeared] [3:Not Known] Wounding Event: [1:Lymphedema] [2:Lymphedema] [3:Pressure Ulcer] Primary Etiology: [1:Cataracts, Lymphedema,  Asthma,] [2:Cataracts, Lymphedema, Asthma,] [3:Cataracts, Lymphedema, Asthma,] Comorbid History: [1:Sleep Apnea, Hypertension, Gout, Osteoarthritis 07/25/2020] [2:Sleep Apnea, Hypertension, Gout, Osteoarthritis 07/25/2020] [3:Sleep Apnea, Hypertension, Gout, Osteoarthritis 12/28/2020] Date Acquired: [1:1] [2:1] [3:1] Weeks of Treatment: [1:Open] [2:Open] [3:Open] Wound Status: [1:1.5x0.5x0.1] [2:12.5x37x0.1] [3:1.4x1.6x1.2] Measurements L x W x D (cm) [1:0.589] [2:363.247] [3:1.759] A (cm) : rea [1:0.059] [2:36.325] [3:2.111] Volume (cm) : [1:92.90%] [2:20.90%] [3:-299.80%] % Reduction in Area: [1:92.90%] [2:20.90%] [3:-2298.90%] % Reduction in Volume: [1:Full Thickness Without Exposed] [2:Full Thickness Without Exposed] [3:Unstageable/Unclassified] Classification: [1:Support Structures Medium] [2:Support Structures Large] [3:Medium] Exudate A mount: [1:Serous] [2:Serosanguineous] [3:Serosanguineous] Exudate Type: [1:amber] [2:red, brown] [3:red, brown] Exudate Color: [1:Distinct, outline attached] [2:Distinct, outline attached] [3:Distinct, outline attached] Wound Margin: [1:Large (67-100%)] [2:Large (67-100%)] [3:None Present (0%)] Granulation Amount: [1:Pink] [2:Pink, Pale] [3:N/A] Granulation Quality: [1:None Present (0%)] [2:None Present (0%)] [3:Large (67-100%)] Necrotic Amount: [1:N/A] [2:N/A] [3:Eschar, Adherent Slough] Necrotic Tissue: [1:Fat Layer (Subcutaneous Tissue): Yes Fat Layer (Subcutaneous Tissue): Yes Fascia: No] Exposed Structures: [1:Fascia: No Tendon: No Muscle: No Joint: No Bone: No Small (1-33%)] [2:Fascia: No Tendon: No Muscle: No Joint: No Bone: No Small (1-33%)] [3:Fat Layer (Subcutaneous Tissue): No Tendon: No Muscle: No Joint: No Bone: No None] Epithelialization: [1:Compression Therapy] [2:Compression Therapy] [3:Compression Therapy] Treatment Notes Electronic Signature(s) Signed: 02/04/2021 4:30:53 PM By: Linton Ham MD Signed: 02/04/2021 6:14:19 PM By: Deon Pilling Entered By: Linton Ham on 02/04/2021 11:51:18 -------------------------------------------------------------------------------- Multi-Disciplinary Care Plan Details Patient Name: Date of Service: Scism, [name] 02/04/2021 10:45 A M Medical Record Number: [id number] Patient Account Number: [id number] Date of Birth/Sex: Treating RN: [birth year] (70 y.o. [name], [name] Primary Care Fina Heizer: Leslie Andrea Other Clinician: Referring Glade Strausser: Treating Maybel Dambrosio/Extender: Oretha Ellis in Treatment: 1 Multidisciplinary Care Plan reviewed with physician Active Inactive Abuse / Safety / Falls / Self Care Management Nursing Diagnoses: Potential for falls Potential for injury related to falls Goals: Patient will not experience any injury related to falls Date Initiated: 01/27/2021 Target Resolution Date: 02/26/2021 Goal Status: Active Patient/caregiver will verbalize/demonstrate measures taken to prevent injury and/or falls Date Initiated: 01/27/2021 Target Resolution Date: 02/26/2021 Goal Status: Active Interventions: Assess Activities of Daily Living upon admission and as needed Assess fall risk on admission and as needed Assess: immobility, friction, shearing, incontinence upon admission and as needed Assess impairment of mobility on admission and as needed per policy Assess personal safety and home safety (as indicated) on admission and as needed Assess self care needs on admission and as needed Provide education on personal and home safety Notes: Venous Leg Ulcer Nursing Diagnoses: Actual venous Insuffiency (use after diagnosis is confirmed) Knowledge deficit related to disease process and management Goals: Patient will maintain optimal edema control Date Initiated: 01/27/2021 Target Resolution Date: 02/26/2021 Goal Status: Active Patient/caregiver  will verbalize understanding of disease process and disease management Date Initiated:  01/27/2021 Target Resolution Date: 02/26/2021 Goal Status: Active Interventions: Assess peripheral edema status every visit. Compression as ordered Provide education on venous insufficiency Notes: Wound/Skin Impairment Nursing Diagnoses: Impaired tissue integrity Knowledge deficit related to ulceration/compromised skin integrity Goals: Patient/caregiver will verbalize understanding of skin care regimen Date Initiated: 01/27/2021 Target Resolution Date: 02/26/2021 Goal Status: Active Interventions: Assess patient/caregiver ability to obtain necessary supplies Assess patient/caregiver ability to perform ulcer/skin care regimen upon admission and as needed Assess ulceration(s) every visit Provide education on ulcer and skin care Notes: Electronic Signature(s) Signed: 02/04/2021 6:14:19 PM By: Deon Pilling Entered By: Deon Pilling on 02/04/2021 11:12:45 -------------------------------------------------------------------------------- Pain Assessment Details Patient Name: Date of Service: Schiraldi, [name] 02/04/2021 10:45 A M Medical Record Number: [id number] Patient Account Number: [id number] Date of Birth/Sex: Treating RN: 27-Oct-1950 (70 y.o. Nancy Fetter Primary Care Catlin Aycock: Leslie Andrea Other Clinician: Referring Asuna Peth: Treating Jacinta Penalver/Extender: Oretha Ellis in Treatment: 1 Active Problems Location of Pain Severity and Description of Pain Patient Has Paino Yes Site Locations Pain Location: Pain in Ulcers With Dressing Change: Yes Duration of the Pain. Constant / Intermittento Intermittent Rate the pain. Current Pain Level: 8 Character of Pain Describe the Pain: Throbbing Pain Management and Medication Current Pain Management: Medication: Yes Cold Application: No Rest: No Massage: No Activity: No T.E.N.S.: No Heat Application: No Leg drop or elevation: No Is the Current Pain Management Adequate: Adequate How does your  wound impact your activities of daily livingo Sleep: No Bathing: No Appetite: No Relationship With Others: No Bladder Continence: No Emotions: No Bowel Continence: No Work: No Toileting: No Drive: No Dressing: No Hobbies: No Electronic Signature(s) Signed: 02/04/2021 6:01:10 PM By: Levan Hurst RN, BSN Entered By: Levan Hurst on 02/04/2021 10:55:48 -------------------------------------------------------------------------------- Patient/Caregiver Education Details Patient Name: Date of Service: Casimer Lanius 7/14/2022andnbsp10:45 A M Medical Record Number: [id number] Patient Account Number: [id number] Date of Birth/Gender: Treating RN: 10-08-1950 (70 y.o. [name] Primary Care Physician: Leslie Andrea Other Clinician: Referring Physician: Treating Physician/Extender: Oretha Ellis in Treatment: 1 Education Assessment Education Provided To: Patient Education Topics Provided Wound/Skin Impairment: Handouts: Skin Care Do's and Dont's Methods: Explain/Verbal Responses: Reinforcements needed Electronic Signature(s) Signed: 02/04/2021 6:14:19 PM By: Deon Pilling Entered By: Deon Pilling on 02/04/2021 11:22:06 -------------------------------------------------------------------------------- Wound Assessment Details Patient Name: Date of Service: Zilka, [name] 02/04/2021 10:45 A M Medical Record Number: [id number] Patient Account Number: [id number] Date of Birth/Sex: Treating RN: 10-06-1950 (70 y.o. Nancy Fetter Primary Care Quintell Bonnin: Leslie Andrea Other Clinician: Referring Veretta Sabourin: Treating Dhana Totton/Extender: Oretha Ellis in Treatment: 1 Wound Status Wound Number: 1 Primary Lymphedema Etiology: Wound Location: Right, Anterior Lower Leg Wound Open Wounding Event: Blister Status: Date Acquired: 07/25/2020 Comorbid Cataracts, Lymphedema, Asthma, Sleep Apnea,  Hypertension, Weeks Of Treatment: 1 History: Gout, Osteoarthritis Clustered Wound: No Wound Measurements Length: (cm) 1.5 Width: (cm) 0.5 Depth: (cm) 0.1 Area: (cm) 0.589 Volume: (cm) 0.059 % Reduction in Area: 92.9% % Reduction in Volume: 92.9% Epithelialization: Small (1-33%) Tunneling: No Undermining: No Wound Description Classification: Full Thickness Without Exposed Support Structures Wound Margin: Distinct, outline attached Exudate Amount: Medium Exudate Type: Serous Exudate Color: amber Foul Odor After Cleansing: No Slough/Fibrino No Wound Bed Granulation Amount: Large (67-100%) Exposed Structure Granulation Quality: Pink Fascia Exposed: No Necrotic Amount: None Present (0%) Fat Layer (Subcutaneous Tissue) Exposed: Yes Tendon Exposed: No Muscle Exposed: No Joint Exposed: No Bone Exposed:  No Electronic Signature(s) Signed: 02/04/2021 6:01:10 PM By: Levan Hurst RN, BSN Entered By: Levan Hurst on 02/04/2021 11:04:19 -------------------------------------------------------------------------------- Wound Assessment Details Patient Name: Date of Service: Hollett, [name] 02/04/2021 10:45 A M Medical Record Number: [id number] Patient Account Number: [id number] Date of Birth/Sex: Treating RN: Aug 09, 1950 (70 y.o. Nancy Fetter Primary Care Randol Zumstein: Leslie Andrea Other Clinician: Referring Olubunmi Rothenberger: Treating Dianca Owensby/Extender: Oretha Ellis in Treatment: 1 Wound Status Wound Number: 2 Primary Lymphedema Etiology: Wound Location: Left, Circumferential Lower Leg Wound Open Wounding Event: Gradually Appeared Status: Date Acquired: 07/25/2020 Comorbid Cataracts, Lymphedema, Asthma, Sleep Apnea, Hypertension, Weeks Of Treatment: 1 History: Gout, Osteoarthritis Clustered Wound: No Wound Measurements Length: (cm) 12.5 Width: (cm) 37 Depth: (cm) 0.1 Area: (cm) 363.247 Volume: (cm) 36.325 % Reduction in Area: 20.9% %  Reduction in Volume: 20.9% Epithelialization: Small (1-33%) Tunneling: No Undermining: No Wound Description Classification: Full Thickness Without Exposed Support Structures Wound Margin: Distinct, outline attached Exudate Amount: Large Exudate Type: Serosanguineous Exudate Color: red, brown Foul Odor After Cleansing: No Slough/Fibrino No Wound Bed Granulation Amount: Large (67-100%) Exposed Structure Granulation Quality: Pink, Pale Fascia Exposed: No Necrotic Amount: None Present (0%) Fat Layer (Subcutaneous Tissue) Exposed: Yes Tendon Exposed: No Muscle Exposed: No Joint Exposed: No Bone Exposed: No Electronic Signature(s) Signed: 02/04/2021 6:01:10 PM By: Levan Hurst RN, BSN Entered By: Levan Hurst on 02/04/2021 11:04:32 -------------------------------------------------------------------------------- Wound Assessment Details Patient Name: Date of Service: Hendel, [name] 02/04/2021 10:45 A M Medical Record Number: [id number] Patient Account Number: [id number] Date of Birth/Sex: Treating RN: [birth year] (70 y.o. Nancy Fetter Primary Care Kitiara Hintze: Leslie Andrea Other Clinician: Referring Kayvion Arneson: Treating Gisel Vipond/Extender: Oretha Ellis in Treatment: 1 Wound Status Wound Number: 3 Primary Pressure Ulcer Etiology: Wound Location: Left Calcaneus Wound Open Wounding Event: Not Known Status: Date Acquired: 12/28/2020 Comorbid Cataracts, Lymphedema, Asthma, Sleep Apnea, Hypertension, Weeks Of Treatment: 1 History: Gout, Osteoarthritis Clustered Wound: No Wound Measurements Length: (cm) 1.4 Width: (cm) 1.6 Depth: (cm) 1.2 Area: (cm) 1.759 Volume: (cm) 2.111 % Reduction in Area: -299.8% % Reduction in Volume: -2298.9% Epithelialization: None Tunneling: No Undermining: No Wound Description Classification: Unstageable/Unclassified Wound Margin: Distinct, outline attached Exudate Amount: Medium Exudate Type:  Serosanguineous Exudate Color: red, brown Foul Odor After Cleansing: No Slough/Fibrino Yes Wound Bed Granulation Amount: None Present (0%) Exposed Structure Necrotic Amount: Large (67-100%) Fascia Exposed: No Necrotic Quality: Eschar, Adherent Slough Fat Layer (Subcutaneous Tissue) Exposed: No Tendon Exposed: No Muscle Exposed: No Joint Exposed: No Bone Exposed: No Electronic Signature(s) Signed: 02/04/2021 6:01:10 PM By: Levan Hurst RN, BSN Entered By: Levan Hurst on 02/04/2021 11:05:09 -------------------------------------------------------------------------------- [place] Details Patient Name: Date of Service: Colborn, [name] 02/04/2021 10:45 A M Medical Record Number: [id number] Patient Account Number: [id number] Date of Birth/Sex: Treating RN: 1950-07-27 (70 y.o. Nancy Fetter Primary Care Gwendoline Judy: Leslie Andrea Other Clinician: Referring Damyon Mullane: Treating Chaylee Ehrsam/Extender: Oretha Ellis in Treatment: 1 Vital Signs Time Taken: 10:55 Temperature (F): 97.4 Height (in): 65 Pulse (bpm): 78 Weight (lbs): 239 Respiratory Rate (breaths/min): 18 Body Mass Index (BMI): 39.8 Blood Pressure (mmHg): 138/76 Reference Range: 80 - 120 mg / dl Electronic Signature(s) Signed: 02/04/2021 6:01:10 PM By: Levan Hurst RN, BSN Entered By: Levan Hurst on 02/04/2021 10:55:31

## 2021-02-11 ENCOUNTER — Inpatient Hospital Stay (HOSPITAL_COMMUNITY)
Admission: EM | Admit: 2021-02-11 | Discharge: 2021-02-24 | DRG: 252 | Disposition: A | Discharge status: Skilled Nursing Facility | Payer: Medicare Other | Attending: Internal Medicine | Admitting: Internal Medicine

## 2021-02-11 ENCOUNTER — Encounter (HOSPITAL_BASED_OUTPATIENT_CLINIC_OR_DEPARTMENT_OTHER): Payer: Medicare Other | Admitting: Internal Medicine

## 2021-02-11 ENCOUNTER — Emergency Department (HOSPITAL_COMMUNITY): Payer: Medicare Other

## 2021-02-11 ENCOUNTER — Other Ambulatory Visit: Payer: Self-pay

## 2021-02-11 ENCOUNTER — Encounter (HOSPITAL_COMMUNITY): Payer: Self-pay | Admitting: *Deleted

## 2021-02-11 DIAGNOSIS — Z9842 Cataract extraction status, left eye: Secondary | ICD-10-CM

## 2021-02-11 DIAGNOSIS — I70244 Atherosclerosis of native arteries of left leg with ulceration of heel and midfoot: Secondary | ICD-10-CM | POA: Diagnosis present

## 2021-02-11 DIAGNOSIS — Z7982 Long term (current) use of aspirin: Secondary | ICD-10-CM | POA: Diagnosis not present

## 2021-02-11 DIAGNOSIS — Z9119 Patient's noncompliance with other medical treatment and regimen: Secondary | ICD-10-CM | POA: Diagnosis not present

## 2021-02-11 DIAGNOSIS — Z7902 Long term (current) use of antithrombotics/antiplatelets: Secondary | ICD-10-CM

## 2021-02-11 DIAGNOSIS — Z9981 Dependence on supplemental oxygen: Secondary | ICD-10-CM | POA: Diagnosis not present

## 2021-02-11 DIAGNOSIS — G9341 Metabolic encephalopathy: Secondary | ICD-10-CM | POA: Diagnosis not present

## 2021-02-11 DIAGNOSIS — G9340 Encephalopathy, unspecified: Secondary | ICD-10-CM | POA: Diagnosis not present

## 2021-02-11 DIAGNOSIS — D72829 Elevated white blood cell count, unspecified: Secondary | ICD-10-CM | POA: Diagnosis not present

## 2021-02-11 DIAGNOSIS — M19071 Primary osteoarthritis, right ankle and foot: Secondary | ICD-10-CM | POA: Diagnosis present

## 2021-02-11 DIAGNOSIS — M869 Osteomyelitis, unspecified: Secondary | ICD-10-CM | POA: Diagnosis present

## 2021-02-11 DIAGNOSIS — E1159 Type 2 diabetes mellitus with other circulatory complications: Secondary | ICD-10-CM | POA: Diagnosis not present

## 2021-02-11 DIAGNOSIS — D638 Anemia in other chronic diseases classified elsewhere: Secondary | ICD-10-CM | POA: Diagnosis present

## 2021-02-11 DIAGNOSIS — R609 Edema, unspecified: Secondary | ICD-10-CM | POA: Diagnosis not present

## 2021-02-11 DIAGNOSIS — D62 Acute posthemorrhagic anemia: Secondary | ICD-10-CM | POA: Diagnosis not present

## 2021-02-11 DIAGNOSIS — E1151 Type 2 diabetes mellitus with diabetic peripheral angiopathy without gangrene: Secondary | ICD-10-CM | POA: Diagnosis present

## 2021-02-11 DIAGNOSIS — E1142 Type 2 diabetes mellitus with diabetic polyneuropathy: Secondary | ICD-10-CM | POA: Diagnosis present

## 2021-02-11 DIAGNOSIS — I10 Essential (primary) hypertension: Secondary | ICD-10-CM | POA: Diagnosis present

## 2021-02-11 DIAGNOSIS — F32A Depression, unspecified: Secondary | ICD-10-CM | POA: Diagnosis present

## 2021-02-11 DIAGNOSIS — Z20822 Contact with and (suspected) exposure to covid-19: Secondary | ICD-10-CM | POA: Diagnosis present

## 2021-02-11 DIAGNOSIS — R0602 Shortness of breath: Secondary | ICD-10-CM

## 2021-02-11 DIAGNOSIS — Z6839 Body mass index (BMI) 39.0-39.9, adult: Secondary | ICD-10-CM

## 2021-02-11 DIAGNOSIS — S91309A Unspecified open wound, unspecified foot, initial encounter: Secondary | ICD-10-CM | POA: Diagnosis present

## 2021-02-11 DIAGNOSIS — E662 Morbid (severe) obesity with alveolar hypoventilation: Secondary | ICD-10-CM | POA: Diagnosis present

## 2021-02-11 DIAGNOSIS — E874 Mixed disorder of acid-base balance: Secondary | ICD-10-CM | POA: Diagnosis not present

## 2021-02-11 DIAGNOSIS — E782 Mixed hyperlipidemia: Secondary | ICD-10-CM | POA: Diagnosis not present

## 2021-02-11 DIAGNOSIS — E876 Hypokalemia: Secondary | ICD-10-CM | POA: Diagnosis not present

## 2021-02-11 DIAGNOSIS — E11628 Type 2 diabetes mellitus with other skin complications: Secondary | ICD-10-CM | POA: Diagnosis present

## 2021-02-11 DIAGNOSIS — Z87891 Personal history of nicotine dependence: Secondary | ICD-10-CM | POA: Diagnosis not present

## 2021-02-11 DIAGNOSIS — Z961 Presence of intraocular lens: Secondary | ICD-10-CM | POA: Diagnosis present

## 2021-02-11 DIAGNOSIS — E11621 Type 2 diabetes mellitus with foot ulcer: Secondary | ICD-10-CM | POA: Diagnosis present

## 2021-02-11 DIAGNOSIS — N179 Acute kidney failure, unspecified: Secondary | ICD-10-CM | POA: Diagnosis not present

## 2021-02-11 DIAGNOSIS — Z8349 Family history of other endocrine, nutritional and metabolic diseases: Secondary | ICD-10-CM

## 2021-02-11 DIAGNOSIS — R059 Cough, unspecified: Secondary | ICD-10-CM

## 2021-02-11 DIAGNOSIS — S91302A Unspecified open wound, left foot, initial encounter: Secondary | ICD-10-CM

## 2021-02-11 DIAGNOSIS — R197 Diarrhea, unspecified: Secondary | ICD-10-CM | POA: Diagnosis not present

## 2021-02-11 DIAGNOSIS — L97429 Non-pressure chronic ulcer of left heel and midfoot with unspecified severity: Secondary | ICD-10-CM | POA: Diagnosis present

## 2021-02-11 DIAGNOSIS — G7000 Myasthenia gravis without (acute) exacerbation: Secondary | ICD-10-CM | POA: Diagnosis present

## 2021-02-11 DIAGNOSIS — M109 Gout, unspecified: Secondary | ICD-10-CM | POA: Diagnosis not present

## 2021-02-11 DIAGNOSIS — Z7984 Long term (current) use of oral hypoglycemic drugs: Secondary | ICD-10-CM | POA: Diagnosis not present

## 2021-02-11 DIAGNOSIS — Z79899 Other long term (current) drug therapy: Secondary | ICD-10-CM

## 2021-02-11 DIAGNOSIS — Z609 Problem related to social environment, unspecified: Secondary | ICD-10-CM

## 2021-02-11 DIAGNOSIS — R4182 Altered mental status, unspecified: Secondary | ICD-10-CM | POA: Diagnosis not present

## 2021-02-11 DIAGNOSIS — Z8249 Family history of ischemic heart disease and other diseases of the circulatory system: Secondary | ICD-10-CM

## 2021-02-11 DIAGNOSIS — R600 Localized edema: Secondary | ICD-10-CM | POA: Diagnosis not present

## 2021-02-11 DIAGNOSIS — I890 Lymphedema, not elsewhere classified: Secondary | ICD-10-CM | POA: Diagnosis present

## 2021-02-11 DIAGNOSIS — M86172 Other acute osteomyelitis, left ankle and foot: Secondary | ICD-10-CM | POA: Diagnosis not present

## 2021-02-11 DIAGNOSIS — E785 Hyperlipidemia, unspecified: Secondary | ICD-10-CM | POA: Diagnosis present

## 2021-02-11 DIAGNOSIS — R8271 Bacteriuria: Secondary | ICD-10-CM | POA: Diagnosis not present

## 2021-02-11 DIAGNOSIS — G4733 Obstructive sleep apnea (adult) (pediatric): Secondary | ICD-10-CM | POA: Diagnosis present

## 2021-02-11 DIAGNOSIS — L03116 Cellulitis of left lower limb: Secondary | ICD-10-CM | POA: Diagnosis present

## 2021-02-11 DIAGNOSIS — E119 Type 2 diabetes mellitus without complications: Secondary | ICD-10-CM

## 2021-02-11 DIAGNOSIS — F419 Anxiety disorder, unspecified: Secondary | ICD-10-CM | POA: Diagnosis present

## 2021-02-11 DIAGNOSIS — K859 Acute pancreatitis without necrosis or infection, unspecified: Secondary | ICD-10-CM | POA: Diagnosis not present

## 2021-02-11 DIAGNOSIS — Z659 Problem related to unspecified psychosocial circumstances: Secondary | ICD-10-CM

## 2021-02-11 DIAGNOSIS — M79676 Pain in unspecified toe(s): Secondary | ICD-10-CM

## 2021-02-11 DIAGNOSIS — Z9841 Cataract extraction status, right eye: Secondary | ICD-10-CM

## 2021-02-11 DIAGNOSIS — Z833 Family history of diabetes mellitus: Secondary | ICD-10-CM

## 2021-02-11 DIAGNOSIS — K219 Gastro-esophageal reflux disease without esophagitis: Secondary | ICD-10-CM | POA: Diagnosis present

## 2021-02-11 LAB — BASIC METABOLIC PANEL
Anion gap: 8 (ref 5–15)
BUN: 16 mg/dL (ref 8–23)
CO2: 22 mmol/L (ref 22–32)
Calcium: 8.2 mg/dL — ABNORMAL LOW (ref 8.9–10.3)
Chloride: 104 mmol/L (ref 98–111)
Creatinine, Ser: 1.02 mg/dL — ABNORMAL HIGH (ref 0.44–1.00)
GFR, Estimated: 59 mL/min — ABNORMAL LOW (ref >60)
Glucose, Bld: 98 mg/dL (ref 70–99)
Potassium: 4.3 mmol/L (ref 3.5–5.1)
Sodium: 134 mmol/L — ABNORMAL LOW (ref 135–145)

## 2021-02-11 LAB — CBC WITH DIFFERENTIAL/PLATELET
Abs Immature Granulocytes: 0.15 10*3/uL — ABNORMAL HIGH (ref 0.00–0.07)
Basophils Absolute: 0.1 10*3/uL (ref 0.0–0.1)
Basophils Relative: 1 %
Eosinophils Absolute: 0.2 10*3/uL (ref 0.0–0.5)
Eosinophils Relative: 2 %
HCT: 29.6 % — ABNORMAL LOW (ref 36.0–46.0)
Hemoglobin: 10 g/dL — ABNORMAL LOW (ref 12.0–15.0)
Immature Granulocytes: 1 %
Lymphocytes Relative: 14 %
Lymphs Abs: 1.6 10*3/uL (ref 0.7–4.0)
MCH: 31.4 pg (ref 26.0–34.0)
MCHC: 33.8 g/dL (ref 30.0–36.0)
MCV: 93.1 fL (ref 80.0–100.0)
Monocytes Absolute: 1.2 10*3/uL — ABNORMAL HIGH (ref 0.1–1.0)
Monocytes Relative: 11 %
Neutro Abs: 7.7 10*3/uL (ref 1.7–7.7)
Neutrophils Relative %: 71 %
Platelets: 378 10*3/uL (ref 150–400)
RBC: 3.18 MIL/uL — ABNORMAL LOW (ref 3.87–5.11)
RDW: 16.8 % — ABNORMAL HIGH (ref 11.5–15.5)
WBC: 10.9 10*3/uL — ABNORMAL HIGH (ref 4.0–10.5)
nRBC: 0 % (ref 0.0–0.2)

## 2021-02-11 LAB — MAGNESIUM: Magnesium: 1.3 mg/dL — ABNORMAL LOW (ref 1.7–2.4)

## 2021-02-11 LAB — RESP PANEL BY RT-PCR (FLU A&B, COVID) ARPGX2
Influenza A by PCR: NEGATIVE
Influenza B by PCR: NEGATIVE
SARS Coronavirus 2 by RT PCR: NEGATIVE

## 2021-02-11 MED ORDER — ALBUTEROL SULFATE HFA 108 (90 BASE) MCG/ACT IN AERS: 2.0000 | INHALATION_SPRAY | RESPIRATORY_TRACT | Status: DC | PRN

## 2021-02-11 MED ORDER — ONDANSETRON HCL 4 MG/2ML IJ SOLN
4.0000 mg | Freq: Once | INTRAMUSCULAR | Status: AC
  Administered 2021-02-11: 4 mg via INTRAVENOUS
  Filled 2021-02-11: qty 2

## 2021-02-11 MED ORDER — PYRIDOSTIGMINE BROMIDE 60 MG PO TABS
60.0000 mg | ORAL_TABLET | Freq: Three times a day (TID) | ORAL | Status: DC
  Administered 2021-02-12 – 2021-02-24 (×34): 60 mg via ORAL
  Filled 2021-02-11 (×42): qty 1

## 2021-02-11 MED ORDER — SODIUM CHLORIDE 0.9 % IV SOLN
2.0000 g | Freq: Three times a day (TID) | INTRAVENOUS | Status: DC
  Administered 2021-02-11 – 2021-02-14 (×9): 2 g via INTRAVENOUS
  Filled 2021-02-11 (×9): qty 2

## 2021-02-11 MED ORDER — HEPARIN SODIUM (PORCINE) 5000 UNIT/ML IJ SOLN
5000.0000 [IU] | Freq: Three times a day (TID) | INTRAMUSCULAR | Status: DC
  Administered 2021-02-11 – 2021-02-15 (×10): 5000 [IU] via SUBCUTANEOUS
  Filled 2021-02-11 (×11): qty 1

## 2021-02-11 MED ORDER — BUSPIRONE HCL 5 MG PO TABS
5.0000 mg | ORAL_TABLET | Freq: Two times a day (BID) | ORAL | Status: DC
  Administered 2021-02-11 – 2021-02-24 (×24): 5 mg via ORAL
  Filled 2021-02-11 (×24): qty 1

## 2021-02-11 MED ORDER — SODIUM CHLORIDE 0.9 % IV SOLN: 2.0000 g | Freq: Once | INTRAVENOUS | Status: DC

## 2021-02-11 MED ORDER — POLYETHYLENE GLYCOL 3350 17 G PO PACK: 17.0000 g | PACK | Freq: Every day | ORAL | Status: DC | PRN

## 2021-02-11 MED ORDER — ALBUTEROL SULFATE (2.5 MG/3ML) 0.083% IN NEBU: 2.5000 mg | INHALATION_SOLUTION | RESPIRATORY_TRACT | Status: DC | PRN

## 2021-02-11 MED ORDER — ATORVASTATIN CALCIUM 40 MG PO TABS
40.0000 mg | ORAL_TABLET | Freq: Every day | ORAL | Status: DC
  Administered 2021-02-12 – 2021-02-24 (×13): 40 mg via ORAL
  Filled 2021-02-11 (×13): qty 1

## 2021-02-11 MED ORDER — MORPHINE SULFATE (PF) 4 MG/ML IV SOLN
4.0000 mg | Freq: Once | INTRAVENOUS | Status: AC
  Administered 2021-02-11: 4 mg via INTRAVENOUS
  Filled 2021-02-11: qty 1

## 2021-02-11 MED ORDER — ONDANSETRON HCL 4 MG PO TABS: 4.0000 mg | ORAL_TABLET | Freq: Four times a day (QID) | ORAL | Status: DC | PRN

## 2021-02-11 MED ORDER — ACETAMINOPHEN 325 MG PO TABS: 650.0000 mg | ORAL_TABLET | Freq: Four times a day (QID) | ORAL | Status: DC | PRN

## 2021-02-11 MED ORDER — HYDROCODONE-ACETAMINOPHEN 7.5-325 MG PO TABS
1.0000 | ORAL_TABLET | Freq: Two times a day (BID) | ORAL | Status: DC | PRN
  Administered 2021-02-13 – 2021-02-15 (×3): 1 via ORAL
  Filled 2021-02-11 (×3): qty 1

## 2021-02-11 MED ORDER — TRAZODONE HCL 100 MG PO TABS
100.0000 mg | ORAL_TABLET | Freq: Every evening | ORAL | Status: DC | PRN
  Administered 2021-02-13: 100 mg via ORAL
  Filled 2021-02-11: qty 1

## 2021-02-11 MED ORDER — VANCOMYCIN HCL 2000 MG/400ML IV SOLN
2000.0000 mg | Freq: Once | INTRAVENOUS | Status: AC
  Administered 2021-02-11: 2000 mg via INTRAVENOUS
  Filled 2021-02-11: qty 400

## 2021-02-11 MED ORDER — ACETAMINOPHEN 650 MG RE SUPP: 650.0000 mg | Freq: Four times a day (QID) | RECTAL | Status: DC | PRN

## 2021-02-11 MED ORDER — ASPIRIN EC 81 MG PO TBEC
81.0000 mg | DELAYED_RELEASE_TABLET | Freq: Every evening | ORAL | Status: DC
  Administered 2021-02-11 – 2021-02-15 (×5): 81 mg via ORAL
  Filled 2021-02-11 (×5): qty 1

## 2021-02-11 MED ORDER — LACTATED RINGERS IV BOLUS
500.0000 mL | Freq: Once | INTRAVENOUS | Status: AC
  Administered 2021-02-11: 500 mL via INTRAVENOUS

## 2021-02-11 MED ORDER — GADOBUTROL 1 MMOL/ML IV SOLN
10.0000 mL | Freq: Once | INTRAVENOUS | Status: AC | PRN
  Administered 2021-02-11: 10 mL via INTRAVENOUS

## 2021-02-11 MED ORDER — MORPHINE SULFATE (PF) 2 MG/ML IV SOLN
2.0000 mg | INTRAVENOUS | Status: DC | PRN
  Administered 2021-02-11 – 2021-02-12 (×6): 2 mg via INTRAVENOUS
  Filled 2021-02-11 (×6): qty 1

## 2021-02-11 MED ORDER — PANTOPRAZOLE SODIUM 40 MG PO TBEC
40.0000 mg | DELAYED_RELEASE_TABLET | Freq: Every day | ORAL | Status: DC
  Administered 2021-02-12 – 2021-02-24 (×13): 40 mg via ORAL
  Filled 2021-02-11 (×13): qty 1

## 2021-02-11 MED ORDER — ONDANSETRON HCL 4 MG/2ML IJ SOLN
4.0000 mg | Freq: Four times a day (QID) | INTRAMUSCULAR | Status: DC | PRN
  Administered 2021-02-13 (×2): 4 mg via INTRAVENOUS
  Filled 2021-02-11 (×2): qty 2

## 2021-02-11 MED ORDER — FUROSEMIDE 40 MG PO TABS
40.0000 mg | ORAL_TABLET | Freq: Every day | ORAL | Status: DC
  Administered 2021-02-12 – 2021-02-14 (×3): 40 mg via ORAL
  Filled 2021-02-11 (×3): qty 1

## 2021-02-11 MED ORDER — VANCOMYCIN HCL 1500 MG/300ML IV SOLN
1500.0000 mg | INTRAVENOUS | Status: DC
  Administered 2021-02-12 – 2021-02-14 (×3): 1500 mg via INTRAVENOUS
  Filled 2021-02-11 (×4): qty 300

## 2021-02-11 MED ORDER — MAGNESIUM SULFATE 2 GM/50ML IV SOLN
2.0000 g | Freq: Once | INTRAVENOUS | Status: AC
  Administered 2021-02-11: 2 g via INTRAVENOUS
  Filled 2021-02-11: qty 50

## 2021-02-11 MED ORDER — POLYETHYLENE GLYCOL 3350 17 GM/SCOOP PO POWD: 17.0000 g | Freq: Every day | ORAL | Status: DC | PRN

## 2021-02-11 NOTE — ED Notes (Signed)
Patient transported to MRI 

## 2021-02-11 NOTE — ED Provider Notes (Signed)
[facility] Provider Note   CSN: [id number] Arrival date & time: 02/11/21  1318     History Chief Complaint  Patient presents with   Leg Pain    [name] is a 70 y.o. female.  HPI     [name] is a 70 y.o. female, with a history of asthma, anxiety, hyperlipidemia, HTN, presenting to the ED from her wound care clinic with worsening wound. Patient has been under the care of Dr. Dellia Nims for this issue. She arrives with a note (see below). His concern is that her wound continues to worsen and even despite wound care and oral Cipro. In short, he is concerned for possible osteomyelitis and is requesting IV antibiotics and MRI. Patient endorses worsening pain and swelling to the left foot as well as increased drainage from the wound on the bottom of the left heel. Patient denies fever, numbness, weakness, trauma, acute shortness of breath, chest pain, or any other complaints.       Past Medical History:  Diagnosis Date   Achilles tendinitis    Anxiety    Asthma    Depression    GERD (gastroesophageal reflux disease)    Gout    H/O myasthenia gravis    left eye   HTN (hypertension)    Hyperlipidemia    Low back pain    Obesity hypoventilation syndrome ([place])    Obstructive sleep apnea    Osteoarthritis     Patient Active Problem List   Diagnosis Date Noted   Osteomyelitis ([place]) 02/11/2021   Abscess of right lower extremity excluding foot 10/18/2020   Abscess of right leg 10/18/2020   Dehydration 10/03/2020   Leukocytosis 10/03/2020   Thrombocytosis 10/03/2020   Hyponatremia 10/03/2020   Hypochloremia 10/03/2020   Failure to thrive in adult 10/03/2020   Acute renal failure superimposed on stage 3b chronic kidney disease ([place]) 10/03/2020   Uncontrolled type 2 diabetes mellitus with hyperglycemia, without long-term current use of insulin ([place]) 10/03/2020   Acute kidney injury superimposed on CKD ([place]) 10/02/2020   Chronic  kidney disease 10/01/2020   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   Chronic insomnia 01/08/2020   Ocular myasthenia gravis ([place]) 09/18/2019   Peripheral edema 01/23/2019   Asthma 05/14/2015   Heel spur 02/24/2015   Depression 02/24/2015   Post-menopausal bleeding 06/02/2014   OA (osteoarthritis) of knee 04/09/2014   Epidermal cyst 10/01/2013   Class 3 obesity 02/25/2013   Back pain 03/04/2012   Gout 01/25/2012   Edema 01/28/2011   Obesity hypoventilation syndrome ([place]) 06/16/2008   OBSTRUCTIVE SLEEP APNEA 05/08/2008   Hyperlipidemia 08/28/2006   Anxiety 08/28/2006   Essential hypertension 08/28/2006   GERD 08/28/2006   Osteoarthritis 08/28/2006    Past Surgical History:  Procedure Laterality Date   CATARACT EXTRACTION W/PHACO Left 09/17/2015   Procedure: CATARACT EXTRACTION PHACO AND INTRAOCULAR LENS PLACEMENT LEFT EYE cde=8.58;  Surgeon: Tonny Branch, MD;  Location: [facility];  Service: Ophthalmology;  Laterality: Left;   CATARACT EXTRACTION W/PHACO Right 05/15/2017   Procedure: CATARACT EXTRACTION PHACO AND INTRAOCULAR LENS PLACEMENT (IOC);  Surgeon: Tonny Branch, MD;  Location: [facility];  Service: Ophthalmology;  Laterality: Right;  CDE: 6.34   COLONOSCOPY N/A 12/25/2013   Procedure: COLONOSCOPY;  Surgeon: Rogene [place], MD;  Location: [facility];  Service: Endoscopy;  Laterality: N/A;  730-rescheduled Ann notified pt   COLONOSCOPY WITH PROPOFOL N/A 10/04/2019   Procedure: COLONOSCOPY WITH PROPOFOL;  Surgeon: Rogene [place], MD;  Location: [facility];  Service: Endoscopy;  Laterality: N/A;  815   CYST EXCISION N/A 09/12/2016   Procedure: EXCISION SEBACEOUS CYST, BACK;  Surgeon: Aviva Signs, MD;  Location: [facility];  Service: General;  Laterality: N/A;   INTRAOCULAR LENS INSERTION Bilateral 2018   Dr. Tonny Branch    POLYPECTOMY  10/04/2019   Procedure: POLYPECTOMY;  Surgeon: Rogene [place], MD;  Location: [facility];  Service: Endoscopy;;   TUBAL LIGATION        OB History     Gravida  3   Para  3   Term  3   Preterm      AB      Living  3      SAB      IAB      Ectopic      Multiple      Live Births              Family History  Problem Relation Age of Onset   Heart disease Mother    Thyroid disease Mother    Heart failure Father    Lung disease Father    Diabetes Brother    Crohn's disease Daughter     Social History   Tobacco Use   Smoking status: Former    Packs/day: 2.00    Years: 36.00    Pack years: 72.00    Types: Cigarettes    Start date: 06/25/1969    Quit date: 03/25/2005    Years since quitting: 15.8   Smokeless tobacco: Never  Vaping Use   Vaping Use: Never used  Substance Use Topics   Alcohol use: No    Alcohol/week: 0.0 standard drinks   Drug use: No    Home Medications Prior to Admission medications   Medication Sig Start Date End Date Taking? Authorizing Provider  albuterol (VENTOLIN HFA) 108 (90 Base) MCG/ACT inhaler INHALE 2 PUFFS INTO THE LUNGS EVERY 4 HOURS AS NEEDED FOR WHEEZING OR SHORTNESS OF BREATH Patient taking differently: Inhale 2 puffs into the lungs every 4 (four) hours as needed for wheezing or shortness of breath. 01/23/20  Yes [place], Modena Nunnery, MD  allopurinol (ZYLOPRIM) 100 MG tablet TAKE 1 TABLET(100 MG) BY MOUTH DAILY Patient taking differently: Take 100 mg by mouth daily. 02/28/20  Yes [place], Modena Nunnery, MD  aspirin EC 81 MG tablet Take 1 tablet (81 mg total) by mouth every evening. 10/05/19  Yes Rehman, Mechele Dawley, MD  atorvastatin (LIPITOR) 40 MG tablet TAKE 1 TABLET(40 MG) BY MOUTH DAILY AT 6 PM Patient taking differently: Take 40 mg by mouth daily. 08/31/20  Yes [place], Modena Nunnery, MD  busPIRone (BUSPAR) 5 MG tablet TAKE 1 TABLET(5 MG) BY MOUTH TWICE DAILY FOR ANXIETY Patient taking differently: Take 5 mg by mouth 2 (two) times daily. 10/16/20  Yes [place], Modena Nunnery, MD  furosemide (LASIX) 40 MG tablet TAKE 1 TABLET(40 MG) BY MOUTH DAILY Patient taking differently:  Take 40 mg by mouth daily. 09/08/20  Yes [place], Modena Nunnery, MD  HYDROcodone-acetaminophen (NORCO) 7.5-325 MG tablet Take 1 tablet by mouth 2 (two) times daily as needed for moderate pain. Patient taking differently: Take 1 tablet by mouth in the morning and at bedtime. 09/30/20  Yes [place], Modena Nunnery, MD  Magnesium 250 MG TABS Take 1 tablet (250 mg total) by mouth daily. Patient taking differently: Take 250 mg by mouth daily in the afternoon. Midday 08/16/18  Yes [place], Modena Nunnery, MD  omeprazole [facility])  40 MG capsule TAKE 1 CAPSULE(40 MG) BY MOUTH DAILY Patient taking differently: Take 40 mg by mouth daily. 06/29/20  Yes [place], Modena Nunnery, MD  OXYGEN Inhale 1 L into the lungs at bedtime. IN ADDITION TO CPAP NIGHTLY   Yes [provider]  polyethylene glycol powder (MIRALAX) 17 GM/SCOOP powder Mix 1 scoop (17 grams) of powder in water daily to help with constipation. Patient taking differently: Take 17 g by mouth daily as needed for mild constipation or moderate constipation. 11/08/20  Yes [place], Margaux, PA-C  potassium chloride (KLOR-CON) 10 MEQ tablet TAKE 1 TABLET BY MOUTH DAILY WITH FLUID PILL Patient taking differently: Take 10 mEq by mouth daily. With fluid pill 09/01/20  Yes [place], Modena Nunnery, MD  pyridostigmine (MESTINON) 60 MG tablet Take 1 tablet (60 mg total) by mouth 3 (three) times daily. 12/01/20  Yes [place], Earlean Polka, MD  traZODone (DESYREL) 100 MG tablet TAKE 1 TABLET BY MOUTH AT BEDTIME AS NEEDED FOR SLEEP Patient taking differently: Take 100 mg by mouth at bedtime as needed for sleep. 07/07/20  Yes [place], Modena Nunnery, MD  ciprofloxacin (CIPRO) 500 MG tablet Take 500 mg by mouth 2 (two) times daily. 7 day course starting on 02/02/2021 02/02/21   [provider]  clotrimazole (LOTRIMIN) 1 % cream Apply 1 application topically 2 (two) times daily. Patient not taking: Reported on 02/11/2021 01/08/20   Alycia Rossetti, MD  diphenhydrAMINE (BENADRYL) 25 MG tablet Take 25  mg by mouth daily.    [provider]  Glucose Blood (BLOOD GLUCOSE TEST STRIPS) STRP Use as directed to monitor FSBS 3x weekly. Dx: R73.09. Patient not taking: Reported on 02/11/2021 05/25/20   Alycia Rossetti, MD  metFORMIN (GLUCOPHAGE) 500 MG tablet Take 1 tablet (500 mg total) by mouth 2 (two) times daily with a meal. Patient not taking: Reported on 02/11/2021 08/12/20   Alycia Rossetti, MD  nystatin (MYCOSTATIN/NYSTOP) powder Apply 1 application topically 3 (three) times daily. Patient not taking: No sig reported 01/08/20   Alycia Rossetti, MD  predniSONE (DELTASONE) 10 MG tablet Take 2 tablets (20 mg total) by mouth daily. Patient not taking: No sig reported 12/01/20   [place], Earlean Polka, MD    Allergies    Patient has no known allergies.  Review of Systems   Review of Systems  Constitutional:  Negative for chills, diaphoresis and fever.  Respiratory:  Negative for shortness of breath.   Cardiovascular:  Negative for chest pain.  Gastrointestinal:  Negative for nausea and vomiting.  Skin:  Positive for wound.  Neurological:  Negative for weakness and numbness.  All other systems reviewed and are negative.  Physical Exam Updated Vital Signs BP (!) 125/52 (BP Location: Right Arm)   Pulse 73   Temp 97.7 F (36.5 C)   Resp 20   SpO2 100%   Physical Exam Vitals and nursing note reviewed.  Constitutional:      General: She is not in acute distress.    Appearance: She is well-developed. She is obese. She is not diaphoretic.  HENT:     Head: Normocephalic and atraumatic.     Mouth/Throat:     Mouth: Mucous membranes are moist.     Pharynx: Oropharynx is clear.  Eyes:     Conjunctiva/sclera: Conjunctivae normal.  Cardiovascular:     Rate and Rhythm: Normal rate and regular rhythm.     Pulses: Normal pulses.          Radial pulses are  2+ on the right side and 2+ on the left side.       Posterior tibial pulses are 2+ on the right side and 2+ on the left side.      Heart sounds: Normal heart sounds.     Comments: Tactile temperature in the extremities appropriate and equal bilaterally. Pulmonary:     Effort: Pulmonary effort is normal. No respiratory distress.     Breath sounds: Normal breath sounds.  Abdominal:     Palpations: Abdomen is soft.     Tenderness: There is no abdominal tenderness. There is no guarding.  Musculoskeletal:     Cervical back: Neck supple.     Right lower leg: Edema present.     Left lower leg: Edema present.     Comments: Approximately 1 cm diameter wound to the plantar foot at the calcaneus. Swelling, tenderness, erythema surrounding wound.  Wound packing in place, which patient states was placed by wound care center earlier today. She does have lower extremity swelling bilaterally in the lower legs, worse on the left.  Lymphadenopathy:     Cervical: No cervical adenopathy.  Skin:    General: Skin is warm and dry.  Neurological:     Mental Status: She is alert.     Comments: Sensation to light touch grossly intact in lower extremities. Strength 5/5 in the bilateral lower extremities.  Psychiatric:        Mood and Affect: Mood and affect normal.        Speech: Speech normal.        Behavior: Behavior normal.    ED Results / Procedures / Treatments   Labs (all labs ordered are listed, but only abnormal results are displayed) Labs Reviewed  BASIC METABOLIC PANEL - Abnormal; Notable for the following components:      Result Value   Sodium 134 (*)    Creatinine, Ser 1.02 (*)    Calcium 8.2 (*)    GFR, Estimated 59 (*)    All other components within normal limits  CBC WITH DIFFERENTIAL/PLATELET - Abnormal; Notable for the following components:   WBC 10.9 (*)    RBC 3.18 (*)    Hemoglobin 10.0 (*)    HCT 29.6 (*)    RDW 16.8 (*)    Monocytes Absolute 1.2 (*)    Abs Immature Granulocytes 0.15 (*)    All other components within normal limits  MAGNESIUM - Abnormal; Notable for the following components:    Magnesium 1.3 (*)    All other components within normal limits  RESP PANEL BY RT-PCR (FLU A&B, COVID) [id number]  CULTURE, BLOOD (ROUTINE X 2)  CULTURE, BLOOD (ROUTINE X 2)  CREATININE, SERUM   WBC  Date Value Ref Range Status  02/11/2021 10.9 (H) 4.0 - 10.5 K/uL Final  11/21/2020 7.6 4.0 - 10.5 K/uL Final  11/08/2020 8.2 4.0 - 10.5 K/uL Final  10/20/2020 10.6 (H) 4.0 - 10.5 K/uL Final   Hemoglobin  Date Value Ref Range Status  02/11/2021 10.0 (L) 12.0 - 15.0 g/dL Final  11/21/2020 10.1 (L) 12.0 - 15.0 g/dL Final  11/08/2020 9.8 (L) 12.0 - 15.0 g/dL Final  10/20/2020 9.3 (L) 12.0 - 15.0 g/dL Final   BUN  Date Value Ref Range Status  02/11/2021 16 8 - 23 mg/dL Final  11/21/2020 11 8 - 23 mg/dL Final  11/08/2020 10 8 - 23 mg/dL Final  10/20/2020 26 (H) 8 - 23 mg/dL Final   Creat  Date Value Ref Range Status  10/01/2020 4.23 (H) 0.60 - 0.93 mg/dL Final    Comment:    For patients >58 years of age, the reference limit for Creatinine is approximately 13% higher for people identified as African-American. Marland Kitchen   09/21/2020 1.57 (H) 0.60 - 0.93 mg/dL Final    Comment:    For patients >28 years of age, the reference limit for Creatinine is approximately 13% higher for people identified as African-American. Marland Kitchen   09/16/2020 1.25 (H) 0.60 - 0.93 mg/dL Final    Comment:    For patients >68 years of age, the reference limit for Creatinine is approximately 13% higher for people identified as African-American. Marland Kitchen   08/12/2020 1.30 (H) 0.60 - 0.93 mg/dL Final    Comment:    For patients >26 years of age, the reference limit for Creatinine is approximately 13% higher for people identified as African-American. .    Creatinine, Ser  Date Value Ref Range Status  02/11/2021 1.02 (H) 0.44 - 1.00 mg/dL Final  01/14/2021 1.20 (H) 0.44 - 1.00 mg/dL Final  11/21/2020 1.33 (H) 0.44 - 1.00 mg/dL Final  11/08/2020 1.24 (H) 0.44 - 1.00 mg/dL Final     EKG None  Radiology MR FOOT LEFT  W WO CONTRAST  Result Date: 02/11/2021 CLINICAL DATA:  Foot swelling, diabetic, osteomyelitis suspected, xray done EXAM: MRI OF THE LEFT FOREFOOT WITHOUT AND WITH CONTRAST TECHNIQUE: Multiplanar, multisequence MR imaging of the left forefoot was performed both before and after administration of intravenous contrast. CONTRAST:  69mL GADAVIST GADOBUTROL 1 MMOL/ML IV SOLN COMPARISON:  Left foot radiograph 01/28/2021 FINDINGS: Bones/Joint/Cartilage There is a serpiginous lesion in the calcaneus posteriorly with preserved internal fat and a rim of T2 hyperintensity. There is mild fifth MTP arthritic change. There is mild linear enhancement and low T1 signal along the plantar aspect of the calcaneus near the plantar fascia insertion, partially visualized in a single plane. There is a partially visualized possible soft tissue ulcer along the plantar heel obscured by wraparound artifact on axial T1 image 39. In the forefoot, there is no evidence of osteomyelitis. Ligaments Intact Lisfranc ligament. Muscles and Tendons There is mild muscle atrophy with intramuscular edema commonly seen in diabetes. There is no evidence of acute tendon tear. Soft tissues There is extensive soft tissue swelling. There is focal soft tissue inflammatory change with edema and enhancement along the soft tissues adjacent to the distal phalanx of the fifth toe. IMPRESSION: Extensive soft tissue swelling and focal soft tissue inflammatory change at the tip of the fifth toe. No evidence of osteomyelitis in the forefoot. Partially visualized hindfoot with likely bone infarct in the calcaneus. There is linear low T1 signal and enhancement along the plantar aspect of the calcaneus near the plantar fascia insertion and possible overlying soft tissue ulcer along the plantar heel, partially visualized only on axial images. Consider dedicated ankle MRI to further evaluate for hindfoot infection. Electronically Signed   By: Maurine Simmering   On: 02/11/2021  18:49    Procedures Procedures   Medications Ordered in ED Medications  ceFEPIme (MAXIPIME) 2 g in sodium chloride 0.9 % 100 mL IVPB (0 g Intravenous Stopped 02/11/21 1800)  vancomycin (VANCOREADY) IVPB 1500 mg/300 mL (has no administration in time range)  vancomycin (VANCOREADY) IVPB 2000 mg/400 mL (2,000 mg Intravenous New Bag/Given 02/11/21 1829)  gadobutrol (GADAVIST) 1 MMOL/ML injection 10 mL (10 mLs Intravenous Contrast Given 02/11/21 1738)  morphine 4 MG/ML injection 4 mg (4 mg Intravenous Given 02/11/21 1912)  ondansetron (ZOFRAN) injection  4 mg (4 mg Intravenous Given 02/11/21 1912)  lactated ringers bolus 500 mL (500 mLs Intravenous New Bag/Given 02/11/21 1915)    ED Course  I have reviewed the triage vital signs and the nursing notes.  Pertinent labs & imaging results that were available during my care of the patient were reviewed by me and considered in my medical decision making (see chart for details).  Clinical Course as of 02/11/21 2221  Thu Feb 11, 2021  1525 Spoke with MRI tech. Discussed patient's symptoms. States MRI foot with and without contrast would be the most appropriate study.  [SJ]  [place]ace[place] with Annie Main, pharmacist, for advice on antibiotic choice. We discussed the patient's symptoms and timeline.  We also reviewed the patient's aerobic wound culture. We decided upon cefepime and vancomycin. [SJ]  2023 Spoke with Dr. Clearence Ped, Hospitalist.  Agrees to admit the patient. [SJ]    Clinical Course User Index [SJ] Jazyiah Yiu, Helane Gunther, PA-C   MDM Rules/Calculators/A&P                           Patient presents with a wound to the plantar left foot, worsening over the last couple weeks.  Increased pain, swelling, purulent discharge. Wound care physician reports worsening depth and size of the wound as well as increased discharge objectively.  For some reason, the MRI ordered did not include the entire foot.  Regardless, patient has had worsening wound with  evidence of possible wound infection despite outpatient antibiotics. Mild leukocytosis, but low suspicion for sepsis at this time.    Findings and plan of care discussed with attending physician, Isla Pence, MD. Dr. Gilford Raid personally evaluated and examined this patient.   Final Clinical Impression(s) / ED Diagnoses Final diagnoses:  Open wound of plantar aspect of foot, left, initial encounter    Rx / DC Orders ED Discharge Orders     None        Layla Maw 02/11/21 2222    Isla Pence, MD 02/11/21 2308

## 2021-02-11 NOTE — Progress Notes (Signed)
[name], [name] ([id number]) Visit Report for 02/11/2021 Debridement Details Patient Name: Date of Service: [name], [name] RES H. 02/11/2021 10:00 A M Medical Record Number: [id number] Patient Account Number: [id number] Date of Birth/Sex: Treating RN: 11-06-1950 (70 y.o. [name], [name] Primary Care Provider: Leslie Andrea Other Clinician: Referring Provider: Treating Provider/Extender: Oretha Ellis in Treatment: 2 Debridement Performed for Assessment: Wound #3 Left Calcaneus Performed By: Physician Ricard Dillon., MD Debridement Type: Debridement Level of Consciousness (Pre-procedure): Awake and Alert Pre-procedure Verification/Time Out Yes - 11:20 Taken: Start Time: 11:21 Pain Control: Lidocaine 4% T opical Solution T Area Debrided (L x W): otal 1.6 (cm) x 1.7 (cm) = 2.72 (cm) Tissue and other material debrided: Viable, Non-Viable, Bone, Slough, Subcutaneous, Skin: Dermis , Skin: Epidermis, Fibrin/Exudate, Slough Level: Skin/Subcutaneous Tissue/Muscle/Bone Debridement Description: Excisional Instrument: Curette Bleeding: Moderate Hemostasis Achieved: Pressure End Time: 11:27 Procedural Pain: 1 Post Procedural Pain: 4 Response to Treatment: Procedure was not tolerated well Comments: MD stopped debridement. Level of Consciousness (Post- Awake and Alert procedure): Post Debridement Measurements of Total Wound Length: (cm) 1.6 Stage: Unstageable/Unclassified Width: (cm) 1.7 Depth: (cm) 1.8 Volume: (cm) 3.845 Character of Wound/Ulcer Post Debridement: Requires Further Debridement Post Procedure Diagnosis Same as Pre-procedure Electronic Signature(s) Signed: 02/11/2021 5:24:41 PM By: Linton Ham MD Signed: 02/11/2021 6:31:47 PM By: Deon Pilling Entered By: Linton Ham on 02/11/2021 12:29:25 -------------------------------------------------------------------------------- HPI Details Patient Name: Date of Service: [name], [name]  RES H. 02/11/2021 10:00 A M Medical Record Number: [id number] Patient Account Number: [id number] Date of Birth/Sex: Treating RN: [birth year] (70 y.o. [name] Primary Care Provider: Leslie Andrea Other Clinician: Referring Provider: Treating Provider/Extender: Oretha Ellis in Treatment: 2 History of Present Illness HPI Description: ADMISSION 01/27/2021 This is a 70 year old woman who lives in [place]. She came down here by public transportation I believe. She had been followed up to the beginning of last month at the therapy wound care clinic in [place] however she was dismissed because of noncompliance with recommendations which includes getting family members to change her dressing, washing dressings and reapplying them etc. Very clear from the tone of their last note that they were really quite frustrated. She is listed as having lymphedema in her problem list. She comes in today with wraps involving her feet that went up just above her ankles. She has a punched-out wound on the left plantar heel which is erythematous and painful. She has circumferential skin breakdown on the left lower leg with weeping edema fluid. More dry flaking skin on the right leg with at least 2 areas that are weeping edema fluid. These would not be well covered by the dressings that she had put on. Past medical history includes bilateral lymphedema, venous stasis, ocular myasthenia, hypertension, hyperlipidemia, obesity hypoventilation syndrome with obstructive sleep apnea. She uses CPAP at night. Notable that she had venous reflux studies done at [facility] vein and vascular on 09/28/2020. There was no evidence of DVT from the common femoral through the popliteal veins no evidence of superficial venous thrombosis. On the right she had reflux in the right greater saphenous vein on the left venous reflux at the left saphenofemoral junction as well as venous reflux in the left greater  saphenous vein at the knee and in the calf there was also venous reflux in the left short saphenous vein. At this point I have not reviewed her follow-up with vascular if she had any ABIs in our clinic were 0.83 on the right 0.85 on the  left 7/14; admitted this patient to the clinic last week. She was advertises having lymphedema and circumferential wounds on her left greater than right leg indeed she had all of this however in addition she also had a punched-out area on the mid plantar heel. Completely necrotic surface. Culture I did of this showed Serratia. 2 days ago I change the antibiotic from the empiric doxycycline I gave her to Cipro. Encouraged to hold her trazodone while she was getting Cipro. An x-ray of the heel was negative for osteomyelitis, foreign body etc. 7/21; we were contacted this week by the patient's family. They apparently did not know how bad her wounds were. They think she needs to be in a facility not all related to her wound issues. Apparently not very functional at home. She came in today with a friend. The refractory lymphedema wounds on her legs are all healed however the area on her left plantar heel is worsening every time I see her. Necrotic debris on the surface probing to bone. Culture I did last time showed Serratia I gave her a week of Cipro presumably she took it. X-ray was negative for bone changes, foreign body etc. Electronic Signature(s) Signed: 02/11/2021 5:24:41 PM By: Linton Ham MD Entered By: Linton Ham on 02/11/2021 12:38:13 -------------------------------------------------------------------------------- Physical Exam Details Patient Name: Date of Service: [name], [name] RES H. 02/11/2021 10:00 A M Medical Record Number: [id number] Patient Account Number: [id number] Date of Birth/Sex: Treating RN: 30-Nov-1950 (70 y.o. [name] Primary Care Provider: Leslie Andrea Other Clinician: Referring Provider: Treating Provider/Extender:  Oretha Ellis in Treatment: 2 Constitutional Sitting or standing Blood Pressure is within target range for patient.. Pulse regular and within target range for patient.Marland Kitchen Respirations regular, non-labored and within target range.. Temperature is normal and within the target range for the patient.Marland Kitchen Appears in no distress. Notes Wound exam; the most problematic areas on the left plantar heel. This is worse than last time almost 100% necrotic material. I used a #3 curette to attempt debridement she does not tolerate this well and I am not able to get a full debridement however it is easy to tell that this probes to bone with a skinny. There is nothing open on her bilateral lower legs. We have better edema control in both legs although she still has swelling on the left greater than right Electronic Signature(s) Signed: 02/11/2021 5:24:41 PM By: Linton Ham MD Entered By: Linton Ham on 02/11/2021 12:39:58 -------------------------------------------------------------------------------- Physician Orders Details Patient Name: Date of Service: [name], [name] RES H. 02/11/2021 10:00 A M Medical Record Number: [id number] Patient Account Number: [id number] Date of Birth/Sex: Treating RN: 07/23/1951 (70 y.o. [name], Meta.Reding Primary Care Provider: Other Clinician: Leslie Andrea Referring Provider: Treating Provider/Extender: Oretha Ellis in Treatment: 2 Verbal / Phone Orders: No Diagnosis Coding ICD-10 Coding Code Description [phone] Pressure ulcer of left heel, unstageable I89.0 Lymphedema, not elsewhere classified L97.828 Non-pressure chronic ulcer of other part of left lower leg with other specified severity L97.811 Non-pressure chronic ulcer of other part of right lower leg limited to breakdown of skin Follow-up Appointments ppointment in: - Call to schedule appt after you get out of the hospital at [facility]. Please let  Emergency department know worrisome Return A with infection to the bone, wound to the bone left heel, and extreme pain. Will need a MRI to rule out bone infection. Bathing/ Shower/ Hygiene May shower with protection but do not get wound dressing(s) wet. - Use cast protector -  can buy at [facility], [facility], or [employer] Edema Control - Lymphedema / SCD / Other Elevate legs to the level of the heart or above for 30 minutes daily and/or when sitting, a frequency of: - throughout the day Avoid standing for long periods of time. Exercise regularly Off-Loading Wound #3 Left Calcaneus Wedge [name] to: - offloading heel sandal. wear while walking and standing. no pressure to left heel. Non Wound Condition Right Lower Extremity Other Non Wound Condition Orders/Instructions: - lotion, 4 layer compression wrap to right leg. Wound Treatment Wound #2 - Lower Leg Wound Laterality: Left, Circumferential Cleanser: Soap and Water (Home Health) 1 x Per Week/7 Days Discharge Instructions: May shower and wash wound with dial antibacterial soap and water prior to dressing change. Cleanser: Wound Cleanser (Home Health) 1 x Per Week/7 Days Discharge Instructions: Cleanse the wound with wound cleanser or normal saline prior to applying a clean dressing using gauze sponges, not tissue or cotton balls. Peri-Wound Care: Triamcinolone 15 (g) 1 x Per Week/7 Days Discharge Instructions: Use triamcinolone mixed with lotion, in clinic only Peri-Wound Care: Sween Lotion (Moisturizing lotion) ([place]) 1 x Per Week/7 Days Discharge Instructions: Apply moisturizing lotion as directed Prim Dressing: KerraCel Ag Gelling Fiber Dressing, 4x5 in (silver alginate) (Home Health) 1 x Per Week/7 Days ary Discharge Instructions: Apply silver alginate to wound bed as instructed Secondary Dressing: ABD Pad, 8x10 (Home Health) 1 x Per Week/7 Days Discharge Instructions: Apply over primary dressing as directed. Secondary Dressing: Zetuvit  Plus 4x8 in (Home Health) 1 x Per Week/7 Days Discharge Instructions: or other super absorbent dressing Compression Wrap: FourPress (4 layer compression wrap) 1 x Per Week/7 Days Discharge Instructions: Apply four layer compression as directed. May also use Miliken CoFlex 2 layer compression system as alternative. Wound #3 - Calcaneus Wound Laterality: Left Cleanser: Soap and Water (Home Health) 1 x Per Week/7 Days Discharge Instructions: May shower and wash wound with dial antibacterial soap and water prior to dressing change. Cleanser: Wound Cleanser (Home Health) 1 x Per Week/7 Days Discharge Instructions: Cleanse the wound with wound cleanser or normal saline prior to applying a clean dressing using gauze sponges, not tissue or cotton balls. Peri-Wound Care: Triamcinolone 15 (g) 1 x Per Week/7 Days Discharge Instructions: Use triamcinolone mixed with lotion, in clinic only Peri-Wound Care: Sween Lotion (Moisturizing lotion) ([place]) 1 x Per Week/7 Days Discharge Instructions: Apply moisturizing lotion as directed Prim Dressing: KerraCel Ag Gelling Fiber Dressing, 4x5 in (silver alginate) (Home Health) 1 x Per Week/7 Days ary Discharge Instructions: Apply silver alginate to wound bed as instructed Secondary Dressing: Woven Gauze Sponge, Non-Sterile 4x4 in (Home Health) 1 x Per Week/7 Days Discharge Instructions: Apply over primary dressing as directed. Secondary Dressing: ALLEVYN Heel 4 1/2in x 5 1/2in / 10.5cm x 13.5cm (Home Health) 1 x Per Week/7 Days Discharge Instructions: Apply over primary dressing as directed. Compression Wrap: FourPress (4 layer compression wrap) 1 x Per Week/7 Days Discharge Instructions: Apply four layer compression as directed. May also use Miliken CoFlex 2 layer compression system as alternative. Electronic Signature(s) Signed: 02/11/2021 5:24:41 PM By: Linton Ham MD Signed: 02/11/2021 6:31:47 PM By: Deon Pilling Entered By: Deon Pilling on  02/11/2021 11:38:28 -------------------------------------------------------------------------------- Problem List Details Patient Name: Date of Service: [name], [name] RES H. 02/11/2021 10:00 A M Medical Record Number: [id number] Patient Account Number: [id number] Date of Birth/Sex: Treating RN: [birth year] (70 y.o. [name] Primary Care Provider: Leslie Andrea Other Clinician: Referring Provider: Treating Provider/Extender: Linton Ham  Leslie Andrea Weeks in Treatment: 2 Active Problems ICD-10 Encounter Code Description Active Date MDM Diagnosis L89.620 Pressure ulcer of left heel, unstageable 01/27/2021 No Yes I89.0 Lymphedema, not elsewhere classified 01/27/2021 No Yes L97.828 Non-pressure chronic ulcer of other part of left lower leg with other specified 01/27/2021 No Yes severity L97.811 Non-pressure chronic ulcer of other part of right lower leg limited to breakdown 01/27/2021 No Yes of skin Inactive Problems Resolved Problems Electronic Signature(s) Signed: 02/11/2021 5:24:41 PM By: Linton Ham MD Entered By: Linton Ham on 02/11/2021 12:28:45 -------------------------------------------------------------------------------- Progress Note Details Patient Name: Date of Service: [name], [name] RES H. 02/11/2021 10:00 A M Medical Record Number: [id number] Patient Account Number: [id number] Date of Birth/Sex: Treating RN: 27-Aug-1950 (70 y.o. [name] Primary Care Provider: Leslie Andrea Other Clinician: Referring Provider: Treating Provider/Extender: Oretha Ellis in Treatment: 2 Subjective History of Present Illness (HPI) ADMISSION 01/27/2021 This is a 70 year old woman who lives in [place]. She came down here by public transportation I believe. She had been followed up to the beginning of last month at the therapy wound care clinic in [place] however she was dismissed because of noncompliance with recommendations  which includes getting family members to change her dressing, washing dressings and reapplying them etc. Very clear from the tone of their last note that they were really quite frustrated. She is listed as having lymphedema in her problem list. She comes in today with wraps involving her feet that went up just above her ankles. She has a punched-out wound on the left plantar heel which is erythematous and painful. She has circumferential skin breakdown on the left lower leg with weeping edema fluid. More dry flaking skin on the right leg with at least 2 areas that are weeping edema fluid. These would not be well covered by the dressings that she had put on. Past medical history includes bilateral lymphedema, venous stasis, ocular myasthenia, hypertension, hyperlipidemia, obesity hypoventilation syndrome with obstructive sleep apnea. She uses CPAP at night. Notable that she had venous reflux studies done at [facility] vein and vascular on 09/28/2020. There was no evidence of DVT from the common femoral through the popliteal veins no evidence of superficial venous thrombosis. On the right she had reflux in the right greater saphenous vein on the left venous reflux at the left saphenofemoral junction as well as venous reflux in the left greater saphenous vein at the knee and in the calf there was also venous reflux in the left short saphenous vein. At this point I have not reviewed her follow-up with vascular if she had any ABIs in our clinic were 0.83 on the right 0.85 on the left 7/14; admitted this patient to the clinic last week. She was advertises having lymphedema and circumferential wounds on her left greater than right leg indeed she had all of this however in addition she also had a punched-out area on the mid plantar heel. Completely necrotic surface. Culture I did of this showed Serratia. 2 days ago I change the antibiotic from the empiric doxycycline I gave her to Cipro. Encouraged to hold her  trazodone while she was getting Cipro. An x-ray of the heel was negative for osteomyelitis, foreign body etc. 7/21; we were contacted this week by the patient's family. They apparently did not know how bad her wounds were. They think she needs to be in a facility not all related to her wound issues. Apparently not very functional at home. She came in today with a friend. The  refractory lymphedema wounds on her legs are all healed however the area on her left plantar heel is worsening every time I see her. Necrotic debris on the surface probing to bone. Culture I did last time showed Serratia I gave her a week of Cipro presumably she took it. X-ray was negative for bone changes, foreign body etc. Objective Constitutional Sitting or standing Blood Pressure is within target range for patient.. Pulse regular and within target range for patient.Marland Kitchen Respirations regular, non-labored and within target range.. Temperature is normal and within the target range for the patient.Marland Kitchen Appears in no distress. Vitals Time Taken: 10:37 AM, Height: 65 in, Source: Stated, Weight: 239 lbs, Source: Stated, BMI: 39.8, Temperature: 97.6 F, Pulse: 79 bpm, Respiratory Rate: 18 breaths/min, Blood Pressure: 138/75 mmHg. General Notes: Wound exam; the most problematic areas on the left plantar heel. This is worse than last time almost 100% necrotic material. I used a #3 curette to attempt debridement she does not tolerate this well and I am not able to get a full debridement however it is easy to tell that this probes to bone with a skinny. There is nothing open on her bilateral lower legs. We have better edema control in both legs although she still has swelling on the left greater than right Integumentary (Hair, Skin) Wound #1 status is Healed - Epithelialized. Original cause of wound was Blister. The date acquired was: 07/25/2020. The wound has been in treatment 2 weeks. The wound is located on the Right,Anterior Lower Leg. The  wound measures 0cm length x 0cm width x 0cm depth; 0cm^2 area and 0cm^3 volume. There is no tunneling or undermining noted. There is a small amount of serous drainage noted. There is no granulation within the wound bed. There is no necrotic tissue within the wound bed. Wound #2 status is Open. Original cause of wound was Gradually Appeared. The date acquired was: 07/25/2020. The wound has been in treatment 2 weeks. The wound is located on the Left,Circumferential Lower Leg. The wound measures 0.1cm length x 0.1cm width x 0.1cm depth; 0.008cm^2 area and 0.001cm^3 volume. There is no tunneling or undermining noted. There is a small amount of serous drainage noted. The wound margin is distinct with the outline attached to the wound base. There is no granulation within the wound bed. There is no necrotic tissue within the wound bed. Wound #3 status is Open. Original cause of wound was Not Known. The date acquired was: 12/28/2020. The wound has been in treatment 2 weeks. The wound is located on the Left Calcaneus. The wound measures 1.6cm length x 1.7cm width x 1.8cm depth; 2.136cm^2 area and 3.845cm^3 volume. There is Fat Layer (Subcutaneous Tissue) exposed. There is no tunneling noted, however, there is undermining starting at 4:00 and ending at 5:00 with a maximum distance of 2.3cm. There is a medium amount of serosanguineous drainage noted. Foul odor after cleansing was noted. The wound margin is distinct with the outline attached to the wound base. There is small (1-33%) red granulation within the wound bed. There is a large (67-100%) amount of necrotic tissue within the wound bed including Adherent Slough. Assessment Active Problems ICD-10 Pressure ulcer of left heel, unstageable Lymphedema, not elsewhere classified Non-pressure chronic ulcer of other part of left lower leg with other specified severity Non-pressure chronic ulcer of other part of right lower leg limited to breakdown of  skin Procedures Wound #3 Pre-procedure diagnosis of Wound #3 is a Pressure Ulcer located on the Left Calcaneus .  There was a Excisional Skin/Subcutaneous Tissue/Muscle/Bone Debridement with a total area of 2.72 sq cm performed by Ricard Dillon., MD. With the following instrument(s): Curette to remove Viable and Non-Viable tissue/material. Material removed includes Bone,Subcutaneous Tissue, Slough, Skin: Dermis, Skin: Epidermis, and Fibrin/Exudate after achieving pain control using Lidocaine 4% T opical Solution. A time out was conducted at 11:20, prior to the start of the procedure. A Moderate amount of bleeding was controlled with Pressure. The procedure was not tolerated well with a pain level of 1 throughout and a pain level of 4 following the procedure. Response to Treatment Comments: MD stopped debridement.. Post Debridement Measurements: 1.6cm length x 1.7cm width x 1.8cm depth; 3.845cm^3 volume. Post debridement Stage noted as Unstageable/Unclassified. Character of Wound/Ulcer Post Debridement requires further debridement. Post procedure Diagnosis Wound #3: Same as Pre-Procedure Wound #2 Pre-procedure diagnosis of Wound #2 is a Lymphedema located on the Left,Circumferential Lower Leg . There was a Four Layer Compression Therapy Procedure by Levan Hurst, RN. Post procedure Diagnosis Wound #2: Same as Pre-Procedure There was a Four Layer Compression Therapy Procedure by Levan Hurst, RN. Post procedure Diagnosis Wound #: Same as Pre-Procedure Plan Follow-up Appointments: Return Appointment in: - Call to schedule appt after you get out of the hospital at [facility]. Please let Emergency department know worrisome with infection to the bone, wound to the bone left heel, and extreme pain. Will need a MRI to rule out bone infection. Bathing/ Shower/ Hygiene: May shower with protection but do not get wound dressing(s) wet. - Use cast protector - can buy at [facility], [facility], or  [employer] Edema Control - Lymphedema / SCD / Other: Elevate legs to the level of the heart or above for 30 minutes daily and/or when sitting, a frequency of: - throughout the day Avoid standing for long periods of time. Exercise regularly Off-Loading: Wound #3 Left Calcaneus: Wedge [name] to: - offloading heel sandal. wear while walking and standing. no pressure to left heel. Non Wound Condition: Other Non Wound Condition Orders/Instructions: - lotion, 4 layer compression wrap to right leg. WOUND #2: - Lower Leg Wound Laterality: Left, Circumferential Cleanser: Soap and Water (Home Health) 1 x Per Week/7 Days Discharge Instructions: May shower and wash wound with dial antibacterial soap and water prior to dressing change. Cleanser: Wound Cleanser (Home Health) 1 x Per Week/7 Days Discharge Instructions: Cleanse the wound with wound cleanser or normal saline prior to applying a clean dressing using gauze sponges, not tissue or cotton balls. Peri-Wound Care: Triamcinolone 15 (g) 1 x Per Week/7 Days Discharge Instructions: Use triamcinolone mixed with lotion, in clinic only Peri-Wound Care: Sween Lotion (Moisturizing lotion) ([place]) 1 x Per Week/7 Days Discharge Instructions: Apply moisturizing lotion as directed Prim Dressing: KerraCel Ag Gelling Fiber Dressing, 4x5 in (silver alginate) (Home Health) 1 x Per Week/7 Days ary Discharge Instructions: Apply silver alginate to wound bed as instructed Secondary Dressing: ABD Pad, 8x10 (Home Health) 1 x Per Week/7 Days Discharge Instructions: Apply over primary dressing as directed. Secondary Dressing: Zetuvit Plus 4x8 in (Home Health) 1 x Per Week/7 Days Discharge Instructions: or other super absorbent dressing Com pression Wrap: FourPress (4 layer compression wrap) 1 x Per Week/7 Days Discharge Instructions: Apply four layer compression as directed. May also use Miliken CoFlex 2 layer compression system as alternative. WOUND #3: - Calcaneus  Wound Laterality: Left Cleanser: Soap and Water (Home Health) 1 x Per Week/7 Days Discharge Instructions: May shower and wash wound with dial antibacterial soap and water  prior to dressing change. Cleanser: Wound Cleanser (Home Health) 1 x Per Week/7 Days Discharge Instructions: Cleanse the wound with wound cleanser or normal saline prior to applying a clean dressing using gauze sponges, not tissue or cotton balls. Peri-Wound Care: Triamcinolone 15 (g) 1 x Per Week/7 Days Discharge Instructions: Use triamcinolone mixed with lotion, in clinic only Peri-Wound Care: Sween Lotion (Moisturizing lotion) ([place]) 1 x Per Week/7 Days Discharge Instructions: Apply moisturizing lotion as directed Prim Dressing: KerraCel Ag Gelling Fiber Dressing, 4x5 in (silver alginate) (Home Health) 1 x Per Week/7 Days ary Discharge Instructions: Apply silver alginate to wound bed as instructed Secondary Dressing: Woven Gauze Sponge, Non-Sterile 4x4 in (Home Health) 1 x Per Week/7 Days Discharge Instructions: Apply over primary dressing as directed. Secondary Dressing: ALLEVYN Heel 4 1/2in x 5 1/2in / 10.5cm x 13.5cm (Home Health) 1 x Per Week/7 Days Discharge Instructions: Apply over primary dressing as directed. Com pression Wrap: FourPress (4 layer compression wrap) 1 x Per Week/7 Days Discharge Instructions: Apply four layer compression as directed. May also use [place] CoFlex 2 layer compression system as alternative. 1. After considerable discussion we elected to send her to the ER at [facility]. 2. The medical issue here iso Osteomyelitiso Soft tissue infection which will require IV antibiotics 3. Per our intake nurse the family is perturbed about this and other social issues which I can only imagine. 4. If we are successful in getting her admitted i.e. the EDP agrees that the patient has failed outpatient management and will require an MRI etc. then it would be wise to discharge her to a skilled  facility Electronic Signature(s) Signed: 02/11/2021 5:24:41 PM By: Linton Ham MD Entered By: Linton Ham on 02/11/2021 12:41:53 -------------------------------------------------------------------------------- SuperBill Details Patient Name: Date of Service: [name] Curio RES H. 02/11/2021 Medical Record Number: [id number] Patient Account Number: [id number] Date of Birth/Sex: Treating RN: 1951-03-05 (70 y.o. [name], Meta.Reding Primary Care Provider: Leslie Andrea Other Clinician: Referring Provider: Treating Provider/Extender: Oretha Ellis in Treatment: 2 Diagnosis Coding ICD-10 Codes Code Description [phone] Pressure ulcer of left heel, unstageable I89.0 Lymphedema, not elsewhere classified L97.828 Non-pressure chronic ulcer of other part of left lower leg with other specified severity L97.811 Non-pressure chronic ulcer of other part of right lower leg limited to breakdown of skin Facility Procedures CPT4 Code: [id number] Description: 62947 - DEB BONE 20 SQ CM/< ICD-10 Diagnosis Description L89.620 Pressure ulcer of left heel, unstageable Modifier: Quantity: 1 CPT4 Code: [id number] Description: (Facility Use Only) [id number] - APPLY MULTLAY COMPRS LWR RT LEG Modifier: 59 Quantity: 1 Physician Procedures CPT4: Description Modifier Code [id number] Debridement; bone (includes epidermis, dermis, subQ tissue, muscle and/or fascia, if performed) 1st 20 sqcm or less ICD-10 Diagnosis Description L89.620 Pressure ulcer of left heel, unstageable Quantity: 1 Electronic Signature(s) Signed: 02/11/2021 5:24:41 PM By: Linton Ham MD Signed: 02/11/2021 6:31:47 PM By: Deon Pilling Entered By: Deon Pilling on 02/11/2021 17:19:11

## 2021-02-11 NOTE — ED Notes (Signed)
Pt returned from MRI °

## 2021-02-11 NOTE — Progress Notes (Signed)
Pharmacy Antibiotic Note  [name] is a 70 y.o. female admitted on 02/11/2021 with  wound infection and possible osteomyelitis .  Pharmacy has been consulted for Vancomycin and Cefepime dosing.  Plan: Vancomycin 2000 mg IV x 1 dose. Vancomycin 1500 mg IV every 24 hours. Cefepime 2000 mg IV every 8 hours. Monitor labs, c/s, and vanco level as indicated.     Temp (24hrs), Avg:97.7 F (36.5 C), Min:97.7 F (36.5 C), Max:97.7 F (36.5 C)  Recent Labs  Lab 02/11/21 1432  WBC 10.9*  CREATININE 1.02*    CrCl cannot be calculated (Unknown ideal weight.).    No Known Allergies  Antimicrobials this admission: Vanco 7/21 >>  Cefepime 7/21 >>    Microbiology results: 7/21 BCx: pending  Thank you for allowing pharmacy to be a part of this patient's care.  Ramond Craver 02/11/2021 4:25 PM

## 2021-02-11 NOTE — ED Notes (Signed)
Pt here with reports of bilateral lower extremity chronic wounds/swelling and foot ulcer to heel of left foot and wound specialist concerned for worsening infection. Dressing removed from lower extremities, awaiting assessment by provider

## 2021-02-11 NOTE — ED Notes (Signed)
Ginger ale provided to patient per her request.

## 2021-02-11 NOTE — ED Notes (Signed)
Message sent to admitting doctor to request pain medication.

## 2021-02-11 NOTE — ED Triage Notes (Signed)
Bilateral leg pain , referred here for antibiotics from the wound center. Both legs are wrapped on arrival

## 2021-02-12 ENCOUNTER — Inpatient Hospital Stay (HOSPITAL_COMMUNITY): Payer: Medicare Other

## 2021-02-12 DIAGNOSIS — S91309A Unspecified open wound, unspecified foot, initial encounter: Secondary | ICD-10-CM | POA: Diagnosis present

## 2021-02-12 DIAGNOSIS — M86172 Other acute osteomyelitis, left ankle and foot: Secondary | ICD-10-CM

## 2021-02-12 DIAGNOSIS — G4733 Obstructive sleep apnea (adult) (pediatric): Secondary | ICD-10-CM

## 2021-02-12 DIAGNOSIS — Z659 Problem related to unspecified psychosocial circumstances: Secondary | ICD-10-CM

## 2021-02-12 DIAGNOSIS — G7000 Myasthenia gravis without (acute) exacerbation: Secondary | ICD-10-CM

## 2021-02-12 DIAGNOSIS — E119 Type 2 diabetes mellitus without complications: Secondary | ICD-10-CM

## 2021-02-12 DIAGNOSIS — E782 Mixed hyperlipidemia: Secondary | ICD-10-CM

## 2021-02-12 DIAGNOSIS — Z609 Problem related to social environment, unspecified: Secondary | ICD-10-CM

## 2021-02-12 DIAGNOSIS — E1159 Type 2 diabetes mellitus with other circulatory complications: Secondary | ICD-10-CM

## 2021-02-12 LAB — COMPREHENSIVE METABOLIC PANEL
ALT: 17 U/L (ref 0–44)
AST: 27 U/L (ref 15–41)
Albumin: 2.6 g/dL — ABNORMAL LOW (ref 3.5–5.0)
Alkaline Phosphatase: 74 U/L (ref 38–126)
Anion gap: 7 (ref 5–15)
BUN: 15 mg/dL (ref 8–23)
CO2: 20 mmol/L — ABNORMAL LOW (ref 22–32)
Calcium: 8.1 mg/dL — ABNORMAL LOW (ref 8.9–10.3)
Chloride: 108 mmol/L (ref 98–111)
Creatinine, Ser: 0.96 mg/dL (ref 0.44–1.00)
GFR, Estimated: >60 mL/min (ref >60)
Glucose, Bld: 93 mg/dL (ref 70–99)
Potassium: 4 mmol/L (ref 3.5–5.1)
Sodium: 135 mmol/L (ref 135–145)
Total Bilirubin: 0.6 mg/dL (ref 0.3–1.2)
Total Protein: 6 g/dL — ABNORMAL LOW (ref 6.5–8.1)

## 2021-02-12 LAB — CBC
HCT: 27.2 % — ABNORMAL LOW (ref 36.0–46.0)
Hemoglobin: 9.1 g/dL — ABNORMAL LOW (ref 12.0–15.0)
MCH: 31.5 pg (ref 26.0–34.0)
MCHC: 33.5 g/dL (ref 30.0–36.0)
MCV: 94.1 fL (ref 80.0–100.0)
Platelets: 347 10*3/uL (ref 150–400)
RBC: 2.89 MIL/uL — ABNORMAL LOW (ref 3.87–5.11)
RDW: 16.9 % — ABNORMAL HIGH (ref 11.5–15.5)
WBC: 10.4 10*3/uL (ref 4.0–10.5)
nRBC: 0 % (ref 0.0–0.2)

## 2021-02-12 LAB — PREALBUMIN: Prealbumin: 12.8 mg/dL — ABNORMAL LOW (ref 18–38)

## 2021-02-12 LAB — SEDIMENTATION RATE: Sed Rate: 45 mm/hr — ABNORMAL HIGH (ref 0–22)

## 2021-02-12 LAB — C-REACTIVE PROTEIN: CRP: 4.3 mg/dL — ABNORMAL HIGH (ref <1.0)

## 2021-02-12 LAB — CBG MONITORING, ED: Glucose-Capillary: 95 mg/dL (ref 70–99)

## 2021-02-12 LAB — MAGNESIUM: Magnesium: 1.9 mg/dL (ref 1.7–2.4)

## 2021-02-12 MED ORDER — GADOBUTROL 1 MMOL/ML IV SOLN
10.0000 mL | Freq: Once | INTRAVENOUS | Status: AC | PRN
  Administered 2021-02-12: 10 mL via INTRAVENOUS

## 2021-02-12 MED ORDER — COLLAGENASE 250 UNIT/GM EX OINT
TOPICAL_OINTMENT | Freq: Two times a day (BID) | CUTANEOUS | Status: DC
  Administered 2021-02-15 – 2021-02-18 (×2): 1 via TOPICAL
  Filled 2021-02-12 (×3): qty 30

## 2021-02-12 MED ORDER — ENSURE ENLIVE PO LIQD
237.0000 mL | Freq: Two times a day (BID) | ORAL | Status: DC
  Administered 2021-02-13 – 2021-02-23 (×15): 237 mL via ORAL
  Filled 2021-02-12: qty 237

## 2021-02-12 NOTE — Plan of Care (Signed)
  Problem: Acute Rehab PT Goals(only PT should resolve) Goal: Pt Will Go Supine/Side To Sit Outcome: Progressing Flowsheets (Taken 02/12/2021 1528) Pt will go Supine/Side to Sit: with supervision Goal: Patient Will Transfer Sit To/From Stand Outcome: Progressing Flowsheets (Taken 02/12/2021 1528) Patient will transfer sit to/from stand:  with supervision  with min guard assist Goal: Pt Will Transfer Bed To Chair/Chair To Bed Outcome: Progressing Flowsheets (Taken 02/12/2021 1528) Pt will Transfer Bed to Chair/Chair to Bed:  with supervision  min guard assist Goal: Pt Will Ambulate Outcome: Progressing Flowsheets (Taken 02/12/2021 1528) Pt will Ambulate:  100 feet  with supervision  with rolling walker   3:29 PM, 02/12/21 Lonell Grandchild, MPT Physical Therapist with [facility] [phone] office [phone] mobile phone

## 2021-02-12 NOTE — Progress Notes (Signed)
Received pt from [facility] to 4E17. VSS, CHG bath given. L heel wound 1.5cmx1.5cm and dressed per orders. 8/10 pain, given PRN after dressing applied. Offered to updated daughter for patient, she declined. Oriented to room and call light. Will continue to monitor.  Jaymes Graff, RN

## 2021-02-12 NOTE — ED Notes (Signed)
Report called to RN patricia. Pt awaits transport

## 2021-02-12 NOTE — H&P (Signed)
TRH H&P    Patient Demographics:    [name], is a 70 y.o. female  MRN: [id number]  DOB - 15-Jun-1951  Admit Date - 02/11/2021  Referring MD/NP/PA: Gilford Raid  Outpatient Primary MD for the patient is Leslie Andrea, MD  Patient coming from: Home  Chief complaint- Left foot pain and swelling   HPI:    [name]  is a 70 y.o. female,with history of anxiety/depression, GERD, HTN, HLD, OSA/obesity hypoventilation syndrome, and pre-diabetes presents to the ED with a chief complaint of increase in left foot pain and swelling.  Patient reports that she has been following with wound care in [facility] for 4 months for a lower extremity wound on the left side.  She reports she has been on Cipro for 2 weeks.  The last 1 month she has had an increase in her pain and swelling in her left foot.  She reports that she has taken some Tylenol and it has not helped.  She does report drainage from the wound but she is unsure what color the drainage is.  She reports there was bleeding in the wound today.  Today when she went to wound care the provider there probed the wound.  He advised her to come into the hospital as previously when he had probe he could not reach bone and there was no purulence.  Today when he probed the wound he reported to the ED staff that he went all the way to bone, and had purulent drainage on the probe afterwards.  Provider is also worried the patient is no longer able to take care of her self at home.  She does not check her sugars at home as she reports that she is prediabetic.  She reports that the only time she had to be on metformin/diabetic medications was when she had hyperglycemia induced by prednisone.  Patient denies any fevers but does report chills.  She has been nauseous but not had any episodes of vomiting.  She reports 2 loose stools yesterday, no melena or hematochezia.  She reports  that she does not smoke, does not drink alcohol, does not use illicit drugs.  She has had 2 shots for COVID.  She is full code.   In the ED Patient is afebrile, heart rate is stable, respiratory rate is stable, blood pressure is stable, satting 100% No leukocytosis, hemoglobin is 10.0 Chemistry panel is unremarkable Negative for COVID Blood cultures pending MR foot does not show osteomyelitis but does show possible infarct of the bone, and recommends MR ankle which is ordered for the morning Patient was started on vancomycin and cefepime    Review of systems:    In addition to the HPI above,  No Fever-does admit to chills No Headache, No changes with Vision or hearing, No problems swallowing food or Liquids, No Chest pain, Cough or Shortness of Breath, No Abdominal pain, reports nausea but no vomiting, bowel movements are regular, No Blood in stool or Urine, No dysuria, Reports foot swelling, wound, drainage from wound No new weakness, tingling,  numbness in any extremity, No recent weight gain or loss, No polyuria, polydypsia or polyphagia, No significant Mental Stressors.  All other systems reviewed and are negative.    Past History of the following :    Past Medical History:  Diagnosis Date   Achilles tendinitis    Anxiety    Asthma    Depression    GERD (gastroesophageal reflux disease)    Gout    H/O myasthenia gravis    left eye   HTN (hypertension)    Hyperlipidemia    Low back pain    Obesity hypoventilation syndrome ([place])    Obstructive sleep apnea    Osteoarthritis       Past Surgical History:  Procedure Laterality Date   CATARACT EXTRACTION W/PHACO Left 09/17/2015   Procedure: CATARACT EXTRACTION PHACO AND INTRAOCULAR LENS PLACEMENT LEFT EYE cde=8.58;  Surgeon: Tonny Branch, MD;  Location: [facility];  Service: Ophthalmology;  Laterality: Left;   CATARACT EXTRACTION W/PHACO Right 05/15/2017   Procedure: CATARACT EXTRACTION PHACO AND INTRAOCULAR LENS  PLACEMENT (IOC);  Surgeon: Tonny Branch, MD;  Location: [facility];  Service: Ophthalmology;  Laterality: Right;  CDE: 6.34   COLONOSCOPY N/A 12/25/2013   Procedure: COLONOSCOPY;  Surgeon: Rogene Houston, MD;  Location: [facility];  Service: Endoscopy;  Laterality: N/A;  730-rescheduled Ann notified pt   COLONOSCOPY WITH PROPOFOL N/A 10/04/2019   Procedure: COLONOSCOPY WITH PROPOFOL;  Surgeon: Rogene Houston, MD;  Location: [facility];  Service: Endoscopy;  Laterality: N/A;  815   CYST EXCISION N/A 09/12/2016   Procedure: EXCISION SEBACEOUS CYST, BACK;  Surgeon: Aviva Signs, MD;  Location: [facility];  Service: General;  Laterality: N/A;   INTRAOCULAR LENS INSERTION Bilateral 2018   Dr. Tonny Branch    POLYPECTOMY  10/04/2019   Procedure: POLYPECTOMY;  Surgeon: Rogene Houston, MD;  Location: [facility];  Service: Endoscopy;;   TUBAL LIGATION        Social History:      Social History   Tobacco Use   Smoking status: Former    Packs/day: 2.00    Years: 36.00    Pack years: 72.00    Types: Cigarettes    Start date: 06/25/1969    Quit date: 03/25/2005    Years since quitting: 15.8   Smokeless tobacco: Never  Substance Use Topics   Alcohol use: No    Alcohol/week: 0.0 standard drinks       Family History :     Family History  Problem Relation Age of Onset   Heart disease Mother    Thyroid disease Mother    Heart failure Father    Lung disease Father    Diabetes Brother    Crohn's disease Daughter       Home Medications:   Prior to Admission medications   Medication Sig Start Date End Date Taking? Authorizing Provider  albuterol (VENTOLIN HFA) 108 (90 Base) MCG/ACT inhaler INHALE 2 PUFFS INTO THE LUNGS EVERY 4 HOURS AS NEEDED FOR WHEEZING OR SHORTNESS OF BREATH Patient taking differently: Inhale 2 puffs into the lungs every 4 (four) hours as needed for wheezing or shortness of breath. 01/23/20  Yes [place], Modena Nunnery, MD  allopurinol (ZYLOPRIM) 100 MG tablet TAKE 1  TABLET(100 MG) BY MOUTH DAILY Patient taking differently: Take 100 mg by mouth daily. 02/28/20  Yes [place], Modena Nunnery, MD  aspirin EC 81 MG tablet Take 1 tablet (81 mg total) by mouth every evening. 10/05/19  Yes Rehman,  Mechele Dawley, MD  atorvastatin (LIPITOR) 40 MG tablet TAKE 1 TABLET(40 MG) BY MOUTH DAILY AT 6 PM Patient taking differently: Take 40 mg by mouth daily. 08/31/20  Yes [place], Modena Nunnery, MD  busPIRone (BUSPAR) 5 MG tablet TAKE 1 TABLET(5 MG) BY MOUTH TWICE DAILY FOR ANXIETY Patient taking differently: Take 5 mg by mouth 2 (two) times daily. 10/16/20  Yes [place], Modena Nunnery, MD  furosemide (LASIX) 40 MG tablet TAKE 1 TABLET(40 MG) BY MOUTH DAILY Patient taking differently: Take 40 mg by mouth daily. 09/08/20  Yes [place], Modena Nunnery, MD  HYDROcodone-acetaminophen (NORCO) 7.5-325 MG tablet Take 1 tablet by mouth 2 (two) times daily as needed for moderate pain. Patient taking differently: Take 1 tablet by mouth in the morning and at bedtime. 09/30/20  Yes [place], Modena Nunnery, MD  Magnesium 250 MG TABS Take 1 tablet (250 mg total) by mouth daily. Patient taking differently: Take 250 mg by mouth daily in the afternoon. Midday 08/16/18  Yes [place], Modena Nunnery, MD  omeprazole (PRILOSEC) 40 MG capsule TAKE 1 CAPSULE(40 MG) BY MOUTH DAILY Patient taking differently: Take 40 mg by mouth daily. 06/29/20  Yes [place], Modena Nunnery, MD  OXYGEN Inhale 1 L into the lungs at bedtime. IN ADDITION TO CPAP NIGHTLY   Yes [provider]  polyethylene glycol powder (MIRALAX) 17 GM/SCOOP powder Mix 1 scoop (17 grams) of powder in water daily to help with constipation. Patient taking differently: Take 17 g by mouth daily as needed for mild constipation or moderate constipation. 11/08/20  Yes [place], Margaux, PA-C  potassium chloride (KLOR-CON) 10 MEQ tablet TAKE 1 TABLET BY MOUTH DAILY WITH FLUID PILL Patient taking differently: Take 10 mEq by mouth daily. With fluid pill 09/01/20  Yes [place], Modena Nunnery, MD   pyridostigmine (MESTINON) 60 MG tablet Take 1 tablet (60 mg total) by mouth 3 (three) times daily. 12/01/20  Yes [place], Earlean Polka, MD  traZODone (DESYREL) 100 MG tablet TAKE 1 TABLET BY MOUTH AT BEDTIME AS NEEDED FOR SLEEP Patient taking differently: Take 100 mg by mouth at bedtime as needed for sleep. 07/07/20  Yes , Modena Nunnery, MD  ciprofloxacin (CIPRO) 500 MG tablet Take 500 mg by mouth 2 (two) times daily. 7 day course starting on 02/02/2021 02/02/21   [provider]  clotrimazole (LOTRIMIN) 1 % cream Apply 1 application topically 2 (two) times daily. Patient not taking: Reported on 02/11/2021 01/08/20   Alycia Rossetti, MD  diphenhydrAMINE (BENADRYL) 25 MG tablet Take 25 mg by mouth daily.    [provider]  Glucose Blood (BLOOD GLUCOSE TEST STRIPS) STRP Use as directed to monitor FSBS 3x weekly. Dx: R73.09. Patient not taking: Reported on 02/11/2021 05/25/20   Alycia Rossetti, MD  metFORMIN (GLUCOPHAGE) 500 MG tablet Take 1 tablet (500 mg total) by mouth 2 (two) times daily with a meal. Patient not taking: Reported on 02/11/2021 08/12/20   Alycia Rossetti, MD  nystatin (MYCOSTATIN/NYSTOP) powder Apply 1 application topically 3 (three) times daily. Patient not taking: No sig reported 01/08/20   Alycia Rossetti, MD  predniSONE (DELTASONE) 10 MG tablet Take 2 tablets (20 mg total) by mouth daily. Patient not taking: No sig reported 12/01/20   [place], Earlean Polka, MD     Allergies:    No Known Allergies   Physical Exam:   Vitals  Blood pressure (!) 125/52, pulse 83, temperature (!) 97.4 F (36.3 C), temperature source Oral, resp. rate 15, height 5\' 5"  (1.651 m),  weight 108.4 kg, SpO2 99 %.  1.  General: Patient lying supine in bed,  no acute distress   2. Psychiatric: Alert and oriented x 3, mood and behavior normal for situation, pleasant and cooperative with exam   3. Neurologic: Speech and language are normal, face is symmetric, moves all 4  extremities voluntarily, at baseline without acute deficits on limited exam   4. HEENMT:  Head is atraumatic, normocephalic, pupils reactive to light, neck is supple, trachea is midline, mucous membranes are moist   5. Respiratory : Lungs are clear to auscultation bilaterally without wheezing, rhonchi, rales, no cyanosis, no increase in work of breathing or accessory muscle use   6. Cardiovascular : Heart rate normal, rhythm is regular, no murmurs, rubs or gallops, peripheral pulses palpated   7. Gastrointestinal:  Abdomen is soft, nondistended, nontender to palpation bowel sounds active, no masses or organomegaly palpated   8. Skin:  Skin is warm, dry and intact without rashes, acute lesions, or ulcers on limited exam   9.Musculoskeletal:   Left foot swelling, erythema, tenderness around a dime sized wound on the plantar aspect of the foot, bilateral peripheral edema    Data Review:    CBC Recent Labs  Lab 02/11/21 1432  WBC 10.9*  HGB 10.0*  HCT 29.6*  PLT 378  MCV 93.1  MCH 31.4  MCHC 33.8  RDW 16.8*  LYMPHSABS 1.6  MONOABS 1.2*  EOSABS 0.2  BASOSABS 0.1   ------------------------------------------------------------------------------------------------------------------  Results for orders placed or performed during the hospital encounter of 02/11/21 (from the past 48 hour(s))  Basic metabolic panel     Status: Abnormal   Collection Time: 02/11/21  2:32 PM  Result Value Ref Range   Sodium 134 (L) 135 - 145 mmol/L   Potassium 4.3 3.5 - 5.1 mmol/L   Chloride 104 98 - 111 mmol/L   CO2 22 22 - 32 mmol/L   Glucose, Bld 98 70 - 99 mg/dL    Comment: Glucose reference range applies only to samples taken after fasting for at least 8 hours.   BUN 16 8 - 23 mg/dL   Creatinine, Ser 1.02 (H) 0.44 - 1.00 mg/dL   Calcium 8.2 (L) 8.9 - 10.3 mg/dL   GFR, Estimated 59 (L) >60 mL/min    Comment: (NOTE) Calculated using the CKD-EPI Creatinine Equation (2021)    Anion gap 8 5  - 15    Comment: Performed at [facility], [address] [place], [place] [address]  CBC with Differential     Status: Abnormal   Collection Time: 02/11/21  2:32 PM  Result Value Ref Range   WBC 10.9 (H) 4.0 - 10.5 K/uL   RBC 3.18 (L) 3.87 - 5.11 MIL/uL   Hemoglobin 10.0 (L) 12.0 - 15.0 g/dL   HCT 29.6 (L) 36.0 - 46.0 %   MCV 93.1 80.0 - 100.0 fL   MCH 31.4 26.0 - 34.0 pg   MCHC 33.8 30.0 - 36.0 g/dL   RDW 16.8 (H) 11.5 - 15.5 %   Platelets 378 150 - 400 K/uL   nRBC 0.0 0.0 - 0.2 %   Neutrophils Relative % 71 %   Neutro Abs 7.7 1.7 - 7.7 K/uL   Lymphocytes Relative 14 %   Lymphs Abs 1.6 0.7 - 4.0 K/uL   Monocytes Relative 11 %   Monocytes Absolute 1.2 (H) 0.1 - 1.0 K/uL   Eosinophils Relative 2 %   Eosinophils Absolute 0.2 0.0 - 0.5 K/uL   Basophils  Relative 1 %   Basophils Absolute 0.1 0.0 - 0.1 K/uL   Immature Granulocytes 1 %   Abs Immature Granulocytes 0.15 (H) 0.00 - 0.07 K/uL    Comment: Performed at [facility], [address] [place], [place] [address]  Magnesium     Status: Abnormal   Collection Time: 02/11/21  2:32 PM  Result Value Ref Range   Magnesium 1.3 (L) 1.7 - 2.4 mg/dL    Comment: Performed at [facility], [address], [place], [place] [address]  Culture, blood (routine x 2)     Status: None (Preliminary result)   Collection Time: 02/11/21  2:37 PM   Specimen: Right Antecubital; Blood  Result Value Ref Range   Specimen Description      RIGHT ANTECUBITAL BOTTLES DRAWN AEROBIC AND ANAEROBIC   Special Requests      Blood Culture adequate volume Performed at [facility], [address], [place], [place] [address]    Culture PENDING    Report Status PENDING   Resp Panel by RT-PCR (Flu A&B, Covid) Nasopharyngeal Swab     Status: None   Collection Time: 02/11/21  3:31 PM   Specimen: Nasopharyngeal Swab; Nasopharyngeal(NP) swabs in vial transport medium  Result Value Ref Range   SARS Coronavirus 2 by RT PCR NEGATIVE NEGATIVE    Comment:  (NOTE) SARS-CoV-2 target nucleic acids are NOT DETECTED.  The SARS-CoV-2 RNA is generally detectable in upper respiratory specimens during the acute phase of infection. The lowest concentration of SARS-CoV-2 viral copies this assay can detect is 138 copies/mL. A negative result does not preclude SARS-Cov-2 infection and should not be used as the sole basis for treatment or other patient management decisions. A negative result may occur with  improper specimen collection/handling, submission of specimen other than nasopharyngeal swab, presence of viral mutation(s) within the areas targeted by this assay, and inadequate number of viral copies(<138 copies/mL). A negative result must be combined with clinical observations, patient history, and epidemiological information. The expected result is Negative.  Fact Sheet for Patients:  EntrepreneurPulse.com.au  Fact Sheet for Healthcare Providers:  IncredibleEmployment.be  This test is no t yet approved or cleared by the [place] FDA and  has been authorized for detection and/or diagnosis of SARS-CoV-2 by FDA under an Emergency Use Authorization (EUA). This EUA will remain  in effect (meaning this test can be used) for the duration of the COVID-19 declaration under Section 564(b)(1) of the Act, 21 U.S.C.section 360bbb-3(b)(1), unless the authorization is terminated  or revoked sooner.       Influenza A by PCR NEGATIVE NEGATIVE   Influenza B by PCR NEGATIVE NEGATIVE    Comment: (NOTE) The Xpert Xpress SARS-CoV-2/FLU/RSV plus assay is intended as an aid in the diagnosis of influenza from Nasopharyngeal swab specimens and should not be used as a sole basis for treatment. Nasal washings and aspirates are unacceptable for Xpert Xpress SARS-CoV-2/FLU/RSV testing.  Fact Sheet for Patients: EntrepreneurPulse.com.au  Fact Sheet for Healthcare  Providers: IncredibleEmployment.be  This test is not yet approved or cleared by the [place] FDA and has been authorized for detection and/or diagnosis of SARS-CoV-2 by FDA under an Emergency Use Authorization (EUA). This EUA will remain in effect (meaning this test can be used) for the duration of the COVID-19 declaration under Section 564(b)(1) of the Act, 21 U.S.C. section 360bbb-3(b)(1), unless the authorization is terminated or revoked.  Performed at [facility], [address] [place], [place] [address]   Culture, blood (routine x  2)     Status: None (Preliminary result)   Collection Time: 02/11/21  3:54 PM   Specimen: Left Antecubital; Blood  Result Value Ref Range   Specimen Description      LEFT ANTECUBITAL BOTTLES DRAWN AEROBIC AND ANAEROBIC   Special Requests      Blood Culture adequate volume Performed at [facility], [address], [place], [place] [address]    Culture PENDING    Report Status PENDING     Chemistries  Recent Labs  Lab 02/11/21 1432  NA 134*  K 4.3  CL 104  CO2 22  GLUCOSE 98  BUN 16  CREATININE 1.02*  CALCIUM 8.2*  MG 1.3*   ------------------------------------------------------------------------------------------------------------------  ------------------------------------------------------------------------------------------------------------------ GFR: Estimated Creatinine Clearance: 62 mL/min (A) (by C-G formula based on SCr of 1.02 mg/dL (H)). Liver Function Tests: No results for input(s): AST, ALT, ALKPHOS, BILITOT, PROT, ALBUMIN in the last 168 hours. No results for input(s): LIPASE, AMYLASE in the last 168 hours. No results for input(s): AMMONIA in the last 168 hours. Coagulation Profile: No results for input(s): INR, PROTIME in the last 168 hours. Cardiac Enzymes: No results for input(s): CKTOTAL, CKMB, CKMBINDEX, TROPONINI in the last 168 hours. BNP (last 3 results) No results for input(s): PROBNP  in the last 8760 hours. HbA1C: No results for input(s): HGBA1C in the last 72 hours. CBG: No results for input(s): GLUCAP in the last 168 hours. Lipid Profile: No results for input(s): CHOL, HDL, LDLCALC, TRIG, CHOLHDL, LDLDIRECT in the last 72 hours. Thyroid Function Tests: No results for input(s): TSH, T4TOTAL, FREET4, T3FREE, THYROIDAB in the last 72 hours. Anemia Panel: No results for input(s): VITAMINB12, FOLATE, FERRITIN, TIBC, IRON, RETICCTPCT in the last 72 hours.  --------------------------------------------------------------------------------------------------------------- Urine analysis:    Component Value Date/Time   COLORURINE YELLOW 10/03/2020 1646   APPEARANCEUR HAZY (A) 10/03/2020 1646   LABSPEC 1.010 10/03/2020 1646   PHURINE 5.0 10/03/2020 1646   GLUCOSEU NEGATIVE 10/03/2020 1646   HGBUR NEGATIVE 10/03/2020 1646   BILIRUBINUR NEGATIVE 10/03/2020 1646   KETONESUR NEGATIVE 10/03/2020 1646   PROTEINUR NEGATIVE 10/03/2020 1646   NITRITE NEGATIVE 10/03/2020 1646   LEUKOCYTESUR NEGATIVE 10/03/2020 1646      Imaging Results:    MR FOOT LEFT W WO CONTRAST  Result Date: 02/11/2021 CLINICAL DATA:  Foot swelling, diabetic, osteomyelitis suspected, xray done EXAM: MRI OF THE LEFT FOREFOOT WITHOUT AND WITH CONTRAST TECHNIQUE: Multiplanar, multisequence MR imaging of the left forefoot was performed both before and after administration of intravenous contrast. CONTRAST:  66mL GADAVIST GADOBUTROL 1 MMOL/ML IV SOLN COMPARISON:  Left foot radiograph 01/28/2021 FINDINGS: Bones/Joint/Cartilage There is a serpiginous lesion in the calcaneus posteriorly with preserved internal fat and a rim of T2 hyperintensity. There is mild fifth MTP arthritic change. There is mild linear enhancement and low T1 signal along the plantar aspect of the calcaneus near the plantar fascia insertion, partially visualized in a single plane. There is a partially visualized possible soft tissue ulcer along the  plantar heel obscured by wraparound artifact on axial T1 image 39. In the forefoot, there is no evidence of osteomyelitis. Ligaments Intact Lisfranc ligament. Muscles and Tendons There is mild muscle atrophy with intramuscular edema commonly seen in diabetes. There is no evidence of acute tendon tear. Soft tissues There is extensive soft tissue swelling. There is focal soft tissue inflammatory change with edema and enhancement along the soft tissues adjacent to the distal phalanx of the fifth toe. IMPRESSION: Extensive soft tissue swelling and focal soft  tissue inflammatory change at the tip of the fifth toe. No evidence of osteomyelitis in the forefoot. Partially visualized hindfoot with likely bone infarct in the calcaneus. There is linear low T1 signal and enhancement along the plantar aspect of the calcaneus near the plantar fascia insertion and possible overlying soft tissue ulcer along the plantar heel, partially visualized only on axial images. Consider dedicated ankle MRI to further evaluate for hindfoot infection. Electronically Signed   By: Maurine Simmering   On: 02/11/2021 18:49       Assessment & Plan:    Active Problems:   HLD (hyperlipidemia)   Osteomyelitis (HCC)   Hypomagnesemia   Social problem   Osteomyelitis Wound clinic was able to probe to bone Initial MRI doesn't fully visualize the area Repeat MRI in the AM Continue broad spectrum antibiotics, as patient has been on cipro in the outpatient setting Consult gen surg; NPO after midnight Continue to monitor Hypomagnesemia Magnesium 1.3 2g IV mag ordered Recheck in the AM Pre-diabetes Check Hgb A1C Monitor CBGs Carb modified diet HLD Continue Statin Social problem Wound care provider concerned that patient can no longer take care of herself at home [facility] consult PT/OT eval    DVT Prophylaxis-   Heparin - SCDs   AM Labs Ordered, also please review Full Orders  Family Communication: No family at bedside.  Code  Status:  Full  Admission status: Inpatient :The appropriate admission status for this patient is INPATIENT. Inpatient status is judged to be reasonable and necessary in order to provide the required intensity of service to ensure the patient's safety. The patient's presenting symptoms, physical exam findings, and initial radiographic and laboratory data in the context of their chronic comorbidities is felt to place them at high risk for further clinical deterioration. Furthermore, it is not anticipated that the patient will be medically stable for discharge from the hospital within 2 midnights of admission. The following factors support the admission status of inpatient.     The patient's presenting symptoms include Left foot pain and swelling. The worrisome physical exam findings include open lower extremity wound with purulence. The initial radiographic and laboratory data are worrisome because of possible bone infarct. The chronic co-morbidities include prediabetes (per patient), HLD, HTN, OSA.       * I certify that at the point of admission it is my clinical judgment that the patient will require inpatient hospital care spanning beyond 2 midnights from the point of admission due to high intensity of service, high risk for further deterioration and high frequency of surveillance required.*  Time spent in minutes : [place]

## 2021-02-12 NOTE — Consult Note (Signed)
[facility] Consult  Reason for Consult: Left heel wound  Referring Physician: Dr. Roderic Palau   Chief Complaint   Leg Pain     HPI: [name] is a 70 y.o. female with HLD, MG, GERD, OSA and hypoventilation syndrome who lives at home and has prediabetes. She has had left foot pain and swelling. She denies any trauma or stepping on anything. She has been follow with a wound center for a few months and had been on antibiotics.  She has had some increased pain and swelling in the last few days.  The wound care provider probed the wound and reported getting down to bone with purulence.  She has not had any fevers and has had some drainage and bloody.    Past Medical History:  Diagnosis Date   Achilles tendinitis    Anxiety    Asthma    Depression    GERD (gastroesophageal reflux disease)    Gout    H/O myasthenia gravis    left eye   HTN (hypertension)    Hyperlipidemia    Low back pain    Obesity hypoventilation syndrome ([place])    Obstructive sleep apnea    Osteoarthritis     Past Surgical History:  Procedure Laterality Date   CATARACT EXTRACTION W/PHACO Left 09/17/2015   Procedure: CATARACT EXTRACTION PHACO AND INTRAOCULAR LENS PLACEMENT LEFT EYE cde=8.58;  Surgeon: Tonny Branch, MD;  Location: [facility];  Service: Ophthalmology;  Laterality: Left;   CATARACT EXTRACTION W/PHACO Right 05/15/2017   Procedure: CATARACT EXTRACTION PHACO AND INTRAOCULAR LENS PLACEMENT (IOC);  Surgeon: Tonny Branch, MD;  Location: [facility];  Service: Ophthalmology;  Laterality: Right;  CDE: 6.34   COLONOSCOPY N/A 12/25/2013   Procedure: COLONOSCOPY;  Surgeon: Rogene Houston, MD;  Location: [facility];  Service: Endoscopy;  Laterality: N/A;  730-rescheduled Ann notified pt   COLONOSCOPY WITH PROPOFOL N/A 10/04/2019   Procedure: COLONOSCOPY WITH PROPOFOL;  Surgeon: Rogene Houston, MD;  Location: [facility];  Service: Endoscopy;  Laterality: N/A;  815   CYST EXCISION N/A 09/12/2016    Procedure: EXCISION SEBACEOUS CYST, BACK;  Surgeon: Aviva Signs, MD;  Location: [facility];  Service: General;  Laterality: N/A;   INTRAOCULAR LENS INSERTION Bilateral 2018   Dr. Tonny Branch    POLYPECTOMY  10/04/2019   Procedure: POLYPECTOMY;  Surgeon: Rogene Houston, MD;  Location: [facility];  Service: Endoscopy;;   TUBAL LIGATION      Family History  Problem Relation Age of Onset   Heart disease Mother    Thyroid disease Mother    Heart failure Father    Lung disease Father    Diabetes Brother    Crohn's disease Daughter     Social History   Tobacco Use   Smoking status: Former    Packs/day: 2.00    Years: 36.00    Pack years: 72.00    Types: Cigarettes    Start date: 06/25/1969    Quit date: 03/25/2005    Years since quitting: 15.8   Smokeless tobacco: Never  Vaping Use   Vaping Use: Never used  Substance Use Topics   Alcohol use: No    Alcohol/week: 0.0 standard drinks   Drug use: No    Medications: I have reviewed the patient's current medications. Prior to Admission: (Not in a hospital admission)  Scheduled:  aspirin EC  81 mg Oral QPM   atorvastatin  40 mg Oral Daily   busPIRone  5  mg Oral BID   collagenase   Topical BID   furosemide  40 mg Oral Daily   heparin  5,000 Units Subcutaneous Q8H   pantoprazole  40 mg Oral Daily   pyridostigmine  60 mg Oral TID   Continuous:  ceFEPime (MAXIPIME) IV Stopped (02/12/21 1624)   vancomycin     DXI:[id number] **OR** acetaminophen, albuterol, HYDROcodone-acetaminophen, morphine injection, ondansetron **OR** ondansetron (ZOFRAN) IV, polyethylene glycol, traZODone  No Known Allergies   ROS:  Musculoskeletal:negative, left foot pain and swelling, drainage  Blood pressure (!) 128/49, pulse 93, temperature (!) 97.5 F (36.4 C), temperature source Oral, resp. rate 18, height 5\' 5"  (1.651 m), weight 108.4 kg, SpO2 99 %. Physical Exam Vitals reviewed.  Constitutional:      Appearance: She is obese.  HENT:      Head: Normocephalic.     Nose: Nose normal.     Mouth/Throat:     Mouth: Mucous membranes are moist.  Eyes:     Extraocular Movements: Extraocular movements intact.  Cardiovascular:     Rate and Rhythm: Normal rate.     Pulses:          Dorsalis pedis pulses are 0 on the right side and 0 on the left side.  Pulmonary:     Effort: Pulmonary effort is normal.  Abdominal:     General: There is no distension.     Palpations: Abdomen is soft.     Tenderness: There is no abdominal tenderness.  Musculoskeletal:     Right lower leg: Edema present.     Left lower leg: Edema present.     Comments: Left heel plantar wound 1.5cm in size, 1cm deep, necrotic fibrinous base, tender, no purulence expressed, minimal erythema, no palpable DP bilaterally   Skin:    General: Skin is warm.  Neurological:     General: No focal deficit present.     Mental Status: She is alert and oriented to person, place, and time.  Psychiatric:        Mood and Affect: Mood normal.        Behavior: Behavior normal.       Results: Results for orders placed or performed during the hospital encounter of 02/11/21 (from the past 48 hour(s))  Basic metabolic panel     Status: Abnormal   Collection Time: 02/11/21  2:32 PM  Result Value Ref Range   Sodium 134 (L) 135 - 145 mmol/L   Potassium 4.3 3.5 - 5.1 mmol/L   Chloride 104 98 - 111 mmol/L   CO2 22 22 - 32 mmol/L   Glucose, Bld 98 70 - 99 mg/dL    Comment: Glucose reference range applies only to samples taken after fasting for at least 8 hours.   BUN 16 8 - 23 mg/dL   Creatinine, Ser 1.02 (H) 0.44 - 1.00 mg/dL   Calcium 8.2 (L) 8.9 - 10.3 mg/dL   GFR, Estimated 59 (L) >60 mL/min    Comment: (NOTE) Calculated using the CKD-EPI Creatinine Equation (2021)    Anion gap 8 5 - 15    Comment: Performed at [facility], [address] [place], [place] [address]  CBC with Differential     Status: Abnormal   Collection Time: 02/11/21  2:32 PM  Result Value Ref Range    WBC 10.9 (H) 4.0 - 10.5 K/uL   RBC 3.18 (L) 3.87 - 5.11 MIL/uL   Hemoglobin 10.0 (L) 12.0 - 15.0 g/dL   HCT 29.6 (L) 36.0 -  46.0 %   MCV 93.1 80.0 - 100.0 fL   MCH 31.4 26.0 - 34.0 pg   MCHC 33.8 30.0 - 36.0 g/dL   RDW 16.8 (H) 11.5 - 15.5 %   Platelets 378 150 - 400 K/uL   nRBC 0.0 0.0 - 0.2 %   Neutrophils Relative % 71 %   Neutro Abs 7.7 1.7 - 7.7 K/uL   Lymphocytes Relative 14 %   Lymphs Abs 1.6 0.7 - 4.0 K/uL   Monocytes Relative 11 %   Monocytes Absolute 1.2 (H) 0.1 - 1.0 K/uL   Eosinophils Relative 2 %   Eosinophils Absolute 0.2 0.0 - 0.5 K/uL   Basophils Relative 1 %   Basophils Absolute 0.1 0.0 - 0.1 K/uL   Immature Granulocytes 1 %   Abs Immature Granulocytes 0.15 (H) 0.00 - 0.07 K/uL    Comment: Performed at [facility], [address] [place], [place] [address]  Magnesium     Status: Abnormal   Collection Time: 02/11/21  2:32 PM  Result Value Ref Range   Magnesium 1.3 (L) 1.7 - 2.4 mg/dL    Comment: Performed at [facility], [address], [place], [place] [address]  Culture, blood (routine x 2)     Status: None (Preliminary result)   Collection Time: 02/11/21  2:37 PM   Specimen: Right Antecubital; Blood  Result Value Ref Range   Specimen Description      RIGHT ANTECUBITAL BOTTLES DRAWN AEROBIC AND ANAEROBIC   Special Requests Blood Culture adequate volume    Culture      NO GROWTH < 12 HOURS Performed at [facility], [address] [place], [place] [address]    Report Status PENDING   Resp Panel by RT-PCR (Flu A&B, Covid) Nasopharyngeal Swab     Status: None   Collection Time: 02/11/21  3:31 PM   Specimen: Nasopharyngeal Swab; Nasopharyngeal(NP) swabs in vial transport medium  Result Value Ref Range   SARS Coronavirus 2 by RT PCR NEGATIVE NEGATIVE    Comment: (NOTE) SARS-CoV-2 target nucleic acids are NOT DETECTED.  The SARS-CoV-2 RNA is generally detectable in upper respiratory specimens during the acute phase of infection. The  lowest concentration of SARS-CoV-2 viral copies this assay can detect is 138 copies/mL. A negative result does not preclude SARS-Cov-2 infection and should not be used as the sole basis for treatment or other patient management decisions. A negative result may occur with  improper specimen collection/handling, submission of specimen other than nasopharyngeal swab, presence of viral mutation(s) within the areas targeted by this assay, and inadequate number of viral copies(<138 copies/mL). A negative result must be combined with clinical observations, patient history, and epidemiological information. The expected result is Negative.  Fact Sheet for Patients:  EntrepreneurPulse.com.au  Fact Sheet for Healthcare Providers:  IncredibleEmployment.be  This test is no t yet approved or cleared by the [place] FDA and  has been authorized for detection and/or diagnosis of SARS-CoV-2 by FDA under an Emergency Use Authorization (EUA). This EUA will remain  in effect (meaning this test can be used) for the duration of the COVID-19 declaration under Section 564(b)(1) of the Act, 21 U.S.C.section 360bbb-3(b)(1), unless the authorization is terminated  or revoked sooner.       Influenza A by PCR NEGATIVE NEGATIVE   Influenza B by PCR NEGATIVE NEGATIVE    Comment: (NOTE) The Xpert Xpress SARS-CoV-2/FLU/RSV plus assay is intended as an aid in the diagnosis of influenza from Nasopharyngeal swab specimens and should  not be used as a sole basis for treatment. Nasal washings and aspirates are unacceptable for Xpert Xpress SARS-CoV-2/FLU/RSV testing.  Fact Sheet for Patients: EntrepreneurPulse.com.au  Fact Sheet for Healthcare Providers: IncredibleEmployment.be  This test is not yet approved or cleared by the [place] FDA and has been authorized for detection and/or diagnosis of SARS-CoV-2 by FDA under an Emergency  Use Authorization (EUA). This EUA will remain in effect (meaning this test can be used) for the duration of the COVID-19 declaration under Section 564(b)(1) of the Act, 21 U.S.C. section 360bbb-3(b)(1), unless the authorization is terminated or revoked.  Performed at [facility], [address] [place], [place] [address]   Culture, blood (routine x 2)     Status: None (Preliminary result)   Collection Time: 02/11/21  3:54 PM   Specimen: Left Antecubital; Blood  Result Value Ref Range   Specimen Description      LEFT ANTECUBITAL BOTTLES DRAWN AEROBIC AND ANAEROBIC   Special Requests Blood Culture adequate volume    Culture      NO GROWTH < 12 HOURS Performed at [facility], [address], [place], [place] [address]    Report Status PENDING   Sedimentation rate     Status: Abnormal   Collection Time: 02/12/21  3:49 AM  Result Value Ref Range   Sed Rate 45 (H) 0 - 22 mm/hr    Comment: Performed at [facility], [address] [place], [place] [address]  C-reactive protein     Status: Abnormal   Collection Time: 02/12/21  3:49 AM  Result Value Ref Range   CRP 4.3 (H) <1.0 mg/dL    Comment: Performed at [facility], [address] [place], [place] [address]  Prealbumin     Status: Abnormal   Collection Time: 02/12/21  3:49 AM  Result Value Ref Range   Prealbumin 12.8 (L) 18 - 38 mg/dL    Comment: Performed at [facility] [address] [place], [place] [address]  Comprehensive metabolic panel     Status: Abnormal   Collection Time: 02/12/21  3:49 AM  Result Value Ref Range   Sodium 135 135 - 145 mmol/L   Potassium 4.0 3.5 - 5.1 mmol/L   Chloride 108 98 - 111 mmol/L   CO2 20 (L) 22 - 32 mmol/L   Glucose, Bld 93 70 - 99 mg/dL    Comment: Glucose reference range applies only to samples taken after fasting for at least 8 hours.   BUN 15 8 - 23 mg/dL   Creatinine, Ser 0.96 0.44 - 1.00 mg/dL   Calcium 8.1 (L) 8.9 - 10.3 mg/dL   Total Protein 6.0 (L) 6.5 - 8.1 g/dL    Albumin 2.6 (L) 3.5 - 5.0 g/dL   AST 27 15 - 41 U/L   ALT 17 0 - 44 U/L   Alkaline Phosphatase 74 38 - 126 U/L   Total Bilirubin 0.6 0.3 - 1.2 mg/dL   GFR, Estimated >60 >60 mL/min    Comment: (NOTE) Calculated using the CKD-EPI Creatinine Equation (2021)    Anion gap 7 5 - 15    Comment: Performed at [facility], [address] [place], [place] [address]  Magnesium     Status: None   Collection Time: 02/12/21  3:49 AM  Result Value Ref Range   Magnesium 1.9 1.7 - 2.4 mg/dL    Comment: Performed at [facility], [address], [place], [place] [address]  CBC     Status:  Abnormal   Collection Time: 02/12/21  3:49 AM  Result Value Ref Range   WBC 10.4 4.0 - 10.5 K/uL   RBC 2.89 (L) 3.87 - 5.11 MIL/uL   Hemoglobin 9.1 (L) 12.0 - 15.0 g/dL   HCT 27.2 (L) 36.0 - 46.0 %   MCV 94.1 80.0 - 100.0 fL   MCH 31.5 26.0 - 34.0 pg   MCHC 33.5 30.0 - 36.0 g/dL   RDW 16.9 (H) 11.5 - 15.5 %   Platelets 347 150 - 400 K/uL   nRBC 0.0 0.0 - 0.2 %    Comment: Performed at [facility], [address], [place], [place] [address]  CBG monitoring, ED     Status: None   Collection Time: 02/12/21  8:10 AM  Result Value Ref Range   Glucose-Capillary 95 70 - 99 mg/dL    Comment: Glucose reference range applies only to samples taken after fasting for at least 8 hours.   Comment 1 Notify RN    Comment 2 Document in Chart     MR FOOT LEFT W WO CONTRAST  Result Date: 02/11/2021 CLINICAL DATA:  Foot swelling, diabetic, osteomyelitis suspected, xray done EXAM: MRI OF THE LEFT FOREFOOT WITHOUT AND WITH CONTRAST TECHNIQUE: Multiplanar, multisequence MR imaging of the left forefoot was performed both before and after administration of intravenous contrast. CONTRAST:  87mL GADAVIST GADOBUTROL 1 MMOL/ML IV SOLN COMPARISON:  Left foot radiograph 01/28/2021 FINDINGS: Bones/Joint/Cartilage There is a serpiginous lesion in the calcaneus posteriorly with preserved internal fat and a rim of T2 hyperintensity. There is  mild fifth MTP arthritic change. There is mild linear enhancement and low T1 signal along the plantar aspect of the calcaneus near the plantar fascia insertion, partially visualized in a single plane. There is a partially visualized possible soft tissue ulcer along the plantar heel obscured by wraparound artifact on axial T1 image 39. In the forefoot, there is no evidence of osteomyelitis. Ligaments Intact Lisfranc ligament. Muscles and Tendons There is mild muscle atrophy with intramuscular edema commonly seen in diabetes. There is no evidence of acute tendon tear. Soft tissues There is extensive soft tissue swelling. There is focal soft tissue inflammatory change with edema and enhancement along the soft tissues adjacent to the distal phalanx of the fifth toe. IMPRESSION: Extensive soft tissue swelling and focal soft tissue inflammatory change at the tip of the fifth toe. No evidence of osteomyelitis in the forefoot. Partially visualized hindfoot with likely bone infarct in the calcaneus. There is linear low T1 signal and enhancement along the plantar aspect of the calcaneus near the plantar fascia insertion and possible overlying soft tissue ulcer along the plantar heel, partially visualized only on axial images. Consider dedicated ankle MRI to further evaluate for hindfoot infection. Electronically Signed   By: Maurine Simmering   On: 02/11/2021 18:49   MR ANKLE LEFT W WO CONTRAST  Result Date: 02/12/2021 CLINICAL DATA:  Heel ulcer EXAM: MRI OF THE LEFT ANKLE WITHOUT AND WITH CONTRAST TECHNIQUE: Multiplanar, multisequence MR imaging of the ankle was performed before and after the administration of intravenous contrast. CONTRAST:  12mL GADAVIST GADOBUTROL 1 MMOL/ML IV SOLN COMPARISON:  X-ray 01/28/2021, forefoot MRI 02/11/2021 FINDINGS: TENDONS Peroneal: Intact peroneus longus and peroneus brevis tendons. Posteromedial: Intact tibialis posterior, flexor hallucis longus and flexor digitorum longus tendons. Anterior:  Intact tibialis anterior, extensor hallucis longus and extensor digitorum longus tendons. Achilles: Mild distal Achilles tendinosis without tear. Large ossified posterior calcaneal enthesophyte. Plantar Fascia: Minimal thickening of the central  band of the plantar fascia without tear. LIGAMENTS Lateral: The anterior and posterior tibiofibular ligaments are intact. The anterior and posterior talofibular ligaments are intact. Intact calcaneofibular ligament. Medial: Deltoid ligament and spring ligament complex intact. CARTILAGE Ankle Joint: No joint effusion or chondral defect. Subtalar Joints/Sinus Tarsi: No joint effusion or chondral defect. Preservation of the anatomic fat within the sinus tarsi. Bones: Prominent bone infarct within the posterior calcaneal body. No bony erosion. No marrow edema or periostitis. No acute fracture or malalignment. Talonavicular osteoarthritis with prominent dorsal spurring at the talus. No suspicious marrow replacing bone lesion. Other: Shallow soft tissue ulceration at the plantar aspect of the hindfoot (series 9, image 13). No associated sinus tract or fluid collection. Circumferential subcutaneous edema throughout the visualized lower leg, ankle, and foot. No evidence of abscess. IMPRESSION: IMPRESSION 1. Shallow soft tissue ulceration at the plantar aspect of the left hindfoot. No associated sinus tract, fluid collection, or osteomyelitis of the subjacent calcaneus. 2. Circumferential subcutaneous edema throughout the visualized lower leg, ankle, and foot. 3. Mild distal Achilles tendinosis without tear. 4. Incidentally noted calcaneal bone infarction. Electronically Signed   By: Davina Poke D.O.   On: 02/12/2021 10:27   US ARTERIAL ABI (SCREENING LOWER EXTREMITY)  Result Date: 02/12/2021 CLINICAL DATA:  Left foot pain and swelling for 1 month. Left foot osteomyelitis. Nonhealing wound for 1 month. EXAM: NONINVASIVE PHYSIOLOGIC VASCULAR STUDY OF BILATERAL LOWER  EXTREMITIES TECHNIQUE: Evaluation of both lower extremities were performed at rest, including calculation of ankle-brachial indices with single level Doppler, pressure and pulse volume recording. COMPARISON:  None. FINDINGS: Right ABI:  0.79 Left ABI:  0.75 Right Lower Extremity:  Normal arterial waveforms at the ankle. Left Lower Extremity: Monophasic waveform seen in the posterior tibial and dorsalis pedis arteries. 0.5-0.79 Moderate PAD IMPRESSION: ABI findings indicative of moderate bilateral lower extremity arterial occlusive disease. Electronically Signed   By: Miachel Roux M.D.   On: 02/12/2021 09:38     Assessment & Plan:  ARELIS NEUMEIER is a 70 y.o. female with moderate occlusive disease bilaterally and a worsening left heel wound that was probed by wound care and reported to have purulence. No osteo on MRI. Likely they ruptured whatever abscess there was as there is no further collection. The patient is tender and does not want her foot messed with she reports.   -Santyl for enzymatic debridement with saline dampened gauze BID, could use potential mechanical debridement in the future  -Antibiotic for edematous changes/ cellulitis  -Needs vascular to weigh in about moderate occlusive disease given need to have good blood flow for healing/ antibiotics   All questions were answered to the satisfaction of the patient. Updated Dr. Roderic Palau.    Virl Cagey 02/12/2021, 5:10 PM

## 2021-02-12 NOTE — Evaluation (Signed)
Physical Therapy Evaluation Patient Details Name: [name] MRN: [id number] DOB: 1951-07-11 Today's Date: 02/12/2021   History of Present Illness  [name]  is a 70 y.o. female,with history of anxiety/depression, GERD, HTN, HLD, OSA/obesity hypoventilation syndrome, and pre-diabetes presents to the ED with a chief complaint of increase in left foot pain and swelling.  Patient reports that she has been following with wound care in [facility] for 4 months for a lower extremity wound on the left side.  She reports she has been on Cipro for 2 weeks.  The last 1 month she has had an increase in her pain and swelling in her left foot.  She reports that she has taken some Tylenol and it has not helped.  She does report drainage from the wound but she is unsure what color the drainage is.  She reports there was bleeding in the wound today.  Today when she went to wound care the provider there probed the wound.  He advised her to come into the hospital as previously when he had probe he could not reach bone and there was no purulence.  Today when he probed the wound he reported to the ED staff that he went all the way to bone, and had purulent drainage on the probe afterwards.  Provider is also worried the patient is no longer able to take care of her self at home.  She does not check her sugars at home as she reports that she is prediabetic.  She reports that the only time she had to be on metformin/diabetic medications was when she had hyperglycemia induced by prednisone.  Patient denies any fevers but does report chills.  She has been nauseous but not had any episodes of vomiting.  She reports 2 loose stools yesterday, no melena or hematochezia.  She reports that she does not smoke, does not drink alcohol, does not use illicit drugs.  She has had 2 shots for COVID.  She is full code.   Clinical Impression  Patient demonstrates slow labored movement for sitting up at bedside requiring Min assist to  move legs, slightly labored slow cadence during ambulation with fair return for weightbearing on LLE with mild increase in pain, no loss of balance and limited mostly due to c/o fatigue.  Patient put back to bed after therapy.  Patient will benefit from continued physical therapy in hospital and recommended venue below to increase strength, balance, endurance for safe ADLs and gait.      Follow Up Recommendations Home health PT;Supervision for mobility/OOB;Supervision - Intermittent    Equipment Recommendations  None recommended by PT    Recommendations for Other Services       Precautions / Restrictions Precautions Precautions: Fall Restrictions Weight Bearing Restrictions: No      Mobility  Bed Mobility Overal bed mobility: Needs Assistance Bed Mobility: Supine to Sit;Sit to Supine     Supine to sit: Min guard Sit to supine: Min assist   General bed mobility comments: required Min assist to move BLE onto bed during sit to supine    Transfers Overall transfer level: Needs assistance Equipment used: Rolling walker (2 wheeled) Transfers: Sit to/from [facility] Sit to Stand: Min guard Stand pivot transfers: Min guard       General transfer comment: increased time, labored movement  Ambulation/Gait Ambulation/Gait assistance: Min guard;Supervision Gait Distance (Feet): 75 Feet Assistive device: Rolling walker (2 wheeled) Gait Pattern/deviations: Decreased step length - right;Decreased step length - left;Decreased stride length  Gait velocity: decreased   General Gait Details: slow slightly labored cadence without loss of balance with left foot pain at baseline, limited secondary to fatigue  Stairs            Wheelchair Mobility    Modified Rankin (Stroke Patients Only)       Balance Overall balance assessment: Needs assistance Sitting-balance support: Feet supported;No upper extremity supported Sitting balance-Leahy Scale: Good Sitting  balance - Comments: seated at EOB   Standing balance support: During functional activity;Bilateral upper extremity supported Standing balance-Leahy Scale: Fair Standing balance comment: using RW                             Pertinent Vitals/Pain Pain Assessment: 0-10 Pain Score: 8  Pain Location: left foot/ankle Pain Descriptors / Indicators: Sore Pain Intervention(s): Limited activity within patient's tolerance;Monitored during session;Repositioned    Home Living Family/patient expects to be discharged to:: Private residence Living Arrangements: Alone Available Help at Discharge: Family;Available 24 hours/day Type of Home: House Home Access: Stairs to enter     Home Layout: One level Home Equipment: Environmental consultant - 2 wheels;Cane - single point;Shower seat      Prior Function Level of Independence: Needs assistance   Gait / Transfers Assistance Needed: household ambulator using RW  ADL's / Homemaking Assistance Needed: assisted by family        Hand Dominance   Dominant Hand: Right    Extremity/Trunk Assessment   Upper Extremity Assessment Upper Extremity Assessment: Overall WFL for tasks assessed    Lower Extremity Assessment Lower Extremity Assessment: Generalized weakness    Cervical / Trunk Assessment Cervical / Trunk Assessment: Normal  Communication   Communication: No difficulties  Cognition Arousal/Alertness: Awake/alert Behavior During Therapy: WFL for tasks assessed/performed Overall Cognitive Status: Within Functional Limits for tasks assessed                                        General Comments      Exercises     Assessment/Plan    PT Assessment Patient needs continued PT services  PT Problem List Decreased strength;Decreased activity tolerance;Decreased balance;Decreased mobility       PT Treatment Interventions DME instruction;Gait training;Stair training;Functional mobility training;Therapeutic  activities;Therapeutic exercise;Balance training;Patient/family education    PT Goals (Current goals can be found in the Care Plan section)  Acute Rehab PT Goals Patient Stated Goal: return home with family to assist PT Goal Formulation: With patient Time For Goal Achievement: 02/16/21 Potential to Achieve Goals: Good    Frequency Min 3X/week   Barriers to discharge        Co-evaluation               AM-PAC PT "6 Clicks" Mobility  Outcome Measure Help needed turning from your back to your side while in a flat bed without using bedrails?: A Little Help needed moving from lying on your back to sitting on the side of a flat bed without using bedrails?: A Little Help needed moving to and from a bed to a chair (including a wheelchair)?: A Little Help needed standing up from a chair using your arms (e.g., wheelchair or bedside chair)?: A Little Help needed to walk in hospital room?: A Little Help needed climbing 3-5 steps with a railing? : A Lot 6 Click Score: 17    End of Session  Activity Tolerance: Patient tolerated treatment well;Patient limited by fatigue Patient left: in bed;with call bell/phone within reach Nurse Communication: Mobility status PT Visit Diagnosis: Unsteadiness on feet (R26.81);Other abnormalities of gait and mobility (R26.89);Muscle weakness (generalized) (M62.81)    Time: 3202-3343 PT Time Calculation (min) (ACUTE ONLY): 23 min   Charges:   PT Evaluation $PT Eval Low Complexity: 1 Low $PT Eval Moderate Complexity: 1 Mod PT Treatments $Therapeutic Activity: 23-37 mins        3:27 PM, 02/12/21 Lonell Grandchild, MPT Physical Therapist with [facility] [phone] office [phone] mobile phone

## 2021-02-12 NOTE — ED Notes (Signed)
Pt in bed, pt up to bedside potty with 2 person assist, pt reports increasing pain, requests pain med, requests food, md asked about food/npo/ surgery

## 2021-02-12 NOTE — ED Notes (Signed)
Pt gone to MRI 

## 2021-02-12 NOTE — Progress Notes (Signed)
Patient admitted to the hospital earlier this morning by Dr. Clearence Ped  Patient seen and examined.  Continues to complain of pain in her left heel.  She reports the wound on her heel has been present for several months, but has become more tender, swollen and having drainage for the past several weeks     Assessment/plan  Nonhealing wound of left heel and cellulitis -She was sent to the emergency room from the wound care center when it was noted that her wound was progressively getting worse and wound care MD was able to probe wound down to bone -There was also noted to have purulent drainage and foul smell from the wound -Wound cultures performed as an outpatient were positive for Serratia and she was treated with approximately 10 days of ciprofloxacin without improvement -She has been started on intravenous antibiotics for now with vancomycin and cefepime -Repeat culture has been ordered -MRI of the foot did not show any evidence of underlying osteomyelitis or abscess -Seen by general surgery and it was not felt that she would tolerate sharp debridement therefore she has been started on Santyl twice daily -ABIs found to be 0.75 on the left with monophasic waveforms -Discussed with vascular, Dr. Stanford Breed and it was felt that her ABIs may be falsely elevated -Since patient has continued pain and worsening of her wound over the last several weeks, it was felt that arteriogram during this admission may be helpful -She will be transferred to [facility] once bed is available  Diabetes -She is chronically on metformin -Last A1c from 08/2020 noted to be 8.7 -Repeat A1c pending -Continue on sliding scale insulin for now  Lymphedema -Continues have significant swelling, more so in the left lower extremity -We will resume home dose of Lasix  Obstructive sleep apnea -Continue on CPAP  Ocular myasthenia gravis -Continue on pyridostigmine  GERD -Continue  PPI  Hyperlipidemia -Continue statin  Generalized weakness -Physical therapy evaluation with recommendations for home health PT -May need to reevaluate further and her hospital course since it does not appear that she has adequate supervision at home  Patient's daughter updated over the phone and agrees with plan  Kathie Dike

## 2021-02-12 NOTE — ED Notes (Signed)
Pt in bed with eyes closed, resps even and unlabored, pt emitting a snoring like noise, pt arouses easily to verbal stim.  Pt states that her pain is a 5/10

## 2021-02-12 NOTE — TOC Initial Note (Signed)
Transition of Care [facility]) - Initial/Assessment Note    Patient Details  Name: [name] MRN: [id number] Date of Birth: 01-Apr-1951  Transition of Care [facility]) CM/SW Contact:    Iona Beard, [place] Phone Number: 02/12/2021, 9:17 AM  Clinical Narrative:                 Pt is high risk for readmission. CSW spoke with pt in ED to complete assessment. Pt states that she lives alone. Pt states that she sometimes struggles to complete her ADLs independently. Pt states that her transportation is provided by [employer] and her granddaughter. Pt states that she has not had [place] services. Pt has been going to get wound care at [facility]. Pt states she has a cane, walker, shower seat, safety alert. Her O2 is supplied by [employer]. She wear 1L O2 at night. Pt states that [facility] Medicare has ordered her a hospital bed and wheelchair but she is not sure when it will arrive. TOC to follow.   Expected Discharge Plan: Home/Self Care Barriers to Discharge: Continued Medical Work up   Patient Goals and CMS Choice Patient states their goals for this hospitalization and ongoing recovery are:: Return home CMS Medicare.gov Compare Post Acute Care list provided to:: Patient Choice offered to / list presented to : Patient  Expected Discharge Plan and Services Expected Discharge Plan: Home/Self Care In-house Referral: Clinical Social Work Discharge Planning Services: CM Consult   Living arrangements for the past 2 months: Single Family Home                                      Prior Living Arrangements/Services Living arrangements for the past 2 months: Single Family Home Lives with:: Self Patient language and need for interpreter reviewed:: Yes Do you feel safe going back to the place where you live?: Yes      Need for Family Participation in Patient Care: No (Comment) Care giver support system in place?: Yes (comment) Current home services: DME, Safety alert (cane, walker, life alert,  shower seat, O2) Criminal Activity/Legal Involvement Pertinent to Current Situation/Hospitalization: No - Comment as needed  Activities of Daily Living      Permission Sought/Granted                  Emotional Assessment Appearance:: Appears stated age Attitude/Demeanor/Rapport: Engaged Affect (typically observed): Accepting Orientation: : Oriented to Self, Oriented to Place, Oriented to  Time, Oriented to Situation Alcohol / Substance Use: Not Applicable Psych Involvement: No (comment)  Admission diagnosis:  Osteomyelitis ([place]) [M86.9] Patient Active Problem List   Diagnosis Date Noted   Hypomagnesemia 02/12/2021   Social problem 02/12/2021   Osteomyelitis ([place]) 02/11/2021   Abscess of right lower extremity excluding foot 10/18/2020   Abscess of right leg 10/18/2020   Dehydration 10/03/2020   Leukocytosis 10/03/2020   Thrombocytosis 10/03/2020   Hyponatremia 10/03/2020   Hypochloremia 10/03/2020   Failure to thrive in adult 10/03/2020   Acute renal failure superimposed on stage 3b chronic kidney disease ([place]) 10/03/2020   Uncontrolled type 2 diabetes mellitus with hyperglycemia, without long-term current use of insulin ([place]) 10/03/2020   Acute kidney injury superimposed on CKD ([place]) 10/02/2020   Chronic kidney disease 10/01/2020   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/2020   Chronic insomnia 01/08/2020   Ocular myasthenia gravis ([place]) 09/18/2019   Peripheral edema 01/23/2019   Asthma 05/14/2015  Heel spur 02/24/2015   Depression 02/24/2015   Post-menopausal bleeding 06/02/2014   OA (osteoarthritis) of knee 04/09/2014   Epidermal cyst 10/01/2013   Class 3 obesity 02/25/2013   Back pain 03/04/2012   Gout 01/25/2012   Edema 01/28/2011   Obesity hypoventilation syndrome ([place]) 06/16/2008   OBSTRUCTIVE SLEEP APNEA 05/08/2008   HLD (hyperlipidemia) 08/28/2006   Anxiety 08/28/2006   Essential hypertension 08/28/2006   GERD 08/28/2006   Osteoarthritis  08/28/2006   PCP:  Leslie Andrea, MD Pharmacy:   [facility], [address] AT [address] [place] [address] Phone: [phone] Fax: [phone]     Social Determinants of Health (SDOH) Interventions    Readmission Risk Interventions Readmission Risk Prevention Plan 02/12/2021 10/20/2020 10/04/2020  Transportation Screening Complete Complete Complete  PCP or Specialist Appt within 5-7 Days - - Complete  Home Care Screening - - Complete  Medication Review (RN CM) - - Complete  HRI or Home Care Consult - Complete -  Social Work Consult for Recovery Care Planning/Counseling - Complete -  Palliative Care Screening - Not Applicable -  Medication Review Press photographer) Complete Complete -  [place] or Home Care Consult Complete - -  SW Recovery Care/Counseling Consult Complete - -  Palliative Care Screening Not Applicable - -  [place] Not Applicable - -  Some recent data might be hidden

## 2021-02-13 DIAGNOSIS — E782 Mixed hyperlipidemia: Secondary | ICD-10-CM | POA: Diagnosis not present

## 2021-02-13 DIAGNOSIS — G4733 Obstructive sleep apnea (adult) (pediatric): Secondary | ICD-10-CM | POA: Diagnosis not present

## 2021-02-13 DIAGNOSIS — E119 Type 2 diabetes mellitus without complications: Secondary | ICD-10-CM

## 2021-02-13 DIAGNOSIS — Z87891 Personal history of nicotine dependence: Secondary | ICD-10-CM

## 2021-02-13 DIAGNOSIS — E1159 Type 2 diabetes mellitus with other circulatory complications: Secondary | ICD-10-CM | POA: Diagnosis not present

## 2021-02-13 DIAGNOSIS — I70244 Atherosclerosis of native arteries of left leg with ulceration of heel and midfoot: Secondary | ICD-10-CM

## 2021-02-13 LAB — GLUCOSE, CAPILLARY
Glucose-Capillary: 132 mg/dL — ABNORMAL HIGH (ref 70–99)
Glucose-Capillary: 113 mg/dL — ABNORMAL HIGH (ref 70–99)
Glucose-Capillary: 84 mg/dL (ref 70–99)
Glucose-Capillary: 106 mg/dL — ABNORMAL HIGH (ref 70–99)

## 2021-02-13 MED ORDER — LIDOCAINE VISCOUS HCL 2 % MT SOLN
15.0000 mL | Freq: Once | OROMUCOSAL | Status: AC
  Administered 2021-02-13: 15 mL via ORAL
  Filled 2021-02-13: qty 15

## 2021-02-13 MED ORDER — ALUM & MAG HYDROXIDE-SIMETH 200-200-20 MG/5ML PO SUSP
30.0000 mL | Freq: Once | ORAL | Status: AC
  Administered 2021-02-13: 30 mL via ORAL
  Filled 2021-02-13: qty 30

## 2021-02-13 MED ORDER — INSULIN ASPART 100 UNIT/ML IJ SOLN: 0.0000 [IU] | Freq: Three times a day (TID) | INTRAMUSCULAR | Status: DC

## 2021-02-13 MED ORDER — INSULIN ASPART 100 UNIT/ML IJ SOLN: 0.0000 [IU] | Freq: Every day | INTRAMUSCULAR | Status: DC

## 2021-02-13 NOTE — Consult Note (Addendum)
[facility]  ASSESSMENT / PLAN: [name] is a 70 y.o. female with atherosclerosis of native arteries of left lower extremity causing heel ulceration.  Patient counseled patients with chronic limb threatening ischemia have an annual risk of cardiovascular mortality of 25% and a annual amputation risk of 25%. Aggressive risk factor modification and intervention for limb salvage is indicated..  Recommend the following which can slow the progression of atherosclerosis and reduce the risk of major adverse cardiac / limb events:  Complete cessation from all tobacco products. Blood glucose control with goal A1c < 7%. I counseled her that with an A1c of 8.7 she is no longer "pre-diabetic," and now has a diagnosis of DM2. I will ask the DM counselor to see her.  Blood pressure control with goal blood pressure < 140/90 mmHg. Lipid reduction therapy with goal LDL-C <100 mg/dL (<70 if symptomatic from PAD).  Aspirin 81mg  PO QD.  Atorvastatin 40-80mg  PO QD (or other "high intensity" statin therapy).  Plan left lower extremity angiogram via right common femoral artery approach 02/15/2021 with Dr. Donzetta [name].  CHIEF COMPLAINT: left heel ulceration  HISTORY OF PRESENT ILLNESS: [name] is a 70 y.o. female with left heel ulcer which has been present for more than four months. Over the past month, the pain has grown worse. She was started on Cipro 2 weeks ago.  She is limited in her ability to ambulate.  I suspect she does not walk far or fast enough to claudicate.  She has constant pain in the ulcer but not the pain typical of ischemic rest pain.  She has seen our group in the past for venous ulceration about her ankles.  This resolved with simple compression therapy.  VASCULAR SURGICAL HISTORY: Denies  VASCULAR RISK FACTORS: Negative history of cerebrovascular disease / stroke / transient ischemic attack. Negative history of coronary artery disease.  Positive  history of diabetes mellitus. Last A1c 8.7. Positive history of smoking. Not actively smoking - quit 2006. Positive history of hypertension.  Negative history of chronic kidney disease. Last GFR >60.  Negative history of chronic obstructive pulmonary disease.  AMBULATORY STATUS: Minimally ambulatory about the home. Not able to ambulate outside the home.   Past Medical History:  Diagnosis Date   Achilles tendinitis    Anxiety    Asthma    Depression    GERD (gastroesophageal reflux disease)    Gout    H/O myasthenia gravis    left eye   HTN (hypertension)    Hyperlipidemia    Low back pain    Obesity hypoventilation syndrome ([place])    Obstructive sleep apnea    Osteoarthritis     Past Surgical History:  Procedure Laterality Date   CATARACT EXTRACTION W/PHACO Left 09/17/2015   Procedure: CATARACT EXTRACTION PHACO AND INTRAOCULAR LENS PLACEMENT LEFT EYE cde=8.58;  Surgeon: [name];  Location: [facility];  Service: Ophthalmology;  Laterality: Left;   CATARACT EXTRACTION W/PHACO Right 05/15/2017   Procedure: CATARACT EXTRACTION PHACO AND INTRAOCULAR LENS PLACEMENT (IOC);  Surgeon: [name];  Location: [facility];  Service: Ophthalmology;  Laterality: Right;  CDE: 6.34   COLONOSCOPY N/A 12/25/2013   Procedure: COLONOSCOPY;  Surgeon: [name];  Location: [facility];  Service: Endoscopy;  Laterality: N/A;  730-rescheduled Ann notified pt   COLONOSCOPY WITH PROPOFOL N/A 10/04/2019   Procedure: COLONOSCOPY WITH PROPOFOL;  Surgeon: [name];  Location: [facility];  Service: Endoscopy;  Laterality: N/A;  815   CYST EXCISION N/A 09/12/2016   Procedure: EXCISION SEBACEOUS CYST, BACK;  Surgeon: [name];  Location: [facility];  Service: General;  Laterality: N/A;   INTRAOCULAR LENS INSERTION Bilateral 2018   Dr. Tonny [name]    POLYPECTOMY  10/04/2019   Procedure: POLYPECTOMY;  Surgeon: [name];  Location: [facility];  Service: Endoscopy;;    TUBAL LIGATION      Family History  Problem Relation Age of Onset   Heart disease Mother    Thyroid disease Mother    Heart failure Father    Lung disease Father    Diabetes Brother    Crohn's disease Daughter     Social History   Socioeconomic History   Marital status: Widowed    Spouse name: Not on file   Number of children: 3   Years of education: 14   Highest education level: Not on file  Occupational History   Occupation: disabled    Employer: UNEMPLOYED  Tobacco Use   Smoking status: Former    Packs/day: 2.00    Years: 36.00    Pack years: 72.00    Types: Cigarettes    Start date: 06/25/1969    Quit date: 03/25/2005    Years since quitting: 15.9   Smokeless tobacco: Never  Vaping Use   Vaping Use: Never used  Substance and Sexual Activity   Alcohol use: No    Alcohol/week: 0.0 standard drinks   Drug use: No   Sexual activity: Not Currently    Birth control/protection: Post-menopausal  Other Topics Concern   Not on file  Social History Narrative   Originally from [place]. She has always lived in [place]. 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 No pets currently. No bird, mold, or hot tub exposure.    Lives alone   No caffeine   Social Determinants of Health   Financial Resource Strain: Not on file  Food Insecurity: Not on file  Transportation Needs: Not on file  Physical Activity: Not on file  Stress: Not on file  Social Connections: Not on file  Intimate Partner Violence: Not on file    No Known Allergies  Current Facility-Administered Medications  Medication Dose Route Frequency Provider Last Rate Last Admin   acetaminophen (TYLENOL) tablet 650 mg  650 mg Oral Q6H PRN [name], [name] B, DO       Or   acetaminophen (TYLENOL) suppository 650 mg  650 mg Rectal Q6H PRN [name], [name] B, DO       albuterol (PROVENTIL) (2.5 MG/3ML) 0.083% nebulizer solution 2.5 mg  2.5 mg Nebulization Q4H PRN [name], [name] B, DO        aspirin EC tablet 81 mg  81 mg Oral QPM [name], [name] B, DO   81 mg at 02/12/21 1830   atorvastatin (LIPITOR) tablet 40 mg  40 mg Oral Daily [name], [name] B, DO   40 mg at 02/13/21 0915   busPIRone (BUSPAR) tablet 5 mg  5 mg Oral BID [name], [name] B, DO   5 mg at 02/13/21 0915   ceFEPIme (MAXIPIME) 2 g in sodium chloride 0.9 % 100 mL IVPB  2 g Intravenous Q8H [name], [name] B, DO 200 mL/hr at 02/13/21 0919 2 g at 02/13/21 0919   collagenase (SANTYL) ointment   Topical BID Virl Cagey, [name]       feeding supplement (ENSURE ENLIVE / ENSURE PLUS) liquid 237 mL  237  mL Oral BID BM Kathie Dike, [name]       furosemide (LASIX) tablet 40 mg  40 mg Oral Daily [name], [name] B, DO   40 mg at 02/13/21 0915   heparin injection 5,000 Units  5,000 Units Subcutaneous Q8H [name], [name] B, DO   5,000 Units at 02/13/21 0534   HYDROcodone-acetaminophen (NORCO) 7.5-325 MG per tablet 1 tablet  1 tablet Oral BID PRN [name], [name] B, DO   1 tablet at 02/13/21 0534   morphine 2 MG/ML injection 2 mg  2 mg Intravenous Q2H PRN [name], [name] B, DO   2 mg at 02/12/21 2329   ondansetron (ZOFRAN) tablet 4 mg  4 mg Oral Q6H PRN [name], [name] B, DO       Or   ondansetron (ZOFRAN) injection 4 mg  4 mg Intravenous Q6H PRN [name], [name] B, DO       pantoprazole (PROTONIX) EC tablet 40 mg  40 mg Oral Daily [name], [name] B, DO   40 mg at 02/13/21 0915   polyethylene glycol (MIRALAX / GLYCOLAX) packet 17 g  17 g Oral Daily PRN [name], [name] B, DO       pyridostigmine (MESTINON) tablet 60 mg  60 mg Oral TID [name], [name] B, DO   60 mg at 02/13/21 0915   traZODone (DESYREL) tablet 100 mg  100 mg Oral QHS PRN [name], [name] B, DO       vancomycin (VANCOREADY) IVPB 1500 mg/300 mL  1,500 mg Intravenous Q24H [name], [name] B, DO   Stopped at 02/12/21 2037    REVIEW OF SYSTEMS:  [X]  denotes positive finding, [ ]  denotes negative finding Cardiac  Comments:   Chest pain or chest pressure:    Shortness of breath upon exertion:    Short of breath when lying flat:    Irregular heart rhythm:        Vascular    Pain in calf, thigh, or hip brought on by ambulation:    Pain in feet at night that wakes you up from your sleep:     Blood clot in your veins:    Leg swelling:         Pulmonary    Oxygen at home:    Productive cough:     Wheezing:         Neurologic    Sudden weakness in arms or legs:     Sudden numbness in arms or legs:     Sudden onset of difficulty speaking or slurred speech:    Temporary loss of vision in one eye:     Problems with dizziness:         Gastrointestinal    Blood in stool:     Vomited blood:         Genitourinary    Burning when urinating:     Blood in urine:        Psychiatric    Major depression:         Hematologic    Bleeding problems:    Problems with blood clotting too easily:        Skin    Rashes or ulcers:        Constitutional    Fever or chills:      PHYSICAL EXAM Vitals:   02/12/21 1811 02/12/21 2151 02/12/21 2305 02/13/21 0323  BP: 106/82 (!) 144/52 (!) 123/47 (!) 117/54  Pulse: 76 80 78 78  Resp: 20 15 20 16   Temp: 97.9 F (36.6  C) 98.7 F (37.1 C) 97.7 F (36.5 C) 97.6 F (36.4 C)  TempSrc:   Oral Oral  SpO2: 100% 100% 100% 100%  Weight:      Height:        Constitutional: Chronically ill appearing. No distress. Obese. Poor dentition.  Neurologic: CN intact. no focal findings. no sensory loss. Psychiatric:  Mood and affect symmetric and appropriate. Eyes:  No icterus. No conjunctival pallor. Ears, nose, throat:  mucous membranes moist. Midline trachea.  Cardiac: regular rate and rhythm.  Respiratory:  unlabored. Abdominal:  soft, non-tender, non-distended.  Peripheral vascular: 2+ femoral pulses bilaterally  Absent popliteal pulses bilaterally  Absent pedal pulses bilaterally  + DS in BLE peroneal > AT. PT absent bilaterally. Extremity: 1+ edema from mid calf  to feet. No cyanosis. No pallor.  Skin: No gangrene. + ulceration - see below.  Venous stasis dermatitis about the ankles bilaterally      Lymphatic: no Stemmer's sign. no palpable lymphadenopathy.  PERTINENT LABORATORY AND RADIOLOGIC DATA  Most recent CBC CBC Latest Ref Rng & Units 02/12/2021 02/11/2021 11/21/2020  WBC 4.0 - 10.5 K/uL 10.4 10.9(H) 7.6  Hemoglobin 12.0 - 15.0 g/dL 9.1(L) 10.0(L) 10.1(L)  Hematocrit 36.0 - 46.0 % 27.2(L) 29.6(L) 31.6(L)  Platelets 150 - 400 K/uL 347 378 354     Most recent CMP CMP Latest Ref Rng & Units 02/12/2021 02/11/2021 01/14/2021  Glucose 70 - 99 mg/dL 93 98 -  BUN 8 - 23 mg/dL 15 16 -  Creatinine 0.44 - 1.00 mg/dL 0.96 1.02(H) 1.20(H)  Sodium 135 - 145 mmol/L 135 134(L) -  Potassium 3.5 - 5.1 mmol/L 4.0 4.3 -  Chloride 98 - 111 mmol/L 108 104 -  CO2 22 - 32 mmol/L 20(L) 22 -  Calcium 8.9 - 10.3 mg/dL 8.1(L) 8.2(L) -  Total Protein 6.5 - 8.1 g/dL 6.0(L) - -  Total Bilirubin 0.3 - 1.2 mg/dL 0.6 - -  Alkaline Phos 38 - 126 U/L 74 - -  AST 15 - 41 U/L 27 - -  ALT 0 - 44 U/L 17 - -    Renal function Estimated Creatinine Clearance: 65.8 mL/min (by C-G formula based on SCr of 0.96 mg/dL).  Hgb A1c MFr Bld (% of total Hgb)  Date Value  09/16/2020 8.7 (H)    LDL Cholesterol (Calc)  Date Value Ref Range Status  01/08/2020 61 mg/dL (calc) Final    Comment:    Reference range: <100 . Desirable range <100 mg/dL for primary prevention;   <70 mg/dL for patients with CHD or diabetic patients  with > or = 2 CHD risk factors. Marland Kitchen LDL-C is now calculated using the Martin-Hopkins  calculation, which is a validated novel method providing  better accuracy than the Friedewald equation in the  estimation of LDL-C.  Cresenciano Genre et al. Annamaria Helling. 1740;814(48): 2061-2068  (http://education.[facility].com/faq/FAQ164)    Direct LDL  Date Value Ref Range Status  02/25/2013 157 (H) mg/dL Final    Comment:    ATP III Classification (LDL):       < 100         mg/dL         Optimal      100 - 129     mg/dL         Near or Above Optimal      130 - 159     mg/dL         Borderline High      160 - 189  mg/dL         High       > 190        mg/dL         Very High       Vascular Imaging:  ABI 0.79 (R) / 0.75 (L).  Waveforms suggest more severe disease on L ABI not reliable in DM.  Yevonne Aline. Stanford Breed, [name] Phone Number: [phone] 02/13/2021 10:10 AM

## 2021-02-13 NOTE — Progress Notes (Addendum)
PROGRESS NOTE    [name]  TFT:[id number] DOB: [birth year] DOA: 02/11/2021 PCP: Leslie Andrea, MD    Brief Narrative:  70 y/o female sent to ED from wound care center for worsening of L foot wound. There was concern for underlying osteomyelitis since per outpatient provider, wound could be probed to the bone. MRI foot did not show any osteomyelitis or abscess. ABI were 0.75, but likely falsely elevated. Since here wound was not healing despite wound care and antibiotics, she as seen by vascular with plans for arteriogram.   Assessment & Plan:   Active Problems:   HLD (hyperlipidemia)   OBSTRUCTIVE SLEEP APNEA   Ocular myasthenia gravis (HCC)   Osteomyelitis (HCC)   Hypomagnesemia   Social problem   Open wound of plantar aspect of foot   DM2 (diabetes mellitus, type 2) (HCC)   Nonhealing wound of left heel and cellulitis -She was sent to the emergency room from the wound care center when it was noted that her wound was progressively getting worse and wound care MD was able to probe wound down to bone -There was also noted to have purulent drainage and foul smell from the wound -Wound cultures performed as an outpatient were positive for Serratia and she was treated with approximately 10 days of ciprofloxacin without improvement -She has been started on intravenous antibiotics for now with vancomycin and cefepime -Repeat culture has been ordered -MRI of the foot did not show any evidence of underlying osteomyelitis or abscess -Seen by general surgery and it was not felt that she would tolerate sharp debridement therefore she has been started on Santyl twice daily, will also request WOC input -ABIs found to be 0.75 on the left with monophasic waveforms -vascular surgery following with plans for arteriogram on Monday  Diabetes -She is chronically on metformin -Last A1c from 08/2020 noted to be 8.7 -Repeat A1c pending -Continue on sliding scale insulin for now    Lymphedema -Continues have significant swelling, more so in the left lower extremity -continue home dose of Lasix   Obstructive sleep apnea -Continue on CPAP   Ocular myasthenia gravis -Continue on pyridostigmine   GERD -Continue PPI  Nausea/epigastric discomfort -EKG negative -improved after zofran -suspect GI etiology -she is already on PPI -will give a GI cocktail   Hyperlipidemia -Continue statin   Generalized weakness -Physical therapy evaluation with recommendations for home health PT -May need to reevaluate further and her hospital course since it does not appear that she has adequate supervision at home   DVT prophylaxis: heparin injection 5,000 Units Start: 02/11/21 2300 SCDs Start: 02/11/21 2300  Code Status: full code Family Communication: updated daughter on phone on 7/22 Disposition Plan: Status is: Inpatient  Remains inpatient appropriate because:Ongoing diagnostic testing needed not appropriate for outpatient work up, IV treatments appropriate due to intensity of illness or inability to take PO, and Inpatient level of care appropriate due to severity of illness  Dispo: The patient is from: Home              Anticipated d/c is to: Home              Patient currently is not medically stable to d/c.   Difficult to place patient No         Consultants:  Vascular surgery  Procedures:    Antimicrobials:  Vancomycin 7/22> Cefepime 7/22>    Subjective: Complains of some nausea and epigastric discomfort today. This has improved with antiemetics. No vomiting. Continues to  have pain in foot. Last BM yesterday  Objective: Vitals:   02/12/21 2305 02/13/21 0323 02/13/21 0755 02/13/21 1150  BP: (!) 123/47 (!) 117/54 (!) 97/51 (!) 115/43  Pulse: 78 78 85 80  Resp: 20 16 20 17   Temp: 97.7 F (36.5 C) 97.6 F (36.4 C) 97.6 F (36.4 C) (!) 97.4 F (36.3 C)  TempSrc: Oral Oral Oral Oral  SpO2: 100% 100% 99% 99%  Weight:      Height:         Intake/Output Summary (Last 24 hours) at 02/13/2021 1451 Last data filed at 02/13/2021 0246 Gross per 24 hour  Intake 742.71 ml  Output --  Net 742.71 ml   Filed Weights   02/11/21 1909  Weight: 108.4 kg    Examination:  General exam: Appears calm and comfortable  Respiratory system: Clear to auscultation. Respiratory effort normal. Cardiovascular system: S1 & S2 heard, RRR. No JVD, murmurs, rubs, gallops or clicks. No pedal edema. Gastrointestinal system: Abdomen is nondistended, soft and nontender. No organomegaly or masses felt. Normal bowel sounds heard. Central nervous system: Alert and oriented. No focal neurological deficits. Extremities: ulcer over left heel, bilateral lower extremity edema Psychiatry: Judgement and insight appear normal. Mood & affect appropriate.     Data Reviewed: I have personally reviewed following labs and imaging studies  CBC: Recent Labs  Lab 02/11/21 1432 02/12/21 0349  WBC 10.9* 10.4  NEUTROABS 7.7  --   HGB 10.0* 9.1*  HCT 29.6* 27.2*  MCV 93.1 94.1  PLT 378 202   Basic Metabolic Panel: Recent Labs  Lab 02/11/21 1432 02/12/21 0349  NA 134* 135  K 4.3 4.0  CL 104 108  CO2 22 20*  GLUCOSE 98 93  BUN 16 15  CREATININE 1.02* 0.96  CALCIUM 8.2* 8.1*  MG 1.3* 1.9   GFR: Estimated Creatinine Clearance: 65.8 mL/min (by C-G formula based on SCr of 0.96 mg/dL). Liver Function Tests: Recent Labs  Lab 02/12/21 0349  AST 27  ALT 17  ALKPHOS 74  BILITOT 0.6  PROT 6.0*  ALBUMIN 2.6*   No results for input(s): LIPASE, AMYLASE in the last 168 hours. No results for input(s): AMMONIA in the last 168 hours. Coagulation Profile: No results for input(s): INR, PROTIME in the last 168 hours. Cardiac Enzymes: No results for input(s): CKTOTAL, CKMB, CKMBINDEX, TROPONINI in the last 168 hours. BNP (last 3 results) No results for input(s): PROBNP in the last 8760 hours. HbA1C: No results for input(s): HGBA1C in the last 72  hours. CBG: Recent Labs  Lab 02/12/21 0810 02/13/21 0531 02/13/21 1152  GLUCAP 95 132* 113*   Lipid Profile: No results for input(s): CHOL, HDL, LDLCALC, TRIG, CHOLHDL, LDLDIRECT in the last 72 hours. Thyroid Function Tests: No results for input(s): TSH, T4TOTAL, FREET4, T3FREE, THYROIDAB in the last 72 hours. Anemia Panel: No results for input(s): VITAMINB12, FOLATE, FERRITIN, TIBC, IRON, RETICCTPCT in the last 72 hours. Sepsis Labs: No results for input(s): PROCALCITON, LATICACIDVEN in the last 168 hours.  Recent Results (from the past 240 hour(s))  Culture, blood (routine x 2)     Status: None (Preliminary result)   Collection Time: 02/11/21  2:37 PM   Specimen: Right Antecubital; Blood  Result Value Ref Range Status   Specimen Description   Final    RIGHT ANTECUBITAL BOTTLES DRAWN AEROBIC AND ANAEROBIC   Special Requests Blood Culture adequate volume  Final   Culture   Final    NO GROWTH 2 DAYS Performed at  [facility], [address], [place], [place] [address]    Report Status PENDING  Incomplete  Resp Panel by RT-PCR (Flu A&B, Covid) Nasopharyngeal Swab     Status: None   Collection Time: 02/11/21  3:31 PM   Specimen: Nasopharyngeal Swab; Nasopharyngeal(NP) swabs in vial transport medium  Result Value Ref Range Status   SARS Coronavirus 2 by RT PCR NEGATIVE NEGATIVE Final    Comment: (NOTE) SARS-CoV-2 target nucleic acids are NOT DETECTED.  The SARS-CoV-2 RNA is generally detectable in upper respiratory specimens during the acute phase of infection. The lowest concentration of SARS-CoV-2 viral copies this assay can detect is 138 copies/mL. A negative result does not preclude SARS-Cov-2 infection and should not be used as the sole basis for treatment or other patient management decisions. A negative result may occur with  improper specimen collection/handling, submission of specimen other than nasopharyngeal swab, presence of viral mutation(s) within the areas  targeted by this assay, and inadequate number of viral copies(<138 copies/mL). A negative result must be combined with clinical observations, patient history, and epidemiological information. The expected result is Negative.  Fact Sheet for Patients:  EntrepreneurPulse.com.au  Fact Sheet for Healthcare Providers:  IncredibleEmployment.be  This test is no t yet approved or cleared by the [place] FDA and  has been authorized for detection and/or diagnosis of SARS-CoV-2 by FDA under an Emergency Use Authorization (EUA). This EUA will remain  in effect (meaning this test can be used) for the duration of the COVID-19 declaration under Section 564(b)(1) of the Act, 21 U.S.C.section 360bbb-3(b)(1), unless the authorization is terminated  or revoked sooner.       Influenza A by PCR NEGATIVE NEGATIVE Final   Influenza B by PCR NEGATIVE NEGATIVE Final    Comment: (NOTE) The Xpert Xpress SARS-CoV-2/FLU/RSV plus assay is intended as an aid in the diagnosis of influenza from Nasopharyngeal swab specimens and should not be used as a sole basis for treatment. Nasal washings and aspirates are unacceptable for Xpert Xpress SARS-CoV-2/FLU/RSV testing.  Fact Sheet for Patients: EntrepreneurPulse.com.au  Fact Sheet for Healthcare Providers: IncredibleEmployment.be  This test is not yet approved or cleared by the [place] FDA and has been authorized for detection and/or diagnosis of SARS-CoV-2 by FDA under an Emergency Use Authorization (EUA). This EUA will remain in effect (meaning this test can be used) for the duration of the COVID-19 declaration under Section 564(b)(1) of the Act, 21 U.S.C. section 360bbb-3(b)(1), unless the authorization is terminated or revoked.  Performed at [facility], [address], [place], [place] [address]   Culture, blood (routine x 2)     Status: None (Preliminary result)    Collection Time: 02/11/21  3:54 PM   Specimen: Left Antecubital; Blood  Result Value Ref Range Status   Specimen Description   Final    LEFT ANTECUBITAL BOTTLES DRAWN AEROBIC AND ANAEROBIC   Special Requests Blood Culture adequate volume  Final   Culture   Final    NO GROWTH 2 DAYS Performed at [facility], [address] [place], [place] [address]    Report Status PENDING  Incomplete         Radiology Studies: MR FOOT LEFT W WO CONTRAST  Result Date: 02/11/2021 CLINICAL DATA:  Foot swelling, diabetic, osteomyelitis suspected, xray done EXAM: MRI OF THE LEFT FOREFOOT WITHOUT AND WITH CONTRAST TECHNIQUE: Multiplanar, multisequence MR imaging of the left forefoot was performed both before and after administration of intravenous contrast. CONTRAST:  52mL GADAVIST GADOBUTROL 1  MMOL/ML IV SOLN COMPARISON:  Left foot radiograph 01/28/2021 FINDINGS: Bones/Joint/Cartilage There is a serpiginous lesion in the calcaneus posteriorly with preserved internal fat and a rim of T2 hyperintensity. There is mild fifth MTP arthritic change. There is mild linear enhancement and low T1 signal along the plantar aspect of the calcaneus near the plantar fascia insertion, partially visualized in a single plane. There is a partially visualized possible soft tissue ulcer along the plantar heel obscured by wraparound artifact on axial T1 image 39. In the forefoot, there is no evidence of osteomyelitis. Ligaments Intact Lisfranc ligament. Muscles and Tendons There is mild muscle atrophy with intramuscular edema commonly seen in diabetes. There is no evidence of acute tendon tear. Soft tissues There is extensive soft tissue swelling. There is focal soft tissue inflammatory change with edema and enhancement along the soft tissues adjacent to the distal phalanx of the fifth toe. IMPRESSION: Extensive soft tissue swelling and focal soft tissue inflammatory change at the tip of the fifth toe. No evidence of osteomyelitis in the  forefoot. Partially visualized hindfoot with likely bone infarct in the calcaneus. There is linear low T1 signal and enhancement along the plantar aspect of the calcaneus near the plantar fascia insertion and possible overlying soft tissue ulcer along the plantar heel, partially visualized only on axial images. Consider dedicated ankle MRI to further evaluate for hindfoot infection. Electronically Signed   By: Maurine Simmering   On: 02/11/2021 18:49   MR ANKLE LEFT W WO CONTRAST  Result Date: 02/12/2021 CLINICAL DATA:  Heel ulcer EXAM: MRI OF THE LEFT ANKLE WITHOUT AND WITH CONTRAST TECHNIQUE: Multiplanar, multisequence MR imaging of the ankle was performed before and after the administration of intravenous contrast. CONTRAST:  61mL GADAVIST GADOBUTROL 1 MMOL/ML IV SOLN COMPARISON:  X-ray 01/28/2021, forefoot MRI 02/11/2021 FINDINGS: TENDONS Peroneal: Intact peroneus longus and peroneus brevis tendons. Posteromedial: Intact tibialis posterior, flexor hallucis longus and flexor digitorum longus tendons. Anterior: Intact tibialis anterior, extensor hallucis longus and extensor digitorum longus tendons. Achilles: Mild distal Achilles tendinosis without tear. Large ossified posterior calcaneal enthesophyte. Plantar Fascia: Minimal thickening of the central band of the plantar fascia without tear. LIGAMENTS Lateral: The anterior and posterior tibiofibular ligaments are intact. The anterior and posterior talofibular ligaments are intact. Intact calcaneofibular ligament. Medial: Deltoid ligament and spring ligament complex intact. CARTILAGE Ankle Joint: No joint effusion or chondral defect. Subtalar Joints/Sinus Tarsi: No joint effusion or chondral defect. Preservation of the anatomic fat within the sinus tarsi. Bones: Prominent bone infarct within the posterior calcaneal body. No bony erosion. No marrow edema or periostitis. No acute fracture or malalignment. Talonavicular osteoarthritis with prominent dorsal spurring at  the talus. No suspicious marrow replacing bone lesion. Other: Shallow soft tissue ulceration at the plantar aspect of the hindfoot (series 9, image 13). No associated sinus tract or fluid collection. Circumferential subcutaneous edema throughout the visualized lower leg, ankle, and foot. No evidence of abscess. IMPRESSION: IMPRESSION 1. Shallow soft tissue ulceration at the plantar aspect of the left hindfoot. No associated sinus tract, fluid collection, or osteomyelitis of the subjacent calcaneus. 2. Circumferential subcutaneous edema throughout the visualized lower leg, ankle, and foot. 3. Mild distal Achilles tendinosis without tear. 4. Incidentally noted calcaneal bone infarction. Electronically Signed   By: Davina Poke D.O.   On: 02/12/2021 10:27   US ARTERIAL ABI (SCREENING LOWER EXTREMITY)  Result Date: 02/12/2021 CLINICAL DATA:  Left foot pain and swelling for 1 month. Left foot osteomyelitis. Nonhealing wound for 1 month. EXAM:  NONINVASIVE PHYSIOLOGIC VASCULAR STUDY OF BILATERAL LOWER EXTREMITIES TECHNIQUE: Evaluation of both lower extremities were performed at rest, including calculation of ankle-brachial indices with single level Doppler, pressure and pulse volume recording. COMPARISON:  None. FINDINGS: Right ABI:  0.79 Left ABI:  0.75 Right Lower Extremity:  Normal arterial waveforms at the ankle. Left Lower Extremity: Monophasic waveform seen in the posterior tibial and dorsalis pedis arteries. 0.5-0.79 Moderate PAD IMPRESSION: ABI findings indicative of moderate bilateral lower extremity arterial occlusive disease. Electronically Signed   By: Miachel Roux M.D.   On: 02/12/2021 09:38        Scheduled Meds:  aspirin EC  81 mg Oral QPM   atorvastatin  40 mg Oral Daily   busPIRone  5 mg Oral BID   collagenase   Topical BID   feeding supplement  237 mL Oral BID BM   furosemide  40 mg Oral Daily   heparin  5,000 Units Subcutaneous Q8H   insulin aspart  0-5 Units Subcutaneous QHS    insulin aspart  0-9 Units Subcutaneous TID WC   pantoprazole  40 mg Oral Daily   pyridostigmine  60 mg Oral TID   Continuous Infusions:  ceFEPime (MAXIPIME) IV 2 g (02/13/21 0919)   vancomycin Stopped (02/12/21 2037)     LOS: 2 days    Time spent: 57mins    Kathie Dike, MD [facility]   If 7PM-7AM, please contact night-coverage [email or web address]  02/13/2021, 2:51 PM

## 2021-02-13 NOTE — Progress Notes (Signed)
Inpatient Diabetes Program Recommendations  AACE/ADA: New Consensus Statement on Inpatient Glycemic Control   Target Ranges:  Prepandial:   less than 140 mg/dL      Peak postprandial:   less than 180 mg/dL (1-2 hours)      Critically ill patients:  140 - 180 mg/dL  Results for KAMEISHA, MALICKI (MRN [id number]) as of 02/13/2021 11:17  Ref. Range 02/12/2021 08:10 02/13/2021 05:31  Glucose-Capillary Latest Ref Range: 70 - 99 mg/dL 95 132 (H)   Results for KATERA, RYBKA (MRN [id number]) as of 02/13/2021 11:17  Ref. Range 05/12/2020 11:09 07/14/2020 10:19 09/16/2020 10:21  Hemoglobin A1C Latest Ref Range: <5.7 % of total Hgb 6.0 (H) 10.2 (H) 8.7 (H)    Review of Glycemic Control  Diabetes history: DM2 Outpatient Diabetes medications: Metformin 500 mg BID (not taking; needs refill per home med list) Current orders for Inpatient glycemic control: CBGs  Inpatient Diabetes Program Recommendations:    Insulin: While inpatient, please consider ordering CBGs ACHS with Novolog 0-9 units TID with meals and Novolog 0-5 units QHS.  NOTE: Noted consult for Diabetes Coordinator. Diabetes Coordinator is not on campus over the weekend but available by pager from 8am to 5pm for questions or concerns. Chart reviewed. Noted patient seen PCP 09/22/20 and per note patient dx with steroid induced DM "intermin A1C is much improved, she has also lowered dose of prednisone. Continue metformin 500mg  BID." Called patient's room to discuss but no answer after several attempt this morning.  Would recommend ordering Novolog correction scale while inpatient. Will continue to follow along while inpatient.  Thanks, Barnie Alderman, RN, MSN, CDE Diabetes Coordinator Inpatient Diabetes Program [phone] (Team Pager from 8am to 5pm)

## 2021-02-14 DIAGNOSIS — G4733 Obstructive sleep apnea (adult) (pediatric): Secondary | ICD-10-CM | POA: Diagnosis not present

## 2021-02-14 DIAGNOSIS — E782 Mixed hyperlipidemia: Secondary | ICD-10-CM | POA: Diagnosis not present

## 2021-02-14 DIAGNOSIS — E1159 Type 2 diabetes mellitus with other circulatory complications: Secondary | ICD-10-CM | POA: Diagnosis not present

## 2021-02-14 LAB — CBC
HCT: 25.4 % — ABNORMAL LOW (ref 36.0–46.0)
Hemoglobin: 8.6 g/dL — ABNORMAL LOW (ref 12.0–15.0)
MCH: 31.5 pg (ref 26.0–34.0)
MCHC: 33.9 g/dL (ref 30.0–36.0)
MCV: 93 fL (ref 80.0–100.0)
Platelets: 302 10*3/uL (ref 150–400)
RBC: 2.73 MIL/uL — ABNORMAL LOW (ref 3.87–5.11)
RDW: 17.2 % — ABNORMAL HIGH (ref 11.5–15.5)
WBC: 15.1 10*3/uL — ABNORMAL HIGH (ref 4.0–10.5)
nRBC: 0 % (ref 0.0–0.2)

## 2021-02-14 LAB — BASIC METABOLIC PANEL
Anion gap: 9 (ref 5–15)
BUN: 14 mg/dL (ref 8–23)
CO2: 19 mmol/L — ABNORMAL LOW (ref 22–32)
Calcium: 8.2 mg/dL — ABNORMAL LOW (ref 8.9–10.3)
Chloride: 110 mmol/L (ref 98–111)
Creatinine, Ser: 1.18 mg/dL — ABNORMAL HIGH (ref 0.44–1.00)
GFR, Estimated: 49 mL/min — ABNORMAL LOW (ref >60)
Glucose, Bld: 82 mg/dL (ref 70–99)
Potassium: 4 mmol/L (ref 3.5–5.1)
Sodium: 138 mmol/L (ref 135–145)

## 2021-02-14 LAB — GLUCOSE, CAPILLARY
Glucose-Capillary: 74 mg/dL (ref 70–99)
Glucose-Capillary: 85 mg/dL (ref 70–99)
Glucose-Capillary: 101 mg/dL — ABNORMAL HIGH (ref 70–99)
Glucose-Capillary: 93 mg/dL (ref 70–99)

## 2021-02-14 MED ORDER — SODIUM CHLORIDE 0.9 % IV SOLN
2.0000 g | Freq: Two times a day (BID) | INTRAVENOUS | Status: DC
  Administered 2021-02-14 – 2021-02-16 (×4): 2 g via INTRAVENOUS
  Filled 2021-02-14 (×4): qty 2

## 2021-02-14 MED ORDER — ADULT MULTIVITAMIN W/MINERALS CH
1.0000 | ORAL_TABLET | Freq: Every day | ORAL | Status: DC
  Administered 2021-02-14 – 2021-02-24 (×10): 1 via ORAL
  Filled 2021-02-14 (×9): qty 1

## 2021-02-14 MED ORDER — LACTATED RINGERS IV SOLN: INTRAVENOUS | Status: AC

## 2021-02-14 MED ORDER — JUVEN PO PACK
1.0000 | PACK | Freq: Two times a day (BID) | ORAL | Status: DC
  Administered 2021-02-14 – 2021-02-18 (×5): 1 via ORAL
  Filled 2021-02-14 (×5): qty 1

## 2021-02-14 NOTE — Progress Notes (Signed)
Initial Nutrition Assessment  DOCUMENTATION CODES:   Not applicable  INTERVENTION:   Follow-up and provide DM diet education  Juven BID, each packet provides 80 calories, 8 grams of carbohydrate, 2.5  grams of protein (collagen), 7 grams of L-arginine and 7 grams of L-glutamine; supplement contains CaHMB, Vitamins C, E, B12 and Zinc to promote wound healing  MVI with Minerals  NUTRITION DIAGNOSIS:   Increased nutrient needs related to wound healing as evidenced by estimated needs.  GOAL:   Patient will meet greater than or equal to 90% of their needs  MONITOR:   PO intake, Supplement acceptance, Labs, Weight trends  REASON FOR ASSESSMENT:   Consult Wound healing  ASSESSMENT:   70 yo female admitted with nonhealing wound of left heel. PMH includes DM, chronic lymphedema, OSA, GERD  Noted plans for arteriogram on Monday. RD observed photos of heel wound in pt chart  Attempted to reach pt via phone but unsuccessful  Recorded po intake 75% at dinner on 7/22; no other po intake recorded  New HGbA1c pending Lab Results  Component Value Date   HGBA1C 8.7 (H) 09/16/2020   Possible weight loss noted per weight encounters; current wt 108.4 kg. Weight of 121.6 kg in June, 115.2 kg in May  Labs: CBGs 74-132 Meds: reviewed; ss novolog, lasix   NUTRITION - FOCUSED PHYSICAL EXAM:  Unable to assess  Diet Order:   Diet Order             Diet NPO time specified Except for: Sips with Meds  Diet effective midnight           Diet heart healthy/carb modified Room service appropriate? Yes; Fluid consistency: Thin  Diet effective now                   EDUCATION NEEDS:   Education needs have been addressed  Skin:  Skin Assessment: Skin Integrity Issues: Skin Integrity Issues:: Other (Comment) Other: non healing wound on L heel  Last BM:  7/22  Height:   Ht Readings from Last 1 Encounters:  02/11/21 5\' 5"  (1.651 m)    Weight:   Wt Readings from Last 1  Encounters:  02/11/21 108.4 kg    BMI:  Body mass index is 39.77 kg/m.  Estimated Nutritional Needs:   Kcal:  1750-1950 kcals  Protein:  105-115 g  Fluid:  >/= 1.8 L   Kerman Passey MS, RDN, LDN, CNSC Registered Dietitian III Clinical Nutrition RD Pager and On-Call Pager Number Located in [place]

## 2021-02-14 NOTE — Progress Notes (Signed)
PROGRESS NOTE    [name]  IDP:[id number] DOB: July 30, 1950 DOA: 02/11/2021 PCP: Leslie Andrea, MD    Brief Narrative:  70 y/o female sent to ED from wound care center for worsening of L foot wound. There was concern for underlying osteomyelitis since per outpatient provider, wound could be probed to the bone. MRI foot did not show any osteomyelitis or abscess. ABI were 0.75, but likely falsely elevated. Since here wound was not healing despite wound care and antibiotics, she as seen by vascular with plans for arteriogram.   Assessment & Plan:   Active Problems:   HLD (hyperlipidemia)   OBSTRUCTIVE SLEEP APNEA   Ocular myasthenia gravis (HCC)   Osteomyelitis (HCC)   Hypomagnesemia   Social problem   Open wound of plantar aspect of foot   DM2 (diabetes mellitus, type 2) (HCC)   Nonhealing wound of left heel and cellulitis -She was sent to the emergency room from the wound care center when it was noted that her wound was progressively getting worse and wound care MD was able to probe wound down to bone -There was also noted to have purulent drainage and foul smell from the wound -Wound cultures performed as an outpatient were positive for Serratia and she was treated with approximately 10 days of ciprofloxacin without improvement -She has been started on intravenous antibiotics for now with vancomycin and cefepime -Repeat wound culture in process -MRI of the foot did not show any evidence of underlying osteomyelitis or abscess -Seen by general surgery and it was not felt that she would tolerate sharp debridement therefore she has been started on Santyl twice daily, will also request WOC input -ABIs found to be 0.75 on the left with monophasic waveforms -vascular surgery following with plans for arteriogram on Monday  Diabetes -She is chronically on metformin -Last A1c from 08/2020 noted to be 8.7 -Repeat A1c pending -Continue on sliding scale insulin for now    Lymphedema -Currently on home dose of Lasix -Overall swelling appears to have improved   Obstructive sleep apnea -Continue on CPAP   Ocular myasthenia gravis -Continue on pyridostigmine   GERD -Continue PPI  Nausea/epigastric discomfort -EKG negative -improved after zofran -suspect GI etiology -she is already on PPI -will give a GI cocktail   Hyperlipidemia -Continue statin   Generalized weakness -Physical therapy evaluation with recommendations for home health PT -May need to reevaluate further and her hospital course since it does not appear that she has adequate supervision at home   DVT prophylaxis: heparin injection 5,000 Units Start: 02/11/21 2300 SCDs Start: 02/11/21 2300  Code Status: full code Family Communication: updated daughter on phone on 7/22 Disposition Plan: Status is: Inpatient  Remains inpatient appropriate because:Ongoing diagnostic testing needed not appropriate for outpatient work up, IV treatments appropriate due to intensity of illness or inability to take PO, and Inpatient level of care appropriate due to severity of illness  Dispo: The patient is from: Home              Anticipated d/c is to: Home              Patient currently is not medically stable to d/c.   Difficult to place patient No         Consultants:  Vascular surgery  Procedures:    Antimicrobials:  Vancomycin 7/22> Cefepime 7/22>    Subjective: Overall feels the pain in her foot is better.  She is tired today.  Objective: Vitals:   02/14/21 0310  02/14/21 0746 02/14/21 1230 02/14/21 1634  BP: (!) 117/48 (!) 123/41 (!) 131/47 (!) 122/48  Pulse: 83 83 76 77  Resp: 18 18 18 18   Temp: 97.6 F (36.4 C) (!) 97.3 F (36.3 C) (!) 97.5 F (36.4 C)   TempSrc: Oral Oral Oral Axillary  SpO2: 100% 100% 100% 96%  Weight:      Height:        Intake/Output Summary (Last 24 hours) at 02/14/2021 1705 Last data filed at 02/14/2021 1231 Gross per 24 hour  Intake 240  ml  Output 1650 ml  Net -1410 ml   Filed Weights   02/11/21 1909  Weight: 108.4 kg    Examination:  General exam: Appears to be more somnolent today, wakes up to voice, no distress Respiratory system: Clear to auscultation. Respiratory effort normal. Cardiovascular system:RRR. No murmurs, rubs, gallops. Gastrointestinal system: Abdomen is nondistended, soft and nontender. No organomegaly or masses felt. Normal bowel sounds heard. Central nervous system: Alert and oriented. No focal neurological deficits. Extremities: Left heel with dressing Skin: No rashes, lesions or ulcers Psychiatry: Judgement and insight appear normal. Mood & affect appropriate.      Data Reviewed: I have personally reviewed following labs and imaging studies  CBC: Recent Labs  Lab 02/11/21 1432 02/12/21 0349 02/14/21 0124  WBC 10.9* 10.4 15.1*  NEUTROABS 7.7  --   --   HGB 10.0* 9.1* 8.6*  HCT 29.6* 27.2* 25.4*  MCV 93.1 94.1 93.0  PLT 378 347 622   Basic Metabolic Panel: Recent Labs  Lab 02/11/21 1432 02/12/21 0349 02/14/21 0124  NA 134* 135 138  K 4.3 4.0 4.0  CL 104 108 110  CO2 22 20* 19*  GLUCOSE 98 93 82  BUN 16 15 14   CREATININE 1.02* 0.96 1.18*  CALCIUM 8.2* 8.1* 8.2*  MG 1.3* 1.9  --    GFR: Estimated Creatinine Clearance: 53.6 mL/min (A) (by C-G formula based on SCr of 1.18 mg/dL (H)). Liver Function Tests: Recent Labs  Lab 02/12/21 0349  AST 27  ALT 17  ALKPHOS 74  BILITOT 0.6  PROT 6.0*  ALBUMIN 2.6*   No results for input(s): LIPASE, AMYLASE in the last 168 hours. No results for input(s): AMMONIA in the last 168 hours. Coagulation Profile: No results for input(s): INR, PROTIME in the last 168 hours. Cardiac Enzymes: No results for input(s): CKTOTAL, CKMB, CKMBINDEX, TROPONINI in the last 168 hours. BNP (last 3 results) No results for input(s): PROBNP in the last 8760 hours. HbA1C: No results for input(s): HGBA1C in the last 72 hours. CBG: Recent Labs   Lab 02/13/21 1629 02/13/21 2130 02/14/21 0613 02/14/21 1223 02/14/21 1629  GLUCAP 84 106* 74 85 101*   Lipid Profile: No results for input(s): CHOL, HDL, LDLCALC, TRIG, CHOLHDL, LDLDIRECT in the last 72 hours. Thyroid Function Tests: No results for input(s): TSH, T4TOTAL, FREET4, T3FREE, THYROIDAB in the last 72 hours. Anemia Panel: No results for input(s): VITAMINB12, FOLATE, FERRITIN, TIBC, IRON, RETICCTPCT in the last 72 hours. Sepsis Labs: No results for input(s): PROCALCITON, LATICACIDVEN in the last 168 hours.  Recent Results (from the past 240 hour(s))  Culture, blood (routine x 2)     Status: None (Preliminary result)   Collection Time: 02/11/21  2:37 PM   Specimen: Right Antecubital; Blood  Result Value Ref Range Status   Specimen Description   Final    RIGHT ANTECUBITAL BOTTLES DRAWN AEROBIC AND ANAEROBIC   Special Requests Blood Culture adequate volume  Final  Culture   Final    NO GROWTH 3 DAYS Performed at [facility], [address], [place], [place] [address]    Report Status PENDING  Incomplete  Resp Panel by RT-PCR (Flu A&B, Covid) Nasopharyngeal Swab     Status: None   Collection Time: 02/11/21  3:31 PM   Specimen: Nasopharyngeal Swab; Nasopharyngeal(NP) swabs in vial transport medium  Result Value Ref Range Status   SARS Coronavirus 2 by RT PCR NEGATIVE NEGATIVE Final    Comment: (NOTE) SARS-CoV-2 target nucleic acids are NOT DETECTED.  The SARS-CoV-2 RNA is generally detectable in upper respiratory specimens during the acute phase of infection. The lowest concentration of SARS-CoV-2 viral copies this assay can detect is 138 copies/mL. A negative result does not preclude SARS-Cov-2 infection and should not be used as the sole basis for treatment or other patient management decisions. A negative result may occur with  improper specimen collection/handling, submission of specimen other than nasopharyngeal swab, presence of viral mutation(s) within  the areas targeted by this assay, and inadequate number of viral copies(<138 copies/mL). A negative result must be combined with clinical observations, patient history, and epidemiological information. The expected result is Negative.  Fact Sheet for Patients:  EntrepreneurPulse.com.au  Fact Sheet for Healthcare Providers:  IncredibleEmployment.be  This test is no t yet approved or cleared by the [place] FDA and  has been authorized for detection and/or diagnosis of SARS-CoV-2 by FDA under an Emergency Use Authorization (EUA). This EUA will remain  in effect (meaning this test can be used) for the duration of the COVID-19 declaration under Section 564(b)(1) of the Act, 21 U.S.C.section 360bbb-3(b)(1), unless the authorization is terminated  or revoked sooner.       Influenza A by PCR NEGATIVE NEGATIVE Final   Influenza B by PCR NEGATIVE NEGATIVE Final    Comment: (NOTE) The Xpert Xpress SARS-CoV-2/FLU/RSV plus assay is intended as an aid in the diagnosis of influenza from Nasopharyngeal swab specimens and should not be used as a sole basis for treatment. Nasal washings and aspirates are unacceptable for Xpert Xpress SARS-CoV-2/FLU/RSV testing.  Fact Sheet for Patients: EntrepreneurPulse.com.au  Fact Sheet for Healthcare Providers: IncredibleEmployment.be  This test is not yet approved or cleared by the [place] FDA and has been authorized for detection and/or diagnosis of SARS-CoV-2 by FDA under an Emergency Use Authorization (EUA). This EUA will remain in effect (meaning this test can be used) for the duration of the COVID-19 declaration under Section 564(b)(1) of the Act, 21 U.S.C. section 360bbb-3(b)(1), unless the authorization is terminated or revoked.  Performed at [facility], [address], [place], [place] [address]   Culture, blood (routine x 2)     Status: None (Preliminary  result)   Collection Time: 02/11/21  3:54 PM   Specimen: Left Antecubital; Blood  Result Value Ref Range Status   Specimen Description   Final    LEFT ANTECUBITAL BOTTLES DRAWN AEROBIC AND ANAEROBIC   Special Requests Blood Culture adequate volume  Final   Culture   Final    NO GROWTH 3 DAYS Performed at [facility], [address], [place], [place] [address]    Report Status PENDING  Incomplete         Radiology Studies: No results found.      Scheduled Meds:  aspirin EC  81 mg Oral QPM   atorvastatin  40 mg Oral Daily   busPIRone  5 mg Oral BID   collagenase   Topical BID  feeding supplement  237 mL Oral BID BM   furosemide  40 mg Oral Daily   heparin  5,000 Units Subcutaneous Q8H   insulin aspart  0-5 Units Subcutaneous QHS   insulin aspart  0-9 Units Subcutaneous TID WC   multivitamin with minerals  1 tablet Oral Daily   nutrition supplement (JUVEN)  1 packet Oral BID BM   pantoprazole  40 mg Oral Daily   pyridostigmine  60 mg Oral TID   Continuous Infusions:  ceFEPime (MAXIPIME) IV     lactated ringers     vancomycin 1,500 mg (02/13/21 1716)     LOS: 3 days    Time spent: 31mins    Kathie Dike, MD [facility]   If 7PM-7AM, please contact night-coverage [email or web address]  02/14/2021, 5:05 PM

## 2021-02-14 NOTE — Progress Notes (Signed)
Pharmacy Antibiotic Note  [name] is a 70 y.o. female admitted on 02/11/2021 with  possible osteomyelitis .  Pharmacy has been consulted for vancomycin and cefepime dosing.  Assessment: Vancomycin 2g load given 7/21, followed by 1500 mg daily. Patient renal function has declined and antibiotics dosing requires adjustment.  Plan: Continue vancomycin 1500 mg daily Check vancomycin peak and trough if therapy is indicated after angiogram on 7/25 Adjust Cefepime to 2000 mg every 12 hours  Height: 5\' 5"  (165.1 cm) Weight: 108.4 kg (239 lb) IBW/kg (Calculated) : 57  Temp (24hrs), Avg:97.5 F (36.4 C), Min:97.3 F (36.3 C), Max:97.7 F (36.5 C)  Recent Labs  Lab 02/11/21 1432 02/12/21 0349 02/14/21 0124  WBC 10.9* 10.4 15.1*  CREATININE 1.02* 0.96 1.18*    Estimated Creatinine Clearance: 53.6 mL/min (A) (by C-G formula based on SCr of 1.18 mg/dL (H)).    No Known Allergies  Antimicrobials this admission: Vancomycin 7/21 >>  Cefepime 7/21 >>    Microbiology results: 7/21 BCx: no growth to date  Thank you for allowing pharmacy to participate in this patient's care.  Reatha Harps, PharmD PGY1 Pharmacy Resident 02/14/2021 12:06 PM Check [email or web address] for unit specific pharmacy number

## 2021-02-14 NOTE — Progress Notes (Signed)
   NPO past MN Plan left lower extremity angiogram via right common femoral artery approach 02/15/2021 with Dr. Donzetta Matters.   Patient agrees with plan  Roxy Horseman PA-C

## 2021-02-15 ENCOUNTER — Encounter (HOSPITAL_COMMUNITY)
Admission: EM | Disposition: A | Discharge status: Skilled Nursing Facility | Payer: Self-pay | Source: Home / Self Care | Attending: Internal Medicine

## 2021-02-15 ENCOUNTER — Inpatient Hospital Stay (HOSPITAL_COMMUNITY): Payer: Medicare Other

## 2021-02-15 ENCOUNTER — Inpatient Hospital Stay: Payer: Self-pay

## 2021-02-15 ENCOUNTER — Encounter (HOSPITAL_COMMUNITY): Payer: Medicare Other

## 2021-02-15 DIAGNOSIS — S91302A Unspecified open wound, left foot, initial encounter: Secondary | ICD-10-CM | POA: Diagnosis not present

## 2021-02-15 DIAGNOSIS — I70244 Atherosclerosis of native arteries of left leg with ulceration of heel and midfoot: Secondary | ICD-10-CM

## 2021-02-15 DIAGNOSIS — E1159 Type 2 diabetes mellitus with other circulatory complications: Secondary | ICD-10-CM | POA: Diagnosis not present

## 2021-02-15 DIAGNOSIS — E782 Mixed hyperlipidemia: Secondary | ICD-10-CM | POA: Diagnosis not present

## 2021-02-15 DIAGNOSIS — L97429 Non-pressure chronic ulcer of left heel and midfoot with unspecified severity: Secondary | ICD-10-CM

## 2021-02-15 DIAGNOSIS — D72829 Elevated white blood cell count, unspecified: Secondary | ICD-10-CM | POA: Diagnosis not present

## 2021-02-15 HISTORY — PX: PERIPHERAL VASCULAR BALLOON ANGIOPLASTY: CATH118281

## 2021-02-15 HISTORY — PX: ABDOMINAL AORTOGRAM W/LOWER EXTREMITY: CATH118223

## 2021-02-15 HISTORY — PX: PERIPHERAL VASCULAR INTERVENTION: CATH118257

## 2021-02-15 LAB — GLUCOSE, CAPILLARY
Glucose-Capillary: 74 mg/dL (ref 70–99)
Glucose-Capillary: 68 mg/dL — ABNORMAL LOW (ref 70–99)
Glucose-Capillary: 92 mg/dL (ref 70–99)
Glucose-Capillary: 65 mg/dL — ABNORMAL LOW (ref 70–99)
Glucose-Capillary: 94 mg/dL (ref 70–99)
Glucose-Capillary: 83 mg/dL (ref 70–99)
Glucose-Capillary: 77 mg/dL (ref 70–99)
Glucose-Capillary: 82 mg/dL (ref 70–99)

## 2021-02-15 LAB — CBC WITH DIFFERENTIAL/PLATELET
Abs Immature Granulocytes: 0.22 10*3/uL — ABNORMAL HIGH (ref 0.00–0.07)
Basophils Absolute: 0.1 10*3/uL (ref 0.0–0.1)
Basophils Relative: 0 %
Eosinophils Absolute: 0.4 10*3/uL (ref 0.0–0.5)
Eosinophils Relative: 2 %
HCT: 25.3 % — ABNORMAL LOW (ref 36.0–46.0)
Hemoglobin: 8.9 g/dL — ABNORMAL LOW (ref 12.0–15.0)
Immature Granulocytes: 1 %
Lymphocytes Relative: 3 %
Lymphs Abs: 0.9 10*3/uL (ref 0.7–4.0)
MCH: 32 pg (ref 26.0–34.0)
MCHC: 35.2 g/dL (ref 30.0–36.0)
MCV: 91 fL (ref 80.0–100.0)
Monocytes Absolute: 1.7 10*3/uL — ABNORMAL HIGH (ref 0.1–1.0)
Monocytes Relative: 6 %
Neutro Abs: 23.7 10*3/uL — ABNORMAL HIGH (ref 1.7–7.7)
Neutrophils Relative %: 88 %
Platelets: 323 10*3/uL (ref 150–400)
RBC: 2.78 MIL/uL — ABNORMAL LOW (ref 3.87–5.11)
RDW: 17.3 % — ABNORMAL HIGH (ref 11.5–15.5)
WBC: 27 10*3/uL — ABNORMAL HIGH (ref 4.0–10.5)
nRBC: 0.2 % (ref 0.0–0.2)

## 2021-02-15 LAB — BLOOD GAS, ARTERIAL
Acid-base deficit: 4.7 mmol/L — ABNORMAL HIGH (ref 0.0–2.0)
Acid-base deficit: 5.8 mmol/L — ABNORMAL HIGH (ref 0.0–2.0)
Bicarbonate: 20.7 mmol/L (ref 20.0–28.0)
Bicarbonate: 20.1 mmol/L (ref 20.0–28.0)
Drawn by: 30136
Drawn by: 270271
FIO2: 24
FIO2: 24
O2 Saturation: 97.7 %
O2 Saturation: 98.7 %
Patient temperature: 36.9
Patient temperature: 37
pCO2 arterial: 43.5 mmHg (ref 32.0–48.0)
pCO2 arterial: 46.5 mmHg (ref 32.0–48.0)
pH, Arterial: 7.298 — ABNORMAL LOW (ref 7.350–7.450)
pH, Arterial: 7.259 — ABNORMAL LOW (ref 7.350–7.450)
pO2, Arterial: 102 mmHg (ref 83.0–108.0)
pO2, Arterial: 135 mmHg — ABNORMAL HIGH (ref 83.0–108.0)

## 2021-02-15 LAB — RENAL FUNCTION PANEL
Albumin: 1.9 g/dL — ABNORMAL LOW (ref 3.5–5.0)
Anion gap: 7 (ref 5–15)
BUN: 24 mg/dL — ABNORMAL HIGH (ref 8–23)
CO2: 21 mmol/L — ABNORMAL LOW (ref 22–32)
Calcium: 8.5 mg/dL — ABNORMAL LOW (ref 8.9–10.3)
Chloride: 110 mmol/L (ref 98–111)
Creatinine, Ser: 1.14 mg/dL — ABNORMAL HIGH (ref 0.44–1.00)
GFR, Estimated: 51 mL/min — ABNORMAL LOW (ref >60)
Glucose, Bld: 77 mg/dL (ref 70–99)
Phosphorus: 2.7 mg/dL (ref 2.5–4.6)
Potassium: 3.9 mmol/L (ref 3.5–5.1)
Sodium: 138 mmol/L (ref 135–145)

## 2021-02-15 LAB — LACTIC ACID, PLASMA
Lactic Acid, Venous: 0.9 mmol/L (ref 0.5–1.9)
Lactic Acid, Venous: 0.8 mmol/L (ref 0.5–1.9)

## 2021-02-15 LAB — HEMOGLOBIN A1C
Hgb A1c MFr Bld: 4.9 % (ref 4.8–5.6)
Mean Plasma Glucose: 94 mg/dL

## 2021-02-15 LAB — CREATININE, SERUM
Creatinine, Ser: 1.18 mg/dL — ABNORMAL HIGH (ref 0.44–1.00)
GFR, Estimated: 49 mL/min — ABNORMAL LOW (ref >60)

## 2021-02-15 LAB — CBC
HCT: 25.7 % — ABNORMAL LOW (ref 36.0–46.0)
Hemoglobin: 8.7 g/dL — ABNORMAL LOW (ref 12.0–15.0)
MCH: 31.2 pg (ref 26.0–34.0)
MCHC: 33.9 g/dL (ref 30.0–36.0)
MCV: 92.1 fL (ref 80.0–100.0)
Platelets: 312 10*3/uL (ref 150–400)
RBC: 2.79 MIL/uL — ABNORMAL LOW (ref 3.87–5.11)
RDW: 16.9 % — ABNORMAL HIGH (ref 11.5–15.5)
WBC: 28.4 10*3/uL — ABNORMAL HIGH (ref 4.0–10.5)
nRBC: 0.1 % (ref 0.0–0.2)

## 2021-02-15 LAB — PROCALCITONIN: Procalcitonin: 0.46 ng/mL

## 2021-02-15 LAB — SURGICAL PCR SCREEN
MRSA, PCR: NEGATIVE
Staphylococcus aureus: NEGATIVE

## 2021-02-15 LAB — AMMONIA: Ammonia: 38 umol/L — ABNORMAL HIGH (ref 9–35)

## 2021-02-15 LAB — POCT ACTIVATED CLOTTING TIME: Activated Clotting Time: 242 seconds

## 2021-02-15 SURGERY — ABDOMINAL AORTOGRAM W/LOWER EXTREMITY
Anesthesia: LOCAL

## 2021-02-15 MED ORDER — LIDOCAINE HCL (PF) 1 % IJ SOLN
INTRAMUSCULAR | Status: DC | PRN
  Administered 2021-02-15: 15 mL via INTRADERMAL

## 2021-02-15 MED ORDER — SODIUM CHLORIDE 0.9% FLUSH
3.0000 mL | INTRAVENOUS | Status: DC | PRN
  Administered 2021-02-20: 3 mL via INTRAVENOUS

## 2021-02-15 MED ORDER — MIDAZOLAM HCL 2 MG/2ML IJ SOLN
INTRAMUSCULAR | Status: AC
  Filled 2021-02-15: qty 2

## 2021-02-15 MED ORDER — HEPARIN (PORCINE) IN NACL 1000-0.9 UT/500ML-% IV SOLN
INTRAVENOUS | Status: DC | PRN
  Administered 2021-02-15 (×2): 500 mL

## 2021-02-15 MED ORDER — HEPARIN (PORCINE) IN NACL 1000-0.9 UT/500ML-% IV SOLN
INTRAVENOUS | Status: AC
  Filled 2021-02-15: qty 1000

## 2021-02-15 MED ORDER — SODIUM CHLORIDE 0.9 % IV SOLN: 250.0000 mL | INTRAVENOUS | Status: DC | PRN

## 2021-02-15 MED ORDER — CLOPIDOGREL BISULFATE 300 MG PO TABS
ORAL_TABLET | ORAL | Status: DC | PRN
  Administered 2021-02-15: 300 mg via ORAL

## 2021-02-15 MED ORDER — CLOPIDOGREL BISULFATE 75 MG PO TABS: 300.0000 mg | ORAL_TABLET | Freq: Once | ORAL | Status: AC

## 2021-02-15 MED ORDER — FENTANYL CITRATE (PF) 100 MCG/2ML IJ SOLN
INTRAMUSCULAR | Status: DC | PRN
  Administered 2021-02-15: 25 ug via INTRAVENOUS

## 2021-02-15 MED ORDER — HYDRALAZINE HCL 20 MG/ML IJ SOLN
5.0000 mg | INTRAMUSCULAR | Status: DC | PRN
Start: 2021-02-15 — End: 2021-02-25

## 2021-02-15 MED ORDER — LABETALOL HCL 5 MG/ML IV SOLN
10.0000 mg | INTRAVENOUS | Status: DC | PRN
Start: 2021-02-15 — End: 2021-02-25

## 2021-02-15 MED ORDER — ONDANSETRON HCL 4 MG/2ML IJ SOLN: 4.0000 mg | Freq: Four times a day (QID) | INTRAMUSCULAR | Status: DC | PRN

## 2021-02-15 MED ORDER — DEXTROSE-NACL 5-0.9 % IV SOLN: INTRAVENOUS | Status: DC

## 2021-02-15 MED ORDER — SODIUM CHLORIDE 0.9 % IV SOLN: INTRAVENOUS | Status: DC

## 2021-02-15 MED ORDER — ACETAMINOPHEN 325 MG PO TABS
650.0000 mg | ORAL_TABLET | ORAL | Status: DC | PRN
  Administered 2021-02-19 – 2021-02-23 (×3): 650 mg via ORAL
  Filled 2021-02-15 (×3): qty 2

## 2021-02-15 MED ORDER — DEXTROSE 50 % IV SOLN
12.5000 g | INTRAVENOUS | Status: AC
  Administered 2021-02-15: 12.5 g via INTRAVENOUS

## 2021-02-15 MED ORDER — IODIXANOL 320 MG/ML IV SOLN
INTRAVENOUS | Status: DC | PRN
  Administered 2021-02-15: 150 mL via INTRA_ARTERIAL

## 2021-02-15 MED ORDER — HEPARIN SODIUM (PORCINE) 1000 UNIT/ML IJ SOLN
INTRAMUSCULAR | Status: DC | PRN
  Administered 2021-02-15: 10000 [IU] via INTRAVENOUS

## 2021-02-15 MED ORDER — NALOXONE HCL 0.4 MG/ML IJ SOLN
0.4000 mg | INTRAMUSCULAR | Status: DC | PRN
  Administered 2021-02-15: 0.4 mg via INTRAVENOUS

## 2021-02-15 MED ORDER — DEXTROSE 50 % IV SOLN: 12.5000 g | INTRAVENOUS | Status: AC

## 2021-02-15 MED ORDER — SODIUM CHLORIDE 0.9% FLUSH
3.0000 mL | Freq: Two times a day (BID) | INTRAVENOUS | Status: DC
  Administered 2021-02-16 – 2021-02-24 (×13): 3 mL via INTRAVENOUS

## 2021-02-15 MED ORDER — SODIUM CHLORIDE 0.9 % WEIGHT BASED INFUSION
1.0000 mL/kg/h | INTRAVENOUS | Status: AC
  Administered 2021-02-15: 1 mL/kg/h via INTRAVENOUS

## 2021-02-15 MED ORDER — ACETAMINOPHEN 650 MG RE SUPP: 650.0000 mg | Freq: Four times a day (QID) | RECTAL | Status: DC | PRN

## 2021-02-15 MED ORDER — ACETAMINOPHEN 325 MG PO TABS
650.0000 mg | ORAL_TABLET | Freq: Four times a day (QID) | ORAL | Status: DC | PRN
  Administered 2021-02-23 – 2021-02-24 (×2): 650 mg via ORAL
  Filled 2021-02-15 (×2): qty 2

## 2021-02-15 MED ORDER — LIDOCAINE HCL (PF) 1 % IJ SOLN
INTRAMUSCULAR | Status: AC
  Filled 2021-02-15: qty 30

## 2021-02-15 MED ORDER — FENTANYL CITRATE (PF) 100 MCG/2ML IJ SOLN
INTRAMUSCULAR | Status: AC
  Filled 2021-02-15: qty 2

## 2021-02-15 MED ORDER — DEXTROSE 50 % IV SOLN
INTRAVENOUS | Status: AC
  Administered 2021-02-15: 12.5 g via INTRAVENOUS
  Filled 2021-02-15: qty 50

## 2021-02-15 MED ORDER — HEPARIN SODIUM (PORCINE) 5000 UNIT/ML IJ SOLN
5000.0000 [IU] | Freq: Three times a day (TID) | INTRAMUSCULAR | Status: DC
  Administered 2021-02-15: 5000 [IU] via SUBCUTANEOUS
  Filled 2021-02-15: qty 1

## 2021-02-15 MED ORDER — DEXTROSE 50 % IV SOLN
INTRAVENOUS | Status: AC
  Filled 2021-02-15: qty 50

## 2021-02-15 MED ORDER — CLOPIDOGREL BISULFATE 75 MG PO TABS: 75.0000 mg | ORAL_TABLET | Freq: Every day | ORAL | Status: DC

## 2021-02-15 MED ORDER — MIDAZOLAM HCL 2 MG/2ML IJ SOLN
INTRAMUSCULAR | Status: DC | PRN
  Administered 2021-02-15: 0.5 mg via INTRAVENOUS

## 2021-02-15 SURGICAL SUPPLY — 26 items
BALLN STERLING OTW 3X40X150 (BALLOONS) ×3
BALLN STERLING OTW 5X80X135 (BALLOONS) ×3
BALLOON STERLING OTW 3X40X150 (BALLOONS) IMPLANT
BALLOON STERLING OTW 5X80X135 (BALLOONS) IMPLANT
CATH CXI SUPP 2.6F 150 ST (CATHETERS) ×1 IMPLANT
CATH OMNI FLUSH 5F 65CM (CATHETERS) ×1 IMPLANT
CATH QUICKCROSS ANG SELECT (CATHETERS) ×1 IMPLANT
CLOSURE MYNX CONTROL 6F/7F (Vascular Products) ×1 IMPLANT
DCB RANGER 4.0X40 135 (BALLOONS) IMPLANT
KIT ENCORE 26 ADVANTAGE (KITS) ×1 IMPLANT
KIT MICROPUNCTURE NIT STIFF (SHEATH) ×1 IMPLANT
KIT PV (KITS) ×3 IMPLANT
RANGER DCB 4.0X40 135 (BALLOONS) ×3
SHEATH HIGHFLEX ANSEL 6FRX55 (SHEATH) ×1 IMPLANT
SHEATH PINNACLE 5F 10CM (SHEATH) ×1 IMPLANT
SHEATH PINNACLE 6F 10CM (SHEATH) ×1 IMPLANT
SHEATH PINNACLE 7F 10CM (SHEATH) ×1 IMPLANT
SHEATH PINNACLE MP 6F 45CM (SHEATH) ×1 IMPLANT
SHEATH PROBE COVER 6X72 (BAG) ×1 IMPLANT
STENT ELUVIA 6X80X130 (Permanent Stent) ×1 IMPLANT
SYR MEDRAD MARK V 150ML (SYRINGE) ×1 IMPLANT
TRANSDUCER W/STOPCOCK (MISCELLANEOUS) ×3 IMPLANT
TRAY PV CATH (CUSTOM PROCEDURE TRAY) ×3 IMPLANT
WIRE BENTSON .035X145CM (WIRE) ×1 IMPLANT
WIRE G V18X300CM (WIRE) ×2 IMPLANT
WIRE ROSEN-J .035X180CM (WIRE) ×1 IMPLANT

## 2021-02-15 NOTE — Progress Notes (Signed)
  Progress Note    02/15/2021 12:32 PM * No surgery date entered *  Subjective: No overnight  Vitals:   02/15/21 0325 02/15/21 0810  BP: (!) 113/44 (!) 108/43  Pulse: 69 70  Resp: 16 17  Temp: 98.2 F (36.8 C) 98.3 F (36.8 C)  SpO2: 96% 97%    Physical Exam: Awake and alert Dressing left foot clean dry intact CBC    Component Value Date/Time   WBC 28.4 (H) 02/15/2021 0755   RBC 2.79 (L) 02/15/2021 0755   HGB 8.7 (L) 02/15/2021 0755   HCT 25.7 (L) 02/15/2021 0755   PLT 312 02/15/2021 0755   MCV 92.1 02/15/2021 0755   MCH 31.2 02/15/2021 0755   MCHC 33.9 02/15/2021 0755   RDW 16.9 (H) 02/15/2021 0755   LYMPHSABS 1.6 02/11/2021 1432   MONOABS 1.2 (H) 02/11/2021 1432   EOSABS 0.2 02/11/2021 1432   BASOSABS 0.1 02/11/2021 1432    BMET    Component Value Date/Time   NA 138 02/15/2021 0755   K 3.9 02/15/2021 0755   CL 110 02/15/2021 0755   CO2 21 (L) 02/15/2021 0755   GLUCOSE 77 02/15/2021 0755   BUN 24 (H) 02/15/2021 0755   CREATININE 1.18 (H) 02/15/2021 0755   CREATININE 1.14 (H) 02/15/2021 0755   CREATININE 4.23 (H) 10/01/2020 1204   CALCIUM 8.5 (L) 02/15/2021 0755   GFRNONAA 49 (L) 02/15/2021 0755   GFRNONAA 51 (L) 02/15/2021 0755   GFRNONAA 10 (L) 10/01/2020 1204   GFRAA 11 (L) 10/01/2020 1204    INR    Component Value Date/Time   INR 1.1 10/03/2020 0448     Intake/Output Summary (Last 24 hours) at 02/15/2021 1232 Last data filed at 02/15/2021 0707 Gross per 24 hour  Intake --  Output 400 ml  Net -400 ml     Assessment:  70 y.o. female is here with chronic limb threatening ischemia left lower extremity with 2 areas of heel ulceration and mildly decreased ABIs in the setting of diabetes  Plan: Angiography possible intervention left lower extremity today in [facility].   Tashika Goodin C. Donzetta Matters, MD [facility] Office: [phone] Pager: [phone]  02/15/2021 12:32 PM

## 2021-02-15 NOTE — Progress Notes (Signed)
Inpatient Diabetes Program Recommendations  AACE/ADA: New Consensus Statement on Inpatient Glycemic Control (2015)  Target Ranges:  Prepandial:   less than 140 mg/dL      Peak postprandial:   less than 180 mg/dL (1-2 hours)      Critically ill patients:  140 - 180 mg/dL   Lab Results  Component Value Date   GLUCAP 92 02/15/2021   HGBA1C 4.9 02/12/2021    Review of Glycemic Control Results for NEDDA, GAINS (MRN [id number]) as of 02/15/2021 14:28  Ref. Range 02/14/2021 16:29 02/14/2021 20:53 02/15/2021 06:28 02/15/2021 10:54 02/15/2021 11:27  Glucose-Capillary Latest Ref Range: 70 - 99 mg/dL 101 (H) 93 74 68 (L) 92   Diabetes history: Pre DM Outpatient Diabetes medications: metformin 500 mg bid Current orders for Inpatient glycemic control:  Novolog 0-9 units tid + hs  Inpatient Diabetes Program Recommendations:    Note: pt not requiring insulin while here  Updated A1c 4.9 down from 8.7% on 09/16/20 A1c a little low for pt age and her living by herself. Indicating some hypoglycemia at home.  Attempted to speak with pt at bedside pt difficult to understand and drowsy at this time will see at later time if available. Pt on chronic steroids off and on. Glucose trends look WNL while not on steroids and needs medication while on steroids for glucose control.  Pt does not have a meter at home.   D/C: Blood glucose meter kit order # [id number]  Thanks,  Tama Headings RN, MSN, BC-ADM Inpatient Diabetes Coordinator Team Pager [phone] (8a-5p)

## 2021-02-15 NOTE — Progress Notes (Signed)
Venous duplex lower ext duplex  has been completed. Refer to [facility] under chart review to view preliminary results.   02/15/2021  3:09 PM Adrina Armijo, Bonnye Fava

## 2021-02-15 NOTE — Progress Notes (Signed)
Placed patient on CPAP for HS use per MD order. FFM, previous settings with 0.5l O@ bleed in.

## 2021-02-15 NOTE — Progress Notes (Addendum)
Pt back from cath lab, more alert than prior to procedure, oriented x4. R groin site level 0.  CBG 65, juice given, will recheck as per protocol.  L AC iv infiltrated and removed.  Order placed for IV team new start. Vital signs stable.  Bedrest until 75.

## 2021-02-15 NOTE — Progress Notes (Signed)
PROGRESS NOTE    [name]  WER:[id number] DOB: [birth year] DOA: 02/11/2021 PCP: Leslie Andrea, MD    Brief Narrative:  70 y/o female sent to ED from wound care center for worsening of L foot wound. There was concern for underlying osteomyelitis since per outpatient provider, wound could be probed to the bone. MRI foot did not show any osteomyelitis or abscess. ABI were 0.75, but likely falsely elevated. Since here wound was not healing despite wound care and antibiotics, she as seen by vascular with plans for arteriogram.   Assessment & Plan:   Active Problems:   HLD (hyperlipidemia)   OBSTRUCTIVE SLEEP APNEA   Ocular myasthenia gravis (HCC)   Osteomyelitis (HCC)   Hypomagnesemia   Social problem   Open wound of plantar aspect of foot   DM2 (diabetes mellitus, type 2) (HCC)   Nonhealing wound of left heel and cellulitis -She was sent to the emergency room from the wound care center when it was noted that her wound was progressively getting worse and wound care MD was able to probe wound down to bone -There was also noted to have purulent drainage and foul smell from the wound -Wound cultures performed as an outpatient were positive for Serratia and she was treated with approximately 10 days of ciprofloxacin without improvement -She has been started on intravenous antibiotics for now with vancomycin and cefepime -Repeat wound culture in process -MRI of the foot did not show any evidence of underlying osteomyelitis or abscess -Seen by general surgery and it was not felt that she would tolerate sharp debridement therefore she has been started on Santyl twice daily, will also request WOC input -ABIs found to be 0.75 on the left with monophasic waveforms -vascular surgery following with plans for arteriogram today  Leukocytosis -wbc up to 28.4 today -no obvious source of new infection -her left foot does not seem to be worsening -will check chest xray, UA, urine culture  and blood cultures -check lactate and procalcitonin -she is on IV fluids -she is already on IV antibiotics -no diarrhea -check venous dopplers of LE to r/o DVT -may need to consider CT c/a/p if continues to worsen vs. Re-imaging of foot  Acute encephalopathy -appears to be more lethargic today -she was receiving lasix and may have developed some volume depletion -concurrent rising wbc count is concerning -check abg, ammonia -does not appear to have any focal deficits -blood sugar was low earlier today as she has been npo for procedure, responded to dextrose  Diabetes -She is chronically on metformin -Last A1c from 08/2020 noted to be 8.7 -Repeat A1c 4.9 -previous A1c possibly elevated when she was receiving steroids -Continue on sliding scale insulin for now   Lymphedema -Currently on home dose of Lasix -will hold lasix for now    Obstructive sleep apnea -Continue on CPAP   Ocular myasthenia gravis -Continue on pyridostigmine   GERD -Continue PPI   Hyperlipidemia -Continue statin   Generalized weakness -Physical therapy evaluation with recommendations for home health PT -May need to reevaluate further and her hospital course since it does not appear that she has adequate supervision at home   DVT prophylaxis: heparin injection 5,000 Units Start: 02/11/21 2300 SCDs Start: 02/11/21 2300  Code Status: full code Family Communication: updated daughter on phone on 7/25 Disposition Plan: Status is: Inpatient  Remains inpatient appropriate because:Ongoing diagnostic testing needed not appropriate for outpatient work up, IV treatments appropriate due to intensity of illness or inability to take PO, and  Inpatient level of care appropriate due to severity of illness  Dispo: The patient is from: Home              Anticipated d/c is to: Home              Patient currently is not medically stable to d/c.   Difficult to place patient No         Consultants:   Vascular surgery  Procedures:    Antimicrobials:  Vancomycin 7/22> Cefepime 7/22>    Subjective: She is more somnolent today. She denies any diarrhea, staff also confirms this. She denies abdominal pain, cough or shortness of breath  Objective: Vitals:   02/14/21 2302 02/14/21 2315 02/15/21 0325 02/15/21 0810  BP: (!) 123/57  (!) 113/44 (!) 108/43  Pulse: 76  69 70  Resp: 16  16 17   Temp: (!) 97.3 F (36.3 C)  98.2 F (36.8 C) 98.3 F (36.8 C)  TempSrc: Axillary  Axillary Axillary  SpO2: 97% 98% 96% 97%  Weight:      Height:        Intake/Output Summary (Last 24 hours) at 02/15/2021 1153 Last data filed at 02/15/2021 0707 Gross per 24 hour  Intake 240 ml  Output 400 ml  Net -160 ml   Filed Weights   02/11/21 1909  Weight: 108.4 kg    Examination:  General exam: somnolent Respiratory system: Clear to auscultation. Respiratory effort normal. Cardiovascular system:RRR. No murmurs, rubs, gallops. Gastrointestinal system: Abdomen is nondistended, soft and nontender. No organomegaly or masses felt. Normal bowel sounds heard. Central nervous system: No focal neurological deficits. Extremities: L foot is wrapped in dressing Skin: no significant erythema or warmth of LLE Psychiatry: somnolent, confused      Data Reviewed: I have personally reviewed following labs and imaging studies  CBC: Recent Labs  Lab 02/11/21 1432 02/12/21 0349 02/14/21 0124 02/15/21 0755  WBC 10.9* 10.4 15.1* 28.4*  NEUTROABS 7.7  --   --   --   HGB 10.0* 9.1* 8.6* 8.7*  HCT 29.6* 27.2* 25.4* 25.7*  MCV 93.1 94.1 93.0 92.1  PLT 378 347 302 188   Basic Metabolic Panel: Recent Labs  Lab 02/11/21 1432 02/12/21 0349 02/14/21 0124 02/15/21 0755  NA 134* 135 138 138  K 4.3 4.0 4.0 3.9  CL 104 108 110 110  CO2 22 20* 19* 21*  GLUCOSE 98 93 82 77  BUN 16 15 14  24*  CREATININE 1.02* 0.96 1.18* 1.18*  1.14*  CALCIUM 8.2* 8.1* 8.2* 8.5*  MG 1.3* 1.9  --   --   PHOS  --   --    --  2.7   GFR: Estimated Creatinine Clearance: 53.6 mL/min (A) (by C-G formula based on SCr of 1.18 mg/dL (H)). Liver Function Tests: Recent Labs  Lab 02/12/21 0349 02/15/21 0755  AST 27  --   ALT 17  --   ALKPHOS 74  --   BILITOT 0.6  --   PROT 6.0*  --   ALBUMIN 2.6* 1.9*   No results for input(s): LIPASE, AMYLASE in the last 168 hours. No results for input(s): AMMONIA in the last 168 hours. Coagulation Profile: No results for input(s): INR, PROTIME in the last 168 hours. Cardiac Enzymes: No results for input(s): CKTOTAL, CKMB, CKMBINDEX, TROPONINI in the last 168 hours. BNP (last 3 results) No results for input(s): PROBNP in the last 8760 hours. HbA1C: No results for input(s): HGBA1C in the last 72 hours. CBG: Recent  Labs  Lab 02/14/21 1629 02/14/21 2053 02/15/21 0628 02/15/21 1054 02/15/21 1127  GLUCAP 101* 93 74 68* 92   Lipid Profile: No results for input(s): CHOL, HDL, LDLCALC, TRIG, CHOLHDL, LDLDIRECT in the last 72 hours. Thyroid Function Tests: No results for input(s): TSH, T4TOTAL, FREET4, T3FREE, THYROIDAB in the last 72 hours. Anemia Panel: No results for input(s): VITAMINB12, FOLATE, FERRITIN, TIBC, IRON, RETICCTPCT in the last 72 hours. Sepsis Labs: No results for input(s): PROCALCITON, LATICACIDVEN in the last 168 hours.  Recent Results (from the past 240 hour(s))  Culture, blood (routine x 2)     Status: None (Preliminary result)   Collection Time: 02/11/21  2:37 PM   Specimen: Right Antecubital; Blood  Result Value Ref Range Status   Specimen Description   Final    RIGHT ANTECUBITAL BOTTLES DRAWN AEROBIC AND ANAEROBIC   Special Requests Blood Culture adequate volume  Final   Culture   Final    NO GROWTH 4 DAYS Performed at [facility], [address], [place], [place] [address]    Report Status PENDING  Incomplete  Resp Panel by RT-PCR (Flu A&B, Covid) Nasopharyngeal Swab     Status: None   Collection Time: 02/11/21  3:31 PM   Specimen:  Nasopharyngeal Swab; Nasopharyngeal(NP) swabs in vial transport medium  Result Value Ref Range Status   SARS Coronavirus 2 by RT PCR NEGATIVE NEGATIVE Final    Comment: (NOTE) SARS-CoV-2 target nucleic acids are NOT DETECTED.  The SARS-CoV-2 RNA is generally detectable in upper respiratory specimens during the acute phase of infection. The lowest concentration of SARS-CoV-2 viral copies this assay can detect is 138 copies/mL. A negative result does not preclude SARS-Cov-2 infection and should not be used as the sole basis for treatment or other patient management decisions. A negative result may occur with  improper specimen collection/handling, submission of specimen other than nasopharyngeal swab, presence of viral mutation(s) within the areas targeted by this assay, and inadequate number of viral copies(<138 copies/mL). A negative result must be combined with clinical observations, patient history, and epidemiological information. The expected result is Negative.  Fact Sheet for Patients:  EntrepreneurPulse.com.au  Fact Sheet for Healthcare Providers:  IncredibleEmployment.be  This test is no t yet approved or cleared by the [place] FDA and  has been authorized for detection and/or diagnosis of SARS-CoV-2 by FDA under an Emergency Use Authorization (EUA). This EUA will remain  in effect (meaning this test can be used) for the duration of the COVID-19 declaration under Section 564(b)(1) of the Act, 21 U.S.C.section 360bbb-3(b)(1), unless the authorization is terminated  or revoked sooner.       Influenza A by PCR NEGATIVE NEGATIVE Final   Influenza B by PCR NEGATIVE NEGATIVE Final    Comment: (NOTE) The Xpert Xpress SARS-CoV-2/FLU/RSV plus assay is intended as an aid in the diagnosis of influenza from Nasopharyngeal swab specimens and should not be used as a sole basis for treatment. Nasal washings and aspirates are unacceptable for  Xpert Xpress SARS-CoV-2/FLU/RSV testing.  Fact Sheet for Patients: EntrepreneurPulse.com.au  Fact Sheet for Healthcare Providers: IncredibleEmployment.be  This test is not yet approved or cleared by the [place] FDA and has been authorized for detection and/or diagnosis of SARS-CoV-2 by FDA under an Emergency Use Authorization (EUA). This EUA will remain in effect (meaning this test can be used) for the duration of the COVID-19 declaration under Section 564(b)(1) of the Act, 21 U.S.C. section 360bbb-3(b)(1), unless the authorization is terminated or revoked.  Performed at [facility], [address] [place], [place] [address]   Culture, blood (routine x 2)     Status: None (Preliminary result)   Collection Time: 02/11/21  3:54 PM   Specimen: Left Antecubital; Blood  Result Value Ref Range Status   Specimen Description   Final    LEFT ANTECUBITAL BOTTLES DRAWN AEROBIC AND ANAEROBIC   Special Requests Blood Culture adequate volume  Final   Culture   Final    NO GROWTH 4 DAYS Performed at [facility], [address] [place], [place] [address]    Report Status PENDING  Incomplete  Surgical pcr screen     Status: None   Collection Time: 02/14/21 10:06 PM   Specimen: Nasal Mucosa; Nasal Swab  Result Value Ref Range Status   MRSA, PCR NEGATIVE NEGATIVE Final   Staphylococcus aureus NEGATIVE NEGATIVE Final    Comment: (NOTE) The Xpert SA Assay (FDA approved for NASAL specimens in patients 60 years of age and older), is one component of a comprehensive surveillance program. It is not intended to diagnose infection nor to guide or monitor treatment. Performed at [facility], [place] [address] [place], [place] [address]          Radiology Studies: No results found.      Scheduled Meds:  aspirin EC  81 mg Oral QPM   atorvastatin  40 mg Oral Daily   busPIRone  5 mg Oral BID   collagenase   Topical BID   dextrose        feeding supplement  237 mL Oral BID BM   heparin  5,000 Units Subcutaneous Q8H   insulin aspart  0-5 Units Subcutaneous QHS   insulin aspart  0-9 Units Subcutaneous TID WC   multivitamin with minerals  1 tablet Oral Daily   nutrition supplement (JUVEN)  1 packet Oral BID BM   pantoprazole  40 mg Oral Daily   pyridostigmine  60 mg Oral TID   Continuous Infusions:  ceFEPime (MAXIPIME) IV 2 g (02/15/21 0915)   dextrose 5 % and 0.9% NaCl     vancomycin 1,500 mg (02/14/21 1716)     LOS: 4 days    Time spent: 57mins    Kathie Dike, MD [facility]   If 7PM-7AM, please contact night-coverage [email or web address]  02/15/2021, 11:53 AM

## 2021-02-15 NOTE — Progress Notes (Signed)
Hypoglycemic Event  CBG: 68  Treatment: D50 25 mL (12.5 gm)  Symptoms: None Pt very lethargic, oriented only to self.  Follow-up CBG: Time:1127 CBG Result:92  Possible Reasons for Event: Inadequate meal intake  Comments/MD notified:Dr. Memon notified via phone, received new orders for D5 NS, ABG, CXR.    Renette Butters

## 2021-02-15 NOTE — Progress Notes (Addendum)
[name] ([id number]) Visit Report for 02/11/2021 Arrival Information Details Patient Name: Date of Service: [name] RES H. 02/11/2021 10:00 A M Medical Record Number: [id number] Patient Account Number: [id number] Date of Birth/Sex: Treating RN: [birth year] (70 y.o. [name] Primary Care Eufemio Strahm: Leslie Andrea Other Clinician: Referring Malik Paar: Treating Demetris Meinhardt/Extender: Oretha Ellis in Treatment: 2 Visit Information History Since Last Visit Added or deleted any medications: No Patient Arrived: Wheel Chair Any new allergies or adverse reactions: No Arrival Time: 10:30 Had a fall or experienced change in No Accompanied By: friend activities of daily living that may affect Transfer Assistance: None risk of falls: Patient Identification Verified: Yes Signs or symptoms of abuse/neglect since last visito No Secondary Verification Process Completed: Yes Hospitalized since last visit: No Patient Requires Transmission-Based Precautions: No Implantable device outside of the clinic excluding No Patient Has Alerts: No cellular tissue based products placed in the center since last visit: Has Dressing in Place as Prescribed: Yes Has Compression in Place as Prescribed: Yes Pain Present Now: Yes Electronic Signature(s) Signed: 02/11/2021 6:07:52 PM By: Baruch Gouty RN, BSN Entered By: Baruch Gouty on 02/11/2021 10:37:33 -------------------------------------------------------------------------------- Compression Therapy Details Patient Name: Date of Service: [name], [name] RES H. 02/11/2021 10:00 A M Medical Record Number: [id number] Patient Account Number: [id number] Date of Birth/Sex: Treating RN: 1950/11/05 (70 y.o. [name] Primary Care Klyde Banka: Leslie Andrea Other Clinician: Referring Nayib Remer: Treating Shaneque Merkle/Extender: Oretha Ellis in Treatment: 2 Compression Therapy Performed  for Wound Assessment: Wound #2 Left,Circumferential Lower Leg Performed By: Clinician Levan Hurst, RN Compression Type: Four Layer Post Procedure Diagnosis Same as Pre-procedure Electronic Signature(s) Signed: 02/11/2021 6:31:47 PM By: Deon Pilling Entered By: Deon Pilling on 02/11/2021 11:31:14 -------------------------------------------------------------------------------- Compression Therapy Details Patient Name: Date of Service: [name], [name] RES H. 02/11/2021 10:00 A M Medical Record Number: [id number] Patient Account Number: [id number] Date of Birth/Sex: Treating RN: February 13, 1951 (70 y.o. [name] Primary Care Jyquan Kenley: Leslie Andrea Other Clinician: Referring Dejour Vos: Treating Jakai Risse/Extender: Oretha Ellis in Treatment: 2 Compression Therapy Performed for Wound Assessment: NonWound Condition Lymphedema - Right Leg Performed By: Clinician Levan Hurst, RN Compression Type: Four Layer Post Procedure Diagnosis Same as Pre-procedure Electronic Signature(s) Signed: 02/11/2021 6:31:47 PM By: Deon Pilling Entered By: Deon Pilling on 02/11/2021 11:33:09 -------------------------------------------------------------------------------- Encounter Discharge Information Details Patient Name: Date of Service: [name], [name] RES H. 02/11/2021 10:00 A M Medical Record Number: [id number] Patient Account Number: [id number] Date of Birth/Sex: Treating RN: January 05, 1951 (70 y.o. [name] Primary Care Fabiana Dromgoole: Leslie Andrea Other Clinician: Referring Braydee Shimkus: Treating Tao Satz/Extender: Oretha Ellis in Treatment: 2 Encounter Discharge Information Items Post Procedure Vitals Discharge Condition: Stable Temperature (F): 97.6 Ambulatory Status: Wheelchair Pulse (bpm): 79 Discharge Destination: Home Respiratory Rate (breaths/min): 18 Transportation: Private Auto Blood Pressure (mmHg): 138/75 Accompanied  By: friend Schedule Follow-up Appointment: Yes Clinical Summary of Care: Patient Declined Electronic Signature(s) Signed: 02/15/2021 4:44:07 PM By: Levan Hurst RN, BSN Entered By: Levan Hurst on 02/11/2021 13:30:19 -------------------------------------------------------------------------------- Lower Extremity Assessment Details Patient Name: Date of Service: [name], [name] RES H. 02/11/2021 10:00 A M Medical Record Number: [id number] Patient Account Number: [id number] Date of Birth/Sex: Treating RN: Oct 13, 1950 (70 y.o. [name] Primary Care Oryon Gary: Leslie Andrea Other Clinician: Referring Regan Llorente: Treating Eldean Klatt/Extender: Oretha Ellis in Treatment: 2 Edema Assessment Assessed: [name]: No] [Right: No] Edema: [Left: Yes] [Right: Yes] Calf Left: Right: Point of Measurement: 30 cm From Medial Instep 37.5 cm 33.4  cm Ankle Left: Right: Point of Measurement: 11 cm From Medial Instep 33 cm 29.4 cm Vascular Assessment Pulses: Dorsalis Pedis Palpable: [Left:No] [Right:No] Electronic Signature(s) Signed: 02/11/2021 6:07:52 PM By: Baruch Gouty RN, BSN Entered By: Baruch Gouty on 02/11/2021 10:59:14 -------------------------------------------------------------------------------- Multi Wound Chart Details Patient Name: Date of Service: [name], [name] RES H. 02/11/2021 10:00 A M Medical Record Number: [id number] Patient Account Number: [id number] Date of Birth/Sex: Treating RN: [birth year] (70 y.o. [name], Meta.Reding Primary Care Leaira Fullam: Leslie Andrea Other Clinician: Referring Jhamari Markowicz: Treating Lenon Kuennen/Extender: Oretha Ellis in Treatment: 2 Vital Signs Height(in): 40 Pulse(bpm): 16 Weight(lbs): 42 Blood Pressure(mmHg): 138/75 Body Mass Index(BMI): 40 Temperature(F): 97.6 Respiratory Rate(breaths/min): 18 Photos: [1:No Photos Right, Anterior Lower Leg] [2:No Photos Left, Circumferential  Lower Leg] [3:No Photos Left Calcaneus] Wound Location: [1:Blister] [2:Gradually Appeared] [3:Not Known] Wounding Event: [1:Lymphedema] [2:Lymphedema] [3:Pressure Ulcer] Primary Etiology: [1:Cataracts, Lymphedema, Asthma,] [2:Cataracts, Lymphedema, Asthma,] [3:Cataracts, Lymphedema, Asthma,] Comorbid History: [1:Sleep Apnea, Hypertension, Gout, Osteoarthritis 07/25/2020] [2:Sleep Apnea, Hypertension, Gout, Osteoarthritis 07/25/2020] [3:Sleep Apnea, Hypertension, Gout, Osteoarthritis 12/28/2020] Date Acquired: [1:2] [2:2] [3:2] Weeks of Treatment: [1:Healed - Epithelialized] [2:Open] [3:Open] Wound Status: [1:0x0x0] [2:0.1x0.1x0.1] [3:1.6x1.7x1.8] Measurements L x W x D (cm) [1:0] [2:0.008] [3:2.136] A (cm) : rea [1:0] [2:0.001] [3:3.845] Volume (cm) : [1:100.00%] [2:100.00%] [3:-385.50%] % Reduction in A rea: [1:100.00%] [2:100.00%] [3:-4269.30%] % Reduction in Volume: [3:4] Starting Position 1 (o'clock): [3:5] Ending Position 1 (o'clock): [3:2.3] Maximum Distance 1 (cm): [1:No] [2:No] [3:Yes] Undermining: [1:Full Thickness Without Exposed] [2:Full Thickness Without Exposed] [3:Unstageable/Unclassified] Classification: [1:Support Structures Small] [2:Support Structures Small] [3:Medium] Exudate Amount: [1:Serous] [2:Serous] [3:Serosanguineous] Exudate Type: [1:amber] [2:amber] [3:red, brown] Exudate Color: [1:No] [2:No] [3:Yes] Foul Odor A Cleansing: [1:fter N/A] [2:N/A] [3:No] Odor Anticipated Due to Product Use: [1:N/A] [2:Distinct, outline attached] [3:Distinct, outline attached] Wound Margin: [1:None Present (0%)] [2:None Present (0%)] [3:Small (1-33%)] Granulation Amount: [1:N/A] [2:N/A] [3:Red] Granulation Quality: [1:None Present (0%)] [2:None Present (0%)] [3:Large (67-100%)] Necrotic Amount: [1:Fascia: No] [2:Fascia: No] [3:Fat Layer (Subcutaneous Tissue): Yes] Exposed Structures: [1:Fat Layer (Subcutaneous Tissue): No Tendon: No Muscle: No Joint: No Bone: No Large (67-100%)]  [2:Fat Layer (Subcutaneous Tissue): No Tendon: No Muscle: No Joint: No Bone: No Large (67-100%)] [3:Fascia: No Tendon: No Muscle: No Joint: No Bone: No None] Epithelialization: [1:N/A] [2:N/A] [3:Debridement - Excisional] Debridement: Pre-procedure Verification/Time Out N/A [2:N/A] [3:11:20] Taken: [1:N/A] [2:N/A] [3:Lidocaine 4% Topical Solution] Pain Control: [1:N/A] [2:N/A] [3:Bone, Subcutaneous, Slough] Tissue Debrided: [1:N/A] [2:N/A] [3:Skin/Subcutaneous] Level: [1:N/A] [2:N/A] [3:Tissue/Muscle/Bone 2.72] Debridement A (sq cm): [1:rea N/A] [2:N/A] [3:Curette] Instrument: [1:N/A] [2:N/A] [3:Moderate] Bleeding: [1:N/A] [2:N/A] [3:Pressure] Hemostasis Achieved: [1:N/A] [2:N/A] [3:1] Procedural Pain: [1:N/A] [2:N/A] [3:4] Post Procedural Pain: [1:N/A] [2:N/A] [3:Procedure was not tolerated well] Debridement Treatment Response: [1:N/A] [2:N/A] [3:MD stopped debridement.] Response Comments: [1:N/A] [2:N/A] [3:1.6x1.7x1.8] Post Debridement Measurements L x W x D (cm) [1:N/A] [2:N/A] [3:3.845] Post Debridement Volume: (cm) [1:N/A] [2:N/A] [3:Unstageable/Unclassified] Post Debridement Stage: [1:N/A] [2:Compression Therapy] [3:Debridement] Treatment Notes Electronic Signature(s) Signed: 02/11/2021 5:24:41 PM By: Linton Ham MD Signed: 02/11/2021 6:31:47 PM By: Deon Pilling Entered By: Linton Ham on 02/11/2021 12:29:13 -------------------------------------------------------------------------------- Multi-Disciplinary Care Plan Details Patient Name: Date of Service: Jaeger, [name] RES H. 02/11/2021 10:00 A M Medical Record Number: [id number] Patient Account Number: [id number] Date of Birth/Sex: Treating RN: [birth year] (71 y.o. [name], [name] Primary Care Rolla Servidio: Leslie Andrea Other Clinician: Referring Marielys Trinidad: Treating Kiela Shisler/Extender: Oretha Ellis in Treatment: 2 Multidisciplinary Care Plan reviewed with physician Active  Inactive Electronic Signature(s) Signed: 04/22/2021 3:07:17 PM By: Baruch Gouty RN, BSN  Signed: 04/22/2021 5:44:11 PM By: Deon Pilling RN, BSN Previous Signature: 02/11/2021 6:31:47 PM Version By: Deon Pilling Entered By: Baruch Gouty on 03/18/2021 09:35:44 -------------------------------------------------------------------------------- Non-Wound Condition Assessment Details Patient Name: Date of Service: Heckert, [name] RES H. 02/11/2021 10:00 A M Medical Record Number: [id number] Patient Account Number: [id number] Date of Birth/Sex: Treating RN: [birth year] (70 y.o. [name] Primary Care Chandell Attridge: Leslie Andrea Other Clinician: Referring Dalylah Ramey: Treating Dejon Jungman/Extender: Oretha Ellis in Treatment: 2 Non-Wound Condition: Condition: Lymphedema Location: Leg Side: Right Notes gobblestone appearance. Electronic Signature(s) Signed: 02/11/2021 6:31:47 PM By: Deon Pilling Entered By: Deon Pilling on 02/11/2021 11:32:40 -------------------------------------------------------------------------------- Pain Assessment Details Patient Name: Date of Service: Raneri, [name] RES H. 02/11/2021 10:00 A M Medical Record Number: [id number] Patient Account Number: [id number] Date of Birth/Sex: Treating RN: 09-May-1951 (70 y.o. [name] Primary Care Lakiesha Ralphs: Leslie Andrea Other Clinician: Referring Lafern Brinkley: Treating Naz Denunzio/Extender: Oretha Ellis in Treatment: 2 Active Problems Location of Pain Severity and Description of Pain Patient Has Paino Yes Site Locations Pain Location: Pain in Ulcers With Dressing Change: Yes Duration of the Pain. Constant / Intermittento Constant Rate the pain. Current Pain Level: 7 Worst Pain Level: 9 Least Pain Level: 5 Character of Pain Describe the Pain: Aching Pain Management and Medication Current Pain Management: Medication: Yes Is the Current Pain  Management Adequate: Adequate How does your wound impact your activities of daily livingo Sleep: Yes Bathing: No Appetite: No Relationship With Others: No Bladder Continence: No Emotions: No Bowel Continence: No Hobbies: No Toileting: No Dressing: No Electronic Signature(s) Signed: 02/11/2021 6:07:52 PM By: Baruch Gouty RN, BSN Entered By: Baruch Gouty on 02/11/2021 10:40:10 -------------------------------------------------------------------------------- Patient/Caregiver Education Details Patient Name: Date of Service: Bare, [name] RES H. 7/21/2022andnbsp10:00 A M Medical Record Number: [id number] Patient Account Number: [id number] Date of Birth/Gender: Treating RN: 26-Jan-1951 (70 y.o. [name] Primary Care Physician: Leslie Andrea Other Clinician: Referring Physician: Treating Physician/Extender: Oretha Ellis in Treatment: 2 Education Assessment Education Provided To: Patient Education Topics Provided Wound/Skin Impairment: Handouts: Skin Care Do's and Dont's Methods: Explain/Verbal Responses: Reinforcements needed Electronic Signature(s) Signed: 02/11/2021 6:31:47 PM By: Deon Pilling Entered By: Deon Pilling on 02/11/2021 10:55:45 -------------------------------------------------------------------------------- Wound Assessment Details Patient Name: Date of Service: Badami, [name] RES H. 02/11/2021 10:00 A M Medical Record Number: [id number] Patient Account Number: [id number] Date of Birth/Sex: Treating RN: 1950/10/13 (70 y.o. [name] Primary Care Chiquetta Langner: Leslie Andrea Other Clinician: Referring Lasaro Primm: Treating Reya Aurich/Extender: Oretha Ellis in Treatment: 2 Wound Status Wound Number: 1 Primary Lymphedema Etiology: Wound Location: Right, Anterior Lower Leg Wound Healed - Epithelialized Wounding Event: Blister Status: Date Acquired: 07/25/2020 Comorbid Cataracts,  Lymphedema, Asthma, Sleep Apnea, Hypertension, Weeks Of Treatment: 2 History: Gout, Osteoarthritis Clustered Wound: No Photos Photo Uploaded By: Donavan Burnet on 02/12/2021 08:21:12 Wound Measurements Length: (cm) Width: (cm) Depth: (cm) Area: (cm) Volume: (cm) 0 % Reduction in Area: 100% 0 % Reduction in Volume: 100% 0 Epithelialization: Large (67-100%) 0 Tunneling: No 0 Undermining: No Wound Description Classification: Full Thickness Without Exposed Support Structures Exudate Amount: Small Exudate Type: Serous Exudate Color: amber Foul Odor After Cleansing: No Slough/Fibrino No Wound Bed Granulation Amount: None Present (0%) Exposed Structure Necrotic Amount: None Present (0%) Fascia Exposed: No Fat Layer (Subcutaneous Tissue) Exposed: No Tendon Exposed: No Muscle Exposed: No Joint Exposed: No Bone Exposed: No Electronic Signature(s) Signed: 02/11/2021 6:07:52 PM By: Baruch Gouty RN, BSN Entered By: Baruch Gouty on 02/11/2021 11:00:30 -------------------------------------------------------------------------------- Wound  Assessment Details Patient Name: Date of Service: Flynn, [name] RES H. 02/11/2021 10:00 A M Medical Record Number: [id number] Patient Account Number: [id number] Date of Birth/Sex: Treating RN: 1951-02-23 (70 y.o. [name] Primary Care Brylin Stanislawski: Leslie Andrea Other Clinician: Referring Bracha Frankowski: Treating Maritssa Haughton/Extender: Oretha Ellis in Treatment: 2 Wound Status Wound Number: 2 Primary Lymphedema Etiology: Wound Location: Left, Circumferential Lower Leg Wound Open Wounding Event: Gradually Appeared Status: Date Acquired: 07/25/2020 Comorbid Cataracts, Lymphedema, Asthma, Sleep Apnea, Hypertension, Weeks Of Treatment: 2 History: Gout, Osteoarthritis Clustered Wound: No Photos Photo Uploaded By: Donavan Burnet on 02/12/2021 08:19:54 Wound Measurements Length: (cm) 0.1 Width: (cm)  0.1 Depth: (cm) 0.1 Area: (cm) 0.008 Volume: (cm) 0.001 % Reduction in Area: 100% % Reduction in Volume: 100% Epithelialization: Large (67-100%) Tunneling: No Undermining: No Wound Description Classification: Full Thickness Without Exposed Support Structures Wound Margin: Distinct, outline attached Exudate Amount: Small Exudate Type: Serous Exudate Color: amber Foul Odor After Cleansing: No Slough/Fibrino No Wound Bed Granulation Amount: None Present (0%) Exposed Structure Necrotic Amount: None Present (0%) Fascia Exposed: No Fat Layer (Subcutaneous Tissue) Exposed: No Tendon Exposed: No Muscle Exposed: No Joint Exposed: No Bone Exposed: No Electronic Signature(s) Signed: 02/11/2021 6:07:52 PM By: Baruch Gouty RN, BSN Entered By: Baruch Gouty on 02/11/2021 11:00:51 -------------------------------------------------------------------------------- Wound Assessment Details Patient Name: Date of Service: Veazie, [name] RES H. 02/11/2021 10:00 A M Medical Record Number: [id number] Patient Account Number: [id number] Date of Birth/Sex: Treating RN: 13-Oct-1950 (70 y.o. [name] Primary Care Donovan Persley: Leslie Andrea Other Clinician: Referring Grainger Mccarley: Treating Jidenna Figgs/Extender: Oretha Ellis in Treatment: 2 Wound Status Wound Number: 3 Primary Pressure Ulcer Etiology: Wound Location: Left Calcaneus Wound Open Wounding Event: Not Known Status: Date Acquired: 12/28/2020 Comorbid Cataracts, Lymphedema, Asthma, Sleep Apnea, Hypertension, Weeks Of Treatment: 2 History: Gout, Osteoarthritis Clustered Wound: No Photos Photo Uploaded By: Donavan Burnet on 02/12/2021 08:19:56 Wound Measurements Length: (cm) 1.6 Width: (cm) 1.7 Depth: (cm) 1.8 Area: (cm) 2.136 Volume: (cm) 3.845 % Reduction in Area: -385.5% % Reduction in Volume: -4269.3% Epithelialization: None Tunneling: No Undermining: Yes Starting Position (o'clock):  4 Ending Position (o'clock): 5 Maximum Distance: (cm) 2.3 Wound Description Classification: Unstageable/Unclassified Wound Margin: Distinct, outline attached Exudate Amount: Medium Exudate Type: Serosanguineous Exudate Color: red, brown Foul Odor After Cleansing: Yes Due to Product Use: No Slough/Fibrino Yes Wound Bed Granulation Amount: Small (1-33%) Exposed Structure Granulation Quality: Red Fascia Exposed: No Necrotic Amount: Large (67-100%) Fat Layer (Subcutaneous Tissue) Exposed: Yes Necrotic Quality: Adherent Slough Tendon Exposed: No Muscle Exposed: No Joint Exposed: No Bone Exposed: No Electronic Signature(s) Signed: 02/11/2021 6:07:52 PM By: Baruch Gouty RN, BSN Entered By: Baruch Gouty on 02/11/2021 11:01:32 -------------------------------------------------------------------------------- Purdy Details Patient Name: Date of Service: Alderete, [name] RES H. 02/11/2021 10:00 A M Medical Record Number: [id number] Patient Account Number: [id number] Date of Birth/Sex: Treating RN: August 12, 1950 (70 y.o. Martyn Malay, Linda Primary Care Korde Jeppsen: Leslie Andrea Other Clinician: Referring Mulan Adan: Treating Yuval Rubens/Extender: Oretha Ellis in Treatment: 2 Vital Signs Time Taken: 10:37 Temperature (F): 97.6 Height (in): 65 Pulse (bpm): 79 Source: Stated Respiratory Rate (breaths/min): 18 Weight (lbs): 239 Blood Pressure (mmHg): 138/75 Source: Stated Reference Range: 80 - 120 mg / dl Body Mass Index (BMI): 39.8 Electronic Signature(s) Signed: 02/11/2021 6:07:52 PM By: Baruch Gouty RN, BSN Signed: 02/11/2021 6:07:52 PM By: Baruch Gouty RN, BSN Entered By: Baruch Gouty on 02/11/2021 10:38:09

## 2021-02-15 NOTE — Progress Notes (Signed)
OT Cancellation Note  Patient Details Name: KALEIGHA CHAMBERLIN MRN: [id number] DOB: [birth year]   Cancelled Treatment:    Reason Eval/Treat Not Completed: Fatigue/lethargy limiting ability to participate Pt on CPAP, lethargic and unable to participate in OT eval this AM. Noted plan for L LE angiogram this afternoon. Will follow up for OT eval as appropriate.   Layla Maw 02/15/2021, 10:00 AM

## 2021-02-15 NOTE — Significant Event (Signed)
Rapid Response Event Note   Reason for Call :  Decreased LOC  Initial Focused Assessment:  Pt lying in bed with eyes closed. She will open eyes to verbal stimulation, say "hey or what" and then go back to sleep very quickly. She responds to pain in all extremities but will not follow commands. Pupils 2, equal and reactive. Her breathing is slow. Her lungs are clear, decreased in the bases. Skin warm to touch.    Interventions:  CBG-83 at 2100, 77 now-1/2amp D50 given>CBG up to 82 ABG-7.25/46.5/135/20.1 Narcan 0.4mg  IV x 1>pt mumbling more CT head/chest/abd/pelvis Plan of Care:  Pt more awake after narcan. Await CT results and relay to MD. Continue to monitor pt. Call RRT if further assistance needed.   Event Summary:   MD Notified:  Call [place] End Time:2300  Dillard Essex, RN

## 2021-02-15 NOTE — Op Note (Signed)
Patient name: [name] MRN: [id number] DOB: July 09, 1951 Sex: female  02/15/2021 Pre-operative Diagnosis: chronic limb threatening ischemia left lower extremity Post-operative diagnosis:  Same Surgeon:  Erlene Quan C. Donzetta Matters, MD Procedure Performed: 1.  Ultrasound-guided cannulation right common femoral artery 2.  Aortogram with bilateral lower extremity runoff 3.  Drug-coated balloon angioplasty left tibioperoneal trunk with 4 mm Ranger 4.  Stent of left SFA with 6 x 80 mm Elluvia 5.  Moderate sedation with fentanyl and Versed for 46 minutes 6.  Mynx device closure right common femoral artery   Indications: 70 year old female admitted with ulceration of her left heel.  She is indicated for angiography with possible intervention.  Findings: The aorta is patent as were the bilateral common iliac arteries.  There does appear to be mild flow-limiting stenosis of the left hypogastric artery.  The bilateral renal arteries are patent.  Right lower extremity was incompletely evaluated there appears to be at least 50% stenosis of the mid SFA.  Below the knee on the right peroneal artery appears to be dominant runoff.  The left lower extremity is the site of interest.  There is approximately at least 60% stenosis in the mid SFA after stenting with drug-eluting stent this is reduced to 0%.  At the tibioperoneal trunk there is heavy disease.  I could not traverse into the posterior tibial artery which is diminutive throughout its course appears to frankly occlude distally.  The peroneal artery is the dominant runoff.  I did balloon angioplasty of the tibioperoneal trunk where there was at least 50% stenosis is reduced to less than 20% and there is improved flow in the peroneal artery.  The anterior tibial artery is also patent although there is proximal disease and the flow was disturbed.  Given the patient's noncompliance during the procedure it was difficult to obtain adequate pictures at completion she  did have improved flow with a very strong peroneal signal at the ankle.   Procedure:  The patient was identified in the holding area and taken to room 8.  The patient was then placed supine on the table and prepped and draped in the usual sterile fashion.  A time out was called.  Ultrasound was used to evaluate the right common femoral artery.  This was somewhat diseased particularly posteriorly.  There is anesthetized 1% lidocaine cannulated micropuncture needle followed by wire and sheath.  The wire did traverse easily.  This was done under ultrasound guidance and images saved the permanent record.  A Bentson wire was then placed and we exchanged for a 5 French sheath.  We then placed an Omni catheter up to the level of L1 performed aortogram followed bilateral extremity runoff.  We then crossed the bifurcation performed left lower extremity angiography.  With the above findings we plan to intervene.  We placed initially attempts at 6 French sheath was then placed a 7 [place] dilator.  We ultimately exchanged for a Rosen wire and placed a long 6 French sheath in the left SFA patient was fully heparinized.  We crossed first the SFA under fluoroscopic guidance with V 18 wire and quick cross catheter.  At the TP trunk bifurcation and posterior tibial artery we attempted to use V 18 and quick cross select catheter but could not direct into the posterior tibial artery.  Given the patient's noncompliance I elected not to cannulate the posterior tibial artery retrograde.  We then primarily ballooned dilated the tibioperoneal trunk with 3 mm balloon.  This was then  dilated to 4 mm drug-coated balloon angioplasty at nominal pressure for 4 minutes.  Completion demonstrated no residual dissection stenosis was less than 20%.  Satisfied with this we turned our attention towards the SFA.  This was primarily stented with a 6 mm drug-eluting stent and postdilated with 5 mm balloon.  Completion of demonstrated minimal residual  stenosis.  We then exchanged for a short 6 French sheath perform retrograde angiography.  A minx device was deployed.  She tolerated procedure without any complication.  Contrast: 150 cc   Rony Ratz C. Donzetta Matters, MD [facility] [place] Office: [phone] Pager: [phone]

## 2021-02-15 NOTE — Progress Notes (Signed)
PT Cancellation Note  Patient Details Name: [name] MRN: [id number] DOB: [birth year]   Cancelled Treatment:    Reason Eval/Treat Not Completed: (P) Patient at procedure or test/unavailable (pt off unit for procedure (Cath Lab).) Will continue efforts per PT POC as schedule permits.   Sir Mallis [name] 02/15/2021, 3:12 PM

## 2021-02-16 ENCOUNTER — Encounter (HOSPITAL_COMMUNITY): Payer: Self-pay | Admitting: Vascular Surgery

## 2021-02-16 ENCOUNTER — Inpatient Hospital Stay (HOSPITAL_COMMUNITY): Payer: Medicare Other

## 2021-02-16 DIAGNOSIS — G4733 Obstructive sleep apnea (adult) (pediatric): Secondary | ICD-10-CM | POA: Diagnosis not present

## 2021-02-16 DIAGNOSIS — E1159 Type 2 diabetes mellitus with other circulatory complications: Secondary | ICD-10-CM | POA: Diagnosis not present

## 2021-02-16 DIAGNOSIS — D72829 Elevated white blood cell count, unspecified: Secondary | ICD-10-CM | POA: Diagnosis not present

## 2021-02-16 DIAGNOSIS — G7000 Myasthenia gravis without (acute) exacerbation: Secondary | ICD-10-CM | POA: Diagnosis not present

## 2021-02-16 LAB — URINALYSIS, COMPLETE (UACMP) WITH MICROSCOPIC
Bilirubin Urine: NEGATIVE
Glucose, UA: NEGATIVE mg/dL
Ketones, ur: NEGATIVE mg/dL
Nitrite: NEGATIVE
Protein, ur: NEGATIVE mg/dL
Specific Gravity, Urine: 1.043 — ABNORMAL HIGH (ref 1.005–1.030)
pH: 5 (ref 5.0–8.0)

## 2021-02-16 LAB — CULTURE, BLOOD (ROUTINE X 2)
Culture: NO GROWTH
Culture: NO GROWTH
Special Requests: ADEQUATE
Special Requests: ADEQUATE

## 2021-02-16 LAB — CBC
HCT: 24 % — ABNORMAL LOW (ref 36.0–46.0)
Hemoglobin: 7.9 g/dL — ABNORMAL LOW (ref 12.0–15.0)
MCH: 31.1 pg (ref 26.0–34.0)
MCHC: 32.9 g/dL (ref 30.0–36.0)
MCV: 94.5 fL (ref 80.0–100.0)
Platelets: 261 10*3/uL (ref 150–400)
RBC: 2.54 MIL/uL — ABNORMAL LOW (ref 3.87–5.11)
RDW: 17.5 % — ABNORMAL HIGH (ref 11.5–15.5)
WBC: 19 10*3/uL — ABNORMAL HIGH (ref 4.0–10.5)
nRBC: 0.3 % — ABNORMAL HIGH (ref 0.0–0.2)

## 2021-02-16 LAB — BASIC METABOLIC PANEL
Anion gap: 6 (ref 5–15)
BUN: 30 mg/dL — ABNORMAL HIGH (ref 8–23)
CO2: 18 mmol/L — ABNORMAL LOW (ref 22–32)
Calcium: 8.3 mg/dL — ABNORMAL LOW (ref 8.9–10.3)
Chloride: 114 mmol/L — ABNORMAL HIGH (ref 98–111)
Creatinine, Ser: 1.39 mg/dL — ABNORMAL HIGH (ref 0.44–1.00)
GFR, Estimated: 41 mL/min — ABNORMAL LOW (ref >60)
Glucose, Bld: 95 mg/dL (ref 70–99)
Potassium: 3.9 mmol/L (ref 3.5–5.1)
Sodium: 138 mmol/L (ref 135–145)

## 2021-02-16 LAB — TSH: TSH: 1.618 u[IU]/mL (ref 0.350–4.500)

## 2021-02-16 LAB — VANCOMYCIN, PEAK: Vancomycin Pk: 42 ug/mL — ABNORMAL HIGH (ref 30–40)

## 2021-02-16 LAB — GLUCOSE, CAPILLARY
Glucose-Capillary: 69 mg/dL — ABNORMAL LOW (ref 70–99)
Glucose-Capillary: 89 mg/dL (ref 70–99)
Glucose-Capillary: 113 mg/dL — ABNORMAL HIGH (ref 70–99)
Glucose-Capillary: 80 mg/dL (ref 70–99)

## 2021-02-16 LAB — CORTISOL: Cortisol, Plasma: 22.6 ug/dL

## 2021-02-16 LAB — VITAMIN B12: Vitamin B-12: 506 pg/mL (ref 180–914)

## 2021-02-16 LAB — PROCALCITONIN: Procalcitonin: 0.48 ng/mL

## 2021-02-16 LAB — VANCOMYCIN, RANDOM: Vancomycin Rm: 43

## 2021-02-16 LAB — AMMONIA: Ammonia: 54 umol/L — ABNORMAL HIGH (ref 9–35)

## 2021-02-16 MED ORDER — SODIUM CHLORIDE 0.9 % IV SOLN
2.0000 g | INTRAVENOUS | Status: DC
  Administered 2021-02-16 – 2021-02-22 (×7): 2 g via INTRAVENOUS
  Filled 2021-02-16: qty 20
  Filled 2021-02-16: qty 2
  Filled 2021-02-16: qty 20
  Filled 2021-02-16: qty 2
  Filled 2021-02-16 (×2): qty 20
  Filled 2021-02-16 (×2): qty 2
  Filled 2021-02-16 (×2): qty 20

## 2021-02-16 MED ORDER — SODIUM CHLORIDE 0.9 % IV SOLN: INTRAVENOUS | Status: DC

## 2021-02-16 MED ORDER — LACTULOSE 10 GM/15ML PO SOLN
20.0000 g | Freq: Three times a day (TID) | ORAL | Status: DC
  Administered 2021-02-16 – 2021-02-19 (×6): 20 g via ORAL
  Filled 2021-02-16 (×6): qty 30

## 2021-02-16 MED ORDER — DEXTROSE 50 % IV SOLN
INTRAVENOUS | Status: AC
  Administered 2021-02-16: 50 mL
  Filled 2021-02-16: qty 50

## 2021-02-16 MED ORDER — SODIUM CHLORIDE 0.9% FLUSH
10.0000 mL | Freq: Two times a day (BID) | INTRAVENOUS | Status: DC
  Administered 2021-02-16 – 2021-02-21 (×9): 10 mL
  Administered 2021-02-21 – 2021-02-22 (×2): 20 mL
  Administered 2021-02-22 – 2021-02-23 (×2): 10 mL
  Administered 2021-02-23: 20 mL
  Administered 2021-02-24: 10 mL

## 2021-02-16 MED ORDER — CHLORHEXIDINE GLUCONATE CLOTH 2 % EX PADS
6.0000 | MEDICATED_PAD | Freq: Every day | CUTANEOUS | Status: DC
  Administered 2021-02-16 – 2021-02-24 (×10): 6 via TOPICAL

## 2021-02-16 MED ORDER — SODIUM CHLORIDE 0.9% FLUSH: 10.0000 mL | INTRAVENOUS | Status: DC | PRN

## 2021-02-16 NOTE — Progress Notes (Addendum)
Progress Note    02/16/2021 7:28 AM 1 Day Post-Op  Subjective:  Delirious>>responds "ok,ok" to all questions. (My first exam of patient)  RR called at 2126 hours. CBG = 77. CT of head/chest/abd/pelvis. CPAP placed.  Vitals:   02/16/21 0245 02/16/21 0353  BP:  (!) 105/44  Pulse: 85 91  Resp: (!) 22 16  Temp:  99 F (37.2 C)  SpO2: 98% 96%    Physical Exam: General appearance: Awake, alert in no apparent distress Cardiac: Heart rate and rhythm are regular Respirations: Nonlabored Incisions: Right groin puncture site is soft without bleeding or hematoma Extremities: Both feet are warm with brisk bilateral dorsalis pedis Doppler signals  Left heel dressing is dry and intact  CT head = nothing acute CT abd/pelvis = acute pancreatitis; no mention of RP bleed  CBC    Component Value Date/Time   WBC 27.0 (H) 02/15/2021 1730   RBC 2.78 (L) 02/15/2021 1730   HGB 8.9 (L) 02/15/2021 1730   HCT 25.3 (L) 02/15/2021 1730   PLT 323 02/15/2021 1730   MCV 91.0 02/15/2021 1730   MCH 32.0 02/15/2021 1730   MCHC 35.2 02/15/2021 1730   RDW 17.3 (H) 02/15/2021 1730   LYMPHSABS 0.9 02/15/2021 1730   MONOABS 1.7 (H) 02/15/2021 1730   EOSABS 0.4 02/15/2021 1730   BASOSABS 0.1 02/15/2021 1730    BMET    Component Value Date/Time   NA 138 02/15/2021 0755   K 3.9 02/15/2021 0755   CL 110 02/15/2021 0755   CO2 21 (L) 02/15/2021 0755   GLUCOSE 77 02/15/2021 0755   BUN 24 (H) 02/15/2021 0755   CREATININE 1.18 (H) 02/15/2021 0755   CREATININE 1.14 (H) 02/15/2021 0755   CREATININE 4.23 (H) 10/01/2020 1204   CALCIUM 8.5 (L) 02/15/2021 0755   GFRNONAA 49 (L) 02/15/2021 0755   GFRNONAA 51 (L) 02/15/2021 0755   GFRNONAA 10 (L) 10/01/2020 1204   GFRAA 11 (L) 10/01/2020 1204     Intake/Output Summary (Last 24 hours) at 02/16/2021 8416 Last data filed at 02/16/2021 6063 Gross per 24 hour  Intake --  Output 450 ml  Net -450 ml    HOSPITAL MEDICATIONS Scheduled Meds:   atorvastatin  40 mg Oral Daily   busPIRone  5 mg Oral BID   collagenase   Topical BID   feeding supplement  237 mL Oral BID BM   multivitamin with minerals  1 tablet Oral Daily   nutrition supplement (JUVEN)  1 packet Oral BID BM   pantoprazole  40 mg Oral Daily   pyridostigmine  60 mg Oral TID   sodium chloride flush  3 mL Intravenous Q12H   Continuous Infusions:  sodium chloride     ceFEPime (MAXIPIME) IV 2 g (02/15/21 2311)   dextrose 5 % and 0.9% NaCl 100 mL/hr at 02/16/21 0501   PRN Meds:.sodium chloride, acetaminophen **OR** acetaminophen, acetaminophen, albuterol, hydrALAZINE, HYDROcodone-acetaminophen, labetalol, morphine injection, naloxone, ondansetron **OR** ondansetron (ZOFRAN) IV, polyethylene glycol, sodium chloride flush, traZODone  Assessment and Plan: left heel wounds chronic limb threatening ischemia left lower extremity s/p drug-coated balloon angioplasty left tibioperoneal trunk with 4 mm Ranger and stent of left SFA with 6 x 80 mm Elluvia  Hgb stable. Presumably will need to start Plavix. On statin.   Risa Grill, PA-C [facility] [phone] 02/16/2021  7:28 AM   VASCULAR STAFF ADDENDUM: I have independently interviewed and examined the patient. I agree with the above.  Delirious after angiogram yesterday.  Good  technical result from angiogram. No evidence of significant complication from intervention. Small hematoma in R groin noted. Acute pancreatitis on CT from today. Continue ASA 81mg  PO QD indefinitely. Start Plavix 75mg  PO QD as soon as medicine feels it is safe (e.g. after current workup). Continue statin therapy indefinitely.  Recommend local wound care to the heel only. No need for operative debridement. Orders written.  Will follow along.   Yevonne Aline. Stanford Breed, MD [facility] Phone Number: [phone] 02/16/2021 12:53 PM

## 2021-02-16 NOTE — Progress Notes (Signed)
Per RN patient is unavailable for EEG du to pci line placement and MRI. We will reattempt when she returns from MRI.

## 2021-02-16 NOTE — Progress Notes (Signed)
Decreased level of consciousness,Temp is low,on bear hugger,responds to command and go back to sleep quickly,V/S monitored  and blood sugar checkedCharge Nurse /Rapid Response RN notified.Dr. Pricilla Larsson hospitalist on call)notified with orders made.Rapid response RN in bedside.Will closely monitor the patient.

## 2021-02-16 NOTE — Progress Notes (Signed)
EEG complete - results pending 

## 2021-02-16 NOTE — Progress Notes (Signed)
Pt placed on CPAP at this time.  CPAP placement was delayed due to CT trip and rapid response assessment.

## 2021-02-16 NOTE — Progress Notes (Signed)
PROGRESS NOTE    [name]  ATF:[id number] DOB: 05-17-1951 DOA: 02/11/2021 PCP: Leslie Andrea, MD    Chief Complaint  Patient presents with   Leg Pain    Brief Narrative:  70 y/o female sent to ED from wound care center for worsening of L foot wound. There was concern for underlying osteomyelitis since per outpatient provider, wound could be probed to the bone. MRI foot did not show any osteomyelitis or abscess. ABI were 0.75, but likely falsely elevated. Since here wound was not healing despite wound care and antibiotics, she as seen by vascular and underwent aortogram with bilateral lower extremity runoff, s/p balloon angioplasty left tibioperoneal trunk and placement of a stent of left SFA. Overnight patient became encephalopathic, confused.  Initial CT of the head is negative for acute stroke.  MRI of the brain without contrast ordered for further evaluation. Assessment & Plan:   Active Problems:   HLD (hyperlipidemia)   OBSTRUCTIVE SLEEP APNEA   Ocular myasthenia gravis (HCC)   Hypomagnesemia   Social problem   Open wound of plantar aspect of foot   DM2 (diabetes mellitus, type 2) (HCC)   Nonhealing left heel wound and cellulitis On admission patient was found to have purulent discharge and foul-smelling from the left heel wound. She recently completed treatment with ciprofloxacin for Serratia isolated from the wound cultures. Repeat wound cultures are pending MRI of the foot does not show any osteomyelitis or abscess Vascular surgery consulted and underwent aortogram, balloon angioplasty and left SFA stent.     Acute encephalopathy of unclear etiology/ metabolic encephalopathy.  Differentials include lethargy from moderate sedation vs hepatic encephalopathy from elevated ammonia levels. Get EEG , TSH, vitamin b12 levels.  UA is slightly abnormal , but pt is already on IV cefepime.  Ammonia levels elevated, added lactulose.  Able to move all extremities.    Type 2 diabetes mellitus A1c is 4.9 Continue with sliding scale insulin.    Lymphedema   Mild AKI With metabolic acidosis:  Suspect from dehydration, hypotension.  Gently hydrate and repeat renal parameters in am.    Obstructive sleep apnea Continue with CPAP   Ocular myasthenia gravis Patient on pyridostigmine  Hyperlipidemia Continue with statin   Deconditioning and generalized weakness:  Therapy evaluations recommending SNF.    DVT prophylaxis: Heparin Code Status: Full code Family Communication: (None at bedside Disposition:   Status is: Inpatient  Remains inpatient appropriate because:Ongoing diagnostic testing needed not appropriate for outpatient work up, Unsafe d/c plan, and IV treatments appropriate due to intensity of illness or inability to take PO  Dispo: The patient is from: Home              Anticipated d/c is to: SNF              Patient currently is not medically stable to d/c.   Difficult to place patient No       Consultants:  Vascular surgery  Procedures: MRI of the brain Aortogram Antimicrobials:  Antibiotics Given (last 72 hours)     Date/Time Action Medication Dose Rate   02/13/21 1635 New Bag/Given   ceFEPIme (MAXIPIME) 2 g in sodium chloride 0.9 % 100 mL IVPB 2 g 200 mL/hr   02/13/21 1716 New Bag/Given   vancomycin (VANCOREADY) IVPB 1500 mg/300 mL 1,500 mg 150 mL/hr   02/13/21 2308 New Bag/Given   ceFEPIme (MAXIPIME) 2 g in sodium chloride 0.9 % 100 mL IVPB 2 g 200 mL/hr   02/14/21 0813  New Bag/Given   ceFEPIme (MAXIPIME) 2 g in sodium chloride 0.9 % 100 mL IVPB 2 g 200 mL/hr   02/14/21 1716 New Bag/Given   vancomycin (VANCOREADY) IVPB 1500 mg/300 mL 1,500 mg 150 mL/hr   02/14/21 2136 New Bag/Given   ceFEPIme (MAXIPIME) 2 g in sodium chloride 0.9 % 100 mL IVPB 2 g 200 mL/hr   02/15/21 0915 New Bag/Given   ceFEPIme (MAXIPIME) 2 g in sodium chloride 0.9 % 100 mL IVPB 2 g 200 mL/hr   02/15/21 2311 New Bag/Given   ceFEPIme  (MAXIPIME) 2 g in sodium chloride 0.9 % 100 mL IVPB 2 g 200 mL/hr   02/16/21 0840 New Bag/Given   ceFEPIme (MAXIPIME) 2 g in sodium chloride 0.9 % 100 mL IVPB 2 g 200 mL/hr         Subjective: Does not appear to be in distress.   Objective: Vitals:   02/16/21 0245 02/16/21 0353 02/16/21 0800 02/16/21 1121  BP:  (!) 105/44 (!) 116/49   Pulse: 85 91 93   Resp: (!) 22 16 16    Temp:  99 F (37.2 C) 97.9 F (36.6 C)   TempSrc:  Axillary Oral   SpO2: 98% 96% 100% 100%  Weight:  105.9 kg    Height:        Intake/Output Summary (Last 24 hours) at 02/16/2021 1124 Last data filed at 02/16/2021 7253 Gross per 24 hour  Intake --  Output 450 ml  Net -450 ml   Filed Weights   02/11/21 1909 02/16/21 0353  Weight: 108.4 kg 105.9 kg    Examination:  General exam: Appears calm and comfortable  Respiratory system: Clear to auscultation. Respiratory effort normal. Cardiovascular system: S1 & S2 heard, RRR. No JVD, positive for  pedal edema. Gastrointestinal system: Abdomen is nondistended, soft and nontender.  Normal bowel sounds heard. Central nervous system: lethargy. Following simple commands. Able to move all extremities.  Extremities: leg edema. And left heel wound.  Skin: No rashes, lesions or ulcers Psychiatry:  cannot be assessed.     Data Reviewed: I have personally reviewed following labs and imaging studies  CBC: Recent Labs  Lab 02/11/21 1432 02/12/21 0349 02/14/21 0124 02/15/21 0755 02/15/21 1730 02/16/21 0959  WBC 10.9* 10.4 15.1* 28.4* 27.0* 19.0*  NEUTROABS 7.7  --   --   --  23.7*  --   HGB 10.0* 9.1* 8.6* 8.7* 8.9* 7.9*  HCT 29.6* 27.2* 25.4* 25.7* 25.3* 24.0*  MCV 93.1 94.1 93.0 92.1 91.0 94.5  PLT 378 347 302 312 323 664    Basic Metabolic Panel: Recent Labs  Lab 02/11/21 1432 02/12/21 0349 02/14/21 0124 02/15/21 0755 02/16/21 0959  NA 134* 135 138 138 138  K 4.3 4.0 4.0 3.9 3.9  CL 104 108 110 110 114*  CO2 22 20* 19* 21* 18*  GLUCOSE  98 93 82 77 95  BUN 16 15 14  24* 30*  CREATININE 1.02* 0.96 1.18* 1.18*  1.14* 1.39*  CALCIUM 8.2* 8.1* 8.2* 8.5* 8.3*  MG 1.3* 1.9  --   --   --   PHOS  --   --   --  2.7  --     GFR: Estimated Creatinine Clearance: 44.9 mL/min (A) (by C-G formula based on SCr of 1.39 mg/dL (H)).  Liver Function Tests: Recent Labs  Lab 02/12/21 0349 02/15/21 0755  AST 27  --   ALT 17  --   ALKPHOS 74  --   BILITOT 0.6  --  PROT 6.0*  --   ALBUMIN 2.6* 1.9*    CBG: Recent Labs  Lab 02/15/21 2100 02/15/21 2157 02/15/21 2225 02/16/21 0619 02/16/21 0703  GLUCAP 83 77 82 69* 89     Recent Results (from the past 240 hour(s))  Culture, blood (routine x 2)     Status: None   Collection Time: 02/11/21  2:37 PM   Specimen: Right Antecubital; Blood  Result Value Ref Range Status   Specimen Description   Final    RIGHT ANTECUBITAL BOTTLES DRAWN AEROBIC AND ANAEROBIC   Special Requests Blood Culture adequate volume  Final   Culture   Final    NO GROWTH 5 DAYS Performed at [facility], [address], [place], [place] [address]    Report Status 02/16/2021 FINAL  Final  Resp Panel by RT-PCR (Flu A&B, Covid) Nasopharyngeal Swab     Status: None   Collection Time: 02/11/21  3:31 PM   Specimen: Nasopharyngeal Swab; Nasopharyngeal(NP) swabs in vial transport medium  Result Value Ref Range Status   SARS Coronavirus 2 by RT PCR NEGATIVE NEGATIVE Final    Comment: (NOTE) SARS-CoV-2 target nucleic acids are NOT DETECTED.  The SARS-CoV-2 RNA is generally detectable in upper respiratory specimens during the acute phase of infection. The lowest concentration of SARS-CoV-2 viral copies this assay can detect is 138 copies/mL. A negative result does not preclude SARS-Cov-2 infection and should not be used as the sole basis for treatment or other patient management decisions. A negative result may occur with  improper specimen collection/handling, submission of specimen other than nasopharyngeal  swab, presence of viral mutation(s) within the areas targeted by this assay, and inadequate number of viral copies(<138 copies/mL). A negative result must be combined with clinical observations, patient history, and epidemiological information. The expected result is Negative.  Fact Sheet for Patients:  EntrepreneurPulse.com.au  Fact Sheet for Healthcare Providers:  IncredibleEmployment.be  This test is no t yet approved or cleared by the [place] FDA and  has been authorized for detection and/or diagnosis of SARS-CoV-2 by FDA under an Emergency Use Authorization (EUA). This EUA will remain  in effect (meaning this test can be used) for the duration of the COVID-19 declaration under Section 564(b)(1) of the Act, 21 U.S.C.section 360bbb-3(b)(1), unless the authorization is terminated  or revoked sooner.       Influenza A by PCR NEGATIVE NEGATIVE Final   Influenza B by PCR NEGATIVE NEGATIVE Final    Comment: (NOTE) The Xpert Xpress SARS-CoV-2/FLU/RSV plus assay is intended as an aid in the diagnosis of influenza from Nasopharyngeal swab specimens and should not be used as a sole basis for treatment. Nasal washings and aspirates are unacceptable for Xpert Xpress SARS-CoV-2/FLU/RSV testing.  Fact Sheet for Patients: EntrepreneurPulse.com.au  Fact Sheet for Healthcare Providers: IncredibleEmployment.be  This test is not yet approved or cleared by the [place] FDA and has been authorized for detection and/or diagnosis of SARS-CoV-2 by FDA under an Emergency Use Authorization (EUA). This EUA will remain in effect (meaning this test can be used) for the duration of the COVID-19 declaration under Section 564(b)(1) of the Act, 21 U.S.C. section 360bbb-3(b)(1), unless the authorization is terminated or revoked.  Performed at [facility], [address] [place], [place] [address]   Culture, blood  (routine x 2)     Status: None   Collection Time: 02/11/21  3:54 PM   Specimen: Left Antecubital; Blood  Result Value Ref Range Status   Specimen Description  Final    LEFT ANTECUBITAL BOTTLES DRAWN AEROBIC AND ANAEROBIC   Special Requests Blood Culture adequate volume  Final   Culture   Final    NO GROWTH 5 DAYS Performed at [facility], [address] [place], [place] [address]    Report Status 02/16/2021 FINAL  Final  Surgical pcr screen     Status: None   Collection Time: 02/14/21 10:06 PM   Specimen: Nasal Mucosa; Nasal Swab  Result Value Ref Range Status   MRSA, PCR NEGATIVE NEGATIVE Final   Staphylococcus aureus NEGATIVE NEGATIVE Final    Comment: (NOTE) The Xpert SA Assay (FDA approved for NASAL specimens in patients 86 years of age and older), is one component of a comprehensive surveillance program. It is not intended to diagnose infection nor to guide or monitor treatment. Performed at [facility], [place] [address] [place], [place] [address]   Culture, blood (routine x 2)     Status: None (Preliminary result)   Collection Time: 02/15/21 12:49 PM   Specimen: BLOOD  Result Value Ref Range Status   Specimen Description BLOOD RIGHT ANTECUBITAL  Final   Special Requests   Final    BOTTLES DRAWN AEROBIC ONLY Blood Culture adequate volume   Culture   Final    NO GROWTH < 24 HOURS Performed at [facility], [place] [address] [place], [place] [address]    Report Status PENDING  Incomplete  Culture, blood (routine x 2)     Status: None (Preliminary result)   Collection Time: 02/15/21 12:58 PM   Specimen: BLOOD LEFT HAND  Result Value Ref Range Status   Specimen Description BLOOD LEFT HAND  Final   Special Requests   Final    BOTTLES DRAWN AEROBIC ONLY Blood Culture results may not be optimal due to an inadequate volume of blood received in culture bottles   Culture   Final    NO GROWTH < 24 HOURS Performed at [facility], [place] [address],  [place], [place] [address]    Report Status PENDING  Incomplete         Radiology Studies: CT HEAD WO CONTRAST  Result Date: 02/15/2021 CLINICAL DATA:  Mental status change EXAM: CT HEAD WITHOUT CONTRAST TECHNIQUE: Contiguous axial images were obtained from the base of the skull through the vertex without intravenous contrast. COMPARISON:  MRI 02/24/2017 FINDINGS: Brain: No evidence of acute infarction, hemorrhage, hydrocephalus, extra-axial collection, visible mass lesion or mass effect. Symmetric prominence of the ventricles, cisterns and sulci compatible with parenchymal volume loss. Patchy areas of white matter hypoattenuation are most compatible with chronic microvascular angiopathy. Benign-appearing coarse dural calcifications. No conspicuous dural thickening. Partially empty appearance of the sella. Remaining midline intracranial structures are unremarkable with normally positioned cerebellar tonsils. Basal cisterns are patent. Vascular: Atherosclerotic calcification of the carotid siphons and left intradural vertebral artery. No hyperdense vessel. Skull: Hyperostotic changes of the skull, typically benign incidental and often senescent finding. No calvarial fracture or suspicious osseous lesion. No scalp swelling or hematoma. Sinuses/Orbits: Paranasal sinuses and mastoid air cells are predominantly clear. Orbital structures are unremarkable aside from prior lens extractions. Other: Numerous chronically absent dentition and carious lesions with few visible periapical lucencies. IMPRESSION: No acute intracranial abnormality. Background of parenchymal volume loss, microvascular angiopathy and intracranial atherosclerosis. Partially empty appearance of the sella, nonspecific finding particularly in elderly patients. Numerous chronically absent dentition and multiple carious lesions with periapical lucency. Correlate with outpatient dental exam. Electronically Signed  By: Lovena Le M.D.   On: 02/15/2021  23:30   PERIPHERAL VASCULAR CATHETERIZATION  Result Date: 02/15/2021 Images from the original result were not included. Patient name: [name] MRN: [id number] DOB: 1951/06/06 Sex: female 02/15/2021 Pre-operative Diagnosis: chronic limb threatening ischemia left lower extremity Post-operative diagnosis:  Same Surgeon:  Erlene Quan C. Donzetta Matters, MD Procedure Performed: 1.  Ultrasound-guided cannulation right common femoral artery 2.  Aortogram with bilateral lower extremity runoff 3.  Drug-coated balloon angioplasty left tibioperoneal trunk with 4 mm Ranger 4.  Stent of left SFA with 6 x 80 mm Elluvia 5.  Moderate sedation with fentanyl and Versed for 46 minutes 6.  Mynx device closure right common femoral artery Indications: 70 year old female admitted with ulceration of her left heel.  She is indicated for angiography with possible intervention. Findings: The aorta is patent as were the bilateral common iliac arteries.  There does appear to be mild flow-limiting stenosis of the left hypogastric artery.  The bilateral renal arteries are patent.  Right lower extremity was incompletely evaluated there appears to be at least 50% stenosis of the mid SFA.  Below the knee on the right peroneal artery appears to be dominant runoff.  The left lower extremity is the site of interest.  There is approximately at least 60% stenosis in the mid SFA after stenting with drug-eluting stent this is reduced to 0%.  At the tibioperoneal trunk there is heavy disease.  I could not traverse into the posterior tibial artery which is diminutive throughout its course appears to frankly occlude distally.  The peroneal artery is the dominant runoff.  I did balloon angioplasty of the tibioperoneal trunk where there was at least 50% stenosis is reduced to less than 20% and there is improved flow in the peroneal artery.  The anterior tibial artery is also patent although there is proximal disease and the flow was disturbed.  Given the patient's  noncompliance during the procedure it was difficult to obtain adequate pictures at completion she did have improved flow with a very strong peroneal signal at the ankle.  Procedure:  The patient was identified in the holding area and taken to room 8.  The patient was then placed supine on the table and prepped and draped in the usual sterile fashion.  A time out was called.  Ultrasound was used to evaluate the right common femoral artery.  This was somewhat diseased particularly posteriorly.  There is anesthetized 1% lidocaine cannulated micropuncture needle followed by wire and sheath.  The wire did traverse easily.  This was done under ultrasound guidance and images saved the permanent record.  A Bentson wire was then placed and we exchanged for a 5 French sheath.  We then placed an Omni catheter up to the level of L1 performed aortogram followed bilateral extremity runoff.  We then crossed the bifurcation performed left lower extremity angiography.  With the above findings we plan to intervene.  We placed initially attempts at 6 French sheath was then placed a 7 [place] dilator.  We ultimately exchanged for a Rosen wire and placed a long 6 French sheath in the left SFA patient was fully heparinized.  We crossed first the SFA under fluoroscopic guidance with V 18 wire and quick cross catheter.  At the TP trunk bifurcation and posterior tibial artery we attempted to use V 18 and quick cross select catheter but could not direct into the posterior tibial artery.  Given the patient's noncompliance I elected not to cannulate the  posterior tibial artery retrograde.  We then primarily ballooned dilated the tibioperoneal trunk with 3 mm balloon.  This was then dilated to 4 mm drug-coated balloon angioplasty at nominal pressure for 4 minutes.  Completion demonstrated no residual dissection stenosis was less than 20%.  Satisfied with this we turned our attention towards the SFA.  This was primarily stented with a 6 mm  drug-eluting stent and postdilated with 5 mm balloon.  Completion of demonstrated minimal residual stenosis.  We then exchanged for a short 6 French sheath perform retrograde angiography.  A minx device was deployed.  She tolerated procedure without any complication. Contrast: 150 cc Brandon C. Donzetta Matters, MD [facility] Office: [phone] Pager: [phone]   DG CHEST PORT 1 VIEW  Result Date: 02/15/2021 CLINICAL DATA:  Preoperative EXAM: PORTABLE CHEST 1 VIEW COMPARISON:  11/21/2020 Cardiomegaly. Unchanged elevation of the left hemidiaphragm. No acute appearing airspace opacity. The visualized skeletal structures are unremarkable. IMPRESSION: No acute abnormality of the lungs in frontal projection. Electronically Signed   By: Eddie Candle M.D.   On: 02/15/2021 15:47   VAS [place] LOWER EXTREMITY VENOUS (DVT)  Result Date: 02/15/2021  Lower Venous DVT Study Patient Name:  [name]  Date of Exam:   02/15/2021 Medical Rec #: [id number]          Accession #:    [id number] Date of Birth: 02-Jul-1951          Patient Gender: F Patient Age:   53Y Exam Location:  [facility] Procedure:      VAS [place] LOWER EXTREMITY VENOUS (DVT) Referring Phys: [id number] JEHANZEB MEMON --------------------------------------------------------------------------------  Indications: Swelling.  Comparison Study: No prior. Performing Technologist: Oda Cogan RDMS, RVT  Examination Guidelines: A complete evaluation includes B-mode imaging, spectral Doppler, color Doppler, and power Doppler as needed of all accessible portions of each vessel. Bilateral testing is considered an integral part of a complete examination. Limited examinations for reoccurring indications may be performed as noted. The reflux portion of the exam is performed with the patient in reverse Trendelenburg.  +---------+---------------+---------+-----------+----------+--------------+ RIGHT     CompressibilityPhasicitySpontaneityPropertiesThrombus Aging +---------+---------------+---------+-----------+----------+--------------+ CFV      Full           Yes      Yes                                 +---------+---------------+---------+-----------+----------+--------------+ SFJ      Full                                                        +---------+---------------+---------+-----------+----------+--------------+ FV Prox  Full                                                        +---------+---------------+---------+-----------+----------+--------------+ FV Mid   Full                                                        +---------+---------------+---------+-----------+----------+--------------+  FV DistalFull                                                        +---------+---------------+---------+-----------+----------+--------------+ PFV      Full                                                        +---------+---------------+---------+-----------+----------+--------------+ POP      Full           Yes      Yes                                 +---------+---------------+---------+-----------+----------+--------------+ PTV      Full                                                        +---------+---------------+---------+-----------+----------+--------------+ PERO     Full                                                        +---------+---------------+---------+-----------+----------+--------------+   +---------+---------------+---------+-----------+----------+--------------+ LEFT     CompressibilityPhasicitySpontaneityPropertiesThrombus Aging +---------+---------------+---------+-----------+----------+--------------+ CFV      Full           Yes      Yes                                 +---------+---------------+---------+-----------+----------+--------------+ SFJ      Full                                                         +---------+---------------+---------+-----------+----------+--------------+ FV Prox  Full                                                        +---------+---------------+---------+-----------+----------+--------------+ FV Mid   Full                                                        +---------+---------------+---------+-----------+----------+--------------+ FV DistalFull                                                        +---------+---------------+---------+-----------+----------+--------------+  PFV      Full                                                        +---------+---------------+---------+-----------+----------+--------------+ POP      Full           Yes      Yes                                 +---------+---------------+---------+-----------+----------+--------------+ PTV      Full                                                        +---------+---------------+---------+-----------+----------+--------------+ PERO     Full                                                        +---------+---------------+---------+-----------+----------+--------------+     Summary: BILATERAL: - No evidence of deep vein thrombosis seen in the lower extremities, bilaterally. -No evidence of popliteal cyst, bilaterally.   *See table(s) above for measurements and observations.    Preliminary    [place] EKG SITE RITE  Result Date: 02/15/2021 If Site Rite image not attached, placement could not be confirmed due to current cardiac rhythm.  [place] EKG SITE RITE  Result Date: 02/15/2021 If Site Rite image not attached, placement could not be confirmed due to current cardiac rhythm.  CT CHEST ABDOMEN PELVIS WO CONTRAST  Result Date: 02/15/2021 CLINICAL DATA:  70 year old female with altered mental status and sepsis. EXAM: CT CHEST, ABDOMEN AND PELVIS WITHOUT CONTRAST TECHNIQUE: Multidetector CT imaging of the chest, abdomen and pelvis was performed  following the standard protocol without IV contrast. COMPARISON:  Chest CT dated 01/14/2021. FINDINGS: Evaluation of this exam is limited in the absence of intravenous contrast. Evaluation is also limited due to streak artifact caused by patient's arms. CT CHEST FINDINGS Cardiovascular: There is no cardiomegaly or pericardial effusion. Mild atherosclerotic calcification of the thoracic aorta. No aneurysmal dilatation. The central pulmonary arteries are grossly unremarkable. Mediastinum/Nodes: No hilar or mediastinal adenopathy. The esophagus and thyroid gland are grossly unremarkable. No mediastinal fluid collection. Lungs/Pleura: Small bilateral pleural effusions. There is minimal compressive atelectasis of the lower lobes. Pneumonia is not excluded. Clinical correlation is recommended. No pneumothorax. The central airways are patent. Musculoskeletal: Osteopenia with degenerative changes of the spine. No acute osseous pathology. CT ABDOMEN PELVIS FINDINGS No intra-abdominal free air or free fluid. Hepatobiliary: Probable fatty liver. No intrahepatic biliary dilatation. Gallstones. No pericholecystic fluid. Pancreas: There is stranding and inflammatory changes surrounding the pancreas most consistent with acute pancreatitis. Correlation with pancreatic enzymes recommended. No drainable fluid collection/abscess or pseudocyst. Spleen: Normal in size without focal abnormality. Adrenals/Urinary Tract: The adrenal glands unremarkable. There is no hydronephrosis on either side. There is symmetric enhancement and excretion of contrast by both kidneys. Several subcentimeter right renal hypodense lesions are too small to characterize. The visualized ureters appear unremarkable. The  urinary bladder is partially distended and contains excreted contrast from recent IV administration. Stomach/Bowel: There is no bowel obstruction or active inflammation. Loose stool noted throughout the colon. The appendix is normal.  Vascular/Lymphatic: Advanced aortoiliac atherosclerotic disease. The IVC is unremarkable. No portal venous gas. There is no adenopathy. Reproductive: There is a 3 cm posterior uterine body calcified fibroid. No adnexal masses. Other: There is a 3 x 3 cm hematoma in the soft tissues of the right groin. Overlying compression is noted. Musculoskeletal: Osteopenia with degenerative changes of the spine. No acute osseous pathology. IMPRESSION: 1. Acute pancreatitis. No drainable fluid collection/abscess or pseudocyst. 2. A 3 cm hematoma in the right groin. 3. Cholelithiasis. 4. Small bilateral pleural effusions with minimal compressive atelectasis of the lower lobes. Pneumonia is not excluded. Clinical correlation is recommended. 5. Aortic Atherosclerosis (ICD10-I70.0). These results were called by telephone at the time of interpretation on 02/15/2021 at 11:31 pm to the patient's nurse, Netta Corrigan, who verbally acknowledged these results. Electronically Signed   By: Anner Crete M.D.   On: 02/15/2021 23:34        Scheduled Meds:  atorvastatin  40 mg Oral Daily   busPIRone  5 mg Oral BID   collagenase   Topical BID   feeding supplement  237 mL Oral BID BM   multivitamin with minerals  1 tablet Oral Daily   nutrition supplement (JUVEN)  1 packet Oral BID BM   pantoprazole  40 mg Oral Daily   pyridostigmine  60 mg Oral TID   sodium chloride flush  3 mL Intravenous Q12H   Continuous Infusions:  sodium chloride     ceFEPime (MAXIPIME) IV 2 g (02/16/21 0840)   dextrose 5 % and 0.9% NaCl 100 mL/hr at 02/16/21 0501     LOS: 5 days        Hosie Poisson, MD [facility]   To contact the attending provider between 7A-7P or the covering provider during after hours 7P-7A, please log into the web site [email or web address] and access using universal [place] password for that web site. If you do not have the password, please call the hospital operator.  02/16/2021, 11:24 AM

## 2021-02-16 NOTE — Progress Notes (Signed)
Peripherally Inserted Central Catheter Placement  The IV Nurse has discussed with the patient and/or persons authorized to consent for the patient, the purpose of this procedure and the potential benefits and risks involved with this procedure.  The benefits include less needle sticks, lab draws from the catheter, and the patient may be discharged home with the catheter. Risks include, but not limited to, infection, bleeding, blood clot (thrombus formation), and puncture of an artery; nerve damage and irregular heartbeat and possibility to perform a PICC exchange if needed/ordered by physician.  Alternatives to this procedure were also discussed.  Bard Power PICC patient education guide, fact sheet on infection prevention and patient information card has been provided to patient /or left at bedside.    PICC Placement Documentation  PICC Double Lumen 02/16/21 PICC Left Brachial 45 cm (Active)       Jule Economy Horton 02/16/2021, 3:23 PM

## 2021-02-16 NOTE — Evaluation (Signed)
Occupational Therapy Evaluation Patient Details Name: [name] MRN: [id number] DOB: [birth year] Today's Date: 02/16/2021    History of Present Illness 70 yo admitted 7/21 to [facility] with left heel ulcer and ischemia. Transfer to [facility] 7/22. 7/25 angiography with SFA stent and Left tibioperoneal angioplasty. PMhx: GERD, anxiety/depression, HTN, HLD, OSA, gout, asthma   Clinical Impression   Pt presents with functional decline since admission (ambulated initially with PT). Pt limited by lethargy and cognitive deficits, unable to follow commands or answer basic questions. Pt repeatedly responds "ok, ok, ok" to all questions/directions though does not initiate motor movements. Pt requires Total A for all ADLs bed level at this time. Due to current presentation, recommend SNF rehab at discharge. Will progress EOB/OOB ADLs within pt's tolerance.     Follow Up Recommendations  SNF;Supervision/Assistance - 24 hour    Equipment Recommendations  Other (comment) (to be determined pending progress)    Recommendations for Other Services       Precautions / Restrictions Precautions Precautions: Fall Restrictions Weight Bearing Restrictions: No      Mobility Bed Mobility Overal bed mobility: Needs Assistance             General bed mobility comments: No initiation of bed mobility, Reports "ok ok ok" to cues to move LEs to EOB    Transfers                 General transfer comment: unable    Balance                                           ADL either performed or assessed with clinical judgement   ADL Overall ADL's : Needs assistance/impaired                                       General ADL Comments: overall Total A for all ADLs bed level at this time due to cognition/lethargy, much different than initial PT eval     Vision Patient Visual Report:  (to be further assessed) Vision Assessment?: No apparent visual deficits      Perception     Praxis      Pertinent Vitals/Pain Pain Assessment: Faces Faces Pain Scale: No hurt Pain Intervention(s): Monitored during session     Hand Dominance Right   Extremity/Trunk Assessment Upper Extremity Assessment Upper Extremity Assessment: Difficult to assess due to impaired cognition   Lower Extremity Assessment Lower Extremity Assessment: Defer to PT evaluation       Communication Communication Communication: Other (comment) (lethargic, repeats "ok ok ok")   Cognition Arousal/Alertness: Lethargic Behavior During Therapy: Flat affect Overall Cognitive Status: Difficult to assess                                 General Comments: Pt occasionally opens eyes to voice. Would not report first name to therapist, location, pain levels, etc. repeats "ok ok ok" to all interactions   General Comments  VSS on 2 L O2 (100% O2)    Exercises     Shoulder Instructions      Home Living Family/patient expects to be discharged to:: Private residence Living Arrangements: Alone Available Help at Discharge: Family;Available 24 hours/day Type of Home: [place]  Access: Stairs to enter [employer] of Steps: 2 Entrance Stairs-Rails: Right;Left;Can reach both Home Layout: One level     Bathroom Shower/Tub: Teacher, early years/pre: Handicapped height Bathroom Accessibility: Yes   Home Equipment: Environmental consultant - 2 wheels;Cane - single point;Shower seat   Additional Comments: home setup info obtained from PT eval as pt poor historian      Prior Functioning/Environment Level of Independence: Needs assistance  Gait / Transfers Assistance Needed: household ambulator using RW ADL's / Homemaking Assistance Needed: assisted by family   Comments: obtained info from PT eval as pt poor historian and unable to provide info        OT Problem List: Decreased strength;Decreased activity tolerance;Impaired balance (sitting and/or  standing);Decreased cognition;Decreased safety awareness      OT Treatment/Interventions: Self-care/ADL training;Therapeutic exercise;Energy conservation;DME and/or AE instruction;Therapeutic activities;Patient/family education;Balance training    OT Goals(Current goals can be found in the care plan section) Acute Rehab OT Goals Patient Stated Goal: unable to state OT Goal Formulation: Patient unable to participate in goal setting Time For Goal Achievement: 03/02/21 Potential to Achieve Goals: Fair ADL Goals Pt Will Perform Grooming: with min assist;sitting Pt Will Perform Upper Body Bathing: with mod assist;sitting Pt Will Perform Lower Body Bathing: with max assist;sit to/from stand;sitting/lateral leans Additional ADL Goal #1: Pt to demo bed mobility with Min A in prep for ADL transfers  OT Frequency: Min 2X/week   Barriers to D/C:            Co-evaluation              AM-PAC OT "6 Clicks" Daily Activity     Outcome Measure Help from another person eating meals?: Total Help from another person taking care of personal grooming?: Total Help from another person toileting, which includes using toliet, bedpan, or urinal?: Total Help from another person bathing (including washing, rinsing, drying)?: Total Help from another person to put on and taking off regular upper body clothing?: Total Help from another person to put on and taking off regular lower body clothing?: Total 6 Click Score: 6   End of Session Nurse Communication: Mobility status  Activity Tolerance: Patient limited by lethargy;Other (comment) (limited by cognition) Patient left: in bed;with call bell/phone within reach;with bed alarm set  OT Visit Diagnosis: Unsteadiness on feet (R26.81);Other abnormalities of gait and mobility (R26.89);Muscle weakness (generalized) (M62.81);Other symptoms and signs involving cognitive function                Time: 5465-0354 OT Time Calculation (min): 10 min Charges:  OT  General Charges $OT Visit: 1 Visit OT Evaluation $OT Eval Moderate Complexity: 1 Mod  Malachy Chamber, OTR/L Acute Rehab Services Office: [phone]   Layla Maw 02/16/2021, 10:22 AM

## 2021-02-16 NOTE — Progress Notes (Signed)
Pharmacy Antibiotic Note  [name] is a 70 y.o. female admitted on 02/11/2021 with  possible osteomyelitis .  Pharmacy has been consulted for vancomycin and cefepime dosing.  Assessment: Vancomycin 2g load given 7/21, followed by 1500 mg daily. Last dose charted was 02/14/21 at 1716. Since last dose,  two levels have been drawn (10.75 hours apart) and both have been ~42-43. Scr 1.14>>1.39 and trending up. Will continue to hold vancomycin for now and will get f/u random level in AM on 7/27. Will continue to follow renal function labs.   Plan: Hold vancomycin for now until level <20 Get random level in AM 7/27 Adjusted Cefepime to 2000 mg every 12 hours  Height: 5\' 5"  (165.1 cm) Weight: 105.9 kg (233 lb 7.5 oz) IBW/kg (Calculated) : 57  Temp (24hrs), Avg:96.4 F (35.8 C), Min:93.9 F (34.4 C), Max:99 F (37.2 C)  Recent Labs  Lab 02/11/21 1432 02/12/21 0349 02/14/21 0124 02/15/21 0755 02/15/21 1249 02/15/21 1729 02/15/21 1730 02/15/21 2316 02/16/21 0959  WBC 10.9* 10.4 15.1* 28.4*  --   --  27.0*  --  19.0*  CREATININE 1.02* 0.96 1.18* 1.18*  1.14*  --   --   --   --  1.39*  LATICACIDVEN  --   --   --   --  0.9 0.8  --   --   --   VANCOPEAK  --   --   --   --   --   --   --  42*  --   VANCORANDOM  --   --   --   --   --   --   --   --  43     Estimated Creatinine Clearance: 44.9 mL/min (A) (by C-G formula based on SCr of 1.39 mg/dL (H)).    No Known Allergies  Antimicrobials this admission: Vancomycin 7/21 >>  Cefepime 7/21 >>    Microbiology results: 7/21 BCx: no growth to date  Talen Poser A. Levada Dy, PharmD, BCPS, FNKF Clinical Pharmacist [place] Please utilize Amion for appropriate phone number to reach the unit pharmacist ([place])

## 2021-02-16 NOTE — Progress Notes (Addendum)
Physical Therapy Treatment- Reeval Patient Details Name: [name] MRN: [id number] DOB: 09-04-1950 Today's Date: 02/16/2021    History of Present Illness 70 yo admitted 7/21 to [facility] with left heel ulcer and ischemia. Transfer to [facility] 7/22. 7/25 angiography with SFA stent and Left tibioperoneal angioplasty. Pt with AMS post procedure with head CT negative.  PMhx: GERD, anxiety/depression, HTN, HLD, OSA, gout, asthma    PT Comments    Pt lethargic on arrival with inability to respond to questions or commands this session. Pt with bed soaked in urine on arrival due to leaking purewick with pt total +2 assist to roll, reposition and perform pericare. Pt with eyes closed but once lids assisted open pt able to maintain eyes open but with right gaze preference and did not cross midline to left. Pt with blink to threat bil. LLE moved on command for return to bed and bil feet withdrawal to stimuli only. Pt positioned in chair position with tv on with pt appearing to attend to tv and bil UE elevated. Pt with significant change in status from eval with goals downgraded and D/C plan updated. Pt will benefit from acute therapy to maximize cognition, function and mobility to return to PLOF.   SpO2 100% on RA    Follow Up Recommendations  SNF;Supervision/Assistance - 24 hour     Equipment Recommendations  Wheelchair (measurements PT);Wheelchair cushion (measurements PT);Hospital bed    Recommendations for Other Services       Precautions / Restrictions Precautions Precautions: Fall Restrictions Weight Bearing Restrictions: No    Mobility  Bed Mobility Overal bed mobility: Needs Assistance Bed Mobility: Rolling Rolling: Total assist;+2 for physical assistance         General bed mobility comments: total +2 assist to roll bil with pt able to maintain sidelying with grasp of rail once in sidelying. Pt did not assist with rolling left. Total +2 to slide toward [facility] and position bed into  chair position    Transfers                 General transfer comment: unable  Ambulation/Gait                 Stairs             Wheelchair Mobility    Modified Rankin (Stroke Patients Only)       Balance                                            Cognition Arousal/Alertness: Lethargic Behavior During Therapy: Flat affect Overall Cognitive Status: Impaired/Different from baseline Area of Impairment: Attention;Following commands                               General Comments: pt lethargic and would respond to multimodal cues to move LLE with hip adduction only. Did not respond to commands, questions or multimodal cues. Pt stating "Ok, ok" and "uh huh". Pt maintained right visual fixation even with neck rotation bil able to achieve midline visual field at best. Pt with eyes closed but did maintain open once lids assisted open      Exercises      General Comments General comments (skin integrity, edema, etc.): VSS on 2 L O2 (100% O2)      Pertinent Vitals/Pain Pain Assessment: Faces  Faces Pain Scale: Hurts a little bit Pain Location: withdrawal to touching bil feet Pain Intervention(s): Limited activity within patient's tolerance;Repositioned    Home Living Family/patient expects to be discharged to:: Private residence Living Arrangements: Alone Available Help at Discharge: Family;Available 24 hours/day Type of Home: House Home Access: Stairs to enter Entrance Stairs-Rails: Right;Left;Can reach both Home Layout: One level Home Equipment: Environmental consultant - 2 wheels;Cane - single point;Shower seat Additional Comments: home setup info obtained from PT eval as pt poor historian    Prior Function Level of Independence: Needs assistance  Gait / Transfers Assistance Needed: household ambulator using RW ADL's / Homemaking Assistance Needed: assisted by family Comments: obtained info from PT eval as pt poor historian and unable  to provide info   PT Goals (current goals can now be found in the care plan section) Acute Rehab PT Goals Patient Stated Goal: unable to state PT Goal Formulation: Patient unable to participate in goal setting Time For Goal Achievement: 03/02/21 Potential to Achieve Goals: Fair Progress towards PT goals: Goals downgraded-see care plan    Frequency    Min 3X/week      PT Plan Discharge plan needs to be updated;Frequency needs to be updated    Co-evaluation              AM-PAC PT "6 Clicks" Mobility   Outcome Measure  Help needed turning from your back to your side while in a flat bed without using bedrails?: Total Help needed moving from lying on your back to sitting on the side of a flat bed without using bedrails?: Total Help needed moving to and from a bed to a chair (including a wheelchair)?: Total Help needed standing up from a chair using your arms (e.g., wheelchair or bedside chair)?: Total Help needed to walk in hospital room?: Total Help needed climbing 3-5 steps with a railing? : Total 6 Click Score: 6    End of Session   Activity Tolerance: Patient limited by lethargy Patient left: in bed;with call bell/phone within reach;with nursing/sitter in room;with bed alarm set Nurse Communication: Mobility status PT Visit Diagnosis: Other abnormalities of gait and mobility (R26.89);Muscle weakness (generalized) (M62.81);Other symptoms and signs involving the nervous system (E08.144)     Time: 8185-6314 PT Time Calculation (min) (ACUTE ONLY): 20 min  Charges:                        Bayard Males, PT Acute Rehabilitation Services Pager: [phone] Office: [phone]    Rivers Hamrick B Alberto Schoch 02/16/2021, 11:29 AM

## 2021-02-17 DIAGNOSIS — R4182 Altered mental status, unspecified: Secondary | ICD-10-CM | POA: Diagnosis not present

## 2021-02-17 DIAGNOSIS — G9341 Metabolic encephalopathy: Secondary | ICD-10-CM

## 2021-02-17 DIAGNOSIS — E1159 Type 2 diabetes mellitus with other circulatory complications: Secondary | ICD-10-CM | POA: Diagnosis not present

## 2021-02-17 DIAGNOSIS — G9340 Encephalopathy, unspecified: Secondary | ICD-10-CM | POA: Diagnosis not present

## 2021-02-17 DIAGNOSIS — G7000 Myasthenia gravis without (acute) exacerbation: Secondary | ICD-10-CM | POA: Diagnosis not present

## 2021-02-17 DIAGNOSIS — G4733 Obstructive sleep apnea (adult) (pediatric): Secondary | ICD-10-CM | POA: Diagnosis not present

## 2021-02-17 DIAGNOSIS — D72829 Elevated white blood cell count, unspecified: Secondary | ICD-10-CM | POA: Diagnosis not present

## 2021-02-17 LAB — BLOOD GAS, VENOUS
Acid-base deficit: 5.8 mmol/L — ABNORMAL HIGH (ref 0.0–2.0)
Acid-base deficit: 5.3 mmol/L — ABNORMAL HIGH (ref 0.0–2.0)
Bicarbonate: 20.6 mmol/L (ref 20.0–28.0)
Bicarbonate: 20.7 mmol/L (ref 20.0–28.0)
FIO2: 28
FIO2: 28
O2 Saturation: 75.6 %
O2 Saturation: 84.8 %
Patient temperature: 36.5
Patient temperature: 37
pCO2, Ven: 50.6 mmHg (ref 44.0–60.0)
pCO2, Ven: 48.3 mmHg (ref 44.0–60.0)
pH, Ven: 7.23 — ABNORMAL LOW (ref 7.250–7.430)
pH, Ven: 7.254 (ref 7.250–7.430)
pO2, Ven: 44.8 mmHg (ref 32.0–45.0)
pO2, Ven: 53.7 mmHg — ABNORMAL HIGH (ref 32.0–45.0)

## 2021-02-17 LAB — BASIC METABOLIC PANEL
Anion gap: 3 — ABNORMAL LOW (ref 5–15)
BUN: 31 mg/dL — ABNORMAL HIGH (ref 8–23)
CO2: 22 mmol/L (ref 22–32)
Calcium: 8.5 mg/dL — ABNORMAL LOW (ref 8.9–10.3)
Chloride: 116 mmol/L — ABNORMAL HIGH (ref 98–111)
Creatinine, Ser: 1.25 mg/dL — ABNORMAL HIGH (ref 0.44–1.00)
GFR, Estimated: 46 mL/min — ABNORMAL LOW (ref >60)
Glucose, Bld: 93 mg/dL (ref 70–99)
Potassium: 3.5 mmol/L (ref 3.5–5.1)
Sodium: 141 mmol/L (ref 135–145)

## 2021-02-17 LAB — GLUCOSE, CAPILLARY
Glucose-Capillary: 93 mg/dL (ref 70–99)
Glucose-Capillary: 94 mg/dL (ref 70–99)
Glucose-Capillary: 110 mg/dL — ABNORMAL HIGH (ref 70–99)
Glucose-Capillary: 132 mg/dL — ABNORMAL HIGH (ref 70–99)
Glucose-Capillary: 127 mg/dL — ABNORMAL HIGH (ref 70–99)

## 2021-02-17 LAB — URINE CULTURE: Culture: 30000 — AB

## 2021-02-17 LAB — PROCALCITONIN: Procalcitonin: 0.39 ng/mL

## 2021-02-17 LAB — AMMONIA: Ammonia: 28 umol/L (ref 9–35)

## 2021-02-17 NOTE — Progress Notes (Signed)
Mobility Specialist: Progress Note   02/17/21 1630  Mobility  Activity  (Long sit in bed)  Level of Assistance Minimal assist, patient does 75% or more  Assistive Device None  Mobility Sit up in bed/chair position for meals  Mobility Response Tolerated well  Mobility performed by Mobility specialist;Other (comment) (PTA [place])  $Mobility charge 1 Mobility   Pt +2 assist with long sit in the bed. Pt required modA to sit up initially but was able to progress to minA after verbal cues for optimal hand placement on bed rails. Pt in chair position with PTA still in the room.   [facility] Breaker Springer Mobility Specialist Mobility Specialist Phone: [phone]

## 2021-02-17 NOTE — NC FL2 (Signed)
[place] LEVEL OF CARE SCREENING TOOL     IDENTIFICATION  Patient Name: [name] Birthdate: 01-09-1951 Sex: female Admission Date (Current Location): 02/11/2021  [facility] and [place] Number:  Herbalist and Address:  The [place], [place] [address], [place], [place] [address]      Provider Number: [id number]  Attending Physician Name and Address:  Hosie Poisson, MD  Relative Name and Phone Number:       Current Level of Care: Hospital Recommended Level of Care: [place] Prior Approval Number:    Date Approved/Denied:   PASRR Number: Under reveiw  Discharge Plan: SNF    Current Diagnoses: Patient Active Problem List   Diagnosis Date Noted   Hypomagnesemia 02/12/2021   Social problem 02/12/2021   DM2 (diabetes mellitus, type 2) ([place]) 02/12/2021   Open wound of plantar aspect of foot    Osteomyelitis ([place]) 02/11/2021   Abscess of right lower extremity excluding foot 10/18/2020   Abscess of right leg 10/18/2020   Dehydration 10/03/2020   Leukocytosis 10/03/2020   Thrombocytosis 10/03/2020   Hyponatremia 10/03/2020   Hypochloremia 10/03/2020   Failure to thrive in adult 10/03/2020   Acute renal failure superimposed on stage 3b chronic kidney disease ([place]) 10/03/2020   Uncontrolled type 2 diabetes mellitus with hyperglycemia, without long-term current use of insulin ([place]) 10/03/2020   Acute kidney injury superimposed on CKD ([place]) 10/02/2020   Chronic kidney disease 10/01/2020   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[place]) 05/12/2020   Chronic insomnia 01/08/2020   Ocular myasthenia gravis ([place]) 09/18/2019   Peripheral edema 01/23/2019   Asthma 05/14/2015   Heel spur 02/24/2015   Depression 02/24/2015   Post-menopausal bleeding 06/02/2014   OA (osteoarthritis) of knee 04/09/2014   Epidermal cyst 10/01/2013   Class 3 obesity 02/25/2013   Back pain 03/04/2012   Gout 01/25/2012   Edema 01/28/2011    Obesity hypoventilation syndrome ([place]) 06/16/2008   OBSTRUCTIVE SLEEP APNEA 05/08/2008   HLD (hyperlipidemia) 08/28/2006   Anxiety 08/28/2006   Essential hypertension 08/28/2006   GERD 08/28/2006   Osteoarthritis 08/28/2006    Orientation RESPIRATION BLADDER Height & Weight     Self  O2 (patient uses CPAP) External catheter, Incontinent Weight: 234 lb 9.1 oz (106.4 kg) Height:  5\' 5"  (165.1 cm)  BEHAVIORAL SYMPTOMS/MOOD NEUROLOGICAL BOWEL NUTRITION STATUS      Incontinent Diet (please see discharge planning)  AMBULATORY STATUS COMMUNICATION OF NEEDS Skin   Limited Assist Verbally Other (Comment) (wound incision Left Heel)                       Personal Care Assistance Level of Assistance  Bathing, Feeding, Dressing Bathing Assistance: Limited assistance Feeding assistance: Limited assistance Dressing Assistance: Limited assistance     Functional Limitations Info  Sight, Hearing, Speech Sight Info: Adequate Hearing Info: Adequate      SPECIAL CARE FACTORS FREQUENCY  PT (By licensed PT), OT (By licensed OT)     PT Frequency: 5x per week OT Frequency: 5x per week            Contractures Contractures Info: Not present    Additional Factors Info  Code Status, Allergies, Psychotropic Code Status Info: FULL Code Allergies Info: NKA Psychotropic Info: busPIRone (BUSPAR) tablet 5 mg         Current Medications (02/17/2021):  This is the current hospital active medication list Current Facility-Administered Medications  Medication Dose Route Frequency Provider Last Rate  Last Admin   0.9 %  sodium chloride infusion  250 mL Intravenous PRN Waynetta Sandy, MD       acetaminophen (TYLENOL) tablet 650 mg  650 mg Oral Q6H PRN Kathie Dike, MD       Or   acetaminophen (TYLENOL) suppository 650 mg  650 mg Rectal Q6H PRN Kathie Dike, MD       acetaminophen (TYLENOL) tablet 650 mg  650 mg Oral Q4H PRN Waynetta Sandy, MD       albuterol  (PROVENTIL) (2.5 MG/3ML) 0.083% nebulizer solution 2.5 mg  2.5 mg Nebulization Q4H PRN Waynetta Sandy, MD       atorvastatin (LIPITOR) tablet 40 mg  40 mg Oral Daily Waynetta Sandy, MD   40 mg at 02/17/21 0932   busPIRone (BUSPAR) tablet 5 mg  5 mg Oral BID Waynetta Sandy, MD   5 mg at 02/17/21 6712   cefTRIAXone (ROCEPHIN) 2 g in sodium chloride 0.9 % 100 mL IVPB  2 g Intravenous Q24H Hosie Poisson, MD 200 mL/hr at 02/16/21 2132 2 g at 02/16/21 2132   Chlorhexidine Gluconate Cloth 2 % PADS 6 each  6 each Topical Daily Hosie Poisson, MD   6 each at 02/17/21 0843   collagenase (SANTYL) ointment   Topical BID Waynetta Sandy, MD   Given at 02/17/21 0843   dextrose 5 %-0.9 % sodium chloride infusion   Intravenous Continuous Waynetta Sandy, MD 100 mL/hr at 02/17/21 1500 Infusion Verify at 02/17/21 1500   feeding supplement (ENSURE ENLIVE / ENSURE PLUS) liquid 237 mL  237 mL Oral BID BM Waynetta Sandy, MD   237 mL at 02/17/21 0843   hydrALAZINE (APRESOLINE) injection 5 mg  5 mg Intravenous Q20 Min PRN Waynetta Sandy, MD       HYDROcodone-acetaminophen [facility]) 7.5-325 MG per tablet 1 tablet  1 tablet Oral BID PRN Waynetta Sandy, MD   1 tablet at 02/15/21 1738   labetalol (NORMODYNE) injection 10 mg  10 mg Intravenous Q10 min PRN Waynetta Sandy, MD       lactulose (CHRONULAC) 10 GM/15ML solution 20 g  20 g Oral TID Hosie Poisson, MD   20 g at 02/17/21 0841   morphine 2 MG/ML injection 2 mg  2 mg Intravenous Q2H PRN Waynetta Sandy, MD   2 mg at 02/12/21 2329   multivitamin with minerals tablet 1 tablet  1 tablet Oral Daily Waynetta Sandy, MD   1 tablet at 02/17/21 4580   naloxone [facility]) injection 0.4 mg  0.4 mg Intravenous PRN Shela Leff, MD   0.4 mg at 02/15/21 2210   nutrition supplement (JUVEN) (JUVEN) powder packet 1 packet  1 packet Oral BID BM Waynetta Sandy, MD    1 packet at 02/17/21 0841   ondansetron (ZOFRAN) tablet 4 mg  4 mg Oral Q6H PRN Waynetta Sandy, MD       Or   ondansetron [facility]) injection 4 mg  4 mg Intravenous Q6H PRN Waynetta Sandy, MD   4 mg at 02/13/21 1829   pantoprazole (PROTONIX) EC tablet 40 mg  40 mg Oral Daily Waynetta Sandy, MD   40 mg at 02/17/21 0842   polyethylene glycol (MIRALAX / GLYCOLAX) packet 17 g  17 g Oral Daily PRN Waynetta Sandy, MD       pyridostigmine (MESTINON) tablet 60 mg  60 mg Oral TID Waynetta Sandy, MD   [phone]  mg at 02/17/21 0841   sodium chloride flush (NS) 0.9 % injection 10-40 mL  10-40 mL Intracatheter Q12H Hosie Poisson, MD   10 mL at 02/17/21 0844   sodium chloride flush (NS) 0.9 % injection 10-40 mL  10-40 mL Intracatheter PRN Hosie Poisson, MD       sodium chloride flush (NS) 0.9 % injection 3 mL  3 mL Intravenous Q12H Waynetta Sandy, MD   3 mL at 02/17/21 0844   sodium chloride flush (NS) 0.9 % injection 3 mL  3 mL Intravenous PRN Waynetta Sandy, MD       traZODone (DESYREL) tablet 100 mg  100 mg Oral QHS PRN Waynetta Sandy, MD   100 mg at 02/13/21 2149     Discharge Medications: Please see discharge summary for a list of discharge medications.  Relevant Imaging Results:  Relevant Lab Results:   Additional Information SSN [id number]  Moderna COVID-19 Vaccine 04/02/2020 , 03/01/2020  patient uses cpap- full face mask, medium, Respiratory Rate 18 EPAP 8 Flow  Rate 1  Vinie Sill, LCSW

## 2021-02-17 NOTE — Progress Notes (Signed)
  Progress Note    02/17/2021 7:22 AM 2 Days Post-Op  Subjective:  Delirium persists   Vitals:   02/16/21 2310 02/17/21 0300  BP: (!) 108/50 115/69  Pulse: 84 74  Resp: 16 14  Temp: 97.9 F (36.6 C) 97.8 F (36.6 C)  SpO2: 100% 95%    Physical Exam: General appearance: Awake, alert in no apparent distress. Not oriented. Cardiac: Heart rate and rhythm are regular Respirations: Nonlabored Incisions: Right groin puncture site is soft without bleeding or hematoma Extremities: Both feet are warm with brisk bilateral dorsalis pedis Doppler signals  Left heel dressing is dry and intact   CBC    Component Value Date/Time   WBC 19.0 (H) 02/16/2021 0959   RBC 2.54 (L) 02/16/2021 0959   HGB 7.9 (L) 02/16/2021 0959   HCT 24.0 (L) 02/16/2021 0959   PLT 261 02/16/2021 0959   MCV 94.5 02/16/2021 0959   MCH 31.1 02/16/2021 0959   MCHC 32.9 02/16/2021 0959   RDW 17.5 (H) 02/16/2021 0959   LYMPHSABS 0.9 02/15/2021 1730   MONOABS 1.7 (H) 02/15/2021 1730   EOSABS 0.4 02/15/2021 1730   BASOSABS 0.1 02/15/2021 1730    BMET    Component Value Date/Time   NA 141 02/17/2021 0431   K 3.5 02/17/2021 0431   CL 116 (H) 02/17/2021 0431   CO2 22 02/17/2021 0431   GLUCOSE 93 02/17/2021 0431   BUN 31 (H) 02/17/2021 0431   CREATININE 1.25 (H) 02/17/2021 0431   CREATININE 4.23 (H) 10/01/2020 1204   CALCIUM 8.5 (L) 02/17/2021 0431   GFRNONAA 46 (L) 02/17/2021 0431   GFRNONAA 10 (L) 10/01/2020 1204   GFRAA 11 (L) 10/01/2020 1204     Intake/Output Summary (Last 24 hours) at 02/17/2021 1572 Last data filed at 02/17/2021 6203 Gross per 24 hour  Intake 1318 ml  Output 650 ml  Net 668 ml    HOSPITAL MEDICATIONS Scheduled Meds:  atorvastatin  40 mg Oral Daily   busPIRone  5 mg Oral BID   Chlorhexidine Gluconate Cloth  6 each Topical Daily   collagenase   Topical BID   feeding supplement  237 mL Oral BID BM   lactulose  20 g Oral TID   multivitamin with minerals  1 tablet Oral  Daily   nutrition supplement (JUVEN)  1 packet Oral BID BM   pantoprazole  40 mg Oral Daily   pyridostigmine  60 mg Oral TID   sodium chloride flush  10-40 mL Intracatheter Q12H   sodium chloride flush  3 mL Intravenous Q12H   Continuous Infusions:  sodium chloride     cefTRIAXone (ROCEPHIN)  IV 2 g (02/16/21 2132)   dextrose 5 % and 0.9% NaCl 100 mL/hr at 02/17/21 0611   PRN Meds:.sodium chloride, acetaminophen **OR** acetaminophen, acetaminophen, albuterol, hydrALAZINE, HYDROcodone-acetaminophen, labetalol, morphine injection, naloxone, ondansetron **OR** ondansetron (ZOFRAN) IV, polyethylene glycol, sodium chloride flush, sodium chloride flush, traZODone  Assessment and Plan:  left heel wounds chronic limb threatening ischemia left lower extremity s/p drug-coated balloon angioplasty left tibioperoneal trunk with 4 mm Ranger and stent of left SFA with 6 x 80 mm Elluvia   Continue aspirin, statin. Start Plavix when current work-up complete>>per medicine service.   Risa Grill, PA-C [facility] [phone] 02/17/2021  7:22 AM

## 2021-02-17 NOTE — Evaluation (Signed)
Clinical/Bedside Swallow Evaluation Patient Details  Name: [name] MRN: [id number] Date of Birth: 02/21/1951  Today's Date: 02/17/2021 Time: SLP Start Time (ACUTE ONLY): 0847 SLP Stop Time (ACUTE ONLY): 6440 SLP Time Calculation (min) (ACUTE ONLY): 10 min  Past Medical History:  Past Medical History:  Diagnosis Date   Achilles tendinitis    Anxiety    Asthma    Depression    GERD (gastroesophageal reflux disease)    Gout    H/O myasthenia gravis    left eye   HTN (hypertension)    Hyperlipidemia    Low back pain    Obesity hypoventilation syndrome ([place])    Obstructive sleep apnea    Osteoarthritis    Past Surgical History:  Past Surgical History:  Procedure Laterality Date   ABDOMINAL AORTOGRAM W/LOWER EXTREMITY N/A 02/15/2021   Procedure: ABDOMINAL AORTOGRAM W/LOWER EXTREMITY;  Surgeon: Waynetta Sandy, MD;  Location: [place] [facility];  Service: Cardiovascular;  Laterality: N/A;   CATARACT EXTRACTION W/PHACO Left 09/17/2015   Procedure: CATARACT EXTRACTION PHACO AND INTRAOCULAR LENS PLACEMENT LEFT EYE cde=8.58;  Surgeon: Tonny Branch, MD;  Location: [facility];  Service: Ophthalmology;  Laterality: Left;   CATARACT EXTRACTION W/PHACO Right 05/15/2017   Procedure: CATARACT EXTRACTION PHACO AND INTRAOCULAR LENS PLACEMENT (IOC);  Surgeon: Tonny Branch, MD;  Location: [facility];  Service: Ophthalmology;  Laterality: Right;  CDE: 6.34   COLONOSCOPY N/A 12/25/2013   Procedure: COLONOSCOPY;  Surgeon: Rogene Houston, MD;  [facility];  Service: Endoscopy;  Laterality: N/A;  730-rescheduled Ann notified pt   COLONOSCOPY WITH PROPOFOL N/A 10/04/2019   Procedure: COLONOSCOPY WITH PROPOFOL;  Surgeon: Rogene Houston, MD;  [facility];  Service: Endoscopy;  Laterality: N/A;  815   CYST EXCISION N/A 09/12/2016   Procedure: EXCISION SEBACEOUS CYST, BACK;  Surgeon: Aviva Signs, MD;  Location: [facility];  Service: General;  Laterality: N/A;   INTRAOCULAR LENS  INSERTION Bilateral 2018   Dr. Tonny Branch    PERIPHERAL VASCULAR BALLOON ANGIOPLASTY Left 02/15/2021   Procedure: PERIPHERAL VASCULAR BALLOON ANGIOPLASTY;  Surgeon: Waynetta Sandy, MD;  Location: [place] [facility];  Service: Cardiovascular;  Laterality: Left;  tp trunk   PERIPHERAL VASCULAR INTERVENTION Left 02/15/2021   Procedure: PERIPHERAL VASCULAR INTERVENTION;  Surgeon: Waynetta Sandy, MD;  Location: [place] [facility];  Service: Cardiovascular;  Laterality: Left;  SFA   POLYPECTOMY  10/04/2019   Procedure: POLYPECTOMY;  Surgeon: Rogene Houston, MD;  [facility];  Service: Endoscopy;;   TUBAL LIGATION     HPI:  70 yo admitted 7/21 to [facility] with left heel ulcer and ischemia. Transfer to [facility] 7/22. 7/25 angiography with SFA stent and Left tibioperoneal angioplasty. Pt with AMS post procedure with head CT and MRI negative.  PMhx: GERD, anxiety/depression, HTN, HLD, OSA, gout, asthma   Assessment / Plan / Recommendation Clinical Impression  Pt presents with what is suspected to be a primarily cognitive dysphagia, with oral holding noted with purees and more solid textures. She makes no attempt to bite or masticate solids despite attempts at larger vs smaller bites and providing softer textures. All solids were manually removed from her mouth. Although she still has oral holding with purees, she does clear her oral cavity well when provided a liquid wash, which she swallows more automatically and without any overt s/s of aspiration. Will adjust diet to Dys 1 (puree) and thin liquids with full supervision recommended. NT and RN  were both updated on diet and strategies recommended. SLP Visit Diagnosis: Dysphagia, oral phase (R13.11)    Aspiration Risk  Risk for inadequate nutrition/hydration;Moderate aspiration risk    Diet Recommendation Dysphagia 1 (Puree);Thin liquid   Liquid Administration via: Cup;Straw Medication Administration: Crushed with  puree Supervision: Staff to assist with self feeding;Full supervision/cueing for compensatory strategies Compensations: Minimize environmental distractions;Slow rate;Small sips/bites Postural Changes: Seated upright at 90 degrees    Other  Recommendations Oral Care Recommendations: Oral care BID   Follow up Recommendations Skilled Nursing facility      Frequency and Duration min 2x/week  2 weeks       Prognosis Prognosis for Safe Diet Advancement: Good Barriers to Reach Goals: Cognitive deficits      Swallow Study   General HPI: 70 yo admitted 7/21 to [facility] with left heel ulcer and ischemia. Transfer to [facility] 7/22. 7/25 angiography with SFA stent and Left tibioperoneal angioplasty. Pt with AMS post procedure with head CT and MRI negative.  PMhx: GERD, anxiety/depression, HTN, HLD, OSA, gout, asthma Type of Study: Bedside Swallow Evaluation Previous Swallow Assessment: none in chart Diet Prior to this Study: Regular;Thin liquids Temperature Spikes Noted: No Respiratory Status: Room air History of Recent Intubation: No Behavior/Cognition: Alert;Requires cueing Oral Cavity Assessment: Within Functional Limits Oral Care Completed by SLP: No Oral Cavity - Dentition: Poor condition;Missing dentition Self-Feeding Abilities: Total assist Patient Positioning: Upright in bed Baseline Vocal Quality: Normal    Oral/Motor/Sensory Function Overall Oral Motor/Sensory Function: Generalized oral weakness (not consistently following commands but no overt focal weakness; generally reduced ROM though)   Ice Chips Ice chips: Not tested   Thin Liquid Thin Liquid: Within functional limits Presentation: Straw    Nectar Thick Nectar Thick Liquid: Not tested   Honey Thick Honey Thick Liquid: Not tested   Puree Puree: Impaired Presentation: Spoon Oral Phase Functional Implications: Oral holding   Solid     Solid: Impaired Oral Phase Impairments: Impaired mastication      Osie Bond., M.A.  [place] Pager [phone] Office [phone]  02/17/2021,9:05 AM

## 2021-02-17 NOTE — Consult Note (Addendum)
NEURO HOSPITALIST CONSULT NOTE   Requesting physician: Dr. Karleen Hampshire  Reason for Consult: Altered mental status  HPI:                                                                                                                                          [name] is a 70 y.o. female with hypertension, anxiety, depression, and OSA/OHS who was admitted to the hospitalist service 02/11/21 for a non-healing left foot wound with associated purulent cellulitis. She underwent left lower extremity angioplasty on 02/15/21. The night following the procedure, she became encephalopathic.  She underwent encephalopathy evaluation with CT, MRI, TSH, B12, cortisol, ammonia, and UA. Only significant abnormality was an elevated ammonia level at 54. EEG was consistent with a toxic metabolic picture, no seizures present. Symptoms persisted this morning so neurology was consulted for further evaluation.   Bedside nursing staff note that she was walking, talking, feeding herself prior to undergoing the procedure on Monday.  [name] was unable to participate in HPI today due to her mental status. Attempted to contact patient's son, however was sent to voicemail.  On follow up assessment by Neurology attending, the patient's daughter is present. She states that her mother has significantly improved in terms of her mentation since yesterday.   Past Medical History:  Diagnosis Date   Achilles tendinitis    Anxiety    Asthma    Depression    GERD (gastroesophageal reflux disease)    Gout    H/O myasthenia gravis    left eye   HTN (hypertension)    Hyperlipidemia    Low back pain    Obesity hypoventilation syndrome ([place])    Obstructive sleep apnea    Osteoarthritis     Past Surgical History:  Procedure Laterality Date   ABDOMINAL AORTOGRAM W/LOWER EXTREMITY N/A 02/15/2021   Procedure: ABDOMINAL AORTOGRAM W/LOWER EXTREMITY;  Surgeon: Waynetta Sandy, MD;  Location: [facility];  Service: Cardiovascular;  Laterality: N/A;   CATARACT EXTRACTION W/PHACO Left 09/17/2015   Procedure: CATARACT EXTRACTION PHACO AND INTRAOCULAR LENS PLACEMENT LEFT EYE cde=8.58;  Surgeon: Tonny Branch, MD;  Location: [facility];  Service: Ophthalmology;  Laterality: Left;   CATARACT EXTRACTION W/PHACO Right 05/15/2017   Procedure: CATARACT EXTRACTION PHACO AND INTRAOCULAR LENS PLACEMENT (IOC);  Surgeon: Tonny Branch, MD;  Location: [facility];  Service: Ophthalmology;  Laterality: Right;  CDE: 6.34   COLONOSCOPY N/A 12/25/2013   Procedure: COLONOSCOPY;  Surgeon: Rogene Houston, MD;  [facility]: [facility];  Service: Endoscopy;  Laterality: N/A;  730-rescheduled Ann notified pt   COLONOSCOPY WITH PROPOFOL N/A 10/04/2019   Procedure: COLONOSCOPY WITH PROPOFOL;  Surgeon: Rogene Houston, MD;  [facility]: [facility];  Service: Endoscopy;  Laterality: N/A;  815   CYST EXCISION N/A 09/12/2016   Procedure: EXCISION SEBACEOUS CYST, BACK;  Surgeon: Aviva Signs, MD;  Location: [facility];  Service: General;  Laterality: N/A;   INTRAOCULAR LENS INSERTION Bilateral 2018   Dr. Tonny Branch    PERIPHERAL VASCULAR BALLOON ANGIOPLASTY Left 02/15/2021   Procedure: PERIPHERAL VASCULAR BALLOON ANGIOPLASTY;  Surgeon: Waynetta Sandy, MD;  Location: [facility];  Service: Cardiovascular;  Laterality: Left;  tp trunk   PERIPHERAL VASCULAR INTERVENTION Left 02/15/2021   Procedure: PERIPHERAL VASCULAR INTERVENTION;  Surgeon: Waynetta Sandy, MD;  Location: [facility];  Service: Cardiovascular;  Laterality: Left;  SFA   POLYPECTOMY  10/04/2019   Procedure: POLYPECTOMY;  Surgeon: Rogene Houston, MD;  [facility];  Service: Endoscopy;;   TUBAL LIGATION      Family History  Problem Relation Age of Onset   Heart disease Mother    Thyroid disease Mother    Heart failure Father    Lung disease Father    Diabetes Brother    Crohn's disease Daughter          Social History:   reports that she quit smoking about 15 years ago. Her smoking use included cigarettes. She started smoking about 51 years ago. She has a 72.00 pack-year smoking history. She has never used smokeless tobacco. She reports that she does not drink alcohol and does not use drugs.  No Known Allergies  MEDICATIONS:                                                                                                                     No current facility-administered medications on file prior to encounter.   Current Outpatient Medications on File Prior to Encounter  Medication Sig Dispense Refill   albuterol (VENTOLIN HFA) 108 (90 Base) MCG/ACT inhaler INHALE 2 PUFFS INTO THE LUNGS EVERY 4 HOURS AS NEEDED FOR WHEEZING OR SHORTNESS OF BREATH (Patient taking differently: Inhale 2 puffs into the lungs every 4 (four) hours as needed for wheezing or shortness of breath.) 8.5 g 2   allopurinol (ZYLOPRIM) 100 MG tablet TAKE 1 TABLET(100 MG) BY MOUTH DAILY (Patient taking differently: Take 100 mg by mouth daily.) 90 tablet 3   aspirin EC 81 MG tablet Take 1 tablet (81 mg total) by mouth every evening.     atorvastatin (LIPITOR) 40 MG tablet TAKE 1 TABLET(40 MG) BY MOUTH DAILY AT 6 PM (Patient taking differently: Take 40 mg by mouth daily.) 90 tablet 1   busPIRone (BUSPAR) 5 MG tablet TAKE 1 TABLET(5 MG) BY MOUTH TWICE DAILY FOR ANXIETY (Patient taking differently: Take 5 mg by mouth 2 (two) times daily.) 60 tablet 0   furosemide (LASIX) 40 MG tablet TAKE 1 TABLET(40 MG) BY MOUTH DAILY (Patient taking differently: Take 40 mg by mouth daily.) 90 tablet 3   HYDROcodone-acetaminophen (NORCO) 7.5-325 MG tablet Take 1 tablet by mouth 2 (two) times daily as needed for moderate pain. (Patient taking differently: Take 1 tablet by mouth in the morning and at bedtime.) 60 tablet 0   Magnesium 250 MG TABS Take 1 tablet (250 mg total)  by mouth daily. (Patient taking differently: Take 250 mg by mouth daily in the afternoon. Midday) 30  tablet 11   omeprazole (PRILOSEC) 40 MG capsule TAKE 1 CAPSULE(40 MG) BY MOUTH DAILY (Patient taking differently: Take 40 mg by mouth daily.) 90 capsule 3   OXYGEN Inhale 1 L into the lungs at bedtime. IN ADDITION TO CPAP NIGHTLY     polyethylene glycol powder (MIRALAX) 17 GM/SCOOP powder Mix 1 scoop (17 grams) of powder in water daily to help with constipation. (Patient taking differently: Take 17 g by mouth daily as needed for mild constipation or moderate constipation.) 255 g 0   potassium chloride (KLOR-CON) 10 MEQ tablet TAKE 1 TABLET BY MOUTH DAILY WITH FLUID PILL (Patient taking differently: Take 10 mEq by mouth daily. With fluid pill) 90 tablet 3   pyridostigmine (MESTINON) 60 MG tablet Take 1 tablet (60 mg total) by mouth 3 (three) times daily. 270 tablet 4   traZODone (DESYREL) 100 MG tablet TAKE 1 TABLET BY MOUTH AT BEDTIME AS NEEDED FOR SLEEP (Patient taking differently: Take 100 mg by mouth at bedtime as needed for sleep.) 90 tablet 2   ciprofloxacin (CIPRO) 500 MG tablet Take 500 mg by mouth 2 (two) times daily. 7 day course starting on 02/02/2021     clotrimazole (LOTRIMIN) 1 % cream Apply 1 application topically 2 (two) times daily. (Patient not taking: Reported on 02/11/2021) 30 g 1   diphenhydrAMINE (BENADRYL) 25 MG tablet Take 25 mg by mouth daily.     Glucose Blood (BLOOD GLUCOSE TEST STRIPS) STRP Use as directed to monitor FSBS 3x weekly. Dx: R73.09. (Patient not taking: Reported on 02/11/2021) 50 strip 11   metFORMIN (GLUCOPHAGE) 500 MG tablet Take 1 tablet (500 mg total) by mouth 2 (two) times daily with a meal. (Patient not taking: Reported on 02/11/2021) 60 tablet 3   nystatin (MYCOSTATIN/NYSTOP) powder Apply 1 application topically 3 (three) times daily. (Patient not taking: No sig reported) 60 g 1   predniSONE (DELTASONE) 10 MG tablet Take 2 tablets (20 mg total) by mouth daily. (Patient not taking: No sig reported) 180 tablet 4    ROS:                                                                                                                                        Unable to be obtained due to encephalopathy.  General Examination:  Blood pressure 136/80, pulse 76, temperature 98 F (36.7 C), temperature source Oral, resp. rate 15, height 5\' 5"  (1.651 m), weight 106.4 kg, SpO2 100 %.  Physical Exam  General: chronically ill-appearing female laying in bed HENT: no obvious sign of head trauma. Dry mucous membranes. Large circumference neck Eyes: no scleral icterus or conjunctival injection Cardiac: RRR Pulm: paradoxical abdominal breathing. Upper respiratory sounds appreciable.  GI: abd soft, non-distended.  Neurological Examination Mental Status: Awakens to voice but intermittently falls back asleep. Oriented to person and place only. Not oriented to month or situation. Inconsistently follows commands.  Speech: dysarthric  Cranial Nerves: II: PERRL III,IV, VI: does not follow commands for EOM testing.  V,VII: no obvious facial droop VIII: hearing normal bilaterally IX,X, XI, XII: unable to assess Motor:  Difficult to assess as she is not following commands. Does not lift her arms up off the bed. Unable to hold them up independently. Weak hand grip strength bilaterally. Unable to lift her legs up off the bed. Moves toes to command bilaterally.  Tone and bulk:normal tone throughout; no atrophy noted No tremor, no asterixis.  Sensory: unable to assess Deep Tendon Reflexes: 2+ and symmetric throughout Cerebellar: Unable to assess Gait: unable to assess  Follow up attending exam: Ment: Able to follow simple commands after she is aroused from sleep. Answers some simple questions correctly. Bradyphrenic.  CN: No facial droop. Phonation intact.  Motor: 4/5 BUE without asymmetry. BLE strength testing compromised by post-op status.  Sensory: Reacts to touch x  4   Lab Results: Basic Metabolic Panel: Recent Labs  Lab 02/11/21 1432 02/12/21 0349 02/14/21 0124 02/15/21 0755 02/16/21 0959 02/17/21 0431  NA 134* 135 138 138 138 141  K 4.3 4.0 4.0 3.9 3.9 3.5  CL 104 108 110 110 114* 116*  CO2 22 20* 19* 21* 18* 22  GLUCOSE 98 93 82 77 95 93  BUN 16 15 14  24* 30* 31*  CREATININE 1.02* 0.96 1.18* 1.18*  1.14* 1.39* 1.25*  CALCIUM 8.2* 8.1* 8.2* 8.5* 8.3* 8.5*  MG 1.3* 1.9  --   --   --   --   PHOS  --   --   --  2.7  --   --     CBC: Recent Labs  Lab 02/11/21 1432 02/12/21 0349 02/14/21 0124 02/15/21 0755 02/15/21 1730 02/16/21 0959  WBC 10.9* 10.4 15.1* 28.4* 27.0* 19.0*  NEUTROABS 7.7  --   --   --  23.7*  --   HGB 10.0* 9.1* 8.6* 8.7* 8.9* 7.9*  HCT 29.6* 27.2* 25.4* 25.7* 25.3* 24.0*  MCV 93.1 94.1 93.0 92.1 91.0 94.5  PLT 378 347 302 312 323 261    Cardiac Enzymes: No results for input(s): CKTOTAL, CKMB, CKMBINDEX, TROPONINI in the last 168 hours.  Lipid Panel: No results for input(s): CHOL, TRIG, HDL, CHOLHDL, VLDL, LDLCALC in the last 168 hours.  Imaging: CT HEAD WO CONTRAST  Result Date: 02/15/2021 CLINICAL DATA:  Mental status change EXAM: CT HEAD WITHOUT CONTRAST TECHNIQUE: Contiguous axial images were obtained from the base of the skull through the vertex without intravenous contrast. COMPARISON:  MRI 02/24/2017 FINDINGS: Brain: No evidence of acute infarction, hemorrhage, hydrocephalus, extra-axial collection, visible mass lesion or mass effect. Symmetric prominence of the ventricles, cisterns and sulci compatible with parenchymal volume loss. Patchy areas of white matter hypoattenuation are most compatible with chronic microvascular angiopathy. Benign-appearing coarse dural calcifications. No conspicuous dural thickening. Partially empty appearance of the sella. Remaining midline  intracranial structures are unremarkable with normally positioned cerebellar tonsils. Basal cisterns are patent. Vascular: Atherosclerotic  calcification of the carotid siphons and left intradural vertebral artery. No hyperdense vessel. Skull: Hyperostotic changes of the skull, typically benign incidental and often senescent finding. No calvarial fracture or suspicious osseous lesion. No scalp swelling or hematoma. Sinuses/Orbits: Paranasal sinuses and mastoid air cells are predominantly clear. Orbital structures are unremarkable aside from prior lens extractions. Other: Numerous chronically absent dentition and carious lesions with few visible periapical lucencies. IMPRESSION: No acute intracranial abnormality. Background of parenchymal volume loss, microvascular angiopathy and intracranial atherosclerosis. Partially empty appearance of the sella, nonspecific finding particularly in elderly patients. Numerous chronically absent dentition and multiple carious lesions with periapical lucency. Correlate with outpatient dental exam. Electronically Signed   By: Lovena Le M.D.   On: 02/15/2021 23:30   MR BRAIN WO CONTRAST  Result Date: 02/16/2021 CLINICAL DATA:  Mental status change, unknown cause EXAM: MRI HEAD WITHOUT CONTRAST TECHNIQUE: Multiplanar, multiecho pulse sequences of the brain and surrounding structures were obtained without intravenous contrast. COMPARISON:  August 2018 FINDINGS: Brain: There is no acute infarction or intracranial hemorrhage. There is no intracranial mass, mass effect, or edema. There is no hydrocephalus or extra-axial fluid collection. A few small foci of T2 hyperintensity in the supratentorial white matter are nonspecific but may reflect minor chronic microvascular ischemic changes. Ventricles and sulci are within normal limits in size and configuration. Vascular: Major vessel flow voids at the skull base are preserved. Skull and upper cervical spine: Normal marrow signal is preserved. Sinuses/Orbits: Paranasal sinuses are aerated. Bilateral lens replacements. Other: Sella is unremarkable.  Mastoid air cells are  clear. IMPRESSION: No evidence of recent infarction, hemorrhage, or mass Electronically Signed   By: Macy Mis M.D.   On: 02/16/2021 16:45   PERIPHERAL VASCULAR CATHETERIZATION  Result Date: 02/15/2021 Images from the original result were not included. Patient name: [name] MRN: [id number] DOB: Jul 10, 1951 Sex: female 02/15/2021 Pre-operative Diagnosis: chronic limb threatening ischemia left lower extremity Post-operative diagnosis:  Same Surgeon:  Erlene Quan C. Donzetta Matters, MD Procedure Performed: 1.  Ultrasound-guided cannulation right common femoral artery 2.  Aortogram with bilateral lower extremity runoff 3.  Drug-coated balloon angioplasty left tibioperoneal trunk with 4 mm Ranger 4.  Stent of left SFA with 6 x 80 mm Elluvia 5.  Moderate sedation with fentanyl and Versed for 46 minutes 6.  Mynx device closure right common femoral artery Indications: 70 year old female admitted with ulceration of her left heel.  She is indicated for angiography with possible intervention. Findings: The aorta is patent as were the bilateral common iliac arteries.  There does appear to be mild flow-limiting stenosis of the left hypogastric artery.  The bilateral renal arteries are patent.  Right lower extremity was incompletely evaluated there appears to be at least 50% stenosis of the mid SFA.  Below the knee on the right peroneal artery appears to be dominant runoff.  The left lower extremity is the site of interest.  There is approximately at least 60% stenosis in the mid SFA after stenting with drug-eluting stent this is reduced to 0%.  At the tibioperoneal trunk there is heavy disease.  I could not traverse into the posterior tibial artery which is diminutive throughout its course appears to frankly occlude distally.  The peroneal artery is the dominant runoff.  I did balloon angioplasty of the tibioperoneal trunk where there was at least 50% stenosis is reduced to less than 20% and there is improved  flow in the  peroneal artery.  The anterior tibial artery is also patent although there is proximal disease and the flow was disturbed.  Given the patient's noncompliance during the procedure it was difficult to obtain adequate pictures at completion she did have improved flow with a very strong peroneal signal at the ankle.  Procedure:  The patient was identified in the holding area and taken to room 8.  The patient was then placed supine on the table and prepped and draped in the usual sterile fashion.  A time out was called.  Ultrasound was used to evaluate the right common femoral artery.  This was somewhat diseased particularly posteriorly.  There is anesthetized 1% lidocaine cannulated micropuncture needle followed by wire and sheath.  The wire did traverse easily.  This was done under ultrasound guidance and images saved the permanent record.  A Bentson wire was then placed and we exchanged for a 5 French sheath.  We then placed an Omni catheter up to the level of L1 performed aortogram followed bilateral extremity runoff.  We then crossed the bifurcation performed left lower extremity angiography.  With the above findings we plan to intervene.  We placed initially attempts at 6 French sheath was then placed a 7 [place] dilator.  We ultimately exchanged for a Rosen wire and placed a long 6 French sheath in the left SFA patient was fully heparinized.  We crossed first the SFA under fluoroscopic guidance with V 18 wire and quick cross catheter.  At the TP trunk bifurcation and posterior tibial artery we attempted to use V 18 and quick cross select catheter but could not direct into the posterior tibial artery.  Given the patient's noncompliance I elected not to cannulate the posterior tibial artery retrograde.  We then primarily ballooned dilated the tibioperoneal trunk with 3 mm balloon.  This was then dilated to 4 mm drug-coated balloon angioplasty at nominal pressure for 4 minutes.  Completion demonstrated no residual  dissection stenosis was less than 20%.  Satisfied with this we turned our attention towards the SFA.  This was primarily stented with a 6 mm drug-eluting stent and postdilated with 5 mm balloon.  Completion of demonstrated minimal residual stenosis.  We then exchanged for a short 6 French sheath perform retrograde angiography.  A minx device was deployed.  She tolerated procedure without any complication. Contrast: 150 cc Brandon C. Donzetta Matters, MD [facility] Office: [phone] Pager: [phone]   DG CHEST PORT 1 VIEW  Result Date: 02/15/2021 CLINICAL DATA:  Preoperative EXAM: PORTABLE CHEST 1 VIEW COMPARISON:  11/21/2020 Cardiomegaly. Unchanged elevation of the left hemidiaphragm. No acute appearing airspace opacity. The visualized skeletal structures are unremarkable. IMPRESSION: No acute abnormality of the lungs in frontal projection. Electronically Signed   By: Eddie Candle M.D.   On: 02/15/2021 15:47   EEG adult  Result Date: 02/17/2021 Lora Havens, MD     02/17/2021  9:23 AM Patient Name: [name] MRN: [id number] Epilepsy Attending: Lora Havens Referring Physician/Provider: Dr Hosie Poisson Date: 02/16/2021 Duration: 25.30 mins Patient history: 70 year old female with altered mental status.  EEG to evaluate for seizures. Level of alertness: Awake AEDs during EEG study: None Technical aspects: This EEG study was done with scalp electrodes positioned according to the 10-20 International system of electrode placement. Electrical activity was acquired at a sampling rate of $Remov'500Hz'XXIKEX$  and reviewed with a high frequency filter of $RemoveB'70Hz'HqsqLxyH$  and a low frequency filter of $RemoveB'1Hz'xAKRbjma$ . EEG data  were recorded continuously and digitally stored. Description: No posterior dominant rhythm was seen.  EEG showed continuous generalized 3 to 5 Hz theta-delta slowing. Generalized periodic discharges with triphasic morphology at 1.5 to 2 Hz were also noted.  Hyperventilation and photic  stimulation were not performed.   ABNORMALITY - Periodic discharges with triphasic morphology, generalized ( GPDs) - Continuous slow, generalized IMPRESSION: This study showed generalized periodic discharges with triphasic morphology at 1.5 to 2 Hz which is on the ictal-interictal continuum.  However given the morphology and frequency, is more likely due to underlying toxic-metabolic causes.  Additionally there is evidence of moderate diffuse encephalopathy, nonspecific etiology but likely due to toxic metabolic causes.  No seizures were seen throughout the recording. Priyanka O Yadav   VAS [place] LOWER EXTREMITY VENOUS (DVT)  Result Date: 02/15/2021  Lower Venous DVT Study Patient Name:  [name]  Date of Exam:   02/15/2021 Medical Rec #: [id number]          Accession #:    [id number] Date of Birth: 11-15-1950          Patient Gender: F Patient Age:   2Y Exam Location:  [facility] Procedure:      VAS [place] LOWER EXTREMITY VENOUS (DVT) Referring Phys: [id number] JEHANZEB MEMON --------------------------------------------------------------------------------  Indications: Swelling.  Comparison Study: No prior. Performing Technologist: Oda Cogan RDMS, RVT  Examination Guidelines: A complete evaluation includes B-mode imaging, spectral Doppler, color Doppler, and power Doppler as needed of all accessible portions of each vessel. Bilateral testing is considered an integral part of a complete examination. Limited examinations for reoccurring indications may be performed as noted. The reflux portion of the exam is performed with the patient in reverse Trendelenburg.  +---------+---------------+---------+-----------+----------+--------------+ RIGHT    CompressibilityPhasicitySpontaneityPropertiesThrombus Aging +---------+---------------+---------+-----------+----------+--------------+ CFV      Full           Yes      Yes                                  +---------+---------------+---------+-----------+----------+--------------+ SFJ      Full                                                        +---------+---------------+---------+-----------+----------+--------------+ FV Prox  Full                                                        +---------+---------------+---------+-----------+----------+--------------+ FV Mid   Full                                                        +---------+---------------+---------+-----------+----------+--------------+ FV DistalFull                                                        +---------+---------------+---------+-----------+----------+--------------+  PFV      Full                                                        +---------+---------------+---------+-----------+----------+--------------+ POP      Full           Yes      Yes                                 +---------+---------------+---------+-----------+----------+--------------+ PTV      Full                                                        +---------+---------------+---------+-----------+----------+--------------+ PERO     Full                                                        +---------+---------------+---------+-----------+----------+--------------+   +---------+---------------+---------+-----------+----------+--------------+ LEFT     CompressibilityPhasicitySpontaneityPropertiesThrombus Aging +---------+---------------+---------+-----------+----------+--------------+ CFV      Full           Yes      Yes                                 +---------+---------------+---------+-----------+----------+--------------+ SFJ      Full                                                        +---------+---------------+---------+-----------+----------+--------------+ FV Prox  Full                                                         +---------+---------------+---------+-----------+----------+--------------+ FV Mid   Full                                                        +---------+---------------+---------+-----------+----------+--------------+ FV DistalFull                                                        +---------+---------------+---------+-----------+----------+--------------+ PFV      Full                                                        +---------+---------------+---------+-----------+----------+--------------+  POP      Full           Yes      Yes                                 +---------+---------------+---------+-----------+----------+--------------+ PTV      Full                                                        +---------+---------------+---------+-----------+----------+--------------+ PERO     Full                                                        +---------+---------------+---------+-----------+----------+--------------+     Summary: BILATERAL: - No evidence of deep vein thrombosis seen in the lower extremities, bilaterally. -No evidence of popliteal cyst, bilaterally.   *See table(s) above for measurements and observations.    Preliminary    [place] EKG SITE RITE  Result Date: 02/15/2021 If Site Rite image not attached, placement could not be confirmed due to current cardiac rhythm.  [place] EKG SITE RITE  Result Date: 02/15/2021 If Site Rite image not attached, placement could not be confirmed due to current cardiac rhythm.  CT CHEST ABDOMEN PELVIS WO CONTRAST  Result Date: 02/15/2021 CLINICAL DATA:  70 year old female with altered mental status and sepsis. EXAM: CT CHEST, ABDOMEN AND PELVIS WITHOUT CONTRAST TECHNIQUE: Multidetector CT imaging of the chest, abdomen and pelvis was performed following the standard protocol without IV contrast. COMPARISON:  Chest CT dated 01/14/2021. FINDINGS: Evaluation of this exam is limited in the absence of intravenous  contrast. Evaluation is also limited due to streak artifact caused by patient's arms. CT CHEST FINDINGS Cardiovascular: There is no cardiomegaly or pericardial effusion. Mild atherosclerotic calcification of the thoracic aorta. No aneurysmal dilatation. The central pulmonary arteries are grossly unremarkable. Mediastinum/Nodes: No hilar or mediastinal adenopathy. The esophagus and thyroid gland are grossly unremarkable. No mediastinal fluid collection. Lungs/Pleura: Small bilateral pleural effusions. There is minimal compressive atelectasis of the lower lobes. Pneumonia is not excluded. Clinical correlation is recommended. No pneumothorax. The central airways are patent. Musculoskeletal: Osteopenia with degenerative changes of the spine. No acute osseous pathology. CT ABDOMEN PELVIS FINDINGS No intra-abdominal free air or free fluid. Hepatobiliary: Probable fatty liver. No intrahepatic biliary dilatation. Gallstones. No pericholecystic fluid. Pancreas: There is stranding and inflammatory changes surrounding the pancreas most consistent with acute pancreatitis. Correlation with pancreatic enzymes recommended. No drainable fluid collection/abscess or pseudocyst. Spleen: Normal in size without focal abnormality. Adrenals/Urinary Tract: The adrenal glands unremarkable. There is no hydronephrosis on either side. There is symmetric enhancement and excretion of contrast by both kidneys. Several subcentimeter right renal hypodense lesions are too small to characterize. The visualized ureters appear unremarkable. The urinary bladder is partially distended and contains excreted contrast from recent IV administration. Stomach/Bowel: There is no bowel obstruction or active inflammation. Loose stool noted throughout the colon. The appendix is normal. Vascular/Lymphatic: Advanced aortoiliac atherosclerotic disease. The IVC is unremarkable. No portal venous gas. There is no adenopathy. Reproductive: There is a 3 cm posterior  uterine body calcified fibroid. No adnexal  masses. Other: There is a 3 x 3 cm hematoma in the soft tissues of the right groin. Overlying compression is noted. Musculoskeletal: Osteopenia with degenerative changes of the spine. No acute osseous pathology. IMPRESSION: 1. Acute pancreatitis. No drainable fluid collection/abscess or pseudocyst. 2. A 3 cm hematoma in the right groin. 3. Cholelithiasis. 4. Small bilateral pleural effusions with minimal compressive atelectasis of the lower lobes. Pneumonia is not excluded. Clinical correlation is recommended. 5. Aortic Atherosclerosis (ICD10-I70.0). These results were called by telephone at the time of interpretation on 02/15/2021 at 11:31 pm to the patient's nurse, Netta Corrigan, who verbally acknowledged these results. Electronically Signed   By: Anner Crete M.D.   On: 02/15/2021 23:34     Assessment: 70 year old female with hypertension, OSA/OHS, anxiety and depression who was admitted due to a non-healing left foot wound complicated by purulent cellulitis. Angiography revealed arterial disease in the left lower extremity and she subsequently underwent stenting of the diseased lesions. Following the procedure, she developed encephalopathy. Initial workup was essentially unremarkable aside from an elevated ammonia level.  - Exam findings are consistent with diffuse cerebral hypofunction.  - EEG showed generalized periodic discharges with triphasic morphology at 1.5 to 2 Hz which is on the ictal-interictal continuum.  However given the morphology and frequency, is more likely due to underlying toxic-metabolic causes.  Additionally there is evidence of moderate diffuse encephalopathy, nonspecific etiology but likely due to toxic metabolic causes.  No seizures were seen throughout the recording.  - Suspect toxic/metabolic encephalopathy supported by exam (non-focal) and EEG findings.  - Primary respiratory acidosis with secondary metabolic acidosis noted on VBG.  Suspect the respiratory component (CO2 50) is attributable to not using the CPAP over the past few days. Unsure about the metabolic component. Possibly related to her renal function vs saline infusion she had received over the prior days vs RTA 4. - Elevated ammonia level. AST, ALT, alk phos, and bili were normal on admission. Platelets, INR and sodium are normal and does not suggest impaired hepatic synthetic function. No recent abdominal imaging to evaluate liver appearance. Abdominal CT findings from 2015 were consistent with hepatic steatosis. Started on lactulose 7/26 - No significant electrolyte derangements.  - B12 and TSH are normal.  - Bacteriuria on UA--she is already receiving treatment with cefepime >> rocephin. - Possibly related to cefepime neurotoxicity. Switched to rocephin 7/26. She is improved today relative to yesterday - Has not recieved narcotics since so unlikely playing a role - I do not see any major drug interactions between the pyridostigmine and anything she is receiving - Chronic microvascular disease as noted on head imaging may result in decreased neurological reserve and increased susceptibility to metabolic/toxic insults.  - Imaging shows no evidence of acute ischemia or bleed and exam is non-focal.  Recommendations: - Recommend ensuring that CPAP is being used at night - Limit use of centrally acting agents - Delirium precautions   Mitzi Hansen, MD Internal Medicine Resident PGY-3 [facility] Pager: [phone] 02/17/2021 11:22 AM     I have seen and examined the patient. I have formulated the assessment and recommendations. 70 year old female with hypertension, OSA/OHS, anxiety and depression who was admitted due to a non-healing left foot wound complicated by purulent cellulitis. Angiography revealed arterial disease in the left lower extremity and she subsequently underwent stenting of the diseased lesions. Following the  procedure, she developed encephalopathy. Initial workup was essentially unremarkable aside from an elevated ammonia level. DDx and  recommendations as above.  Electronically signed: Dr. Kerney Elbe

## 2021-02-17 NOTE — TOC Initial Note (Signed)
Transition of Care [facility]) - Initial/Assessment Note    Patient Details  Name: [name] MRN: [id number] Date of Birth: [birth year]  Transition of Care [facility]) CM/SW Contact:    Vinie Sill, LCSW Phone Number: 02/17/2021, 3:25 PM  Clinical Narrative:                  CSW spoke with patient's son, Beverely Low - he requested to talk with his wife Tamika. CSW introduced self and explain role. She states patient lives home alone and family is unable to provide the level of care needed at this time. Family is agreeable to short term rehab at [facility]. CSW explained the SNF process. No preferred SNF at this time but wanted SNF search in [place]. CSW answered all questions.   CSW will provide bed offers once available.  CSW will continue to follow and assist with discharge planning.  Thurmond Butts, MSW, LCSW Clinical Social Worker    Expected Discharge Plan: Skilled Nursing Facility Barriers to Discharge: Insurance Authorization, Continued Medical Work up, SNF Pending bed offer   Patient Goals and CMS Choice Patient states their goals for this hospitalization and ongoing recovery are:: Return home CMS Medicare.gov Compare Post Acute Care list provided to:: Patient Choice offered to / list presented to : Patient  Expected Discharge Plan and Services Expected Discharge Plan: [place] In-house Referral: Clinical Social Work Discharge Planning Services: CM Consult   Living arrangements for the past 2 months: [place]                                      Prior Living Arrangements/Services Living arrangements for the past 2 months: Single Family Home Lives with:: Self Patient language and need for interpreter reviewed:: No Do you feel safe going back to the place where you live?: Yes      Need for Family Participation in Patient Care: Yes (Comment) Care giver support system in place?: Yes (comment) Current home services: DME,  Safety alert (cane, walker, life alert, shower seat, O2) Criminal Activity/Legal Involvement Pertinent to Current Situation/Hospitalization: No - Comment as needed  Activities of Daily Living Home Assistive Devices/Equipment: Cane (specify quad or straight), Walker (specify type) ADL Screening (condition at time of admission) Patient's cognitive ability adequate to safely complete daily activities?: Yes Is the patient deaf or have difficulty hearing?: No Does the patient have difficulty seeing, even when wearing glasses/contacts?: No Does the patient have difficulty concentrating, remembering, or making decisions?: No Patient able to express need for assistance with ADLs?: Yes Does the patient have difficulty dressing or bathing?: No Independently performs ADLs?: Yes (appropriate for developmental age) Does the patient have difficulty walking or climbing stairs?: No Weakness of Legs: Both Weakness of Arms/Hands: None  Permission Sought/Granted Permission sought to share information with : Family Supports                Emotional Assessment Appearance:: Appears stated age Attitude/Demeanor/Rapport: Unable to Assess Affect (typically observed): Unable to Assess Orientation: : Oriented to Self Alcohol / Substance Use: Not Applicable Psych Involvement: No (comment)  Admission diagnosis:  Osteomyelitis ([place]) [M86.9] Open wound of plantar aspect of foot, left, initial encounter [S91.302A] Patient Active Problem List   Diagnosis Date Noted   Hypomagnesemia 02/12/2021   Social problem 02/12/2021   DM2 (diabetes mellitus, type 2) ([place]) 02/12/2021   Open wound of plantar aspect of  foot    Osteomyelitis ([place]) 02/11/2021   Abscess of right lower extremity excluding foot 10/18/2020   Abscess of right leg 10/18/2020   Dehydration 10/03/2020   Leukocytosis 10/03/2020   Thrombocytosis 10/03/2020   Hyponatremia 10/03/2020   Hypochloremia 10/03/2020   Failure to thrive in adult  10/03/2020   Acute renal failure superimposed on stage 3b chronic kidney disease ([place]) 10/03/2020   Uncontrolled type 2 diabetes mellitus with hyperglycemia, without long-term current use of insulin ([place]) 10/03/2020   Acute kidney injury superimposed on CKD ([place]) 10/02/2020   Chronic kidney disease 10/01/2020   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   Chronic insomnia 01/08/2020   Ocular myasthenia gravis ([place]) 09/18/2019   Peripheral edema 01/23/2019   Asthma 05/14/2015   Heel spur 02/24/2015   Depression 02/24/2015   Post-menopausal bleeding 06/02/2014   OA (osteoarthritis) of knee 04/09/2014   Epidermal cyst 10/01/2013   Class 3 obesity 02/25/2013   Back pain 03/04/2012   Gout 01/25/2012   Edema 01/28/2011   Obesity hypoventilation syndrome ([place]) 06/16/2008   OBSTRUCTIVE SLEEP APNEA 05/08/2008   HLD (hyperlipidemia) 08/28/2006   Anxiety 08/28/2006   Essential hypertension 08/28/2006   GERD 08/28/2006   Osteoarthritis 08/28/2006   PCP:  Leslie Andrea, MD Pharmacy:   [facility] [phone] - [place], [place] - [address] AT [place]. [place] [address] Phone: [phone] Fax: [phone]     Social Determinants of Health (SDOH) Interventions    Readmission Risk Interventions Readmission Risk Prevention Plan 02/12/2021 10/20/2020 10/04/2020  Transportation Screening Complete Complete Complete  PCP or Specialist Appt within 5-7 Days - - Complete  Home Care Screening - - Complete  Medication Review (RN CM) - - Complete  HRI or Home Care Consult - Complete -  Social Work Consult for Recovery Care Planning/Counseling - Complete -  Palliative Care Screening - Not Applicable -  Medication Review Press photographer) Complete Complete -  [place] or Home Care Consult Complete - -  SW Recovery Care/Counseling Consult Complete - -  Palliative Care Screening Not Applicable - -  [place] Not  Applicable - -  Some recent data might be hidden

## 2021-02-17 NOTE — Progress Notes (Signed)
Occupational Therapy Treatment Patient Details Name: [name] MRN: [id number] DOB: 09/07/1950 Today's Date: 02/17/2021    History of present illness 70 yo admitted 7/21 to [facility] with left heel ulcer and ischemia. Transfer to [facility] 7/22. 7/25 angiography with SFA stent and Left tibioperoneal angioplasty. Pt with AMS post procedure with head CT negative.  PMhx: GERD, anxiety/depression, HTN, HLD, OSA, gout, asthma   OT comments  Pt not making any progress towards OT goals this session, due to increased lethargy and cognitive deficits. Pt requires total A +2 for all bed mobility and bathing and toileting. Pt only able to respond to few simple commands, such as "squeeze your hands". Additionally, pt is minimally verbally responsive, only moaning as a pain response or repeating "ok,ok". OT will continue to follow acutely.    Follow Up Recommendations  SNF;Supervision/Assistance - 24 hour    Equipment Recommendations  Other (comment) (TBD)    Recommendations for Other Services      Precautions / Restrictions Precautions Precautions: Fall Restrictions Weight Bearing Restrictions: No       Mobility Bed Mobility Overal bed mobility: Needs Assistance Bed Mobility: Rolling Rolling: Total assist;+2 for physical assistance         General bed mobility comments: total +2 assist to roll bil with pt able to maintain sidelying with grasp of rail once in sidelying. Pt did not assist with rolling left. Total +2 to slide toward [facility]    Transfers                 General transfer comment: unable    Balance                                           ADL either performed or assessed with clinical judgement   ADL Overall ADL's : Needs assistance/impaired                                       General ADL Comments: overall Total A for all ADLs bed level at this time due to cognition/lethargy.     Vision       Perception     Praxis       Cognition Arousal/Alertness: Lethargic Behavior During Therapy: Flat affect Overall Cognitive Status: Impaired/Different from baseline Area of Impairment: Orientation;Attention;Following commands;Awareness                 Orientation Level: Disoriented to;Place;Situation;Time Current Attention Level: Selective   Following Commands: Follows one step commands inconsistently   Awareness: Emergent   General Comments: Pt only responding to 2 commands "squeeze your hands" and "bend your leg" with tactile and verbal cueing. Pt handed a washcloth and guided to bring it to her face, resisted movement. Only stating "ok, ok" and "yea, yea, yea" Pt kept her eyes closed most of the session, only opening eyes with loud noises and tactile cueing.        Exercises     Shoulder Instructions       General Comments VSS on 2L, pt required total clean up from BM, with linens changed during session.    Pertinent Vitals/ Pain       Pain Assessment: Faces Faces Pain Scale: Hurts even more Pain Location: Generalized with rolling for clean up Pain Descriptors / Indicators: Grimacing;Guarding;Moaning Pain  Intervention(s): Limited activity within patient's tolerance;Monitored during session;Repositioned  Home Living                                          Prior Functioning/Environment              Frequency  Min 2X/week        Progress Toward Goals  OT Goals(current goals can now be found in the care plan section)  Progress towards OT goals: Not progressing toward goals - comment  Acute Rehab OT Goals Patient Stated Goal: unable to state OT Goal Formulation: Patient unable to participate in goal setting Time For Goal Achievement: 03/02/21 Potential to Achieve Goals: Fair ADL Goals Pt Will Perform Grooming: with min assist;sitting Pt Will Perform Upper Body Bathing: with mod assist;sitting Pt Will Perform Lower Body Bathing: with max assist;sit to/from  stand;sitting/lateral leans Additional ADL Goal #1: Pt to demo bed mobility with Min A in prep for ADL transfers  Plan Discharge plan remains appropriate;Frequency remains appropriate    Co-evaluation                 AM-PAC OT "6 Clicks" Daily Activity     Outcome Measure   Help from another person eating meals?: Total Help from another person taking care of personal grooming?: Total Help from another person toileting, which includes using toliet, bedpan, or urinal?: Total Help from another person bathing (including washing, rinsing, drying)?: Total Help from another person to put on and taking off regular upper body clothing?: Total Help from another person to put on and taking off regular lower body clothing?: Total 6 Click Score: 6    End of Session Equipment Utilized During Treatment: Oxygen  OT Visit Diagnosis: Unsteadiness on feet (R26.81);Other abnormalities of gait and mobility (R26.89);Muscle weakness (generalized) (M62.81);Other symptoms and signs involving cognitive function   Activity Tolerance Patient limited by lethargy;Other (comment) (Limited by cognition)   Patient Left in bed;with call bell/phone within reach;with nursing/sitter in room   Nurse Communication Mobility status;Other (comment) (Needing help with clean up)        Time: 2707-8675 OT Time Calculation (min): 50 min  Charges: OT General Charges $OT Visit: 1 Visit OT Treatments $Self Care/Home Management : 38-52 mins  Silvio Sausedo H., OTR/L Acute Rehabilitation   Jemario Poitras Elane Abby Tucholski 02/17/2021, 10:50 AM

## 2021-02-17 NOTE — Progress Notes (Signed)
Mobility Specialist: Progress Note   02/17/21 1514  Mobility  Activity Turned to right side;Turned to left side  Level of Assistance Maximum assist, patient does 25-49%  Assistive Device None  Mobility Sit up in bed/chair position for meals  Mobility Response Tolerated fair  Mobility performed by Mobility specialist;Nurse tech  $Mobility charge 1 Mobility   Post-Mobility: 91 HR  Pt assisted with pericare from NT and myself d/t bowel incontinence. Pt max assist to turn to her R and L side. Pt was able to follow commands for hand placement on bed rails but required physical assistance to move her arms and turn to each side. NT present in the room.   [facility] Adonte Vanriper Mobility Specialist Mobility Specialist Phone: [phone]

## 2021-02-17 NOTE — Progress Notes (Signed)
PROGRESS NOTE    [name]  GQQ:[id number] DOB: 03-17-1951 DOA: 02/11/2021 PCP: Leslie Andrea, MD    Chief Complaint  Patient presents with   Leg Pain    Brief Narrative:  70 y/o female sent to ED from wound care center for worsening of L foot wound. There was concern for underlying osteomyelitis since per outpatient provider, wound could be probed to the bone. MRI foot did not show any osteomyelitis or abscess. ABI were 0.75, but likely falsely elevated. Since here wound was not healing despite wound care and antibiotics, she as seen by vascular and underwent aortogram with bilateral lower extremity runoff, s/p balloon angioplasty left tibioperoneal trunk and placement of a stent of left SFA. Overnight patient became encephalopathic, confused.  Initial CT of the head is negative for acute stroke.  MRI of the brain without contrast ordered for further evaluation. Assessment & Plan:   Active Problems:   HLD (hyperlipidemia)   OBSTRUCTIVE SLEEP APNEA   Ocular myasthenia gravis (HCC)   Hypomagnesemia   Social problem   Open wound of plantar aspect of foot   DM2 (diabetes mellitus, type 2) (HCC)   Nonhealing left heel wound and cellulitis On admission patient was found to have purulent discharge and foul-smelling from the left heel wound. She recently completed treatment with ciprofloxacin for Serratia isolated from the wound cultures. Repeat wound cultures are pending, rare gram positive cocci seen. Further identification and sensitivities are pending.  MRI of the foot does not show any osteomyelitis or abscess Vascular surgery consulted and underwent aortogram, balloon angioplasty and left SFA stent.     Acute encephalopathy of unclear etiology/ metabolic encephalopathy.  Differentials include lethargy from moderate sedation vs  encephalopathy from elevated ammonia levels. EEG does not show any acute abnormality, TSH wnl, vitamin b12 levels are wnl.  UA is slightly  abnormal , but pt is already on IV cefepime transitioned to IV rocephin. .  Ammonia levels elevated, added lactulose.  Initial CT head negative, follow up with MRI of the brain does not show any acute stroke.  Recheck ABG today.  Neurology consulted for further recommendations.   Type 2 diabetes mellitus A1c is 4.9 Continue with sliding scale insulin. CBG (last 3)  Recent Labs    02/17/21 0541 02/17/21 0846 02/17/21 1109  GLUCAP 93 94 110*      Lymphedema   Mild AKI With metabolic acidosis:  Suspect from dehydration, hypotension.  Gently hydrate and repeat renal parameters in am.    Obstructive sleep apnea Continue with CPAP   Ocular myasthenia gravis Patient on pyridostigmine  Hyperlipidemia Continue with statin   Deconditioning and generalized weakness:  Therapy evaluations recommending SNF.    DVT prophylaxis: Heparin Code Status: Full code Family Communication: (None at bedside Disposition:   Status is: Inpatient  Remains inpatient appropriate because:Ongoing diagnostic testing needed not appropriate for outpatient work up, Unsafe d/c plan, and IV treatments appropriate due to intensity of illness or inability to take PO  Dispo: The patient is from: Home              Anticipated d/c is to: SNF              Patient currently is not medically stable to d/c.   Difficult to place patient No       Consultants:  Vascular surgery  Procedures: MRI of the brain Aortogram Antimicrobials:  Antibiotics Given (last 72 hours)     Date/Time Action Medication Dose Rate  02/14/21 1716 New Bag/Given   vancomycin (VANCOREADY) IVPB 1500 mg/300 mL 1,500 mg 150 mL/hr   02/14/21 2136 New Bag/Given   ceFEPIme (MAXIPIME) 2 g in sodium chloride 0.9 % 100 mL IVPB 2 g 200 mL/hr   02/15/21 0915 New Bag/Given   ceFEPIme (MAXIPIME) 2 g in sodium chloride 0.9 % 100 mL IVPB 2 g 200 mL/hr   02/15/21 2311 New Bag/Given   ceFEPIme (MAXIPIME) 2 g in sodium chloride 0.9 %  100 mL IVPB 2 g 200 mL/hr   02/16/21 0840 New Bag/Given   ceFEPIme (MAXIPIME) 2 g in sodium chloride 0.9 % 100 mL IVPB 2 g 200 mL/hr   02/16/21 2132 New Bag/Given   cefTRIAXone (ROCEPHIN) 2 g in sodium chloride 0.9 % 100 mL IVPB 2 g 200 mL/hr         Subjective: Confused, delirious.   Objective: Vitals:   02/16/21 2310 02/17/21 0300 02/17/21 0700 02/17/21 1111  BP: (!) 108/50 115/69 136/80 (!) 127/59  Pulse: 84 74 76   Resp: 16 14 15 16   Temp: 97.9 F (36.6 C) 97.8 F (36.6 C) 98 F (36.7 C) 98.1 F (36.7 C)  TempSrc: Oral Oral Oral Oral  SpO2: 100% 95% 100%   Weight:  106.4 kg    Height:        Intake/Output Summary (Last 24 hours) at 02/17/2021 1333 Last data filed at 02/17/2021 0102 Gross per 24 hour  Intake 1268 ml  Output 650 ml  Net 618 ml    Filed Weights   02/11/21 1909 02/16/21 0353 02/17/21 0300  Weight: 108.4 kg 105.9 kg 106.4 kg    Examination:  General exam: Elderly woman not in distress.  Respiratory system: air entry fair, no wheezing heard.  Cardiovascular system: RRR. No JVD, trace pedal edema Gastrointestinal system: Abdomen is soft, NT ND BS+ Central nervous system: PT still confused, able to move all extremities.  Extremities: leg edema. And left heel wound.  Skin: left heel wound.  Psychiatry:  cannot be assessed.     Data Reviewed: I have personally reviewed following labs and imaging studies  CBC: Recent Labs  Lab 02/11/21 1432 02/12/21 0349 02/14/21 0124 02/15/21 0755 02/15/21 1730 02/16/21 0959  WBC 10.9* 10.4 15.1* 28.4* 27.0* 19.0*  NEUTROABS 7.7  --   --   --  23.7*  --   HGB 10.0* 9.1* 8.6* 8.7* 8.9* 7.9*  HCT 29.6* 27.2* 25.4* 25.7* 25.3* 24.0*  MCV 93.1 94.1 93.0 92.1 91.0 94.5  PLT 378 347 302 312 323 261     Basic Metabolic Panel: Recent Labs  Lab 02/11/21 1432 02/12/21 0349 02/14/21 0124 02/15/21 0755 02/16/21 0959 02/17/21 0431  NA 134* 135 138 138 138 141  K 4.3 4.0 4.0 3.9 3.9 3.5  CL 104 108  110 110 114* 116*  CO2 22 20* 19* 21* 18* 22  GLUCOSE 98 93 82 77 95 93  BUN 16 15 14  24* 30* 31*  CREATININE 1.02* 0.96 1.18* 1.18*  1.14* 1.39* 1.25*  CALCIUM 8.2* 8.1* 8.2* 8.5* 8.3* 8.5*  MG 1.3* 1.9  --   --   --   --   PHOS  --   --   --  2.7  --   --      GFR: Estimated Creatinine Clearance: 50 mL/min (A) (by C-G formula based on SCr of 1.25 mg/dL (H)).  Liver Function Tests: Recent Labs  Lab 02/12/21 0349 02/15/21 0755  AST 27  --  ALT 17  --   ALKPHOS 74  --   BILITOT 0.6  --   PROT 6.0*  --   ALBUMIN 2.6* 1.9*     CBG: Recent Labs  Lab 02/16/21 1139 02/16/21 2109 02/17/21 0541 02/17/21 0846 02/17/21 1109  GLUCAP 113* 80 93 94 110*      Recent Results (from the past 240 hour(s))  Culture, blood (routine x 2)     Status: None   Collection Time: 02/11/21  2:37 PM   Specimen: Right Antecubital; Blood  Result Value Ref Range Status   Specimen Description   Final    RIGHT ANTECUBITAL BOTTLES DRAWN AEROBIC AND ANAEROBIC   Special Requests Blood Culture adequate volume  Final   Culture   Final    NO GROWTH 5 DAYS Performed at [facility], [address], [place], [place] [address]    Report Status 02/16/2021 FINAL  Final  Resp Panel by RT-PCR (Flu A&B, Covid) Nasopharyngeal Swab     Status: None   Collection Time: 02/11/21  3:31 PM   Specimen: Nasopharyngeal Swab; Nasopharyngeal(NP) swabs in vial transport medium  Result Value Ref Range Status   SARS Coronavirus 2 by RT PCR NEGATIVE NEGATIVE Final    Comment: (NOTE) SARS-CoV-2 target nucleic acids are NOT DETECTED.  The SARS-CoV-2 RNA is generally detectable in upper respiratory specimens during the acute phase of infection. The lowest concentration of SARS-CoV-2 viral copies this assay can detect is 138 copies/mL. A negative result does not preclude SARS-Cov-2 infection and should not be used as the sole basis for treatment or other patient management decisions. A negative result may occur with   improper specimen collection/handling, submission of specimen other than nasopharyngeal swab, presence of viral mutation(s) within the areas targeted by this assay, and inadequate number of viral copies(<138 copies/mL). A negative result must be combined with clinical observations, patient history, and epidemiological information. The expected result is Negative.  Fact Sheet for Patients:  EntrepreneurPulse.com.au  Fact Sheet for Healthcare Providers:  IncredibleEmployment.be  This test is no t yet approved or cleared by the [place] FDA and  has been authorized for detection and/or diagnosis of SARS-CoV-2 by FDA under an Emergency Use Authorization (EUA). This EUA will remain  in effect (meaning this test can be used) for the duration of the COVID-19 declaration under Section 564(b)(1) of the Act, 21 U.S.C.section 360bbb-3(b)(1), unless the authorization is terminated  or revoked sooner.       Influenza A by PCR NEGATIVE NEGATIVE Final   Influenza B by PCR NEGATIVE NEGATIVE Final    Comment: (NOTE) The Xpert Xpress SARS-CoV-2/FLU/RSV plus assay is intended as an aid in the diagnosis of influenza from Nasopharyngeal swab specimens and should not be used as a sole basis for treatment. Nasal washings and aspirates are unacceptable for Xpert Xpress SARS-CoV-2/FLU/RSV testing.  Fact Sheet for Patients: EntrepreneurPulse.com.au  Fact Sheet for Healthcare Providers: IncredibleEmployment.be  This test is not yet approved or cleared by the [place] FDA and has been authorized for detection and/or diagnosis of SARS-CoV-2 by FDA under an Emergency Use Authorization (EUA). This EUA will remain in effect (meaning this test can be used) for the duration of the COVID-19 declaration under Section 564(b)(1) of the Act, 21 U.S.C. section 360bbb-3(b)(1), unless the authorization is terminated  or revoked.  Performed at [facility], [address] [place], [place] [address]   Culture, blood (routine x 2)     Status: None   Collection Time: 02/11/21  3:54 PM   Specimen: Left Antecubital; Blood  Result Value Ref Range Status   Specimen Description   Final    LEFT ANTECUBITAL BOTTLES DRAWN AEROBIC AND ANAEROBIC   Special Requests Blood Culture adequate volume  Final   Culture   Final    NO GROWTH 5 DAYS Performed at [facility], [address] [place], [place] [address]    Report Status 02/16/2021 FINAL  Final  Aerobic Culture w Gram Stain (superficial specimen)     Status: None (Preliminary result)   Collection Time: 02/13/21 12:13 PM   Specimen: Wound  Result Value Ref Range Status   Specimen Description WOUND  Final   Special Requests FOOT LEFT  Final   Gram Stain   Final    NO WBC SEEN MODERATE GRAM VARIABLE ROD RARE GRAM POSITIVE COCCI IN PAIRS    Culture   Final    CULTURE REINCUBATED FOR BETTER GROWTH Performed at [facility], [place] [address], [place], [place] [address]    Report Status PENDING  Incomplete  Surgical pcr screen     Status: None   Collection Time: 02/14/21 10:06 PM   Specimen: Nasal Mucosa; Nasal Swab  Result Value Ref Range Status   MRSA, PCR NEGATIVE NEGATIVE Final   Staphylococcus aureus NEGATIVE NEGATIVE Final    Comment: (NOTE) The Xpert SA Assay (FDA approved for NASAL specimens in patients 43 years of age and older), is one component of a comprehensive surveillance program. It is not intended to diagnose infection nor to guide or monitor treatment. Performed at [facility], [place] [address] [place], [place] [address]   Urine Culture     Status: None (Preliminary result)   Collection Time: 02/15/21  2:04 AM   Specimen: Urine, Catheterized  Result Value Ref Range Status   Specimen Description URINE, CATHETERIZED  Final   Special Requests NONE  Final   Culture   Final    CULTURE REINCUBATED FOR BETTER  GROWTH Performed at [facility], [place] [address] [place], [place] [address]    Report Status PENDING  Incomplete  Culture, blood (routine x 2)     Status: None (Preliminary result)   Collection Time: 02/15/21 12:49 PM   Specimen: BLOOD  Result Value Ref Range Status   Specimen Description BLOOD RIGHT ANTECUBITAL  Final   Special Requests   Final    BOTTLES DRAWN AEROBIC ONLY Blood Culture adequate volume   Culture   Final    NO GROWTH 2 DAYS Performed at [facility], [place] [address], [place], [place] [address]    Report Status PENDING  Incomplete  Culture, blood (routine x 2)     Status: None (Preliminary result)   Collection Time: 02/15/21 12:58 PM   Specimen: BLOOD LEFT HAND  Result Value Ref Range Status   Specimen Description BLOOD LEFT HAND  Final   Special Requests   Final    BOTTLES DRAWN AEROBIC ONLY Blood Culture results may not be optimal due to an inadequate volume of blood received in culture bottles   Culture   Final    NO GROWTH 2 DAYS Performed at [facility], [place] [address] [place], [place] [address]    Report Status PENDING  Incomplete          Radiology Studies: CT HEAD WO CONTRAST  Result Date: 02/15/2021 CLINICAL DATA:  Mental status change EXAM: CT HEAD WITHOUT CONTRAST TECHNIQUE: Contiguous axial images were obtained from  the base of the skull through the vertex without intravenous contrast. COMPARISON:  MRI 02/24/2017 FINDINGS: Brain: No evidence of acute infarction, hemorrhage, hydrocephalus, extra-axial collection, visible mass lesion or mass effect. Symmetric prominence of the ventricles, cisterns and sulci compatible with parenchymal volume loss. Patchy areas of white matter hypoattenuation are most compatible with chronic microvascular angiopathy. Benign-appearing coarse dural calcifications. No conspicuous dural thickening. Partially empty appearance of the sella. Remaining midline intracranial structures are unremarkable with  normally positioned cerebellar tonsils. Basal cisterns are patent. Vascular: Atherosclerotic calcification of the carotid siphons and left intradural vertebral artery. No hyperdense vessel. Skull: Hyperostotic changes of the skull, typically benign incidental and often senescent finding. No calvarial fracture or suspicious osseous lesion. No scalp swelling or hematoma. Sinuses/Orbits: Paranasal sinuses and mastoid air cells are predominantly clear. Orbital structures are unremarkable aside from prior lens extractions. Other: Numerous chronically absent dentition and carious lesions with few visible periapical lucencies. IMPRESSION: No acute intracranial abnormality. Background of parenchymal volume loss, microvascular angiopathy and intracranial atherosclerosis. Partially empty appearance of the sella, nonspecific finding particularly in elderly patients. Numerous chronically absent dentition and multiple carious lesions with periapical lucency. Correlate with outpatient dental exam. Electronically Signed   By: Lovena Le M.D.   On: 02/15/2021 23:30   MR BRAIN WO CONTRAST  Result Date: 02/16/2021 CLINICAL DATA:  Mental status change, unknown cause EXAM: MRI HEAD WITHOUT CONTRAST TECHNIQUE: Multiplanar, multiecho pulse sequences of the brain and surrounding structures were obtained without intravenous contrast. COMPARISON:  August 2018 FINDINGS: Brain: There is no acute infarction or intracranial hemorrhage. There is no intracranial mass, mass effect, or edema. There is no hydrocephalus or extra-axial fluid collection. A few small foci of T2 hyperintensity in the supratentorial white matter are nonspecific but may reflect minor chronic microvascular ischemic changes. Ventricles and sulci are within normal limits in size and configuration. Vascular: Major vessel flow voids at the skull base are preserved. Skull and upper cervical spine: Normal marrow signal is preserved. Sinuses/Orbits: Paranasal sinuses are  aerated. Bilateral lens replacements. Other: Sella is unremarkable.  Mastoid air cells are clear. IMPRESSION: No evidence of recent infarction, hemorrhage, or mass Electronically Signed   By: Macy Mis M.D.   On: 02/16/2021 16:45   PERIPHERAL VASCULAR CATHETERIZATION  Result Date: 02/15/2021 Images from the original result were not included. Patient name: [name] MRN: [id number] DOB: 06-Mar-1951 Sex: female 02/15/2021 Pre-operative Diagnosis: chronic limb threatening ischemia left lower extremity Post-operative diagnosis:  Same Surgeon:  Erlene Quan C. Donzetta Matters, MD Procedure Performed: 1.  Ultrasound-guided cannulation right common femoral artery 2.  Aortogram with bilateral lower extremity runoff 3.  Drug-coated balloon angioplasty left tibioperoneal trunk with 4 mm Ranger 4.  Stent of left SFA with 6 x 80 mm Elluvia 5.  Moderate sedation with fentanyl and Versed for 46 minutes 6.  Mynx device closure right common femoral artery Indications: 70 year old female admitted with ulceration of her left heel.  She is indicated for angiography with possible intervention. Findings: The aorta is patent as were the bilateral common iliac arteries.  There does appear to be mild flow-limiting stenosis of the left hypogastric artery.  The bilateral renal arteries are patent.  Right lower extremity was incompletely evaluated there appears to be at least 50% stenosis of the mid SFA.  Below the knee on the right peroneal artery appears to be dominant runoff.  The left lower extremity is the site of interest.  There is approximately at least 60% stenosis in the mid SFA  after stenting with drug-eluting stent this is reduced to 0%.  At the tibioperoneal trunk there is heavy disease.  I could not traverse into the posterior tibial artery which is diminutive throughout its course appears to frankly occlude distally.  The peroneal artery is the dominant runoff.  I did balloon angioplasty of the tibioperoneal trunk where there was  at least 50% stenosis is reduced to less than 20% and there is improved flow in the peroneal artery.  The anterior tibial artery is also patent although there is proximal disease and the flow was disturbed.  Given the patient's noncompliance during the procedure it was difficult to obtain adequate pictures at completion she did have improved flow with a very strong peroneal signal at the ankle.  Procedure:  The patient was identified in the holding area and taken to room 8.  The patient was then placed supine on the table and prepped and draped in the usual sterile fashion.  A time out was called.  Ultrasound was used to evaluate the right common femoral artery.  This was somewhat diseased particularly posteriorly.  There is anesthetized 1% lidocaine cannulated micropuncture needle followed by wire and sheath.  The wire did traverse easily.  This was done under ultrasound guidance and images saved the permanent record.  A Bentson wire was then placed and we exchanged for a 5 French sheath.  We then placed an Omni catheter up to the level of L1 performed aortogram followed bilateral extremity runoff.  We then crossed the bifurcation performed left lower extremity angiography.  With the above findings we plan to intervene.  We placed initially attempts at 6 French sheath was then placed a 7 [place] dilator.  We ultimately exchanged for a Rosen wire and placed a long 6 French sheath in the left SFA patient was fully heparinized.  We crossed first the SFA under fluoroscopic guidance with V 18 wire and quick cross catheter.  At the TP trunk bifurcation and posterior tibial artery we attempted to use V 18 and quick cross select catheter but could not direct into the posterior tibial artery.  Given the patient's noncompliance I elected not to cannulate the posterior tibial artery retrograde.  We then primarily ballooned dilated the tibioperoneal trunk with 3 mm balloon.  This was then dilated to 4 mm drug-coated balloon  angioplasty at nominal pressure for 4 minutes.  Completion demonstrated no residual dissection stenosis was less than 20%.  Satisfied with this we turned our attention towards the SFA.  This was primarily stented with a 6 mm drug-eluting stent and postdilated with 5 mm balloon.  Completion of demonstrated minimal residual stenosis.  We then exchanged for a short 6 French sheath perform retrograde angiography.  A minx device was deployed.  She tolerated procedure without any complication. Contrast: 150 cc Brandon C. Donzetta Matters, MD [facility] Office: [phone] Pager: [phone]   DG CHEST PORT 1 VIEW  Result Date: 02/15/2021 CLINICAL DATA:  Preoperative EXAM: PORTABLE CHEST 1 VIEW COMPARISON:  11/21/2020 Cardiomegaly. Unchanged elevation of the left hemidiaphragm. No acute appearing airspace opacity. The visualized skeletal structures are unremarkable. IMPRESSION: No acute abnormality of the lungs in frontal projection. Electronically Signed   By: Eddie Candle M.D.   On: 02/15/2021 15:47   EEG adult  Result Date: 02/17/2021 Lora Havens, MD     02/17/2021  9:23 AM Patient Name: [name] MRN: [id number] Epilepsy Attending: Lora Havens Referring Physician/Provider: Dr Hosie Poisson Date: 02/16/2021  Duration: 25.30 mins Patient history: 70 year old female with altered mental status.  EEG to evaluate for seizures. Level of alertness: Awake AEDs during EEG study: None Technical aspects: This EEG study was done with scalp electrodes positioned according to the 10-20 International system of electrode placement. Electrical activity was acquired at a sampling rate of 500Hz  and reviewed with a high frequency filter of 70Hz  and a low frequency filter of 1Hz . EEG data were recorded continuously and digitally stored. Description: No posterior dominant rhythm was seen.  EEG showed continuous generalized 3 to 5 Hz theta-delta slowing. Generalized periodic discharges with  triphasic morphology at 1.5 to 2 Hz were also noted.  Hyperventilation and photic stimulation were not performed.   ABNORMALITY - Periodic discharges with triphasic morphology, generalized ( GPDs) - Continuous slow, generalized IMPRESSION: This study showed generalized periodic discharges with triphasic morphology at 1.5 to 2 Hz which is on the ictal-interictal continuum.  However given the morphology and frequency, is more likely due to underlying toxic-metabolic causes.  Additionally there is evidence of moderate diffuse encephalopathy, nonspecific etiology but likely due to toxic metabolic causes.  No seizures were seen throughout the recording. Priyanka O Yadav   VAS [place] LOWER EXTREMITY VENOUS (DVT)  Result Date: 02/15/2021  Lower Venous DVT Study Patient Name:  [name]  Date of Exam:   02/15/2021 Medical Rec #: [id number]          Accession #:    [id number] Date of Birth: 23-Feb-1951          Patient Gender: F Patient Age:   89Y Exam Location:  [facility] Procedure:      VAS [place] LOWER EXTREMITY VENOUS (DVT) Referring Phys: [id number] JEHANZEB MEMON --------------------------------------------------------------------------------  Indications: Swelling.  Comparison Study: No prior. Performing Technologist: Oda Cogan RDMS, RVT  Examination Guidelines: A complete evaluation includes B-mode imaging, spectral Doppler, color Doppler, and power Doppler as needed of all accessible portions of each vessel. Bilateral testing is considered an integral part of a complete examination. Limited examinations for reoccurring indications may be performed as noted. The reflux portion of the exam is performed with the patient in reverse Trendelenburg.  +---------+---------------+---------+-----------+----------+--------------+ RIGHT    CompressibilityPhasicitySpontaneityPropertiesThrombus Aging +---------+---------------+---------+-----------+----------+--------------+ CFV      Full           Yes       Yes                                 +---------+---------------+---------+-----------+----------+--------------+ SFJ      Full                                                        +---------+---------------+---------+-----------+----------+--------------+ FV Prox  Full                                                        +---------+---------------+---------+-----------+----------+--------------+ FV Mid   Full                                                        +---------+---------------+---------+-----------+----------+--------------+  FV DistalFull                                                        +---------+---------------+---------+-----------+----------+--------------+ PFV      Full                                                        +---------+---------------+---------+-----------+----------+--------------+ POP      Full           Yes      Yes                                 +---------+---------------+---------+-----------+----------+--------------+ PTV      Full                                                        +---------+---------------+---------+-----------+----------+--------------+ PERO     Full                                                        +---------+---------------+---------+-----------+----------+--------------+   +---------+---------------+---------+-----------+----------+--------------+ LEFT     CompressibilityPhasicitySpontaneityPropertiesThrombus Aging +---------+---------------+---------+-----------+----------+--------------+ CFV      Full           Yes      Yes                                 +---------+---------------+---------+-----------+----------+--------------+ SFJ      Full                                                        +---------+---------------+---------+-----------+----------+--------------+ FV Prox  Full                                                         +---------+---------------+---------+-----------+----------+--------------+ FV Mid   Full                                                        +---------+---------------+---------+-----------+----------+--------------+ FV DistalFull                                                        +---------+---------------+---------+-----------+----------+--------------+   PFV      Full                                                        +---------+---------------+---------+-----------+----------+--------------+ POP      Full           Yes      Yes                                 +---------+---------------+---------+-----------+----------+--------------+ PTV      Full                                                        +---------+---------------+---------+-----------+----------+--------------+ PERO     Full                                                        +---------+---------------+---------+-----------+----------+--------------+     Summary: BILATERAL: - No evidence of deep vein thrombosis seen in the lower extremities, bilaterally. -No evidence of popliteal cyst, bilaterally.   *See table(s) above for measurements and observations.    Preliminary    [place] EKG SITE RITE  Result Date: 02/15/2021 If Site Rite image not attached, placement could not be confirmed due to current cardiac rhythm.  [place] EKG SITE RITE  Result Date: 02/15/2021 If Site Rite image not attached, placement could not be confirmed due to current cardiac rhythm.  CT CHEST ABDOMEN PELVIS WO CONTRAST  Result Date: 02/15/2021 CLINICAL DATA:  70 year old female with altered mental status and sepsis. EXAM: CT CHEST, ABDOMEN AND PELVIS WITHOUT CONTRAST TECHNIQUE: Multidetector CT imaging of the chest, abdomen and pelvis was performed following the standard protocol without IV contrast. COMPARISON:  Chest CT dated 01/14/2021. FINDINGS: Evaluation of this exam is limited in the absence of intravenous  contrast. Evaluation is also limited due to streak artifact caused by patient's arms. CT CHEST FINDINGS Cardiovascular: There is no cardiomegaly or pericardial effusion. Mild atherosclerotic calcification of the thoracic aorta. No aneurysmal dilatation. The central pulmonary arteries are grossly unremarkable. Mediastinum/Nodes: No hilar or mediastinal adenopathy. The esophagus and thyroid gland are grossly unremarkable. No mediastinal fluid collection. Lungs/Pleura: Small bilateral pleural effusions. There is minimal compressive atelectasis of the lower lobes. Pneumonia is not excluded. Clinical correlation is recommended. No pneumothorax. The central airways are patent. Musculoskeletal: Osteopenia with degenerative changes of the spine. No acute osseous pathology. CT ABDOMEN PELVIS FINDINGS No intra-abdominal free air or free fluid. Hepatobiliary: Probable fatty liver. No intrahepatic biliary dilatation. Gallstones. No pericholecystic fluid. Pancreas: There is stranding and inflammatory changes surrounding the pancreas most consistent with acute pancreatitis. Correlation with pancreatic enzymes recommended. No drainable fluid collection/abscess or pseudocyst. Spleen: Normal in size without focal abnormality. Adrenals/Urinary Tract: The adrenal glands unremarkable. There is no hydronephrosis on either side. There is symmetric enhancement and excretion of contrast by both kidneys. Several subcentimeter right renal hypodense lesions are too small to characterize. The visualized ureters appear unremarkable. The  urinary bladder is partially distended and contains excreted contrast from recent IV administration. Stomach/Bowel: There is no bowel obstruction or active inflammation. Loose stool noted throughout the colon. The appendix is normal. Vascular/Lymphatic: Advanced aortoiliac atherosclerotic disease. The IVC is unremarkable. No portal venous gas. There is no adenopathy. Reproductive: There is a 3 cm posterior  uterine body calcified fibroid. No adnexal masses. Other: There is a 3 x 3 cm hematoma in the soft tissues of the right groin. Overlying compression is noted. Musculoskeletal: Osteopenia with degenerative changes of the spine. No acute osseous pathology. IMPRESSION: 1. Acute pancreatitis. No drainable fluid collection/abscess or pseudocyst. 2. A 3 cm hematoma in the right groin. 3. Cholelithiasis. 4. Small bilateral pleural effusions with minimal compressive atelectasis of the lower lobes. Pneumonia is not excluded. Clinical correlation is recommended. 5. Aortic Atherosclerosis (ICD10-I70.0). These results were called by telephone at the time of interpretation on 02/15/2021 at 11:31 pm to the patient's nurse, Netta Corrigan, who verbally acknowledged these results. Electronically Signed   By: Anner Crete M.D.   On: 02/15/2021 23:34        Scheduled Meds:  atorvastatin  40 mg Oral Daily   busPIRone  5 mg Oral BID   Chlorhexidine Gluconate Cloth  6 each Topical Daily   collagenase   Topical BID   feeding supplement  237 mL Oral BID BM   lactulose  20 g Oral TID   multivitamin with minerals  1 tablet Oral Daily   nutrition supplement (JUVEN)  1 packet Oral BID BM   pantoprazole  40 mg Oral Daily   pyridostigmine  60 mg Oral TID   sodium chloride flush  10-40 mL Intracatheter Q12H   sodium chloride flush  3 mL Intravenous Q12H   Continuous Infusions:  sodium chloride     cefTRIAXone (ROCEPHIN)  IV 2 g (02/16/21 2132)   dextrose 5 % and 0.9% NaCl 100 mL/hr at 02/17/21 0611     LOS: 6 days        Hosie Poisson, MD [facility]   To contact the attending provider between 7A-7P or the covering provider during after hours 7P-7A, please log into the web site [email or web address] and access using universal [place] password for that web site. If you do not have the password, please call the hospital operator.  02/17/2021, 1:33 PM

## 2021-02-17 NOTE — Progress Notes (Signed)
Physical Therapy Treatment Patient Details Name: [name] MRN: [id number] DOB: [birth year] Today's Date: 02/17/2021    History of Present Illness 70 yo admitted 7/21 to [facility] with left heel ulcer and ischemia. Transfer to [facility] 7/22. 7/25 angiography with SFA stent and Left tibioperoneal angioplasty. Pt with AMS post procedure with head CT negative.  PMhx: GERD, anxiety/depression, HTN, HLD, OSA, gout, asthma.    PT Comments    Pt received in supine, oriented to self/DOB and agreeable to therapy session, with good participation and improved following of simple 1-step commands. Pt with slow processing but able to perform supine UE/LE therapeutic exercises with multimodal cues and A/AAROM, she did need min assist to raise cup to her mouth to take sips of her drinks but was able to perform with each arm. When given 3 distinct choices (a little, medium or very tired), able to state "very tired" after long sitting in bed with +1-2 assist for trunk support and B bed rail support. Plan to progress EOB transfer and seated balance next session and standing transfers if able. Pt continues to benefit from PT services to progress toward functional mobility goals.   Follow Up Recommendations  SNF;Supervision/Assistance - 24 hour     Equipment Recommendations  Wheelchair (measurements PT);Wheelchair cushion (measurements PT);Hospital bed (may need to consider mechanical lift, pending progress)    Recommendations for Other Services       Precautions / Restrictions Precautions Precautions: Fall Precaution Comments: R forearm blisters and edema Restrictions Weight Bearing Restrictions: No    Mobility  Bed Mobility Overal bed mobility: Needs Assistance Bed Mobility: Supine to Sit     Supine to sit: HOB elevated;Mod assist;+2 for physical assistance     General bed mobility comments: from elevated HOB, able to assist to pull herself to long sit with bilateral bed side rails for support, needs  +58modA initially progressing to min/modA for long sitting balance. Pt unable to transition to EOB due to c/o severe fatigue after sitting upright ~2 mins.    Transfers                 General transfer comment: unable       Balance Overall balance assessment: Needs assistance Sitting-balance support: Feet unsupported;Bilateral upper extremity supported (long sit in bed using B side rails with feet lowered) Sitting balance-Leahy Scale: Poor Sitting balance - Comments: pt needs min to modA for seated balance, initially +2 support but progressed to +1 assist, unable to multi-task while in long sit       Standing balance comment: pt unable             Cognition Arousal/Alertness: Awake/alert Behavior During Therapy: Flat affect Overall Cognitive Status: Impaired/Different from baseline Area of Impairment: Orientation;Attention;Following commands;Awareness;Safety/judgement;Problem solving          Orientation Level: Disoriented to;Place;Situation;Time (able to state correct DOB and name) Current Attention Level: Selective   Following Commands: Follows one step commands with increased time;Follows multi-step commands inconsistently Safety/Judgement: Decreased awareness of safety;Decreased awareness of deficits Awareness: Intellectual Problem Solving: Slow processing;Difficulty sequencing;Requires verbal cues;Requires tactile cues General Comments: Pt with improved alertness this afternoon, following most simple 1-step commands with increased time to process/respond to cues. Pt unable to follow 2-step commands.      Exercises Other Exercises Other Exercises: supine BLE AROM: ankle pumps, SAQ x10 reps ea Other Exercises: supine BUE AAROM: shoulder flexion, elbow flex/ext, AROM gross grasp/finger extension x10 reps ea Other Exercises: supine to long sit pulling with BUE  x5 reps    General Comments General comments (skin integrity, edema, etc.): VSS on RA, noted RUE  forearm blisters and edema, RUE propped up on 2 pillows to elevate at end of session; BP 131/53 (71) supine pre-mobility and 138/58 (77) with bed in chair position after long sit activity; HR 90's bpm Pt also able to brush lower front teeth with set-up assist.      Pertinent Vitals/Pain Pain Assessment: No/denies pain Faces Pain Scale: No hurt Pain Intervention(s): Monitored during session;Repositioned    Home Living                      Prior Function            PT Goals (current goals can now be found in the care plan section) Acute Rehab PT Goals Patient Stated Goal: unable to state PT Goal Formulation: Patient unable to participate in goal setting Time For Goal Achievement: 03/02/21 Progress towards PT goals: Progressing toward goals    Frequency    Min 3X/week      PT Plan Current plan remains appropriate    Co-evaluation              AM-PAC PT "6 Clicks" Mobility   Outcome Measure  Help needed turning from your back to your side while in a flat bed without using bedrails?: A Lot Help needed moving from lying on your back to sitting on the side of a flat bed without using bedrails?: A Lot Help needed moving to and from a bed to a chair (including a wheelchair)?: Total Help needed standing up from a chair using your arms (e.g., wheelchair or bedside chair)?: Total Help needed to walk in hospital room?: Total Help needed climbing 3-5 steps with a railing? : Total 6 Click Score: 8    End of Session   Activity Tolerance: Patient tolerated treatment well;Patient limited by fatigue Patient left: in bed;with call bell/phone within reach;with bed alarm set;Other (comment) (bed in chair position, heels floated, pt encouraged to sit upright at least 30 mins after drinking water and ensure, RN/NT notified) Nurse Communication: Mobility status;Other (comment) (RUE edema/blisters) PT Visit Diagnosis: Other abnormalities of gait and mobility (R26.89);Muscle  weakness (generalized) (M62.81);Other symptoms and signs involving the nervous system (R29.898)     Time: 8325-4982 PT Time Calculation (min) (ACUTE ONLY): 28 min  Charges:  $Therapeutic Exercise: 8-22 mins $Therapeutic Activity: 8-22 mins                     Shirrell Solinger P., PTA Acute Rehabilitation Services Pager: [phone] Office: [place] 02/17/2021, 6:01 PM

## 2021-02-17 NOTE — Social Work (Signed)
                                                                                                                          Re: [name] Date of Birth: [birth year] Date: 02/17/2021  To Whom It May Concern:  Please be advised that the above-named patient will require a short-term nursing home stay-anticipated 30 days or less for rehabilitation and strengthening. The plan is to return home.

## 2021-02-17 NOTE — Progress Notes (Signed)
The patient is showing much improvement today. She is more alert and oriented x3. She has eaten 50% of all meals and drank adequate fluids all day. Blood sugars and all VS are stable. Patient also had large BM today. Daughter aware of improvement. Fuller Canada, RN

## 2021-02-17 NOTE — Procedures (Signed)
Patient Name: [name]  MRN: [id number]  Epilepsy Attending: Lora Havens  Referring Physician/Provider: Dr Hosie Poisson Date: 02/16/2021 Duration: 25.30 mins  Patient history: 70 year old female with altered mental status.  EEG to evaluate for seizures.  Level of alertness: Awake  AEDs during EEG study: None  Technical aspects: This EEG study was done with scalp electrodes positioned according to the 10-20 International system of electrode placement. Electrical activity was acquired at a sampling rate of 500Hz  and reviewed with a high frequency filter of 70Hz  and a low frequency filter of 1Hz . EEG data were recorded continuously and digitally stored.   Description: No posterior dominant rhythm was seen.  EEG showed continuous generalized 3 to 5 Hz theta-delta slowing. Generalized periodic discharges with triphasic morphology at 1.5 to 2 Hz were also noted.  Hyperventilation and photic stimulation were not performed.     ABNORMALITY - Periodic discharges with triphasic morphology, generalized ( GPDs) - Continuous slow, generalized  IMPRESSION: This study showed generalized periodic discharges with triphasic morphology at 1.5 to 2 Hz which is on the ictal-interictal continuum.  However given the morphology and frequency, is more likely due to underlying toxic-metabolic causes.  Additionally there is evidence of moderate diffuse encephalopathy, nonspecific etiology but likely due to toxic metabolic causes.  No seizures were seen throughout the recording.  [name] [name]

## 2021-02-18 ENCOUNTER — Inpatient Hospital Stay (HOSPITAL_COMMUNITY): Payer: Medicare Other

## 2021-02-18 DIAGNOSIS — E1159 Type 2 diabetes mellitus with other circulatory complications: Secondary | ICD-10-CM | POA: Diagnosis not present

## 2021-02-18 DIAGNOSIS — G7000 Myasthenia gravis without (acute) exacerbation: Secondary | ICD-10-CM | POA: Diagnosis not present

## 2021-02-18 DIAGNOSIS — G4733 Obstructive sleep apnea (adult) (pediatric): Secondary | ICD-10-CM | POA: Diagnosis not present

## 2021-02-18 DIAGNOSIS — D72829 Elevated white blood cell count, unspecified: Secondary | ICD-10-CM | POA: Diagnosis not present

## 2021-02-18 LAB — BASIC METABOLIC PANEL
Anion gap: 6 (ref 5–15)
BUN: 32 mg/dL — ABNORMAL HIGH (ref 8–23)
CO2: 21 mmol/L — ABNORMAL LOW (ref 22–32)
Calcium: 8.5 mg/dL — ABNORMAL LOW (ref 8.9–10.3)
Chloride: 115 mmol/L — ABNORMAL HIGH (ref 98–111)
Creatinine, Ser: 1.17 mg/dL — ABNORMAL HIGH (ref 0.44–1.00)
GFR, Estimated: 50 mL/min — ABNORMAL LOW (ref >60)
Glucose, Bld: 134 mg/dL — ABNORMAL HIGH (ref 70–99)
Potassium: 3.2 mmol/L — ABNORMAL LOW (ref 3.5–5.1)
Sodium: 142 mmol/L (ref 135–145)

## 2021-02-18 LAB — CBC
HCT: 21.8 % — ABNORMAL LOW (ref 36.0–46.0)
Hemoglobin: 7.2 g/dL — ABNORMAL LOW (ref 12.0–15.0)
MCH: 31.2 pg (ref 26.0–34.0)
MCHC: 33 g/dL (ref 30.0–36.0)
MCV: 94.4 fL (ref 80.0–100.0)
Platelets: 234 10*3/uL (ref 150–400)
RBC: 2.31 MIL/uL — ABNORMAL LOW (ref 3.87–5.11)
RDW: 17.9 % — ABNORMAL HIGH (ref 11.5–15.5)
WBC: 15.6 10*3/uL — ABNORMAL HIGH (ref 4.0–10.5)
nRBC: 0.3 % — ABNORMAL HIGH (ref 0.0–0.2)

## 2021-02-18 LAB — GLUCOSE, CAPILLARY
Glucose-Capillary: 106 mg/dL — ABNORMAL HIGH (ref 70–99)
Glucose-Capillary: 95 mg/dL (ref 70–99)
Glucose-Capillary: 147 mg/dL — ABNORMAL HIGH (ref 70–99)

## 2021-02-18 LAB — AEROBIC CULTURE W GRAM STAIN (SUPERFICIAL SPECIMEN): Gram Stain: NONE SEEN

## 2021-02-18 MED ORDER — POTASSIUM CHLORIDE CRYS ER 20 MEQ PO TBCR
40.0000 meq | EXTENDED_RELEASE_TABLET | Freq: Once | ORAL | Status: AC
  Administered 2021-02-18: 40 meq via ORAL
  Filled 2021-02-18: qty 2

## 2021-02-18 MED ORDER — IPRATROPIUM-ALBUTEROL 0.5-2.5 (3) MG/3ML IN SOLN
3.0000 mL | Freq: Once | RESPIRATORY_TRACT | Status: AC
  Administered 2021-02-18: 3 mL via RESPIRATORY_TRACT

## 2021-02-18 MED ORDER — ASPIRIN EC 81 MG PO TBEC
81.0000 mg | DELAYED_RELEASE_TABLET | Freq: Every day | ORAL | Status: DC
  Administered 2021-02-18 – 2021-02-24 (×7): 81 mg via ORAL
  Filled 2021-02-18 (×7): qty 1

## 2021-02-18 MED ORDER — CLOPIDOGREL BISULFATE 75 MG PO TABS
75.0000 mg | ORAL_TABLET | Freq: Every day | ORAL | Status: DC
  Administered 2021-02-18 – 2021-02-24 (×7): 75 mg via ORAL
  Filled 2021-02-18 (×7): qty 1

## 2021-02-18 MED ORDER — FLUCONAZOLE 100 MG PO TABS
200.0000 mg | ORAL_TABLET | Freq: Once | ORAL | Status: AC
  Administered 2021-02-18: 200 mg via ORAL
  Filled 2021-02-18: qty 2

## 2021-02-18 NOTE — Progress Notes (Signed)
Physical Therapy Treatment Patient Details Name: [name] MRN: [id number] DOB: 11-16-1950 Today's Date: 02/18/2021    History of Present Illness 70 yo admitted 7/21 to [facility] with left heel ulcer and ischemia. Transfer to [facility] 7/22. 7/25 angiography with SFA stent and Left tibioperoneal angioplasty. Pt with AMS/lethargy post procedure with head CT negative. PMhx: GERD, anxiety/depression, HTN, HLD, OSA, gout, lymphedema (self-reported), asthma.    PT Comments    Pt received in supine, flat affect but more conversation and participatory this session, agreeable to seated/standing transfer training. Pt performed functional mobility tasks with +75minA to modA and did well standing to Stedy from elevated bed and recliner chair postures. Pt reporting B foot pain and unwilling to attempt stand pivot transfer with RW so totalA to chair with Stedy. Plan to progress standing mobility/gait next session as tolerated, may need some footwear for comfort? Pt continues to benefit from PT services to progress toward functional mobility goals.    Follow Up Recommendations  SNF;Supervision/Assistance - 24 hour     Equipment Recommendations  Wheelchair (measurements PT);Wheelchair cushion (measurements PT);Hospital bed (may need to consider mechanical lift, pending progress)    Recommendations for Other Services       Precautions / Restrictions Precautions Precautions: Fall Precaution Comments: R forearm blisters and edema Restrictions Weight Bearing Restrictions: No    Mobility  Bed Mobility Overal bed mobility: Needs Assistance Bed Mobility: Rolling;Sidelying to Sit Rolling: Mod assist Sidelying to sit: Min assist;+2 for safety/equipment       General bed mobility comments: cues for log roll to R, pt able to reach for opposite bed rail with hand over hand assist and transfer pad assist to roll onto hip; good following of cues for weight shifting/pushing up to raise trunk, slow to process  some cues    Transfers Overall transfer level: Needs assistance   Transfers: Sit to/from Stand Sit to Stand: Min assist;+2 physical assistance;From elevated surface         General transfer comment: from elevated bed height to [facility] and from recliner chair<>Stedy, increased time to rise but good following of cues for weight shift/trunk extension. Total A to pivot to chair in [place] but pt able to minimally shift weight foot to foot.  Ambulation/Gait         General Gait Details: pt reporting foot pain and poor effort to weight shift within [place], will attempt to progress next session.          Balance Overall balance assessment: Needs assistance Sitting-balance support: Feet unsupported;Bilateral upper extremity supported (long sit in bed using B side rails with feet lowered) Sitting balance-Leahy Scale: Poor Sitting balance - Comments: pt needs min guard for EOB seated balance and upright posture within Stedy   Standing balance support: Bilateral upper extremity supported Standing balance-Leahy Scale: Poor Standing balance comment: minA +2 for safety, limited standing tolerance due to reported foot pain ~30 seconds each attempt (x2 total)                Cognition Arousal/Alertness: Awake/alert Behavior During Therapy: Flat affect Overall Cognitive Status: Impaired/Different from baseline Area of Impairment: Orientation;Attention;Following commands;Awareness;Safety/judgement;Problem solving                 Orientation Level:  (able to state correct DOB and name) Current Attention Level: Selective   Following Commands: Follows one step commands with increased time;Follows multi-step commands inconsistently Safety/Judgement: Decreased awareness of safety;Decreased awareness of deficits Awareness: Intellectual Problem Solving: Slow processing;Difficulty sequencing;Requires verbal cues;Requires tactile  cues General Comments: Pt alert, following most simple  1-step commands with increased time to process/respond to cues. Pt with improved ability to respond to questions and stating preferences this date ("I don't like chocolate ice cream", "NO applesauce"), etc. and able to carry on conversation but remains with flat affect and very literal responses to questions.      Exercises Other Exercises Other Exercises: seated BLE AAROM: ankle pumps (AROM), LAQ, hip flexion x5 reps ea Other Exercises: STS x2 reps and standing tolerance 30-60 seconds ea    General Comments General comments (skin integrity, edema, etc.): pt with blisters to R forearm (similar to presentation previous date, pt now able to state she has hx of Lymphedema), pt with some recall of previous session/therapists and VSS on 3L O2 [place] (RN notified may be able to wean?)      Pertinent Vitals/Pain Pain Assessment: No/denies pain Faces Pain Scale: No hurt Pain Intervention(s): Monitored during session;Repositioned     PT Goals (current goals can now be found in the care plan section) Acute Rehab PT Goals Patient Stated Goal: to get OOB PT Goal Formulation: Patient unable to participate in goal setting Time For Goal Achievement: 03/02/21 Progress towards PT goals: Progressing toward goals    Frequency    Min 3X/week      PT Plan Current plan remains appropriate       AM-PAC PT "6 Clicks" Mobility   Outcome Measure  Help needed turning from your back to your side while in a flat bed without using bedrails?: A Lot Help needed moving from lying on your back to sitting on the side of a flat bed without using bedrails?: A Lot Help needed moving to and from a bed to a chair (including a wheelchair)?: Total Help needed standing up from a chair using your arms (e.g., wheelchair or bedside chair)?: A Lot Help needed to walk in hospital room?: Total Help needed climbing 3-5 steps with a railing? : Total 6 Click Score: 9    End of Session Equipment Utilized During Treatment:  Gait belt;Oxygen Activity Tolerance: Patient tolerated treatment well Patient left: with call bell/phone within reach;Other (comment);in chair;with chair alarm set (RN notified food preferences pt expressed) Nurse Communication: Mobility status;Other (comment);Need for lift equipment (pt stood well from recliner with Stedy and minA +2 for safety; pt may need feeding assist hasn't touched food tray) PT Visit Diagnosis: Other abnormalities of gait and mobility (R26.89);Muscle weakness (generalized) (M62.81);Other symptoms and signs involving the nervous system (R29.898)     Time: 2725-3664 PT Time Calculation (min) (ACUTE ONLY): 35 min  Charges:  $Therapeutic Exercise: 8-22 mins $Therapeutic Activity: 8-22 mins                    Reginia Battie P., PTA Acute Rehabilitation Services Pager: [phone] Office: [place] 02/18/2021, 6:54 PM

## 2021-02-18 NOTE — Progress Notes (Signed)
  Speech Language Pathology Treatment: Dysphagia  Patient Details Name: [name] MRN: [id number] DOB: 10/31/1950 Today's Date: 02/18/2021 Time: 1355-1410 SLP Time Calculation (min) (ACUTE ONLY): 15 min  Assessment / Plan / Recommendation Clinical Impression  Pt was seen for dysphagia treatment. Pt's nurse reported that the pt exhibited coughing this morning with thin liquids which were given following meds crushed in puree, but that she tolerated lunch (~50%) without pocketing or any signs of aspiration. Pt was alert and cooperative during the session and she communicated verbally. Pt reported that lunch was "good", but not like she has it at home. Pt tolerated dysphagia 2 solids, regular texture solids, and consecutive swallows of thin liquids via straw without overt s/sx of aspiration. Mastication was prolonged with regular textures, but functional with dysphagia 2. Mild-moderate lingual residue was noted with regular texture solids, but this was cleared with a liquid wash. Oral clearance was [facility] for dysphagia 2 solids. Pt's diet will be advanced to dysphagia 2 solids. SLP will continue to follow pt.    HPI HPI: 70 yo admitted 7/21 to [facility] with left heel ulcer and ischemia. Transfer to [facility] 7/22. 7/25 angiography with SFA stent and Left tibioperoneal angioplasty. Pt with AMS post procedure with head CT and MRI negative.  PMhx: GERD, anxiety/depression, HTN, HLD, OSA, gout, asthma      SLP Plan  Continue with current plan of care       Recommendations  Diet recommendations: Dysphagia 2 (fine chop);Thin liquid Liquids provided via: Cup;Straw Medication Administration: Crushed with puree Supervision: Staff to assist with self feeding Compensations: Minimize environmental distractions;Slow rate;Small sips/bites Postural Changes and/or Swallow Maneuvers: Seated upright 90 degrees                Oral Care Recommendations: Oral care BID Follow up Recommendations: Skilled Nursing  facility SLP Visit Diagnosis: Dysphagia, oral phase (R13.11) Plan: Continue with current plan of care       Dequane Strahan I. Hardin Negus, [place], [place] Office number [phone] Pager [place] 02/18/2021, 2:29 PM

## 2021-02-18 NOTE — Progress Notes (Signed)
Nutrition Follow Up   DOCUMENTATION CODES:   Not applicable  INTERVENTION:   Assistance with all meals  Ensure Enlive po BID, each supplement provides 350 kcal and 20 grams of protein Magic cup TID with meals, each supplement provides 290 kcal and 9 grams of protein MVI with minerals daily   NUTRITION DIAGNOSIS:   Increased nutrient needs related to wound healing as evidenced by estimated needs.  Ongoing  GOAL:   Patient will meet greater than or equal to 90% of their needs  Not meeting   MONITOR:   PO intake, Supplement acceptance, Labs, Weight trends  REASON FOR ASSESSMENT:   Consult Wound healing  ASSESSMENT:   70 yo female admitted with nonhealing wound of left heel. PMH includes DM, chronic lymphedema, OSA, GERD  7/25- s/p balloon angioplasty L tibioperoneal trunk and stent of L SFA  Patient somnolent this am. Unable to remain awake for RD conversation. Discussed with RN. Patient had coughing after swallowing pills yesterday. SLP evaluated and found patient is pocketing food/coughing most likely due to cognitive dysphagia. Diet downgraded to dysphagia 1 with thin liquids. Nursing helped feed lunch today and she was able to consume 50%.   Recommend continued assistance with all meals. RD to order additional supplements to maximize kcal and protein. If intake unable to progress or swallow function declines, consider Cortrak.   Admission weight: 108.4 kg  Current weight: 106.4 kg   Drips: D5 in NS @ 100 ml/hr  Medications: lactulose Labs: K 3.2 (L) CBG 95-132  NUTRITION - FOCUSED PHYSICAL EXAM:  Flowsheet Row Most Recent Value  Orbital Region No depletion  Upper Arm Region No depletion  Thoracic and Lumbar Region Unable to assess  Buccal Region No depletion  Temple Region No depletion  Clavicle Bone Region No depletion  Clavicle and Acromion Bone Region No depletion  Scapular Bone Region Unable to assess  Dorsal Hand No depletion  Patellar Region  Unable to assess  Anterior Thigh Region Unable to assess  Posterior Calf Region Unable to assess  Edema (RD Assessment) Unable to assess  Hair Reviewed  Eyes Reviewed  Mouth Reviewed  Skin Reviewed  Nails Reviewed      Diet Order:   Diet Order             DIET - DYS 1 Room service appropriate? No; Fluid consistency: Thin  Diet effective now                   EDUCATION NEEDS:   Education needs have been addressed  Skin:  Skin Assessment: Skin Integrity Issues: Skin Integrity Issues:: Other (Comment) Other: non healing wound on L heel  Last BM:  7/28  Height:   Ht Readings from Last 1 Encounters:  02/11/21 5\' 5"  (1.651 m)    Weight:   Wt Readings from Last 1 Encounters:  02/17/21 106.4 kg    BMI:  Body mass index is 39.03 kg/m.  Estimated Nutritional Needs:   Kcal:  1750-1950 kcals  Protein:  105-115 g  Fluid:  >/= 1.8 L   Zarielle Cea MS, RD, LDN, CNSC Clinical Nutrition Pager listed in [place]

## 2021-02-18 NOTE — Progress Notes (Signed)
Mobility Specialist: Progress Note   02/18/21 1808  Mobility  Activity Transferred:  Bed to chair  Level of Assistance +2 (takes two people)  Assistive Device Stedy  Mobility Out of bed to chair with meals  Mobility Response Tolerated well  Mobility performed by Mobility specialist;Other (comment) (PTA Carly)  Bed Position Chair  $Mobility charge 1 Mobility   Pre-Mobility: 71 HR, 133/65 BP, 100% SpO2 Post-Mobility: 80 HR, 133/62 BP, 97% SpO2  Pt on 2 L/min throughout session. Pt required minA for bed mobility as well as to stand with verbal cues for hand placement. Pt transferred to the recliner using the [facility] and was able to perform STS x2 from the chair. Pt has call bell and phone in her lap. Chair alarm is on.   [facility] [place] Mobility Specialist Mobility Specialist Phone: [phone]

## 2021-02-18 NOTE — TOC Progression Note (Signed)
Transition of Care [facility]) - Progression Note    Patient Details  Name: [name] MRN: [id number] Date of Birth: [birth year]  Transition of Care [facility]) CM/SW [place], [place] Phone Number: 02/18/2021, 12:39 PM  Clinical Narrative:     CSW spoke with patient's daughter in [place] @ [phone]. CSW provide bed offers. She states she will discuss with family and call CSW back with SNF choice.   CSW sent message to [place] to review - waiting on response.  Thurmond Butts, MSW, LCSW Clinical Social Worker    Expected Discharge Plan: Skilled Nursing Facility Barriers to Discharge: Ship broker, Continued Medical Work up, SNF Pending bed offer  Expected Discharge Plan and Services Expected Discharge Plan: [place] In-house Referral: Clinical Social Work Discharge Planning Services: CM Consult   Living arrangements for the past 2 months: Single Family Home                                       Social Determinants of Health (SDOH) Interventions    Readmission Risk Interventions Readmission Risk Prevention Plan 02/12/2021 10/20/2020 10/04/2020  Transportation Screening Complete Complete Complete  PCP or Specialist Appt within 5-7 Days - - Complete  Home Care Screening - - Complete  Medication Review (RN CM) - - Complete  HRI or [place] - Complete -  Social Work Consult for [place] Planning/Counseling - Complete -  Palliative Care Screening - Not Applicable -  Medication Review Press photographer) Complete Complete -  [place] or Home Care Consult Complete - -  SW Recovery Care/Counseling Consult Complete - -  Palliative Care Screening Not Applicable - -  [place] Not Applicable - -  Some recent data might be hidden

## 2021-02-18 NOTE — Progress Notes (Signed)
PROGRESS NOTE    [name]  PPJ:[id number] DOB: Nov 30, 1950 DOA: 02/11/2021 PCP: Leslie Andrea, MD    Chief Complaint  Patient presents with   Leg Pain    Brief Narrative:  70 y/o female sent to ED from wound care center for worsening of L foot wound. There was concern for underlying osteomyelitis since per outpatient provider, wound could be probed to the bone. MRI foot did not show any osteomyelitis or abscess. ABI were 0.75, but likely falsely elevated. Since here wound was not healing despite wound care and antibiotics, she as seen by vascular and underwent aortogram with bilateral lower extremity runoff, s/p balloon angioplasty left tibioperoneal trunk and placement of a stent of left SFA. Patient bcame somnolent post procedure and  confused.  Initial CT of the head is negative for acute stroke.  MRI of the brain without contrast  negative for acute stroke.  Assessment & Plan:   Active Problems:   HLD (hyperlipidemia)   OBSTRUCTIVE SLEEP APNEA   Ocular myasthenia gravis (HCC)   Hypomagnesemia   Social problem   Open wound of plantar aspect of foot   DM2 (diabetes mellitus, type 2) (HCC)   Nonhealing left heel wound and cellulitis On admission patient was found to have purulent discharge and foul-smelling from the left heel wound. She recently completed treatment with ciprofloxacin for Serratia isolated from the wound cultures. Repeat wound cultures are pending, rare gram positive cocci seen. Further identification and sensitivities are pending.  MRI of the foot does not show any osteomyelitis or abscess Vascular surgery consulted and underwent aortogram, balloon angioplasty and left SFA stent. Started her on aspirin, plavix and continue with statin.    Acute encephalopathy of unclear etiology/ metabolic encephalopathy.  Differentials include lethargy from moderate sedation vs  encephalopathy from elevated ammonia levels. EEG does not show any acute abnormality,  TSH wnl, vitamin b12 levels are wnl.  UA is slightly abnormal , but pt is already on IV cefepime transitioned to IV rocephin. .  Ammonia levels elevated, added lactulose.  Initial CT head negative, follow up with MRI of the brain does not show any acute stroke.  Neurology consulted, no further recommendations at this time.   Type 2 diabetes mellitus A1c is 4.9 Continue with sliding scale insulin. CBG (last 3)  Recent Labs    02/17/21 1605 02/17/21 2113 02/18/21 0632  GLUCAP 132* 127* 106*   No changes in meds.     Lymphedema   Mild AKI With metabolic acidosis:  Suspect from dehydration, hypotension.  Renal parameters have improved.    Hypokalemia: replaced.    Obstructive sleep apnea Continue with CPAP   Ocular myasthenia gravis Patient on pyridostigmine  Hyperlipidemia Continue with statin   Deconditioning and generalized weakness:  Therapy evaluations recommending SNF.    DVT prophylaxis: Heparin Code Status: Full code Family Communication: (None at bedside) discussed with family ont he phone. ( Daughter in Sports coach) Disposition:   Status is: Inpatient  Remains inpatient appropriate because:Ongoing diagnostic testing needed not appropriate for outpatient work up, Unsafe d/c plan, and IV treatments appropriate due to intensity of illness or inability to take PO  Dispo: The patient is from: Home              Anticipated d/c is to: SNF              Patient currently is not medically stable to d/c.   Difficult to place patient No       Consultants:  Vascular surgery  Procedures: MRI of the brain Aortogram Antimicrobials:  Antibiotics Given (last 72 hours)     Date/Time Action Medication Dose Rate   02/15/21 2311 New Bag/Given   ceFEPIme (MAXIPIME) 2 g in sodium chloride 0.9 % 100 mL IVPB 2 g 200 mL/hr   02/16/21 0840 New Bag/Given   ceFEPIme (MAXIPIME) 2 g in sodium chloride 0.9 % 100 mL IVPB 2 g 200 mL/hr   02/16/21 2132 New Bag/Given    cefTRIAXone (ROCEPHIN) 2 g in sodium chloride 0.9 % 100 mL IVPB 2 g 200 mL/hr   02/17/21 2040 New Bag/Given   cefTRIAXone (ROCEPHIN) 2 g in sodium chloride 0.9 % 100 mL IVPB 2 g 200 mL/hr         Subjective: Sleepy, but following simple commands.   Objective: Vitals:   02/17/21 2345 02/17/21 2346 02/18/21 0400 02/18/21 0745  BP: (!) 108/57  (!) 112/46 (!) 127/41  Pulse:   70 76  Resp: 18 18 18 14   Temp: 97.6 F (36.4 C)  97.6 F (36.4 C) 97.7 F (36.5 C)  TempSrc: Oral  Oral Oral  SpO2: 100%  100% 100%  Weight:      Height:        Intake/Output Summary (Last 24 hours) at 02/18/2021 0941 Last data filed at 02/18/2021 7564 Gross per 24 hour  Intake 3318.69 ml  Output 450 ml  Net 2868.69 ml    Filed Weights   02/11/21 1909 02/16/21 0353 02/17/21 0300  Weight: 108.4 kg 105.9 kg 106.4 kg    Examination:  General exam: Elderly woman, comfortable not in any kind of distress Respiratory system: Diminished air entry at bases, scattered wheezing found on 2 L of nasal cannula oxygen Cardiovascular system: RRR.  Pedal edema present, no JVD Gastrointestinal system: Abdomen is soft nontender nondistended bowel sounds normal Central nervous system: Lethargic but able to follow simple commands, Extremities: Left heel wound bandaged Skin: left heel wound.  Psychiatry:  cannot be assessed.     Data Reviewed: I have personally reviewed following labs and imaging studies  CBC: Recent Labs  Lab 02/11/21 1432 02/12/21 0349 02/14/21 0124 02/15/21 0755 02/15/21 1730 02/16/21 0959 02/18/21 0436  WBC 10.9*   < > 15.1* 28.4* 27.0* 19.0* 15.6*  NEUTROABS 7.7  --   --   --  23.7*  --   --   HGB 10.0*   < > 8.6* 8.7* 8.9* 7.9* 7.2*  HCT 29.6*   < > 25.4* 25.7* 25.3* 24.0* 21.8*  MCV 93.1   < > 93.0 92.1 91.0 94.5 94.4  PLT 378   < > 302 312 323 261 234   < > = values in this interval not displayed.     Basic Metabolic Panel: Recent Labs  Lab 02/11/21 1432 02/12/21 0349  02/14/21 0124 02/15/21 0755 02/16/21 0959 02/17/21 0431 02/18/21 0436  NA 134* 135 138 138 138 141 142  K 4.3 4.0 4.0 3.9 3.9 3.5 3.2*  CL 104 108 110 110 114* 116* 115*  CO2 22 20* 19* 21* 18* 22 21*  GLUCOSE 98 93 82 77 95 93 134*  BUN 16 15 14  24* 30* 31* 32*  CREATININE 1.02* 0.96 1.18* 1.18*  1.14* 1.39* 1.25* 1.17*  CALCIUM 8.2* 8.1* 8.2* 8.5* 8.3* 8.5* 8.5*  MG 1.3* 1.9  --   --   --   --   --   PHOS  --   --   --  2.7  --   --   --  GFR: Estimated Creatinine Clearance: 53.5 mL/min (A) (by C-G formula based on SCr of 1.17 mg/dL (H)).  Liver Function Tests: Recent Labs  Lab 02/12/21 0349 02/15/21 0755  AST 27  --   ALT 17  --   ALKPHOS 74  --   BILITOT 0.6  --   PROT 6.0*  --   ALBUMIN 2.6* 1.9*     CBG: Recent Labs  Lab 02/17/21 0846 02/17/21 1109 02/17/21 1605 02/17/21 2113 02/18/21 0632  GLUCAP 94 110* 132* 127* 106*      Recent Results (from the past 240 hour(s))  Culture, blood (routine x 2)     Status: None   Collection Time: 02/11/21  2:37 PM   Specimen: Right Antecubital; Blood  Result Value Ref Range Status   Specimen Description   Final    RIGHT ANTECUBITAL BOTTLES DRAWN AEROBIC AND ANAEROBIC   Special Requests Blood Culture adequate volume  Final   Culture   Final    NO GROWTH 5 DAYS Performed at [facility], [address], [place], [place] [address]    Report Status 02/16/2021 FINAL  Final  Resp Panel by RT-PCR (Flu A&B, Covid) Nasopharyngeal Swab     Status: None   Collection Time: 02/11/21  3:31 PM   Specimen: Nasopharyngeal Swab; Nasopharyngeal(NP) swabs in vial transport medium  Result Value Ref Range Status   SARS Coronavirus 2 by RT PCR NEGATIVE NEGATIVE Final    Comment: (NOTE) SARS-CoV-2 target nucleic acids are NOT DETECTED.  The SARS-CoV-2 RNA is generally detectable in upper respiratory specimens during the acute phase of infection. The lowest concentration of SARS-CoV-2 viral copies this assay can detect  is 138 copies/mL. A negative result does not preclude SARS-Cov-2 infection and should not be used as the sole basis for treatment or other patient management decisions. A negative result may occur with  improper specimen collection/handling, submission of specimen other than nasopharyngeal swab, presence of viral mutation(s) within the areas targeted by this assay, and inadequate number of viral copies(<138 copies/mL). A negative result must be combined with clinical observations, patient history, and epidemiological information. The expected result is Negative.  Fact Sheet for Patients:  EntrepreneurPulse.com.au  Fact Sheet for Healthcare Providers:  IncredibleEmployment.be  This test is no t yet approved or cleared by the [place] FDA and  has been authorized for detection and/or diagnosis of SARS-CoV-2 by FDA under an Emergency Use Authorization (EUA). This EUA will remain  in effect (meaning this test can be used) for the duration of the COVID-19 declaration under Section 564(b)(1) of the Act, 21 U.S.C.section 360bbb-3(b)(1), unless the authorization is terminated  or revoked sooner.       Influenza A by PCR NEGATIVE NEGATIVE Final   Influenza B by PCR NEGATIVE NEGATIVE Final    Comment: (NOTE) The Xpert Xpress SARS-CoV-2/FLU/RSV plus assay is intended as an aid in the diagnosis of influenza from Nasopharyngeal swab specimens and should not be used as a sole basis for treatment. Nasal washings and aspirates are unacceptable for Xpert Xpress SARS-CoV-2/FLU/RSV testing.  Fact Sheet for Patients: EntrepreneurPulse.com.au  Fact Sheet for Healthcare Providers: IncredibleEmployment.be  This test is not yet approved or cleared by the [place] FDA and has been authorized for detection and/or diagnosis of SARS-CoV-2 by FDA under an Emergency Use Authorization (EUA). This EUA will remain in effect  (meaning this test can be used) for the duration of the COVID-19 declaration under Section 564(b)(1) of the Act, 21 U.S.C. section 360bbb-3(b)(1), unless the  authorization is terminated or revoked.  Performed at [facility], [address] [place], [place] [address]   Culture, blood (routine x 2)     Status: None   Collection Time: 02/11/21  3:54 PM   Specimen: Left Antecubital; Blood  Result Value Ref Range Status   Specimen Description   Final    LEFT ANTECUBITAL BOTTLES DRAWN AEROBIC AND ANAEROBIC   Special Requests Blood Culture adequate volume  Final   Culture   Final    NO GROWTH 5 DAYS Performed at [facility], [address] [place], [place] [address]    Report Status 02/16/2021 FINAL  Final  Aerobic Culture w Gram Stain (superficial specimen)     Status: None (Preliminary result)   Collection Time: 02/13/21 12:13 PM   Specimen: Wound  Result Value Ref Range Status   Specimen Description WOUND  Final   Special Requests FOOT LEFT  Final   Gram Stain   Final    NO WBC SEEN MODERATE GRAM VARIABLE ROD RARE GRAM POSITIVE COCCI IN PAIRS Performed at [facility], [place] [address], [place], [place] [address]    Culture   Final    FEW MORAXELLA SPECIES BETA LACTAMASE NEGATIVE FEW PROVIDENCIA STUARTII    Report Status PENDING  Incomplete  Surgical pcr screen     Status: None   Collection Time: 02/14/21 10:06 PM   Specimen: Nasal Mucosa; Nasal Swab  Result Value Ref Range Status   MRSA, PCR NEGATIVE NEGATIVE Final   Staphylococcus aureus NEGATIVE NEGATIVE Final    Comment: (NOTE) The Xpert SA Assay (FDA approved for NASAL specimens in patients 35 years of age and older), is one component of a comprehensive surveillance program. It is not intended to diagnose infection nor to guide or monitor treatment. Performed at [facility], [place] [address], [place], [place] [address]   Urine Culture     Status: Abnormal   Collection Time: 02/15/21  2:04 AM    Specimen: Urine, Catheterized  Result Value Ref Range Status   Specimen Description URINE, CATHETERIZED  Final   Special Requests   Final    NONE Performed at [facility], [place] [address] [place], [place] [address]    Culture 30,000 COLONIES/mL YEAST (A)  Final   Report Status 02/17/2021 FINAL  Final  Culture, blood (routine x 2)     Status: None (Preliminary result)   Collection Time: 02/15/21 12:49 PM   Specimen: BLOOD  Result Value Ref Range Status   Specimen Description BLOOD RIGHT ANTECUBITAL  Final   Special Requests   Final    BOTTLES DRAWN AEROBIC ONLY Blood Culture adequate volume   Culture   Final    NO GROWTH 2 DAYS Performed at [facility], [place] [address], [place], [place] [address]    Report Status PENDING  Incomplete  Culture, blood (routine x 2)     Status: None (Preliminary result)   Collection Time: 02/15/21 12:58 PM   Specimen: BLOOD LEFT HAND  Result Value Ref Range Status   Specimen Description BLOOD LEFT HAND  Final   Special Requests   Final    BOTTLES DRAWN AEROBIC ONLY Blood Culture results may not be optimal due to an inadequate volume of blood received in culture bottles   Culture   Final    NO GROWTH 2 DAYS Performed at [facility], [place] [address], [place], [place] [address]    Report Status PENDING  Incomplete          Radiology Studies: MR BRAIN WO CONTRAST  Result Date: 02/16/2021 CLINICAL DATA:  Mental status change, unknown cause EXAM: MRI HEAD WITHOUT CONTRAST TECHNIQUE: Multiplanar, multiecho pulse sequences of the brain and surrounding structures were obtained without intravenous contrast. COMPARISON:  August 2018 FINDINGS: Brain: There is no acute infarction or intracranial hemorrhage. There is no intracranial mass, mass effect, or edema. There is no hydrocephalus or extra-axial fluid collection. A few small foci of T2 hyperintensity in the supratentorial white matter are nonspecific but may reflect minor chronic  microvascular ischemic changes. Ventricles and sulci are within normal limits in size and configuration. Vascular: Major vessel flow voids at the skull base are preserved. Skull and upper cervical spine: Normal marrow signal is preserved. Sinuses/Orbits: Paranasal sinuses are aerated. Bilateral lens replacements. Other: Sella is unremarkable.  Mastoid air cells are clear. IMPRESSION: No evidence of recent infarction, hemorrhage, or mass Electronically Signed   By: Macy Mis M.D.   On: 02/16/2021 16:45   DG CHEST PORT 1 VIEW  Result Date: 02/18/2021 CLINICAL DATA:  Chest pain. History of asthma. EXAM: PORTABLE CHEST 1 VIEW COMPARISON:  Chest x-ray and chest CT 02/15/2021 FINDINGS: The cardiac silhouette, mediastinal and hilar contours are stable. Stable marked eventration of the left hemidiaphragm with overlying vascular crowding and atelectasis and small left effusion. Streaky right basilar atelectasis. IMPRESSION: Persistent small left pleural effusion and overlying atelectasis. Electronically Signed   By: Marijo Sanes M.D.   On: 02/18/2021 09:27   EEG adult  Result Date: 02/17/2021 Lora Havens, MD     02/17/2021  9:23 AM Patient Name: OZELL FERRERA MRN: [id number] Epilepsy Attending: Lora Havens Referring Physician/Provider: Dr Hosie Poisson Date: 02/16/2021 Duration: 25.30 mins Patient history: 70 year old female with altered mental status.  EEG to evaluate for seizures. Level of alertness: Awake AEDs during EEG study: None Technical aspects: This EEG study was done with scalp electrodes positioned according to the 10-20 International system of electrode placement. Electrical activity was acquired at a sampling rate of 500Hz  and reviewed with a high frequency filter of 70Hz  and a low frequency filter of 1Hz . EEG data were recorded continuously and digitally stored. Description: No posterior dominant rhythm was seen.  EEG showed continuous generalized 3 to 5 Hz theta-delta slowing.  Generalized periodic discharges with triphasic morphology at 1.5 to 2 Hz were also noted.  Hyperventilation and photic stimulation were not performed.   ABNORMALITY - Periodic discharges with triphasic morphology, generalized ( GPDs) - Continuous slow, generalized IMPRESSION: This study showed generalized periodic discharges with triphasic morphology at 1.5 to 2 Hz which is on the ictal-interictal continuum.  However given the morphology and frequency, is more likely due to underlying toxic-metabolic causes.  Additionally there is evidence of moderate diffuse encephalopathy, nonspecific etiology but likely due to toxic metabolic causes.  No seizures were seen throughout the recording. Priyanka [name]        Scheduled Meds:  atorvastatin  40 mg Oral Daily   busPIRone  5 mg Oral BID   Chlorhexidine Gluconate Cloth  6 each Topical Daily   collagenase   Topical BID   feeding supplement  237 mL Oral BID BM   fluconazole  200 mg Oral Once   ipratropium-albuterol  3 mL Nebulization Once   lactulose  20 g Oral TID   multivitamin with minerals  1 tablet Oral Daily   nutrition supplement (JUVEN)  1 packet Oral BID BM  pantoprazole  40 mg Oral Daily   potassium chloride  40 mEq Oral Once   pyridostigmine  60 mg Oral TID   sodium chloride flush  10-40 mL Intracatheter Q12H   sodium chloride flush  3 mL Intravenous Q12H   Continuous Infusions:  sodium chloride     cefTRIAXone (ROCEPHIN)  IV 2 g (02/17/21 2040)   dextrose 5 % and 0.9% NaCl 100 mL/hr at 02/18/21 0652     LOS: 7 days        Hosie Poisson, MD [facility]   To contact the attending provider between 7A-7P or the covering provider during after hours 7P-7A, please log into the web site [email or web address] and access using universal [place] password for that web site. If you do not have the password, please call the hospital operator.  02/18/2021, 9:41 AM

## 2021-02-18 NOTE — Progress Notes (Addendum)
  Progress Note    02/18/2021 7:44 AM 3 Days Post-Op  Subjective:  very somnolent   Vitals:   02/17/21 2346 02/18/21 0400  BP:  (!) 112/46  Pulse:  70  Resp: 18 18  Temp:  97.6 F (36.4 C)  SpO2:  100%   Physical Exam: Cardiac:  regular Lungs:  non labored Extremities:  left foot dressings clean, dry and intact. Left foot with brisk DP signal, weak PT Neurologic: alert and oriented  CBC    Component Value Date/Time   WBC 15.6 (H) 02/18/2021 0436   RBC 2.31 (L) 02/18/2021 0436   HGB 7.2 (L) 02/18/2021 0436   HCT 21.8 (L) 02/18/2021 0436   PLT 234 02/18/2021 0436   MCV 94.4 02/18/2021 0436   MCH 31.2 02/18/2021 0436   MCHC 33.0 02/18/2021 0436   RDW 17.9 (H) 02/18/2021 0436   LYMPHSABS 0.9 02/15/2021 1730   MONOABS 1.7 (H) 02/15/2021 1730   EOSABS 0.4 02/15/2021 1730   BASOSABS 0.1 02/15/2021 1730    BMET    Component Value Date/Time   NA 142 02/18/2021 0436   K 3.2 (L) 02/18/2021 0436   CL 115 (H) 02/18/2021 0436   CO2 21 (L) 02/18/2021 0436   GLUCOSE 134 (H) 02/18/2021 0436   BUN 32 (H) 02/18/2021 0436   CREATININE 1.17 (H) 02/18/2021 0436   CREATININE 4.23 (H) 10/01/2020 1204   CALCIUM 8.5 (L) 02/18/2021 0436   GFRNONAA 50 (L) 02/18/2021 0436   GFRNONAA 10 (L) 10/01/2020 1204   GFRAA 11 (L) 10/01/2020 1204    INR    Component Value Date/Time   INR 1.1 10/03/2020 0448     Intake/Output Summary (Last 24 hours) at 02/18/2021 0744 Last data filed at 02/18/2021 3818 Gross per 24 hour  Intake 3558.69 ml  Output 450 ml  Net 3108.69 ml     Assessment/Plan:  70 y.o. female is s/p drug-coated balloon angioplasty left tibioperoneal trunk with 4 mm Ranger and stent of left SFA with 6 x 80 mm Elluvia 3 Days Post-Op   Remains very somnolent this morning LLE well perfused with brisk DP and weak PT signals Aspirin, Statin, Plavix Continue local wound care to left heel PT/ OT recommending SNF   Karoline Caldwell, PA-C [facility] [phone] 02/18/2021 7:44 AM  VASCULAR STAFF ADDENDUM: I have independently interviewed and examined the patient. I agree with the above.  Unclear to me why she is so delirious after endovascular procedure with moderate sedation. Maybe lingering effects from fentanyl / versed? Brisk doppler flow in R foot. Neuropathic ulcer on heel improving. Santyl applied.   Yevonne Aline. Stanford Breed, MD [facility] Phone Number: [phone] 02/18/2021 8:52 AM

## 2021-02-19 DIAGNOSIS — E1159 Type 2 diabetes mellitus with other circulatory complications: Secondary | ICD-10-CM | POA: Diagnosis not present

## 2021-02-19 DIAGNOSIS — G4733 Obstructive sleep apnea (adult) (pediatric): Secondary | ICD-10-CM | POA: Diagnosis not present

## 2021-02-19 DIAGNOSIS — D72829 Elevated white blood cell count, unspecified: Secondary | ICD-10-CM | POA: Diagnosis not present

## 2021-02-19 DIAGNOSIS — G7000 Myasthenia gravis without (acute) exacerbation: Secondary | ICD-10-CM | POA: Diagnosis not present

## 2021-02-19 LAB — BASIC METABOLIC PANEL
Anion gap: 4 — ABNORMAL LOW (ref 5–15)
BUN: 24 mg/dL — ABNORMAL HIGH (ref 8–23)
CO2: 19 mmol/L — ABNORMAL LOW (ref 22–32)
Calcium: 7.5 mg/dL — ABNORMAL LOW (ref 8.9–10.3)
Chloride: 120 mmol/L — ABNORMAL HIGH (ref 98–111)
Creatinine, Ser: 1.13 mg/dL — ABNORMAL HIGH (ref 0.44–1.00)
GFR, Estimated: 52 mL/min — ABNORMAL LOW (ref >60)
Glucose, Bld: 454 mg/dL — ABNORMAL HIGH (ref 70–99)
Potassium: 3.1 mmol/L — ABNORMAL LOW (ref 3.5–5.1)
Sodium: 143 mmol/L (ref 135–145)

## 2021-02-19 LAB — GLUCOSE, CAPILLARY
Glucose-Capillary: 101 mg/dL — ABNORMAL HIGH (ref 70–99)
Glucose-Capillary: 104 mg/dL — ABNORMAL HIGH (ref 70–99)
Glucose-Capillary: 114 mg/dL — ABNORMAL HIGH (ref 70–99)
Glucose-Capillary: 131 mg/dL — ABNORMAL HIGH (ref 70–99)

## 2021-02-19 MED ORDER — GUAIFENESIN-DM 100-10 MG/5ML PO SYRP
5.0000 mL | ORAL_SOLUTION | ORAL | Status: DC | PRN
  Administered 2021-02-19: 5 mL via ORAL
  Filled 2021-02-19: qty 5

## 2021-02-19 MED ORDER — POTASSIUM CHLORIDE CRYS ER 20 MEQ PO TBCR
40.0000 meq | EXTENDED_RELEASE_TABLET | Freq: Two times a day (BID) | ORAL | Status: DC
  Administered 2021-02-19 – 2021-02-21 (×5): 40 meq via ORAL
  Filled 2021-02-19 (×5): qty 2

## 2021-02-19 NOTE — Progress Notes (Signed)
Occupational Therapy Treatment Patient Details Name: [name] MRN: [id number] DOB: 25-Aug-1950 Today's Date: 02/19/2021    History of present illness 70 yo admitted 7/21 to [facility] with left heel ulcer and ischemia. Transfer to [facility] 7/22. 7/25 angiography with SFA stent and Left tibioperoneal angioplasty. Pt with AMS/lethargy post procedure with head CT negative. PMhx: GERD, anxiety/depression, HTN, HLD, OSA, gout, lymphedema (self-reported), asthma.   OT comments  Pt progressing gradually towards OT goals, more alert and able to offer more responses to questions. Session focused on promoting independence with self feeding. Pt with limited coordination/ROM due to B UE edema. Pt with difficulty holding regular utensil with improvements noted when using built up foam grips.Pt continues to require up to Max A for self feeding. Assisted in elevating B UE to decrease swelling. Continue to recommend SNF.   Follow Up Recommendations  SNF;Supervision/Assistance - 24 hour    Equipment Recommendations  Hospital bed    Recommendations for Other Services      Precautions / Restrictions Precautions Precautions: Fall Precaution Comments: R forearm blisters and edema Restrictions Weight Bearing Restrictions: No       Mobility Bed Mobility Overal bed mobility: Needs Assistance                  Transfers                      Balance                                           ADL either performed or assessed with clinical judgement   ADL Overall ADL's : Needs assistance/impaired Eating/Feeding: Bed level;Maximal assistance Eating/Feeding Details (indicate cue type and reason): Overall Max A for self feeding. Assist to scoop, bring to mouth and hold to regular utensils.Improved ability to grasp utensils with built up grips though requires consistent cues to hold to utensil (at times, expecting therapist to do task for pt). Able to hold to cup with Min A  for straw mgmt. Grooming: Moderate assistance;Bed level;Wash/dry hands Grooming Details (indicate cue type and reason): Mod A overall to use santizing wipe on B hand prior to eating lunch                               General ADL Comments: Improved responses and alertness though continues to require extensive assist for basic ADLs (self feeding). Also limited by B UE coordination/ROM secondary to swelling     Vision   Vision Assessment?: No apparent visual deficits   Perception     Praxis      Cognition Arousal/Alertness: Awake/alert Behavior During Therapy: Flat affect Overall Cognitive Status: Impaired/Different from baseline Area of Impairment: Orientation;Attention;Following commands;Awareness;Safety/judgement;Problem solving                 Orientation Level: Disoriented to;Situation;Time Current Attention Level: Selective   Following Commands: Follows one step commands with increased time Safety/Judgement: Decreased awareness of safety;Decreased awareness of deficits Awareness: Intellectual Problem Solving: Slow processing;Difficulty sequencing;Requires verbal cues;Requires tactile cues General Comments: Pt alert, following most simple 1-step commands with increased time to process/respond to cues. Pt with improved ability to respond to questions appropriately. very slow motor plannng, processing vs self limiting behaviors for ADLs.        Exercises Exercises: Other exercises Other Exercises Other  Exercises: hand pumps to decrease hand swelling   Shoulder Instructions       General Comments Propped BUE up on pillows to decrease swelling, could use more pillows for improved positioning    Pertinent Vitals/ Pain       Pain Assessment: No/denies pain Pain Intervention(s): Monitored during session  Home Living                                          Prior Functioning/Environment              Frequency  Min 2X/week         Progress Toward Goals  OT Goals(current goals can now be found in the care plan section)  Progress towards OT goals: Progressing toward goals  Acute Rehab OT Goals Patient Stated Goal: none stated today OT Goal Formulation: Patient unable to participate in goal setting Time For Goal Achievement: 03/02/21 Potential to Achieve Goals: Fair ADL Goals Pt Will Perform Grooming: with min assist;sitting Pt Will Perform Upper Body Bathing: with mod assist;sitting Pt Will Perform Lower Body Bathing: with max assist;sit to/from stand;sitting/lateral leans Additional ADL Goal #1: Pt to demo bed mobility with Min A in prep for ADL transfers  Plan Discharge plan remains appropriate;Frequency remains appropriate    Co-evaluation                 AM-PAC OT "6 Clicks" Daily Activity     Outcome Measure   Help from another person eating meals?: A Lot Help from another person taking care of personal grooming?: A Lot Help from another person toileting, which includes using toliet, bedpan, or urinal?: Total Help from another person bathing (including washing, rinsing, drying)?: Total Help from another person to put on and taking off regular upper body clothing?: Total Help from another person to put on and taking off regular lower body clothing?: Total 6 Click Score: 8    End of Session    OT Visit Diagnosis: Unsteadiness on feet (R26.81);Other abnormalities of gait and mobility (R26.89);Muscle weakness (generalized) (M62.81);Other symptoms and signs involving cognitive function   Activity Tolerance Patient tolerated treatment well   Patient Left in bed;with call bell/phone within reach;with bed alarm set   Nurse Communication Mobility status;Other (comment) (built up grips for utensils)        Time: 1230-1301 OT Time Calculation (min): 31 min  Charges: OT General Charges $OT Visit: 1 Visit OT Treatments $Self Care/Home Management : 23-37 mins  Malachy Chamber, OTR/L Acute  Rehab Services Office: [phone]    Layla Maw 02/19/2021, 1:16 PM

## 2021-02-19 NOTE — Progress Notes (Signed)
PROGRESS NOTE    [name]  PZW:[id number] DOB: [birth year] DOA: 02/11/2021 PCP: Leslie Andrea, MD    Chief Complaint  Patient presents with   Leg Pain    Brief Narrative:  70 y/o female sent to ED from wound care center for worsening of L foot wound. There was concern for underlying osteomyelitis since per outpatient provider, wound could be probed to the bone. MRI foot did not show any osteomyelitis or abscess. ABI were 0.75, but likely falsely elevated. Since here wound was not healing despite wound care and antibiotics, she as seen by vascular and underwent aortogram with bilateral lower extremity runoff, s/p balloon angioplasty left tibioperoneal trunk and placement of a stent of left SFA. Patient bcame somnolent post procedure and  confused.  Initial CT of the head is negative for acute stroke.  MRI of the brain without contrast  negative for acute stroke.   Pt seen and examined at bedside.  Diarrhea today .  Assessment & Plan:   Active Problems:   HLD (hyperlipidemia)   OBSTRUCTIVE SLEEP APNEA   Ocular myasthenia gravis (HCC)   Hypomagnesemia   Social problem   Open wound of plantar aspect of foot   DM2 (diabetes mellitus, type 2) (HCC)   Nonhealing left heel wound and cellulitis On admission patient was found to have purulent discharge and foul-smelling from the left heel wound. She recently completed treatment with ciprofloxacin for Serratia isolated from the wound cultures. Repeat wound cultures are pending, rare gram positive cocci seen. Further identification and sensitivities are pending.  MRI of the foot does not show any osteomyelitis or abscess Vascular surgery consulted and underwent aortogram, balloon angioplasty and left SFA stent. Started her on aspirin, plavix and continue with statin. Currently waiting for SNF placement.     Acute encephalopathy of unclear etiology/ metabolic encephalopathy.  Differentials include lethargy from moderate  sedation vs  encephalopathy from elevated ammonia levels. EEG does not show any acute abnormality, TSH wnl, vitamin b12 levels are wnl.  UA is slightly abnormal , but pt is already on IV cefepime transitioned to IV rocephin. .  Ammonia levels elevated, added lactulose.  Initial CT head negative, follow up with MRI of the brain does not show any acute stroke.  Neurology consulted, no further recommendations at this time.  Pt alert and oriented and able to hold a conversation.   Type 2 diabetes mellitus A1c is 4.9 Continue with sliding scale insulin. CBG (last 3)  Recent Labs    02/19/21 0624 02/19/21 1226 02/19/21 1601  GLUCAP 101* 104* 114*   No changes in meds.     Lymphedema   Mild AKI With metabolic acidosis:  Suspect from dehydration, hypotension.  Renal parameters have improved.    Hypokalemia:  Replaced.    Diarrhea:  Probably from lactulose.  Which was discontinued.    Obstructive sleep apnea Continue with CPAP   Ocular myasthenia gravis Patient on pyridostigmine  Hyperlipidemia Resume statin.    Deconditioning and generalized weakness:  Therapy evaluations recommending SNF.    Upper extremity edema Venous duplex of the  UE will be ordered.    DVT prophylaxis: Heparin Code Status: Full code Family Communication: (None at bedside.) Disposition:   Status is: Inpatient  Remains inpatient appropriate because:Ongoing diagnostic testing needed not appropriate for outpatient work up, Unsafe d/c plan, and IV treatments appropriate due to intensity of illness or inability to take PO  Dispo: The patient is from: Home  Anticipated d/c is to: SNF              Patient currently is not medically stable to d/c.   Difficult to place patient No       Consultants:  Vascular surgery  Procedures: MRI of the brain Aortogram Antimicrobials:  Antibiotics Given (last 72 hours)     Date/Time Action Medication Dose Rate   02/16/21 2132 New  Bag/Given   cefTRIAXone (ROCEPHIN) 2 g in sodium chloride 0.9 % 100 mL IVPB 2 g 200 mL/hr   02/17/21 2040 New Bag/Given   cefTRIAXone (ROCEPHIN) 2 g in sodium chloride 0.9 % 100 mL IVPB 2 g 200 mL/hr   02/18/21 2005 New Bag/Given   cefTRIAXone (ROCEPHIN) 2 g in sodium chloride 0.9 % 100 mL IVPB 2 g 200 mL/hr         Subjective: Alert and comfortable.   Objective: Vitals:   02/19/21 0318 02/19/21 0826 02/19/21 1223 02/19/21 1550  BP: (!) 118/44 (!) 118/53 (!) 116/49 (!) 121/48  Pulse: 72 84 89 89  Resp: 16 17 18 19   Temp: (!) 97.4 F (36.3 C) 97.6 F (36.4 C) 97.7 F (36.5 C) 97.8 F (36.6 C)  TempSrc: Axillary Axillary Oral Oral  SpO2: 100% 100% 100% 100%  Weight:      Height:        Intake/Output Summary (Last 24 hours) at 02/19/2021 1652 Last data filed at 02/19/2021 1500 Gross per 24 hour  Intake 3851.47 ml  Output 600 ml  Net 3251.47 ml    Filed Weights   02/11/21 1909 02/16/21 0353 02/17/21 0300  Weight: 108.4 kg 105.9 kg 106.4 kg    Examination:  General exam: Elderly woman,  not in distress.  Respiratory system: air entry fair, on RA. No wheezing heard.  Cardiovascular system: RRR, pedal edema present.  Gastrointestinal system: Abdomen is soft, NT ND BS+ Central nervous system: alert and oriented, non focal  Extremities: upper extremity edema.  Skin: left heel wound.  Psychiatry:  cannot be assessed.     Data Reviewed: I have personally reviewed following labs and imaging studies  CBC: Recent Labs  Lab 02/14/21 0124 02/15/21 0755 02/15/21 1730 02/16/21 0959 02/18/21 0436  WBC 15.1* 28.4* 27.0* 19.0* 15.6*  NEUTROABS  --   --  23.7*  --   --   HGB 8.6* 8.7* 8.9* 7.9* 7.2*  HCT 25.4* 25.7* 25.3* 24.0* 21.8*  MCV 93.0 92.1 91.0 94.5 94.4  PLT 302 312 323 261 234     Basic Metabolic Panel: Recent Labs  Lab 02/15/21 0755 02/16/21 0959 02/17/21 0431 02/18/21 0436 02/19/21 0440  NA 138 138 141 142 143  K 3.9 3.9 3.5 3.2* 3.1*  CL  110 114* 116* 115* 120*  CO2 21* 18* 22 21* 19*  GLUCOSE 77 95 93 134* 454*  BUN 24* 30* 31* 32* 24*  CREATININE 1.18*  1.14* 1.39* 1.25* 1.17* 1.13*  CALCIUM 8.5* 8.3* 8.5* 8.5* 7.5*  PHOS 2.7  --   --   --   --      GFR: Estimated Creatinine Clearance: 55.4 mL/min (A) (by C-G formula based on SCr of 1.13 mg/dL (H)).  Liver Function Tests: Recent Labs  Lab 02/15/21 0755  ALBUMIN 1.9*     CBG: Recent Labs  Lab 02/18/21 1130 02/18/21 2104 02/19/21 0624 02/19/21 1226 02/19/21 1601  GLUCAP 95 147* 101* 104* 114*      Recent Results (from the past 240 hour(s))  Culture, blood (routine  x 2)     Status: None   Collection Time: 02/11/21  2:37 PM   Specimen: Right Antecubital; Blood  Result Value Ref Range Status   Specimen Description   Final    RIGHT ANTECUBITAL BOTTLES DRAWN AEROBIC AND ANAEROBIC   Special Requests Blood Culture adequate volume  Final   Culture   Final    NO GROWTH 5 DAYS Performed at [facility], [address], [place], [place] [address]    Report Status 02/16/2021 FINAL  Final  Resp Panel by RT-PCR (Flu A&B, Covid) Nasopharyngeal Swab     Status: None   Collection Time: 02/11/21  3:31 PM   Specimen: Nasopharyngeal Swab; Nasopharyngeal(NP) swabs in vial transport medium  Result Value Ref Range Status   SARS Coronavirus 2 by RT PCR NEGATIVE NEGATIVE Final    Comment: (NOTE) SARS-CoV-2 target nucleic acids are NOT DETECTED.  The SARS-CoV-2 RNA is generally detectable in upper respiratory specimens during the acute phase of infection. The lowest concentration of SARS-CoV-2 viral copies this assay can detect is 138 copies/mL. A negative result does not preclude SARS-Cov-2 infection and should not be used as the sole basis for treatment or other patient management decisions. A negative result may occur with  improper specimen collection/handling, submission of specimen other than nasopharyngeal swab, presence of viral mutation(s) within  the areas targeted by this assay, and inadequate number of viral copies(<138 copies/mL). A negative result must be combined with clinical observations, patient history, and epidemiological information. The expected result is Negative.  Fact Sheet for Patients:  EntrepreneurPulse.com.au  Fact Sheet for Healthcare Providers:  IncredibleEmployment.be  This test is no t yet approved or cleared by the [place] FDA and  has been authorized for detection and/or diagnosis of SARS-CoV-2 by FDA under an Emergency Use Authorization (EUA). This EUA will remain  in effect (meaning this test can be used) for the duration of the COVID-19 declaration under Section 564(b)(1) of the Act, 21 U.S.C.section 360bbb-3(b)(1), unless the authorization is terminated  or revoked sooner.       Influenza A by PCR NEGATIVE NEGATIVE Final   Influenza B by PCR NEGATIVE NEGATIVE Final    Comment: (NOTE) The Xpert Xpress SARS-CoV-2/FLU/RSV plus assay is intended as an aid in the diagnosis of influenza from Nasopharyngeal swab specimens and should not be used as a sole basis for treatment. Nasal washings and aspirates are unacceptable for Xpert Xpress SARS-CoV-2/FLU/RSV testing.  Fact Sheet for Patients: EntrepreneurPulse.com.au  Fact Sheet for Healthcare Providers: IncredibleEmployment.be  This test is not yet approved or cleared by the [place] FDA and has been authorized for detection and/or diagnosis of SARS-CoV-2 by FDA under an Emergency Use Authorization (EUA). This EUA will remain in effect (meaning this test can be used) for the duration of the COVID-19 declaration under Section 564(b)(1) of the Act, 21 U.S.C. section 360bbb-3(b)(1), unless the authorization is terminated or revoked.  Performed at [facility], [address] [place], [place] [address]   Culture, blood (routine x 2)     Status: None   Collection  Time: 02/11/21  3:54 PM   Specimen: Left Antecubital; Blood  Result Value Ref Range Status   Specimen Description   Final    LEFT ANTECUBITAL BOTTLES DRAWN AEROBIC AND ANAEROBIC   Special Requests Blood Culture adequate volume  Final   Culture   Final    NO GROWTH 5 DAYS Performed at [facility], [address], [place], [place] [address]    Report  Status 02/16/2021 FINAL  Final  Aerobic Culture w Gram Stain (superficial specimen)     Status: None   Collection Time: 02/13/21 12:13 PM   Specimen: Wound  Result Value Ref Range Status   Specimen Description WOUND  Final   Special Requests FOOT LEFT  Final   Gram Stain   Final    NO WBC SEEN MODERATE GRAM VARIABLE ROD RARE GRAM POSITIVE COCCI IN PAIRS Performed at [facility], [place] [address] [place], [place] [address]    Culture   Final    FEW MORAXELLA SPECIES BETA LACTAMASE NEGATIVE FEW PROVIDENCIA STUARTII    Report Status 02/18/2021 FINAL  Final   Organism ID, Bacteria PROVIDENCIA STUARTII  Final      Susceptibility   Providencia stuartii - MIC*    AMPICILLIN >=32 RESISTANT Resistant     CEFAZOLIN >=64 RESISTANT Resistant     CEFEPIME <=0.12 SENSITIVE Sensitive     CEFTAZIDIME <=1 SENSITIVE Sensitive     CEFTRIAXONE <=0.25 SENSITIVE Sensitive     CIPROFLOXACIN >=4 RESISTANT Resistant     GENTAMICIN RESISTANT Resistant     IMIPENEM 0.5 SENSITIVE Sensitive     TRIMETH/SULFA <=20 SENSITIVE Sensitive     AMPICILLIN/SULBACTAM >=32 RESISTANT Resistant     PIP/TAZO 8 SENSITIVE Sensitive     * FEW PROVIDENCIA STUARTII  Surgical pcr screen     Status: None   Collection Time: 02/14/21 10:06 PM   Specimen: Nasal Mucosa; Nasal Swab  Result Value Ref Range Status   MRSA, PCR NEGATIVE NEGATIVE Final   Staphylococcus aureus NEGATIVE NEGATIVE Final    Comment: (NOTE) The Xpert SA Assay (FDA approved for NASAL specimens in patients 62 years of age and older), is one component of a comprehensive surveillance program. It is  not intended to diagnose infection nor to guide or monitor treatment. Performed at [facility], [place] [address], [place], [place] [address]   Urine Culture     Status: Abnormal   Collection Time: 02/15/21  2:04 AM   Specimen: Urine, Catheterized  Result Value Ref Range Status   Specimen Description URINE, CATHETERIZED  Final   Special Requests   Final    NONE Performed at [facility], [place] [address] [place], [place] [address]    Culture 30,000 COLONIES/mL YEAST (A)  Final   Report Status 02/17/2021 FINAL  Final  Culture, blood (routine x 2)     Status: None (Preliminary result)   Collection Time: 02/15/21 12:49 PM   Specimen: BLOOD  Result Value Ref Range Status   Specimen Description BLOOD RIGHT ANTECUBITAL  Final   Special Requests   Final    BOTTLES DRAWN AEROBIC ONLY Blood Culture adequate volume   Culture   Final    NO GROWTH 3 DAYS Performed at [facility], [place] [address] [place], [place] [address]    Report Status PENDING  Incomplete  Culture, blood (routine x 2)     Status: None (Preliminary result)   Collection Time: 02/15/21 12:58 PM   Specimen: BLOOD LEFT HAND  Result Value Ref Range Status   Specimen Description BLOOD LEFT HAND  Final   Special Requests   Final    BOTTLES DRAWN AEROBIC ONLY Blood Culture results may not be optimal due to an inadequate volume of blood received in culture bottles   Culture   Final    NO GROWTH 3 DAYS Performed at [facility], [place],  [place] [address]    Report Status PENDING  Incomplete          Radiology Studies: DG CHEST PORT 1 VIEW  Result Date: 02/18/2021 CLINICAL DATA:  Chest pain. History of asthma. EXAM: PORTABLE CHEST 1 VIEW COMPARISON:  Chest x-ray and chest CT 02/15/2021 FINDINGS: The cardiac silhouette, mediastinal and hilar contours are stable. Stable marked eventration of the left hemidiaphragm with overlying vascular crowding and atelectasis and small left  effusion. Streaky right basilar atelectasis. IMPRESSION: Persistent small left pleural effusion and overlying atelectasis. Electronically Signed   By: Marijo Sanes M.D.   On: 02/18/2021 09:27        Scheduled Meds:  aspirin EC  81 mg Oral Daily   atorvastatin  40 mg Oral Daily   busPIRone  5 mg Oral BID   Chlorhexidine Gluconate Cloth  6 each Topical Daily   clopidogrel  75 mg Oral Daily   collagenase   Topical BID   feeding supplement  237 mL Oral BID BM   multivitamin with minerals  1 tablet Oral Daily   pantoprazole  40 mg Oral Daily   potassium chloride  40 mEq Oral BID   pyridostigmine  60 mg Oral TID   sodium chloride flush  10-40 mL Intracatheter Q12H   sodium chloride flush  3 mL Intravenous Q12H   Continuous Infusions:  sodium chloride     cefTRIAXone (ROCEPHIN)  IV Stopped (02/18/21 2035)   dextrose 5 % and 0.9% NaCl 100 mL/hr at 02/19/21 0827     LOS: 8 days        Hosie Poisson, MD [facility]   To contact the attending provider between 7A-7P or the covering provider during after hours 7P-7A, please log into the web site [email or web address] and access using universal [place] password for that web site. If you do not have the password, please call the hospital operator.  02/19/2021, 4:52 PM

## 2021-02-19 NOTE — Progress Notes (Signed)
  Progress Note    02/19/2021 9:22 AM 4 Days Post-Op  Subjective:  More alert and oriented this morning.  Able to hold a conversation   Vitals:   02/19/21 0318 02/19/21 0826  BP: (!) 118/44 (!) 118/53  Pulse: 72 84  Resp: 16 17  Temp: (!) 97.4 F (36.3 C) 97.6 F (36.4 C)  SpO2: 100% 100%   Physical Exam: Lungs:  non labored Incisions:  R groin without hematoma Extremities:  L PTA and ATA brisk by doppler; dressing L heel left in place Neurologic: A&O  CBC    Component Value Date/Time   WBC 15.6 (H) 02/18/2021 0436   RBC 2.31 (L) 02/18/2021 0436   HGB 7.2 (L) 02/18/2021 0436   HCT 21.8 (L) 02/18/2021 0436   PLT 234 02/18/2021 0436   MCV 94.4 02/18/2021 0436   MCH 31.2 02/18/2021 0436   MCHC 33.0 02/18/2021 0436   RDW 17.9 (H) 02/18/2021 0436   LYMPHSABS 0.9 02/15/2021 1730   MONOABS 1.7 (H) 02/15/2021 1730   EOSABS 0.4 02/15/2021 1730   BASOSABS 0.1 02/15/2021 1730    BMET    Component Value Date/Time   NA 143 02/19/2021 0440   K 3.1 (L) 02/19/2021 0440   CL 120 (H) 02/19/2021 0440   CO2 19 (L) 02/19/2021 0440   GLUCOSE 454 (H) 02/19/2021 0440   BUN 24 (H) 02/19/2021 0440   CREATININE 1.13 (H) 02/19/2021 0440   CREATININE 4.23 (H) 10/01/2020 1204   CALCIUM 7.5 (L) 02/19/2021 0440   GFRNONAA 52 (L) 02/19/2021 0440   GFRNONAA 10 (L) 10/01/2020 1204   GFRAA 11 (L) 10/01/2020 1204    INR    Component Value Date/Time   INR 1.1 10/03/2020 0448     Intake/Output Summary (Last 24 hours) at 02/19/2021 6283 Last data filed at 02/19/2021 0827 Gross per 24 hour  Intake 3791.47 ml  Output --  Net 3791.47 ml     Assessment/Plan:  70 y.o. female is s/p L SFA stent and DCB of tp trunk  4 Days Post-Op   Patient is more alert and able to hold a conversation this morning L foot well perfused with brisk PT and AT signals by doppler Encouraged patient to offload L heel TOC arranged SNF placement Call vascular surgeon on call if questions over the  weekend    Dagoberto Ligas, PA-C [facility] [phone] 02/19/2021 9:22 AM

## 2021-02-19 NOTE — Progress Notes (Signed)
RT placed patient on CPAP with 2L O2 bled into circuit. Patient tolerating settings well at this time. RT will monitor as needed. 

## 2021-02-19 NOTE — Progress Notes (Signed)
  Speech Language Pathology Treatment: Dysphagia  Patient Details Name: [name] MRN: [id number] DOB: 05/14/1951 Today's Date: 02/19/2021 Time: 5041-3643 SLP Time Calculation (min) (ACUTE ONLY): 14 min  Assessment / Plan / Recommendation Clinical Impression  Pt's mentation has improved since initial evaluation, although she continues to have prolonged mastication and mild lingual residue with regular solids. Pt did also have intermittent coughing with regular trials today, reporting that they were "dry." SLP tried to intervene by providing smaller bites and cues for liquid washes, but pt is also a little impulsive with her liquid consumption which also resulted in coughing on mixed consistencies. Would continue with Dys 2 diet for now with ongoing SLP f/u for potential to advance as pt reports eating more solid foods at home.    HPI HPI: 70 yo admitted 7/21 to [facility] with left heel ulcer and ischemia. Transfer to [facility] 7/22. 7/25 angiography with SFA stent and Left tibioperoneal angioplasty. Pt with AMS post procedure with head CT and MRI negative.  PMhx: GERD, anxiety/depression, HTN, HLD, OSA, gout, asthma      SLP Plan  Continue with current plan of care       Recommendations  Diet recommendations: Dysphagia 2 (fine chop);Thin liquid Liquids provided via: Cup;Straw Medication Administration: Crushed with puree Supervision: Staff to assist with self feeding Compensations: Minimize environmental distractions;Slow rate;Small sips/bites Postural Changes and/or Swallow Maneuvers: Seated upright 90 degrees                Oral Care Recommendations: Oral care BID Follow up Recommendations: Skilled Nursing facility SLP Visit Diagnosis: Dysphagia, oral phase (R13.11) Plan: Continue with current plan of care       GO                Osie Bond., M.A. [facility] Pager [phone] Office [phone]  02/19/2021, 1:27 PM

## 2021-02-20 ENCOUNTER — Inpatient Hospital Stay (HOSPITAL_COMMUNITY): Payer: Medicare Other

## 2021-02-20 DIAGNOSIS — G7000 Myasthenia gravis without (acute) exacerbation: Secondary | ICD-10-CM | POA: Diagnosis not present

## 2021-02-20 DIAGNOSIS — G4733 Obstructive sleep apnea (adult) (pediatric): Secondary | ICD-10-CM | POA: Diagnosis not present

## 2021-02-20 DIAGNOSIS — E1159 Type 2 diabetes mellitus with other circulatory complications: Secondary | ICD-10-CM | POA: Diagnosis not present

## 2021-02-20 DIAGNOSIS — D72829 Elevated white blood cell count, unspecified: Secondary | ICD-10-CM | POA: Diagnosis not present

## 2021-02-20 DIAGNOSIS — R609 Edema, unspecified: Secondary | ICD-10-CM

## 2021-02-20 LAB — CBC WITH DIFFERENTIAL/PLATELET
Abs Immature Granulocytes: 0.27 10*3/uL — ABNORMAL HIGH (ref 0.00–0.07)
Basophils Absolute: 0.1 10*3/uL (ref 0.0–0.1)
Basophils Relative: 1 %
Eosinophils Absolute: 0.7 10*3/uL — ABNORMAL HIGH (ref 0.0–0.5)
Eosinophils Relative: 6 %
HCT: 21.9 % — ABNORMAL LOW (ref 36.0–46.0)
Hemoglobin: 7.2 g/dL — ABNORMAL LOW (ref 12.0–15.0)
Immature Granulocytes: 2 %
Lymphocytes Relative: 16 %
Lymphs Abs: 2.1 10*3/uL (ref 0.7–4.0)
MCH: 32 pg (ref 26.0–34.0)
MCHC: 32.9 g/dL (ref 30.0–36.0)
MCV: 97.3 fL (ref 80.0–100.0)
Monocytes Absolute: 1.2 10*3/uL — ABNORMAL HIGH (ref 0.1–1.0)
Monocytes Relative: 9 %
Neutro Abs: 8.8 10*3/uL — ABNORMAL HIGH (ref 1.7–7.7)
Neutrophils Relative %: 66 %
Platelets: 251 10*3/uL (ref 150–400)
RBC: 2.25 MIL/uL — ABNORMAL LOW (ref 3.87–5.11)
RDW: 18 % — ABNORMAL HIGH (ref 11.5–15.5)
WBC: 13.1 10*3/uL — ABNORMAL HIGH (ref 4.0–10.5)
nRBC: 0.8 % — ABNORMAL HIGH (ref 0.0–0.2)

## 2021-02-20 LAB — CULTURE, BLOOD (ROUTINE X 2)
Culture: NO GROWTH
Culture: NO GROWTH
Special Requests: ADEQUATE

## 2021-02-20 LAB — BASIC METABOLIC PANEL
Anion gap: 6 (ref 5–15)
BUN: 21 mg/dL (ref 8–23)
CO2: 20 mmol/L — ABNORMAL LOW (ref 22–32)
Calcium: 8.1 mg/dL — ABNORMAL LOW (ref 8.9–10.3)
Chloride: 118 mmol/L — ABNORMAL HIGH (ref 98–111)
Creatinine, Ser: 1.27 mg/dL — ABNORMAL HIGH (ref 0.44–1.00)
GFR, Estimated: 45 mL/min — ABNORMAL LOW (ref >60)
Glucose, Bld: 86 mg/dL (ref 70–99)
Potassium: 4.4 mmol/L (ref 3.5–5.1)
Sodium: 144 mmol/L (ref 135–145)

## 2021-02-20 LAB — PREPARE RBC (CROSSMATCH)

## 2021-02-20 LAB — ABO/RH: ABO/RH(D): O POS

## 2021-02-20 LAB — GLUCOSE, CAPILLARY
Glucose-Capillary: 89 mg/dL (ref 70–99)
Glucose-Capillary: 87 mg/dL (ref 70–99)
Glucose-Capillary: 121 mg/dL — ABNORMAL HIGH (ref 70–99)
Glucose-Capillary: 106 mg/dL — ABNORMAL HIGH (ref 70–99)

## 2021-02-20 MED ORDER — FUROSEMIDE 10 MG/ML IJ SOLN
20.0000 mg | Freq: Once | INTRAMUSCULAR | Status: AC
  Administered 2021-02-20: 20 mg via INTRAVENOUS
  Filled 2021-02-20: qty 2

## 2021-02-20 MED ORDER — GUAIFENESIN-DM 100-10 MG/5ML PO SYRP
10.0000 mL | ORAL_SOLUTION | ORAL | Status: DC | PRN
  Administered 2021-02-20 – 2021-02-23 (×5): 10 mL via ORAL
  Filled 2021-02-20 (×6): qty 10

## 2021-02-20 MED ORDER — FUROSEMIDE 20 MG PO TABS: 20.0000 mg | ORAL_TABLET | Freq: Every day | ORAL | Status: DC

## 2021-02-20 MED ORDER — SODIUM CHLORIDE 0.9% IV SOLUTION
Freq: Once | INTRAVENOUS | Status: AC
  Administered 2021-02-20: 10 mL/h via INTRAVENOUS

## 2021-02-20 NOTE — TOC Progression Note (Signed)
Transition of Care [facility]) - Progression Note    Patient Details  Name: [name] MRN: [id number] Date of Birth: 1951/02/05  Transition of [facility]) CM/SW [place], LCSW Phone Number: [phone] 02/20/2021, 12:28 PM  Clinical Narrative:    CSW briefly spoke with DIL Tameka at bed offers however she was at work and asked CSW to call back after 3pm today.  TOC team will continue to assist with discharge planning needs.    Expected Discharge Plan: Skilled Nursing Facility Barriers to Discharge: Ship broker, Continued Medical Work up, SNF Pending bed offer  Expected Discharge Plan and Services Expected Discharge Plan: [place] In-house Referral: Clinical Social Work Discharge Planning Services: CM Consult   Living arrangements for the past 2 months: Single Family Home                                       Social Determinants of Health (SDOH) Interventions    Readmission Risk Interventions Readmission Risk Prevention Plan 02/12/2021 10/20/2020 10/04/2020  Transportation Screening Complete Complete Complete  PCP or Specialist Appt within 5-7 Days - - Complete  Home Care Screening - - Complete  Medication Review (RN CM) - - Complete  HRI or [place] - Complete -  Social Work Consult for [place] Planning/Counseling - Complete -  Palliative Care Screening - Not Applicable -  Medication Review Press photographer) Complete Complete -  [place] or Home Care Consult Complete - -  SW Recovery Care/Counseling Consult Complete - -  Palliative Care Screening Not Applicable - -  [place] Not Applicable - -  Some recent data might be hidden

## 2021-02-20 NOTE — Progress Notes (Signed)
PROGRESS NOTE    [name]  ASN:[id number] DOB: May 09, 1951 DOA: 02/11/2021 PCP: Leslie Andrea, MD    Chief Complaint  Patient presents with   Leg Pain    Brief Narrative:  70 y/o female sent to ED from wound care center for worsening of L foot wound. There was concern for underlying osteomyelitis since per outpatient provider, wound could be probed to the bone. MRI foot did not show any osteomyelitis or abscess. ABI were 0.75, but likely falsely elevated. Since here wound was not healing despite wound care and antibiotics, she as seen by vascular and underwent aortogram with bilateral lower extremity runoff, s/p balloon angioplasty left tibioperoneal trunk and placement of a stent of left SFA. Patient bcame somnolent post procedure and  confused.  Initial CT of the head is negative for acute stroke.  MRI of the brain without contrast  negative for acute stroke.  Pt seen and examined, she is coughing , reports being congested. .   Assessment & Plan:   Active Problems:   HLD (hyperlipidemia)   OBSTRUCTIVE SLEEP APNEA   Ocular myasthenia gravis (HCC)   Hypomagnesemia   Social problem   Open wound of plantar aspect of foot   DM2 (diabetes mellitus, type 2) (HCC)   Nonhealing left heel wound and cellulitis On admission patient was found to have purulent discharge and foul-smelling from the left heel wound. She recently completed treatment with ciprofloxacin for Serratia isolated from the wound cultures. Repeat wound cultures show Providential sensitive to rocephin.  MRI of the foot does not show any osteomyelitis or abscess Vascular surgery consulted and she underwent aortogram, balloon angioplasty and left SFA stent. Started her on aspirin, plavix and continue with statin. Currently waiting for SNF placement.     Acute encephalopathy of unclear etiology/ metabolic encephalopathy.  Differentials include lethargy from moderate sedation vs  encephalopathy from elevated  ammonia levels. EEG does not show any acute abnormality, TSH wnl, vitamin b12 levels are wnl.  UA is slightly abnormal , but pt is already on IV cefepime transitioned to IV rocephin. .  Ammonia levels elevated, added lactulose.  Initial CT head negative, follow up with MRI of the brain does not show any acute stroke.  Neurology consulted, no further recommendations at this time.  Today pt is alert and oriented. Grossly non focal.  Type 2 diabetes mellitus A1c is 4.9 Continue with sliding scale insulin. CBG (last 3)  Recent Labs    02/20/21 0628 02/20/21 1153 02/20/21 1641  GLUCAP 89 87 121*   No changes in meds.     Lymphedema Worsening, with bilateral upper extremity edema.  She appears to be fluid overloaded.  Will give he a dose of lasix 20 mg IV. At home pt is on 40 mg  of lasix daily . Cxr does not show any pneumonia or pleural effusion or CHF.     Mild AKI With metabolic acidosis:  Renal parameters have improved.    Hypokalemia:  Replaced.    Obstructive sleep apnea Continue with CPAP   Ocular myasthenia gravis Patient on pyridostigmine  Hyperlipidemia Resume statin.    Deconditioning and generalized weakness:  Therapy evaluations recommending SNF.    Upper extremity edema Venous duplex of the  UE ordered and is negative for DVT.    Loose stools:  D/ced lactulose.    DVT prophylaxis: Heparin Code Status: Full code Family Communication: (None at bedside.) Disposition:   Status is: Inpatient  Remains inpatient appropriate because:Ongoing diagnostic testing needed  not appropriate for outpatient work up, Unsafe d/c plan, and IV treatments appropriate due to intensity of illness or inability to take PO  Dispo: The patient is from: Home              Anticipated d/c is to: SNF              Patient currently is not medically stable to d/c.   Difficult to place patient No       Consultants:  Vascular surgery  Procedures: MRI of the  brain Aortogram Antimicrobials:  Antibiotics Given (last 72 hours)     Date/Time Action Medication Dose Rate   02/17/21 2040 New Bag/Given   cefTRIAXone (ROCEPHIN) 2 g in sodium chloride 0.9 % 100 mL IVPB 2 g 200 mL/hr   02/18/21 2005 New Bag/Given   cefTRIAXone (ROCEPHIN) 2 g in sodium chloride 0.9 % 100 mL IVPB 2 g 200 mL/hr   02/19/21 2138 New Bag/Given   cefTRIAXone (ROCEPHIN) 2 g in sodium chloride 0.9 % 100 mL IVPB 2 g 200 mL/hr         Subjective: Coughing a little. CXR   Is clear .    Objective: Vitals:   02/20/21 0341 02/20/21 0816 02/20/21 1154 02/20/21 1633  BP: (!) 114/37 (!) 117/45 (!) 105/46 (!) 102/52  Pulse: 78 91 93 85  Resp: 17 20 18 18   Temp: 98.3 F (36.8 C) 97.8 F (36.6 C) 98.4 F (36.9 C) 98.1 F (36.7 C)  TempSrc: Axillary Oral Oral Oral  SpO2: 99% 100% 100% 100%  Weight:      Height:        Intake/Output Summary (Last 24 hours) at 02/20/2021 1828 Last data filed at 02/20/2021 1700 Gross per 24 hour  Intake 785.33 ml  Output 900 ml  Net -114.67 ml    Filed Weights   02/11/21 1909 02/16/21 0353 02/17/21 0300  Weight: 108.4 kg 105.9 kg 106.4 kg    Examination:  General exam: Elderly woman, on 2 lit of NCo xygen.  Respiratory system: diminished air entry at bases, on 2l it of [place] oxygen.  Cardiovascular system: S1S2 heard , RRR, no JVD, pedal edema, upper extremity edema. Gastrointestinal system: Abdomen is soft, non tender non distended. Bowel sounds wnl.  Central nervous system: Alert and non focal.  Extremities: upper extremity edema bilateral.  Skin: left heel wound.  Psychiatry:  mood is appropriate.     Data Reviewed: I have personally reviewed following labs and imaging studies  CBC: Recent Labs  Lab 02/15/21 0755 02/15/21 1730 02/16/21 0959 02/18/21 0436 02/20/21 1043  WBC 28.4* 27.0* 19.0* 15.6* 13.1*  NEUTROABS  --  23.7*  --   --  8.8*  HGB 8.7* 8.9* 7.9* 7.2* 7.2*  HCT 25.7* 25.3* 24.0* 21.8* 21.9*  MCV 92.1  91.0 94.5 94.4 97.3  PLT 312 323 261 234 251     Basic Metabolic Panel: Recent Labs  Lab 02/15/21 0755 02/16/21 0959 02/17/21 0431 02/18/21 0436 02/19/21 0440 02/20/21 1043  NA 138 138 141 142 143 144  K 3.9 3.9 3.5 3.2* 3.1* 4.4  CL 110 114* 116* 115* 120* 118*  CO2 21* 18* 22 21* 19* 20*  GLUCOSE 77 95 93 134* 454* 86  BUN 24* 30* 31* 32* 24* 21  CREATININE 1.18*  1.14* 1.39* 1.25* 1.17* 1.13* 1.27*  CALCIUM 8.5* 8.3* 8.5* 8.5* 7.5* 8.1*  PHOS 2.7  --   --   --   --   --  GFR: Estimated Creatinine Clearance: 49.3 mL/min (A) (by C-G formula based on SCr of 1.27 mg/dL (H)).  Liver Function Tests: Recent Labs  Lab 02/15/21 0755  ALBUMIN 1.9*     CBG: Recent Labs  Lab 02/19/21 1601 02/19/21 2147 02/20/21 0628 02/20/21 1153 02/20/21 1641  GLUCAP 114* 131* 89 87 121*      Recent Results (from the past 240 hour(s))  Culture, blood (routine x 2)     Status: None   Collection Time: 02/11/21  2:37 PM   Specimen: Right Antecubital; Blood  Result Value Ref Range Status   Specimen Description   Final    RIGHT ANTECUBITAL BOTTLES DRAWN AEROBIC AND ANAEROBIC   Special Requests Blood Culture adequate volume  Final   Culture   Final    NO GROWTH 5 DAYS Performed at [facility], [address], [place], [place] [address]    Report Status 02/16/2021 FINAL  Final  Resp Panel by RT-PCR (Flu A&B, Covid) Nasopharyngeal Swab     Status: None   Collection Time: 02/11/21  3:31 PM   Specimen: Nasopharyngeal Swab; Nasopharyngeal(NP) swabs in vial transport medium  Result Value Ref Range Status   SARS Coronavirus 2 by RT PCR NEGATIVE NEGATIVE Final    Comment: (NOTE) SARS-CoV-2 target nucleic acids are NOT DETECTED.  The SARS-CoV-2 RNA is generally detectable in upper respiratory specimens during the acute phase of infection. The lowest concentration of SARS-CoV-2 viral copies this assay can detect is 138 copies/mL. A negative result does not preclude  SARS-Cov-2 infection and should not be used as the sole basis for treatment or other patient management decisions. A negative result may occur with  improper specimen collection/handling, submission of specimen other than nasopharyngeal swab, presence of viral mutation(s) within the areas targeted by this assay, and inadequate number of viral copies(<138 copies/mL). A negative result must be combined with clinical observations, patient history, and epidemiological information. The expected result is Negative.  Fact Sheet for Patients:  EntrepreneurPulse.com.au  Fact Sheet for Healthcare Providers:  IncredibleEmployment.be  This test is no t yet approved or cleared by the [place] FDA and  has been authorized for detection and/or diagnosis of SARS-CoV-2 by FDA under an Emergency Use Authorization (EUA). This EUA will remain  in effect (meaning this test can be used) for the duration of the COVID-19 declaration under Section 564(b)(1) of the Act, 21 U.S.C.section 360bbb-3(b)(1), unless the authorization is terminated  or revoked sooner.       Influenza A by PCR NEGATIVE NEGATIVE Final   Influenza B by PCR NEGATIVE NEGATIVE Final    Comment: (NOTE) The Xpert Xpress SARS-CoV-2/FLU/RSV plus assay is intended as an aid in the diagnosis of influenza from Nasopharyngeal swab specimens and should not be used as a sole basis for treatment. Nasal washings and aspirates are unacceptable for Xpert Xpress SARS-CoV-2/FLU/RSV testing.  Fact Sheet for Patients: EntrepreneurPulse.com.au  Fact Sheet for Healthcare Providers: IncredibleEmployment.be  This test is not yet approved or cleared by the [place] FDA and has been authorized for detection and/or diagnosis of SARS-CoV-2 by FDA under an Emergency Use Authorization (EUA). This EUA will remain in effect (meaning this test can be used) for the duration of  the COVID-19 declaration under Section 564(b)(1) of the Act, 21 U.S.C. section 360bbb-3(b)(1), unless the authorization is terminated or revoked.  Performed at [facility], [address], [place], [place] [address]   Culture, blood (routine x 2)     Status: None   Collection  Time: 02/11/21  3:54 PM   Specimen: Left Antecubital; Blood  Result Value Ref Range Status   Specimen Description   Final    LEFT ANTECUBITAL BOTTLES DRAWN AEROBIC AND ANAEROBIC   Special Requests Blood Culture adequate volume  Final   Culture   Final    NO GROWTH 5 DAYS Performed at [facility], [address] [place], [place] [address]    Report Status 02/16/2021 FINAL  Final  Aerobic Culture w Gram Stain (superficial specimen)     Status: None   Collection Time: 02/13/21 12:13 PM   Specimen: Wound  Result Value Ref Range Status   Specimen Description WOUND  Final   Special Requests FOOT LEFT  Final   Gram Stain   Final    NO WBC SEEN MODERATE GRAM VARIABLE ROD RARE GRAM POSITIVE COCCI IN PAIRS Performed at [facility], [place] [address] [place], [place] [address]    Culture   Final    FEW MORAXELLA SPECIES BETA LACTAMASE NEGATIVE FEW PROVIDENCIA STUARTII    Report Status 02/18/2021 FINAL  Final   Organism ID, Bacteria PROVIDENCIA STUARTII  Final      Susceptibility   Providencia stuartii - MIC*    AMPICILLIN >=32 RESISTANT Resistant     CEFAZOLIN >=64 RESISTANT Resistant     CEFEPIME <=0.12 SENSITIVE Sensitive     CEFTAZIDIME <=1 SENSITIVE Sensitive     CEFTRIAXONE <=0.25 SENSITIVE Sensitive     CIPROFLOXACIN >=4 RESISTANT Resistant     GENTAMICIN RESISTANT Resistant     IMIPENEM 0.5 SENSITIVE Sensitive     TRIMETH/SULFA <=20 SENSITIVE Sensitive     AMPICILLIN/SULBACTAM >=32 RESISTANT Resistant     PIP/TAZO 8 SENSITIVE Sensitive     * FEW PROVIDENCIA STUARTII  Surgical pcr screen     Status: None   Collection Time: 02/14/21 10:06 PM   Specimen: Nasal Mucosa; Nasal Swab  Result  Value Ref Range Status   MRSA, PCR NEGATIVE NEGATIVE Final   Staphylococcus aureus NEGATIVE NEGATIVE Final    Comment: (NOTE) The Xpert SA Assay (FDA approved for NASAL specimens in patients 67 years of age and older), is one component of a comprehensive surveillance program. It is not intended to diagnose infection nor to guide or monitor treatment. Performed at [facility], [place] [address], [place], [place] [address]   Urine Culture     Status: Abnormal   Collection Time: 02/15/21  2:04 AM   Specimen: Urine, Catheterized  Result Value Ref Range Status   Specimen Description URINE, CATHETERIZED  Final   Special Requests   Final    NONE Performed at [facility], [place] [address], [place], [place] [address]    Culture 30,000 COLONIES/mL YEAST (A)  Final   Report Status 02/17/2021 FINAL  Final  Culture, blood (routine x 2)     Status: None   Collection Time: 02/15/21 12:49 PM   Specimen: BLOOD  Result Value Ref Range Status   Specimen Description BLOOD RIGHT ANTECUBITAL  Final   Special Requests   Final    BOTTLES DRAWN AEROBIC ONLY Blood Culture adequate volume   Culture   Final    NO GROWTH 5 DAYS Performed at [facility], [place] [address], [place], [place] [address]    Report Status 02/20/2021 FINAL  Final  Culture, blood (routine x 2)     Status: None   Collection Time: 02/15/21 12:58 PM   Specimen: BLOOD LEFT HAND  Result  Value Ref Range Status   Specimen Description BLOOD LEFT HAND  Final   Special Requests   Final    BOTTLES DRAWN AEROBIC ONLY Blood Culture results may not be optimal due to an inadequate volume of blood received in culture bottles   Culture   Final    NO GROWTH 5 DAYS Performed at [facility], [place] [address] [place], [place] [address]    Report Status 02/20/2021 FINAL  Final          Radiology Studies: VAS [place] UPPER EXTREMITY VENOUS DUPLEX  Result Date: 02/20/2021 UPPER VENOUS STUDY  Patient Name:  [name]   Date of Exam:   02/20/2021 Medical Rec #: [id number]          Accession #:    [id number] Date of Birth: [birth year]          Patient Gender: F Patient Age:   1Y Exam Location:  [facility] Procedure:      VAS [place] UPPER EXTREMITY VENOUS DUPLEX Referring Phys: [id number] --------------------------------------------------------------------------------  Indications: Edema Limitations: Body habitus, patient somnolence and inability to cooperate, bandages and line. Comparison Study: No prior study Performing Technologist: Sharion Dove RVS  Examination Guidelines: A complete evaluation includes B-mode imaging, spectral Doppler, color Doppler, and power Doppler as needed of all accessible portions of each vessel. Bilateral testing is considered an integral part of a complete examination. Limited examinations for reoccurring indications may be performed as noted.  Right Findings: +----------+------------+---------+-----------+----------+-------+ RIGHT     CompressiblePhasicitySpontaneousPropertiesSummary +----------+------------+---------+-----------+----------+-------+ IJV           Full       Yes       Yes                      +----------+------------+---------+-----------+----------+-------+ Subclavian               Yes       Yes                      +----------+------------+---------+-----------+----------+-------+ Axillary                 Yes       Yes                      +----------+------------+---------+-----------+----------+-------+ Brachial      Full       Yes       Yes                      +----------+------------+---------+-----------+----------+-------+ Radial                   Yes       Yes                      +----------+------------+---------+-----------+----------+-------+ Ulnar                    Yes       Yes                      +----------+------------+---------+-----------+----------+-------+ Cephalic      Full                                           +----------+------------+---------+-----------+----------+-------+ Basilic  Full                                          +----------+------------+---------+-----------+----------+-------+  Left Findings: +----------+------------+---------+-----------+----------+--------------+ LEFT      CompressiblePhasicitySpontaneousProperties   Summary     +----------+------------+---------+-----------+----------+--------------+ IJV                      Yes       Yes                             +----------+------------+---------+-----------+----------+--------------+ Subclavian               Yes       Yes                             +----------+------------+---------+-----------+----------+--------------+ Axillary                 Yes       Yes                             +----------+------------+---------+-----------+----------+--------------+ Brachial      Full       Yes       Yes              Not visualized +----------+------------+---------+-----------+----------+--------------+ Radial        Full                                                 +----------+------------+---------+-----------+----------+--------------+ Cephalic      Full                                                 +----------+------------+---------+-----------+----------+--------------+ Basilic       Full                                                 +----------+------------+---------+-----------+----------+--------------+  Summary:  Right: No evidence of deep vein thrombosis in the upper extremity. No evidence of superficial vein thrombosis in the upper extremity.  Left: No evidence of deep vein thrombosis in the visualized veins of the upper extremity. No evidence of superficial vein thrombosis in the upper extremity.  *See table(s) above for measurements and observations.    Preliminary         Scheduled Meds:  aspirin EC  81 mg Oral Daily   atorvastatin   40 mg Oral Daily   busPIRone  5 mg Oral BID   Chlorhexidine Gluconate Cloth  6 each Topical Daily   clopidogrel  75 mg Oral Daily   collagenase   Topical BID   feeding supplement  237 mL Oral BID BM   multivitamin with minerals  1 tablet Oral Daily   pantoprazole  40 mg Oral Daily   potassium chloride  40 mEq Oral BID   pyridostigmine  60 mg  Oral TID   sodium chloride flush  10-40 mL Intracatheter Q12H   sodium chloride flush  3 mL Intravenous Q12H   Continuous Infusions:  sodium chloride     cefTRIAXone (ROCEPHIN)  IV 2 g (02/19/21 2138)     LOS: 9 days        Hosie Poisson, MD [facility]   To contact the attending provider between 7A-7P or the covering provider during after hours 7P-7A, please log into the web site [email or web address] and access using universal [place] password for that web site. If you do not have the password, please call the hospital operator.  02/20/2021, 6:28 PM

## 2021-02-20 NOTE — Progress Notes (Signed)
VASCULAR LAB    Bilateral upper extremity duplex has been performed.  See CV proc for preliminary results.   Consuelo Suthers, RVT 02/20/2021, 11:25 AM

## 2021-02-21 DIAGNOSIS — D72829 Elevated white blood cell count, unspecified: Secondary | ICD-10-CM | POA: Diagnosis not present

## 2021-02-21 DIAGNOSIS — G4733 Obstructive sleep apnea (adult) (pediatric): Secondary | ICD-10-CM | POA: Diagnosis not present

## 2021-02-21 DIAGNOSIS — E1159 Type 2 diabetes mellitus with other circulatory complications: Secondary | ICD-10-CM | POA: Diagnosis not present

## 2021-02-21 DIAGNOSIS — G7000 Myasthenia gravis without (acute) exacerbation: Secondary | ICD-10-CM | POA: Diagnosis not present

## 2021-02-21 LAB — RETICULOCYTES
Immature Retic Fract: 39.5 % — ABNORMAL HIGH (ref 2.3–15.9)
RBC.: 2.59 MIL/uL — ABNORMAL LOW (ref 3.87–5.11)
Retic Count, Absolute: 67.1 10*3/uL (ref 19.0–186.0)
Retic Ct Pct: 2.6 % (ref 0.4–3.1)

## 2021-02-21 LAB — BPAM RBC
Blood Product Expiration Date: 202208302359
ISSUE DATE / TIME: 202207302120
Unit Type and Rh: 5100

## 2021-02-21 LAB — CBC WITH DIFFERENTIAL/PLATELET
Abs Immature Granulocytes: 0.21 10*3/uL — ABNORMAL HIGH (ref 0.00–0.07)
Basophils Absolute: 0.1 10*3/uL (ref 0.0–0.1)
Basophils Relative: 1 %
Eosinophils Absolute: 0.8 10*3/uL — ABNORMAL HIGH (ref 0.0–0.5)
Eosinophils Relative: 7 %
HCT: 24.3 % — ABNORMAL LOW (ref 36.0–46.0)
Hemoglobin: 8 g/dL — ABNORMAL LOW (ref 12.0–15.0)
Immature Granulocytes: 2 %
Lymphocytes Relative: 15 %
Lymphs Abs: 1.7 10*3/uL (ref 0.7–4.0)
MCH: 31.5 pg (ref 26.0–34.0)
MCHC: 32.9 g/dL (ref 30.0–36.0)
MCV: 95.7 fL (ref 80.0–100.0)
Monocytes Absolute: 1 10*3/uL (ref 0.1–1.0)
Monocytes Relative: 8 %
Neutro Abs: 7.7 10*3/uL (ref 1.7–7.7)
Neutrophils Relative %: 67 %
Platelets: 249 10*3/uL (ref 150–400)
RBC: 2.54 MIL/uL — ABNORMAL LOW (ref 3.87–5.11)
RDW: 17.6 % — ABNORMAL HIGH (ref 11.5–15.5)
WBC: 11.4 10*3/uL — ABNORMAL HIGH (ref 4.0–10.5)
nRBC: 0.8 % — ABNORMAL HIGH (ref 0.0–0.2)

## 2021-02-21 LAB — BASIC METABOLIC PANEL
Anion gap: 3 — ABNORMAL LOW (ref 5–15)
BUN: 19 mg/dL (ref 8–23)
CO2: 22 mmol/L (ref 22–32)
Calcium: 8.3 mg/dL — ABNORMAL LOW (ref 8.9–10.3)
Chloride: 118 mmol/L — ABNORMAL HIGH (ref 98–111)
Creatinine, Ser: 1.23 mg/dL — ABNORMAL HIGH (ref 0.44–1.00)
GFR, Estimated: 47 mL/min — ABNORMAL LOW (ref >60)
Glucose, Bld: 99 mg/dL (ref 70–99)
Potassium: 5.3 mmol/L — ABNORMAL HIGH (ref 3.5–5.1)
Sodium: 143 mmol/L (ref 135–145)

## 2021-02-21 LAB — TYPE AND SCREEN
ABO/RH(D): O POS
Antibody Screen: NEGATIVE
Unit division: 0

## 2021-02-21 LAB — GLUCOSE, CAPILLARY
Glucose-Capillary: 63 mg/dL — ABNORMAL LOW (ref 70–99)
Glucose-Capillary: 64 mg/dL — ABNORMAL LOW (ref 70–99)
Glucose-Capillary: 77 mg/dL (ref 70–99)
Glucose-Capillary: 93 mg/dL (ref 70–99)
Glucose-Capillary: 110 mg/dL — ABNORMAL HIGH (ref 70–99)
Glucose-Capillary: 167 mg/dL — ABNORMAL HIGH (ref 70–99)

## 2021-02-21 LAB — VITAMIN B12: Vitamin B-12: 505 pg/mL (ref 180–914)

## 2021-02-21 LAB — IRON AND TIBC
Iron: 28 ug/dL (ref 28–170)
Saturation Ratios: 28 % (ref 10.4–31.8)
TIBC: 99 ug/dL — ABNORMAL LOW (ref 250–450)
UIBC: 71 ug/dL

## 2021-02-21 LAB — FOLATE: Folate: 4 ng/mL — ABNORMAL LOW (ref >5.9)

## 2021-02-21 LAB — FERRITIN: Ferritin: 425 ng/mL — ABNORMAL HIGH (ref 11–307)

## 2021-02-21 MED ORDER — FUROSEMIDE 10 MG/ML IJ SOLN
20.0000 mg | Freq: Once | INTRAMUSCULAR | Status: AC
  Administered 2021-02-21: 20 mg via INTRAVENOUS
  Filled 2021-02-21: qty 2

## 2021-02-21 MED ORDER — GLUCOSE 40 % PO GEL: 1.0000 | ORAL | Status: AC

## 2021-02-21 NOTE — Progress Notes (Signed)
RT placed patient on CPAP HS. 2L O2 bleed in needed. Patient tolerating well.  ?

## 2021-02-21 NOTE — Progress Notes (Signed)
PROGRESS NOTE    [name]  QQI:[id number] DOB: [birth year] DOA: 02/11/2021 PCP: Leslie Andrea, MD    Chief Complaint  Patient presents with   Leg Pain    Brief Narrative:  70 y/o female sent to ED from wound care center for worsening of L foot wound. There was concern for underlying osteomyelitis since per outpatient provider, wound could be probed to the bone. MRI foot did not show any osteomyelitis or abscess. ABI were 0.75, but likely falsely elevated. Since here wound was not healing despite wound care and antibiotics, she as seen by vascular and underwent aortogram with bilateral lower extremity runoff, s/p balloon angioplasty left tibioperoneal trunk and placement of a stent of left SFA. Patient bcame somnolent post procedure and  confused.  Initial CT of the head is negative for acute stroke.  MRI of the brain without contrast  negative for acute stroke.  Patient seen and examined, no new complaints at this time.  Assessment & Plan:   Active Problems:   HLD (hyperlipidemia)   OBSTRUCTIVE SLEEP APNEA   Ocular myasthenia gravis (HCC)   Hypomagnesemia   Social problem   Open wound of plantar aspect of foot   DM2 (diabetes mellitus, type 2) (HCC)   Nonhealing left heel wound and cellulitis On admission patient was found to have purulent discharge and foul-smelling from the left heel wound. She recently completed treatment with ciprofloxacin for Serratia isolated from the wound cultures. Repeat wound cultures show Providential sensitive to rocephin.  DC antibiotics after tomorrow's dose. MRI of the foot does not show any osteomyelitis or abscess Vascular surgery consulted and she underwent aortogram, balloon angioplasty and left SFA stent. Started her on aspirin, plavix and continue with statin. Currently waiting for SNF placement.     Acute encephalopathy of unclear etiology/ metabolic encephalopathy.  Differentials include lethargy from moderate sedation vs   encephalopathy from elevated ammonia levels. EEG does not show any acute abnormality, TSH wnl, vitamin b12 levels are wnl.  UA is slightly abnormal , but pt is already on IV cefepime transitioned to IV rocephin. .  Ammonia levels elevated, added lactulose.  Initial CT head negative, follow up with MRI of the brain does not show any acute stroke.  Neurology consulted, no further recommendations at this time.  Patient appears alert and oriented, no new complaints at this time.  Type 2 diabetes mellitus A1c is 4.9 Continue with sliding scale insulin. CBG (last 3)  Recent Labs    02/21/21 0658 02/21/21 0747 02/21/21 1119  GLUCAP 63* 77 93   No changes in meds.     Lymphedema Worsening, with bilateral upper extremity edema.  She appears to be fluid overloaded.  Continue with IV Lasix,  at home pt is on 40 mg  of lasix daily . Cxr does not show any pneumonia or pleural effusion or CHF.     Mild AKI With metabolic acidosis:  Renal parameters have improved.  Recheck labs in the morning.   Hypokalemia:  Replaced.    Obstructive sleep apnea Continue with CPAP   Ocular myasthenia gravis Patient on pyridostigmine  Hyperlipidemia Resume statin.    Deconditioning and generalized weakness:  Therapy evaluations recommending SNF.    Upper extremity edema Venous duplex of the upper extremities is negative for DVT   Loose stools:  D/ced lactulose.    Anemia of blood loss superimposed on anemia of chronic disease Baseline hemoglobin appears to be around 9,  received a unit of PRBC transfusion on  02/20/2021. Repeat hemoglobin is pending. Anemia panel will be ordered for further evaluation.  Stool for occult blood will be ordered.     DVT prophylaxis: Heparin Code Status: Full code Family Communication: (None at bedside.) Disposition:   Status is: Inpatient  Remains inpatient appropriate because:Ongoing diagnostic testing needed not appropriate for outpatient  work up, Unsafe d/c plan, and IV treatments appropriate due to intensity of illness or inability to take PO  Dispo: The patient is from: Home              Anticipated d/c is to: SNF              Patient currently is not medically stable to d/c.   Difficult to place patient No       Consultants:  Vascular surgery  Procedures: MRI of the brain Aortogram Antimicrobials:  Antibiotics Given (last 72 hours)     Date/Time Action Medication Dose Rate   02/18/21 2005 New Bag/Given   cefTRIAXone (ROCEPHIN) 2 g in sodium chloride 0.9 % 100 mL IVPB 2 g 200 mL/hr   02/19/21 2138 New Bag/Given   cefTRIAXone (ROCEPHIN) 2 g in sodium chloride 0.9 % 100 mL IVPB 2 g 200 mL/hr   02/20/21 2043 New Bag/Given   cefTRIAXone (ROCEPHIN) 2 g in sodium chloride 0.9 % 100 mL IVPB 2 g 200 mL/hr         Subjective: No coughing today. No chest pain or sob.    Objective: Vitals:   02/21/21 0020 02/21/21 0345 02/21/21 0749 02/21/21 1120  BP: 135/62 (!) 125/58 (!) 124/51 (!) 117/56  Pulse: 73 73 79 76  Resp: 18 18 19 18   Temp: 98.5 F (36.9 C) 98.2 F (36.8 C) 97.7 F (36.5 C) 97.9 F (36.6 C)  TempSrc: Oral Oral Oral Oral  SpO2: 100% 100% 100% 97%  Weight:      Height:        Intake/Output Summary (Last 24 hours) at 02/21/2021 1327 Last data filed at 02/21/2021 0020 Gross per 24 hour  Intake 450.33 ml  Output 1000 ml  Net -549.67 ml    Filed Weights   02/11/21 1909 02/16/21 0353 02/17/21 0300  Weight: 108.4 kg 105.9 kg 106.4 kg    Examination:  General exam: Elderly woman not in any kind of distress Respiratory system: Air entry fair bilateral no wheezing or rhonchi Cardiovascular system: S1-S2 heard, regular rate rhythm, pedal edema and bilateral upper extremity edema present Gastrointestinal system: Abdomen is soft nontender bowel sounds normal Central nervous system: Alert, grossly nonfocal Extremities: Bilateral upper extremity edema present Skin: left heel wound.   Psychiatry: Mood appropriate    Data Reviewed: I have personally reviewed following labs and imaging studies  CBC: Recent Labs  Lab 02/15/21 0755 02/15/21 1730 02/16/21 0959 02/18/21 0436 02/20/21 1043  WBC 28.4* 27.0* 19.0* 15.6* 13.1*  NEUTROABS  --  23.7*  --   --  8.8*  HGB 8.7* 8.9* 7.9* 7.2* 7.2*  HCT 25.7* 25.3* 24.0* 21.8* 21.9*  MCV 92.1 91.0 94.5 94.4 97.3  PLT 312 323 261 234 251     Basic Metabolic Panel: Recent Labs  Lab 02/15/21 0755 02/16/21 0959 02/17/21 0431 02/18/21 0436 02/19/21 0440 02/20/21 1043  NA 138 138 141 142 143 144  K 3.9 3.9 3.5 3.2* 3.1* 4.4  CL 110 114* 116* 115* 120* 118*  CO2 21* 18* 22 21* 19* 20*  GLUCOSE 77 95 93 134* 454* 86  BUN 24* 30* 31* 32* 24*  21  CREATININE 1.18*  1.14* 1.39* 1.25* 1.17* 1.13* 1.27*  CALCIUM 8.5* 8.3* 8.5* 8.5* 7.5* 8.1*  PHOS 2.7  --   --   --   --   --      GFR: Estimated Creatinine Clearance: 49.3 mL/min (A) (by C-G formula based on SCr of 1.27 mg/dL (H)).  Liver Function Tests: Recent Labs  Lab 02/15/21 0755  ALBUMIN 1.9*     CBG: Recent Labs  Lab 02/20/21 2102 02/21/21 0630 02/21/21 0658 02/21/21 0747 02/21/21 1119  GLUCAP 106* 64* 63* 77 93      Recent Results (from the past 240 hour(s))  Culture, blood (routine x 2)     Status: None   Collection Time: 02/11/21  2:37 PM   Specimen: Right Antecubital; Blood  Result Value Ref Range Status   Specimen Description   Final    RIGHT ANTECUBITAL BOTTLES DRAWN AEROBIC AND ANAEROBIC   Special Requests Blood Culture adequate volume  Final   Culture   Final    NO GROWTH 5 DAYS Performed at [facility], [address] [place], [place] [address]    Report Status 02/16/2021 FINAL  Final  Resp Panel by RT-PCR (Flu A&B, Covid) Nasopharyngeal Swab     Status: None   Collection Time: 02/11/21  3:31 PM   Specimen: Nasopharyngeal Swab; Nasopharyngeal(NP) swabs in vial transport medium  Result Value Ref Range Status   SARS  Coronavirus 2 by RT PCR NEGATIVE NEGATIVE Final    Comment: (NOTE) SARS-CoV-2 target nucleic acids are NOT DETECTED.  The SARS-CoV-2 RNA is generally detectable in upper respiratory specimens during the acute phase of infection. The lowest concentration of SARS-CoV-2 viral copies this assay can detect is 138 copies/mL. A negative result does not preclude SARS-Cov-2 infection and should not be used as the sole basis for treatment or other patient management decisions. A negative result may occur with  improper specimen collection/handling, submission of specimen other than nasopharyngeal swab, presence of viral mutation(s) within the areas targeted by this assay, and inadequate number of viral copies(<138 copies/mL). A negative result must be combined with clinical observations, patient history, and epidemiological information. The expected result is Negative.  Fact Sheet for Patients:  EntrepreneurPulse.com.au  Fact Sheet for Healthcare Providers:  IncredibleEmployment.be  This test is no t yet approved or cleared by the [place] FDA and  has been authorized for detection and/or diagnosis of SARS-CoV-2 by FDA under an Emergency Use Authorization (EUA). This EUA will remain  in effect (meaning this test can be used) for the duration of the COVID-19 declaration under Section 564(b)(1) of the Act, 21 U.S.C.section 360bbb-3(b)(1), unless the authorization is terminated  or revoked sooner.       Influenza A by PCR NEGATIVE NEGATIVE Final   Influenza B by PCR NEGATIVE NEGATIVE Final    Comment: (NOTE) The Xpert Xpress SARS-CoV-2/FLU/RSV plus assay is intended as an aid in the diagnosis of influenza from Nasopharyngeal swab specimens and should not be used as a sole basis for treatment. Nasal washings and aspirates are unacceptable for Xpert Xpress SARS-CoV-2/FLU/RSV testing.  Fact Sheet for  Patients: EntrepreneurPulse.com.au  Fact Sheet for Healthcare Providers: IncredibleEmployment.be  This test is not yet approved or cleared by the [place] FDA and has been authorized for detection and/or diagnosis of SARS-CoV-2 by FDA under an Emergency Use Authorization (EUA). This EUA will remain in effect (meaning this test can be used) for the duration of the COVID-19 declaration under Section 564(b)(1) of  the Act, 21 U.S.C. section 360bbb-3(b)(1), unless the authorization is terminated or revoked.  Performed at [facility], [address], [place], [place] [address]   Culture, blood (routine x 2)     Status: None   Collection Time: 02/11/21  3:54 PM   Specimen: Left Antecubital; Blood  Result Value Ref Range Status   Specimen Description   Final    LEFT ANTECUBITAL BOTTLES DRAWN AEROBIC AND ANAEROBIC   Special Requests Blood Culture adequate volume  Final   Culture   Final    NO GROWTH 5 DAYS Performed at [facility], [address], [place], [place] [address]    Report Status 02/16/2021 FINAL  Final  Aerobic Culture w Gram Stain (superficial specimen)     Status: None   Collection Time: 02/13/21 12:13 PM   Specimen: Wound  Result Value Ref Range Status   Specimen Description WOUND  Final   Special Requests FOOT LEFT  Final   Gram Stain   Final    NO WBC SEEN MODERATE GRAM VARIABLE ROD RARE GRAM POSITIVE COCCI IN PAIRS Performed at [facility], [place] [address], [place], [place] [address]    Culture   Final    FEW MORAXELLA SPECIES BETA LACTAMASE NEGATIVE FEW PROVIDENCIA STUARTII    Report Status 02/18/2021 FINAL  Final   Organism ID, Bacteria PROVIDENCIA STUARTII  Final      Susceptibility   Providencia stuartii - MIC*    AMPICILLIN >=32 RESISTANT Resistant     CEFAZOLIN >=64 RESISTANT Resistant     CEFEPIME <=0.12 SENSITIVE Sensitive     CEFTAZIDIME <=1 SENSITIVE Sensitive     CEFTRIAXONE <=0.25 SENSITIVE  Sensitive     CIPROFLOXACIN >=4 RESISTANT Resistant     GENTAMICIN RESISTANT Resistant     IMIPENEM 0.5 SENSITIVE Sensitive     TRIMETH/SULFA <=20 SENSITIVE Sensitive     AMPICILLIN/SULBACTAM >=32 RESISTANT Resistant     PIP/TAZO 8 SENSITIVE Sensitive     * FEW PROVIDENCIA STUARTII  Surgical pcr screen     Status: None   Collection Time: 02/14/21 10:06 PM   Specimen: Nasal Mucosa; Nasal Swab  Result Value Ref Range Status   MRSA, PCR NEGATIVE NEGATIVE Final   Staphylococcus aureus NEGATIVE NEGATIVE Final    Comment: (NOTE) The Xpert SA Assay (FDA approved for NASAL specimens in patients 55 years of age and older), is one component of a comprehensive surveillance program. It is not intended to diagnose infection nor to guide or monitor treatment. Performed at [facility], [place] [address], [place], [place] [address]   Urine Culture     Status: Abnormal   Collection Time: 02/15/21  2:04 AM   Specimen: Urine, Catheterized  Result Value Ref Range Status   Specimen Description URINE, CATHETERIZED  Final   Special Requests   Final    NONE Performed at [facility], [place] [address] [place], [place] [address]    Culture 30,000 COLONIES/mL YEAST (A)  Final   Report Status 02/17/2021 FINAL  Final  Culture, blood (routine x 2)     Status: None   Collection Time: 02/15/21 12:49 PM   Specimen: BLOOD  Result Value Ref Range Status   Specimen Description BLOOD RIGHT ANTECUBITAL  Final   Special Requests   Final    BOTTLES DRAWN AEROBIC ONLY Blood Culture adequate volume   Culture   Final    NO GROWTH 5 DAYS Performed at [facility], [place]  [address], [place], [place] [address]    Report Status 02/20/2021 FINAL  Final  Culture, blood (routine x 2)     Status: None   Collection Time: 02/15/21 12:58 PM   Specimen: BLOOD LEFT HAND  Result Value Ref Range Status   Specimen Description BLOOD LEFT HAND  Final   Special Requests   Final    BOTTLES DRAWN AEROBIC ONLY Blood  Culture results may not be optimal due to an inadequate volume of blood received in culture bottles   Culture   Final    NO GROWTH 5 DAYS Performed at [facility], [place] [address], [place], [place] [address]    Report Status 02/20/2021 FINAL  Final          Radiology Studies: DG CHEST PORT 1 VIEW  Result Date: 02/20/2021 CLINICAL DATA:  Coughing up phlegm. EXAM: PORTABLE CHEST 1 VIEW COMPARISON:  February 18, 2021 FINDINGS: A left PICC line terminates in the SVC. Stable cardiomegaly. The hila and mediastinum are unchanged. No pneumothorax. No nodules or masses. Mild atelectasis in the bases. No suspicious infiltrates. IMPRESSION: No active disease. Electronically Signed   By: Dorise Bullion III M.D   On: 02/20/2021 18:29   VAS [place] UPPER EXTREMITY VENOUS DUPLEX  Result Date: 02/20/2021 UPPER VENOUS STUDY  Patient Name:  [name]  Date of Exam:   02/20/2021 Medical Rec #: [id number]          Accession #:    [id number] Date of Birth: 1950/10/25          Patient Gender: F Patient Age:   17Y Exam Location:  [facility] Procedure:      VAS [place] UPPER EXTREMITY VENOUS DUPLEX Referring Phys: [id number] Alynna Hargrove --------------------------------------------------------------------------------  Indications: Edema Limitations: Body habitus, patient somnolence and inability to cooperate, bandages and line. Comparison Study: No prior study Performing Technologist: Sharion Dove RVS  Examination Guidelines: A complete evaluation includes B-mode imaging, spectral Doppler, color Doppler, and power Doppler as needed of all accessible portions of each vessel. Bilateral testing is considered an integral part of a complete examination. Limited examinations for reoccurring indications may be performed as noted.  Right Findings: +----------+------------+---------+-----------+----------+-------+ RIGHT     CompressiblePhasicitySpontaneousPropertiesSummary  +----------+------------+---------+-----------+----------+-------+ IJV           Full       Yes       Yes                      +----------+------------+---------+-----------+----------+-------+ Subclavian               Yes       Yes                      +----------+------------+---------+-----------+----------+-------+ Axillary                 Yes       Yes                      +----------+------------+---------+-----------+----------+-------+ Brachial      Full       Yes       Yes                      +----------+------------+---------+-----------+----------+-------+ Radial                   Yes       Yes                      +----------+------------+---------+-----------+----------+-------+  Ulnar                    Yes       Yes                      +----------+------------+---------+-----------+----------+-------+ Cephalic      Full                                          +----------+------------+---------+-----------+----------+-------+ Basilic       Full                                          +----------+------------+---------+-----------+----------+-------+  Left Findings: +----------+------------+---------+-----------+----------+--------------+ LEFT      CompressiblePhasicitySpontaneousProperties   Summary     +----------+------------+---------+-----------+----------+--------------+ IJV                      Yes       Yes                             +----------+------------+---------+-----------+----------+--------------+ Subclavian               Yes       Yes                             +----------+------------+---------+-----------+----------+--------------+ Axillary                 Yes       Yes                             +----------+------------+---------+-----------+----------+--------------+ Brachial      Full       Yes       Yes              Not visualized  +----------+------------+---------+-----------+----------+--------------+ Radial        Full                                                 +----------+------------+---------+-----------+----------+--------------+ Cephalic      Full                                                 +----------+------------+---------+-----------+----------+--------------+ Basilic       Full                                                 +----------+------------+---------+-----------+----------+--------------+  Summary:  Right: No evidence of deep vein thrombosis in the upper extremity. No evidence of superficial vein thrombosis in the upper extremity.  Left: No evidence of deep vein thrombosis in the visualized veins of the upper extremity. No evidence of superficial vein thrombosis in the upper extremity.  *See table(s) above for measurements and observations.  Diagnosing physician: Deitra Mayo MD Electronically signed by Deitra Mayo MD on 02/20/2021 at 6:45:16 PM.    Final         Scheduled Meds:  aspirin EC  81 mg Oral Daily   atorvastatin  40 mg Oral Daily   busPIRone  5 mg Oral BID   Chlorhexidine Gluconate Cloth  6 each Topical Daily   clopidogrel  75 mg Oral Daily   collagenase   Topical BID   dextrose  1 Tube Oral STAT   feeding supplement  237 mL Oral BID BM   furosemide  20 mg Intravenous Once   multivitamin with minerals  1 tablet Oral Daily   pantoprazole  40 mg Oral Daily   potassium chloride  40 mEq Oral BID   pyridostigmine  60 mg Oral TID   sodium chloride flush  10-40 mL Intracatheter Q12H   sodium chloride flush  3 mL Intravenous Q12H   Continuous Infusions:  sodium chloride     cefTRIAXone (ROCEPHIN)  IV 2 g (02/20/21 2043)     LOS: 10 days        Hosie Poisson, MD [facility]   To contact the attending provider between 7A-7P or the covering provider during after hours 7P-7A, please log into the web site [email or web address] and access using  universal  password for that web site. If you do not have the password, please call the hospital operator.  02/21/2021, 1:27 PM

## 2021-02-21 NOTE — TOC Progression Note (Addendum)
Transition of Care [facility]) - Progression Note    Patient Details  Name: [name] MRN: [id number] Date of Birth: 1950-12-21  Transition of Care [facility]) CM/SW [place], [place] Phone Number: [phone] 02/21/2021, 1:23 PM  Clinical Narrative:     CSW spoke with T[place]ka in regards to bed offers. She chose [facility]. CSW attempted to follow up with Jackelyn Poling at [facility] and had to leave a voice mail.  CSW will follow up with Charge RN in regards to potential DC date in order to see if insurance authorization will need to be started.  2:15pm: CSW started pt authorization in the [place] will need to be confirmed once bed at [facility] is confirmed. Auth ID# [id number].   TOC team will continue to assist with discharge planning needs.    Expected Discharge Plan: Skilled Nursing Facility Barriers to Discharge: Ship broker, Continued Medical Work up, SNF Pending bed offer  Expected Discharge Plan and Services Expected Discharge Plan: [place] In-house Referral: Clinical Social Work Discharge Planning Services: CM Consult   Living arrangements for the past 2 months: Single Family Home                                       Social Determinants of Health (SDOH) Interventions    Readmission Risk Interventions Readmission Risk Prevention Plan 02/12/2021 10/20/2020 10/04/2020  Transportation Screening Complete Complete Complete  PCP or Specialist Appt within 5-7 Days - - Complete  Home Care Screening - - Complete  Medication Review (RN CM) - - Complete  HRI or [place] - Complete -  Social Work Consult for [place] Planning/Counseling - Complete -  Palliative Care Screening - Not Applicable -  Medication Review Press photographer) Complete Complete -  [place] or Home Care Consult Complete - -  SW Recovery Care/Counseling Consult Complete - -  Palliative Care Screening Not Applicable - -  [place] Not Applicable - -  Some recent data might be hidden

## 2021-02-22 ENCOUNTER — Ambulatory Visit: Payer: Medicare Other | Admitting: Obstetrics & Gynecology

## 2021-02-22 DIAGNOSIS — E1159 Type 2 diabetes mellitus with other circulatory complications: Secondary | ICD-10-CM | POA: Diagnosis not present

## 2021-02-22 DIAGNOSIS — D72829 Elevated white blood cell count, unspecified: Secondary | ICD-10-CM | POA: Diagnosis not present

## 2021-02-22 DIAGNOSIS — G4733 Obstructive sleep apnea (adult) (pediatric): Secondary | ICD-10-CM | POA: Diagnosis not present

## 2021-02-22 DIAGNOSIS — G7000 Myasthenia gravis without (acute) exacerbation: Secondary | ICD-10-CM | POA: Diagnosis not present

## 2021-02-22 DIAGNOSIS — Z0289 Encounter for other administrative examinations: Secondary | ICD-10-CM

## 2021-02-22 LAB — BASIC METABOLIC PANEL
Anion gap: 4 — ABNORMAL LOW (ref 5–15)
BUN: 18 mg/dL (ref 8–23)
CO2: 22 mmol/L (ref 22–32)
Calcium: 8.2 mg/dL — ABNORMAL LOW (ref 8.9–10.3)
Chloride: 115 mmol/L — ABNORMAL HIGH (ref 98–111)
Creatinine, Ser: 1.16 mg/dL — ABNORMAL HIGH (ref 0.44–1.00)
GFR, Estimated: 50 mL/min — ABNORMAL LOW (ref >60)
Glucose, Bld: 80 mg/dL (ref 70–99)
Potassium: 5.1 mmol/L (ref 3.5–5.1)
Sodium: 141 mmol/L (ref 135–145)

## 2021-02-22 LAB — GLUCOSE, CAPILLARY
Glucose-Capillary: 72 mg/dL (ref 70–99)
Glucose-Capillary: 91 mg/dL (ref 70–99)
Glucose-Capillary: 105 mg/dL — ABNORMAL HIGH (ref 70–99)
Glucose-Capillary: 115 mg/dL — ABNORMAL HIGH (ref 70–99)

## 2021-02-22 LAB — SARS CORONAVIRUS 2 (TAT 6-24 HRS): SARS Coronavirus 2: NEGATIVE

## 2021-02-22 MED ORDER — FUROSEMIDE 10 MG/ML IJ SOLN
20.0000 mg | Freq: Two times a day (BID) | INTRAMUSCULAR | Status: DC
  Administered 2021-02-22 – 2021-02-24 (×5): 20 mg via INTRAVENOUS
  Filled 2021-02-22 (×6): qty 2

## 2021-02-22 MED ORDER — FOLIC ACID 1 MG PO TABS
1.0000 mg | ORAL_TABLET | Freq: Every day | ORAL | Status: DC
  Administered 2021-02-22 – 2021-02-24 (×3): 1 mg via ORAL
  Filled 2021-02-22 (×3): qty 1

## 2021-02-22 NOTE — Progress Notes (Signed)
Mobility Specialist: Progress Note   02/22/21 1823  Mobility  Activity Transferred:  Chair to bed  Level of Assistance Moderate assist, patient does 50-74%  Assistive Device Stedy  Mobility Out of bed to chair with meals  Mobility Response Tolerated well  Mobility performed by Mobility specialist  $Mobility charge 1 Mobility   Post-Mobility: 77 HR, 100% SpO2  Pt required heavy modA to stand from chair using the [place]. Pt c/o feeling dizzy after returning to the bed, RN present. Pt back to bed with call bell at her side and family member present in the room.   [facility] Aleen Marston Mobility Specialist Mobility Specialist Phone: [phone]

## 2021-02-22 NOTE — Progress Notes (Signed)
  Speech Language Pathology Treatment: Dysphagia  Patient Details Name: [name] MRN: [id number] DOB: September 07, 1950 Today's Date: 02/22/2021 Time: 5329-9242 SLP Time Calculation (min) (ACUTE ONLY): 11 min  Assessment / Plan / Recommendation Clinical Impression  Pt was seen for dysphagia treatment. Pt and her nurse reported that the pt does not like the dysphagia 2 diet. Pt's NT stated that the pt tolerated breakfast without difficulty and coughed once or twice during lunch. Pt tolerated puree solids, dysphagia 3 solids, and consecutive swallows of thin liquids without overt s/sx of aspiration. A single cough was noted during mastication of the initial bolus of regular texture solids, but she tolerated all subsequent regular-texture boluses without overt s/sx of aspiration. Mastication was mildly prolonged with regular textures, but [facility] for dysphagia 3 solids, and oral clearance was WNL. The session was abbreviated per the pt's request since she stated that she would not like anything else to eat. Pt's diet will be advanced to dysphagia 3 solids and thin liquids. SLP will continue to follow pt.    HPI HPI: 70 yo admitted 7/21 to [facility] with left heel ulcer and ischemia. Transfer to [facility] 7/22. 7/25 angiography with SFA stent and Left tibioperoneal angioplasty. Pt with AMS post procedure with head CT and MRI negative.  PMhx: GERD, anxiety/depression, HTN, HLD, OSA, gout, asthma      SLP Plan  Continue with current plan of care       Recommendations  Diet recommendations: Dysphagia 3 (mechanical soft);Thin liquid Liquids provided via: Cup;Straw Medication Administration: Crushed with puree Supervision: Staff to assist with self feeding Compensations: Minimize environmental distractions;Slow rate;Small sips/bites Postural Changes and/or Swallow Maneuvers: Seated upright 90 degrees                Oral Care Recommendations: Oral care BID Follow up Recommendations: Skilled Nursing  facility SLP Visit Diagnosis: Dysphagia, oral phase (R13.11) Plan: Continue with current plan of care       Joscelyn Hardrick I. Hardin Negus, [place], [place] Office number [phone] Pager [phone]                Horton Marshall 02/22/2021, 5:18 PM

## 2021-02-22 NOTE — Progress Notes (Signed)
PROGRESS NOTE    [name]  VEH:[id number] DOB: [birth year] DOA: 02/11/2021 PCP: Leslie Andrea, MD    Chief Complaint  Patient presents with   Leg Pain    Brief Narrative:  70 y/o female sent to ED from wound care center for worsening of L foot wound. There was concern for underlying osteomyelitis since per outpatient provider, wound could be probed to the bone. MRI foot did not show any osteomyelitis or abscess. ABI were 0.75, but likely falsely elevated. Since here wound was not healing despite wound care and antibiotics, she as seen by vascular and underwent aortogram with bilateral lower extremity runoff, s/p balloon angioplasty left tibioperoneal trunk and placement of a stent of left SFA. Patient bcame somnolent post procedure and  confused.  Initial CT of the head is negative for acute stroke.  MRI of the brain without contrast  negative for acute stroke.  Patient seen and examined at bedside. she is agreeable to go to rehab facility  Assessment & Plan:   Active Problems:   HLD (hyperlipidemia)   OBSTRUCTIVE SLEEP APNEA   Ocular myasthenia gravis (HCC)   Hypomagnesemia   Social problem   Open wound of plantar aspect of foot   DM2 (diabetes mellitus, type 2) (HCC)   Nonhealing left heel wound and cellulitis Improving On admission patient was found to have purulent discharge and foul-smelling from the left heel wound. She recently completed treatment with ciprofloxacin for Serratia isolated from the wound cultures. Repeat wound cultures show Providentia sensitive to rocephin.  Discontinue Rocephin today. MRI of the foot does not show any osteomyelitis or abscess Vascular surgery consulted and she underwent aortogram, balloon angioplasty and left SFA stent. Started her on aspirin, plavix and continue with statin. Currently waiting for SNF placement.     Acute encephalopathy of unclear etiology/ metabolic encephalopathy.  Differentials include lethargy from  moderate sedation vs  encephalopathy from elevated ammonia levels. EEG does not show any acute abnormality, TSH wnl, vitamin b12 levels are wnl.  UA is slightly abnormal , but pt is already on IV cefepime transitioned to IV rocephin. .  Initial CT head negative, follow up with MRI of the brain does not show any acute stroke.  Neurology consulted, no further recommendations at this time.  Patient appears alert and oriented, no new complaints at this time.  Type 2 diabetes mellitus A1c is 4.9 Continue with sliding scale insulin. CBG (last 3)  Recent Labs    02/22/21 0609 02/22/21 1204 02/22/21 1658  GLUCAP 72 91 105*   No changes in medications    Lymphedema Worsening, with bilateral upper extremity edema.  She appears to be fluid overloaded.  Continue with IV Lasix.  Cxr does not show any pneumonia or pleural effusion or CHF.     Mild AKI With metabolic acidosis:  Renal parameters are improving   Hypokalemia:  Replaced.    Obstructive sleep apnea Continue with CPAP   Ocular myasthenia gravis Patient on pyridostigmine  Hyperlipidemia Resume statin.    Deconditioning and generalized weakness:  Therapy evaluations recommending SNF.    Upper extremity edema Venous duplex of the upper extremities is negative for DVT   Loose stools:  D/ced lactulose.    Anemia of blood loss superimposed on anemia of chronic disease Baseline hemoglobin appears to be around 9,  received a unit of PRBC transfusion on 02/20/2021. Repeat hemoglobin around 8 Anemia panel will be ordered for further evaluation.  Stool for occult blood will be ordered. Anemia  panel shows low folate levels, replaced.      DVT prophylaxis: Heparin Code Status: Full code Family Communication: (None at bedside.) Disposition:   Status is: Inpatient  Remains inpatient appropriate because:Ongoing diagnostic testing needed not appropriate for outpatient work up, Unsafe d/c plan, and IV treatments  appropriate due to intensity of illness or inability to take PO  Dispo: The patient is from: Home              Anticipated d/c is to: SNF              Patient currently is not medically stable to d/c.   Difficult to place patient No       Consultants:  Vascular surgery  Procedures: MRI of the brain Aortogram Antimicrobials:  Antibiotics Given (last 72 hours)     Date/Time Action Medication Dose Rate   02/19/21 2138 New Bag/Given   cefTRIAXone (ROCEPHIN) 2 g in sodium chloride 0.9 % 100 mL IVPB 2 g 200 mL/hr   02/20/21 2043 New Bag/Given   cefTRIAXone (ROCEPHIN) 2 g in sodium chloride 0.9 % 100 mL IVPB 2 g 200 mL/hr   02/21/21 2005 New Bag/Given   cefTRIAXone (ROCEPHIN) 2 g in sodium chloride 0.9 % 100 mL IVPB 2 g 200 mL/hr         Subjective:  No chest pain.    Objective: Vitals:   02/22/21 0425 02/22/21 0700 02/22/21 1205 02/22/21 1428  BP: (!) 124/54 (!) 117/48 (!) 111/48 (!) 127/54  Pulse: 86 80 86 93  Resp: 16 18 20 18   Temp: 97.8 F (36.6 C) 97.7 F (36.5 C) 98 F (36.7 C) 98.2 F (36.8 C)  TempSrc: Oral Oral Oral Oral  SpO2: 100% 100% 100% 99%  Weight:      Height:        Intake/Output Summary (Last 24 hours) at 02/22/2021 1811 Last data filed at 02/22/2021 1600 Gross per 24 hour  Intake 490 ml  Output 600 ml  Net -110 ml    Filed Weights   02/11/21 1909 02/16/21 0353 02/17/21 0300  Weight: 108.4 kg 105.9 kg 106.4 kg    Examination:  General exam: elderly lady, no distress noted.  Respiratory system: air entry fair, no wheezing , on RA.  Cardiovascular system: S1S2 RRR, PEDAL EDEMA present but improving.  Gastrointestinal system: Abdomen is soft, non tender bowel sounds wnl.  Central nervous system: Alert,answering questions appropriately.  Extremities: Bilateral upper extremity edema present Skin: left heel wound bandaged.  Psychiatry: Mood is appropriate.     Data Reviewed: I have personally reviewed following labs and imaging  studies  CBC: Recent Labs  Lab 02/16/21 0959 02/18/21 0436 02/20/21 1043 02/21/21 1142  WBC 19.0* 15.6* 13.1* 11.4*  NEUTROABS  --   --  8.8* 7.7  HGB 7.9* 7.2* 7.2* 8.0*  HCT 24.0* 21.8* 21.9* 24.3*  MCV 94.5 94.4 97.3 95.7  PLT 261 234 251 249     Basic Metabolic Panel: Recent Labs  Lab 02/18/21 0436 02/19/21 0440 02/20/21 1043 02/21/21 1142 02/22/21 0410  NA 142 143 144 143 141  K 3.2* 3.1* 4.4 5.3* 5.1  CL 115* 120* 118* 118* 115*  CO2 21* 19* 20* 22 22  GLUCOSE 134* 454* 86 99 80  BUN 32* 24* 21 19 18   CREATININE 1.17* 1.13* 1.27* 1.23* 1.16*  CALCIUM 8.5* 7.5* 8.1* 8.3* 8.2*     GFR: Estimated Creatinine Clearance: 53.9 mL/min (A) (by C-G formula based on SCr of 1.16 mg/dL (  H)).  Liver Function Tests: No results for input(s): AST, ALT, ALKPHOS, BILITOT, PROT, ALBUMIN in the last 168 hours.   CBG: Recent Labs  Lab 02/21/21 1623 02/21/21 2143 02/22/21 0609 02/22/21 1204 02/22/21 1658  GLUCAP 110* 167* 72 91 105*      Recent Results (from the past 240 hour(s))  Aerobic Culture w Gram Stain (superficial specimen)     Status: None   Collection Time: 02/13/21 12:13 PM   Specimen: Wound  Result Value Ref Range Status   Specimen Description WOUND  Final   Special Requests FOOT LEFT  Final   Gram Stain   Final    NO WBC SEEN MODERATE GRAM VARIABLE ROD RARE GRAM POSITIVE COCCI IN PAIRS Performed at [facility], [place] [address], [place], [place] [address]    Culture   Final    FEW MORAXELLA SPECIES BETA LACTAMASE NEGATIVE FEW PROVIDENCIA STUARTII    Report Status 02/18/2021 FINAL  Final   Organism ID, Bacteria PROVIDENCIA STUARTII  Final      Susceptibility   Providencia stuartii - MIC*    AMPICILLIN >=32 RESISTANT Resistant     CEFAZOLIN >=64 RESISTANT Resistant     CEFEPIME <=0.12 SENSITIVE Sensitive     CEFTAZIDIME <=1 SENSITIVE Sensitive     CEFTRIAXONE <=0.25 SENSITIVE Sensitive     CIPROFLOXACIN >=4 RESISTANT Resistant      GENTAMICIN RESISTANT Resistant     IMIPENEM 0.5 SENSITIVE Sensitive     TRIMETH/SULFA <=20 SENSITIVE Sensitive     AMPICILLIN/SULBACTAM >=32 RESISTANT Resistant     PIP/TAZO 8 SENSITIVE Sensitive     * FEW PROVIDENCIA STUARTII  Surgical pcr screen     Status: None   Collection Time: 02/14/21 10:06 PM   Specimen: Nasal Mucosa; Nasal Swab  Result Value Ref Range Status   MRSA, PCR NEGATIVE NEGATIVE Final   Staphylococcus aureus NEGATIVE NEGATIVE Final    Comment: (NOTE) The Xpert SA Assay (FDA approved for NASAL specimens in patients 92 years of age and older), is one component of a comprehensive surveillance program. It is not intended to diagnose infection nor to guide or monitor treatment. Performed at [facility], [place] [address] [place], [place] [address]   Urine Culture     Status: Abnormal   Collection Time: 02/15/21  2:04 AM   Specimen: Urine, Catheterized  Result Value Ref Range Status   Specimen Description URINE, CATHETERIZED  Final   Special Requests   Final    NONE Performed at [facility], [place] [address] [place], [place] [address]    Culture 30,000 COLONIES/mL YEAST (A)  Final   Report Status 02/17/2021 FINAL  Final  Culture, blood (routine x 2)     Status: None   Collection Time: 02/15/21 12:49 PM   Specimen: BLOOD  Result Value Ref Range Status   Specimen Description BLOOD RIGHT ANTECUBITAL  Final   Special Requests   Final    BOTTLES DRAWN AEROBIC ONLY Blood Culture adequate volume   Culture   Final    NO GROWTH 5 DAYS Performed at [facility], [place] [address], [place], [place] [address]    Report Status 02/20/2021 FINAL  Final  Culture, blood (routine x 2)     Status: None   Collection Time: 02/15/21 12:58 PM   Specimen: BLOOD LEFT HAND  Result Value Ref Range Status   Specimen Description BLOOD LEFT HAND  Final   Special Requests   Final  BOTTLES DRAWN AEROBIC ONLY Blood Culture results may not be optimal due to an inadequate  volume of blood received in culture bottles   Culture   Final    NO GROWTH 5 DAYS Performed at [facility], [place] [address] [place], [place] [address]    Report Status 02/20/2021 FINAL  Final          Radiology Studies: No results found.      Scheduled Meds:  aspirin EC  81 mg Oral Daily   atorvastatin  40 mg Oral Daily   busPIRone  5 mg Oral BID   Chlorhexidine Gluconate Cloth  6 each Topical Daily   clopidogrel  75 mg Oral Daily   collagenase   Topical BID   feeding supplement  237 mL Oral BID BM   folic acid  1 mg Oral Daily   furosemide  20 mg Intravenous BID   multivitamin with minerals  1 tablet Oral Daily   pantoprazole  40 mg Oral Daily   pyridostigmine  60 mg Oral TID   sodium chloride flush  10-40 mL Intracatheter Q12H   sodium chloride flush  3 mL Intravenous Q12H   Continuous Infusions:  sodium chloride     cefTRIAXone (ROCEPHIN)  IV 2 g (02/21/21 2005)     LOS: 11 days        Hosie Poisson, MD [facility]   To contact the attending provider between 7A-7P or the covering provider during after hours 7P-7A, please log into the web site [email or web address] and access using universal [place] password for that web site. If you do not have the password, please call the hospital operator.  02/22/2021, 6:11 PM

## 2021-02-22 NOTE — Progress Notes (Signed)
Patient refused CPAP tonight 

## 2021-02-22 NOTE — Progress Notes (Signed)
Physical Therapy Treatment Patient Details Name: [name] MRN: [id number] DOB: [birth year] Today's Date: 02/22/2021    History of Present Illness 70 yo admitted 02/11/21 to [facility] with left heel ulcer and ischemia. Transfer to [facility] 7/22. On 7/25 pt underwent angiography with SFA stent and Left tibioperoneal angioplasty. Pt with AMS/lethargy post procedure with head CT negative. PMhx: GERD, anxiety/depression, HTN, HLD, OSA, gout, lymphedema (self-reported), asthma.    PT Comments    Pt able to get EOB and OOB to chair from maximally elevated bed with one assist and stedy standing frame today.  She is not ready for gait, but possibly pre-gait and multiple sit to stands from stedy next session.  She was able to participate in bil UE and LE exercises once in the chair.  PT will continue to follow acutely for safe mobility progression.   Follow Up Recommendations  SNF     Equipment Recommendations  Wheelchair (measurements PT);Wheelchair cushion (measurements PT);3in1 (PT);Hospital bed    Recommendations for Other Services       Precautions / Restrictions Precautions Precautions: Fall Precaution Comments: bil UE edema, L heel wrapped    Mobility  Bed Mobility Overal bed mobility: Needs Assistance Bed Mobility: Supine to Sit;Rolling Rolling: Mod assist Sidelying to sit: Mod assist;HOB elevated       General bed mobility comments: Mod assist to help progress bil LEs to EOB, assist pt to roll towards R rail and support trunk to come up to sitting EOB.    Transfers Overall transfer level: Needs assistance   Transfers: Sit to/from Stand Sit to Stand: Mod assist;From elevated surface Stand pivot transfers: From elevated surface (seated on stedy standing frame)       General transfer comment: mod assist on second attempt with bed elevated to come to standing in stedy standing frame.  Pt transferred in sitting on the frame and stood again from elevated stedy seat with min  assist, uncontrolled descent to sit down in low recliner chair.  Ambulation/Gait                 Stairs             Wheelchair Mobility    Modified Rankin (Stroke Patients Only)       Balance Overall balance assessment: Needs assistance Sitting-balance support: Feet supported;Bilateral upper extremity supported Sitting balance-Leahy Scale: Fair Sitting balance - Comments: close supervision EOB once feet supported   Standing balance support: Bilateral upper extremity supported Standing balance-Leahy Scale: Poor Standing balance comment: needs external support from therapist and standing frame.                            Cognition Arousal/Alertness: Awake/alert Behavior During Therapy: WFL for tasks assessed/performed Overall Cognitive Status: No family/caregiver present to determine baseline cognitive functioning                                 General Comments: Not formally assessed, however, alert and following commands well today.  No delayed processing.      Exercises General Exercises - Upper Extremity Shoulder Flexion: AROM;Both;10 reps (to 90 degrees to weak to lift over head) Elbow Flexion: AROM;Both;10 reps Digit Composite Flexion: AROM;Both;10 reps General Exercises - Lower Extremity Ankle Circles/Pumps: AROM;Both;20 reps Long Arc Quad: AROM;Both;10 reps Hip Flexion/Marching: AROM;Both;10 reps    General Comments        Pertinent Vitals/Pain  Pain Assessment: Faces Faces Pain Scale: Hurts little more Pain Location: generalized with mobility, reporting bil foot pain. Pain Descriptors / Indicators: Grimacing;Guarding;Moaning Pain Intervention(s): Monitored during session;Limited activity within patient's tolerance;Repositioned    Home Living                      Prior Function            PT Goals (current goals can now be found in the care plan section) Acute Rehab PT Goals Patient Stated Goal: to get  stronger Progress towards PT goals: Progressing toward goals    Frequency    Min 2X/week      PT Plan Current plan remains appropriate    Co-evaluation              AM-PAC PT "6 Clicks" Mobility   Outcome Measure  Help needed turning from your back to your side while in a flat bed without using bedrails?: A Lot Help needed moving from lying on your back to sitting on the side of a flat bed without using bedrails?: A Lot Help needed moving to and from a bed to a chair (including a wheelchair)?: A Lot Help needed standing up from a chair using your arms (e.g., wheelchair or bedside chair)?: A Lot Help needed to walk in hospital room?: Total Help needed climbing 3-5 steps with a railing? : Total 6 Click Score: 10    End of Session   Activity Tolerance: Patient limited by fatigue Patient left: in chair;with call bell/phone within reach;with chair alarm set Nurse Communication: Other (comment) (RN in room at the end of the transfer) PT Visit Diagnosis: Other abnormalities of gait and mobility (R26.89);Muscle weakness (generalized) (M62.81);Other symptoms and signs involving the nervous system (H47.425)     Time: 9563-8756 PT Time Calculation (min) (ACUTE ONLY): 21 min  Charges:  $Therapeutic Exercise: 8-22 mins                     Verdene Lennert, PT, DPT  Acute Rehabilitation Ortho Tech Supervisor [phone] pager [phone]) [phone] office

## 2021-02-22 NOTE — Care Management Important Message (Signed)
Important Message  Patient Details  Name: [name] MRN: [id number] Date of Birth: May 29, 1951   Medicare Important Message Given:  Yes     Shelda Altes 02/22/2021, 10:07 AM

## 2021-02-22 NOTE — Progress Notes (Signed)
Pt's daughter [name]) requesting to be contacted when pt discharges and w/ any concerns related to discharge. She states her brother will be out of town this week, so he will be unavailable.

## 2021-02-23 ENCOUNTER — Inpatient Hospital Stay (HOSPITAL_COMMUNITY): Payer: Medicare Other

## 2021-02-23 DIAGNOSIS — D72829 Elevated white blood cell count, unspecified: Secondary | ICD-10-CM | POA: Diagnosis not present

## 2021-02-23 DIAGNOSIS — G7000 Myasthenia gravis without (acute) exacerbation: Secondary | ICD-10-CM | POA: Diagnosis not present

## 2021-02-23 DIAGNOSIS — E1159 Type 2 diabetes mellitus with other circulatory complications: Secondary | ICD-10-CM | POA: Diagnosis not present

## 2021-02-23 DIAGNOSIS — G4733 Obstructive sleep apnea (adult) (pediatric): Secondary | ICD-10-CM | POA: Diagnosis not present

## 2021-02-23 LAB — BASIC METABOLIC PANEL
Anion gap: 7 (ref 5–15)
BUN: 14 mg/dL (ref 8–23)
CO2: 23 mmol/L (ref 22–32)
Calcium: 8.2 mg/dL — ABNORMAL LOW (ref 8.9–10.3)
Chloride: 111 mmol/L (ref 98–111)
Creatinine, Ser: 1.07 mg/dL — ABNORMAL HIGH (ref 0.44–1.00)
GFR, Estimated: 56 mL/min — ABNORMAL LOW (ref >60)
Glucose, Bld: 69 mg/dL — ABNORMAL LOW (ref 70–99)
Potassium: 4.4 mmol/L (ref 3.5–5.1)
Sodium: 141 mmol/L (ref 135–145)

## 2021-02-23 LAB — GLUCOSE, CAPILLARY
Glucose-Capillary: 68 mg/dL — ABNORMAL LOW (ref 70–99)
Glucose-Capillary: 86 mg/dL (ref 70–99)
Glucose-Capillary: 87 mg/dL (ref 70–99)
Glucose-Capillary: 129 mg/dL — ABNORMAL HIGH (ref 70–99)
Glucose-Capillary: 103 mg/dL — ABNORMAL HIGH (ref 70–99)

## 2021-02-23 MED ORDER — COLCHICINE 0.6 MG PO TABS
0.6000 mg | ORAL_TABLET | Freq: Two times a day (BID) | ORAL | Status: DC
  Administered 2021-02-23 – 2021-02-24 (×3): 0.6 mg via ORAL
  Filled 2021-02-23 (×3): qty 1

## 2021-02-23 MED ORDER — MORPHINE SULFATE (PF) 2 MG/ML IV SOLN: 0.5000 mg | Freq: Once | INTRAVENOUS | Status: DC

## 2021-02-23 MED ORDER — ENSURE ENLIVE PO LIQD: 237.0000 mL | Freq: Two times a day (BID) | ORAL | 12 refills | Status: AC

## 2021-02-23 MED ORDER — GUAIFENESIN-DM 100-10 MG/5ML PO SYRP: 10.0000 mL | ORAL_SOLUTION | ORAL | 0 refills | Status: AC | PRN

## 2021-02-23 MED ORDER — COLLAGENASE 250 UNIT/GM EX OINT: TOPICAL_OINTMENT | Freq: Two times a day (BID) | CUTANEOUS | 0 refills | Status: AC

## 2021-02-23 MED ORDER — ENSURE ENLIVE PO LIQD
237.0000 mL | Freq: Three times a day (TID) | ORAL | Status: DC
  Administered 2021-02-24 (×2): 237 mL via ORAL

## 2021-02-23 MED ORDER — CLOPIDOGREL BISULFATE 75 MG PO TABS: 75.0000 mg | ORAL_TABLET | Freq: Every day | ORAL | 0 refills | Status: AC

## 2021-02-23 MED ORDER — ADULT MULTIVITAMIN W/MINERALS CH: 1.0000 | ORAL_TABLET | Freq: Every day | ORAL | Status: AC

## 2021-02-23 MED ORDER — FOLIC ACID 1 MG PO TABS: 1.0000 mg | ORAL_TABLET | Freq: Every day | ORAL | Status: AC

## 2021-02-23 NOTE — Progress Notes (Signed)
Nutrition Follow Up   DOCUMENTATION CODES:   Not applicable  INTERVENTION:   Assistance with all meals  Increase Ensure Enlive po TID, each supplement provides 350 kcal and 20 grams of protein Continue Magic cup TID with meals, each supplement provides 290 kcal and 9 grams of protein Continue MVI with minerals daily   NUTRITION DIAGNOSIS:   Increased nutrient needs related to wound healing as evidenced by estimated needs.  Ongoing  GOAL:   Patient will meet greater than or equal to 90% of their needs  Progressing   MONITOR:   PO intake, Supplement acceptance, Labs, Weight trends  REASON FOR ASSESSMENT:   Consult Wound healing  ASSESSMENT:   70 yo female admitted with nonhealing wound of left heel. PMH includes DM, chronic lymphedema, OSA, GERD  7/25- s/p balloon angioplasty L tibioperoneal trunk and stent of L SFA 7/27- mental status change, diet downgraded to D1/T 8/01- diet advanced to D3/T  Patient's mental status significantly improved. States her appetite is slow to progress but she is feeling better and is able to eat a little more each meal. Last four meal completions charted as 30%, 40%, 40%, and 25%. Taking Ensure 1-2 times daily and likes the taste. Willing to increase Ensure to TID.   Admission weight: 108.4 kg  Current weight: 106.4 kg   Medications: folic acid, 20 mg lasix BID Labs: CBG 68-115  Diet Order:   Diet Order             DIET DYS 3 Room service appropriate? No; Fluid consistency: Thin  Diet effective now                   EDUCATION NEEDS:   Education needs have been addressed  Skin:  Skin Assessment: Skin Integrity Issues: Skin Integrity Issues:: Other (Comment) Other: non healing wound on L heel  Last BM:  8/1  Height:   Ht Readings from Last 1 Encounters:  02/11/21 5\' 5"  (1.651 m)    Weight:   Wt Readings from Last 1 Encounters:  02/17/21 106.4 kg    BMI:  Body mass index is 39.03 kg/m.  Estimated  Nutritional Needs:   Kcal:  1750-1950 kcals  Protein:  105-115 g  Fluid:  >/= 1.8 L  Korra Christine MS, RD, LDN, CNSC Clinical Nutrition Pager listed in [place]

## 2021-02-23 NOTE — Progress Notes (Signed)
There was consult for  D/C PICC due to discharge to SNF. There wasn't discharge note. Talked patient's RN regarding this matter. Patient told that she didn't want to go today to rehab place. Talked patient's RN this matter and complete consult at this time. If patient is going to SNF, then patient's RN will put in the new order. HS [employer]

## 2021-02-23 NOTE — Progress Notes (Signed)
PROGRESS NOTE    [name]  HFW:[id number] DOB: 1951-02-20 DOA: 02/11/2021 PCP: Leslie Andrea, MD    Chief Complaint  Patient presents with   Leg Pain    Brief Narrative:  70 y/o female sent to ED from wound care center for worsening of L foot wound. There was concern for underlying osteomyelitis since per outpatient provider, wound could be probed to the bone. MRI foot did not show any osteomyelitis or abscess. ABI were 0.75, but likely falsely elevated. Since here wound was not healing despite wound care and antibiotics, she as seen by vascular and underwent aortogram with bilateral lower extremity runoff, s/p balloon angioplasty left tibioperoneal trunk and placement of a stent of left SFA. Patient bcame somnolent post procedure and  confused.  Initial CT of the head is negative for acute stroke.  MRI of the brain without contrast  negative for acute stroke.  Patient seen and examined at bedside. she is agreeable to go to rehab facility. This am,she reports worsening pain in the right great toe. Probably gouty flare up.   Assessment & Plan:   Active Problems:   HLD (hyperlipidemia)   OBSTRUCTIVE SLEEP APNEA   Ocular myasthenia gravis (HCC)   Hypomagnesemia   Social problem   Open wound of plantar aspect of foot   DM2 (diabetes mellitus, type 2) (HCC)   Nonhealing left heel wound and cellulitis Improving On admission patient was found to have purulent discharge and foul-smelling from the left heel wound. She recently completed treatment with ciprofloxacin for Serratia isolated from the wound cultures. Repeat wound cultures show Providentia sensitive to rocephin.  Discontinue Rocephin today. MRI of the foot does not show any osteomyelitis or abscess Vascular surgery consulted and she underwent aortogram, balloon angioplasty and left SFA stent. Started her on aspirin, plavix and continue with statin. Currently waiting for SNF placement.     Acute encephalopathy of  unclear etiology/ metabolic encephalopathy.  Differentials include lethargy from moderate sedation vs  encephalopathy from elevated ammonia levels. EEG does not show any acute abnormality, TSH wnl, vitamin b12 levels are wnl.  UA is slightly abnormal , but pt is already on IV cefepime transitioned to IV rocephin. .  Initial CT head negative, follow up with MRI of the brain does not show any acute stroke.  Neurology consulted, no further recommendations at this time.  Patient appears alert and oriented, no new complaints at this time.  Type 2 diabetes mellitus A1c is 4.9 Continue with sliding scale insulin. CBG (last 3)  Recent Labs    02/23/21 0634 02/23/21 0721 02/23/21 1126  GLUCAP 68* 86 87   No changes in medications    Lymphedema Worsening, with bilateral upper extremity edema.she has been diuresed well with IV lasix. Will continue another day of IV lasix and transition to oral lasix tomorrow.  Cxr does not show any pneumonia or pleural effusion or CHF.     Mild AKI With metabolic acidosis:  Renal parameters are improving. Creatinine is 1.07, BUN wnl.    Hypokalemia:  Replaced.    Obstructive sleep apnea Continue with CPAP   Ocular myasthenia gravis Patient on pyridostigmine  Hyperlipidemia Resume statin.    Deconditioning and generalized weakness:  Therapy evaluations recommending SNF.    Upper extremity edema Venous duplex of the upper extremities is negative for DVT   Loose stools:  D/ced lactulose.    Anemia of blood loss superimposed on anemia of chronic disease Baseline hemoglobin appears to be around 9,  received a unit of PRBC transfusion on 02/20/2021. Repeat hemoglobin around 8 Anemia panel will be ordered for further evaluation.  Stool for occult blood will be ordered. Anemia panel shows low folate levels, replaced.     Gout flare up of the right great toe.  X rays show Moderate osteoarthritis of the first MTP joint.  No acute  findings. Started her on colchicine and morphine.     DVT prophylaxis: Heparin Code Status: Full code Family Communication: (None at bedside.) Disposition:   Status is: Inpatient  Remains inpatient appropriate because:Ongoing diagnostic testing needed not appropriate for outpatient work up, Unsafe d/c plan, and IV treatments appropriate due to intensity of illness or inability to take PO  Dispo: The patient is from: Home              Anticipated d/c is to: SNF              Patient currently is not medically stable to d/c.   Difficult to place patient No       Consultants:  Vascular surgery  Procedures: MRI of the brain Aortogram Antimicrobials:  Antibiotics Given (last 72 hours)     Date/Time Action Medication Dose Rate   02/20/21 2043 New Bag/Given   cefTRIAXone (ROCEPHIN) 2 g in sodium chloride 0.9 % 100 mL IVPB 2 g 200 mL/hr   02/21/21 2005 New Bag/Given   cefTRIAXone (ROCEPHIN) 2 g in sodium chloride 0.9 % 100 mL IVPB 2 g 200 mL/hr   02/22/21 2001 New Bag/Given   cefTRIAXone (ROCEPHIN) 2 g in sodium chloride 0.9 % 100 mL IVPB 2 g 200 mL/hr         Subjective: SEVERE RIGHT TOE PAIN.    Objective: Vitals:   02/22/21 2310 02/23/21 0100 02/23/21 0320 02/23/21 1127  BP: (!) 129/54  (!) 123/45 (!) 142/50  Pulse: 80 81 82 85  Resp: 18  16 18   Temp: 97.7 F (36.5 C)  98 F (36.7 C) 97.8 F (36.6 C)  TempSrc: Oral  Oral Oral  SpO2: 100% 91% 98% 99%  Weight:      Height:        Intake/Output Summary (Last 24 hours) at 02/23/2021 1644 Last data filed at 02/23/2021 3267 Gross per 24 hour  Intake 320 ml  Output 1050 ml  Net -730 ml    Filed Weights   02/11/21 1909 02/16/21 0353 02/17/21 0300  Weight: 108.4 kg 105.9 kg 106.4 kg    Examination:  General exam: elderly lady, ill appearing, not in distress.  Respiratory system: clear to auscultation, no wheezing heard.  Cardiovascular system: S1S2, RRR, noJVD, pedal edema improving.  Gastrointestinal  system: Abdomen is soft, NT ND BS+ Central nervous system: Alert and oriented, grossly non focal.  Extremities: Bil upper extremity edema is improving.  Skin: left heel wound bandaged. Tender right great toe.  Psychiatry: slightly anxious and does not want to go to SNF until her toe pain is resolved.     Data Reviewed: I have personally reviewed following labs and imaging studies  CBC: Recent Labs  Lab 02/18/21 0436 02/20/21 1043 02/21/21 1142  WBC 15.6* 13.1* 11.4*  NEUTROABS  --  8.8* 7.7  HGB 7.2* 7.2* 8.0*  HCT 21.8* 21.9* 24.3*  MCV 94.4 97.3 95.7  PLT 234 251 249     Basic Metabolic Panel: Recent Labs  Lab 02/19/21 0440 02/20/21 1043 02/21/21 1142 02/22/21 0410 02/23/21 0600  NA 143 144 143 141 141  K 3.1* 4.4  5.3* 5.1 4.4  CL 120* 118* 118* 115* 111  CO2 19* 20* 22 22 23   GLUCOSE 454* 86 99 80 69*  BUN 24* 21 19 18 14   CREATININE 1.13* 1.27* 1.23* 1.16* 1.07*  CALCIUM 7.5* 8.1* 8.3* 8.2* 8.2*     GFR: Estimated Creatinine Clearance: 58.5 mL/min (A) (by C-G formula based on SCr of 1.07 mg/dL (H)).  Liver Function Tests: No results for input(s): AST, ALT, ALKPHOS, BILITOT, PROT, ALBUMIN in the last 168 hours.   CBG: Recent Labs  Lab 02/22/21 1658 02/22/21 2129 02/23/21 0634 02/23/21 0721 02/23/21 1126  GLUCAP 105* 115* 68* 86 87      Recent Results (from the past 240 hour(s))  Surgical pcr screen     Status: None   Collection Time: 02/14/21 10:06 PM   Specimen: Nasal Mucosa; Nasal Swab  Result Value Ref Range Status   MRSA, PCR NEGATIVE NEGATIVE Final   Staphylococcus aureus NEGATIVE NEGATIVE Final    Comment: (NOTE) The Xpert SA Assay (FDA approved for NASAL specimens in patients 60 years of age and older), is one component of a comprehensive surveillance program. It is not intended to diagnose infection nor to guide or monitor treatment. Performed at [facility], [place] [address], [place], [place] [address]   Urine Culture      Status: Abnormal   Collection Time: 02/15/21  2:04 AM   Specimen: Urine, Catheterized  Result Value Ref Range Status   Specimen Description URINE, CATHETERIZED  Final   Special Requests   Final    NONE Performed at [facility], [place] [address], [place], [place] [address]    Culture 30,000 COLONIES/mL YEAST (A)  Final   Report Status 02/17/2021 FINAL  Final  Culture, blood (routine x 2)     Status: None   Collection Time: 02/15/21 12:49 PM   Specimen: BLOOD  Result Value Ref Range Status   Specimen Description BLOOD RIGHT ANTECUBITAL  Final   Special Requests   Final    BOTTLES DRAWN AEROBIC ONLY Blood Culture adequate volume   Culture   Final    NO GROWTH 5 DAYS Performed at [facility], [place] [address], [place], [place] [address]    Report Status 02/20/2021 FINAL  Final  Culture, blood (routine x 2)     Status: None   Collection Time: 02/15/21 12:58 PM   Specimen: BLOOD LEFT HAND  Result Value Ref Range Status   Specimen Description BLOOD LEFT HAND  Final   Special Requests   Final    BOTTLES DRAWN AEROBIC ONLY Blood Culture results may not be optimal due to an inadequate volume of blood received in culture bottles   Culture   Final    NO GROWTH 5 DAYS Performed at [facility], [place] [address], [place], [place] [address]    Report Status 02/20/2021 FINAL  Final  SARS CORONAVIRUS 2 (TAT 6-24 HRS) Nasopharyngeal Nasopharyngeal Swab     Status: None   Collection Time: 02/22/21 12:44 PM   Specimen: Nasopharyngeal Swab  Result Value Ref Range Status   SARS Coronavirus 2 NEGATIVE NEGATIVE Final    Comment: (NOTE) SARS-CoV-2 target nucleic acids are NOT DETECTED.  The SARS-CoV-2 RNA is generally detectable in upper and lower respiratory specimens during the acute phase of infection. Negative results do not preclude SARS-CoV-2 infection, do not rule out co-infections with other pathogens, and should not be used as the sole basis for treatment or other  patient  management decisions. Negative results must be combined with clinical observations, patient history, and epidemiological information. The expected result is Negative.  Fact Sheet for Patients: SugarRoll.be  Fact Sheet for Healthcare Providers: https://www.woods-mathews.com/  This test is not yet approved or cleared by the [place] FDA and  has been authorized for detection and/or diagnosis of SARS-CoV-2 by FDA under an Emergency Use Authorization (EUA). This EUA will remain  in effect (meaning this test can be used) for the duration of the COVID-19 declaration under Se ction 564(b)(1) of the Act, 21 U.S.C. section 360bbb-3(b)(1), unless the authorization is terminated or revoked sooner.  Performed at [facility], [place] [address] [place], [place] [address]           Radiology Studies: DG Toe Great Right  Result Date: 02/23/2021 CLINICAL DATA:  Sudden onset right great toe pain.  No known injury. EXAM: RIGHT GREAT TOE COMPARISON:  07/11/2020 FINDINGS: No acute fracture or dislocation. Moderate osteoarthritis of the first MTP joint. No erosion or periosteal elevation. No focal soft tissue abnormality. IMPRESSION: Moderate osteoarthritis of the first MTP joint.  No acute findings. Electronically Signed   By: Davina Poke D.O.   On: 02/23/2021 13:17        Scheduled Meds:  aspirin EC  81 mg Oral Daily   atorvastatin  40 mg Oral Daily   busPIRone  5 mg Oral BID   Chlorhexidine Gluconate Cloth  6 each Topical Daily   clopidogrel  75 mg Oral Daily   colchicine  0.6 mg Oral BID   collagenase   Topical BID   feeding supplement  237 mL Oral TID BM   folic acid  1 mg Oral Daily   furosemide  20 mg Intravenous BID    morphine injection  0.5 mg Intravenous Once   multivitamin with minerals  1 tablet Oral Daily   pantoprazole  40 mg Oral Daily   pyridostigmine  60 mg Oral TID   sodium chloride flush  10-40 mL Intracatheter  Q12H   sodium chloride flush  3 mL Intravenous Q12H   Continuous Infusions:  sodium chloride       LOS: 12 days        Hosie Poisson, MD [facility]   To contact the attending provider between 7A-7P or the covering provider during after hours 7P-7A, please log into the web site [email or web address] and access using universal [place] password for that web site. If you do not have the password, please call the hospital operator.  02/23/2021, 4:44 PM

## 2021-02-23 NOTE — Progress Notes (Signed)
Hypoglycemic Event  CBG: 68  Treatment: 4 oz gingerale  Symptoms: none  Follow-up CBG: Time: 86  CBG Result: 0710  Possible Reasons for Event: low PO intake  Comments/MD notified: n/a    Jaymes Graff

## 2021-02-23 NOTE — TOC Progression Note (Signed)
Transition of Care [facility]) - Progression Note    Patient Details  Name: [name] MRN: [id number] Date of Birth: 1951-06-16  Transition of Care [facility]) CM/SW [place], [place] Phone Number: 02/23/2021, 11:45 AM  Clinical Narrative:     Patient's insurance approved for SNF  reference # [id number] for [place] confirmed w/ [facility] placement.  CSW will continue to follow and assist with discharge planning.  Thurmond Butts, MSW, LCSW Clinical Social Worker     Expected Discharge Plan: Skilled Nursing Facility Barriers to Discharge: Ship broker, Continued Medical Work up, SNF Pending bed offer  Expected Discharge Plan and Services Expected Discharge Plan: [place] In-house Referral: Clinical Social Work Discharge Planning Services: CM Consult   Living arrangements for the past 2 months: Single Family Home                                       Social Determinants of Health (SDOH) Interventions    Readmission Risk Interventions Readmission Risk Prevention Plan 02/12/2021 10/20/2020 10/04/2020  Transportation Screening Complete Complete Complete  PCP or Specialist Appt within 5-7 Days - - Complete  Home Care Screening - - Complete  Medication Review (RN CM) - - Complete  HRI or [place] - Complete -  Social Work Consult for [place] Planning/Counseling - Complete -  Palliative Care Screening - Not Applicable -  Medication Review Press photographer) Complete Complete -  [place] or Home Care Consult Complete - -  SW Recovery Care/Counseling Consult Complete - -  Palliative Care Screening Not Applicable - -  [place] Not Applicable - -  Some recent data might be hidden

## 2021-02-23 NOTE — Progress Notes (Signed)
RT offered pt CPAP for the night and pt declined at this time. RT will continue to monitor.  

## 2021-02-23 NOTE — Progress Notes (Signed)
Pt complaining of new pain (10/10) in rt great toe this am. Bilat DP are dopplerable and cap refill <3s. Administered tylenol. MD notified.  Sahra Converse A Kodah Maret

## 2021-02-24 DIAGNOSIS — E1159 Type 2 diabetes mellitus with other circulatory complications: Secondary | ICD-10-CM | POA: Diagnosis not present

## 2021-02-24 DIAGNOSIS — G4733 Obstructive sleep apnea (adult) (pediatric): Secondary | ICD-10-CM | POA: Diagnosis not present

## 2021-02-24 LAB — BASIC METABOLIC PANEL
Anion gap: 5 (ref 5–15)
BUN: 13 mg/dL (ref 8–23)
CO2: 26 mmol/L (ref 22–32)
Calcium: 8.1 mg/dL — ABNORMAL LOW (ref 8.9–10.3)
Chloride: 110 mmol/L (ref 98–111)
Creatinine, Ser: 0.97 mg/dL (ref 0.44–1.00)
GFR, Estimated: >60 mL/min (ref >60)
Glucose, Bld: 72 mg/dL (ref 70–99)
Potassium: 4.2 mmol/L (ref 3.5–5.1)
Sodium: 141 mmol/L (ref 135–145)

## 2021-02-24 LAB — GLUCOSE, CAPILLARY: Glucose-Capillary: 74 mg/dL (ref 70–99)

## 2021-02-24 MED ORDER — ENSURE ENLIVE PO LIQD: 237.0000 mL | Freq: Three times a day (TID) | ORAL | 12 refills | Status: AC

## 2021-02-24 MED ORDER — COLCHICINE 0.6 MG PO TABS: 0.6000 mg | ORAL_TABLET | Freq: Every day | ORAL | 0 refills | Status: AC

## 2021-02-24 NOTE — TOC Transition Note (Signed)
Transition of Care [facility]) - CM/SW Discharge Note   Patient Details  Name: [name] MRN: [id number] Date of Birth: [birth year]  [facility] CM/SW Contact:  Vinie Sill, LCSW Phone Number: 02/24/2021, 11:22 AM   Clinical Narrative:     Patient will Discharge to: [facility]  Discharge Date: 02/24/2021 Family Notified: daughter in [place] By: Corey Harold  Per MD patient is ready for discharge. RN, patient, and facility notified of discharge. Discharge Summary sent to facility. RN given number for [phone], room B16-1. Ambulance transport requested for patient.   Clinical Social Worker signing off. Thurmond Butts, MSW, LCSW Clinical Social Worker     Final next level of care: Skilled Nursing Facility Barriers to Discharge: Barriers Resolved   Patient Goals and CMS Choice Patient states their goals for this hospitalization and ongoing recovery are:: Return home CMS Medicare.gov Compare Post Acute Care list provided to:: Patient Choice offered to / list presented to : Patient  Discharge Placement              Patient chooses bed at:  [facility] health) Patient to be transferred to facility by: [place] Name of family member notified: daughter in law, Tameka Patient and family notified of of transfer: 02/24/21  Discharge Plan and Services In-house Referral: Clinical Social Work Discharge Planning Services: CM Consult                                 Social Determinants of Health ([place]) Interventions     Readmission Risk Interventions Readmission Risk Prevention Plan 02/12/2021 10/20/2020 10/04/2020  Transportation Screening Complete Complete Complete  PCP or Specialist Appt within 5-7 Days - - Complete  Home Care Screening - - Complete  Medication Review (RN CM) - - Complete  HRI or Home Care Consult - Complete -  Social Work Consult for Recovery Care Planning/Counseling - Complete -  Palliative Care Screening - Not  Applicable -  Medication Review Press photographer) Complete Complete -  [place] or Home Care Consult Complete - -  SW Recovery Care/Counseling Consult Complete - -  Palliative Care Screening Not Applicable - -  [place] Not Applicable - -  Some recent data might be hidden

## 2021-02-24 NOTE — Progress Notes (Signed)
Mobility Specialist: Progress Note   02/24/21 1116  Mobility  Activity Transferred:  Chair to bed  Level of Assistance Moderate assist, patient does 50-74%  Assistive Device Stedy  Mobility Out of bed to chair with meals  Mobility Response Tolerated well  Mobility performed by Mobility specialist  $Mobility charge 1 Mobility   Pre-Mobility: 78 HR Post-Mobility: 76 HR  Pt assisted back to bed per RN request. Pt required heavy modA to stand from the chair as well as verbal cues for hand placement. Pt required minA to get her legs back into the bed as well. Pt has call bell and phone at her side and bed alarm is on.   [facility] Tyniah Kastens Mobility Specialist Mobility Specialist Phone: [phone]

## 2021-02-24 NOTE — Progress Notes (Signed)
Occupational Therapy Treatment Patient Details Name: [name] MRN: [id number] DOB: 04/06/1951 Today's Date: 02/24/2021    History of present illness 70 yo admitted 02/11/21 to [facility] with left heel ulcer and ischemia. Transfer to [facility] 7/22. On 7/25 pt underwent angiography with SFA stent and Left tibioperoneal angioplasty. Pt with AMS/lethargy post procedure with head CT negative. PMhx: GERD, anxiety/depression, HTN, HLD, OSA, gout, lymphedema (self-reported), asthma.   OT comments  NT requesting OT assist for toilet transfer with pt. With use of Stedy, pt overall Mod A x 2 for sit to stands from recliner <> [facility] transfer. Pt requires max encouragement to demo muscle activation to achieve optimal transfers. Pt was noted to take steps back on Stedy and may benefit from RW trial in next session. DC recs remain appropriate.   Follow Up Recommendations  SNF;Supervision/Assistance - 24 hour    Equipment Recommendations  Hospital bed    Recommendations for Other Services      Precautions / Restrictions Precautions Precautions: Fall Precaution Comments: bil UE edema, L heel wrapped Restrictions Weight Bearing Restrictions: No       Mobility Bed Mobility Overal bed mobility: Needs Assistance Bed Mobility: Supine to Sit     Supine to sit: Mod assist;HOB elevated     General bed mobility comments: received in recliner    Transfers Overall transfer level: Needs assistance Equipment used: Ambulation equipment used Transfers: Sit to/from Stand Sit to Stand: Mod assist;+2 safety/equipment;+2 physical assistance         General transfer comment: Mod A x 2 overall for sit to stand from recliner and BSC. Pt did require at least 3 attempts due to poor efforts to achieve standing    Balance Overall balance assessment: Needs assistance Sitting-balance support: Feet supported;Bilateral upper extremity supported Sitting balance-Leahy Scale: Fair     Standing balance support:  Bilateral upper extremity supported Standing balance-Leahy Scale: Poor                             ADL either performed or assessed with clinical judgement   ADL Overall ADL's : Needs assistance/impaired Eating/Feeding: Set up;Sitting Eating/Feeding Details (indicate cue type and reason): setup sitting in recliner, assist to open containers. able to use regular utensils. B hand swelling much improved Grooming: Set up;Sitting;Wash/dry face Grooming Details (indicate cue type and reason): sitting EOB         Upper Body Dressing : Minimal assistance;Sitting Upper Body Dressing Details (indicate cue type and reason): Min A to don gown around back     Toilet Transfer: BSC;Moderate assistance;+2 for safety/equipment;+2 for physical assistance Toilet Transfer Details (indicate cue type and reason): Use of Stedy for transfer to/from [place] and Hygiene: Total assistance;Sit to/from stand Toileting - Clothing Manipulation Details (indicate cue type and reason): Total A for hygiene standing in [place]       General ADL Comments: requires increased encouragement to assist with transfers     Vision   Vision Assessment?: No apparent visual deficits   Perception     Praxis      Cognition Arousal/Alertness: Awake/alert Behavior During Therapy: Flat affect Overall Cognitive Status: No family/caregiver present to determine baseline cognitive functioning Area of Impairment: Orientation;Attention;Following commands;Awareness;Safety/judgement;Problem solving;Memory                 Orientation Level: Disoriented to;Situation;Time Current Attention Level: Selective Memory: Decreased short-term memory Following Commands: Follows one step commands with increased time Safety/Judgement:  Decreased awareness of safety;Decreased awareness of deficits Awareness: Emergent Problem Solving: Slow processing;Difficulty sequencing;Requires verbal  cues;Requires tactile cues General Comments: more talkative with appropriate answers, delayed processing at times. decreased initiation of tasks (self limiting vs motor planning?)        Exercises     Shoulder Instructions       General Comments VSS on RA    Pertinent Vitals/ Pain       Pain Assessment: No/denies pain Pain Intervention(s): Monitored during session  Home Living                                          Prior Functioning/Environment              Frequency  Min 2X/week        Progress Toward Goals  OT Goals(current goals can now be found in the care plan section)  Progress towards OT goals: Progressing toward goals  Acute Rehab OT Goals Patient Stated Goal: to get stronger OT Goal Formulation: Patient unable to participate in goal setting Time For Goal Achievement: 03/02/21 Potential to Achieve Goals: Fair ADL Goals Pt Will Perform Eating: with modified independence;with adaptive utensils;sitting Pt Will Perform Grooming: with min assist;sitting Pt Will Perform Upper Body Bathing: with mod assist;sitting Pt Will Perform Lower Body Bathing: with max assist;sit to/from stand;sitting/lateral leans Additional ADL Goal #1: Pt to demo bed mobility with Min A in prep for ADL transfers  Plan Discharge plan remains appropriate;Frequency remains appropriate    Co-evaluation                 AM-PAC OT "6 Clicks" Daily Activity     Outcome Measure   Help from another person eating meals?: A Little Help from another person taking care of personal grooming?: A Little Help from another person toileting, which includes using toliet, bedpan, or urinal?: Total Help from another person bathing (including washing, rinsing, drying)?: Total Help from another person to put on and taking off regular upper body clothing?: A Little Help from another person to put on and taking off regular lower body clothing?: Total 6 Click Score: 12    End  of Session Equipment Utilized During Treatment: Gait belt;Other (comment) Charlaine Dalton)  OT Visit Diagnosis: Unsteadiness on feet (R26.81);Other abnormalities of gait and mobility (R26.89);Muscle weakness (generalized) (M62.81);Other symptoms and signs involving cognitive function   Activity Tolerance Patient tolerated treatment well   Patient Left in chair;with call bell/phone within reach;with chair alarm set;with nursing/sitter in room   Nurse Communication Mobility status        Time: 3536-1443 OT Time Calculation (min): 22 min  Charges: OT General Charges $OT Visit: 1 Visit OT Treatments $Self Care/Home Management : 8-22 mins $Therapeutic Activity: 8-22 mins  Malachy Chamber, OTR/L Acute Rehab Services Office: [phone]    Layla Maw 02/24/2021, 8:51 AM

## 2021-02-24 NOTE — Progress Notes (Signed)
  Speech Language Pathology Treatment: Dysphagia  Patient Details Name: [name] MRN: [id number] DOB: [birth year] Today's Date: 02/24/2021 Time: [id number] SLP Time Calculation (min) (ACUTE ONLY): 10 min  Assessment / Plan / Recommendation Clinical Impression  Pt was seen for dysphagia treatment. She was alert and her dinner tray was present with a limited amount of food missing. Pt reported that she has been tolerating the upgraded diet without overt s/sx of aspiration or any difficulty with mastication. Pt refused all solids stating that she was too full to eat anything else and has had enough. Pt was reclined and positioning was suboptimal, but pt requested that she not be seated fully upright. She tolerated thin liquids via straw without overt s/sx of aspiration despite her poor positioning. Pt was educated regarding swallowing precautions and her increased risk of aspiration when eating/drinking reclined and she verbalized understanding. Pt's dysphagia 3 diet and thin liquids will be continued at this time.   HPI HPI: 70 yo admitted 7/21 to [facility] with left heel ulcer and ischemia. Transfer to [facility] 7/22. 7/25 angiography with SFA stent and Left tibioperoneal angioplasty. Pt with AMS post procedure with head CT and MRI negative.  PMhx: GERD, anxiety/depression, HTN, HLD, OSA, gout, asthma      SLP Plan  Continue with current plan of care       Recommendations  Diet recommendations: Dysphagia 3 (mechanical soft);Thin liquid Liquids provided via: Cup;Straw Medication Administration: Crushed with puree Supervision: Staff to assist with self feeding Compensations: Minimize environmental distractions;Slow rate;Small sips/bites Postural Changes and/or Swallow Maneuvers: Seated upright 90 degrees                Oral Care Recommendations: Oral care BID Follow up Recommendations: Skilled Nursing facility SLP Visit Diagnosis: Dysphagia, oral phase (R13.11) Plan: Continue with current  plan of care       Pricila Bridge I. Hardin Negus, [place], [place] Office number [phone] Pager [phone]                 Horton Marshall 02/24/2021, 4:26 PM

## 2021-02-24 NOTE — NC FL2 (Signed)
[place] LEVEL OF CARE SCREENING TOOL     IDENTIFICATION  Patient Name: [name] Birthdate: Feb 11, 1951 Sex: female Admission Date (Current Location): 02/11/2021  [facility] and [place] Number:  Herbalist and Address:  [facility] [place] [facility], [place] [address] [place], [place] [address]      Provider Number: [id number]  Attending Physician Name and Address:  Hosie Poisson, MD  Relative Name and Phone Number:       Current Level of Care: Hospital Recommended Level of Care: [place] Prior Approval Number:    Date Approved/Denied:   PASRR Number: Under reveiw  Discharge Plan: SNF    Current Diagnoses: Patient Active Problem List   Diagnosis Date Noted   Hypomagnesemia 02/12/2021   Social problem 02/12/2021   DM2 (diabetes mellitus, type 2) ([place]) 02/12/2021   Open wound of plantar aspect of foot    Osteomyelitis ([place]) 02/11/2021   Abscess of right lower extremity excluding foot 10/18/2020   Abscess of right leg 10/18/2020   Dehydration 10/03/2020   Leukocytosis 10/03/2020   Thrombocytosis 10/03/2020   Hyponatremia 10/03/2020   Hypochloremia 10/03/2020   Failure to thrive in adult 10/03/2020   Acute renal failure superimposed on stage 3b chronic kidney disease ([place]) 10/03/2020   Uncontrolled type 2 diabetes mellitus with hyperglycemia, without long-term current use of insulin ([place]) 10/03/2020   Acute kidney injury superimposed on CKD ([place]) 10/02/2020   Chronic kidney disease 10/01/2020   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[place]) 05/12/2020   Chronic insomnia 01/08/2020   Ocular myasthenia gravis ([place]) 09/18/2019   Peripheral edema 01/23/2019   Asthma 05/14/2015   Heel spur 02/24/2015   Depression 02/24/2015   Post-menopausal bleeding 06/02/2014   OA (osteoarthritis) of knee 04/09/2014   Epidermal cyst 10/01/2013   Class 3 obesity 02/25/2013   Back pain 03/04/2012   Gout 01/25/2012   Edema 01/28/2011    Obesity hypoventilation syndrome ([place]) 06/16/2008   OBSTRUCTIVE SLEEP APNEA 05/08/2008   HLD (hyperlipidemia) 08/28/2006   Anxiety 08/28/2006   Essential hypertension 08/28/2006   GERD 08/28/2006   Osteoarthritis 08/28/2006    Orientation RESPIRATION BLADDER Height & Weight     Self  O2 (patient uses CPAP) External catheter, Incontinent Weight: 234 lb 9.1 oz (106.4 kg) Height:  5\' 5"  (165.1 cm)  BEHAVIORAL SYMPTOMS/MOOD NEUROLOGICAL BOWEL NUTRITION STATUS      Incontinent Diet (please see discharge planning)  AMBULATORY STATUS COMMUNICATION OF NEEDS Skin   Limited Assist Verbally Other (Comment) (wound incision Left Heel)                       Personal Care Assistance Level of Assistance  Bathing, Feeding, Dressing Bathing Assistance: Limited assistance Feeding assistance: Limited assistance Dressing Assistance: Limited assistance     Functional Limitations Info  Sight, Hearing, Speech Sight Info: Adequate Hearing Info: Adequate      SPECIAL CARE FACTORS FREQUENCY  PT (By licensed PT), OT (By licensed OT)     PT Frequency: 5x per week OT Frequency: 5x per week            Contractures Contractures Info: Not present    Additional Factors Info  Code Status, Allergies, Psychotropic Code Status Info: FULL Code Allergies Info: NKA Psychotropic Info: busPIRone (BUSPAR) tablet 5 mg         Current Medications (02/24/2021):  This is the current hospital active medication list Current Facility-Administered Medications  Medication Dose Route Frequency Provider Last Rate  Last Admin   0.9 %  sodium chloride infusion  250 mL Intravenous PRN Waynetta Sandy, MD       acetaminophen (TYLENOL) tablet 650 mg  650 mg Oral Q6H PRN Kathie Dike, MD   650 mg at 02/24/21 2119   Or   acetaminophen (TYLENOL) suppository 650 mg  650 mg Rectal Q6H PRN Kathie Dike, MD       acetaminophen (TYLENOL) tablet 650 mg  650 mg Oral Q4H PRN Waynetta Sandy, MD    650 mg at 02/23/21 2313   albuterol (PROVENTIL) (2.5 MG/3ML) 0.083% nebulizer solution 2.5 mg  2.5 mg Nebulization Q4H PRN Waynetta Sandy, MD       aspirin EC tablet 81 mg  81 mg Oral Daily Hosie Poisson, MD   81 mg at 02/24/21 0847   atorvastatin (LIPITOR) tablet 40 mg  40 mg Oral Daily Waynetta Sandy, MD   40 mg at 02/24/21 0847   busPIRone (BUSPAR) tablet 5 mg  5 mg Oral BID Waynetta Sandy, MD   5 mg at 02/24/21 4174   Chlorhexidine Gluconate Cloth 2 % PADS 6 each  6 each Topical Daily Hosie Poisson, MD   6 each at 02/23/21 1713   clopidogrel (PLAVIX) tablet 75 mg  75 mg Oral Daily Hosie Poisson, MD   75 mg at 02/24/21 0847   colchicine tablet 0.6 mg  0.6 mg Oral BID Hosie Poisson, MD   0.6 mg at 02/24/21 0847   collagenase (SANTYL) ointment   Topical BID Waynetta Sandy, MD   Given at 02/24/21 [phone]   feeding supplement (ENSURE ENLIVE / ENSURE PLUS) liquid 237 mL  237 mL Oral TID BM Hosie Poisson, MD   237 mL at 48/18/56 3149   folic acid (FOLVITE) tablet 1 mg  1 mg Oral Daily Hosie Poisson, MD   1 mg at 02/24/21 0847   furosemide (LASIX) injection 20 mg  20 mg Intravenous BID Hosie Poisson, MD   20 mg at 02/24/21 0905   guaiFENesin-dextromethorphan (ROBITUSSIN DM) 100-10 MG/5ML syrup 10 mL  10 mL Oral Q4H PRN Hosie Poisson, MD   10 mL at 02/23/21 2008   hydrALAZINE (APRESOLINE) injection 5 mg  5 mg Intravenous Q20 Min PRN Waynetta Sandy, MD       labetalol (NORMODYNE) injection 10 mg  10 mg Intravenous Q10 min PRN Waynetta Sandy, MD       morphine 2 MG/ML injection 0.5 mg  0.5 mg Intravenous Once Hosie Poisson, MD       multivitamin with minerals tablet 1 tablet  1 tablet Oral Daily Waynetta Sandy, MD   1 tablet at 02/24/21 0847   naloxone [facility]) injection 0.4 mg  0.4 mg Intravenous PRN Shela Leff, MD   0.4 mg at 02/15/21 2210   ondansetron (ZOFRAN) tablet 4 mg  4 mg Oral Q6H PRN Waynetta Sandy, MD        Or   ondansetron [facility]) injection 4 mg  4 mg Intravenous Q6H PRN Waynetta Sandy, MD   4 mg at 02/13/21 1829   pantoprazole (PROTONIX) EC tablet 40 mg  40 mg Oral Daily Waynetta Sandy, MD   40 mg at 02/24/21 0849   polyethylene glycol (MIRALAX / GLYCOLAX) packet 17 g  17 g Oral Daily PRN Waynetta Sandy, MD       pyridostigmine (MESTINON) tablet 60 mg  60 mg Oral TID Waynetta Sandy, MD   60 mg  at 02/24/21 0848   sodium chloride flush (NS) 0.9 % injection 10-40 mL  10-40 mL Intracatheter Q12H Hosie Poisson, MD   20 mL at 02/23/21 2000   sodium chloride flush (NS) 0.9 % injection 10-40 mL  10-40 mL Intracatheter PRN Hosie Poisson, MD       sodium chloride flush (NS) 0.9 % injection 3 mL  3 mL Intravenous Q12H Waynetta Sandy, MD   3 mL at 02/23/21 0843   sodium chloride flush (NS) 0.9 % injection 3 mL  3 mL Intravenous PRN Waynetta Sandy, MD   3 mL at 02/20/21 2045   traZODone (DESYREL) tablet 100 mg  100 mg Oral QHS PRN Waynetta Sandy, MD   100 mg at 02/13/21 2149     Discharge Medications: Please see discharge summary for a list of discharge medications.  Relevant Imaging Results:  Relevant Lab Results:   Additional Information SSN [id number]  Moderna COVID-19 Vaccine 04/02/2020 , 03/01/2020  patient uses cpap- full face mask, medium, Respiratory Rate 16 EPAP 8 Flow  Rate 2  Vinie Sill, LCSW

## 2021-02-24 NOTE — Discharge Summary (Signed)
Physician Discharge Summary  [name] TML:[id number] DOB: 17-Jan-1951 DOA: 02/11/2021  PCP: Leslie Andrea, MD  Admit date: 02/11/2021 Discharge date: 02/24/2021  Admitted From: home.  Disposition:  SNF  Recommendations for Outpatient Follow-up:  Follow up with PCP in 1-2 weeks Please obtain BMP/CBC in one week Please follow up withvascular surgery as recommended.  Please follow up wound care for the left heel.   Discharge Condition:stable.  CODE STATUS:Full code.  Diet recommendation: Heart Healthy / Carb Modified    Brief/Interim Summary: 70 y/o female sent to ED from wound care center for worsening of L foot wound. There was concern for underlying osteomyelitis since per outpatient provider, wound could be probed to the bone. MRI foot did not show any osteomyelitis or abscess. ABI were 0.75, but likely falsely elevated. Since here wound was not healing despite wound care and antibiotics, she as seen by vascular and underwent aortogram with bilateral lower extremity runoff, s/p balloon angioplasty left tibioperoneal trunk and placement of a stent of left SFA. Patient bcame somnolent post procedure and  confused.  Initial CT of the head is negative for acute stroke.  MRI of the brain without contrast  negative for acute stroke. Patient seen and examined at bedside. she is agreeable to go to rehab facility.  Discharge Diagnoses:  Active Problems:   HLD (hyperlipidemia)   OBSTRUCTIVE SLEEP APNEA   Ocular myasthenia gravis (HCC)   Osteomyelitis (HCC)   Hypomagnesemia   Social problem   Open wound of plantar aspect of foot   DM2 (diabetes mellitus, type 2) (HCC)   Nonhealing left heel wound and cellulitis Improving On admission patient was found to have purulent discharge and foul-smelling from the left heel wound. She recently completed treatment with ciprofloxacin for Serratia isolated from the wound cultures. Repeat wound cultures show Providentia sensitive to rocephin.   Discontinue Rocephin today. MRI of the foot does not show any osteomyelitis or abscess Vascular surgery consulted and she underwent aortogram, balloon angioplasty and left SFA stent. Started her on aspirin, plavix and continue with statin. Currently waiting for SNF placement.       Acute encephalopathy of unclear etiology/ metabolic encephalopathy. Differentials include lethargy from moderate sedation vs  encephalopathy from elevated ammonia levels. EEG does not show any acute abnormality, TSH wnl, vitamin b12 levels are wnl. UA is slightly abnormal , but pt is already on IV cefepime transitioned to IV rocephin. . Initial CT head negative, follow up with MRI of the brain does not show any acute stroke. Neurology consulted, no further recommendations at this time. Patient appears alert and oriented, no new complaints at this time.   Type 2 diabetes mellitus A1c is 4.9       Lymphedema Worsening, with bilateral upper extremity edema.she has been diuresed well with IV lasix. Will continue another day of IV lasix and transition to oral lasix tomorrow.  Cxr does not show any pneumonia or pleural effusion or CHF.      Mild AKI With metabolic acidosis: Renal parameters are improving. Creatinine is 1.07, BUN wnl.      Hypokalemia: Replaced.     Obstructive sleep apnea Continue with CPAP     Ocular myasthenia gravis Patient on pyridostigmine   Hyperlipidemia Resume statin.     Deconditioning and generalized weakness: Therapy evaluations recommending SNF.     Upper extremity edema Venous duplex of the upper extremities is negative for DVT     Loose stools: D/ced lactulose.     Anemia of  blood loss superimposed on anemia of chronic disease Baseline hemoglobin appears to be around 9,  received a unit of PRBC transfusion on 02/20/2021. Repeat hemoglobin around 8 Anemia panel will be ordered for further evaluation.  Stool for occult blood will be ordered. Anemia panel  shows low folate levels, replaced.       Gout flare up of the right great toe. X rays show Moderate osteoarthritis of the first MTP joint.  No acute findings. Started her on colchicine with improvement in the pain.       Discharge Instructions  Discharge Instructions     Diet - low sodium heart healthy   Complete by: As directed    Discharge instructions   Complete by: As directed    Please follow up with vascular as recommended.   Discharge wound care:   Complete by: As directed    apply santyl to left plantar heel wound; pack with small 2X2 saline dampened gauze and cover with gauze and kerlix      Allergies as of 02/24/2021   No Known Allergies      Medication List     STOP taking these medications    BLOOD GLUCOSE TEST STRIPS Strp   ciprofloxacin 500 MG tablet Commonly known as: CIPRO   clotrimazole 1 % cream Commonly known as: LOTRIMIN   diphenhydrAMINE 25 MG tablet Commonly known as: BENADRYL   HYDROcodone-acetaminophen 7.5-325 MG tablet Commonly known as: Norco   nystatin powder Commonly known as: MYCOSTATIN/NYSTOP   predniSONE 10 MG tablet Commonly known as: DELTASONE       TAKE these medications    albuterol 108 (90 Base) MCG/ACT inhaler Commonly known as: VENTOLIN HFA INHALE 2 PUFFS INTO THE LUNGS EVERY 4 HOURS AS NEEDED FOR WHEEZING OR SHORTNESS OF BREATH   allopurinol 100 MG tablet Commonly known as: ZYLOPRIM TAKE 1 TABLET(100 MG) BY MOUTH DAILY What changed: See the new instructions.   aspirin EC 81 MG tablet Take 1 tablet (81 mg total) by mouth every evening.   atorvastatin 40 MG tablet Commonly known as: LIPITOR TAKE 1 TABLET(40 MG) BY MOUTH DAILY AT 6 PM What changed: See the new instructions.   busPIRone 5 MG tablet Commonly known as: BUSPAR TAKE 1 TABLET(5 MG) BY MOUTH TWICE DAILY FOR ANXIETY What changed: See the new instructions.   clopidogrel 75 MG tablet Commonly known as: PLAVIX Take 1 tablet (75 mg total) by  mouth daily.   colchicine 0.6 MG tablet Take 1 tablet (0.6 mg total) by mouth daily.   collagenase ointment Commonly known as: SANTYL Apply topically 2 (two) times daily.   feeding supplement Liqd Take 237 mLs by mouth 2 (two) times daily between meals.   feeding supplement Liqd Take 237 mLs by mouth 3 (three) times daily between meals.   folic acid 1 MG tablet Commonly known as: FOLVITE Take 1 tablet (1 mg total) by mouth daily.   furosemide 40 MG tablet Commonly known as: LASIX TAKE 1 TABLET(40 MG) BY MOUTH DAILY What changed: See the new instructions.   guaiFENesin-dextromethorphan 100-10 MG/5ML syrup Commonly known as: ROBITUSSIN DM Take 10 mLs by mouth every 4 (four) hours as needed for cough.   Magnesium 250 MG Tabs Take 1 tablet (250 mg total) by mouth daily. What changed:  when to take this additional instructions   metFORMIN 500 MG tablet Commonly known as: GLUCOPHAGE Take 1 tablet (500 mg total) by mouth 2 (two) times daily with a meal.   multivitamin with minerals  Tabs tablet Take 1 tablet by mouth daily.   omeprazole 40 MG capsule Commonly known as: PRILOSEC TAKE 1 CAPSULE(40 MG) BY MOUTH DAILY What changed: See the new instructions.   OXYGEN Inhale 1 L into the lungs at bedtime. IN ADDITION TO CPAP NIGHTLY   polyethylene glycol powder 17 GM/SCOOP powder Commonly known as: MiraLax Mix 1 scoop (17 grams) of powder in water daily to help with constipation. What changed:  how much to take how to take this when to take this reasons to take this additional instructions   potassium chloride 10 MEQ tablet Commonly known as: KLOR-CON TAKE 1 TABLET BY MOUTH DAILY WITH FLUID PILL What changed: See the new instructions.   pyridostigmine 60 MG tablet Commonly known as: MESTINON Take 1 tablet (60 mg total) by mouth 3 (three) times daily.   traZODone 100 MG tablet Commonly known as: DESYREL TAKE 1 TABLET BY MOUTH AT BEDTIME AS NEEDED FOR SLEEP What  changed:  reasons to take this additional instructions               Discharge Care Instructions  (From admission, onward)           Start     Ordered   02/24/21 0000  Discharge wound care:       Comments: apply santyl to left plantar heel wound; pack with small 2X2 saline dampened gauze and cover with gauze and kerlix   02/24/21 1044            Follow-up Information     Leslie Andrea, MD. Schedule an appointment as soon as possible for a visit in 1 week(s).   Specialty: Family Medicine Contact information: [place] [address] [phone]                No Known Allergies  Consultations: Vascular surgery.  Neurology.    Procedures/Studies: CT HEAD WO CONTRAST  Result Date: 02/15/2021 CLINICAL DATA:  Mental status change EXAM: CT HEAD WITHOUT CONTRAST TECHNIQUE: Contiguous axial images were obtained from the base of the skull through the vertex without intravenous contrast. COMPARISON:  MRI 02/24/2017 FINDINGS: Brain: No evidence of acute infarction, hemorrhage, hydrocephalus, extra-axial collection, visible mass lesion or mass effect. Symmetric prominence of the ventricles, cisterns and sulci compatible with parenchymal volume loss. Patchy areas of white matter hypoattenuation are most compatible with chronic microvascular angiopathy. Benign-appearing coarse dural calcifications. No conspicuous dural thickening. Partially empty appearance of the sella. Remaining midline intracranial structures are unremarkable with normally positioned cerebellar tonsils. Basal cisterns are patent. Vascular: Atherosclerotic calcification of the carotid siphons and left intradural vertebral artery. No hyperdense vessel. Skull: Hyperostotic changes of the skull, typically benign incidental and often senescent finding. No calvarial fracture or suspicious osseous lesion. No scalp swelling or hematoma. Sinuses/Orbits: Paranasal sinuses and mastoid air cells  are predominantly clear. Orbital structures are unremarkable aside from prior lens extractions. Other: Numerous chronically absent dentition and carious lesions with few visible periapical lucencies. IMPRESSION: No acute intracranial abnormality. Background of parenchymal volume loss, microvascular angiopathy and intracranial atherosclerosis. Partially empty appearance of the sella, nonspecific finding particularly in elderly patients. Numerous chronically absent dentition and multiple carious lesions with periapical lucency. Correlate with outpatient dental exam. Electronically Signed   By: Lovena Le M.D.   On: 02/15/2021 23:30   MR BRAIN WO CONTRAST  Result Date: 02/16/2021 CLINICAL DATA:  Mental status change, unknown cause EXAM: MRI HEAD WITHOUT CONTRAST TECHNIQUE: Multiplanar, multiecho pulse sequences of the brain and  surrounding structures were obtained without intravenous contrast. COMPARISON:  August 2018 FINDINGS: Brain: There is no acute infarction or intracranial hemorrhage. There is no intracranial mass, mass effect, or edema. There is no hydrocephalus or extra-axial fluid collection. A few small foci of T2 hyperintensity in the supratentorial white matter are nonspecific but may reflect minor chronic microvascular ischemic changes. Ventricles and sulci are within normal limits in size and configuration. Vascular: Major vessel flow voids at the skull base are preserved. Skull and upper cervical spine: Normal marrow signal is preserved. Sinuses/Orbits: Paranasal sinuses are aerated. Bilateral lens replacements. Other: Sella is unremarkable.  Mastoid air cells are clear. IMPRESSION: No evidence of recent infarction, hemorrhage, or mass Electronically Signed   By: Macy Mis M.D.   On: 02/16/2021 16:45   MR FOOT LEFT W WO CONTRAST  Result Date: 02/11/2021 CLINICAL DATA:  Foot swelling, diabetic, osteomyelitis suspected, xray done EXAM: MRI OF THE LEFT FOREFOOT WITHOUT AND WITH CONTRAST  TECHNIQUE: Multiplanar, multisequence MR imaging of the left forefoot was performed both before and after administration of intravenous contrast. CONTRAST:  27mL GADAVIST GADOBUTROL 1 MMOL/ML IV SOLN COMPARISON:  Left foot radiograph 01/28/2021 FINDINGS: Bones/Joint/Cartilage There is a serpiginous lesion in the calcaneus posteriorly with preserved internal fat and a rim of T2 hyperintensity. There is mild fifth MTP arthritic change. There is mild linear enhancement and low T1 signal along the plantar aspect of the calcaneus near the plantar fascia insertion, partially visualized in a single plane. There is a partially visualized possible soft tissue ulcer along the plantar heel obscured by wraparound artifact on axial T1 image 39. In the forefoot, there is no evidence of osteomyelitis. Ligaments Intact Lisfranc ligament. Muscles and Tendons There is mild muscle atrophy with intramuscular edema commonly seen in diabetes. There is no evidence of acute tendon tear. Soft tissues There is extensive soft tissue swelling. There is focal soft tissue inflammatory change with edema and enhancement along the soft tissues adjacent to the distal phalanx of the fifth toe. IMPRESSION: Extensive soft tissue swelling and focal soft tissue inflammatory change at the tip of the fifth toe. No evidence of osteomyelitis in the forefoot. Partially visualized hindfoot with likely bone infarct in the calcaneus. There is linear low T1 signal and enhancement along the plantar aspect of the calcaneus near the plantar fascia insertion and possible overlying soft tissue ulcer along the plantar heel, partially visualized only on axial images. Consider dedicated ankle MRI to further evaluate for hindfoot infection. Electronically Signed   By: Maurine Simmering   On: 02/11/2021 18:49   MR ANKLE LEFT W WO CONTRAST  Result Date: 02/12/2021 CLINICAL DATA:  Heel ulcer EXAM: MRI OF THE LEFT ANKLE WITHOUT AND WITH CONTRAST TECHNIQUE: Multiplanar,  multisequence MR imaging of the ankle was performed before and after the administration of intravenous contrast. CONTRAST:  73mL GADAVIST GADOBUTROL 1 MMOL/ML IV SOLN COMPARISON:  X-ray 01/28/2021, forefoot MRI 02/11/2021 FINDINGS: TENDONS Peroneal: Intact peroneus longus and peroneus brevis tendons. Posteromedial: Intact tibialis posterior, flexor hallucis longus and flexor digitorum longus tendons. Anterior: Intact tibialis anterior, extensor hallucis longus and extensor digitorum longus tendons. Achilles: Mild distal Achilles tendinosis without tear. Large ossified posterior calcaneal enthesophyte. Plantar Fascia: Minimal thickening of the central band of the plantar fascia without tear. LIGAMENTS Lateral: The anterior and posterior tibiofibular ligaments are intact. The anterior and posterior talofibular ligaments are intact. Intact calcaneofibular ligament. Medial: Deltoid ligament and spring ligament complex intact. CARTILAGE Ankle Joint: No joint effusion or chondral defect. Subtalar  Joints/Sinus Tarsi: No joint effusion or chondral defect. Preservation of the anatomic fat within the sinus tarsi. Bones: Prominent bone infarct within the posterior calcaneal body. No bony erosion. No marrow edema or periostitis. No acute fracture or malalignment. Talonavicular osteoarthritis with prominent dorsal spurring at the talus. No suspicious marrow replacing bone lesion. Other: Shallow soft tissue ulceration at the plantar aspect of the hindfoot (series 9, image 13). No associated sinus tract or fluid collection. Circumferential subcutaneous edema throughout the visualized lower leg, ankle, and foot. No evidence of abscess. IMPRESSION: IMPRESSION 1. Shallow soft tissue ulceration at the plantar aspect of the left hindfoot. No associated sinus tract, fluid collection, or osteomyelitis of the subjacent calcaneus. 2. Circumferential subcutaneous edema throughout the visualized lower leg, ankle, and foot. 3. Mild distal  Achilles tendinosis without tear. 4. Incidentally noted calcaneal bone infarction. Electronically Signed   By: Davina Poke D.O.   On: 02/12/2021 10:27   PERIPHERAL VASCULAR CATHETERIZATION  Result Date: 02/15/2021 Images from the original result were not included. Patient name: SACHA TOPOR MRN: [id number] DOB: [birth year] Sex: female 02/15/2021 Pre-operative Diagnosis: chronic limb threatening ischemia left lower extremity Post-operative diagnosis:  Same Surgeon:  Erlene Quan C. Donzetta Matters, MD Procedure Performed: 1.  Ultrasound-guided cannulation right common femoral artery 2.  Aortogram with bilateral lower extremity runoff 3.  Drug-coated balloon angioplasty left tibioperoneal trunk with 4 mm Ranger 4.  Stent of left SFA with 6 x 80 mm Elluvia 5.  Moderate sedation with fentanyl and Versed for 46 minutes 6.  Mynx device closure right common femoral artery Indications: 70 year old female admitted with ulceration of her left heel.  She is indicated for angiography with possible intervention. Findings: The aorta is patent as were the bilateral common iliac arteries.  There does appear to be mild flow-limiting stenosis of the left hypogastric artery.  The bilateral renal arteries are patent.  Right lower extremity was incompletely evaluated there appears to be at least 50% stenosis of the mid SFA.  Below the knee on the right peroneal artery appears to be dominant runoff.  The left lower extremity is the site of interest.  There is approximately at least 60% stenosis in the mid SFA after stenting with drug-eluting stent this is reduced to 0%.  At the tibioperoneal trunk there is heavy disease.  I could not traverse into the posterior tibial artery which is diminutive throughout its course appears to frankly occlude distally.  The peroneal artery is the dominant runoff.  I did balloon angioplasty of the tibioperoneal trunk where there was at least 50% stenosis is reduced to less than 20% and there is improved flow in  the peroneal artery.  The anterior tibial artery is also patent although there is proximal disease and the flow was disturbed.  Given the patient's noncompliance during the procedure it was difficult to obtain adequate pictures at completion she did have improved flow with a very strong peroneal signal at the ankle.  Procedure:  The patient was identified in the holding area and taken to room 8.  The patient was then placed supine on the table and prepped and draped in the usual sterile fashion.  A time out was called.  Ultrasound was used to evaluate the right common femoral artery.  This was somewhat diseased particularly posteriorly.  There is anesthetized 1% lidocaine cannulated micropuncture needle followed by wire and sheath.  The wire did traverse easily.  This was done under ultrasound guidance and images saved the permanent record.  A Bentson  wire was then placed and we exchanged for a 5 French sheath.  We then placed an Omni catheter up to the level of L1 performed aortogram followed bilateral extremity runoff.  We then crossed the bifurcation performed left lower extremity angiography.  With the above findings we plan to intervene.  We placed initially attempts at 6 French sheath was then placed a 7 [place] dilator.  We ultimately exchanged for a Rosen wire and placed a long 6 French sheath in the left SFA patient was fully heparinized.  We crossed first the SFA under fluoroscopic guidance with V 18 wire and quick cross catheter.  At the TP trunk bifurcation and posterior tibial artery we attempted to use V 18 and quick cross select catheter but could not direct into the posterior tibial artery.  Given the patient's noncompliance I elected not to cannulate the posterior tibial artery retrograde.  We then primarily ballooned dilated the tibioperoneal trunk with 3 mm balloon.  This was then dilated to 4 mm drug-coated balloon angioplasty at nominal pressure for 4 minutes.  Completion demonstrated no residual  dissection stenosis was less than 20%.  Satisfied with this we turned our attention towards the SFA.  This was primarily stented with a 6 mm drug-eluting stent and postdilated with 5 mm balloon.  Completion of demonstrated minimal residual stenosis.  We then exchanged for a short 6 French sheath perform retrograde angiography.  A minx device was deployed.  She tolerated procedure without any complication. Contrast: 150 cc Brandon C. Donzetta Matters, MD [facility] Office: [phone] Pager: [phone]   US ARTERIAL ABI (SCREENING LOWER EXTREMITY)  Result Date: 02/12/2021 CLINICAL DATA:  Left foot pain and swelling for 1 month. Left foot osteomyelitis. Nonhealing wound for 1 month. EXAM: NONINVASIVE PHYSIOLOGIC VASCULAR STUDY OF BILATERAL LOWER EXTREMITIES TECHNIQUE: Evaluation of both lower extremities were performed at rest, including calculation of ankle-brachial indices with single level Doppler, pressure and pulse volume recording. COMPARISON:  None. FINDINGS: Right ABI:  0.79 Left ABI:  0.75 Right Lower Extremity:  Normal arterial waveforms at the ankle. Left Lower Extremity: Monophasic waveform seen in the posterior tibial and dorsalis pedis arteries. 0.5-0.79 Moderate PAD IMPRESSION: ABI findings indicative of moderate bilateral lower extremity arterial occlusive disease. Electronically Signed   By: Miachel Roux M.D.   On: 02/12/2021 09:38   DG CHEST PORT 1 VIEW  Result Date: 02/20/2021 CLINICAL DATA:  Coughing up phlegm. EXAM: PORTABLE CHEST 1 VIEW COMPARISON:  February 18, 2021 FINDINGS: A left PICC line terminates in the SVC. Stable cardiomegaly. The hila and mediastinum are unchanged. No pneumothorax. No nodules or masses. Mild atelectasis in the bases. No suspicious infiltrates. IMPRESSION: No active disease. Electronically Signed   By: Dorise Bullion III M.D   On: 02/20/2021 18:29   DG CHEST PORT 1 VIEW  Result Date: 02/18/2021 CLINICAL DATA:  Chest pain. History of  asthma. EXAM: PORTABLE CHEST 1 VIEW COMPARISON:  Chest x-ray and chest CT 02/15/2021 FINDINGS: The cardiac silhouette, mediastinal and hilar contours are stable. Stable marked eventration of the left hemidiaphragm with overlying vascular crowding and atelectasis and small left effusion. Streaky right basilar atelectasis. IMPRESSION: Persistent small left pleural effusion and overlying atelectasis. Electronically Signed   By: Marijo Sanes M.D.   On: 02/18/2021 09:27   DG CHEST PORT 1 VIEW  Result Date: 02/15/2021 CLINICAL DATA:  Preoperative EXAM: PORTABLE CHEST 1 VIEW COMPARISON:  11/21/2020 Cardiomegaly. Unchanged elevation of the left hemidiaphragm. No acute appearing airspace opacity.  The visualized skeletal structures are unremarkable. IMPRESSION: No acute abnormality of the lungs in frontal projection. Electronically Signed   By: Eddie Candle M.D.   On: 02/15/2021 15:47   DG Foot Complete Left  Result Date: 01/29/2021 CLINICAL DATA:  Chronic skin ulcer.  Ulcer on heel foot.  Foot pain. EXAM: LEFT FOOT - COMPLETE 3+ VIEW COMPARISON:  None. FINDINGS: There is minimal skin irregularity overlying the posterior heel. No tracking soft tissue air. No radiopaque foreign body. No evidence of osteomyelitis. No erosion, bone destruction or abnormal density. There is a large Achilles tendon enthesophyte and small plantar calcaneal spur. Mild midfoot degenerative change with spurring. Hammertoe deformity of the digits. Generalized soft tissue edema about the foot and ankle. IMPRESSION: 1. Soft tissue edema about the foot and ankle. No radiographic evidence of osteomyelitis. 2. Large Achilles tendon enthesophyte and small plantar calcaneal spur. 3. Mild midfoot degenerative change. Electronically Signed   By: Keith Rake M.D.   On: 01/29/2021 12:21   DG Toe Great Right  Result Date: 02/23/2021 CLINICAL DATA:  Sudden onset right great toe pain.  No known injury. EXAM: RIGHT GREAT TOE COMPARISON:  07/11/2020  FINDINGS: No acute fracture or dislocation. Moderate osteoarthritis of the first MTP joint. No erosion or periosteal elevation. No focal soft tissue abnormality. IMPRESSION: Moderate osteoarthritis of the first MTP joint.  No acute findings. Electronically Signed   By: Davina Poke D.O.   On: 02/23/2021 13:17   EEG adult  Result Date: 02/17/2021 Lora Havens, MD     02/17/2021  9:23 AM Patient Name: ALEXIS REBER MRN: [id number] Epilepsy Attending: Lora Havens Referring Physician/Provider: Dr Hosie Poisson Date: 02/16/2021 Duration: 25.30 mins Patient history: 70 year old female with altered mental status.  EEG to evaluate for seizures. Level of alertness: Awake AEDs during EEG study: None Technical aspects: This EEG study was done with scalp electrodes positioned according to the 10-20 International system of electrode placement. Electrical activity was acquired at a sampling rate of 500Hz  and reviewed with a high frequency filter of 70Hz  and a low frequency filter of 1Hz . EEG data were recorded continuously and digitally stored. Description: No posterior dominant rhythm was seen.  EEG showed continuous generalized 3 to 5 Hz theta-delta slowing. Generalized periodic discharges with triphasic morphology at 1.5 to 2 Hz were also noted.  Hyperventilation and photic stimulation were not performed.   ABNORMALITY - Periodic discharges with triphasic morphology, generalized ( GPDs) - Continuous slow, generalized IMPRESSION: This study showed generalized periodic discharges with triphasic morphology at 1.5 to 2 Hz which is on the ictal-interictal continuum.  However given the morphology and frequency, is more likely due to underlying toxic-metabolic causes.  Additionally there is evidence of moderate diffuse encephalopathy, nonspecific etiology but likely due to toxic metabolic causes.  No seizures were seen throughout the recording. Priyanka O Yadav   VAS [place] LOWER EXTREMITY VENOUS (DVT)  Result Date:  02/15/2021  Lower Venous DVT Study Patient Name:  CHINA DEITRICK  Date of Exam:   02/15/2021 Medical Rec #: [id number]          Accession #:    [id number] Date of Birth: 09-16-1950          Patient Gender: F Patient Age:   71Y Exam Location:  [facility] Procedure:      VAS [place] LOWER EXTREMITY VENOUS (DVT) Referring Phys: [id number] JEHANZEB MEMON --------------------------------------------------------------------------------  Indications: Swelling.  Comparison Study: No prior. Performing Technologist: Oda Cogan RDMS, RVT  Examination Guidelines: A complete evaluation includes B-mode imaging, spectral Doppler, color Doppler, and power Doppler as needed of all accessible portions of each vessel. Bilateral testing is considered an integral part of a complete examination. Limited examinations for reoccurring indications may be performed as noted. The reflux portion of the exam is performed with the patient in reverse Trendelenburg.  +---------+---------------+---------+-----------+----------+--------------+ RIGHT    CompressibilityPhasicitySpontaneityPropertiesThrombus Aging +---------+---------------+---------+-----------+----------+--------------+ CFV      Full           Yes      Yes                                 +---------+---------------+---------+-----------+----------+--------------+ SFJ      Full                                                        +---------+---------------+---------+-----------+----------+--------------+ FV Prox  Full                                                        +---------+---------------+---------+-----------+----------+--------------+ FV Mid   Full                                                        +---------+---------------+---------+-----------+----------+--------------+ FV DistalFull                                                        +---------+---------------+---------+-----------+----------+--------------+ PFV       Full                                                        +---------+---------------+---------+-----------+----------+--------------+ POP      Full           Yes      Yes                                 +---------+---------------+---------+-----------+----------+--------------+ PTV      Full                                                        +---------+---------------+---------+-----------+----------+--------------+ PERO     Full                                                        +---------+---------------+---------+-----------+----------+--------------+   +---------+---------------+---------+-----------+----------+--------------+  LEFT     CompressibilityPhasicitySpontaneityPropertiesThrombus Aging +---------+---------------+---------+-----------+----------+--------------+ CFV      Full           Yes      Yes                                 +---------+---------------+---------+-----------+----------+--------------+ SFJ      Full                                                        +---------+---------------+---------+-----------+----------+--------------+ FV Prox  Full                                                        +---------+---------------+---------+-----------+----------+--------------+ FV Mid   Full                                                        +---------+---------------+---------+-----------+----------+--------------+ FV DistalFull                                                        +---------+---------------+---------+-----------+----------+--------------+ PFV      Full                                                        +---------+---------------+---------+-----------+----------+--------------+ POP      Full           Yes      Yes                                 +---------+---------------+---------+-----------+----------+--------------+ PTV      Full                                                         +---------+---------------+---------+-----------+----------+--------------+ PERO     Full                                                        +---------+---------------+---------+-----------+----------+--------------+     Summary: BILATERAL: - No evidence of deep vein thrombosis seen in the lower extremities, bilaterally. -No evidence of popliteal cyst, bilaterally.   *See table(s) above for measurements and observations.    Preliminary    VAS [place] UPPER EXTREMITY VENOUS DUPLEX  Result Date: 02/20/2021 UPPER VENOUS STUDY  Patient Name:  KODIE PICK  Date of Exam:   02/20/2021 Medical Rec #: [id number]          Accession #:    [id number] Date of Birth: [birth year]          Patient Gender: F Patient Age:   69Y Exam Location:  [facility] Procedure:      VAS [place] UPPER EXTREMITY VENOUS DUPLEX Referring Phys: [id number] Clemente Dewey --------------------------------------------------------------------------------  Indications: Edema Limitations: Body habitus, patient somnolence and inability to cooperate, bandages and line. Comparison Study: No prior study Performing Technologist: Sharion Dove RVS  Examination Guidelines: A complete evaluation includes B-mode imaging, spectral Doppler, color Doppler, and power Doppler as needed of all accessible portions of each vessel. Bilateral testing is considered an integral part of a complete examination. Limited examinations for reoccurring indications may be performed as noted.  Right Findings: +----------+------------+---------+-----------+----------+-------+ RIGHT     CompressiblePhasicitySpontaneousPropertiesSummary +----------+------------+---------+-----------+----------+-------+ IJV           Full       Yes       Yes                      +----------+------------+---------+-----------+----------+-------+ Subclavian               Yes       Yes                       +----------+------------+---------+-----------+----------+-------+ Axillary                 Yes       Yes                      +----------+------------+---------+-----------+----------+-------+ Brachial      Full       Yes       Yes                      +----------+------------+---------+-----------+----------+-------+ Radial                   Yes       Yes                      +----------+------------+---------+-----------+----------+-------+ Ulnar                    Yes       Yes                      +----------+------------+---------+-----------+----------+-------+ Cephalic      Full                                          +----------+------------+---------+-----------+----------+-------+ Basilic       Full                                          +----------+------------+---------+-----------+----------+-------+  Left Findings: +----------+------------+---------+-----------+----------+--------------+ LEFT      CompressiblePhasicitySpontaneousProperties   Summary     +----------+------------+---------+-----------+----------+--------------+ IJV                      Yes       Yes                             +----------+------------+---------+-----------+----------+--------------+  Subclavian               Yes       Yes                             +----------+------------+---------+-----------+----------+--------------+ Axillary                 Yes       Yes                             +----------+------------+---------+-----------+----------+--------------+ Brachial      Full       Yes       Yes              Not visualized +----------+------------+---------+-----------+----------+--------------+ Radial        Full                                                 +----------+------------+---------+-----------+----------+--------------+ Cephalic      Full                                                  +----------+------------+---------+-----------+----------+--------------+ Basilic       Full                                                 +----------+------------+---------+-----------+----------+--------------+  Summary:  Right: No evidence of deep vein thrombosis in the upper extremity. No evidence of superficial vein thrombosis in the upper extremity.  Left: No evidence of deep vein thrombosis in the visualized veins of the upper extremity. No evidence of superficial vein thrombosis in the upper extremity.  *See table(s) above for measurements and observations.  Diagnosing physician: Deitra Mayo MD Electronically signed by Deitra Mayo MD on 02/20/2021 at 6:45:16 PM.    Final    [place] EKG SITE RITE  Result Date: 02/15/2021 If Site Rite image not attached, placement could not be confirmed due to current cardiac rhythm.  [place] EKG SITE RITE  Result Date: 02/15/2021 If Site Rite image not attached, placement could not be confirmed due to current cardiac rhythm.  CT CHEST ABDOMEN PELVIS WO CONTRAST  Result Date: 02/15/2021 CLINICAL DATA:  70 year old female with altered mental status and sepsis. EXAM: CT CHEST, ABDOMEN AND PELVIS WITHOUT CONTRAST TECHNIQUE: Multidetector CT imaging of the chest, abdomen and pelvis was performed following the standard protocol without IV contrast. COMPARISON:  Chest CT dated 01/14/2021. FINDINGS: Evaluation of this exam is limited in the absence of intravenous contrast. Evaluation is also limited due to streak artifact caused by patient's arms. CT CHEST FINDINGS Cardiovascular: There is no cardiomegaly or pericardial effusion. Mild atherosclerotic calcification of the thoracic aorta. No aneurysmal dilatation. The central pulmonary arteries are grossly unremarkable. Mediastinum/Nodes: No hilar or mediastinal adenopathy. The esophagus and thyroid gland are grossly unremarkable. No mediastinal fluid collection. Lungs/Pleura: Small bilateral pleural  effusions. There is minimal compressive atelectasis of the lower lobes. Pneumonia is not excluded. Clinical correlation is recommended. No pneumothorax. The central airways  are patent. Musculoskeletal: Osteopenia with degenerative changes of the spine. No acute osseous pathology. CT ABDOMEN PELVIS FINDINGS No intra-abdominal free air or free fluid. Hepatobiliary: Probable fatty liver. No intrahepatic biliary dilatation. Gallstones. No pericholecystic fluid. Pancreas: There is stranding and inflammatory changes surrounding the pancreas most consistent with acute pancreatitis. Correlation with pancreatic enzymes recommended. No drainable fluid collection/abscess or pseudocyst. Spleen: Normal in size without focal abnormality. Adrenals/Urinary Tract: The adrenal glands unremarkable. There is no hydronephrosis on either side. There is symmetric enhancement and excretion of contrast by both kidneys. Several subcentimeter right renal hypodense lesions are too small to characterize. The visualized ureters appear unremarkable. The urinary bladder is partially distended and contains excreted contrast from recent IV administration. Stomach/Bowel: There is no bowel obstruction or active inflammation. Loose stool noted throughout the colon. The appendix is normal. Vascular/Lymphatic: Advanced aortoiliac atherosclerotic disease. The IVC is unremarkable. No portal venous gas. There is no adenopathy. Reproductive: There is a 3 cm posterior uterine body calcified fibroid. No adnexal masses. Other: There is a 3 x 3 cm hematoma in the soft tissues of the right groin. Overlying compression is noted. Musculoskeletal: Osteopenia with degenerative changes of the spine. No acute osseous pathology. IMPRESSION: 1. Acute pancreatitis. No drainable fluid collection/abscess or pseudocyst. 2. A 3 cm hematoma in the right groin. 3. Cholelithiasis. 4. Small bilateral pleural effusions with minimal compressive atelectasis of the lower lobes.  Pneumonia is not excluded. Clinical correlation is recommended. 5. Aortic Atherosclerosis (ICD10-I70.0). These results were called by telephone at the time of interpretation on 02/15/2021 at 11:31 pm to the patient's nurse, Netta Corrigan, who verbally acknowledged these results. Electronically Signed   By: Anner Crete M.D.   On: 02/15/2021 23:34      Subjective:  Agreeable to going to SNF.  Discharge Exam: Vitals:   02/24/21 0335 02/24/21 0845  BP: (!) 130/46   Pulse: 84   Resp: 20 16  Temp: 98.2 F (36.8 C) (!) 97.5 F (36.4 C)  SpO2: 96%    Vitals:   02/23/21 1940 02/23/21 2310 02/24/21 0335 02/24/21 0845  BP: (!) 143/54 (!) 118/44 (!) 130/46   Pulse: 89 80 84   Resp: 18 16 20 16   Temp: 98.3 F (36.8 C) 97.9 F (36.6 C) 98.2 F (36.8 C) (!) 97.5 F (36.4 C)  TempSrc: Oral Oral Oral Oral  SpO2: 95% 98% 96%   Weight:      Height:        General: Pt is alert, awake, not in acute distress Cardiovascular: RRR, S1/S2 +, no rubs, no gallops Respiratory: CTA bilaterally, no wheezing, no rhonchi Abdominal: Soft, NT, ND, bowel sounds + Extremities: improving pedal edema.  no cyanosis    The results of significant diagnostics from this hospitalization (including imaging, microbiology, ancillary and laboratory) are listed below for reference.     Microbiology: Recent Results (from the past 240 hour(s))  Surgical pcr screen     Status: None   Collection Time: 02/14/21 10:06 PM   Specimen: Nasal Mucosa; Nasal Swab  Result Value Ref Range Status   MRSA, PCR NEGATIVE NEGATIVE Final   Staphylococcus aureus NEGATIVE NEGATIVE Final    Comment: (NOTE) The Xpert SA Assay (FDA approved for NASAL specimens in patients 72 years of age and older), is one component of a comprehensive surveillance program. It is not intended to diagnose infection nor to guide or monitor treatment. Performed at [facility], [place] [address], [place], [place] [address]   Urine Culture  Status:  Abnormal   Collection Time: 02/15/21  2:04 AM   Specimen: Urine, Catheterized  Result Value Ref Range Status   Specimen Description URINE, CATHETERIZED  Final   Special Requests   Final    NONE Performed at [facility], [address] [place], [place] [address]    Culture 30,000 COLONIES/mL YEAST (A)  Final   Report Status 02/17/2021 FINAL  Final  Culture, blood (routine x 2)     Status: None   Collection Time: 02/15/21 12:49 PM   Specimen: BLOOD  Result Value Ref Range Status   Specimen Description BLOOD RIGHT ANTECUBITAL  Final   Special Requests   Final    BOTTLES DRAWN AEROBIC ONLY Blood Culture adequate volume   Culture   Final    NO GROWTH 5 DAYS Performed at [facility], [place] [address], [place], [place] [address]    Report Status 02/20/2021 FINAL  Final  Culture, blood (routine x 2)     Status: None   Collection Time: 02/15/21 12:58 PM   Specimen: BLOOD LEFT HAND  Result Value Ref Range Status   Specimen Description BLOOD LEFT HAND  Final   Special Requests   Final    BOTTLES DRAWN AEROBIC ONLY Blood Culture results may not be optimal due to an inadequate volume of blood received in culture bottles   Culture   Final    NO GROWTH 5 DAYS Performed at [facility], [place] [address], [place], [place] [address]    Report Status 02/20/2021 FINAL  Final  SARS CORONAVIRUS 2 (TAT 6-24 HRS) Nasopharyngeal Nasopharyngeal Swab     Status: None   Collection Time: 02/22/21 12:44 PM   Specimen: Nasopharyngeal Swab  Result Value Ref Range Status   SARS Coronavirus 2 NEGATIVE NEGATIVE Final    Comment: (NOTE) SARS-CoV-2 target nucleic acids are NOT DETECTED.  The SARS-CoV-2 RNA is generally detectable in upper and lower respiratory specimens during the acute phase of infection. Negative results do not preclude SARS-CoV-2 infection, do not rule out co-infections with other pathogens, and should not be used as the sole basis for treatment or other patient  management decisions. Negative results must be combined with clinical observations, patient history, and epidemiological information. The expected result is Negative.  Fact Sheet for Patients: SugarRoll.be  Fact Sheet for Healthcare Providers: https://www.woods-mathews.com/  This test is not yet approved or cleared by the [place] FDA and  has been authorized for detection and/or diagnosis of SARS-CoV-2 by FDA under an Emergency Use Authorization (EUA). This EUA will remain  in effect (meaning this test can be used) for the duration of the COVID-19 declaration under Se ction 564(b)(1) of the Act, 21 U.S.C. section 360bbb-3(b)(1), unless the authorization is terminated or revoked sooner.  Performed at [facility], [place] [address] [place], [place] [address]      Labs: BNP (last 3 results) Recent Labs    09/23/20 1135 11/08/20 1822 11/21/20 1421  BNP 36.0 19.0 06.3   Basic Metabolic Panel: Recent Labs  Lab 02/20/21 1043 02/21/21 1142 02/22/21 0410 02/23/21 0600 02/24/21 0530  NA 144 143 141 141 141  K 4.4 5.3* 5.1 4.4 4.2  CL 118* 118* 115* 111 110  CO2 20* 22 22 23 26   GLUCOSE 86 99 80 69* 72  BUN 21 19 18 14 13   CREATININE 1.27* 1.23* 1.16* 1.07* 0.97  CALCIUM 8.1* 8.3* 8.2* 8.2* 8.1*   Liver Function Tests: No results  for input(s): AST, ALT, ALKPHOS, BILITOT, PROT, ALBUMIN in the last 168 hours. No results for input(s): LIPASE, AMYLASE in the last 168 hours. Recent Labs  Lab 02/17/21 1518  AMMONIA 28   CBC: Recent Labs  Lab 02/18/21 0436 02/20/21 1043 02/21/21 1142  WBC 15.6* 13.1* 11.4*  NEUTROABS  --  8.8* 7.7  HGB 7.2* 7.2* 8.0*  HCT 21.8* 21.9* 24.3*  MCV 94.4 97.3 95.7  PLT 234 251 249   Cardiac Enzymes: No results for input(s): CKTOTAL, CKMB, CKMBINDEX, TROPONINI in the last 168 hours. BNP: Invalid input(s): POCBNP CBG: Recent Labs  Lab 02/23/21 0721 02/23/21 1126 02/23/21 1643  02/23/21 2206 02/24/21 0626  GLUCAP 86 87 129* 103* 74   D-Dimer No results for input(s): DDIMER in the last 72 hours. Hgb A1c No results for input(s): HGBA1C in the last 72 hours. Lipid Profile No results for input(s): CHOL, HDL, LDLCALC, TRIG, CHOLHDL, LDLDIRECT in the last 72 hours. Thyroid function studies No results for input(s): TSH, T4TOTAL, T3FREE, THYROIDAB in the last 72 hours.  Invalid input(s): FREET3 Anemia work up Recent Labs    02/21/21 1142 02/21/21 1400  VITAMINB12  --  505  FOLATE  --  4.0*  FERRITIN  --  425*  TIBC  --  99*  IRON  --  28  RETICCTPCT 2.6  --    Urinalysis    Component Value Date/Time   COLORURINE YELLOW 02/16/2021 0136   APPEARANCEUR HAZY (A) 02/16/2021 0136   LABSPEC 1.043 (H) 02/16/2021 0136   PHURINE 5.0 02/16/2021 0136   GLUCOSEU NEGATIVE 02/16/2021 0136   HGBUR MODERATE (A) 02/16/2021 0136   BILIRUBINUR NEGATIVE 02/16/2021 0136   KETONESUR NEGATIVE 02/16/2021 0136   PROTEINUR NEGATIVE 02/16/2021 0136   NITRITE NEGATIVE 02/16/2021 0136   LEUKOCYTESUR SMALL (A) 02/16/2021 0136   Sepsis Labs Invalid input(s): PROCALCITONIN,  WBC,  LACTICIDVEN Microbiology Recent Results (from the past 240 hour(s))  Surgical pcr screen     Status: None   Collection Time: 02/14/21 10:06 PM   Specimen: Nasal Mucosa; Nasal Swab  Result Value Ref Range Status   MRSA, PCR NEGATIVE NEGATIVE Final   Staphylococcus aureus NEGATIVE NEGATIVE Final    Comment: (NOTE) The Xpert SA Assay (FDA approved for NASAL specimens in patients 39 years of age and older), is one component of a comprehensive surveillance program. It is not intended to diagnose infection nor to guide or monitor treatment. Performed at [facility], [place] [address], [place], [place] [address]   Urine Culture     Status: Abnormal   Collection Time: 02/15/21  2:04 AM   Specimen: Urine, Catheterized  Result Value Ref Range Status   Specimen Description URINE, CATHETERIZED   Final   Special Requests   Final    NONE Performed at [facility], [place] [address] [place], [place] [address]    Culture 30,000 COLONIES/mL YEAST (A)  Final   Report Status 02/17/2021 FINAL  Final  Culture, blood (routine x 2)     Status: None   Collection Time: 02/15/21 12:49 PM   Specimen: BLOOD  Result Value Ref Range Status   Specimen Description BLOOD RIGHT ANTECUBITAL  Final   Special Requests   Final    BOTTLES DRAWN AEROBIC ONLY Blood Culture adequate volume   Culture   Final    NO GROWTH 5 DAYS Performed at [facility], [place] [address] [place], [place] [address]    Report Status 02/20/2021 FINAL  Final  Culture, blood (routine x 2)     Status: None   Collection Time: 02/15/21 12:58 PM   Specimen: BLOOD LEFT HAND  Result Value Ref Range Status   Specimen Description BLOOD LEFT HAND  Final   Special Requests   Final    BOTTLES DRAWN AEROBIC ONLY Blood Culture results may not be optimal due to an inadequate volume of blood received in culture bottles   Culture   Final    NO GROWTH 5 DAYS Performed at [facility], [place] [address], [place], [place] [address]    Report Status 02/20/2021 FINAL  Final  SARS CORONAVIRUS 2 (TAT 6-24 HRS) Nasopharyngeal Nasopharyngeal Swab     Status: None   Collection Time: 02/22/21 12:44 PM   Specimen: Nasopharyngeal Swab  Result Value Ref Range Status   SARS Coronavirus 2 NEGATIVE NEGATIVE Final    Comment: (NOTE) SARS-CoV-2 target nucleic acids are NOT DETECTED.  The SARS-CoV-2 RNA is generally detectable in upper and lower respiratory specimens during the acute phase of infection. Negative results do not preclude SARS-CoV-2 infection, do not rule out co-infections with other pathogens, and should not be used as the sole basis for treatment or other patient management decisions. Negative results must be combined with clinical observations, patient history, and epidemiological information. The expected result is  Negative.  Fact Sheet for Patients: SugarRoll.be  Fact Sheet for Healthcare Providers: https://www.woods-mathews.com/  This test is not yet approved or cleared by the [place] FDA and  has been authorized for detection and/or diagnosis of SARS-CoV-2 by FDA under an Emergency Use Authorization (EUA). This EUA will remain  in effect (meaning this test can be used) for the duration of the COVID-19 declaration under Se ction 564(b)(1) of the Act, 21 U.S.C. section 360bbb-3(b)(1), unless the authorization is terminated or revoked sooner.  Performed at [facility], [place] [address] [place], [place] [address]      Time coordinating discharge: 38 minutes.   SIGNED:   Hosie Poisson, MD  [facility]

## 2021-02-24 NOTE — TOC Progression Note (Signed)
Transition of Care [facility]) - Progression Note    Patient Details  Name: BREXLEY CUTSHAW MRN: [id number] Date of Birth: 1951-07-01  Transition of Care [facility] CM/SW [place],  Phone Number: 02/24/2021, 12:52 PM  Clinical Narrative:     Per [place]- they are trying to secure cpap for patient. Waiting on confirmation   Thurmond Butts, MSW, LCSW Clinical Social Worker    Expected Discharge Plan: Skilled Nursing Facility Barriers to Discharge: Barriers Resolved  Expected Discharge Plan and Services Expected Discharge Plan: [place] In-house Referral: Clinical Social Work Discharge Planning Services: CM Consult   Living arrangements for the past 2 months: Single Family Home Expected Discharge Date: 02/24/21                                     Social Determinants of Health (SDOH) Interventions    Readmission Risk Interventions Readmission Risk Prevention Plan 02/12/2021 10/20/2020 10/04/2020  Transportation Screening Complete Complete Complete  PCP or Specialist Appt within 5-7 Days - - Complete  Home Care Screening - - Complete  Medication Review (RN CM) - - Complete  HRI or [place] - Complete -  Social Work Consult for [place] Planning/Counseling - Complete -  Palliative Care Screening - Not Applicable -  Medication Review Press photographer) Complete Complete -  [place] or Home Care Consult Complete - -  SW Recovery Care/Counseling Consult Complete - -  Palliative Care Screening Not Applicable - -  [place] Not Applicable - -  Some recent data might be hidden

## 2021-02-24 NOTE — Progress Notes (Signed)
Occupational Therapy Treatment Patient Details Name: [name] MRN: [id number] DOB: Jan 10, 1951 Today's Date: 02/24/2021    History of present illness 70 yo admitted 02/11/21 to [facility] with left heel ulcer and ischemia. Transfer to [facility] 7/22. On 7/25 pt underwent angiography with SFA stent and Left tibioperoneal angioplasty. Pt with AMS/lethargy post procedure with head CT negative. PMhx: GERD, anxiety/depression, HTN, HLD, OSA, gout, lymphedema (self-reported), asthma.   OT comments  Pt received in bed, agreeable for OOB attempts with OT. Pt continues to require moderate assist to power up into [place] with encouragement to maximize initiation for tasks. Pt noted with bowel incontinence while standing in [place], requiring Total A for cleanup. Pt able to tolerate 3 min of upright standing in [place] prior to transfer to chair for breakfast. Decreased B hand swelling noted and pt able to feed self with regular utensils. Continue to recommend SNF rehab at [place].    Follow Up Recommendations  SNF;Supervision/Assistance - 24 hour    Equipment Recommendations  Hospital bed    Recommendations for Other Services      Precautions / Restrictions Precautions Precautions: Fall Precaution Comments: bil UE edema, L heel wrapped Restrictions Weight Bearing Restrictions: No       Mobility Bed Mobility Overal bed mobility: Needs Assistance Bed Mobility: Supine to Sit     Supine to sit: Mod assist;HOB elevated     General bed mobility comments: Mod A for advancing LEs and hips to EOB    Transfers Overall transfer level: Needs assistance Equipment used: Ambulation equipment used Transfers: Sit to/from Stand Sit to Stand: Mod assist;From elevated surface         General transfer comment: On 3rd attempt, pt able to power up into [place]. Noted lack of momentum or muscle activation on initial trials. Pt transferred to chair with [place]    Balance Overall balance assessment: Needs  assistance Sitting-balance support: Feet supported;Bilateral upper extremity supported Sitting balance-Leahy Scale: Fair     Standing balance support: Bilateral upper extremity supported Standing balance-Leahy Scale: Poor                             ADL either performed or assessed with clinical judgement   ADL Overall ADL's : Needs assistance/impaired Eating/Feeding: Set up;Sitting Eating/Feeding Details (indicate cue type and reason): setup sitting in recliner, assist to open containers. able to use regular utensils. B hand swelling much improved Grooming: Set up;Sitting;Wash/dry face Grooming Details (indicate cue type and reason): sitting EOB         Upper Body Dressing : Minimal assistance;Sitting Upper Body Dressing Details (indicate cue type and reason): Min A to don gown around back         Toileting- Clothing Manipulation and Hygiene: Total assistance;Sit to/from stand Toileting - Clothing Manipulation Details (indicate cue type and reason): Total A for hygiene standing in [place] after bowel incontinence noted       General ADL Comments: Pt with improving physical abilities for standing tolerance in [place] though self limiting at times     Vision   Vision Assessment?: No apparent visual deficits   Perception     Praxis      Cognition Arousal/Alertness: Awake/alert Behavior During Therapy: Flat affect Overall Cognitive Status: No family/caregiver present to determine baseline cognitive functioning Area of Impairment: Orientation;Attention;Following commands;Awareness;Safety/judgement;Problem solving;Memory                 Orientation Level: Disoriented to;Situation;Time Current Attention  Level: Selective Memory: Decreased short-term memory Following Commands: Follows one step commands with increased time Safety/Judgement: Decreased awareness of safety;Decreased awareness of deficits Awareness: Emergent Problem Solving: Slow  processing;Difficulty sequencing;Requires verbal cues;Requires tactile cues General Comments: more talkative with appropriate answers, delayed processing at times. decreased initiation of tasks (self limiting vs motor planning?)        Exercises     Shoulder Instructions       General Comments VSS on RA    Pertinent Vitals/ Pain       Pain Assessment: No/denies pain Pain Intervention(s): Monitored during session  Home Living                                          Prior Functioning/Environment              Frequency  Min 2X/week        Progress Toward Goals  OT Goals(current goals can now be found in the care plan section)  Progress towards OT goals: Progressing toward goals  Acute Rehab OT Goals Patient Stated Goal: to get stronger OT Goal Formulation: Patient unable to participate in goal setting Time For Goal Achievement: 03/02/21 Potential to Achieve Goals: Fair ADL Goals Pt Will Perform Eating: with modified independence;with adaptive utensils;sitting Pt Will Perform Grooming: with min assist;sitting Pt Will Perform Upper Body Bathing: with mod assist;sitting Pt Will Perform Lower Body Bathing: with max assist;sit to/from stand;sitting/lateral leans Additional ADL Goal #1: Pt to demo bed mobility with Min A in prep for ADL transfers  Plan Discharge plan remains appropriate;Frequency remains appropriate    Co-evaluation                 AM-PAC OT "6 Clicks" Daily Activity     Outcome Measure   Help from another person eating meals?: A Little Help from another person taking care of personal grooming?: A Little Help from another person toileting, which includes using toliet, bedpan, or urinal?: Total Help from another person bathing (including washing, rinsing, drying)?: Total Help from another person to put on and taking off regular upper body clothing?: A Little Help from another person to put on and taking off regular lower  body clothing?: Total 6 Click Score: 12    End of Session Equipment Utilized During Treatment: Gait belt;Other (comment) Charlaine Dalton)  OT Visit Diagnosis: Unsteadiness on feet (R26.81);Other abnormalities of gait and mobility (R26.89);Muscle weakness (generalized) (M62.81);Other symptoms and signs involving cognitive function   Activity Tolerance Patient tolerated treatment well   Patient Left in chair;with call bell/phone within reach;with chair alarm set   Nurse Communication Mobility status        Time: 9024-0973 OT Time Calculation (min): 29 min  Charges: OT General Charges $OT Visit: 1 Visit OT Treatments $Self Care/Home Management : 8-22 mins $Therapeutic Activity: 8-22 mins  Malachy Chamber, OTR/L Acute Rehab Services Office: [phone]    Layla Maw 02/24/2021, 8:46 AM

## 2021-03-09 ENCOUNTER — Other Ambulatory Visit: Payer: Self-pay | Admitting: Internal Medicine

## 2021-03-09 ENCOUNTER — Other Ambulatory Visit (HOSPITAL_COMMUNITY): Payer: Self-pay | Admitting: Internal Medicine

## 2021-03-09 DIAGNOSIS — M869 Osteomyelitis, unspecified: Secondary | ICD-10-CM

## 2021-03-22 ENCOUNTER — Ambulatory Visit (HOSPITAL_COMMUNITY): Payer: Medicare Other | Attending: Internal Medicine

## 2021-03-22 ENCOUNTER — Encounter (HOSPITAL_COMMUNITY): Payer: Self-pay

## 2021-05-12 ENCOUNTER — Telehealth: Payer: Self-pay | Admitting: Diagnostic Neuroimaging

## 2021-05-12 ENCOUNTER — Other Ambulatory Visit: Payer: Self-pay | Admitting: Diagnostic Neuroimaging

## 2021-05-12 NOTE — Telephone Encounter (Signed)
Attempted to reach patient , no answer, voice MB not set up. If she calls back phone staff may tell her she has refills at [facility] on [facility]. Dr Leta Baptist refilled it on 12/01/20 for on year.

## 2021-05-12 NOTE — Telephone Encounter (Signed)
Pt called requesting refill for pyridostigmine (MESTINON) 60 MG tablet. Pharmacy [facility], [place] [address]

## 2021-05-17 ENCOUNTER — Other Ambulatory Visit: Payer: Self-pay | Admitting: Family Medicine

## 2021-05-17 ENCOUNTER — Other Ambulatory Visit (HOSPITAL_COMMUNITY): Payer: Self-pay | Admitting: Family Medicine

## 2021-05-17 DIAGNOSIS — J439 Emphysema, unspecified: Secondary | ICD-10-CM

## 2021-05-26 ENCOUNTER — Ambulatory Visit (INDEPENDENT_AMBULATORY_CARE_PROVIDER_SITE_OTHER): Payer: Medicare Other | Admitting: Pulmonary Disease

## 2021-05-26 ENCOUNTER — Encounter: Payer: Self-pay | Admitting: Pulmonary Disease

## 2021-05-26 ENCOUNTER — Other Ambulatory Visit: Payer: Self-pay

## 2021-05-26 VITALS — BP 132/86 | HR 83 | Ht 65.0 in | Wt 206.0 lb

## 2021-05-26 DIAGNOSIS — J4520 Mild intermittent asthma, uncomplicated: Secondary | ICD-10-CM | POA: Diagnosis not present

## 2021-05-26 DIAGNOSIS — R634 Abnormal weight loss: Secondary | ICD-10-CM

## 2021-05-26 DIAGNOSIS — G4733 Obstructive sleep apnea (adult) (pediatric): Secondary | ICD-10-CM

## 2021-05-26 DIAGNOSIS — E662 Morbid (severe) obesity with alveolar hypoventilation: Secondary | ICD-10-CM | POA: Diagnosis not present

## 2021-05-26 MED ORDER — ALBUTEROL SULFATE HFA 108 (90 BASE) MCG/ACT IN AERS: 2.0000 | INHALATION_SPRAY | RESPIRATORY_TRACT | 2 refills | Status: AC | PRN

## 2021-05-26 NOTE — Patient Instructions (Signed)
Albuterol two puffs every 6 hours as needed for cough, wheeze, or chest congestion  Will arrange for CPAP titration study at [facility] Sleep lab  Follow up in 6 months in [place] office

## 2021-05-26 NOTE — Progress Notes (Signed)
[place] [facility], [facility], and Sleep Medicine  Chief Complaint  Patient presents with   Sleep Apnea    New machine with new setting Need refill on albuterol     Past Surgical History:  She  has a past surgical history that includes Tubal ligation; Colonoscopy (N/A, 12/25/2013); Cataract extraction w/PHACO (Left, 09/17/2015); Cyst excision (N/A, 09/12/2016); Cataract extraction w/PHACO (Right, 05/15/2017); Intraocular lens insertion (Bilateral, 2018); Colonoscopy with propofol (N/A, 10/04/2019); polypectomy (10/04/2019); ABDOMINAL AORTOGRAM W/LOWER EXTREMITY (N/A, 02/15/2021); PERIPHERAL VASCULAR BALLOON ANGIOPLASTY (Left, 02/15/2021); and PERIPHERAL VASCULAR INTERVENTION (Left, 02/15/2021).  Past Medical History:  OA, HLD, HTN, Myasthenia gravis, Gout, GERD, Depression, Anxiety, Pancreatitis July 2022  Constitutional:  BP 132/86   Pulse 83   Ht 5\' 5"  (1.651 m)   Wt 206 lb (93.4 kg)   SpO2 96%   BMI 34.28 kg/m   Brief Summary:  [name] is a 70 y.o. female former smoker with asthma, obstructive sleep apnea, and obesity hypoventilation syndrome      Subjective:   I last saw her in 2020.  She has lost 70 lbs since then.  She was in hospital in July for pancreatitis, need blood transfusion, and had cardiac stent.  Followed at wound care for chronic foot wound.  She feels her CPAP settings needed to be adjusted.  Using 1 liter oxygen with CPAP still.  Has intermittent cough.  Needs albuterol refill.  Physical Exam:   Appearance - well kempt   ENMT - no sinus tenderness, no oral exudate, no LAN, Mallampati 3 airway, no stridor, poor dentition  Respiratory - equal breath sounds bilaterally, no wheezing or rales  CV - s1s2 regular rate and rhythm, no murmurs  Ext - no clubbing, no edema  Skin - foot wound clean  Psych - normal mood and affect   Pulmonary testing:  PFT 02/22/16 >> FEV1 1.31 (65%), FEV1% 86, DLCO 79%  Sleep Tests:  PSG 05/15/05 >> AHI  34 Auto CPAP 08/05/18 to 09/03/18 >> used on 30 of 30 nights with average 8 hrs 14 min.  Average AHI 1.8 with median CPAP 7 and 95 th percentile CPAP 10 cm H2O.  Cardiac Tests:  Echo 07/16/13 >> EF 65 to 70%  Social History:  She  reports that she quit smoking about 16 years ago. Her smoking use included cigarettes. She started smoking about 51 years ago. She has a 72.00 pack-year smoking history. She has never used smokeless tobacco. She reports that she does not drink alcohol and does not use drugs.  Family History:  Her family history includes Crohn's disease in her daughter; Diabetes in her brother; Heart disease in her mother; Heart failure in her father; Lung disease in her father; Thyroid disease in her mother.     Assessment/Plan:   Obstructive sleep apnea. - she is compliant with CPAP and reports benefit - uses [employer] for her DME - likely needs pressure setting adjusted since she has lost significant weight - will arrange for CPAP titration study in [facility]   Obesity hypoventilation syndrome. - might not need supplemental oxygen at night since she has lost weight - will assess after review of her CPAP titration study - continue 1 liter at night with CPAP for now   Mild, intermittent asthma. - prn albuterol   Myasthenia gravis with ocular involvement. - followed by Dr. Leta Baptist with neurology  Time Spent Involved in Patient Care on Day of Examination:  32 minutes  Follow up:   Patient Instructions  Albuterol two puffs every 6 hours as needed for cough, wheeze, or chest congestion  Will arrange for CPAP titration study at [facility] Sleep lab  Follow up in 6 months in [place] office  Medication List:   Allergies as of 05/26/2021   No Known Allergies      Medication List        Accurate as of May 26, 2021 10:01 AM. If you have any questions, ask your nurse or doctor.          STOP taking these medications    metFORMIN 500 MG  tablet Commonly known as: GLUCOPHAGE Stopped by: Chesley Mires, MD       TAKE these medications    albuterol 108 (90 Base) MCG/ACT inhaler Commonly known as: VENTOLIN HFA Inhale 2 puffs into the lungs every 4 (four) hours as needed for wheezing or shortness of breath.   allopurinol 100 MG tablet Commonly known as: ZYLOPRIM TAKE 1 TABLET(100 MG) BY MOUTH DAILY   aspirin EC 81 MG tablet Take 1 tablet (81 mg total) by mouth every evening.   atorvastatin 40 MG tablet Commonly known as: LIPITOR TAKE 1 TABLET(40 MG) BY MOUTH DAILY AT 6 PM   busPIRone 5 MG tablet Commonly known as: BUSPAR TAKE 1 TABLET(5 MG) BY MOUTH TWICE DAILY FOR ANXIETY   clopidogrel 75 MG tablet Commonly known as: PLAVIX Take 75 mg by mouth daily.   colchicine 0.6 MG tablet Take 1 tablet (0.6 mg total) by mouth daily.   collagenase ointment Commonly known as: SANTYL Apply topically 2 (two) times daily.   feeding supplement Liqd Take 237 mLs by mouth 2 (two) times daily between meals.   feeding supplement Liqd Take 237 mLs by mouth 3 (three) times daily between meals.   folic acid 1 MG tablet Commonly known as: FOLVITE Take 1 tablet (1 mg total) by mouth daily.   furosemide 40 MG tablet Commonly known as: LASIX TAKE 1 TABLET(40 MG) BY MOUTH DAILY   guaiFENesin-dextromethorphan 100-10 MG/5ML syrup Commonly known as: ROBITUSSIN DM Take 10 mLs by mouth every 4 (four) hours as needed for cough.   HYDROcodone-acetaminophen 7.5-325 MG tablet Commonly known as: NORCO SMARTSIG:1 Tablet(s) By Mouth 1-2 Times Daily   Magnesium 250 MG Tabs Take 1 tablet (250 mg total) by mouth daily.   multivitamin with minerals Tabs tablet Take 1 tablet by mouth daily.   omeprazole 40 MG capsule Commonly known as: PRILOSEC TAKE 1 CAPSULE(40 MG) BY MOUTH DAILY   OXYGEN Inhale 1 L into the lungs at bedtime. IN ADDITION TO CPAP NIGHTLY   polyethylene glycol powder 17 GM/SCOOP powder Commonly known as:  MiraLax Mix 1 scoop (17 grams) of powder in water daily to help with constipation.   potassium chloride 10 MEQ tablet Commonly known as: KLOR-CON TAKE 1 TABLET BY MOUTH DAILY WITH FLUID PILL   pyridostigmine 60 MG tablet Commonly known as: MESTINON Take 1 tablet (60 mg total) by mouth 3 (three) times daily.   traZODone 100 MG tablet Commonly known as: DESYREL TAKE 1 TABLET BY MOUTH AT BEDTIME AS NEEDED FOR SLEEP        Signature:  Chesley Mires, MD Lane Pager - [phone] 05/26/2021, 10:01 AM

## 2021-06-18 ENCOUNTER — Encounter: Payer: Medicare Other | Admitting: Pulmonary Disease

## 2021-06-21 ENCOUNTER — Telehealth: Payer: Self-pay | Admitting: Pulmonary Disease

## 2021-06-21 DIAGNOSIS — G4733 Obstructive sleep apnea (adult) (pediatric): Secondary | ICD-10-CM

## 2021-06-21 NOTE — Telephone Encounter (Signed)
Called patient but she did not answer and her VM is not setup. Will attempt to call back later.

## 2021-06-21 NOTE — Telephone Encounter (Signed)
Called and spoke with patient. She stated that she would like to have an RX sent to [name] for her cpap machine. She stated that she was supposed to have a cpap titration done to determine what pressure setting she needs but the sleep [facility] was cancelled at [facility]. [facility] advised her that they would not be able to accommodate her request of having a hospital bed to sleep in. Patient states she sleeps in a hospital bed at home.   I looked at her download in [place] and it looks her current machine is set at 5-20cm.   VS, can you please advise? Thanks!

## 2021-06-21 NOTE — Telephone Encounter (Signed)
Patient is returning phone call. Patient phone number is [phone].

## 2021-06-21 NOTE — Telephone Encounter (Signed)
Called the pt and there was no answer- goes straight to VM and her VM is full, Will call back.

## 2021-06-22 NOTE — Telephone Encounter (Signed)
The purpose of the sleep study was to determine if she could have her CPAP settings reduced and whether she needs to continue using supplemental oxygen with CPAP.    Since she is not able to have in lab study done, then please arrange for the following: 1) have her auto CPAP change to 5 to 10 cm H2O 2) arrange for overnight oximetry with auto CPAP 5 to 10 cm H2O  Depending on results from these will determine if she can have home oxygen set up discontinued.

## 2021-06-23 NOTE — Telephone Encounter (Signed)
Orders placed per VS.   Call made to patient x2, voicemail has not been set up yet.   Nothing further needed at this time.

## 2021-06-24 ENCOUNTER — Telehealth: Payer: Self-pay | Admitting: Pulmonary Disease

## 2021-06-24 DIAGNOSIS — G4733 Obstructive sleep apnea (adult) (pediatric): Secondary | ICD-10-CM

## 2021-06-25 NOTE — Telephone Encounter (Signed)
Tried calling patient back in regards to her questions about ONO and HST. Unable to leave message to voicemail box not being set up. Will try again later.

## 2021-06-25 NOTE — Telephone Encounter (Signed)
Okay to try sending order to different DME for CPAP set up.  Please send order for auto CPAP 5 to 10 cm H2O with heated humidity and mask of choice.

## 2021-06-25 NOTE — Telephone Encounter (Signed)
Please see phone note from 06/21/21.  There is order from 06/23/21 regarding new CPAP machine.  Please check with her DME whether she needs a new sleep study prior to getting a new CPAP machine.  If she does, then she will need to be scheduled for a home sleep study since she can't be set up for an in lab sleep study.

## 2021-06-25 NOTE — Telephone Encounter (Signed)
Call made to [facility] with [name] states she is not due for a new machine until the end of next year. She states the patient is telling them she does not have the cpap machine they gave her form 2018 however she has continued to order supplies. Unable to ONO due to owing balance with [employer]. She does not need to repeat sleep study however she owes a balance and she is not due for a new machine.   It has not been 5 years so I am not sure the insurance would allow her to get another cpap machine.  LMTCB to make patient aware.    VS please be aware of the above. I am not sure what else we can do. We can try sending to another company?

## 2021-06-25 NOTE — Telephone Encounter (Signed)
New cpap order placed to [facility]. Nothing further needed.

## 2021-06-25 NOTE — Telephone Encounter (Signed)
Patient returned call, patient states she does not have the cpap machine. She spoke with the insurance and they told her they had no record of the cpap machine being given to her and that they have not paid for it. She states someone from [place] told her the machine would be shipped out in a few weeks, I inquired as to who told her this and she was not able to recall.

## 2021-06-25 NOTE — Telephone Encounter (Signed)
Dr Halford Chessman please advise:  Patient called and states that [facility] is not equipped enough for her to do a sleep study since she states she sleeps in a hospital bed. Patient also states that her cpap was misplaced when she was in the hospital on 7/21 during admission.  Pt states she really doesn't want to go through with all of the other test, she would just like her cpap.   Are we able to send RX in for a new cpap for patient?

## 2021-06-25 NOTE — Telephone Encounter (Signed)
Dr Halford Chessman please advise:   Patient called and states that [name] is not equipped enough for her to do a sleep study since she states she sleeps in a hospital bed. Patient also states that her cpap was misplaced when she was in the hospital on 7/21 during admission.   Pt states she really doesn't want to go through with all of the other test, she would just like her cpap.    Are we able to send RX in for a new cpap for patient?

## 2021-07-13 NOTE — Telephone Encounter (Signed)
LMTCB with patient

## 2021-07-13 NOTE — Telephone Encounter (Signed)
[name]; [name], [name] I received the following repy from out intake team:   Patient received a cpap from [place] 04-18-2017. I called [facility] and spoke to [place] who confirmed this. She will not be eligible for insurance to pay for a new cpap until 04-18-2022. I see in the notes that hers was misplaced while she was in the hospital July 2021.   I would say she could try to see if the hospital could help her out but since it has been a year and a half I don't know that they would.   I tried calling the patient but her vmx has not been set up so I will email her and let her know we cannot move forward with this referral and that she can (1) private pay for a machine or (2) wait until she is eligible for the new machine or (3) call her insurance and explain what happened and see if they will cover a new machine and have them send something in writing stating they will cover a new cpap prior to the 5 years being up.   Thank you,   Demetrius Charity

## 2021-07-14 ENCOUNTER — Telehealth: Payer: Self-pay | Admitting: Pulmonary Disease

## 2021-07-14 DIAGNOSIS — G4733 Obstructive sleep apnea (adult) (pediatric): Secondary | ICD-10-CM

## 2021-07-14 NOTE — Telephone Encounter (Signed)
Called pt and there was no answer and her VM was full  I am closing per protocol  Looks like [name] was unable to reach her by telephone as well but have emailed her

## 2021-07-14 NOTE — Telephone Encounter (Signed)
Called and spoke with pt to let her know the info from [name]. While speaking with pt, pt interrupted me and began yellling at me stating what [name] had said was not true. Each time I tried to speak, pt kept interrupting me yelling even more at me. Pt said that DME was not telling the truth compared to what her insurance was telling her.  Pt stated that she was going to find her a new DME. Nothing further needed.

## 2021-07-14 NOTE — Telephone Encounter (Signed)
Patient had a sleep study, needs CPAP machine. She found a company that is in network with her insurance, she just needs the order put in and copy of the sleep study sent to [employer]. Phone number: [phone], Fax Number: [phone]

## 2021-07-15 NOTE — Telephone Encounter (Signed)
Called patient but she did not answer. Her VM has not been setup. Will to call back later today.

## 2021-07-15 NOTE — Telephone Encounter (Signed)
VS please advise. Thanks! 

## 2021-07-15 NOTE — Telephone Encounter (Signed)
Please send order for auto CPAP 5 to 10 cm H2O with heated humidity.    Please arrange for overnight oximetry with CPAP to determine if she still needs supplemental oxygen at night.

## 2021-07-16 NOTE — Telephone Encounter (Signed)
I called the patient and there is already and order for ONO and CPAP as well from 06/25/21. Patient is aware she will get a call once everything is complete. Nothing further needed.

## 2021-09-08 ENCOUNTER — Other Ambulatory Visit (HOSPITAL_COMMUNITY): Payer: Self-pay | Admitting: Family Medicine

## 2021-09-08 DIAGNOSIS — Z1231 Encounter for screening mammogram for malignant neoplasm of breast: Secondary | ICD-10-CM

## 2021-09-27 ENCOUNTER — Other Ambulatory Visit: Payer: Self-pay

## 2021-09-27 ENCOUNTER — Ambulatory Visit (HOSPITAL_COMMUNITY)
Admission: RE | Admit: 2021-09-27 | Discharge: 2021-09-27 | Disposition: A | Discharge status: Home / Self Care | Payer: Medicare Other | Source: Ambulatory Visit | Attending: Family Medicine | Admitting: Family Medicine

## 2021-09-27 DIAGNOSIS — Z1231 Encounter for screening mammogram for malignant neoplasm of breast: Secondary | ICD-10-CM | POA: Insufficient documentation

## 2021-12-07 ENCOUNTER — Encounter: Payer: Self-pay | Admitting: Diagnostic Neuroimaging

## 2021-12-07 ENCOUNTER — Ambulatory Visit (INDEPENDENT_AMBULATORY_CARE_PROVIDER_SITE_OTHER): Payer: Medicare Other | Admitting: Diagnostic Neuroimaging

## 2021-12-07 VITALS — BP 122/76 | HR 64 | Ht 65.0 in | Wt 175.0 lb

## 2021-12-07 DIAGNOSIS — G7000 Myasthenia gravis without (acute) exacerbation: Secondary | ICD-10-CM | POA: Diagnosis not present

## 2021-12-07 MED ORDER — PYRIDOSTIGMINE BROMIDE 60 MG PO TABS: 60.0000 mg | ORAL_TABLET | Freq: Three times a day (TID) | ORAL | 4 refills | Status: DC

## 2021-12-07 NOTE — Progress Notes (Signed)
? ?[facility] ? ?PATIENT: [name] ?DOB: July 31, 1950 ? ?REFERRING CLINICIAN: Leslie Andrea, MD ?HISTORY FROM: patient  ?REASON FOR VISIT: follow up ? ? ?HISTORICAL ? ?CHIEF COMPLAINT:  ?Chief Complaint  ?Patient presents with  ? Follow-up  ?  RM 6 here for yearly f/u. Reports she has been doing ok some days are harder than others. She has stopped prednisone due to high blood sugar readings ( see ED note from July of 2022)   ? ? ?HISTORY OF PRESENT ILLNESS:  ? ?UPDATE (12/07/21, VRP): Since last visit, doing well. Now off prednisone and leg sores have healed. Some left ptosis intermittently. No double vision.  ? ?UPDATE (12/01/20, VRP): Since last visit, ocular MG is better. On prednisone and pyridostigmine. Having more issues with wounds and leg edema.  ? ?UPDATE (04/01/20, VRP): Since last visit, continues with ptosis and double vision. Symptoms are progressive. Severity is moderate. No alleviating or aggravating factors. Tolerating meds. ? ?UPDATE (01/21/20, VRP): Since last visit, doing well. Symptoms are stable. Severity is mild. No alleviating or aggravating factors. Tolerating pyridostigmine.   ? ?UPDATE (09/17/19, VRP): Since last visit, doing worse with left eye ptosis and double vision. Symptoms are progressive. No breathing issues. Some gen weakness. Tolerating pyridostigmine '60mg'$  three times a day.   ? ?UPDATE (02/06/19, VRP): Since last visit, doing about the same. Symptoms are stable. Severity is mild. No alleviating or aggravating factors. Tolerating pyridostigmine. Mild intermittent left ptosis. No SOB, chewing or swallowing issues.  ? ?UPDATE (01/02/18, VRP): Since last visit, doing well. Tolerating meds. No alleviating or aggravating factors. No SOB, swallow diff, speech diff. Vision stable.  ? ?UPDATE (07/05/17, VRP): Since last visit, doing well. Tolerating pyridostigmine ('60mg'$  three times a day). No alleviating or aggravating factors. Eyes are better. Has some flare of sxs  every month (2x per month; few hrs each time). No breathing, swallowing or speaking problems. CT chest reviewed with patient.  ? ?PRIOR HPI (03/22/17): 71 year old female with hypertension, hypercholesterolemia, anxiety, here for evaluation of double vision. 6 weeks ago patient had sudden onset of redness in both eyes. She then developed double vision and left eyelid drooping. She went to eye doctor, then referred to emergency room for MRI testing to rule out stroke. MRI of the brain and orbits were negative for acute process. Patient then had myasthenia gravis antibody panel testing which was notable for borderline positive anti-striation autoantibodies. Patient for here for further evaluation and management. Patient also reports a number of other symptoms including fatigue, swelling in legs, wheezing, headache, numbness, weakness, slurred speech. However these symptoms do not correlate with the recent 6 weeks of double vision and left eyelid weakness. Patient has been using an eye patch due to her double vision. Symptoms have been fairly consistent since onset. However later in her conversation patient states that symptoms fluctuate and are worse in the evening. ? ? ?REVIEW OF SYSTEMS: Full 14 system review of systems performed and negative with exception of: as per HPI.  ? ? ?ALLERGIES: ?No Known Allergies ? ?HOME MEDICATIONS: ?Outpatient Medications Prior to Visit  ?Medication Sig Dispense Refill  ? albuterol (VENTOLIN HFA) 108 (90 Base) MCG/ACT inhaler Inhale 2 puffs into the lungs every 4 (four) hours as needed for wheezing or shortness of breath. 8.5 g 2  ? allopurinol (ZYLOPRIM) 100 MG tablet TAKE 1 TABLET(100 MG) BY MOUTH DAILY 90 tablet 3  ? aspirin EC 81 MG tablet Take 1 tablet (81 mg total) by mouth every evening.    ?  atorvastatin (LIPITOR) 40 MG tablet TAKE 1 TABLET(40 MG) BY MOUTH DAILY AT 6 PM 90 tablet 1  ? busPIRone (BUSPAR) 5 MG tablet TAKE 1 TABLET(5 MG) BY MOUTH TWICE DAILY FOR ANXIETY 60 tablet  0  ? clopidogrel (PLAVIX) 75 MG tablet Take 75 mg by mouth daily.    ? colchicine 0.6 MG tablet Take 1 tablet (0.6 mg total) by mouth daily. 7 tablet 0  ? collagenase (SANTYL) ointment Apply topically 2 (two) times daily. 15 g 0  ? feeding supplement (ENSURE ENLIVE / ENSURE PLUS) LIQD Take 237 mLs by mouth 2 (two) times daily between meals. 237 mL 12  ? feeding supplement (ENSURE ENLIVE / ENSURE PLUS) LIQD Take 237 mLs by mouth 3 (three) times daily between meals. 237 mL 12  ? folic acid (FOLVITE) 1 MG tablet Take 1 tablet (1 mg total) by mouth daily.    ? furosemide (LASIX) 40 MG tablet TAKE 1 TABLET(40 MG) BY MOUTH DAILY 90 tablet 3  ? guaiFENesin-dextromethorphan (ROBITUSSIN DM) 100-10 MG/5ML syrup Take 10 mLs by mouth every 4 (four) hours as needed for cough. 118 mL 0  ? HYDROcodone-acetaminophen (NORCO) 7.5-325 MG tablet SMARTSIG:1 Tablet(s) By Mouth 1-2 Times Daily    ? Magnesium 250 MG TABS Take 1 tablet (250 mg total) by mouth daily. 30 tablet 11  ? Multiple Vitamin (MULTIVITAMIN WITH MINERALS) TABS tablet Take 1 tablet by mouth daily.    ? omeprazole (PRILOSEC) 40 MG capsule TAKE 1 CAPSULE(40 MG) BY MOUTH DAILY 90 capsule 3  ? OXYGEN Inhale 1 L into the lungs at bedtime. IN ADDITION TO CPAP NIGHTLY    ? polyethylene glycol powder (MIRALAX) 17 GM/SCOOP powder Mix 1 scoop (17 grams) of powder in water daily to help with constipation. 255 g 0  ? potassium chloride (KLOR-CON) 10 MEQ tablet TAKE 1 TABLET BY MOUTH DAILY WITH FLUID PILL 90 tablet 3  ? traZODone (DESYREL) 100 MG tablet TAKE 1 TABLET BY MOUTH AT BEDTIME AS NEEDED FOR SLEEP 90 tablet 2  ? pyridostigmine (MESTINON) 60 MG tablet Take 1 tablet (60 mg total) by mouth 3 (three) times daily. 270 tablet 4  ? ?No facility-administered medications prior to visit.  ? ? ?PAST MEDICAL HISTORY: ?Past Medical History:  ?Diagnosis Date  ? Achilles tendinitis   ? Anxiety   ? Asthma   ? Depression   ? GERD (gastroesophageal reflux disease)   ? Gout   ? H/O myasthenia  gravis   ? left eye  ? HTN (hypertension)   ? Hyperlipidemia   ? Low back pain   ? Obesity hypoventilation syndrome ([place])   ? Obstructive sleep apnea   ? Osteoarthritis   ? ? ?PAST SURGICAL HISTORY: ?Past Surgical History:  ?Procedure Laterality Date  ? ABDOMINAL AORTOGRAM W/LOWER EXTREMITY N/A 02/15/2021  ? Procedure: ABDOMINAL AORTOGRAM W/LOWER EXTREMITY;  Surgeon: Waynetta Sandy, MD;  Location: [facility];  Service: Cardiovascular;  Laterality: N/A;  ? CATARACT EXTRACTION W/PHACO Left 09/17/2015  ? Procedure: CATARACT EXTRACTION PHACO AND INTRAOCULAR LENS PLACEMENT LEFT EYE cde=8.58;  Surgeon: Tonny Branch, MD;  Location: [facility];  Service: Ophthalmology;  Laterality: Left;  ? CATARACT EXTRACTION W/PHACO Right 05/15/2017  ? Procedure: CATARACT EXTRACTION PHACO AND INTRAOCULAR LENS PLACEMENT (IOC);  Surgeon: Tonny Branch, MD;  Location: [facility];  Service: Ophthalmology;  Laterality: Right;  CDE: 6.34  ? COLONOSCOPY N/A 12/25/2013  ? Procedure: COLONOSCOPY;  Surgeon: Rogene Houston, MD;  [facility]: [facility];  Service: Endoscopy;  Laterality: N/A;  730-rescheduled Ann notified pt  ? COLONOSCOPY WITH PROPOFOL N/A 10/04/2019  ? Procedure: COLONOSCOPY WITH PROPOFOL;  Surgeon: Rogene Houston, MD;  [facility]: [facility];  Service: Endoscopy;  Laterality: N/A;  815  ? CYST EXCISION N/A 09/12/2016  ? Procedure: EXCISION SEBACEOUS CYST, BACK;  Surgeon: Aviva Signs, MD;  Location: [facility];  Service: General;  Laterality: N/A;  ? INTRAOCULAR LENS INSERTION Bilateral 2018  ? Dr. Tonny Branch   ? PERIPHERAL VASCULAR BALLOON ANGIOPLASTY Left 02/15/2021  ? Procedure: PERIPHERAL VASCULAR BALLOON ANGIOPLASTY;  Surgeon: Waynetta Sandy, MD;  Location: [facility];  Service: Cardiovascular;  Laterality: Left;  tp trunk  ? PERIPHERAL VASCULAR INTERVENTION Left 02/15/2021  ? Procedure: PERIPHERAL VASCULAR INTERVENTION;  Surgeon: Waynetta Sandy, MD;  Location: [facility];  Service:  Cardiovascular;  Laterality: Left;  SFA  ? POLYPECTOMY  10/04/2019  ? Procedure: POLYPECTOMY;  Surgeon: Rogene Houston, MD;  [facility]: [facility];  Service: Endoscopy;;  ? TUBAL LIGATION    ? ? ?FAMIL

## 2021-12-27 ENCOUNTER — Telehealth: Payer: Self-pay

## 2021-12-27 NOTE — Telephone Encounter (Signed)
Atc patient to ask her to bring SD card from CPAP into appointment tomorrow. Line rang once and no vm set up.

## 2021-12-28 ENCOUNTER — Encounter: Payer: Self-pay | Admitting: Pulmonary Disease

## 2021-12-28 ENCOUNTER — Ambulatory Visit (INDEPENDENT_AMBULATORY_CARE_PROVIDER_SITE_OTHER): Payer: Medicare Other | Admitting: Pulmonary Disease

## 2021-12-28 VITALS — BP 134/86 | HR 58 | Temp 98.0°F | Ht 65.0 in | Wt 183.8 lb

## 2021-12-28 DIAGNOSIS — G4734 Idiopathic sleep related nonobstructive alveolar hypoventilation: Secondary | ICD-10-CM | POA: Diagnosis not present

## 2021-12-28 NOTE — Progress Notes (Signed)
[facility]  Chief Complaint  Patient presents with   Follow-up    Not using cpap currently.  Some issues with breathing      Past Surgical History:  She  has a past surgical history that includes Tubal ligation; Colonoscopy (N/A, 12/25/2013); Cataract extraction w/PHACO (Left, 09/17/2015); Cyst excision (N/A, 09/12/2016); Cataract extraction w/PHACO (Right, 05/15/2017); Intraocular lens insertion (Bilateral, 2018); Colonoscopy with propofol (N/A, 10/04/2019); polypectomy (10/04/2019); ABDOMINAL AORTOGRAM W/LOWER EXTREMITY (N/A, 02/15/2021); PERIPHERAL VASCULAR BALLOON ANGIOPLASTY (Left, 02/15/2021); and PERIPHERAL VASCULAR INTERVENTION (Left, 02/15/2021).  Past Medical History:  OA, HLD, HTN, Myasthenia gravis, Gout, GERD, Depression, Anxiety, Pancreatitis July 2022  Constitutional:  BP 134/86 (BP Location: Left Arm, Patient Position: Sitting)   Pulse (!) 58   Temp 98 F (36.7 C) (Temporal)   Ht '5\' 5"'$  (1.651 m)   Wt 183 lb 12.8 oz (83.4 kg)   SpO2 98% Comment: ra  BMI 30.59 kg/m   Brief Summary:  [name] is a 71 y.o. female former smoker with asthma, obstructive sleep apnea, and obesity hypoventilation syndrome      Subjective:   Her weight was up to 271 lbs in 2012.  She is down to 183 lbs now.  She lost her CPAP machine after she was hospitalized and hasn't used in more than 1 year.  She is using oxygen at night.  Sleeping okay.  No having cough, wheeze, or chest congestion.   Physical Exam:   Appearance - well kempt, uses a walker  ENMT - no sinus tenderness, no oral exudate, no LAN, Mallampati 4 airway, no stridor  Respiratory - equal breath sounds bilaterally, no wheezing or rales  CV - s1s2 regular rate and rhythm, no murmurs  Ext - no clubbing, no edema  Skin - no rashes  Psych - normal mood and affect    Pulmonary testing:  PFT 02/22/16 >> FEV1 1.31 (65%), FEV1% 86, DLCO 79%  Sleep Tests:  PSG 05/15/05  >> AHI 34 Auto CPAP 08/05/18 to 09/03/18 >> used on 30 of 30 nights with average 8 hrs 14 min.  Average AHI 1.8 with median CPAP 7 and 95 th percentile CPAP 10 cm H2O.  Cardiac Tests:  Echo 07/16/13 >> EF 65 to 70%  Social History:  She  reports that she quit smoking about 16 years ago. Her smoking use included cigarettes. She started smoking about 52 years ago. She has a 72.00 pack-year smoking history. She has never used smokeless tobacco. She reports that she does not drink alcohol and does not use drugs.  Family History:  Her family history includes Crohn's disease in her daughter; Diabetes in her brother; Heart disease in her mother; Heart failure in her father; Lung disease in her father; Thyroid disease in her mother.     Assessment/Plan:    Nocturnal hypoxemia from obesity hypoventilation syndrome. - previously diagnosed with sleep apnea, but likely improved after weight loss - she is using 1 liter oxygen at night - will arrange for ONO with 1 liter and then determine if she needs to reconsider CPAP therapy   Mild, intermittent asthma. - prn albuterol   Myasthenia gravis with ocular involvement. - followed by Dr. Leta Baptist with neurology  Time Spent Involved in Patient Care on Day of Examination:  26 minutes  Follow up:   There are no Patient Instructions on file for this visit.  Medication List:   Allergies as of 12/28/2021   No Known Allergies  Medication List        Accurate as of December 28, 2021 10:30 AM. If you have any questions, ask your nurse or doctor.          albuterol 108 (90 Base) MCG/ACT inhaler Commonly known as: VENTOLIN HFA Inhale 2 puffs into the lungs every 4 (four) hours as needed for wheezing or shortness of breath.   allopurinol 100 MG tablet Commonly known as: ZYLOPRIM TAKE 1 TABLET(100 MG) BY MOUTH DAILY   aspirin EC 81 MG tablet Take 1 tablet (81 mg total) by mouth every evening.   atorvastatin 40 MG tablet Commonly known  as: LIPITOR TAKE 1 TABLET(40 MG) BY MOUTH DAILY AT 6 PM   busPIRone 5 MG tablet Commonly known as: BUSPAR TAKE 1 TABLET(5 MG) BY MOUTH TWICE DAILY FOR ANXIETY   clopidogrel 75 MG tablet Commonly known as: PLAVIX Take 75 mg by mouth daily.   colchicine 0.6 MG tablet Take 1 tablet (0.6 mg total) by mouth daily.   collagenase 250 UNIT/GM ointment Commonly known as: SANTYL Apply topically 2 (two) times daily.   feeding supplement Liqd Take 237 mLs by mouth 2 (two) times daily between meals.   feeding supplement Liqd Take 237 mLs by mouth 3 (three) times daily between meals.   folic acid 1 MG tablet Commonly known as: FOLVITE Take 1 tablet (1 mg total) by mouth daily.   furosemide 40 MG tablet Commonly known as: LASIX TAKE 1 TABLET(40 MG) BY MOUTH DAILY   guaiFENesin-dextromethorphan 100-10 MG/5ML syrup Commonly known as: ROBITUSSIN DM Take 10 mLs by mouth every 4 (four) hours as needed for cough.   HYDROcodone-acetaminophen 7.5-325 MG tablet Commonly known as: NORCO SMARTSIG:1 Tablet(s) By Mouth 1-2 Times Daily   Magnesium 250 MG Tabs Take 1 tablet (250 mg total) by mouth daily.   multivitamin with minerals Tabs tablet Take 1 tablet by mouth daily.   omeprazole 40 MG capsule Commonly known as: PRILOSEC TAKE 1 CAPSULE(40 MG) BY MOUTH DAILY   OXYGEN Inhale 1 L into the lungs at bedtime. IN ADDITION TO CPAP NIGHTLY   polyethylene glycol powder 17 GM/SCOOP powder Commonly known as: MiraLax Mix 1 scoop (17 grams) of powder in water daily to help with constipation.   potassium chloride 10 MEQ tablet Commonly known as: KLOR-CON TAKE 1 TABLET BY MOUTH DAILY WITH FLUID PILL   pyridostigmine 60 MG tablet Commonly known as: MESTINON Take 1 tablet (60 mg total) by mouth 3 (three) times daily.   traZODone 100 MG tablet Commonly known as: DESYREL TAKE 1 TABLET BY MOUTH AT BEDTIME AS NEEDED FOR SLEEP        Signature:  Chesley Mires, MD [place] Pager - [phone] 12/28/2021, 10:30 AM

## 2021-12-28 NOTE — Patient Instructions (Signed)
Will arrange for overnight oxygen test with 1 liter oxygen  Follow up in 1 year

## 2022-01-04 ENCOUNTER — Other Ambulatory Visit (HOSPITAL_COMMUNITY)
Admission: RE | Admit: 2022-01-04 | Discharge: 2022-01-04 | Disposition: A | Discharge status: Home / Self Care | Payer: Medicare Other | Source: Ambulatory Visit | Attending: Family Medicine | Admitting: Family Medicine

## 2022-01-04 ENCOUNTER — Other Ambulatory Visit (HOSPITAL_COMMUNITY): Payer: Self-pay | Admitting: Family Medicine

## 2022-01-04 ENCOUNTER — Other Ambulatory Visit: Payer: Self-pay | Admitting: Family Medicine

## 2022-01-04 ENCOUNTER — Ambulatory Visit (HOSPITAL_COMMUNITY)
Admission: RE | Admit: 2022-01-04 | Discharge: 2022-01-04 | Disposition: A | Discharge status: Home / Self Care | Payer: Medicare Other | Source: Ambulatory Visit | Attending: Family Medicine | Admitting: Family Medicine

## 2022-01-04 DIAGNOSIS — M79662 Pain in left lower leg: Secondary | ICD-10-CM | POA: Insufficient documentation

## 2022-01-04 DIAGNOSIS — R531 Weakness: Secondary | ICD-10-CM | POA: Insufficient documentation

## 2022-01-04 LAB — CBC
HCT: 35.3 % — ABNORMAL LOW (ref 36.0–46.0)
Hemoglobin: 11 g/dL — ABNORMAL LOW (ref 12.0–15.0)
MCH: 29.6 pg (ref 26.0–34.0)
MCHC: 31.2 g/dL (ref 30.0–36.0)
MCV: 94.9 fL (ref 80.0–100.0)
Platelets: 230 10*3/uL (ref 150–400)
RBC: 3.72 MIL/uL — ABNORMAL LOW (ref 3.87–5.11)
RDW: 14.1 % (ref 11.5–15.5)
WBC: 7.4 10*3/uL (ref 4.0–10.5)
nRBC: 0 % (ref 0.0–0.2)

## 2022-01-04 LAB — D-DIMER, QUANTITATIVE: D-Dimer, Quant: 0.74 ug/mL-FEU — ABNORMAL HIGH (ref 0.00–0.50)

## 2022-01-19 ENCOUNTER — Ambulatory Visit (HOSPITAL_COMMUNITY)
Admission: RE | Admit: 2022-01-19 | Discharge: 2022-01-19 | Disposition: A | Discharge status: Home / Self Care | Payer: Medicare Other | Source: Ambulatory Visit | Attending: Family Medicine | Admitting: Family Medicine

## 2022-01-19 DIAGNOSIS — M79662 Pain in left lower leg: Secondary | ICD-10-CM | POA: Insufficient documentation

## 2022-01-24 ENCOUNTER — Telehealth: Payer: Self-pay | Admitting: Pulmonary Disease

## 2022-01-24 DIAGNOSIS — G4733 Obstructive sleep apnea (adult) (pediatric): Secondary | ICD-10-CM

## 2022-01-24 NOTE — Telephone Encounter (Signed)
ONO with 1 liter 01/15/22 >> test time 11 hrs 34 min.  Basal SpO2 86.7%, low SpO2 50%.  Spent 4 hrs 22 min of test time with SpO2 < 88%.   Please let her know that her oxygen study shows her oxygen level still gets very low at different times during the night and she almost certainly still has significant sleep apnea.  Using oxygen at night is sufficient.  She needs to have repeat sleep study done to determine if she still has sleep apnea.  If she is agreeable, then please schedule an in lab split night sleep study.

## 2022-01-26 NOTE — Telephone Encounter (Signed)
ATC patients preferred number on file and went straight to vm. VM has not been set up yet.

## 2022-01-26 NOTE — Telephone Encounter (Signed)
Then please schedule her for a home sleep study.  She shouldn't use her oxygen at night during the home sleep study.

## 2022-01-26 NOTE — Telephone Encounter (Signed)
Called and notified patient of plan to do HST and for her not to use Oxygen while doing HST. She was agreeable to this plan. Order placed. Nothing further needed.

## 2022-01-26 NOTE — Telephone Encounter (Signed)
Called and spoke to patient she voiced understanding of her results.  She states she is agreeable to an in lab split study but last time she had one ordered, [facility] told her that she could not use a hospital bed overnight in the sleep lab. The patient states she has to sleep in a hospital bed.   Called and spoke to front desk agent at sleep disorder center [phone] and she confirms that they do not allow patients to sleep in a hospital bed during the studies.   Dr. Halford Chessman please advise.

## 2022-02-19 ENCOUNTER — Other Ambulatory Visit: Payer: Self-pay | Admitting: Diagnostic Neuroimaging

## 2022-02-28 ENCOUNTER — Telehealth: Payer: Self-pay | Admitting: Diagnostic Neuroimaging

## 2022-02-28 NOTE — Telephone Encounter (Signed)
You can schedule pt appt

## 2022-02-28 NOTE — Telephone Encounter (Signed)
Pt is calling and requesting a appointment with provider because she is having problem seeing.

## 2022-03-07 ENCOUNTER — Encounter: Payer: Self-pay | Admitting: Diagnostic Neuroimaging

## 2022-03-07 ENCOUNTER — Ambulatory Visit (INDEPENDENT_AMBULATORY_CARE_PROVIDER_SITE_OTHER): Payer: Medicare Other | Admitting: Diagnostic Neuroimaging

## 2022-03-07 VITALS — BP 139/61 | HR 73 | Ht 65.0 in | Wt 208.4 lb

## 2022-03-07 DIAGNOSIS — G7000 Myasthenia gravis without (acute) exacerbation: Secondary | ICD-10-CM

## 2022-03-07 DIAGNOSIS — R1310 Dysphagia, unspecified: Secondary | ICD-10-CM

## 2022-03-07 MED ORDER — PYRIDOSTIGMINE BROMIDE 60 MG PO TABS
60.0000 mg | ORAL_TABLET | Freq: Three times a day (TID) | ORAL | 4 refills | Status: DC
Start: 2022-03-07 — End: 2022-12-13

## 2022-03-07 NOTE — Progress Notes (Signed)
[facility]  PATIENT: [name] DOB: May 05, 1951  REFERRING CLINICIAN: Leslie Andrea, MD HISTORY FROM: patient  REASON FOR VISIT: follow up   HISTORICAL  CHIEF COMPLAINT:  Chief Complaint  Patient presents with   Follow-up    Pt reports her eyes have been bothering her more. She states they are "crossing up", some days are worse than other days. Room 6 alone    HISTORY OF PRESENT ILLNESS:   UPDATE (03/07/22, VRP): Since last visit, doing well. Some double vision intermittently. No gen weakness or major speech issues. Rare swallow issues. Marland Kitchen UPDATE (12/07/21, VRP): Since last visit, doing well. Now off prednisone and leg sores have healed. Some left ptosis intermittently. No double vision.   UPDATE (12/01/20, VRP): Since last visit, ocular MG is better. On prednisone and pyridostigmine. Having more issues with wounds and leg edema.   UPDATE (04/01/20, VRP): Since last visit, continues with ptosis and double vision. Symptoms are progressive. Severity is moderate. No alleviating or aggravating factors. Tolerating meds.  UPDATE (01/21/20, VRP): Since last visit, doing well. Symptoms are stable. Severity is mild. No alleviating or aggravating factors. Tolerating pyridostigmine.    UPDATE (09/17/19, VRP): Since last visit, doing worse with left eye ptosis and double vision. Symptoms are progressive. No breathing issues. Some gen weakness. Tolerating pyridostigmine '60mg'$  three times a day.    UPDATE (02/06/19, VRP): Since last visit, doing about the same. Symptoms are stable. Severity is mild. No alleviating or aggravating factors. Tolerating pyridostigmine. Mild intermittent left ptosis. No SOB, chewing or swallowing issues.   UPDATE (01/02/18, VRP): Since last visit, doing well. Tolerating meds. No alleviating or aggravating factors. No SOB, swallow diff, speech diff. Vision stable.   UPDATE (07/05/17, VRP): Since last visit, doing well. Tolerating pyridostigmine  ('60mg'$  three times a day). No alleviating or aggravating factors. Eyes are better. Has some flare of sxs every month (2x per month; few hrs each time). No breathing, swallowing or speaking problems. CT chest reviewed with patient.   PRIOR HPI (03/22/17): 71 year old female with hypertension, hypercholesterolemia, anxiety, here for evaluation of double vision. 6 weeks ago patient had sudden onset of redness in both eyes. She then developed double vision and left eyelid drooping. She went to eye doctor, then referred to emergency room for MRI testing to rule out stroke. MRI of the brain and orbits were negative for acute process. Patient then had myasthenia gravis antibody panel testing which was notable for borderline positive anti-striation autoantibodies. Patient for here for further evaluation and management. Patient also reports a number of other symptoms including fatigue, swelling in legs, wheezing, headache, numbness, weakness, slurred speech. However these symptoms do not correlate with the recent 6 weeks of double vision and left eyelid weakness. Patient has been using an eye patch due to her double vision. Symptoms have been fairly consistent since onset. However later in her conversation patient states that symptoms fluctuate and are worse in the evening.   REVIEW OF SYSTEMS: Full 14 system review of systems performed and negative with exception of: as per HPI.    ALLERGIES: No Known Allergies  HOME MEDICATIONS: Outpatient Medications Prior to Visit  Medication Sig Dispense Refill   albuterol (VENTOLIN HFA) 108 (90 Base) MCG/ACT inhaler Inhale 2 puffs into the lungs every 4 (four) hours as needed for wheezing or shortness of breath. 8.5 g 2   allopurinol (ZYLOPRIM) 100 MG tablet TAKE 1 TABLET(100 MG) BY MOUTH DAILY 90 tablet 3   aspirin EC 81 MG  tablet Take 1 tablet (81 mg total) by mouth every evening.     atorvastatin (LIPITOR) 40 MG tablet TAKE 1 TABLET(40 MG) BY MOUTH DAILY AT 6 PM 90  tablet 1   busPIRone (BUSPAR) 5 MG tablet TAKE 1 TABLET(5 MG) BY MOUTH TWICE DAILY FOR ANXIETY 60 tablet 0   clopidogrel (PLAVIX) 75 MG tablet Take 75 mg by mouth daily.     colchicine 0.6 MG tablet Take 1 tablet (0.6 mg total) by mouth daily. 7 tablet 0   collagenase (SANTYL) ointment Apply topically 2 (two) times daily. 15 g 0   feeding supplement (ENSURE ENLIVE / ENSURE PLUS) LIQD Take 237 mLs by mouth 2 (two) times daily between meals. 237 mL 12   feeding supplement (ENSURE ENLIVE / ENSURE PLUS) LIQD Take 237 mLs by mouth 3 (three) times daily between meals. 740 mL 12   folic acid (FOLVITE) 1 MG tablet Take 1 tablet (1 mg total) by mouth daily.     furosemide (LASIX) 40 MG tablet TAKE 1 TABLET(40 MG) BY MOUTH DAILY 90 tablet 3   guaiFENesin-dextromethorphan (ROBITUSSIN DM) 100-10 MG/5ML syrup Take 10 mLs by mouth every 4 (four) hours as needed for cough. 118 mL 0   HYDROcodone-acetaminophen (NORCO) 7.5-325 MG tablet SMARTSIG:1 Tablet(s) By Mouth 1-2 Times Daily     Magnesium 250 MG TABS Take 1 tablet (250 mg total) by mouth daily. 30 tablet 11   Multiple Vitamin (MULTIVITAMIN WITH MINERALS) TABS tablet Take 1 tablet by mouth daily.     omeprazole (PRILOSEC) 40 MG capsule TAKE 1 CAPSULE(40 MG) BY MOUTH DAILY 90 capsule 3   OXYGEN Inhale 1 L into the lungs at bedtime. IN ADDITION TO CPAP NIGHTLY     polyethylene glycol powder (MIRALAX) 17 GM/SCOOP powder Mix 1 scoop (17 grams) of powder in water daily to help with constipation. 255 g 0   potassium chloride (KLOR-CON) 10 MEQ tablet TAKE 1 TABLET BY MOUTH DAILY WITH FLUID PILL 90 tablet 3   traZODone (DESYREL) 100 MG tablet TAKE 1 TABLET BY MOUTH AT BEDTIME AS NEEDED FOR SLEEP 90 tablet 2   pyridostigmine (MESTINON) 60 MG tablet TAKE 1 TABLET(60 MG) BY MOUTH THREE TIMES DAILY 270 tablet 4   No facility-administered medications prior to visit.    PAST MEDICAL HISTORY: Past Medical History:  Diagnosis Date   Achilles tendinitis    Anxiety     Asthma    Depression    GERD (gastroesophageal reflux disease)    Gout    H/O myasthenia gravis    left eye   HTN (hypertension)    Hyperlipidemia    Low back pain    Obesity hypoventilation syndrome ([place])    Obstructive sleep apnea    Osteoarthritis     PAST SURGICAL HISTORY: Past Surgical History:  Procedure Laterality Date   ABDOMINAL AORTOGRAM W/LOWER EXTREMITY N/A 02/15/2021   Procedure: ABDOMINAL AORTOGRAM W/LOWER EXTREMITY;  Surgeon: Waynetta Sandy, MD;  Location: [facility];  Service: Cardiovascular;  Laterality: N/A;   CATARACT EXTRACTION W/PHACO Left 09/17/2015   Procedure: CATARACT EXTRACTION PHACO AND INTRAOCULAR LENS PLACEMENT LEFT EYE cde=8.58;  Surgeon: Tonny Branch, MD;  Location: [facility];  Service: Ophthalmology;  Laterality: Left;   CATARACT EXTRACTION W/PHACO Right 05/15/2017   Procedure: CATARACT EXTRACTION PHACO AND INTRAOCULAR LENS PLACEMENT (IOC);  Surgeon: Tonny Branch, MD;  Location: [facility];  Service: Ophthalmology;  Laterality: Right;  CDE: 6.34   COLONOSCOPY N/A 12/25/2013   Procedure: COLONOSCOPY;  Surgeon:  Rogene Houston, MD;  [facility];  Service: Endoscopy;  Laterality: N/A;  730-rescheduled Ann notified pt   COLONOSCOPY WITH PROPOFOL N/A 10/04/2019   Procedure: COLONOSCOPY WITH PROPOFOL;  Surgeon: Rogene Houston, MD;  [facility];  Service: Endoscopy;  Laterality: N/A;  815   CYST EXCISION N/A 09/12/2016   Procedure: EXCISION SEBACEOUS CYST, BACK;  Surgeon: Aviva Signs, MD;  Location: [facility];  Service: General;  Laterality: N/A;   INTRAOCULAR LENS INSERTION Bilateral 2018   Dr. Tonny Branch    PERIPHERAL VASCULAR BALLOON ANGIOPLASTY Left 02/15/2021   Procedure: PERIPHERAL VASCULAR BALLOON ANGIOPLASTY;  Surgeon: Waynetta Sandy, MD;  Location: [facility];  Service: Cardiovascular;  Laterality: Left;  tp trunk   PERIPHERAL VASCULAR INTERVENTION Left 02/15/2021   Procedure: PERIPHERAL VASCULAR  INTERVENTION;  Surgeon: Waynetta Sandy, MD;  Location: [facility];  Service: Cardiovascular;  Laterality: Left;  SFA   POLYPECTOMY  10/04/2019   Procedure: POLYPECTOMY;  Surgeon: Rogene Houston, MD;  [facility];  Service: Endoscopy;;   TUBAL LIGATION      FAMILY HISTORY: Family History  Problem Relation Age of Onset   Heart disease Mother    Thyroid disease Mother    Heart failure Father    Lung disease Father    Diabetes Brother    Crohn's disease Daughter     SOCIAL HISTORY:  Social History   Socioeconomic History   Marital status: Widowed    Spouse name: Not on file   Number of children: 3   Years of education: 14   Highest education level: Not on file  Occupational History   Occupation: disabled    Employer: UNEMPLOYED  Tobacco Use   Smoking status: Former    Packs/day: 2.00    Years: 36.00    Total pack years: 72.00    Types: Cigarettes    Start date: 06/25/1969    Quit date: 03/25/2005    Years since quitting: 16.9   Smokeless tobacco: Never  Vaping Use   Vaping Use: Never used  Substance and Sexual Activity   Alcohol use: No    Alcohol/week: 0.0 standard drinks of alcohol   Drug use: No   Sexual activity: Not Currently    Birth control/protection: Post-menopausal  Other Topics Concern   Not on file  Social History Narrative   Originally from [place]. She has always lived in [place]. 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 No pets currently. No bird, mold, or hot tub exposure.    Lives alone   No caffeine   Social Determinants of Health   Financial Resource Strain: Not on file  Food Insecurity: Not on file  Transportation Needs: Not on file  Physical Activity: Not on file  Stress: Not on file  Social Connections: Not on file  Intimate Partner Violence: Not on file     PHYSICAL EXAM  GENERAL EXAM/CONSTITUTIONAL: Vitals:  Vitals:   03/07/22 1358  BP: 139/61  Pulse: 73  Weight: 208  lb 6 oz (94.5 kg)  Height: '5\' 5"'$  (1.651 m)   Body mass index is 34.68 kg/m. No results found. Patient is in no distress; well developed, nourished and groomed; neck is supple  CARDIOVASCULAR: Examination of carotid arteries is normal; no carotid bruits Regular rate and rhythm, no murmurs Examination of peripheral vascular system by observation and palpation is normal  EYES: Ophthalmoscopic exam of optic discs and posterior segments  is normal; no papilledema or hemorrhages  MUSCULOSKELETAL: Gait, strength, tone, movements noted in Neurologic exam below  NEUROLOGIC: MENTAL STATUS:      No data to display         awake, alert, oriented to person, place and time recent and remote memory intact normal attention and concentration language fluent, comprehension intact, naming intact,  fund of knowledge appropriate  CRANIAL NERVE:  2nd - no papilledema on fundoscopic exam 2nd, 3rd, 4th, 6th - pupils equal and reactive to light, visual fields full to confrontation, extraocular muscles intact, MILD LEFT PTOSIS; NO DOUBLE VISION 5th - facial sensation symmetric 7th - facial strength symmetric 8th - hearing intact 9th - palate elevates symmetrically, uvula midline 11th - shoulder shrug symmetric 12th - tongue protrusion midline  MOTOR:  normal bulk and tone, DIFFUSE 4/5 in the BUE, BLE  SENSORY:  normal and symmetric to light touch  COORDINATION:  finger-nose-finger, fine finger movements normal  REFLEXES:  deep tendon reflexes TRACE and symmetric  GAIT/STATION:  narrow based gait    DIAGNOSTIC DATA (LABS, IMAGING, TESTING) - I reviewed patient records, labs, notes, testing and imaging myself where available.  Lab Results  Component Value Date   WBC 7.4 01/04/2022   HGB 11.0 (L) 01/04/2022   HCT 35.3 (L) 01/04/2022   MCV 94.9 01/04/2022   PLT 230 01/04/2022      Component Value Date/Time   NA 141 02/24/2021 0530   K 4.2 02/24/2021 0530   CL 110  02/24/2021 0530   CO2 26 02/24/2021 0530   GLUCOSE 72 02/24/2021 0530   BUN 13 02/24/2021 0530   CREATININE 0.97 02/24/2021 0530   CREATININE 4.23 (H) 10/01/2020 1204   CALCIUM 8.1 (L) 02/24/2021 0530   PROT 6.0 (L) 02/12/2021 0349   ALBUMIN 1.9 (L) 02/15/2021 0755   AST 27 02/12/2021 0349   ALT 17 02/12/2021 0349   ALKPHOS 74 02/12/2021 0349   BILITOT 0.6 02/12/2021 0349   GFRNONAA >60 02/24/2021 0530   GFRNONAA 10 (L) 10/01/2020 1204   GFRAA 11 (L) 10/01/2020 1204   Lab Results  Component Value Date   CHOL 140 01/08/2020   HDL 56 01/08/2020   LDLCALC 61 01/08/2020   LDLDIRECT 157 (H) 02/25/2013   TRIG 144 01/08/2020   CHOLHDL 2.5 01/08/2020   Lab Results  Component Value Date   HGBA1C 4.9 02/12/2021   Lab Results  Component Value Date   EXNTZGYF74 944 02/21/2021   Lab Results  Component Value Date   TSH 1.618 02/16/2021    02/24/17 MRI brain / orbits [I reviewed images myself and agree with interpretation. -VRP]  - No explanation for symptoms. No infarct, compressive lesion, or neuritis findings.  03/01/17 labs - anti AchR binding, blocking, modulation - normal - anti striational ab - 1:40 (h)  01/13/20      AChR Binding Ab, Serum 0.00 - 0.24 nmol/L 0.05    01/21/20      MuSK Antibodies U/mL <1.0    04/05/17 CT chest - No evidence of residual thymic tissue or or thymoma. - No acute abnormality noted.     ASSESSMENT AND PLAN  71 y.o. year old female here with new onset of double vision left eyelid ptosis, with positive ice pack test in office, and slightly positive anti-striation all antibody testing. Findings consistent with ocular myasthenia gravis.   Dx:  1. Ocular myasthenia (HCC)       PLAN:  MYASTHENIA GRAVIS (ocular)  - continue pyridostigmine to '60mg'$   three times a day   - refer to speech therapy eval for mild intermittent dysphagia  - off prednisone due to side effects (hyperglycemia, skin reaction)  - cautioned patient if she  develops breathing, chewing, swallowing sxs; patient to contact us right away or go to ER if bulbar or generalized symptoms develop  Meds ordered this encounter  Medications   pyridostigmine (MESTINON) 60 MG tablet    Sig: Take 1 tablet (60 mg total) by mouth 3 (three) times daily.    Dispense:  270 tablet    Refill:  4   Return in about 1 year (around 03/08/2023).    Penni Bombard, MD 0/11/2589, 0:28 PM Certified in Neurology, Neurophysiology and Neuroimaging  [facility] [address], [place], [place] [address] [phone]

## 2022-03-09 ENCOUNTER — Other Ambulatory Visit (HOSPITAL_COMMUNITY): Payer: Self-pay | Admitting: Specialist

## 2022-03-09 DIAGNOSIS — R1312 Dysphagia, oropharyngeal phase: Secondary | ICD-10-CM

## 2022-03-09 DIAGNOSIS — G7000 Myasthenia gravis without (acute) exacerbation: Secondary | ICD-10-CM

## 2022-03-17 ENCOUNTER — Encounter (HOSPITAL_COMMUNITY): Payer: Self-pay | Admitting: Speech Pathology

## 2022-03-17 ENCOUNTER — Ambulatory Visit (HOSPITAL_COMMUNITY)
Admission: RE | Admit: 2022-03-17 | Discharge: 2022-03-17 | Disposition: A | Discharge status: Home / Self Care | Payer: Medicare Other | Source: Ambulatory Visit | Attending: Diagnostic Neuroimaging | Admitting: Diagnostic Neuroimaging

## 2022-03-17 ENCOUNTER — Ambulatory Visit (HOSPITAL_COMMUNITY): Payer: Medicare Other | Attending: Diagnostic Neuroimaging | Admitting: Speech Pathology

## 2022-03-17 DIAGNOSIS — R1312 Dysphagia, oropharyngeal phase: Secondary | ICD-10-CM | POA: Insufficient documentation

## 2022-03-17 DIAGNOSIS — G7000 Myasthenia gravis without (acute) exacerbation: Secondary | ICD-10-CM | POA: Diagnosis present

## 2022-03-17 NOTE — Therapy (Signed)
[facility] [place], [place], [address] Phone: [phone]   Fax:  [phone]  Modified Barium Swallow  Patient Details  Name: [name] MRN: [id number] Date of Birth: 07-09-1951 No data recorded  Encounter Date: 03/17/2022   End of Session - 03/17/22 1322     Visit Number 1    Number of Visits 1    Authorization Type UHC Medicare    SLP Start Time 1027    SLP Stop Time  2536    SLP Time Calculation (min) 30 min    Activity Tolerance Patient tolerated treatment well             Past Medical History:  Diagnosis Date   Achilles tendinitis    Anxiety    Asthma    Depression    GERD (gastroesophageal reflux disease)    Gout    H/O myasthenia gravis    left eye   HTN (hypertension)    Hyperlipidemia    Low back pain    Obesity hypoventilation syndrome ([place])    Obstructive sleep apnea    Osteoarthritis     Past Surgical History:  Procedure Laterality Date   ABDOMINAL AORTOGRAM W/LOWER EXTREMITY N/A 02/15/2021   Procedure: ABDOMINAL AORTOGRAM W/LOWER EXTREMITY;  Surgeon: Waynetta Sandy, MD;  Location: [facility];  Service: Cardiovascular;  Laterality: N/A;   CATARACT EXTRACTION W/PHACO Left 09/17/2015   Procedure: CATARACT EXTRACTION PHACO AND INTRAOCULAR LENS PLACEMENT LEFT EYE cde=8.58;  Surgeon: Tonny Branch, MD;  Location: [facility];  Service: Ophthalmology;  Laterality: Left;   CATARACT EXTRACTION W/PHACO Right 05/15/2017   Procedure: CATARACT EXTRACTION PHACO AND INTRAOCULAR LENS PLACEMENT (IOC);  Surgeon: Tonny Branch, MD;  Location: [facility];  Service: Ophthalmology;  Laterality: Right;  CDE: 6.34   COLONOSCOPY N/A 12/25/2013   Procedure: COLONOSCOPY;  Surgeon: Rogene Houston, MD;  [facility];  Service: Endoscopy;  Laterality: N/A;  730-rescheduled Ann notified pt   COLONOSCOPY WITH PROPOFOL N/A 10/04/2019   Procedure: COLONOSCOPY WITH PROPOFOL;  Surgeon: Rogene Houston,  MD;  [facility];  Service: Endoscopy;  Laterality: N/A;  815   CYST EXCISION N/A 09/12/2016   Procedure: EXCISION SEBACEOUS CYST, BACK;  Surgeon: Aviva Signs, MD;  Location: [facility];  Service: General;  Laterality: N/A;   INTRAOCULAR LENS INSERTION Bilateral 2018   Dr. Tonny Branch    PERIPHERAL VASCULAR BALLOON ANGIOPLASTY Left 02/15/2021   Procedure: PERIPHERAL VASCULAR BALLOON ANGIOPLASTY;  Surgeon: Waynetta Sandy, MD;  Location: [facility];  Service: Cardiovascular;  Laterality: Left;  tp trunk   PERIPHERAL VASCULAR INTERVENTION Left 02/15/2021   Procedure: PERIPHERAL VASCULAR INTERVENTION;  Surgeon: Waynetta Sandy, MD;  Location: [facility];  Service: Cardiovascular;  Laterality: Left;  SFA   POLYPECTOMY  10/04/2019   Procedure: POLYPECTOMY;  Surgeon: Rogene Houston, MD;  [facility];  Service: Endoscopy;;   TUBAL LIGATION      There were no vitals filed for this visit.        General - 03/17/22 1311       General Information   Date of Onset 02/22/22    HPI [name] is a 71 yo female who was referred by Dr. Andrey Spearman for MBSS due to intermittent reports of dysphagia and h/o occular myasthenia gravis. She had BSE at [facility] in July 2022 with recommendation for D3/mech soft and thin liquids. Pt crushes her  medication in puree.    Type of Study MBS-Modified Barium Swallow Study    Diet Prior to this Study Regular;Dysphagia 3 (soft);Thin liquids    Temperature Spikes Noted No    Respiratory Status Room air    History of Recent Intubation No    Behavior/Cognition Alert;Cooperative;Pleasant mood    Oral Cavity Assessment Within Functional Limits    Oral Care Completed by SLP No    Oral Cavity - Dentition Missing dentition   only has some sparse lower dentition   Vision Functional for self feeding    Self-Feeding Abilities Able to feed self    Patient Positioning Upright in chair    Baseline Vocal Quality  Normal    Volitional Cough Strong    Volitional Swallow Able to elicit    Anatomy Within functional limits    Pharyngeal Secretions Not observed secondary MBS                Oral Preparation/Oral Phase - 03/17/22 1314       Oral Preparation/Oral Phase   Oral Phase Impaired      Oral - Thin   Oral - Thin Teaspoon Within functional limits    Oral - Thin Cup Within functional limits    Oral - Thin Straw Within functional limits      Oral - Solids   Oral - Puree Within functional limits    Oral - Regular Delayed A-P transit    Oral - Pill Delayed A-P transit      Electrical stimulation - Oral Phase   Was Electrical Stimulation Used No              Pharyngeal Phase - 03/17/22 1316       Pharyngeal Phase   Pharyngeal Phase Impaired      Pharyngeal - Thin   Pharyngeal- Thin Teaspoon Swallow initiation at pyriform sinus    Pharyngeal- Thin Cup Swallow initiation at pyriform sinus;Penetration/Apiration after swallow    Pharyngeal Material enters airway, CONTACTS cords and then ejected out    Pharyngeal- Thin Straw Swallow initiation at pyriform sinus;Pharyngeal residue - valleculae;Penetration/Aspiration during swallow    Pharyngeal Material enters airway, remains ABOVE vocal cords then ejected out      Pharyngeal - Solids   Pharyngeal- Puree Within functional limits    Pharyngeal- Regular Within functional limits    Pharyngeal- Pill Other (Comment);Pharyngeal residue - valleculae;Penetration/Aspiration during swallow   pill remained in valleculae and was penetrated and expectorated, silent aspiration of thins trace amount down posterior wall     Electrical Stimulation - Pharyngeal Phase   Was Electrical Stimulation Used No              Cricopharyngeal Phase - 03/17/22 1321       Cervical Esophageal Phase   Cervical Esophageal Phase Within functional limits                      Plan - 03/17/22 1322     Clinical Impression Statement Pt  presents with mild/mod oropharyngeal phase dysphagia with impaired lingual movement with mixed consistencies (barium tablet with cup thin) with delayed anterior posterior transit of pill and resulting trace silent aspiration of thins down the posterior tracheal wall when manipulating pill orally. Pt with slight delay in swallow trigger with liquids, however functional. The barium tablet became delayed in the valleculae and then penetrated and expectorated (Pt with bony calcifications along C5-6 which may impinge epiglottic deflection once pill already lodged in vallecular  space). Pt was unable to swallow the pill in puree either and it was expectorated. Pt reports some difficulty swallowing meats. She only has a few lower teeth. Recommend D3/mech soft and thin liqudis and crushe pills in puree. Pt given written recommendations and appreciative of information. No further SLP services indicated at this time.    Consulted and Agree with Plan of Care Patient             Patient will benefit from skilled therapeutic intervention in order to improve the following deficits and impairments:   Dysphagia, oropharyngeal phase     Recommendations/Treatment - 03/17/22 1321       Swallow Evaluation Recommendations   SLP Diet Recommendations Dysphagia 3 (mechanical soft);Thin    Liquid Administration via Cup;Straw    Medication Administration Crushed with puree    Supervision Patient able to self feed    Postural Changes Seated upright at 90 degrees;Remain upright for at least 30 minutes after feeds/meals              Prognosis - 03/17/22 1322       Prognosis   Prognosis for Safe Diet Advancement Good      Individuals Consulted   Consulted and Agree with Results and Recommendations Patient    Report Sent to  Referring physician             Problem List Patient Active Problem List   Diagnosis Date Noted   Hypomagnesemia 02/12/2021   Social problem 02/12/2021   DM2 (diabetes  mellitus, type 2) ([place]) 02/12/2021   Open wound of plantar aspect of foot    Osteomyelitis ([place]) 02/11/2021   Abscess of right lower extremity excluding foot 10/18/2020   Abscess of right leg 10/18/2020   Dehydration 10/03/2020   Leukocytosis 10/03/2020   Thrombocytosis 10/03/2020   Hyponatremia 10/03/2020   Hypochloremia 10/03/2020   Failure to thrive in adult 10/03/2020   Acute renal failure superimposed on stage 3b chronic kidney disease ([place]) 10/03/2020   Uncontrolled type 2 diabetes mellitus with hyperglycemia, without long-term current use of insulin ([place]) 10/03/2020   Acute kidney injury superimposed on CKD ([place]) 10/02/2020   Chronic kidney disease 10/01/2020   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[place]) 05/12/2020   Chronic insomnia 01/08/2020   Ocular myasthenia gravis ([place]) 09/18/2019   Peripheral edema 01/23/2019   Asthma 05/14/2015   Heel spur 02/24/2015   Depression 02/24/2015   Post-menopausal bleeding 06/02/2014   OA (osteoarthritis) of knee 04/09/2014   Epidermal cyst 10/01/2013   Class 3 obesity ([place]) 02/25/2013   Back pain 03/04/2012   Gout 01/25/2012   Edema 01/28/2011   Obesity hypoventilation syndrome ([place]) 06/16/2008   OBSTRUCTIVE SLEEP APNEA 05/08/2008   HLD (hyperlipidemia) 08/28/2006   Anxiety 08/28/2006   Essential hypertension 08/28/2006   GERD 08/28/2006   Osteoarthritis 08/28/2006   Thank you,  Genene Churn, CCC-SLP [phone]  Genene Churn, [place] 03/17/2022, 1:29 PM  [facility] [address] [place], [place], [address] Phone: [phone]   Fax:  [phone]  Name: [name] MRN: [id number] Date of Birth: 27-Nov-1950

## 2022-03-30 ENCOUNTER — Telehealth: Payer: Self-pay | Admitting: Pulmonary Disease

## 2022-05-02 NOTE — Telephone Encounter (Signed)
Scheduled for 05/11/2022

## 2022-05-11 ENCOUNTER — Ambulatory Visit: Payer: Medicare Other

## 2022-05-11 DIAGNOSIS — G4733 Obstructive sleep apnea (adult) (pediatric): Secondary | ICD-10-CM

## 2022-08-19 ENCOUNTER — Telehealth: Payer: Self-pay | Admitting: Pulmonary Disease

## 2022-08-25 ENCOUNTER — Other Ambulatory Visit (HOSPITAL_COMMUNITY): Payer: Self-pay | Admitting: Family Medicine

## 2022-08-25 DIAGNOSIS — Z1231 Encounter for screening mammogram for malignant neoplasm of breast: Secondary | ICD-10-CM

## 2022-08-29 ENCOUNTER — Telehealth: Payer: Self-pay | Admitting: *Deleted

## 2022-08-29 DIAGNOSIS — G4733 Obstructive sleep apnea (adult) (pediatric): Secondary | ICD-10-CM

## 2022-08-29 NOTE — Telephone Encounter (Signed)
Not sure what happened with routing of her sleep study from October.  I have reviewed this and it shows moderate obstructive sleep apnea.  Please send an order to get her set up auto CPAP 5 to 20 cm H2O.  Please arrange for overnight oximetry with auto CPAP.  Depending on results of this will determine if she needs additional testing in the sleep lab.  Please schedule ROV with me in 4 months.   HST 05/11/22 >> AHI 22.1, SpO2 low 66%.  Spent 177.6 min with SpO2 < 89%

## 2022-08-29 NOTE — Telephone Encounter (Signed)
Called and spoke with patient.  She states she had sleep study done in October and never received a call with her results.  On pts appt desk it is confirmed that she was here for HST in October.  Upon further investigation, I did not see a result note from Dr. Halford Chessman or a scanned HST document in patients chart   Dr. Halford Chessman please advise, did you receive patients results? Will also add [place] and [facility].

## 2022-08-29 NOTE — Telephone Encounter (Signed)
To add to last note: Patient states she is having a very hard time without a CPAP machine. She is using O2 at night time but does not feel well rested and feels like she cannot breathe at night time.

## 2022-08-30 NOTE — Telephone Encounter (Signed)
Called and notified patient.  She voiced understanding of message from Dr. Halford Chessman.  CPAP and ONO ordered.  Nothing further needed at this time.

## 2022-09-05 ENCOUNTER — Telehealth: Payer: Self-pay | Admitting: Pulmonary Disease

## 2022-09-05 NOTE — Telephone Encounter (Signed)
PCC's please advise.  

## 2022-09-07 NOTE — Telephone Encounter (Signed)
Faxed to [facility]. Will update patient

## 2022-09-09 ENCOUNTER — Telehealth: Payer: Self-pay | Admitting: *Deleted

## 2022-09-09 NOTE — Telephone Encounter (Signed)
Called pt. And followed up with pt. And gave #'s for [name] and [facility] now that the orders are in

## 2022-09-09 NOTE — Telephone Encounter (Signed)
Message sent to [facility] to get patient her CPAP.

## 2022-09-15 ENCOUNTER — Ambulatory Visit (INDEPENDENT_AMBULATORY_CARE_PROVIDER_SITE_OTHER): Payer: 59 | Admitting: Pulmonary Disease

## 2022-09-15 ENCOUNTER — Encounter: Payer: Self-pay | Admitting: Pulmonary Disease

## 2022-09-15 VITALS — BP 134/84 | HR 78 | Ht 65.0 in | Wt 224.0 lb

## 2022-09-15 DIAGNOSIS — G4733 Obstructive sleep apnea (adult) (pediatric): Secondary | ICD-10-CM

## 2022-09-15 DIAGNOSIS — G4734 Idiopathic sleep related nonobstructive alveolar hypoventilation: Secondary | ICD-10-CM | POA: Diagnosis not present

## 2022-09-15 DIAGNOSIS — Z7189 Other specified counseling: Secondary | ICD-10-CM | POA: Diagnosis not present

## 2022-09-15 NOTE — Patient Instructions (Signed)
Will make sure [name] gets your auto CPAP set up  Follow up in 3 to 4 months

## 2022-09-15 NOTE — Progress Notes (Signed)
[name]  Chief Complaint  Patient presents with   Follow-up    [name] told patient she needs an ov with Dr. Halford Chessman before she can get CPAP since last ov was in July  Patient has questions about what will happen after CPAP     Past Surgical History:  She  has a past surgical history that includes Tubal ligation; Colonoscopy (N/A, 12/25/2013); Cataract extraction w/PHACO (Left, 09/17/2015); Cyst excision (N/A, 09/12/2016); Cataract extraction w/PHACO (Right, 05/15/2017); Intraocular lens insertion (Bilateral, 2018); Colonoscopy with propofol (N/A, 10/04/2019); polypectomy (10/04/2019); ABDOMINAL AORTOGRAM W/LOWER EXTREMITY (N/A, 02/15/2021); PERIPHERAL VASCULAR BALLOON ANGIOPLASTY (Left, 02/15/2021); and PERIPHERAL VASCULAR INTERVENTION (Left, 02/15/2021).  Past Medical History:  OA, HLD, HTN, Myasthenia gravis, Gout, GERD, Depression, Anxiety, Pancreatitis July 2022  Constitutional:  BP 134/84   Pulse 78   Ht 5' 5"$  (1.651 m)   Wt 224 lb (101.6 kg)   SpO2 94% Comment: ra  BMI 37.28 kg/m   Brief Summary:  [name] is a 72 y.o. female former smoker with asthma, obstructive sleep apnea, and obesity hypoventilation syndrome      Subjective:   Her weight is up since last visit.  Sleep has gotten worse.  She is having more snoring, and sleep disruption.  More sleepy and tired during the day.  Home sleep study from October showed moderate obstructive sleep apnea.   Physical Exam:   Appearance - well kempt, uses a cane  ENMT - no sinus tenderness, no oral exudate, no LAN, Mallampati 4 airway, no stridor, poor dentition  Respiratory - equal breath sounds bilaterally, no wheezing or rales  CV - s1s2 regular rate and rhythm, no murmurs  Ext - no clubbing, no edema  Skin - no rashes  Psych - normal mood and affect     Pulmonary testing:  PFT 02/22/16 >> FEV1 1.31 (65%), FEV1% 86, DLCO 79%  Sleep Tests:  PSG 05/15/05 >> AHI  34 Auto CPAP 08/05/18 to 09/03/18 >> used on 30 of 30 nights with average 8 hrs 14 min.  Average AHI 1.8 with median CPAP 7 and 95 th percentile CPAP 10 cm H2O. ONO with 1 liter 01/15/22 >> test time 11 hrs 34 min. Basal SpO2 86.7%, low SpO2 50%. Spent 4 hrs 22 min of test time with SpO2 < 88%.  HST 05/11/22 >> AHI 22.1, SpO2 low 66%.  Spent 177.6 min with SpO2 < 89%.  Cardiac Tests:  Echo 07/16/13 >> EF 65 to 70%  Social History:  She  reports that she quit smoking about 17 years ago. Her smoking use included cigarettes. She started smoking about 53 years ago. She has a 72.00 pack-year smoking history. She has never used smokeless tobacco. She reports that she does not drink alcohol and does not use drugs.  Family History:  Her family history includes Crohn's disease in her daughter; Diabetes in her brother; Heart disease in her mother; Heart failure in her father; Lung disease in her father; Thyroid disease in her mother.     Assessment/Plan:    Obstructive sleep apnea with nocturnal hypoxemia. - likely recurred with weight gain - sleep study reviewed - discussed how sleep apnea can impact her health - treatment options discussed - will arrange for auto CPAP set up - once she is established on auto CPAP, will then arrange for overnight oximetry to determine if she needs an in lab titration study   Mild, intermittent asthma. - prn albuterol   Myasthenia  gravis with ocular involvement. - followed by Dr. Leta Baptist with neurology  Time Spent Involved in Patient Care on Day of Examination:  27 minutes  Follow up:   Patient Instructions  Will make sure [name] gets your auto CPAP set up  Follow up in 3 to 4 months  Medication List:   Allergies as of 09/15/2022   No Known Allergies      Medication List        Accurate as of September 15, 2022 10:44 AM. If you have any questions, ask your nurse or doctor.          albuterol 108 (90 Base) MCG/ACT inhaler Commonly known  as: VENTOLIN HFA Inhale 2 puffs into the lungs every 4 (four) hours as needed for wheezing or shortness of breath.   allopurinol 100 MG tablet Commonly known as: ZYLOPRIM TAKE 1 TABLET(100 MG) BY MOUTH DAILY   aspirin EC 81 MG tablet Take 1 tablet (81 mg total) by mouth every evening.   atorvastatin 40 MG tablet Commonly known as: LIPITOR TAKE 1 TABLET(40 MG) BY MOUTH DAILY AT 6 PM   busPIRone 5 MG tablet Commonly known as: BUSPAR TAKE 1 TABLET(5 MG) BY MOUTH TWICE DAILY FOR ANXIETY   clopidogrel 75 MG tablet Commonly known as: PLAVIX Take 75 mg by mouth daily.   colchicine 0.6 MG tablet Take 1 tablet (0.6 mg total) by mouth daily.   collagenase 250 UNIT/GM ointment Commonly known as: SANTYL Apply topically 2 (two) times daily.   feeding supplement Liqd Take 237 mLs by mouth 2 (two) times daily between meals.   feeding supplement Liqd Take 237 mLs by mouth 3 (three) times daily between meals.   folic acid 1 MG tablet Commonly known as: FOLVITE Take 1 tablet (1 mg total) by mouth daily.   furosemide 40 MG tablet Commonly known as: LASIX TAKE 1 TABLET(40 MG) BY MOUTH DAILY   guaiFENesin-dextromethorphan 100-10 MG/5ML syrup Commonly known as: ROBITUSSIN DM Take 10 mLs by mouth every 4 (four) hours as needed for cough.   HYDROcodone-acetaminophen 7.5-325 MG tablet Commonly known as: NORCO SMARTSIG:1 Tablet(s) By Mouth 1-2 Times Daily   Magnesium 250 MG Tabs Take 1 tablet (250 mg total) by mouth daily.   multivitamin with minerals Tabs tablet Take 1 tablet by mouth daily.   omeprazole 40 MG capsule Commonly known as: PRILOSEC TAKE 1 CAPSULE(40 MG) BY MOUTH DAILY   OXYGEN Inhale 1 L into the lungs at bedtime. IN ADDITION TO CPAP NIGHTLY   polyethylene glycol powder 17 GM/SCOOP powder Commonly known as: MiraLax Mix 1 scoop (17 grams) of powder in water daily to help with constipation.   potassium chloride 10 MEQ tablet Commonly known as: KLOR-CON TAKE 1  TABLET BY MOUTH DAILY WITH FLUID PILL   pyridostigmine 60 MG tablet Commonly known as: MESTINON Take 1 tablet (60 mg total) by mouth 3 (three) times daily.   traZODone 100 MG tablet Commonly known as: DESYREL TAKE 1 TABLET BY MOUTH AT BEDTIME AS NEEDED FOR SLEEP        Signature:  Chesley Mires, MD [place] Pager - [phone] 09/15/2022, 10:44 AM

## 2022-09-28 ENCOUNTER — Telehealth: Payer: Self-pay | Admitting: *Deleted

## 2022-09-28 DIAGNOSIS — G4733 Obstructive sleep apnea (adult) (pediatric): Secondary | ICD-10-CM

## 2022-09-28 NOTE — Telephone Encounter (Signed)
Called and went over confirmation from Dr. Halford Chessman with patient. Nothing further needed at this time.

## 2022-09-28 NOTE — Telephone Encounter (Signed)
She should start using CPAP without supplemental oxygen.  Will get overnight oximetry with CPAP and then determine if she needs additional sleep testing and decide if supplemental oxygen is needed with CPAP.

## 2022-09-28 NOTE — Telephone Encounter (Signed)
Called and spoke to patient.  She states that she received CPAP from [facility] and that there is not a hook up for her o2 included.  She wanted to make sure she could sleep with CPAP tonight without the O2.  She also asked about ONO order.  It looks like from last note, we were going to order a ONO to be done with patient on CPAP/ra.    Dr. Halford Chessman please advise, can you verify both of these?  Thanks!

## 2022-10-03 ENCOUNTER — Ambulatory Visit (HOSPITAL_COMMUNITY)
Admission: RE | Admit: 2022-10-03 | Discharge: 2022-10-03 | Disposition: A | Discharge status: Home / Self Care | Payer: 59 | Source: Ambulatory Visit | Attending: Family Medicine | Admitting: Family Medicine

## 2022-10-03 DIAGNOSIS — Z1231 Encounter for screening mammogram for malignant neoplasm of breast: Secondary | ICD-10-CM | POA: Diagnosis not present

## 2022-11-17 ENCOUNTER — Telehealth: Payer: Self-pay | Admitting: Pulmonary Disease

## 2022-11-17 NOTE — Telephone Encounter (Signed)
Pt called the office stating that she is trying to go back to school and states she needs a letter stating that this is sustained for gameful activity.  Pt again stated that this is for school, not for work. The schooling that she is going to be doing is online.  Dr. Craige Cotta, please advise if you are okay with this letter being written.

## 2022-11-17 NOTE — Telephone Encounter (Signed)
Okay to send letter.

## 2022-11-21 ENCOUNTER — Telehealth: Payer: Self-pay | Admitting: *Deleted

## 2022-11-21 NOTE — Telephone Encounter (Signed)
[facility] pt unable to LVM - please route to my ext if she returns call. [phone]

## 2022-11-21 NOTE — Telephone Encounter (Signed)
Documented in error.

## 2022-11-21 NOTE — Telephone Encounter (Signed)
Patient returned call and states that she would like the letter to say   "[name] is sustained for gameful activity"   Please call patient and advise. She is not active on mychart. [phone]

## 2022-11-22 NOTE — Telephone Encounter (Signed)
Note has been printed for pt p/u she is aware.

## 2022-11-28 ENCOUNTER — Telehealth: Payer: Self-pay | Admitting: *Deleted

## 2022-11-28 NOTE — Telephone Encounter (Signed)
Patient would like a note for patient to attend school that the student is "able to engage in substantial gainful activity" and needs it to be signed by Dr. Craige Cotta. Please call when letter is ready for pick up

## 2022-11-29 ENCOUNTER — Other Ambulatory Visit: Payer: Self-pay

## 2022-11-29 NOTE — Progress Notes (Signed)
Opened encounter to create a communication for pt. NFN

## 2022-11-29 NOTE — Telephone Encounter (Signed)
ATC pt LVM letting her know that I have printed her letter for school and it is available to p/u

## 2022-12-13 ENCOUNTER — Ambulatory Visit (INDEPENDENT_AMBULATORY_CARE_PROVIDER_SITE_OTHER): Payer: 59 | Admitting: Diagnostic Neuroimaging

## 2022-12-13 ENCOUNTER — Encounter: Payer: Self-pay | Admitting: Diagnostic Neuroimaging

## 2022-12-13 VITALS — BP 208/86 | HR 88 | Ht 65.0 in | Wt 225.0 lb

## 2022-12-13 DIAGNOSIS — G7000 Myasthenia gravis without (acute) exacerbation: Secondary | ICD-10-CM | POA: Diagnosis not present

## 2022-12-13 MED ORDER — PYRIDOSTIGMINE BROMIDE 60 MG PO TABS: 60.0000 mg | ORAL_TABLET | Freq: Three times a day (TID) | ORAL | 4 refills | Status: DC

## 2022-12-13 NOTE — Progress Notes (Addendum)
[facility]  PATIENT: [name] DOB: 12-06-1950  REFERRING CLINICIAN: John Giovanni, MD HISTORY FROM: patient  REASON FOR VISIT: follow up   HISTORICAL  CHIEF COMPLAINT:  Chief Complaint  Patient presents with   Follow-up    Patient in room #7 and alone. Patient states she has sum weakness and ain that travels down t the right side of her neck, arm and both legs.    HISTORY OF PRESENT ILLNESS:   UPDATE (12/13/22, VRP): Since last visit, doing about the same. Overall stable vision, occ double vision. Tolerating meds. No general symptoms.  UPDATE (03/07/22, VRP): Since last visit, doing well. Some double vision intermittently. No gen weakness or major speech issues. Rare swallow issues.  UPDATE (12/07/21, VRP): Since last visit, doing well. Now off prednisone and leg sores have healed. Some left ptosis intermittently. No double vision.   UPDATE (12/01/20, VRP): Since last visit, ocular MG is better. On prednisone and pyridostigmine. Having more issues with wounds and leg edema.   UPDATE (04/01/20, VRP): Since last visit, continues with ptosis and double vision. Symptoms are progressive. Severity is moderate. No alleviating or aggravating factors. Tolerating meds.  UPDATE (01/21/20, VRP): Since last visit, doing well. Symptoms are stable. Severity is mild. No alleviating or aggravating factors. Tolerating pyridostigmine.    UPDATE (09/17/19, VRP): Since last visit, doing worse with left eye ptosis and double vision. Symptoms are progressive. No breathing issues. Some gen weakness. Tolerating pyridostigmine 60mg  three times a day.    UPDATE (02/06/19, VRP): Since last visit, doing about the same. Symptoms are stable. Severity is mild. No alleviating or aggravating factors. Tolerating pyridostigmine. Mild intermittent left ptosis. No SOB, chewing or swallowing issues.   UPDATE (01/02/18, VRP): Since last visit, doing well. Tolerating meds. No alleviating or  aggravating factors. No SOB, swallow diff, speech diff. Vision stable.   UPDATE (07/05/17, VRP): Since last visit, doing well. Tolerating pyridostigmine (60mg  three times a day). No alleviating or aggravating factors. Eyes are better. Has some flare of sxs every month (2x per month; few hrs each time). No breathing, swallowing or speaking problems. CT chest reviewed with patient.   PRIOR HPI (03/22/17): 72 year old female with hypertension, hypercholesterolemia, anxiety, here for evaluation of double vision. 6 weeks ago patient had sudden onset of redness in both eyes. She then developed double vision and left eyelid drooping. She went to eye doctor, then referred to emergency room for MRI testing to rule out stroke. MRI of the brain and orbits were negative for acute process. Patient then had myasthenia gravis antibody panel testing which was notable for borderline positive anti-striation autoantibodies. Patient for here for further evaluation and management. Patient also reports a number of other symptoms including fatigue, swelling in legs, wheezing, headache, numbness, weakness, slurred speech. However these symptoms do not correlate with the recent 6 weeks of double vision and left eyelid weakness. Patient has been using an eye patch due to her double vision. Symptoms have been fairly consistent since onset. However later in her conversation patient states that symptoms fluctuate and are worse in the evening.   REVIEW OF SYSTEMS: Full 14 system review of systems performed and negative with exception of: as per HPI.    ALLERGIES: No Known Allergies  HOME MEDICATIONS: Outpatient Medications Prior to Visit  Medication Sig Dispense Refill   albuterol (VENTOLIN HFA) 108 (90 Base) MCG/ACT inhaler Inhale 2 puffs into the lungs every 4 (four) hours as needed for wheezing or shortness of breath. 8.5  g 2   allopurinol (ZYLOPRIM) 100 MG tablet TAKE 1 TABLET(100 MG) BY MOUTH DAILY 90 tablet 3   aspirin EC  81 MG tablet Take 1 tablet (81 mg total) by mouth every evening.     atorvastatin (LIPITOR) 40 MG tablet TAKE 1 TABLET(40 MG) BY MOUTH DAILY AT 6 PM 90 tablet 1   busPIRone (BUSPAR) 5 MG tablet TAKE 1 TABLET(5 MG) BY MOUTH TWICE DAILY FOR ANXIETY 60 tablet 0   clopidogrel (PLAVIX) 75 MG tablet Take 75 mg by mouth daily.     colchicine 0.6 MG tablet Take 1 tablet (0.6 mg total) by mouth daily. 7 tablet 0   collagenase (SANTYL) ointment Apply topically 2 (two) times daily. 15 g 0   feeding supplement (ENSURE ENLIVE / ENSURE PLUS) LIQD Take 237 mLs by mouth 2 (two) times daily between meals. 237 mL 12   feeding supplement (ENSURE ENLIVE / ENSURE PLUS) LIQD Take 237 mLs by mouth 3 (three) times daily between meals. 237 mL 12   folic acid (FOLVITE) 1 MG tablet Take 1 tablet (1 mg total) by mouth daily.     furosemide (LASIX) 40 MG tablet TAKE 1 TABLET(40 MG) BY MOUTH DAILY 90 tablet 3   guaiFENesin-dextromethorphan (ROBITUSSIN DM) 100-10 MG/5ML syrup Take 10 mLs by mouth every 4 (four) hours as needed for cough. 118 mL 0   HYDROcodone-acetaminophen (NORCO) 7.5-325 MG tablet SMARTSIG:1 Tablet(s) By Mouth 1-2 Times Daily     loperamide (IMODIUM) 2 MG capsule Take 2 mg by mouth 3 (three) times daily.     Magnesium 250 MG TABS Take 1 tablet (250 mg total) by mouth daily. 30 tablet 11   Multiple Vitamin (MULTIVITAMIN WITH MINERALS) TABS tablet Take 1 tablet by mouth daily.     omeprazole (PRILOSEC) 40 MG capsule TAKE 1 CAPSULE(40 MG) BY MOUTH DAILY 90 capsule 3   OXYGEN Inhale 1 L into the lungs at bedtime. IN ADDITION TO CPAP NIGHTLY     polyethylene glycol powder (MIRALAX) 17 GM/SCOOP powder Mix 1 scoop (17 grams) of powder in water daily to help with constipation. 255 g 0   potassium chloride (KLOR-CON) 10 MEQ tablet TAKE 1 TABLET BY MOUTH DAILY WITH FLUID PILL 90 tablet 3   traZODone (DESYREL) 100 MG tablet TAKE 1 TABLET BY MOUTH AT BEDTIME AS NEEDED FOR SLEEP 90 tablet 2   pyridostigmine (MESTINON) 60  MG tablet Take 1 tablet (60 mg total) by mouth 3 (three) times daily. 270 tablet 4   predniSONE (DELTASONE) 10 MG tablet Take 10 mg by mouth daily with breakfast. (Patient not taking: Reported on 12/13/2022)     No facility-administered medications prior to visit.    PAST MEDICAL HISTORY: Past Medical History:  Diagnosis Date   Achilles tendinitis    Anxiety    Asthma    Depression    GERD (gastroesophageal reflux disease)    Gout    H/O myasthenia gravis    left eye   HTN (hypertension)    Hyperlipidemia    Low back pain    Obesity hypoventilation syndrome (HCC)    Obstructive sleep apnea    Osteoarthritis     PAST SURGICAL HISTORY: Past Surgical History:  Procedure Laterality Date   ABDOMINAL AORTOGRAM W/LOWER EXTREMITY N/A 02/15/2021   Procedure: ABDOMINAL AORTOGRAM W/LOWER EXTREMITY;  Surgeon: Maeola Harman, MD;  [facility] INVASIVE CV LAB;  Service: Cardiovascular;  Laterality: N/A;   CATARACT EXTRACTION W/PHACO Left 09/17/2015   Procedure: CATARACT EXTRACTION PHACO  AND INTRAOCULAR LENS PLACEMENT LEFT EYE cde=8.58;  Surgeon: Gemma Payor, MD;  Location: [facility];  Service: Ophthalmology;  Laterality: Left;   CATARACT EXTRACTION W/PHACO Right 05/15/2017   Procedure: CATARACT EXTRACTION PHACO AND INTRAOCULAR LENS PLACEMENT (IOC);  Surgeon: Gemma Payor, MD;  Location: [facility];  Service: Ophthalmology;  Laterality: Right;  CDE: 6.34   COLONOSCOPY N/A 12/25/2013   Procedure: COLONOSCOPY;  Surgeon: Malissa Hippo, MD;  Location: [facility] END[facility] SU[facility];  Service: Endoscopy;  Laterality: N/A;  730-rescheduled Ann notified pt   COLONOSCOPY WITH PROPOFOL N/A 10/04/2019   Procedure: COLONOSCOPY WITH PROPOFOL;  Surgeon: Malissa Hippo, MD;  Location: [facility];  Service: Endoscopy;  Laterality: N/A;  815   CYST EXCISION N/A 09/12/2016   Procedure: EXCISION SEBACEOUS CYST, BACK;  Surgeon: Franky Macho, MD;  Location: [facility];  Service: General;  Laterality: N/A;   INTRAOCULAR LENS  INSERTION Bilateral 2018   Dr. Gemma Payor    PERIPHERAL VASCULAR BALLOON ANGIOPLASTY Left 02/15/2021   Procedure: PERIPHERAL VASCULAR BALLOON ANGIOPLASTY;  Surgeon: Maeola Harman, MD;  Location: [facility] INVASIVE CV LAB;  Service: Cardiovascular;  Laterality: Left;  tp trunk   PERIPHERAL VASCULAR INTERVENTION Left 02/15/2021   Procedure: PERIPHERAL VASCULAR INTERVENTION;  Surgeon: Maeola Harman, MD;  Location: [facility] INVASIVE CV LAB;  Service: Cardiovascular;  Laterality: Left;  SFA   POLYPECTOMY  10/04/2019   Procedure: POLYPECTOMY;  Surgeon: Malissa Hippo, MD;  Location: [facility];  Service: Endoscopy;;   TUBAL LIGATION      FAMILY HISTORY: Family History  Problem Relation Age of Onset   Heart disease Mother    Thyroid disease Mother    Heart failure Father    Lung disease Father    Diabetes Brother    Crohn's disease Daughter     SOCIAL HISTORY:  Social History   Socioeconomic History   Marital status: Widowed    Spouse name: Not on file   Number of children: 3   Years of education: 14   Highest education level: Not on file  Occupational History   Occupation: disabled    Employer: UNEMPLOYED  Tobacco Use   Smoking status: Former    Packs/day: 2.00    Years: 36.00    Additional pack years: 0.00    Total pack years: 72.00    Types: Cigarettes    Start date: 06/25/1969    Quit date: 03/25/2005    Years since quitting: 17.7   Smokeless tobacco: Never  Vaping Use   Vaping Use: Never used  Substance and Sexual Activity   Alcohol use: No    Alcohol/week: 0.0 standard drinks of alcohol   Drug use: No   Sexual activity: Not Currently    Birth control/protection: Post-menopausal  Other Topics Concern   Not on file  Social History Narrative   Originally from [place]. She has always lived in [place]. 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 No pets currently. No bird, mold, or hot tub exposure.    Lives alone   No caffeine    Social Determinants of Health   Financial Resource Strain: Not on file  Food Insecurity: Not on file  Transportation Needs: Not on file  Physical Activity: Not on file  Stress: Not on file  Social Connections: Not on file  Intimate Partner Violence: Not on file     PHYSICAL EXAM  GENERAL EXAM/CONSTITUTIONAL: Vitals:  Vitals:   12/13/22 1320  BP: (!) 187/79  Pulse:  88  Weight: 225 lb (102.1 kg)  Height: 5\' 5"  (1.651 m)   Body mass index is 37.44 kg/m. No results found. Patient is in no distress; well developed, nourished and groomed; neck is supple  CARDIOVASCULAR: Examination of carotid arteries is normal; no carotid bruits Regular rate and rhythm, no murmurs Examination of peripheral vascular system by observation and palpation is normal  EYES: Ophthalmoscopic exam of optic discs and posterior segments is normal; no papilledema or hemorrhages  MUSCULOSKELETAL: Gait, strength, tone, movements noted in Neurologic exam below  NEUROLOGIC: MENTAL STATUS:      No data to display         awake, alert, oriented to person, place and time recent and remote memory intact normal attention and concentration language fluent, comprehension intact, naming intact,  fund of knowledge appropriate  CRANIAL NERVE:  2nd - no papilledema on fundoscopic exam 2nd, 3rd, 4th, 6th - pupils equal and reactive to light, visual fields full to confrontation, extraocular muscles intact, MILD LEFT PTOSIS; NO DOUBLE VISION 5th - facial sensation symmetric 7th - facial strength symmetric 8th - hearing intact 9th - palate elevates symmetrically, uvula midline 11th - shoulder shrug symmetric 12th - tongue protrusion midline  MOTOR:  normal bulk and tone, DIFFUSE 4/5 in the BUE, BLE  SENSORY:  normal and symmetric to light touch  COORDINATION:  finger-nose-finger, fine finger movements normal  REFLEXES:  deep tendon reflexes TRACE and symmetric  GAIT/STATION:  narrow based  gait    DIAGNOSTIC DATA (LABS, IMAGING, TESTING) - I reviewed patient records, labs, notes, testing and imaging myself where available.  Lab Results  Component Value Date   WBC 7.4 01/04/2022   HGB 11.0 (L) 01/04/2022   HCT 35.3 (L) 01/04/2022   MCV 94.9 01/04/2022   PLT 230 01/04/2022      Component Value Date/Time   NA 141 02/24/2021 0530   K 4.2 02/24/2021 0530   CL 110 02/24/2021 0530   CO2 26 02/24/2021 0530   GLUCOSE 72 02/24/2021 0530   BUN 13 02/24/2021 0530   CREATININE 0.97 02/24/2021 0530   CREATININE 4.23 (H) 10/01/2020 1204   CALCIUM 8.1 (L) 02/24/2021 0530   PROT 6.0 (L) 02/12/2021 0349   ALBUMIN 1.9 (L) 02/15/2021 0755   AST 27 02/12/2021 0349   ALT 17 02/12/2021 0349   ALKPHOS 74 02/12/2021 0349   BILITOT 0.6 02/12/2021 0349   GFRNONAA >60 02/24/2021 0530   GFRNONAA 10 (L) 10/01/2020 1204   GFRAA 11 (L) 10/01/2020 1204   Lab Results  Component Value Date   CHOL 140 01/08/2020   HDL 56 01/08/2020   LDLCALC 61 01/08/2020   LDLDIRECT 157 (H) 02/25/2013   TRIG 144 01/08/2020   CHOLHDL 2.5 01/08/2020   Lab Results  Component Value Date   HGBA1C 4.9 02/12/2021   Lab Results  Component Value Date   VITAMINB12 505 02/21/2021   Lab Results  Component Value Date   TSH 1.618 02/16/2021    02/24/17 MRI brain / orbits [I reviewed images myself and agree with interpretation. -VRP]  - No explanation for symptoms. No infarct, compressive lesion, or neuritis findings.  03/01/17 labs - anti AchR binding, blocking, modulation - normal - anti striational ab - 1:40 (h)  01/13/20      AChR Binding Ab, Serum 0.00 - 0.24 nmol/L 0.05    01/21/20      MuSK Antibodies U/mL <1.0    04/05/17 CT chest - No evidence of residual thymic tissue or  or thymoma. - No acute abnormality noted.     ASSESSMENT AND PLAN  72 y.o. year old female here with new onset of double vision left eyelid ptosis, with positive ice pack test in office, and slightly positive  anti-striation all antibody testing. Findings consistent with ocular myasthenia gravis.   Dx:  1. Ocular myasthenia (HCC)     PLAN:  MYASTHENIA GRAVIS (ocular)  - continue pyridostigmine to 60mg  three times a day   - off prednisone due to side effects (hyperglycemia, skin reaction)  - cautioned patient if she develops breathing, chewing, swallowing sxs; patient to contact us right away or go to ER if bulbar or generalized symptoms develop   HYPERTENSION  Vitals:   12/13/22 1320  BP: (!) 187/79  Pulse: 88   - follow up with PCP asap for BP medications   Meds ordered this encounter  Medications   pyridostigmine (MESTINON) 60 MG tablet    Sig: Take 1 tablet (60 mg total) by mouth 3 (three) times daily.    Dispense:  270 tablet    Refill:  4   Return in about 1 year (around 12/13/2023).    Suanne Marker, MD 12/13/2022, 2:00 PM Certified in Neurology, Neurophysiology and Neuroimaging  [facility] [address], [address] [place], [place] [address] [phone]

## 2022-12-13 NOTE — Patient Instructions (Signed)
  MYASTHENIA GRAVIS (ocular)  - continue pyridostigmine to 60mg  three times a day   - on low dose prednisone for musculoskeletal issues (per PCP)  - cautioned patient if she develops breathing, chewing, swallowing sxs; patient to contact us right away or go to ER if bulbar or generalized symptoms develop

## 2022-12-14 ENCOUNTER — Ambulatory Visit: Payer: Medicare Other | Admitting: Diagnostic Neuroimaging

## 2023-01-30 ENCOUNTER — Ambulatory Visit (INDEPENDENT_AMBULATORY_CARE_PROVIDER_SITE_OTHER): Payer: 59 | Admitting: Pulmonary Disease

## 2023-01-30 ENCOUNTER — Encounter: Payer: Self-pay | Admitting: Pulmonary Disease

## 2023-01-30 VITALS — BP 134/68 | HR 75 | Ht 65.0 in | Wt 226.8 lb

## 2023-01-30 DIAGNOSIS — J4520 Mild intermittent asthma, uncomplicated: Secondary | ICD-10-CM

## 2023-01-30 DIAGNOSIS — G4734 Idiopathic sleep related nonobstructive alveolar hypoventilation: Secondary | ICD-10-CM

## 2023-01-30 DIAGNOSIS — G4733 Obstructive sleep apnea (adult) (pediatric): Secondary | ICD-10-CM | POA: Diagnosis not present

## 2023-01-30 NOTE — Patient Instructions (Signed)
Will arrange for overnight oxygen test with CPAP  Follow up in 6 months 

## 2023-01-30 NOTE — Progress Notes (Signed)
[name]  Chief Complaint  Patient presents with   Follow-up     Past Surgical History:  She  has a past surgical history that includes Tubal ligation; Colonoscopy (N/A, 12/25/2013); Cataract extraction w/PHACO (Left, 09/17/2015); Cyst excision (N/A, 09/12/2016); Cataract extraction w/PHACO (Right, 05/15/2017); Intraocular lens insertion (Bilateral, 2018); Colonoscopy with propofol (N/A, 10/04/2019); polypectomy (10/04/2019); ABDOMINAL AORTOGRAM W/LOWER EXTREMITY (N/A, 02/15/2021); PERIPHERAL VASCULAR BALLOON ANGIOPLASTY (Left, 02/15/2021); and PERIPHERAL VASCULAR INTERVENTION (Left, 02/15/2021).  Past Medical History:  OA, HLD, HTN, Myasthenia gravis, Gout, GERD, Depression, Anxiety, Pancreatitis July 2022  Constitutional:  BP 134/68   Pulse 75   Ht 5\' 5"  (1.651 m)   Wt 226 lb 12.8 oz (102.9 kg)   SpO2 94%   BMI 37.74 kg/m   Brief Summary:  [name] is a 72 y.o. female former smoker with asthma, obstructive sleep apnea, and obesity hypoventilation syndrome      Subjective:   She will be getting married in December.    Uses CPAP.  No issues with mask fit.  Sleeping well.  Not having cough, wheeze, or sputum.   Physical Exam:   Appearance - well kempt   ENMT - no sinus tenderness, no oral exudate, no LAN, Mallampati 3 airway, no stridor  Respiratory - equal breath sounds bilaterally, no wheezing or rales  CV - s1s2 regular rate and rhythm, no murmurs  Ext - no clubbing, no edema  Skin - no rashes  Psych - normal mood and affect      Pulmonary testing:  PFT 02/22/16 >> FEV1 1.31 (65%), FEV1% 86, DLCO 79%  Sleep Tests:  PSG 05/15/05 >> AHI 34 ONO with 1 liter 01/15/22 >> test time 11 hrs 34 min. Basal SpO2 86.7%, low SpO2 50%. Spent 4 hrs 22 min of test time with SpO2 < 88%.  HST 05/11/22 >> AHI 22.1, SpO2 low 66%.  Spent 177.6 min with SpO2 < 89%. Auto CPAP 12/29/22 to 01/29/23 >> used on 21 of 30 nights with  average 5 hrs 28 min.  Average AHI 6.2 with median CPAP 9 and 95 th percentile CPAP 14 cm H2O  Cardiac Tests:  Echo 07/16/13 >> EF 65 to 70%  Social History:  She  reports that she quit smoking about 17 years ago. Her smoking use included cigarettes. She started smoking about 53 years ago. She has a 72.00 pack-year smoking history. She has never used smokeless tobacco. She reports that she does not drink alcohol and does not use drugs.  Family History:  Her family history includes Crohn's disease in her daughter; Diabetes in her brother; Heart disease in her mother; Heart failure in her father; Lung disease in her father; Thyroid disease in her mother.     Assessment/Plan:    Obstructive sleep apnea with nocturnal hypoxemia. - she is compliant with CPAP and reports benefit from therapy - she uses [name] for her DME - current CPAP ordered February 2024 - continue auto CPAP 5 to 15 cm H2O - will arrange for ONO with CPAP and then determine if she needs to resume using oxygen at night with CPAP   Mild, intermittent asthma. - prn albuterol   Myasthenia gravis with ocular involvement. - followed by Dr. Marjory Lies with neurology  Time Spent Involved in Patient Care on Day of Examination:  26 minutes  Follow up:   Patient Instructions  Will arrange for overnight oxygen test with CPAP  Follow up in 6 months  Medication  List:   Allergies as of 01/30/2023   No Known Allergies      Medication List        Accurate as of January 30, 2023 10:03 AM. If you have any questions, ask your nurse or doctor.          albuterol 108 (90 Base) MCG/ACT inhaler Commonly known as: VENTOLIN HFA Inhale 2 puffs into the lungs every 4 (four) hours as needed for wheezing or shortness of breath.   allopurinol 100 MG tablet Commonly known as: ZYLOPRIM TAKE 1 TABLET(100 MG) BY MOUTH DAILY   aspirin EC 81 MG tablet Take 1 tablet (81 mg total) by mouth every evening.   atorvastatin 40 MG  tablet Commonly known as: LIPITOR TAKE 1 TABLET(40 MG) BY MOUTH DAILY AT 6 PM   busPIRone 5 MG tablet Commonly known as: BUSPAR TAKE 1 TABLET(5 MG) BY MOUTH TWICE DAILY FOR ANXIETY   clopidogrel 75 MG tablet Commonly known as: PLAVIX Take 75 mg by mouth daily.   colchicine 0.6 MG tablet Take 1 tablet (0.6 mg total) by mouth daily.   collagenase 250 UNIT/GM ointment Commonly known as: SANTYL Apply topically 2 (two) times daily.   feeding supplement Liqd Take 237 mLs by mouth 2 (two) times daily between meals.   feeding supplement Liqd Take 237 mLs by mouth 3 (three) times daily between meals.   folic acid 1 MG tablet Commonly known as: FOLVITE Take 1 tablet (1 mg total) by mouth daily.   furosemide 40 MG tablet Commonly known as: LASIX TAKE 1 TABLET(40 MG) BY MOUTH DAILY   guaiFENesin-dextromethorphan 100-10 MG/5ML syrup Commonly known as: ROBITUSSIN DM Take 10 mLs by mouth every 4 (four) hours as needed for cough.   HYDROcodone-acetaminophen 7.5-325 MG tablet Commonly known as: NORCO SMARTSIG:1 Tablet(s) By Mouth 1-2 Times Daily   loperamide 2 MG capsule Commonly known as: IMODIUM Take 2 mg by mouth 3 (three) times daily.   Magnesium 250 MG Tabs Take 1 tablet (250 mg total) by mouth daily.   multivitamin with minerals Tabs tablet Take 1 tablet by mouth daily.   omeprazole 40 MG capsule Commonly known as: PRILOSEC TAKE 1 CAPSULE(40 MG) BY MOUTH DAILY   OXYGEN Inhale 1 L into the lungs at bedtime. IN ADDITION TO CPAP NIGHTLY   polyethylene glycol powder 17 GM/SCOOP powder Commonly known as: MiraLax Mix 1 scoop (17 grams) of powder in water daily to help with constipation.   potassium chloride 10 MEQ tablet Commonly known as: KLOR-CON TAKE 1 TABLET BY MOUTH DAILY WITH FLUID PILL   pyridostigmine 60 MG tablet Commonly known as: MESTINON Take 1 tablet (60 mg total) by mouth 3 (three) times daily.   traZODone 100 MG tablet Commonly known as:  DESYREL TAKE 1 TABLET BY MOUTH AT BEDTIME AS NEEDED FOR SLEEP        Signature:  Coralyn Helling, MD [facility] Pager - [phone] 01/30/2023, 10:03 AM

## 2023-10-06 ENCOUNTER — Encounter (HOSPITAL_COMMUNITY): Payer: Self-pay

## 2023-10-06 ENCOUNTER — Emergency Department (HOSPITAL_COMMUNITY)
Admission: EM | Admit: 2023-10-06 | Discharge: 2023-10-06 | Disposition: A | Discharge status: Home / Self Care | Attending: Emergency Medicine | Admitting: Emergency Medicine

## 2023-10-06 ENCOUNTER — Other Ambulatory Visit: Payer: Self-pay

## 2023-10-06 ENCOUNTER — Emergency Department (HOSPITAL_COMMUNITY)

## 2023-10-06 DIAGNOSIS — Z7982 Long term (current) use of aspirin: Secondary | ICD-10-CM | POA: Insufficient documentation

## 2023-10-06 DIAGNOSIS — M25552 Pain in left hip: Secondary | ICD-10-CM | POA: Insufficient documentation

## 2023-10-06 MED ORDER — OXYCODONE HCL 5 MG PO TABS
5.0000 mg | ORAL_TABLET | ORAL | Status: AC
  Administered 2023-10-06: 5 mg via ORAL
  Filled 2023-10-06: qty 1

## 2023-10-06 MED ORDER — OXYCODONE HCL 5 MG PO TABS: 5.0000 mg | ORAL_TABLET | Freq: Four times a day (QID) | ORAL | 0 refills | Status: AC | PRN

## 2023-10-06 NOTE — ED Provider Notes (Signed)
 [place] [facility] AT [facility] Provider Note   CSN: [id number] Arrival date & time: 10/06/23  1936     History  Chief Complaint  Patient presents with   Leg Pain    [name] is a 73 y.o. female.   Leg Pain  73 year old female, prior history of limb ischemia on the left, had a right common femoral artery cannulation, there was angioplasty and a stent of the left superficial femoral artery.  The patient was discharged with antiplatelets, the patient went to a rehab stay and has been discharged, she has been doing very well ever since that time but states that today she started having pain in the left upper hip, this is not in the leg itself but seems to be more in the left lateral hip around the anterior superior iliac wing.  There was no buttock pain, no numbness or weakness in the leg, it does hurt to walk on it, but she has not fallen recently.  She denies any change in color of the leg and has no pain or numbness or weakness going down the leg.    Home Medications Prior to Admission medications   Medication Sig Start Date End Date Taking? Authorizing Provider  oxyCODONE (ROXICODONE) 5 MG immediate release tablet Take 1 tablet (5 mg total) by mouth every 6 (six) hours as needed for severe pain (pain score 7-10). 10/06/23  Yes Eber Hong, MD  albuterol (VENTOLIN HFA) 108 (90 Base) MCG/ACT inhaler Inhale 2 puffs into the lungs every 4 (four) hours as needed for wheezing or shortness of breath. 05/26/21   Coralyn Helling, MD  allopurinol (ZYLOPRIM) 100 MG tablet TAKE 1 TABLET(100 MG) BY MOUTH DAILY 02/28/20   Salley Scarlet, MD  aspirin EC 81 MG tablet Take 1 tablet (81 mg total) by mouth every evening. 10/05/19   Malissa Hippo, MD  atorvastatin (LIPITOR) 40 MG tablet TAKE 1 TABLET(40 MG) BY MOUTH DAILY AT 6 PM 08/31/20   [place], Velna Hatchet, MD  busPIRone (BUSPAR) 5 MG tablet TAKE 1 TABLET(5 MG) BY MOUTH TWICE DAILY FOR ANXIETY 10/16/20   Salley Scarlet, MD  clopidogrel (PLAVIX) 75 MG tablet Take 75 mg by mouth daily. 04/16/21   [provider]  colchicine 0.6 MG tablet Take 1 tablet (0.6 mg total) by mouth daily. 02/24/21   Kathlen Mody, MD  collagenase (SANTYL) ointment Apply topically 2 (two) times daily. 02/23/21   Kathlen Mody, MD  feeding supplement (ENSURE ENLIVE / ENSURE PLUS) LIQD Take 237 mLs by mouth 2 (two) times daily between meals. 02/23/21   Kathlen Mody, MD  feeding supplement (ENSURE ENLIVE / ENSURE PLUS) LIQD Take 237 mLs by mouth 3 (three) times daily between meals. 02/24/21   Kathlen Mody, MD  folic acid (FOLVITE) 1 MG tablet Take 1 tablet (1 mg total) by mouth daily. 02/24/21   Kathlen Mody, MD  furosemide (LASIX) 40 MG tablet TAKE 1 TABLET(40 MG) BY MOUTH DAILY 09/08/20   [place], Velna Hatchet, MD  guaiFENesin-dextromethorphan [facility] DM) 100-10 MG/5ML syrup Take 10 mLs by mouth every 4 (four) hours as needed for cough. 02/23/21   Kathlen Mody, MD  HYDROcodone-acetaminophen (NORCO) 7.5-325 MG tablet SMARTSIG:1 Tablet(s) By Mouth 1-2 Times Daily 04/28/21   [provider]  loperamide (IMODIUM) 2 MG capsule Take 2 mg by mouth 3 (three) times daily. 11/09/22   [provider]  Magnesium 250 MG TABS Take 1 tablet (250 mg total) by mouth daily. 08/16/18  [place], Velna Hatchet, MD  Multiple Vitamin (MULTIVITAMIN WITH MINERALS) TABS tablet Take 1 tablet by mouth daily. 02/24/21   Kathlen Mody, MD  omeprazole (PRILOSEC) 40 MG capsule TAKE 1 CAPSULE(40 MG) BY MOUTH DAILY 06/29/20   Salley Scarlet, MD  OXYGEN Inhale 1 L into the lungs at bedtime. IN ADDITION TO CPAP NIGHTLY    [provider]  polyethylene glycol powder (MIRALAX) 17 GM/SCOOP powder Mix 1 scoop (17 grams) of powder in water daily to help with constipation. 11/08/20   Hyman Hopes, Margaux, PA-C  potassium chloride (KLOR-CON) 10 MEQ tablet TAKE 1 TABLET BY MOUTH DAILY WITH FLUID PILL 09/01/20   [place], Velna Hatchet, MD  pyridostigmine (MESTINON) 60 MG tablet  Take 1 tablet (60 mg total) by mouth 3 (three) times daily. 12/13/22   [place], Glenford Bayley, MD  traZODone (DESYREL) 100 MG tablet TAKE 1 TABLET BY MOUTH AT BEDTIME AS NEEDED FOR SLEEP 07/07/20   Salley Scarlet, MD      Allergies    Patient has no known allergies.    Review of Systems   Review of Systems  All other systems reviewed and are negative.   Physical Exam Updated Vital Signs BP (!) 162/56   Pulse 67   Temp 98 F (36.7 C)   Resp 18   Ht 1.651 m (5\' 5" )   Wt 93.9 kg   SpO2 96%   BMI 34.45 kg/m  Physical Exam Vitals and nursing note reviewed.  Constitutional:      General: She is not in acute distress.    Appearance: She is well-developed.  HENT:     Head: Normocephalic and atraumatic.     Mouth/Throat:     Pharynx: No oropharyngeal exudate.  Eyes:     General: No scleral icterus.       Right eye: No discharge.        Left eye: No discharge.     Conjunctiva/sclera: Conjunctivae normal.     Pupils: Pupils are equal, round, and reactive to light.  Neck:     Thyroid: No thyromegaly.     Vascular: No JVD.  Cardiovascular:     Rate and Rhythm: Normal rate and regular rhythm.     Heart sounds: Normal heart sounds. No murmur heard.    No friction rub. No gallop.  Pulmonary:     Effort: Pulmonary effort is normal. No respiratory distress.     Breath sounds: Normal breath sounds. No wheezing or rales.  Abdominal:     General: Bowel sounds are normal. There is no distension.     Palpations: Abdomen is soft. There is no mass.     Tenderness: There is no abdominal tenderness.  Musculoskeletal:        General: No tenderness. Normal range of motion.     Cervical back: Normal range of motion and neck supple.     Right lower leg: No edema.     Left lower leg: No edema.     Comments: Pulses are palpable at the femoral artery and dopplerable at the feet bilaterally, feet are symmetrical warm and perfused with capillary refill that is about 4 seconds bilaterally   Lymphadenopathy:     Cervical: No cervical adenopathy.  Skin:    General: Skin is warm and dry.     Findings: No erythema or rash.  Neurological:     General: No focal deficit present.     Mental Status: She is alert.     Coordination: Coordination normal.  Psychiatric:        Behavior: Behavior normal.     ED Results / Procedures / Treatments   Labs (all labs ordered are listed, but only abnormal results are displayed) Labs Reviewed - No data to display  EKG None  Radiology DG HIP UNILAT WITH PELVIS 2-3 VIEWS LEFT Result Date: 10/06/2023 CLINICAL DATA:  Stent placed in left leg for DVT prevention 2 years ago. Now having pain in left hip. EXAM: DG HIP (WITH OR WITHOUT PELVIS) 2-3V LEFT COMPARISON:  CT 02/15/2021 FINDINGS: No acute fracture or dislocation. Degenerative changes pubic symphysis, both hips, SI joints and lower lumbar spine. Calcified uterine fibroid. IMPRESSION: No acute fracture or dislocation. Electronically Signed   By: Minerva Fester M.D.   On: 10/06/2023 22:46    Procedures Procedures    Medications Ordered in ED Medications  oxyCODONE (Oxy IR/ROXICODONE) immediate release tablet 5 mg (has no administration in time range)    ED Course/ Medical Decision Making/ A&P Clinical Course as of 10/06/23 2256  Fri Oct 06, 2023  2155 Was able to find Doppler pulses at both the dorsalis pedis and posterior tibial pulses of both legs, they are symmetrical [BM]    Clinical Course User Index [BM] Eber Hong, MD                                 Medical Decision Making Amount and/or Complexity of Data Reviewed Radiology: ordered.  Risk Prescription drug management.    This patient presents to the ED for concern of left hip pain differential diagnosis includes fracture, dislocation, sprain, arthritis, inflammation, injury, shingles, vascular cause    Additional history obtained:  Additional history obtained from medical record External records from  outside source obtained and reviewed including prior vascular evaluation and stenting     Imaging Studies ordered:  I ordered imaging studies including x-ray of the left hip I independently visualized and interpreted imaging which showed no acute findings I agree with the radiologist interpretation   Medicines ordered and prescription drug management:  I ordered medication including oxycodone for pain Reevaluation of the patient after these medicines showed that the patient improved I have reviewed the patients home medicines and have made adjustments as needed   Problem List / ED Course:  I have again regarding to examine this patient and she has reproducible tenderness with movement of the left leg especially at the hip and the buttocks.  She has no redness overlying this area, no signs of shingles, no blistering lesions.  She has supple knee and ankle joints on that side and I have been able to get Doppler pulses of both feet which are symmetrical, capillary refill is symmetrical, she has no pain with palpation over her compartments and I do not think this is a compartment syndrome or a myositis or necrotizing fasciitis.  I have reviewed her vital signs which are unremarkable except for mild hypertension but she is not tachycardic or febrile.   Social Determinants of Health:  Prior vascular disease   At this time I feel the patient is stable for discharge, she will be given some pain medication for home and asked to follow-up with her primary doctor and orthopedist, she is agreeable to the plan           Final Clinical Impression(s) / ED Diagnoses Final diagnoses:  Left hip pain    Rx / DC Orders ED Discharge Orders  Ordered    oxyCODONE (ROXICODONE) 5 MG immediate release tablet  Every 6 hours PRN        10/06/23 2256              Eber Hong, MD 10/06/23 2256

## 2023-10-06 NOTE — ED Notes (Signed)
 Patient discharged. Provider spoke to patient. Paperwork given to patient and reviewed. Pt verbalized understanding. VSS. A+Ox4.

## 2023-10-06 NOTE — ED Triage Notes (Signed)
 Pt stated that she had a stent placed in her left leg for DVT prevention 2 years ago but is now having pain in her left hip. Triage nurse told her to come here for DVT evaluation.

## 2023-10-06 NOTE — Discharge Instructions (Addendum)
 Your x-ray shows no signs of severe problems, no broken bones and no dislocations.  You have good pulses to both of your feet so this does not appear to be a blocked artery.  I want you to follow-up with your family doctor on Monday.  I have given you a small supply of pain medicine  You may take 1 tablet of Percocet every 6 hours as needed but only for severe pain and be aware that this may cause nausea and constipation.  See your doctor on Monday, ER for worsening symptoms

## 2023-11-06 ENCOUNTER — Other Ambulatory Visit (HOSPITAL_COMMUNITY): Payer: Self-pay | Admitting: Internal Medicine

## 2023-11-06 DIAGNOSIS — Z1231 Encounter for screening mammogram for malignant neoplasm of breast: Secondary | ICD-10-CM

## 2023-11-09 ENCOUNTER — Other Ambulatory Visit: Payer: Self-pay | Admitting: *Deleted

## 2023-11-09 DIAGNOSIS — I739 Peripheral vascular disease, unspecified: Secondary | ICD-10-CM

## 2023-11-22 ENCOUNTER — Ambulatory Visit (HOSPITAL_COMMUNITY)

## 2023-11-22 ENCOUNTER — Telehealth: Payer: Self-pay | Admitting: Licensed Clinical Social Worker

## 2023-11-22 ENCOUNTER — Ambulatory Visit (HOSPITAL_COMMUNITY): Attending: Vascular Surgery | Admitting: Vascular Surgery

## 2023-11-22 NOTE — Telephone Encounter (Signed)
 H&V Care Navigation CSW Progress Note  Clinical Social Worker  received referral from [facility] team  to assist with transportation to get pt re-established with care. Pt unfortunately wasn't able to make it for her appt today in time. Will work with scheduling team to see if we are able to get her assistance when rescheduled.  Patient is participating in a Managed Medicaid Plan:  No, UHC Medicare and Medicaid  SDOH Screenings   Alcohol Screen: Low Risk  (05/12/2020)  Depression (PHQ2-9): Low Risk  (09/16/2020)  Tobacco Use: Medium Risk (10/06/2023)     Nathen Balder, MSW, LCSW Clinical Social Worker II [facility] Heart/Vascular Care Navigation  [phone]- work cell phone (preferred)

## 2023-11-24 ENCOUNTER — Telehealth: Payer: Self-pay | Admitting: Licensed Clinical Social Worker

## 2023-11-24 ENCOUNTER — Ambulatory Visit (HOSPITAL_COMMUNITY)

## 2023-11-24 NOTE — Telephone Encounter (Signed)
 H&V Care Navigation CSW Progress Note  Clinical Social Worker contacted patient by phone to f/u on patient assistance needed for transportation to rescheduled [facility] appt. No answer this afternoon at [phone]. Was able to leave voicemail. Will re-attempt again if no return call.  Patient is participating in a Managed Medicaid Plan:  No, UHC Medicare and Medicaid  SDOH Screenings   Alcohol Screen: Low Risk  (05/12/2020)  Depression (PHQ2-9): Low Risk  (09/16/2020)  Tobacco Use: Medium Risk (10/06/2023)     Nathen Balder, MSW, LCSW Clinical Social Worker II [facility] Heart/Vascular Care Navigation  [phone]- work cell phone (preferred)

## 2023-11-27 ENCOUNTER — Telehealth: Payer: Self-pay | Admitting: Licensed Clinical Social Worker

## 2023-11-27 NOTE — Telephone Encounter (Signed)
 H&V Care Navigation CSW Progress Note  Clinical Social Worker received a call back from pt- introduced self, role, reason for call. Pt shares she now lives at [address] [place], [place], [address]. She generally uses her insurance transportation but they were late and picked up another rider the other day which made her late to her appointment. We discussed options for upcoming appt. Pt encouraged to call her insurance plan and see if they are able to pick her up that early, if not then encouraged her to call me back and we could see what we could pull together for her.   Pt returned my call and let me know that she was able to get her ride sorted for that morning through insurance. She is interested in community resources for community transportation to get things like groceries etc. Since her son works 12 hour days he doesn't have time/"he's too tired" to help her get her medications and so she often uses friends or insurance transportation to use her [employer] benefits. Will f/u to ensures she gets those resources and encouraged her to consider a mail order pharmacy such as [facility] or any preferred pharmacy through her [employer].   Pt encouraged to call me as needed   Patient is participating in a Managed Medicaid Plan:  No, UHC Medicare and Medicaid   SDOH Screenings   Alcohol Screen: Low Risk  (05/12/2020)  Depression (PHQ2-9): Low Risk  (09/16/2020)  Tobacco Use: Medium Risk (10/06/2023)    Nathen Balder, MSW, LCSW Clinical Social Worker II [facility] Heart/Vascular Care Navigation  [phone]- work cell phone (preferred)

## 2023-11-27 NOTE — Telephone Encounter (Signed)
 H&V Care Navigation CSW Progress Note  Clinical Social Worker contacted patient by phone to f/u on patient assistance needed for transportation to rescheduled [facility] appt. No answer this morning again at [phone]. Was able to leave 2nd voicemail. Will re-attempt again if no return call.  Patient is participating in a Managed Medicaid Plan:  No, UHC Medicare and Medicaid  SDOH Screenings   Alcohol Screen: Low Risk  (05/12/2020)  Depression (PHQ2-9): Low Risk  (09/16/2020)  Tobacco Use: Medium Risk (10/06/2023)   Nathen Balder, MSW, LCSW Clinical Social Worker II [facility] Heart/Vascular Care Navigation  [phone]- work cell phone (preferred)

## 2023-11-28 ENCOUNTER — Encounter

## 2023-11-29 ENCOUNTER — Inpatient Hospital Stay (HOSPITAL_COMMUNITY): Admission: RE | Admit: 2023-11-29 | Source: Ambulatory Visit

## 2023-11-30 ENCOUNTER — Other Ambulatory Visit: Payer: Self-pay | Admitting: Vascular Surgery

## 2023-11-30 DIAGNOSIS — I739 Peripheral vascular disease, unspecified: Secondary | ICD-10-CM

## 2023-12-06 ENCOUNTER — Telehealth: Payer: Self-pay | Admitting: Diagnostic Neuroimaging

## 2023-12-06 NOTE — Telephone Encounter (Signed)
 Pt called  to confirm appt details .  Appt Confirmed

## 2023-12-08 ENCOUNTER — Ambulatory Visit (HOSPITAL_COMMUNITY)
Admission: RE | Admit: 2023-12-08 | Discharge: 2023-12-08 | Disposition: A | Discharge status: Home / Self Care | Source: Ambulatory Visit | Attending: Vascular Surgery | Admitting: Vascular Surgery

## 2023-12-08 ENCOUNTER — Ambulatory Visit (HOSPITAL_COMMUNITY)
Admission: RE | Admit: 2023-12-08 | Discharge: 2023-12-08 | Discharge status: Home / Self Care | Source: Ambulatory Visit | Attending: Vascular Surgery

## 2023-12-08 DIAGNOSIS — I739 Peripheral vascular disease, unspecified: Secondary | ICD-10-CM | POA: Insufficient documentation

## 2023-12-08 LAB — VAS US ABI WITH/WO TBI
Left ABI: 0.9
Right ABI: 0.97

## 2023-12-19 ENCOUNTER — Ambulatory Visit: Payer: 59 | Admitting: Diagnostic Neuroimaging

## 2023-12-20 ENCOUNTER — Encounter: Payer: Self-pay | Admitting: Diagnostic Neuroimaging

## 2023-12-20 ENCOUNTER — Other Ambulatory Visit: Payer: Self-pay | Admitting: Diagnostic Neuroimaging

## 2023-12-20 ENCOUNTER — Ambulatory Visit (INDEPENDENT_AMBULATORY_CARE_PROVIDER_SITE_OTHER): Admitting: Diagnostic Neuroimaging

## 2023-12-20 VITALS — BP 122/72 | HR 100 | Ht 65.0 in | Wt 203.0 lb

## 2023-12-20 DIAGNOSIS — G7000 Myasthenia gravis without (acute) exacerbation: Secondary | ICD-10-CM

## 2023-12-20 MED ORDER — PYRIDOSTIGMINE BROMIDE 60 MG PO TABS: 60.0000 mg | ORAL_TABLET | Freq: Three times a day (TID) | ORAL | 4 refills | Status: AC
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
## 2024-01-10 ENCOUNTER — Encounter: Payer: Self-pay | Admitting: Vascular Surgery

## 2024-01-10 ENCOUNTER — Ambulatory Visit: Attending: Vascular Surgery | Admitting: Vascular Surgery

## 2024-01-10 VITALS — BP 147/81 | HR 90 | Temp 97.9°F | Ht 65.0 in | Wt 205.0 lb

## 2024-01-10 DIAGNOSIS — I739 Peripheral vascular disease, unspecified: Secondary | ICD-10-CM | POA: Diagnosis not present

## 2024-01-10 NOTE — Progress Notes (Signed)
 Patient ID: [name], female   DOB: 11-19-1950, 73 y.o.   MRN: [id number]  Reason for Consult: New Patient (Initial Visit)   Referred by [name] Bacon, MD  Subjective:     HPI:  [name] is a 73 y.o. female has a history of left SFA stenting and left TP trunk angioplasty for ulceration of her left heel.  This has subsequently healed.  She now has left hip pain radiating down her her left buttock and lateral upper thigh.  She walks with the help of a walker.  She denies any frank claudication.  Denies any tissue loss or ulceration at this time.  She continues on aspirin , Plavix  and a statin and is a lifelong non-smoker.  Past Medical History:  Diagnosis Date   Achilles tendinitis    Anxiety    Asthma    Depression    GERD (gastroesophageal reflux disease)    Gout    H/O myasthenia gravis    left eye   HTN (hypertension)    Hyperlipidemia    Low back pain    Obesity hypoventilation syndrome (HCC)    Obstructive sleep apnea    Osteoarthritis    Family History  Problem Relation Age of Onset   Heart disease Mother    Thyroid disease Mother    Heart failure Father    Lung disease Father    Diabetes Brother    Crohn's disease Daughter    Past Surgical History:  Procedure Laterality Date   ABDOMINAL AORTOGRAM W/LOWER EXTREMITY N/A 02/15/2021   Procedure: ABDOMINAL AORTOGRAM W/LOWER EXTREMITY;  Surgeon: Adine Hoof, MD;  Location: [facility] INVASIVE CV LAB;  Service: Cardiovascular;  Laterality: N/A;   CATARACT EXTRACTION W/PHACO Left 09/17/2015   Procedure: CATARACT EXTRACTION PHACO AND INTRAOCULAR LENS PLACEMENT LEFT EYE cde=8.58;  Surgeon: Anner Kill, MD;  Location: [facility];  Service: Ophthalmology;  Laterality: Left;   CATARACT EXTRACTION W/PHACO Right 05/15/2017   Procedure: CATARACT EXTRACTION PHACO AND INTRAOCULAR LENS PLACEMENT (IOC);  Surgeon: Anner Kill, MD;  Location: [facility];  Service: Ophthalmology;  Laterality: Right;  CDE: 6.34   COLONOSCOPY N/A  12/25/2013   Procedure: COLONOSCOPY;  Surgeon: Ruby Corporal, MD;  Location: [facility];  Service: Endoscopy;  Laterality: N/A;  730-rescheduled Ann notified pt   COLONOSCOPY WITH PROPOFOL  N/A 10/04/2019   Procedure: COLONOSCOPY WITH PROPOFOL ;  Surgeon: Ruby Corporal, MD;  Location: [facility];  Service: Endoscopy;  Laterality: N/A;  815   CYST EXCISION N/A 09/12/2016   Procedure: EXCISION SEBACEOUS CYST, BACK;  Surgeon: Alanda Allegra, MD;  Location: [facility];  Service: General;  Laterality: N/A;   INTRAOCULAR LENS INSERTION Bilateral 2018   Dr. Anner Kill    PERIPHERAL VASCULAR BALLOON ANGIOPLASTY Left 02/15/2021   Procedure: PERIPHERAL VASCULAR BALLOON ANGIOPLASTY;  Surgeon: Adine Hoof, MD;  Location: [facility] INVASIVE CV LAB;  Service: Cardiovascular;  Laterality: Left;  tp trunk   PERIPHERAL VASCULAR INTERVENTION Left 02/15/2021   Procedure: PERIPHERAL VASCULAR INTERVENTION;  Surgeon: Adine Hoof, MD;  Location: [facility] INVASIVE CV LAB;  Service: Cardiovascular;  Laterality: Left;  SFA   POLYPECTOMY  10/04/2019   Procedure: POLYPECTOMY;  Surgeon: Ruby Corporal, MD;  Location: [facility];  Service: Endoscopy;;   TUBAL LIGATION      Short Social History:  Social History   Tobacco Use   Smoking status: Former    Current packs/day: 0.00    Average packs/day: 2.0 packs/day for 36.0 years (72.1  ttl pk-yrs)    Types: Cigarettes    Start date: 06/25/1969    Quit date: 03/25/2005    Years since quitting: 18.8   Smokeless tobacco: Never  Substance Use Topics   Alcohol use: No    Alcohol/week: 0.0 standard drinks of alcohol    No Known Allergies  Current Outpatient Medications  Medication Sig Dispense Refill   albuterol  (VENTOLIN  HFA) 108 (90 Base) MCG/ACT inhaler Inhale 2 puffs into the lungs every 4 (four) hours as needed for wheezing or shortness of breath. 8.5 g 2   allopurinol  (ZYLOPRIM ) 100 MG tablet TAKE 1 TABLET(100 MG) BY MOUTH DAILY 90 tablet 3    aspirin  EC 81 MG tablet Take 1 tablet (81 mg total) by mouth every evening.     atorvastatin  (LIPITOR) 40 MG tablet TAKE 1 TABLET(40 MG) BY MOUTH DAILY AT 6 PM 90 tablet 1   clopidogrel  (PLAVIX ) 75 MG tablet Take 75 mg by mouth daily.     colchicine  0.6 MG tablet Take 1 tablet (0.6 mg total) by mouth daily. 7 tablet 0   collagenase  (SANTYL ) ointment Apply topically 2 (two) times daily. 15 g 0   feeding supplement (ENSURE ENLIVE / ENSURE PLUS) LIQD Take 237 mLs by mouth 2 (two) times daily between meals. 237 mL 12   feeding supplement (ENSURE ENLIVE / ENSURE PLUS) LIQD Take 237 mLs by mouth 3 (three) times daily between meals. 237 mL 12   folic acid  (FOLVITE ) 1 MG tablet Take 1 tablet (1 mg total) by mouth daily.     furosemide  (LASIX ) 40 MG tablet TAKE 1 TABLET(40 MG) BY MOUTH DAILY 90 tablet 3   guaiFENesin -dextromethorphan  (ROBITUSSIN DM) 100-10 MG/5ML syrup Take 10 mLs by mouth every 4 (four) hours as needed for cough. 118 mL 0   HYDROcodone -acetaminophen  (NORCO) 7.5-325 MG tablet SMARTSIG:1 Tablet(s) By Mouth 1-2 Times Daily     loperamide (IMODIUM) 2 MG capsule Take 2 mg by mouth 3 (three) times daily.     Magnesium  250 MG TABS Take 1 tablet (250 mg total) by mouth daily. 30 tablet 11   Multiple Vitamin (MULTIVITAMIN WITH MINERALS) TABS tablet Take 1 tablet by mouth daily.     omeprazole  (PRILOSEC) 40 MG capsule TAKE 1 CAPSULE(40 MG) BY MOUTH DAILY 90 capsule 3   oxyCODONE  (ROXICODONE ) 5 MG immediate release tablet Take 1 tablet (5 mg total) by mouth every 6 (six) hours as needed for severe pain (pain score 7-10). 6 tablet 0   OXYGEN  Inhale 1 L into the lungs at bedtime. IN ADDITION TO CPAP NIGHTLY     polyethylene glycol powder (MIRALAX ) 17 GM/SCOOP powder Mix 1 scoop (17 grams) of powder in water  daily to help with constipation. 255 g 0   potassium chloride  (KLOR-CON ) 10 MEQ tablet TAKE 1 TABLET BY MOUTH DAILY WITH FLUID PILL 90 tablet 3   pyridostigmine  (MESTINON ) 60 MG tablet Take 1  tablet (60 mg total) by mouth 3 (three) times daily. 270 tablet 4   traZODone  (DESYREL ) 100 MG tablet TAKE 1 TABLET BY MOUTH AT BEDTIME AS NEEDED FOR SLEEP 90 tablet 2   No current facility-administered medications for this visit.    Review of Systems  Constitutional:  Constitutional negative. HENT: HENT negative.  Eyes: Eyes negative.  Cardiovascular: Cardiovascular negative.  GI: Gastrointestinal negative.  Musculoskeletal: Positive for gait problem and joint pain.  Hematologic: Hematologic/lymphatic negative.  Psychiatric: Psychiatric negative.        Objective:  Objective   Vitals:  01/10/24 1525  BP: (!) 147/81  Pulse: 90  Temp: 97.9 F (36.6 C)  SpO2: 92%  Weight: 205 lb (93 kg)  Height: 5' 5 (1.651 m)   Body mass index is 34.11 kg/m.  Physical Exam HENT:     Head: Normocephalic.     Nose: Nose normal.   Eyes:     Pupils: Pupils are equal, round, and reactive to light.    Cardiovascular:     Pulses:          Femoral pulses are 2+ on the right side and 2+ on the left side.      Popliteal pulses are 1+ on the left side.  Pulmonary:     Effort: Pulmonary effort is normal.  Abdominal:     General: Abdomen is flat.   Musculoskeletal:     Right lower leg: Edema present.     Left lower leg: Edema present.   Skin:    General: Skin is warm.     Capillary Refill: Capillary refill takes less than 2 seconds.   Neurological:     General: No focal deficit present.     Mental Status: She is alert.   Psychiatric:        Mood and Affect: Mood normal.        Thought Content: Thought content normal.        Judgment: Judgment normal.     Data: LEFT      PSV cm/sRatioStenosis       WaveformComments  +----------+--------+-----+---------------+--------+--------+  CFA Prox  169                         biphasic          +----------+--------+-----+---------------+--------+--------+  CFA Mid   161                         biphasic           +----------+--------+-----+---------------+--------+--------+  CFA Distal142                         biphasic          +----------+--------+-----+---------------+--------+--------+  DFA      117                         biphasic          +----------+--------+-----+---------------+--------+--------+  SFA Prox  130                         biphasic          +----------+--------+-----+---------------+--------+--------+  SFA Mid   114                         biphasic          +----------+--------+-----+---------------+--------+--------+  SFA Distal353          50-74% stenosisbiphasic          +----------+--------+-----+---------------+--------+--------+  POP Prox  123                         biphasic          +----------+--------+-----+---------------+--------+--------+  POP Mid   76  biphasic          +----------+--------+-----+---------------+--------+--------+   A focal velocity elevation of 353 cm/s was obtained at dist SFA with a VR  of 2.1. Findings are characteristic of 50-74% stenosis. Left SFA stent  struts not well visualized.   ABI Findings:  +---------+------------------+-----+---------+--------+  Right   Rt Pressure (mmHg)IndexWaveform Comment   +---------+------------------+-----+---------+--------+  Brachial 200                    biphasic           +---------+------------------+-----+---------+--------+  ATA     192               0.95 triphasic          +---------+------------------+-----+---------+--------+  PTA     195               0.97 triphasic          +---------+------------------+-----+---------+--------+  Great Toe133               0.66 Normal             +---------+------------------+-----+---------+--------+   +---------+------------------+-----+---------+-------+  Left    Lt Pressure (mmHg)IndexWaveform Comment   +---------+------------------+-----+---------+-------+  Brachial 202                    biphasic          +---------+------------------+-----+---------+-------+  ATA     182               0.90 triphasic         +---------+------------------+-----+---------+-------+  PTA     170               0.84 biphasic          +---------+------------------+-----+---------+-------+  Great Toe159               0.79 Normal            +---------+------------------+-----+---------+-------+   +-------+-----------+-----------+------------+------------+  ABI/TBIToday's ABIToday's TBIPrevious ABIPrevious TBI  +-------+-----------+-----------+------------+------------+  Right 0.97       0.66       1.02                      +-------+-----------+-----------+------------+------------+  Left  0.90       0.79       0.99                      +-------+-----------+-----------+------------+------------+       Right ABIs appear essentially unchanged. Left ABIs appear decreased.   See Arterial Duplex study    Summary:  Right: Resting right ankle-brachial index is within normal range. The  right toe-brachial index is abnormal.   Left: Resting left ankle-brachial index indicates mild left lower  extremity arterial disease. The left toe-brachial index is normal.         Assessment/Plan:     73 year old female with history of left SFA stenting now with preserved ABIs and stent appears patent with possible distal moderate stenosis that does not appear flow-limiting.  She has easily palpable femoral pulses and her pain all appears to be higher in the left hip and is unlikely to be related to vascular insufficiency.  As such she will continue dual antiplatelet therapy and statin and follow-up in 1 year with repeat noninvasive studies.     Adine Hoof MD [facility]

## 2024-02-08 ENCOUNTER — Other Ambulatory Visit (HOSPITAL_COMMUNITY): Payer: Self-pay | Admitting: Internal Medicine

## 2024-02-08 DIAGNOSIS — Z780 Asymptomatic menopausal state: Secondary | ICD-10-CM

## 2024-02-20 ENCOUNTER — Ambulatory Visit (INDEPENDENT_AMBULATORY_CARE_PROVIDER_SITE_OTHER)

## 2024-02-20 ENCOUNTER — Ambulatory Visit (INDEPENDENT_AMBULATORY_CARE_PROVIDER_SITE_OTHER): Admitting: Podiatry

## 2024-02-20 VITALS — Ht 65.0 in | Wt 205.0 lb

## 2024-02-20 DIAGNOSIS — I739 Peripheral vascular disease, unspecified: Secondary | ICD-10-CM | POA: Diagnosis not present

## 2024-02-20 DIAGNOSIS — M7752 Other enthesopathy of left foot: Secondary | ICD-10-CM

## 2024-02-20 DIAGNOSIS — L84 Corns and callosities: Secondary | ICD-10-CM

## 2024-02-20 NOTE — Patient Instructions (Addendum)
  VISIT SUMMARY: Today, you were seen for a tender callus on your left heel, which is a result of a previously healed wound. You also mentioned experiencing burning sensations in your right foot and have a history of back issues. We discussed your foot care and overall vascular health.  YOUR PLAN: -LEFT HEEL CALLUS FROM HEALED ULCER: The callus on your left heel is tender but the wound has healed. This is essentially scar tissue from the previous ulcer. We performed debridement to help with discomfort and to prevent the callus from coming back. The scar tissue should resolve in about a year. Please remove the Band-Aid to the callus in 12 hours,and wash the area with soap and water  before bed. Schedule a follow-up visit with Dr. Gaynel for regular diabetic and foot care in three months.  -PERIPHERAL VASCULAR DISEASE OF THE LOWER EXTREMITY: Peripheral vascular disease affects the circulation in your lower legs. Your left foot is warm and has good blood flow, with mild swelling. We will continue to monitor this condition.  -PLANTAR AND POSTERIOR CALCANEAL SPURS, LEFT FOOT: You have bone spurs on the bottom and back of your left heel. Surgery is not recommended due to the significant nature of the surgery and current management of the callus. We will focus on relieving pressure and maintaining the callus to prevent any new wounds.  INSTRUCTIONS: Please schedule a follow-up visit with Dr. Gaynel in three months for regular diabetic and foot care.                      Contains text generated by Abridge.                                 Contains text generated by Abridge.

## 2024-02-20 NOTE — Progress Notes (Signed)
 Subjective:  Patient ID: [name], female    DOB: 05-14-1951,  MRN: [id number]  Chief Complaint  Patient presents with   Foot Pain    Rm 1 Patient is here for left heel pain. Patient states an ulcer on the bottom off the left foot, patient states tenderness at the site of the ulcer.    Discussed the use of AI scribe software for clinical note transcription with the patient, who gave verbal consent to proceed.  History of Present Illness [name] is a 73 year old female who presents with a tender callus on the left heel. She was referred by Doctor Sheree from vascular surgery for foot care management.  She has a tender callus on the direct plantar surface of her left heel, originating from a previously healed wound. Despite the wound having healed, the area remains tender. She has a history of vascular procedures and has been under the care of Doctor Sheree.  She experiences burning sensations in her right foot, which she describes as 'burn, burn, burn'. She is aware of being on the borderline for diabetes, although she has not been formally diagnosed.  She has a history of back issues, specifically mentioning a 'sharp nerve' that was particularly severe last month, and she continues to feel its effects.      Objective:    Physical Exam VASCULAR: Left foot warm and well-perfused. Capillary fill time intact. DP and PT pulse non-palpable. DERMATOLOGIC: Normal skin turgor, texture, and temperature. Large callus from healed ulcer on plantar surface of left heel. No open lesions, rashes, or ulcerations. NEUROLOGIC: Normal sensation to light touch and pressure. No paresthesias. ORTHOPEDIC: Mild edema present. Plantar and posterior calcaneal spurring on radiographs, no infection. Smooth pain-free range of motion of all examined joints. No ecchymosis, bruising, or gross deformity. No pain to palpation.   No images are attached to the encounter.    Results RADIOLOGY Foot  X-ray: Plantar and posterior calcaneal spurring without evidence of infection (02/20/2024)  PROCEDURE Debridement of callus Description: The callus was debrided of hyperkeratosis with a scalpel.  Salinocaine ointment was applied.   Assessment:   1. Bone spur of left foot   2. Pre-ulcerative calluses   3. PAD (peripheral artery disease) (HCC)      Plan:  Patient was evaluated and treated and all questions answered.  Assessment and Plan Assessment & Plan Left heel callus from healed ulcer The left heel callus is tender but the wound is healed. It is essentially scar tissue from the previous ulcer. Debridement was performed to alleviate discomfort and prevent recurrence. The scar tissue is expected to resolve in about a year. - Applied salinocaine cream and a Band-Aid to the callus for 12 hours. - Remove the Band-Aid and wash the area with soap and water  before bed. - Schedule follow-up visit with Dr. Gaynel for regular diabetic and foot care in three months.  Peripheral vascular disease of the lower extremity Peripheral vascular disease contributes to circulation problems in the lower extremities. Left foot is warm and well perfused with mild edema, non-palpable DP and PT pulse, and intact capillary fill time.  Plantar and posterior calcaneal spurs, left foot Radiographs show plantar and posterior calcaneal spurs on the left foot. Surgical excision is not recommended due to the significant nature of the surgery and current management of the callus. Focus is on pressure relief and maintaining the callus to prevent wound recurrence.      Return in about 3 months (around  05/22/2024) for at risk diabetic foot care.

## 2024-02-23 ENCOUNTER — Ambulatory Visit (HOSPITAL_COMMUNITY)
Admission: RE | Admit: 2024-02-23 | Discharge: 2024-02-23 | Disposition: A | Discharge status: Home / Self Care | Source: Ambulatory Visit | Attending: Internal Medicine | Admitting: Internal Medicine

## 2024-02-23 DIAGNOSIS — Z780 Asymptomatic menopausal state: Secondary | ICD-10-CM | POA: Diagnosis present

## 2024-02-23 DIAGNOSIS — Z1231 Encounter for screening mammogram for malignant neoplasm of breast: Secondary | ICD-10-CM | POA: Insufficient documentation

## 2024-03-20 ENCOUNTER — Encounter (HOSPITAL_COMMUNITY): Payer: Self-pay | Admitting: Internal Medicine

## 2024-06-04 ENCOUNTER — Encounter: Payer: Self-pay | Admitting: Podiatry

## 2024-06-04 ENCOUNTER — Ambulatory Visit (INDEPENDENT_AMBULATORY_CARE_PROVIDER_SITE_OTHER): Admitting: Podiatry

## 2024-06-04 DIAGNOSIS — M79675 Pain in left toe(s): Secondary | ICD-10-CM | POA: Diagnosis not present

## 2024-06-04 DIAGNOSIS — E1151 Type 2 diabetes mellitus with diabetic peripheral angiopathy without gangrene: Secondary | ICD-10-CM

## 2024-06-04 DIAGNOSIS — I739 Peripheral vascular disease, unspecified: Secondary | ICD-10-CM | POA: Diagnosis not present

## 2024-06-04 DIAGNOSIS — M79674 Pain in right toe(s): Secondary | ICD-10-CM | POA: Diagnosis not present

## 2024-06-04 DIAGNOSIS — L84 Corns and callosities: Secondary | ICD-10-CM | POA: Diagnosis not present

## 2024-06-04 DIAGNOSIS — B351 Tinea unguium: Secondary | ICD-10-CM

## 2024-06-04 NOTE — Patient Instructions (Signed)
 Nerve Damage (Peripheral Neuropathy): What to Know Peripheral neuropathy happens when there's damage to the peripheral nerves. These nerves send signals between your spinal cord and your arms and legs. You can have damage in one or more nerves. What are the causes? There are many reasons why nerves can get damaged. Damage may be caused by some diseases, such as: Diabetes. This is the most common cause. Autoimmune diseases, such as rheumatoid arthritis or lupus. These happen when your body's defense system (immune system) attacks healthy parts of your body by mistake. Inherited nerve disease. This is passed down in families. Kidney disease. Thyroid disease. Other causes include: Pressure or stress on a nerve that lasts a long time. Lack of B vitamins from poor diet or drinking too much alcohol. Infections. Some medicines, like chemotherapy used for cancer treatment. Poisonous (toxic) substances, such as lead or mercury. Too little blood flowing to the legs. Sometimes, the cause isn't known. What are the signs or symptoms? Symptoms depend on which nerves are damaged. In your legs, hands, or arms, you may have: Loss of feeling (numbness). Tingling. Burning pain. Very sensitive skin. Weakness. Paralysis. This is when you can't move a part of your body. Clumsiness. Muscle twitching. Loss of balance. You may have trouble walking. Other symptoms can include: Not being able to control when you pee. Feeling dizzy. Problems during sex. How is this diagnosed? Your health care provider will: Ask about your symptoms and medical history. Do a physical exam. Do a neurological exam. They will check your reflexes, how you move, and what you can feel. You may need to have other tests, such as: Blood tests. Nerve tests to check how well your nerves and muscles work. Imaging tests, such as a CT scan or MRI. These are done to rule out other causes. Nerve biopsy. This is when a small piece of a  nerve is removed for testing. Lumbar puncture. A small amount of the fluid that surrounds the brain and spinal cord is taken and checked in a lab. How is this treated? Treatment may include: Treating the cause of the nerve damage. Pain medicines. Other medicines that may help with pain, such as: Medicines that treat seizures. Medicines that treat depression. Pain patches or creams that are put on painful areas of skin. TENS therapy. This uses a device that sends mild electrical pulses to your nerves. This will prevent pain signals from reaching the brain. Massage. Acupuncture. Surgery to relieve pressure on a nerve or to destroy a nerve that's causing pain. This is done only if the other treatments are not helping. You may also need physical therapy to help improve your movement, balance, and muscle strength. You may be told to use a cane or walker if needed. Follow these instructions at home: Medicines Take your medicines only as told. You may need to take steps to help treat or prevent trouble pooping (constipation), such as: Taking medicines to help you poop. Eating foods high in fiber, like beans, whole grains, and fresh fruits and vegetables. Drinking more fluids as told. Ask your provider if it's safe to drive or use machines while taking your medicine. Lifestyle  Do not smoke, vape, or use nicotine or tobacco. Avoid or limit alcohol. Too much alcohol can be harmful to nerves and cause damage. Eat a healthy diet. Safety  Take care to avoid burning or damaging your skin if you have numbness. If you have numbness in your feet: Check your feet each day for signs of injury  or infection. Watch for redness, warmth, and swelling. Wear padded socks and comfortable shoes. These help protect your feet. General instructions If you have diabetes, keep your blood sugar under control. Develop a good support system. Living with nerve damage can cause a lot of stress. Talk with a mental  health therapist or join a support group. Exercise as told. Ask what things are safe for you to do at home. Work with a pain specialist to find the best way to manage your pain. Keep all follow-up visits. Your provider will check if the treatments are working and change them if needed. Where to find support The Foundation for Peripheral Neuropathy: [employer]si Where to find more information To learn more, go to: [employer] of Neurological Disorders and Stroke at BasicFM[employer]no. Click Search and type peripheral neuropathy. Find the link you need. Contact a health care provider if: Your pain treatments are not working. Your symptoms are getting worse. You have side effects from any of your medicines. You have new symptoms. You're having a hard time coping with your condition. Get help right away if: You have a wound that's not healing. You have new weakness in an arm or leg. You've fallen or you fall often. This information is not intended to replace advice given to you by your health care provider. Make sure you discuss any questions you have with your health care provider. Document Revised: 10/05/2023 Document Reviewed: 10/05/2023 Elsevier Patient Education  2025 [employer].

## 2024-06-13 NOTE — Progress Notes (Signed)
  Subjective:  Patient ID: [name], female    DOB: [birth year],  MRN: [id number]  Kalei ROSSANA MOLCHAN presents to clinic today for at risk foot care. Pt has h/o NIDDM with PAD and preulcerative lesion(s) left heel and painful mycotic toenails that limit ambulation. Painful toenails interfere with ambulation. Aggravating factors include wearing enclosed shoe gear. Pain is relieved with periodic professional debridement. Painful preulcerative lesion(s) is/are aggravated when weightbearing with and without shoegear. Pain is relieved with periodic professional debridement. She is followed by Vascular Surgery for PAD. Chief Complaint  Patient presents with   Nail Problem    Thick painful toenails, 3 month follow up    New problem(s): None.   PCP is Austin Mutton, MD.  No Known Allergies  Review of Systems: Negative except as noted in the HPI.  Objective: No changes noted in today's physical examination. There were no vitals filed for this visit. [name] is a pleasant 73 y.o. female in NAD. AAO x 3.  .Vascular Examination: CFT <4 seconds b/l. DP pulses diminished b/l. PT pulses diminished b/l. Digital hair absent. Skin temperature gradient warm to cool b/l. No ischemia or gangrene. No cyanosis or clubbing noted b/l.    Neurological Examination: Pt has subjective symptoms of neuropathy. Sensation grossly intact b/l with 10 gram monofilament.  Dermatological Examination: Pedal skin thin, shiny and atrophic b/l. No open wounds. No interdigital macerations.   Toenails 1-5 b/l thick, discolored, elongated with subungual debris and pain on dorsal palpation.   Preulcerative lesion noted left heel. There is visible subdermal hemorrhage. There is no surrounding erythema, no edema, no drainage, no odor, no fluctuance.  Musculoskeletal Examination: Muscle strength 5/5 to all lower extremity muscle groups bilaterally. No pain, crepitus or joint limitation noted with ROM bilateral LE. Pes  planus deformity noted bilateral LE.  Radiographs: None  Assessment/Plan: 1. Pain due to onychomycosis of toenails of both feet   2. Pre-ulcerative calluses   3. Type II diabetes mellitus with peripheral circulatory disorder [facility])   Patient was evaluated and treated. All patient's and/or POA's questions/concerns addressed on today's visit. Dispensed information on peripheral neuropathy. Toenails 1-5 b/l debrided in length and girth without incident. Callus(es) plantar heel pad of left foot pared with sharp debridement without incident. Continue soft, supportive shoe gear daily. Report any pedal injuries to medical professional. Call office if there are any questions/concerns.  Return in about 3 months (around 09/04/2024).  Delon LITTIE Merlin, DPM      [facility] LOCATION: [address] [place] [address]                   Office [phone]   [facility] LOCATION: [address] [place], [place] [address] Office [phone]

## 2024-09-18 ENCOUNTER — Ambulatory Visit: Admitting: Podiatry

## 2024-12-17 ENCOUNTER — Telehealth: Admitting: Diagnostic Neuroimaging
